# Patient Record
Sex: Female | Born: 1950 | Race: Asian | Hispanic: No | Marital: Married | Smoking: Never smoker
Health system: Southern US, Community
[De-identification: ages and names within clinical notes are randomized; demographics above are authoritative.]

## PROBLEM LIST (undated history)

## (undated) DIAGNOSIS — G4700 Insomnia, unspecified: Secondary | ICD-10-CM

## (undated) DIAGNOSIS — F419 Anxiety disorder, unspecified: Secondary | ICD-10-CM

## (undated) DIAGNOSIS — R748 Abnormal levels of other serum enzymes: Secondary | ICD-10-CM

## (undated) DIAGNOSIS — G43909 Migraine, unspecified, not intractable, without status migrainosus: Secondary | ICD-10-CM

## (undated) DIAGNOSIS — E785 Hyperlipidemia, unspecified: Secondary | ICD-10-CM

## (undated) DIAGNOSIS — R519 Headache, unspecified: Secondary | ICD-10-CM

## (undated) DIAGNOSIS — K219 Gastro-esophageal reflux disease without esophagitis: Secondary | ICD-10-CM

## (undated) DIAGNOSIS — N760 Acute vaginitis: Secondary | ICD-10-CM

## (undated) DIAGNOSIS — H9190 Unspecified hearing loss, unspecified ear: Secondary | ICD-10-CM

## (undated) DIAGNOSIS — I671 Cerebral aneurysm, nonruptured: Secondary | ICD-10-CM

## (undated) DIAGNOSIS — Z974 Presence of external hearing-aid: Secondary | ICD-10-CM

## (undated) DIAGNOSIS — K760 Fatty (change of) liver, not elsewhere classified: Secondary | ICD-10-CM

## (undated) DIAGNOSIS — R4584 Anhedonia: Secondary | ICD-10-CM

## (undated) DIAGNOSIS — Z973 Presence of spectacles and contact lenses: Secondary | ICD-10-CM

## (undated) DIAGNOSIS — M1990 Unspecified osteoarthritis, unspecified site: Secondary | ICD-10-CM

## (undated) DIAGNOSIS — I729 Aneurysm of unspecified site: Secondary | ICD-10-CM

## (undated) DIAGNOSIS — I2510 Atherosclerotic heart disease of native coronary artery without angina pectoris: Secondary | ICD-10-CM

## (undated) DIAGNOSIS — E039 Hypothyroidism, unspecified: Secondary | ICD-10-CM

## (undated) DIAGNOSIS — F32A Depression, unspecified: Secondary | ICD-10-CM

## (undated) DIAGNOSIS — I10 Essential (primary) hypertension: Secondary | ICD-10-CM

## (undated) HISTORY — DX: Gastro-esophageal reflux disease without esophagitis: K21.9

## (undated) HISTORY — DX: Migraine, unspecified, not intractable, without status migrainosus: G43.909

## (undated) HISTORY — DX: Fatty (change of) liver, not elsewhere classified: K76.0

## (undated) HISTORY — DX: Anhedonia: R45.84

## (undated) HISTORY — DX: Hypothyroidism, unspecified: E03.9

## (undated) HISTORY — DX: Insomnia, unspecified: G47.00

## (undated) HISTORY — DX: Unspecified osteoarthritis, unspecified site: M19.90

## (undated) HISTORY — DX: Cerebral aneurysm, nonruptured: I67.1

## (undated) HISTORY — DX: Hyperlipidemia, unspecified: E78.5

## (undated) HISTORY — DX: Acute vaginitis: N76.0

## (undated) HISTORY — DX: Unspecified hearing loss, unspecified ear: H91.90

## (undated) HISTORY — DX: Abnormal levels of other serum enzymes: R74.8

## (undated) HISTORY — DX: Essential (primary) hypertension: I10

## (undated) HISTORY — DX: Depression, unspecified: F32.A

## (undated) HISTORY — PX: BREAST BIOPSY: SHX20

---

## 2001-07-22 ENCOUNTER — Encounter: Payer: Self-pay | Admitting: Family Medicine

## 2001-07-22 ENCOUNTER — Ambulatory Visit (HOSPITAL_COMMUNITY): Admission: RE | Admit: 2001-07-22 | Discharge: 2001-07-22 | Payer: Self-pay | Admitting: Family Medicine

## 2001-08-15 ENCOUNTER — Other Ambulatory Visit: Admission: RE | Admit: 2001-08-15 | Discharge: 2001-08-15 | Payer: Self-pay | Admitting: Family Medicine

## 2001-08-18 ENCOUNTER — Encounter: Payer: Self-pay | Admitting: Family Medicine

## 2001-08-18 ENCOUNTER — Ambulatory Visit (HOSPITAL_COMMUNITY): Admission: RE | Admit: 2001-08-18 | Discharge: 2001-08-18 | Payer: Self-pay | Admitting: Family Medicine

## 2003-07-14 ENCOUNTER — Ambulatory Visit (HOSPITAL_COMMUNITY): Admission: RE | Admit: 2003-07-14 | Discharge: 2003-07-14 | Payer: Self-pay | Admitting: Family Medicine

## 2003-07-14 ENCOUNTER — Encounter: Payer: Self-pay | Admitting: Family Medicine

## 2003-07-21 ENCOUNTER — Encounter: Payer: Self-pay | Admitting: Family Medicine

## 2003-07-21 ENCOUNTER — Ambulatory Visit (HOSPITAL_COMMUNITY): Admission: RE | Admit: 2003-07-21 | Discharge: 2003-07-21 | Payer: Self-pay | Admitting: Family Medicine

## 2004-09-06 ENCOUNTER — Ambulatory Visit: Payer: Self-pay | Admitting: Family Medicine

## 2004-09-07 ENCOUNTER — Ambulatory Visit (HOSPITAL_COMMUNITY): Admission: RE | Admit: 2004-09-07 | Discharge: 2004-09-07 | Payer: Self-pay | Admitting: Family Medicine

## 2004-09-13 ENCOUNTER — Ambulatory Visit (HOSPITAL_COMMUNITY): Admission: RE | Admit: 2004-09-13 | Discharge: 2004-09-13 | Payer: Self-pay | Admitting: Family Medicine

## 2004-09-21 ENCOUNTER — Ambulatory Visit: Payer: Self-pay | Admitting: Family Medicine

## 2004-10-26 ENCOUNTER — Ambulatory Visit: Payer: Self-pay | Admitting: Family Medicine

## 2004-11-02 ENCOUNTER — Ambulatory Visit (HOSPITAL_COMMUNITY): Admission: RE | Admit: 2004-11-02 | Discharge: 2004-11-02 | Payer: Self-pay | Admitting: Family Medicine

## 2005-01-22 ENCOUNTER — Ambulatory Visit: Payer: Self-pay | Admitting: Family Medicine

## 2005-03-05 ENCOUNTER — Ambulatory Visit: Payer: Self-pay | Admitting: Family Medicine

## 2005-04-05 ENCOUNTER — Ambulatory Visit (HOSPITAL_COMMUNITY): Admission: RE | Admit: 2005-04-05 | Discharge: 2005-04-05 | Payer: Self-pay | Admitting: General Surgery

## 2005-04-25 ENCOUNTER — Ambulatory Visit (HOSPITAL_COMMUNITY): Admission: RE | Admit: 2005-04-25 | Discharge: 2005-04-25 | Payer: Self-pay | Admitting: Family Medicine

## 2005-04-26 ENCOUNTER — Encounter: Admission: RE | Admit: 2005-04-26 | Discharge: 2005-04-26 | Payer: Self-pay | Admitting: Family Medicine

## 2005-04-26 ENCOUNTER — Encounter (INDEPENDENT_AMBULATORY_CARE_PROVIDER_SITE_OTHER): Payer: Self-pay | Admitting: *Deleted

## 2005-09-27 ENCOUNTER — Ambulatory Visit: Payer: Self-pay | Admitting: Family Medicine

## 2005-10-02 ENCOUNTER — Ambulatory Visit: Payer: Self-pay | Admitting: Family Medicine

## 2005-11-27 ENCOUNTER — Ambulatory Visit: Payer: Self-pay | Admitting: Family Medicine

## 2005-12-10 ENCOUNTER — Ambulatory Visit: Payer: Self-pay | Admitting: Family Medicine

## 2005-12-11 ENCOUNTER — Ambulatory Visit: Payer: Self-pay | Admitting: Family Medicine

## 2005-12-26 ENCOUNTER — Ambulatory Visit: Payer: Self-pay | Admitting: Family Medicine

## 2006-01-09 ENCOUNTER — Ambulatory Visit (HOSPITAL_COMMUNITY): Admission: RE | Admit: 2006-01-09 | Discharge: 2006-01-09 | Payer: Self-pay

## 2006-01-28 ENCOUNTER — Ambulatory Visit: Payer: Self-pay | Admitting: Family Medicine

## 2006-08-14 ENCOUNTER — Ambulatory Visit: Payer: Self-pay | Admitting: Family Medicine

## 2006-09-05 ENCOUNTER — Other Ambulatory Visit: Admission: RE | Admit: 2006-09-05 | Discharge: 2006-09-05 | Payer: Self-pay | Admitting: Family Medicine

## 2006-09-05 ENCOUNTER — Ambulatory Visit: Payer: Self-pay | Admitting: Family Medicine

## 2006-12-19 ENCOUNTER — Ambulatory Visit: Payer: Self-pay | Admitting: Family Medicine

## 2006-12-20 ENCOUNTER — Encounter: Payer: Self-pay | Admitting: Family Medicine

## 2006-12-20 LAB — CONVERTED CEMR LAB: TSH: 1.563 microintl units/mL (ref 0.350–5.50)

## 2007-01-13 ENCOUNTER — Ambulatory Visit (HOSPITAL_COMMUNITY): Admission: RE | Admit: 2007-01-13 | Discharge: 2007-01-13 | Payer: Self-pay | Admitting: Family Medicine

## 2007-04-01 ENCOUNTER — Ambulatory Visit: Payer: Self-pay | Admitting: Family Medicine

## 2007-04-01 LAB — CONVERTED CEMR LAB: TSH: 0.923 microintl units/mL (ref 0.350–5.50)

## 2007-07-15 ENCOUNTER — Ambulatory Visit: Payer: Self-pay | Admitting: Family Medicine

## 2007-07-16 ENCOUNTER — Encounter: Payer: Self-pay | Admitting: Family Medicine

## 2007-09-11 ENCOUNTER — Other Ambulatory Visit: Admission: RE | Admit: 2007-09-11 | Discharge: 2007-09-11 | Payer: Self-pay | Admitting: Family Medicine

## 2007-09-11 ENCOUNTER — Encounter: Payer: Self-pay | Admitting: Family Medicine

## 2007-09-11 ENCOUNTER — Ambulatory Visit: Payer: Self-pay | Admitting: Family Medicine

## 2007-09-11 LAB — CONVERTED CEMR LAB
Basophils Absolute: 0.1 10*3/uL (ref 0.0–0.1)
Basophils Relative: 1 % (ref 0–1)
Chloride: 106 meq/L (ref 96–112)
Creatinine, Ser: 0.68 mg/dL (ref 0.40–1.20)
HDL: 60 mg/dL (ref >39)
LDL Cholesterol: 150 mg/dL — ABNORMAL HIGH (ref 0–99)
MCHC: 33.1 g/dL (ref 30.0–36.0)
Monocytes Absolute: 0.5 10*3/uL (ref 0.1–1.0)
Neutro Abs: 2.4 10*3/uL (ref 1.7–7.7)
Neutrophils Relative %: 52 % (ref 43–77)
Platelets: 340 10*3/uL (ref 150–400)
RDW: 14 % (ref 11.5–15.5)
Sodium: 144 meq/L (ref 135–145)
Triglycerides: 102 mg/dL (ref <150)

## 2007-10-02 ENCOUNTER — Encounter: Payer: Self-pay | Admitting: Family Medicine

## 2008-02-25 ENCOUNTER — Ambulatory Visit (HOSPITAL_COMMUNITY): Admission: RE | Admit: 2008-02-25 | Discharge: 2008-02-25 | Payer: Self-pay | Admitting: Family Medicine

## 2008-03-31 ENCOUNTER — Ambulatory Visit: Payer: Self-pay | Admitting: Family Medicine

## 2008-04-01 ENCOUNTER — Encounter: Payer: Self-pay | Admitting: Family Medicine

## 2008-04-01 LAB — CONVERTED CEMR LAB
TSH: 2.916 microintl units/mL (ref 0.350–4.50)
Total CHOL/HDL Ratio: 3.8
VLDL: 14 mg/dL (ref 0–40)

## 2008-04-02 ENCOUNTER — Encounter: Payer: Self-pay | Admitting: Family Medicine

## 2008-04-02 LAB — CONVERTED CEMR LAB
Candida species: NEGATIVE
Chlamydia, DNA Probe: NEGATIVE

## 2008-04-05 ENCOUNTER — Encounter: Payer: Self-pay | Admitting: Family Medicine

## 2008-04-05 LAB — CONVERTED CEMR LAB
ALT: 13 units/L (ref 0–35)
Alkaline Phosphatase: 60 units/L (ref 39–117)
Total Protein: 7.1 g/dL (ref 6.0–8.3)

## 2008-04-09 ENCOUNTER — Encounter: Payer: Self-pay | Admitting: Family Medicine

## 2008-04-09 DIAGNOSIS — E039 Hypothyroidism, unspecified: Secondary | ICD-10-CM | POA: Insufficient documentation

## 2008-04-09 DIAGNOSIS — F458 Other somatoform disorders: Secondary | ICD-10-CM | POA: Insufficient documentation

## 2008-04-09 DIAGNOSIS — N3000 Acute cystitis without hematuria: Secondary | ICD-10-CM

## 2008-04-09 DIAGNOSIS — I10 Essential (primary) hypertension: Secondary | ICD-10-CM

## 2008-04-09 DIAGNOSIS — G4700 Insomnia, unspecified: Secondary | ICD-10-CM | POA: Insufficient documentation

## 2008-04-09 DIAGNOSIS — N760 Acute vaginitis: Secondary | ICD-10-CM | POA: Insufficient documentation

## 2008-04-09 DIAGNOSIS — E785 Hyperlipidemia, unspecified: Secondary | ICD-10-CM

## 2008-05-05 ENCOUNTER — Encounter: Payer: Self-pay | Admitting: Family Medicine

## 2008-06-17 ENCOUNTER — Ambulatory Visit: Payer: Self-pay | Admitting: Family Medicine

## 2008-06-17 DIAGNOSIS — L259 Unspecified contact dermatitis, unspecified cause: Secondary | ICD-10-CM

## 2008-07-05 ENCOUNTER — Telehealth: Payer: Self-pay | Admitting: Family Medicine

## 2008-07-07 ENCOUNTER — Encounter: Payer: Self-pay | Admitting: Family Medicine

## 2008-07-09 ENCOUNTER — Telehealth: Payer: Self-pay | Admitting: Family Medicine

## 2008-07-13 ENCOUNTER — Telehealth: Payer: Self-pay | Admitting: Family Medicine

## 2008-07-14 ENCOUNTER — Encounter: Payer: Self-pay | Admitting: Family Medicine

## 2008-08-09 ENCOUNTER — Ambulatory Visit: Payer: Self-pay | Admitting: Family Medicine

## 2008-08-09 DIAGNOSIS — L049 Acute lymphadenitis, unspecified: Secondary | ICD-10-CM | POA: Insufficient documentation

## 2008-08-09 DIAGNOSIS — R519 Headache, unspecified: Secondary | ICD-10-CM | POA: Insufficient documentation

## 2008-08-09 DIAGNOSIS — R51 Headache: Secondary | ICD-10-CM

## 2008-08-15 DIAGNOSIS — E739 Lactose intolerance, unspecified: Secondary | ICD-10-CM

## 2008-08-16 ENCOUNTER — Encounter: Payer: Self-pay | Admitting: Family Medicine

## 2008-09-02 ENCOUNTER — Ambulatory Visit (HOSPITAL_COMMUNITY): Admission: RE | Admit: 2008-09-02 | Discharge: 2008-09-02 | Payer: Self-pay | Admitting: Family Medicine

## 2008-09-02 ENCOUNTER — Ambulatory Visit: Payer: Self-pay | Admitting: Family Medicine

## 2008-09-02 DIAGNOSIS — J42 Unspecified chronic bronchitis: Secondary | ICD-10-CM

## 2009-01-05 ENCOUNTER — Ambulatory Visit: Payer: Self-pay | Admitting: Family Medicine

## 2009-01-05 LAB — CONVERTED CEMR LAB
Bilirubin Urine: NEGATIVE
Ketones, urine, test strip: NEGATIVE
Protein, U semiquant: >=300
Urobilinogen, UA: 0.2

## 2009-01-06 ENCOUNTER — Encounter (INDEPENDENT_AMBULATORY_CARE_PROVIDER_SITE_OTHER): Payer: Self-pay | Admitting: Family Medicine

## 2009-01-25 ENCOUNTER — Encounter: Payer: Self-pay | Admitting: Family Medicine

## 2009-03-01 ENCOUNTER — Ambulatory Visit: Payer: Self-pay | Admitting: Family Medicine

## 2009-03-01 ENCOUNTER — Encounter: Payer: Self-pay | Admitting: Family Medicine

## 2009-03-01 ENCOUNTER — Other Ambulatory Visit: Admission: RE | Admit: 2009-03-01 | Discharge: 2009-03-01 | Payer: Self-pay | Admitting: Family Medicine

## 2009-03-01 DIAGNOSIS — R079 Chest pain, unspecified: Secondary | ICD-10-CM

## 2009-03-02 ENCOUNTER — Encounter: Payer: Self-pay | Admitting: Family Medicine

## 2009-03-02 LAB — CONVERTED CEMR LAB
Gardnerella vaginalis: NEGATIVE
Trichomonal Vaginitis: NEGATIVE

## 2009-03-04 LAB — CONVERTED CEMR LAB
ALT: 14 units/L (ref 0–35)
AST: 22 units/L (ref 0–37)
Alkaline Phosphatase: 53 units/L (ref 39–117)
BUN: 19 mg/dL (ref 6–23)
Basophils Absolute: 0 10*3/uL (ref 0.0–0.1)
Calcium: 9.4 mg/dL (ref 8.4–10.5)
Cholesterol: 185 mg/dL (ref 0–200)
Eosinophils Relative: 3 % (ref 0–5)
Indirect Bilirubin: 0.3 mg/dL (ref 0.0–0.9)
Lymphocytes Relative: 33 % (ref 12–46)
Neutro Abs: 2.6 10*3/uL (ref 1.7–7.7)
Platelets: 347 10*3/uL (ref 150–400)
Potassium: 3.3 meq/L — ABNORMAL LOW (ref 3.5–5.3)
RDW: 13.4 % (ref 11.5–15.5)
Total Protein: 7.2 g/dL (ref 6.0–8.3)
Triglycerides: 87 mg/dL (ref <150)
VLDL: 17 mg/dL (ref 0–40)

## 2009-08-19 ENCOUNTER — Ambulatory Visit: Payer: Self-pay | Admitting: Family Medicine

## 2009-08-19 LAB — CONVERTED CEMR LAB
Glucose, Urine, Semiquant: NEGATIVE
Nitrite: NEGATIVE
Protein, U semiquant: NEGATIVE
Urobilinogen, UA: 0.2

## 2009-08-20 ENCOUNTER — Encounter: Payer: Self-pay | Admitting: Family Medicine

## 2009-08-26 ENCOUNTER — Ambulatory Visit (HOSPITAL_COMMUNITY): Admission: RE | Admit: 2009-08-26 | Discharge: 2009-08-26 | Payer: Self-pay | Admitting: Family Medicine

## 2009-08-29 ENCOUNTER — Encounter: Payer: Self-pay | Admitting: Family Medicine

## 2009-09-09 ENCOUNTER — Ambulatory Visit: Payer: Self-pay | Admitting: Family Medicine

## 2009-09-09 DIAGNOSIS — H547 Unspecified visual loss: Secondary | ICD-10-CM

## 2009-09-09 LAB — CONVERTED CEMR LAB
Blood in Urine, dipstick: NEGATIVE
Ketones, urine, test strip: NEGATIVE
Nitrite: NEGATIVE
Protein, U semiquant: NEGATIVE
Urobilinogen, UA: 0.2
WBC Urine, dipstick: NEGATIVE

## 2009-09-20 ENCOUNTER — Encounter: Payer: Self-pay | Admitting: Family Medicine

## 2009-12-16 ENCOUNTER — Ambulatory Visit: Payer: Self-pay | Admitting: Family Medicine

## 2009-12-16 DIAGNOSIS — K219 Gastro-esophageal reflux disease without esophagitis: Secondary | ICD-10-CM

## 2009-12-19 ENCOUNTER — Encounter: Payer: Self-pay | Admitting: Family Medicine

## 2009-12-23 ENCOUNTER — Ambulatory Visit: Payer: Self-pay | Admitting: Family Medicine

## 2010-01-26 ENCOUNTER — Ambulatory Visit: Payer: Self-pay | Admitting: Family Medicine

## 2010-01-26 DIAGNOSIS — J309 Allergic rhinitis, unspecified: Secondary | ICD-10-CM | POA: Insufficient documentation

## 2010-01-31 ENCOUNTER — Telehealth: Payer: Self-pay | Admitting: Physician Assistant

## 2010-04-12 ENCOUNTER — Ambulatory Visit: Payer: Self-pay | Admitting: Gastroenterology

## 2010-04-12 DIAGNOSIS — K5900 Constipation, unspecified: Secondary | ICD-10-CM | POA: Insufficient documentation

## 2010-04-12 DIAGNOSIS — K3189 Other diseases of stomach and duodenum: Secondary | ICD-10-CM

## 2010-04-12 DIAGNOSIS — R1013 Epigastric pain: Secondary | ICD-10-CM

## 2010-04-14 ENCOUNTER — Encounter: Payer: Self-pay | Admitting: Gastroenterology

## 2010-04-17 ENCOUNTER — Ambulatory Visit (HOSPITAL_COMMUNITY): Admission: RE | Admit: 2010-04-17 | Discharge: 2010-04-17 | Payer: Self-pay | Admitting: Gastroenterology

## 2010-04-17 ENCOUNTER — Ambulatory Visit: Payer: Self-pay | Admitting: Gastroenterology

## 2010-04-20 ENCOUNTER — Encounter: Payer: Self-pay | Admitting: Gastroenterology

## 2010-06-06 ENCOUNTER — Telehealth: Payer: Self-pay | Admitting: Family Medicine

## 2010-07-13 ENCOUNTER — Encounter (INDEPENDENT_AMBULATORY_CARE_PROVIDER_SITE_OTHER): Payer: Self-pay | Admitting: *Deleted

## 2010-08-18 ENCOUNTER — Other Ambulatory Visit: Admission: RE | Admit: 2010-08-18 | Discharge: 2010-08-18 | Payer: Self-pay | Admitting: Family Medicine

## 2010-08-18 ENCOUNTER — Ambulatory Visit: Payer: Self-pay | Admitting: Family Medicine

## 2010-08-19 ENCOUNTER — Encounter: Payer: Self-pay | Admitting: Family Medicine

## 2010-08-20 DIAGNOSIS — L299 Pruritus, unspecified: Secondary | ICD-10-CM | POA: Insufficient documentation

## 2010-08-22 ENCOUNTER — Encounter: Payer: Self-pay | Admitting: Family Medicine

## 2010-08-22 LAB — CONVERTED CEMR LAB: Pap Smear: NEGATIVE

## 2010-08-28 ENCOUNTER — Telehealth: Payer: Self-pay | Admitting: Family Medicine

## 2010-08-29 ENCOUNTER — Telehealth: Payer: Self-pay | Admitting: Family Medicine

## 2010-08-29 ENCOUNTER — Ambulatory Visit (HOSPITAL_COMMUNITY): Admission: RE | Admit: 2010-08-29 | Discharge: 2010-08-29 | Payer: Self-pay | Admitting: Family Medicine

## 2010-10-22 ENCOUNTER — Encounter: Payer: Self-pay | Admitting: Family Medicine

## 2010-10-29 LAB — CONVERTED CEMR LAB
Chlamydia, DNA Probe: NEGATIVE
GC Probe Amp, Genital: NEGATIVE

## 2010-10-31 NOTE — Assessment & Plan Note (Signed)
Summary: blood pressure follow up- room 2   Vital Signs:  Patient profile:   60 year old female Menstrual status:  perimenopausal Height:      62.5 inches Weight:      142.50 pounds BMI:     25.74 O2 Sat:      97 % on Room air Pulse rate:   85 / minute Resp:     16 per minute BP sitting:   164 / 90  (left arm)  Vitals Entered By: Adella Hare LPN (December 23, 2009 8:47 AM) CC: blood pressure follow up Is Patient Diabetic? No Pain Assessment Patient in pain? no        Primary Provider:  Syliva Overman MD  CC:  blood pressure follow up.  History of Present Illness: Pt is here today for f/u of your blood pressure.   she brought her monitor with her today, & her BP on her monitor was close to the BP reading in the office. She also brought her daily BP reading from home x the last 1 week.  BP                     P 12/76               84 115/79             92 124/80             90 120/76             79 114/78             86 108/82             76   Pt denies chest pain since her prev visit. Has an appt with GI 4/1.   "I have heartburn every day."  But is better with meds than w/o.  Current Medications (verified): 1)  Nasonex 50 Mcg/act Susp (Mometasone Furoate) .... Two Puffs Per Nostril Qd 2)  Maxzide-25 37.5-25 Mg Tabs (Triamterene-Hctz) .... Take 1 Tablet By Mouth Once A Day 3)  Temazepam 15 Mg Caps (Temazepam) .... Take 1 Tab By Mouth At Bedtime 4)  Klor-Con M20 20 Meq Cr-Tabs (Potassium Chloride Crys Cr) .... One Tab By Mouth Qd 5)  Calcium-Magnesium-Zinc 333-133-8.3 Mg Tabs (Calcium-Magnesium-Zinc) .... Take 1 Tablet By Mouth Once A Day 6)  Vitamin C 1000 Mg Tabs (Ascorbic Acid) .... Take 1 Tablet By Mouth Once A Day 7)  Fish Oil 1000 Mg Caps (Omega-3 Fatty Acids) .... Take 1 Tablet By Mouth Once A Day 8)  Aspir-Low 81 Mg Tbec (Aspirin) .... Take 1 Tablet By Mouth Once A Day 9)  Vitamin E 400 Unit Caps (Vitamin E) .... Take 1 Tablet By Mouth Once A Day 10)   Imipramine Hcl 10 Mg Tabs (Imipramine Hcl) .... Take 1 Tab By Mouth At Bedtime 11)  Omeprazole 20 Mg Cpdr (Omeprazole) .... Take 1 Capsule By Mouth Once A Day 12)  Hydroxyzine Hcl 25 Mg Tabs (Hydroxyzine Hcl) .... Take One Every 8 Hrs As Needed For Itching  Allergies (verified): No Known Drug Allergies  Past History:  Past medical history reviewed for relevance to current acute and chronic problems.  Past Medical History: Reviewed history from 12/16/2009 and no changes required. 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 CV:  Denies chest pain or discomfort and palpitations. Resp:  Denies shortness of breath. GI:  Complains of indigestion; denies abdominal pain, nausea, and vomiting.  Physical Exam  General:  Well-developed,well-nourished,in no acute distress; alert,appropriate and cooperative throughout examination Head:  Normocephalic and atraumatic without obvious abnormalities. No apparent alopecia or balding. Ears:  External ear exam shows no significant lesions or deformities.  Otoscopic examination reveals clear canals, tympanic membranes are intact bilaterally without bulging, retraction, inflammation or discharge. Hearing is grossly normal bilaterally. Nose:  External nasal examination shows no deformity or inflammation. Nasal mucosa are pink and moist without lesions or exudates. Mouth:  Oral mucosa and oropharynx without lesions or exudates.  Teeth in good repair. Neck:  No deformities, masses, or tenderness noted. Lungs:  Normal respiratory effort, chest expands symmetrically. Lungs are clear to auscultation, no crackles or wheezes. Heart:  Normal rate and regular rhythm. S1 and S2 normal without gallop, murmur, click, rub or other extra sounds. Cervical Nodes:  No lymphadenopathy  noted Psych:  Cognition and judgment appear intact. Alert and cooperative with normal attention span and concentration. No apparent delusions, illusions, hallucinations   Impression & Recommendations:  Problem # 1:  HYPERTENSION (ICD-401.9) Assessment Improved Pts BP is elevated in the office but is controlled at home. No adjustments to meds at this time.  Her updated medication list for this problem includes:    Maxzide-25 37.5-25 Mg Tabs (Triamterene-hctz) .Marland Kitchen... Take 1 tablet by mouth once a day  Her updated medication list for this problem includes:    Maxzide-25 37.5-25 Mg Tabs (Triamterene-hctz) .Marland Kitchen... Take 1 tablet by mouth once a day  Problem # 2:  GERD (ICD-530.81) Assessment: Unchanged  Her updated medication list for this problem includes:    Omeprazole 20 Mg Cpdr (Omeprazole) .Marland Kitchen... Take 1 capsule by mouth once a day  Her updated medication list for this problem includes:    Omeprazole 20 Mg Cpdr (Omeprazole) .Marland Kitchen... Take 1 capsule by mouth once a day  Complete Medication List: 1)  Nasonex 50 Mcg/act Susp (Mometasone furoate) .... Two puffs per nostril qd 2)  Maxzide-25 37.5-25 Mg Tabs (Triamterene-hctz) .... Take 1 tablet by mouth once a day 3)  Temazepam 15 Mg Caps (Temazepam) .... Take 1 tab by mouth at bedtime 4)  Klor-con M20 20 Meq Cr-tabs (Potassium chloride crys cr) .... One tab by mouth qd 5)  Calcium-magnesium-zinc 333-133-8.3 Mg Tabs (Calcium-magnesium-zinc) .... Take 1 tablet by mouth once a day 6)  Vitamin C 1000 Mg Tabs (Ascorbic acid) .... Take 1 tablet by mouth once a day 7)  Fish Oil 1000 Mg Caps (Omega-3 fatty acids) .... Take 1 tablet by mouth once a day 8)  Aspir-low 81 Mg Tbec (Aspirin) .... Take 1 tablet by mouth once a day 9)  Vitamin E 400 Unit Caps (Vitamin e) .... Take 1 tablet by mouth once a day 10)  Imipramine Hcl 10 Mg Tabs (Imipramine hcl) .... Take 1 tab by mouth at bedtime 11)  Omeprazole 20 Mg Cpdr (Omeprazole) .... Take 1 capsule by mouth  once a day 12)  Hydroxyzine Hcl 25 Mg Tabs (Hydroxyzine hcl) .... Take one every 8 hrs as needed for itching  Patient Instructions: 1)  Please schedule a follow-up appointment in 3 months. 2)  Check your Blood Pressure regularly. If it is above 160/100 at home you should make an appointment. 3)  Continue your current medications. 4)  Keep your appt with the GI doctor.

## 2010-10-31 NOTE — Letter (Signed)
Summary: Pap Smear, Normal Letter, [facility]  [address]   [place], [place] [address]   Phone: [phone]  Fax: [phone]          August 22, 2010    Dear: Tanya [name]    I am pleased to notify you that your PAP smear was normal.  You will need your next PAP smear in:     ____ 3 Months    ____ 6 Months    ____ 12 Months    Please call the office at our office number above, to schedule your next appointment.    Sincerely,     [facility]

## 2010-10-31 NOTE — Progress Notes (Signed)
  Phone Note Call from Patient   Caller: Patient Summary of Call: patient states dr didnt prescribe anything for her fungal infection [facility]co Initial call taken by: Adella Hare LPN,  August 29, 2010 9:08 AM  Follow-up for Phone Call        pls explain thwe generalised itch is due to allergies and tablet prescribed for this, no sign of fungal skin infection on exam Follow-up by: Syliva Overman MD,  August 29, 2010 12:25 PM  Additional Follow-up for Phone Call Additional follow up Details #1::        called patient, left message Additional Follow-up by: Adella Hare LPN,  August 29, 2010 5:32 PM    Additional Follow-up for Phone Call Additional follow up Details #2::    called patient, left message Follow-up by: Adella Hare LPN,  August 30, 2010 3:59 PM   Appended Document:  patient aware

## 2010-10-31 NOTE — Assessment & Plan Note (Signed)
Summary: DYSPEPSIA, CONSTIPATION   Visit Type:  Follow-up Visit Primary Care Provider:  Lodema Hong, M.D.   Chief Complaint:  gerd and constipation.  History of Present Illness: Gas and heartburn x4-6 mos. BMs: can have none for 2-3 days, straining, and hard balls. Sometimes uses Vaseline in rectum. Heartburn: in epigastrium associated with severe nausea, no vomiting, pain: chest and epigastrium. Taking Prilosec 1-3 times a week,  and it helps most of the time: ~3 mos. uses OTC TUMS and it helps better. No EGD but had TCS 6 years ago. No problems swallowing, blood in stool, or black tarry stools. Drinks water during day. Eats fruits and vegetables.  Constipation a problem for months. 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 Onset is random. Worse in the Am when she eats breakfast. ASA every day:  > 1 years for stroke prevention. Uses Advil sometimes: 2x/month. Worried about gas and constipation. Rare regugritates acid in the middle of the night.  Current Medications (verified): 1)  Maxzide-25 37.5-25 Mg Tabs (Triamterene-Hctz) .... Take 1 Tablet By Mouth Once A Day 2)  Temazepam 15 Mg Caps (Temazepam) .... Take 1 Tab By Mouth At Bedtime 3)  Vitamin C 1000 Mg Tabs (Ascorbic Acid) .... Take 1 Tablet By Mouth Once A Day 4)  Fish Oil 1000 Mg Caps (Omega-3 Fatty Acids) .... Take 1 Tablet By Mouth Once A Day 5)  Aspir-Low 81 Mg Tbec (Aspirin) .... Take 1 Tablet By Mouth Once A Day 6)  Vitamin E 400 Unit Caps (Vitamin E) .... Take 1 Tablet By Mouth Once A Day 7)  Omeprazole 20 Mg Cpdr (Omeprazole) .... 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 No kids. Originally from [place]: in [place] for  ~ 20 years Never Smoked Alcohol use-no Drug use-no  Vital Signs:  Patient profile:   60 year old female Menstrual status:  perimenopausal Height:      62.5 inches Weight:      135 pounds BMI:     24.39 Temp:     98.9 degrees F oral Pulse rate:   80 / minute BP sitting:   118 / 88  (left arm) Cuff size:   regular  Vitals Entered By: Hendricks Limes LPN (April 12, 2010 3:37 PM)  Physical Exam  General:  Well developed, well nourished, no acute distress. Head:  Normocephalic and atraumatic. Eyes:  PERRLA, no icterus. Mouth:  No deformity or lesions. Neck:  Supple; no masses. Lungs:  Clear throughout to auscultation. Heart:  Regular rate and rhythm; no murmurs. Abdomen:  Soft, nontender and nondistended. No masses, or hernias noted. Normal bowel sounds. Extremities:  No edema or deformities noted. Neurologic:  Alert and  oriented x4;  grossly normal neurologically.  Impression & Recommendations:  Problem # 1:  DYSPEPSIA (ICD-536.8) Assessment New Differential diagnosis includes H. pylori or NSAID gastritis, PUD, doubt gastric CA. EGD MON 7/18. OPV in 3 mos.  Problem # 2:  CONSTIPATION (ICD-564.00) Assessment: Unchanged Pt already on a high fiber diet and drinks water. Add Amitiza 8 micrograms  two times a day. Samples given #12. OPV in 2 mos. Obtain TCS report from [facility]. Called in 90 day supply for Amitiza to [phone], rfx3.  CC: PCP Prescriptions: AMITIZA 8 MCG CAPS (LUBIPROSTONE) 1 by mouth at bedtime for 7 days then then by mouth two times a day. Take with food or juice  #60 x 0   Entered and Authorized by:   West Bali MD   Signed by:   West Bali MD on 04/12/2010   Method used:   Electronically to          [facility] (retail)       [address]       [place], [place]  [address]       Ph: [phone]       Fax: [phone]   RxID:   [id number]   Appended Document: Orders Update    Clinical Lists Changes  Orders: Added new Service order of Consultation Level III [phone]) - Signed

## 2010-10-31 NOTE — Letter (Signed)
Summary: OFFICE NOTES  OFFICE NOTES   Imported By: Lind Guest 03/27/2010 10:19:32  _____________________________________________________________________  External Attachment:    Type:   Image     Comment:   External Document

## 2010-10-31 NOTE — Letter (Signed)
Summary: DEMO  DEMO   Imported By: Lind Guest 03/27/2010 10:17:48  _____________________________________________________________________  External Attachment:    Type:   Image     Comment:   External Document

## 2010-10-31 NOTE — Letter (Signed)
Summary: Internal Other /EGD order  Internal Other /EGD order   Imported By: Cloria Spring LPN 95/28/4132 44:01:02  _____________________________________________________________________  External Attachment:    Type:   Image     Comment:   External Document

## 2010-10-31 NOTE — Letter (Signed)
Summary: Recall Office Visit  [facility]  [address]   [place], [place] [address]   Phone: [phone]  Fax: [phone]      July 13, 2010   OSCAR FORMAN [address] [place], [place]  [address] 11-28-1950   Dear Ms. Doebler,   According to our records, it is time for you to schedule a follow-up office visit with [place].   At your convenience, please call [phone] to schedule an office visit. If you have any questions, concerns, or feel that this letter is in error, we would appreciate your call.   Sincerely,    Diana Eves  [facility] Ph: [phone]   Fax: [phone]

## 2010-10-31 NOTE — Assessment & Plan Note (Signed)
Summary: congestion ROOM-1   Vital Signs:  Patient profile:   60 year old female Menstrual status:  perimenopausal Height:      62.5 inches Weight:      138.25 pounds BMI:     24.97 O2 Sat:      97 % Pulse rate:   85 / minute Resp:     16 per minute BP sitting:   130 / 82  (left arm) Cuff size:   regular  Vitals Entered By: Everitt Amber LPN (January 26, 2010 4:00 PM) CC: stuffy nose, dry cough, chest is sore. Feeling bad for over a week   Primary Provider:  Syliva Overman MD  CC:  stuffy nose, dry cough, and chest is sore. Feeling bad for over a week.  History of Present Illness: Pt is here today with c/o nasal congestion & nonprod cough x 1 week.  Her nasal mucus has been clear in color. Her cough is worse when she lies down. She has been using over the counter cold medications w/o much relief.  No fever or chills.  She has a hx of allergic rhinitis.  Has used Nasonex in the past & worked well for her.    Current Medications (verified): 1)  Nasonex 50 Mcg/act Susp (Mometasone Furoate) .... Two Puffs Per Nostril Qd 2)  Maxzide-25 37.5-25 Mg Tabs (Triamterene-Hctz) .... Take 1 Tablet By Mouth Once A Day 3)  Temazepam 15 Mg Caps (Temazepam) .... Take 1 Tab By Mouth At Bedtime 4)  Klor-Con M20 20 Meq Cr-Tabs (Potassium Chloride Crys Cr) .... One Tab By Mouth Qd 5)  Calcium-Magnesium-Zinc 333-133-8.3 Mg Tabs (Calcium-Magnesium-Zinc) .... Take 1 Tablet By Mouth Once A Day 6)  Vitamin C 1000 Mg Tabs (Ascorbic Acid) .... Take 1 Tablet By Mouth Once A Day 7)  Fish Oil 1000 Mg Caps (Omega-3 Fatty Acids) .... Take 1 Tablet By Mouth Once A Day 8)  Aspir-Low 81 Mg Tbec (Aspirin) .... Take 1 Tablet By Mouth Once A Day 9)  Vitamin E 400 Unit Caps (Vitamin E) .... Take 1 Tablet By Mouth Once A Day 10)  Imipramine Hcl 10 Mg Tabs (Imipramine Hcl) .... Take 1 Tab By Mouth At Bedtime 11)  Omeprazole 20 Mg Cpdr (Omeprazole) .... Take 1 Capsule By Mouth Once A Day 12)  Hydroxyzine Hcl 25 Mg Tabs  (Hydroxyzine Hcl) .... Take One Every 8 Hrs As Needed For Itching  Allergies (verified): No Known Drug Allergies  Past History:  Past medical history reviewed for relevance to current acute and chronic problems.  Past Medical History: Reviewed history from 12/16/2009 and no changes required. 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 ENT:  Complains of nasal congestion and postnasal drainage; denies earache, sinus pressure, and sore throat. Resp:  Complains of cough; denies shortness of breath and sputum productive. Allergy:  Complains of seasonal allergies and sneezing.  Physical Exam  General:  Well-developed,well-nourished,in no acute distress; alert,appropriate and cooperative throughout examination Head:  Normocephalic and atraumatic without obvious abnormalities. No apparent alopecia or balding. Ears:  External ear exam shows no significant lesions or deformities.  Otoscopic examination reveals clear canals, tympanic membranes are intact bilaterally without bulging, retraction, inflammation or discharge. Hearing is grossly normal bilaterally. Nose:  no external deformity.  Nasal tubs mod swollen & pale in color. Mouth:  Oral mucosa and oropharynx without lesions or exudates.  Teeth in good repair. Neck:  No deformities, masses, or tenderness noted. Lungs:  Normal respiratory effort, chest expands symmetrically. Lungs are clear to auscultation, no crackles or wheezes. Heart:  Normal rate and regular rhythm. S1 and S2 normal without gallop, murmur, click, rub or other extra sounds. Cervical Nodes:  No lymphadenopathy noted Psych:  Cognition and judgment appear intact. Alert and cooperative with normal attention span and concentration. No apparent delusions, illusions,  hallucinations   Impression & Recommendations:  Problem # 1:  ALLERGIC RHINITIS (ICD-477.9) Assessment Deteriorated  Her updated medication list for this problem includes:    Nasonex 50 Mcg/act Susp (Mometasone furoate) .Marland Kitchen..Marland Kitchen Two puffs per nostril qd    Fexofenadine Hcl 180 Mg Tabs (Fexofenadine hcl) .Marland Kitchen... Take 1 daily for allergies  Complete Medication List: 1)  Nasonex 50 Mcg/act Susp (Mometasone furoate) .... Two puffs per nostril qd 2)  Maxzide-25 37.5-25 Mg Tabs (Triamterene-hctz) .... Take 1 tablet by mouth once a day 3)  Temazepam 15 Mg Caps (Temazepam) .... Take 1 tab by mouth at bedtime 4)  Klor-con M20 20 Meq Cr-tabs (Potassium chloride crys cr) .... One tab by mouth qd 5)  Calcium-magnesium-zinc 333-133-8.3 Mg Tabs (Calcium-magnesium-zinc) .... Take 1 tablet by mouth once a day 6)  Vitamin C 1000 Mg Tabs (Ascorbic acid) .... Take 1 tablet by mouth once a day 7)  Fish Oil 1000 Mg Caps (Omega-3 fatty acids) .... Take 1 tablet by mouth once a day 8)  Aspir-low 81 Mg Tbec (Aspirin) .... Take 1 tablet by mouth once a day 9)  Vitamin E 400 Unit Caps (Vitamin e) .... Take 1 tablet by mouth once a day 10)  Imipramine Hcl 10 Mg Tabs (Imipramine hcl) .... Take 1 tab by mouth at bedtime 11)  Omeprazole 20 Mg Cpdr (Omeprazole) .... Take 1 capsule by mouth once a day 12)  Hydroxyzine Hcl 25 Mg Tabs (Hydroxyzine hcl) .... Take one every 8 hrs as needed for itching 13)  Fexofenadine Hcl 180 Mg Tabs (Fexofenadine hcl) .... Take 1 daily for allergies 14)  Benzonatate 100 Mg Caps (Benzonatate) .... Take 1-2 every 8 hrs as needed cough  Other Orders: Depo- Medrol 80mg  (J1040) Admin of Therapeutic Inj  intramuscular or subcutaneous (95638)  Patient Instructions: 1)  Get plenty of rest, drink lots of clear liquids, and use Tylenol or Ibuprofen for fever and comfort. Return in 7-10 days if you're not better:sooner if you're feeling worse. 2)  restart using Nasonex.  I have refilled this for  you. 3)  I have also prescribed something for cough and an allergy pill. 4)  You have received a shot of Depo Medrol to help with your congestion and allergies. Prescriptions: BENZONATATE 100 MG CAPS (BENZONATATE) take 1-2 every 8 hrs as needed cough  #30 x 0   Entered and Authorized by:   Esperanza Sheets PA   Signed by:   Esperanza Sheets PA on 01/26/2010   Method used:   Electronically to        [facility] (retail)       [address]       [place], [place]  [address]       Ph: [phone]       Fax: [phone]   RxID:   [id number] FEXOFENADINE HCL 180 MG TABS (FEXOFENADINE HCL) take 1 daily for allergies  #30 x 2   Entered and Authorized by:   Esperanza Sheets PA   Signed by:   Esperanza Sheets  PA on 01/26/2010   Method used:   Electronically to        [facility] (retail)       [address]       [place], [place]  [address]       Ph: [phone]       Fax: [phone]   RxID:   [id number] NASONEX 50 MCG/ACT SUSP (MOMETASONE FUROATE) TWO PUFFS PER NOSTRIL QD  #1 MONTH x 2   Entered and Authorized by:   Esperanza Sheets PA   Signed by:   Esperanza Sheets PA on 01/26/2010   Method used:   Electronically to        [facility] (retail)       [address]       [place], [place]  [address]       Ph: [phone]       Fax: [phone]   RxID:   [id number]    Medication Administration  Injection # 1:    Medication: Depo- Medrol 80mg     Diagnosis: OTHER CHRONIC BRONCHITIS (ICD-491.8)    Route: IM    Site: RUOQ gluteus    Exp Date: 08/2010    Lot #: [id number]    Mfr: [employer]    Comments: 80mg  given     Patient tolerated injection without complications    Given by: Everitt Amber LPN (January 26, 2010 5:12 PM)  Orders Added: 1)  Depo- Medrol 80mg  [J1040] 2)  Admin of Therapeutic Inj  intramuscular or subcutaneous [96[address]] 3)  Est. Patient Level IV [37106]

## 2010-10-31 NOTE — Letter (Signed)
Summary: HISTORY AND PHYSICAL  HISTORY AND PHYSICAL   Imported By: Lind Guest 03/27/2010 09:41:46  _____________________________________________________________________  External Attachment:    Type:   Image     Comment:   External Document

## 2010-10-31 NOTE — Op Note (Signed)
Summary: 2006 TCS  2006 TCS   Imported By: Minna Merritts 04/20/2010 16:57:49  _____________________________________________________________________  External Attachment:    Type:   Image     Comment:   External Document

## 2010-10-31 NOTE — Letter (Signed)
Summary: PHONE NOTES  PHONE NOTES   Imported By: Lind Guest 03/27/2010 10:20:03  _____________________________________________________________________  External Attachment:    Type:   Image     Comment:   External Document

## 2010-10-31 NOTE — Progress Notes (Signed)
  Phone Note Other Incoming   Caller: dr simpson Summary of Call: pls advise pt to follow a low fat diet , her cholesterol is slightly elevated, total is 219, should be less than 200, and bad is 130, should be less than 100, no meds at this time, all other labs are excellent Initial call taken by: Syliva Overman MD,  August 28, 2010 1:02 PM  Follow-up for Phone Call        called patient, left message Follow-up by: Adella Hare LPN,  August 28, 2010 4:37 PM  Additional Follow-up for Phone Call Additional follow up Details #1::        patient aware Additional Follow-up by: Adella Hare LPN,  August 29, 2010 9:06 AM

## 2010-10-31 NOTE — Progress Notes (Signed)
  Phone Note From Pharmacy   Caller: [facility] Summary of Call: refill request for hydrozyzine 25mg  one every 8 hours as needed for itching written in may, last dispensed in july should this be refilled? Initial call taken by: Adella Hare LPN,  June 06, 2010 4:06 PM    deny, her CPE is past due as isher labwork, needs these scheduled

## 2010-10-31 NOTE — Assessment & Plan Note (Signed)
Summary: physical   Vital Signs:  Patient profile:   60 year old female Menstrual status:  perimenopausal Height:      62.5 inches Weight:      142.25 pounds BMI:     25.70 O2 Sat:      98 % on Room air Pulse rate:   83 / minute Pulse rhythm:   regular Resp:     16 per minute BP sitting:   112 / 68  (left arm)  Vitals Entered By: Adella Hare LPN (August 18, 2010 8:53 AM)  Nutrition Counseling: Patient's BMI is greater than 25 and therefore counseled on weight management options.  O2 Flow:  Room air CC: physical Is Patient Diabetic? No Pain Assessment Patient in pain? no       Vision Screening:Left eye w/o correction: 20 / 30 Right Eye w/o correction: 20 / 50 Both eyes w/o correction:  20/ 25  Color vision testing: normal      Vision Entered By: Adella Hare LPN (August 18, 2010 11:27 AM)   Primary Care Provider:  Lodema Hong, M.D.   CC:  physical.  History of Present Illness: Reports  thatshe has been  doing well. Denies recent fever or chills. Denies sinus pressure, nasal congestion , ear pain or sore throat. Denies chest congestion, or cough productive of sputum. Denies chest pain, palpitations, PND, orthopnea or leg swelling. Denies abdominal pain, nausea, vomitting, diarrhea or constipation. Denies change in bowel movements or bloody stool. Denies dysuria , frequency, incontinence or hesitancy. Denies  joint pain, swelling, or reduced mobility. Denies headaches, vertigo, seizures. Denies depression, anxiety or insomnia. Denies  rash or , lesions,but is experiencing increased generalised  itch.     Current Medications (verified): 1)  Maxzide-25 37.5-25 Mg Tabs (Triamterene-Hctz) .... Take 1 Tablet By Mouth Once A Day 2)  Omeprazole 20 Mg Cpdr (Omeprazole) .... Take 1 Capsule By Mouth Once A Day  Allergies (verified): No Known Drug Allergies  Review of Systems      See HPI General:  Complains of sweats. Eyes:  Denies discharge, eye pain, and  red eye. Endo:  Denies cold intolerance, excessive hunger, excessive thirst, excessive urination, and heat intolerance. Heme:  Denies abnormal bruising and bleeding. Allergy:  Complains of seasonal allergies.  Physical Exam  General:  Well-developed,well-nourished,in no acute distress; alert,appropriate and cooperative throughout examination Head:  Normocephalic and atraumatic without obvious abnormalities. No apparent alopecia or balding. Eyes:  No corneal or conjunctival inflammation noted. EOMI. Perrla. Funduscopic exam benign, without hemorrhages, exudates or papilledema. Vision grossly normal. Ears:  External ear exam shows no significant lesions or deformities.  Otoscopic examination reveals clear canals, tympanic membranes are intact bilaterally without bulging, retraction, inflammation or discharge. Hearing is grossly normal bilaterally. Nose:  External nasal examination shows no deformity or inflammation. Nasal mucosa are pink and moist without lesions or exudates. Mouth:  Oral mucosa and oropharynx without lesions or exudates.  Teeth in good repair. Neck:  No deformities, masses, or tenderness noted. Chest Wall:  No deformities, masses, or tenderness noted. Breasts:  No mass, nodules, thickening, tenderness, bulging, retraction, inflamation, nipple discharge or skin changes noted.   Lungs:  Normal respiratory effort, chest expands symmetrically. Lungs are clear to auscultation, no crackles or wheezes. Heart:  Normal rate and regular rhythm. S1 and S2 normal without gallop, murmur, click, rub or other extra sounds. Abdomen:  Bowel sounds positive,abdomen soft and non-tender without masses, organomegaly or hernias noted. Rectal:  No external abnormalities noted. Normal sphincter tone.  No rectal masses or tenderness. Genitalia:  Normal introitus for age, no external lesions, no vaginal discharge, mucosa pink and moist, no vaginal or cervical lesions, no vaginal atrophy, no friaility or  hemorrhage, normal uterus size and position, no adnexal masses or tenderness Msk:  No deformity or scoliosis noted of thoracic or lumbar spine.   Pulses:  R and L carotid,radial,femoral,dorsalis pedis and posterior tibial pulses are full and equal bilaterally Extremities:  No clubbing, cyanosis, edema, or deformity noted with normal full range of motion of all joints.   Neurologic:  No cranial nerve deficits noted. Station and gait are normal. Plantar reflexes are down-going bilaterally. DTRs are symmetrical throughout. Sensory, motor and coordinative functions appear intact. Skin:  Intact without suspicious lesions or rashes Cervical Nodes:  No lymphadenopathy noted Axillary Nodes:  No palpable lymphadenopathy Inguinal Nodes:  No significant adenopathy Psych:  Cognition and judgment appear intact. Alert and cooperative with normal attention span and concentration. No apparent delusions, illusions, hallucinations   Impression & Recommendations:  Problem # 1:  PRURITUS (ICD-698.9) Assessment Deteriorated zyrtec prescribed  Problem # 2:  ALLERGIC RHINITIS (ICD-477.9) Assessment: Comment Only  Her updated medication list for this problem includes:    Zyrtec Allergy 10 Mg Caps (Cetirizine hcl) .Marland Kitchen... Take 1 capsule by mouth once a day  as needed for itching  Problem # 3:  HYPERLIPIDEMIA (ICD-272.4) Assessment: Comment Only  Orders: T-Lipid Profile ([id number]) T-Hepatic Function [phone])  Labs Reviewed: SGOT: 22 (03/02/2009)   SGPT: 14 (03/02/2009)   HDL:69 (03/02/2009), 59 (04/01/2008)  LDL:99 (03/02/2009), 154 (57/84/6962)  Chol:185 (03/02/2009), 227 (04/01/2008)  Trig:87 (03/02/2009), 69 (04/01/2008) therapeutic lifestyle change discussed and encouraged  Problem # 4:  GERD (ICD-530.81) Assessment: Improved  Her updated medication list for this problem includes:    Omeprazole 20 Mg Cpdr (Omeprazole) .Marland Kitchen... Take 1 capsule by mouth once a day  Problem # 5:  HYPERTENSION  (ICD-401.9) Assessment: Unchanged  Her updated medication list for this problem includes:    Maxzide-25 37.5-25 Mg Tabs (Triamterene-hctz) .Marland Kitchen... Take 1 tablet by mouth once a day  Orders: T-Basic Metabolic Panel [id number])  BP today: 112/68 Prior BP: 118/88 (04/12/2010)  Labs Reviewed: K+: 3.3 (03/02/2009) Creat: : 0.69 (03/02/2009)   Chol: 185 (03/02/2009)   HDL: 69 (03/02/2009)   LDL: 99 (03/02/2009)   TG: 87 (03/02/2009)  Complete Medication List: 1)  Maxzide-25 37.5-25 Mg Tabs (Triamterene-hctz) .... Take 1 tablet by mouth once a day 2)  Omeprazole 20 Mg Cpdr (Omeprazole) .... Take 1 capsule by mouth once a day 3)  Zyrtec Allergy 10 Mg Caps (Cetirizine hcl) .... Take 1 capsule by mouth once a day  as needed for itching  Other Orders: T-CBC w/Diff ([id number]) T-TSH [phone]) T-Vitamin D (25-Hydroxy) [phone]) Radiology Referral (Radiology) Hemoccult Guaiac-1 spec.(in office) (82270) Pap Smear (29518)  Patient Instructions: 1)  Please schedule a follow-up appointment in 4.5 to 5 months. 2)  BMP prior to visit, ICD-9: 3)  Hepatic Panel prior to visit, ICD-9: 4)  Lipid Panel prior to visit, ICD-9: 5)  TSH prior to visit, ICD-9: 6)  CBC w/ Diff prior to visit, ICD-9: 7)  vitamin d fasting in am 8)  new med for itching sent to your pharmacy 9)  you will get med for toenail fungal infection after I see your labs. 10)  Mamo will be scheduled eARLY mORNING Prescriptions: ZYRTEC ALLERGY 10 MG CAPS (CETIRIZINE HCL) Take 1 capsule by mouth once a day  as needed for itching  #90 x  1   Entered and Authorized by:   Syliva Overman MD   Signed by:   Syliva Overman MD on 08/18/2010   Method used:   Printed then faxed to ...       [facility] (mail-order)             , [place]         Ph: [phone]       Fax: [phone]   RxID:   [id number]    Orders Added: 1)  Est. Patient 40-64 years [99396] 2)  T-Basic Metabolic Panel [phone] 3)  T-Lipid Profile  [id number] 4)  T-Hepatic Function [80076-22960] 5)  T-CBC w/Diff [[id number]] 6)  T-TSH [59563-87564] 7)  T-Vitamin D (25-Hydroxy) [33295-18841] 8)  Radiology Referral [Radiology] 9)  Hemoccult Guaiac-1 spec.(in office) [82270] 10)  Pap Smear [88150]      Laboratory Results  Date/Time Received: August 18, 2010 11:27 AM  Date/Time Reported: August 18, 2010 11:27 AM   Stool - Occult Blood Hemmoccult #1: negative Date: 08/18/2010 Comments: 118 10/12 51301 13L 10/13 Adella Hare LPN  August 18, 2010 11:28 AM     Appended Document: physical pls note if pt has glasses or contacts, if she does not i need to know she will be referred for formal eye exam   Appended Document: physical does not have any corrective lenses wants eye referal in [place]  Appended Document: physical Prescriptions: ZYRTEC ALLERGY 10 MG CAPS (CETIRIZINE HCL) Take 1 capsule by mouth once a day  as needed for itching  #90 x 1   Entered by:   Adella Hare LPN   Authorized by:   Syliva Overman MD   Signed by:   Adella Hare LPN on 66/03/3015   Method used:   Electronically to        [facility]* (retail)             ,          Ph: [phone]       Fax: [phone]   RxID:   [id number] MAXZIDE-25 37.5-25 MG TABS (TRIAMTERENE-HCTZ) Take 1 tablet by mouth once a day  #90 x 1   Entered by:   Adella Hare LPN   Authorized by:   Syliva Overman MD   Signed by:   Adella Hare LPN on 73/71/0626   Method used:   Electronically to        [facility]* (retail)             ,          Ph: [phone]       Fax: [phone]   RxID:   [id number] OMEPRAZOLE 20 MG CPDR (OMEPRAZOLE) Take 1 capsule by mouth once a day  #90 x 1   Entered by:   Adella Hare LPN   Authorized by:   Syliva Overman MD   Signed by:   Adella Hare LPN on 51/11/5850   Method used:   Electronically to        [facility]* (retail)             ,          Ph: [phone]       Fax: [phone]   RxID:    [id number]

## 2010-10-31 NOTE — Letter (Signed)
Summary: LABS  LABS   Imported By: Lind Guest 03/27/2010 09:41:06  _____________________________________________________________________  External Attachment:    Type:   Image     Comment:   External Document

## 2010-10-31 NOTE — Letter (Signed)
Summary: X RAYS  X RAYS   Imported By: Lind Guest 03/27/2010 10:28:53  _____________________________________________________________________  External Attachment:    Type:   Image     Comment:   External Document

## 2010-10-31 NOTE — Progress Notes (Signed)
  Phone Note From Pharmacy   Caller: [facility]* Summary of Call: requesting refill on hydroxyzine Initial call taken by: Adella Hare LPN,  Jan 31, 1609 8:36 AM  Follow-up for Phone Call        Rx sent. Follow-up by: Esperanza Sheets PA,  Jan 31, 2010 8:41 AM    Prescriptions: HYDROXYZINE HCL 25 MG TABS (HYDROXYZINE HCL) take one every 8 hrs as needed for itching  #30 x 2   Entered and Authorized by:   Esperanza Sheets PA   Signed by:   Esperanza Sheets PA on 01/31/2010   Method used:   Electronically to        [facility]* (retail)       [address]       [place], [place]  [address]       Ph: [phone]       Fax: [phone]   RxID:   [id number]

## 2010-10-31 NOTE — Assessment & Plan Note (Signed)
Summary: chest pain - room 2   Vital Signs:  Patient profile:   60 year old female Menstrual status:  perimenopausal Height:      62.5 inches Weight:      144.25 pounds BMI:     26.06 O2 Sat:      96 % on Room air Pulse rate:   82 / minute Resp:     16 per minute BP sitting:   160 / 100  (left arm)  Vitals Entered By: Adella Hare LPN (December 16, 2009 10:10 AM)  Serial Vital Signs/Assessments:  Time      Position  BP       Pulse  Resp  Temp     By                     164/100                        Esperanza Sheets PA  CC: chest pain off and on Is Patient Diabetic? No Pain Assessment Patient in pain? no        Primary Provider:  Syliva Overman MD  CC:  chest pain off and on.  History of Present Illness: Pt is here today with c/o sternal chest pain today.   She has had this prev & no change.  (see prev visits re: this) No difficulty breathing, lightheadedness or diaphoresis.  No radiation.  Is not present currently.  Not assoc with exertion, meals, etc.  Pt states that she has daily heartburn syptoms despite being on omeprazole since Dec 2010.  She takes this regularly & has seeen no improv of her sxs.  Also has fluid brash occasionally.  BM's tend to be constipated.  No blood or melena.  Pt has hx of htn.  Does not check BP at home.  She is uncertain if her BP machine is accurate.  Her BP was well controlled at her prev visits.  Uncertain why elevated today. Pt states she took her medicine this am.  Current Medications (verified): 1)  Nasonex 50 Mcg/act Susp (Mometasone Furoate) .... Two Puffs Per Nostril Qd 2)  Maxzide-25 37.5-25 Mg Tabs (Triamterene-Hctz) .... Take 1 Tablet By Mouth Once A Day 3)  Temazepam 15 Mg Caps (Temazepam) .... Take 1 Tab By Mouth At Bedtime 4)  Klor-Con M20 20 Meq Cr-Tabs (Potassium Chloride Crys Cr) .... One Tab By Mouth Qd 5)  Calcium-Magnesium-Zinc 333-133-8.3 Mg Tabs (Calcium-Magnesium-Zinc) .... Take 1 Tablet By Mouth Once A Day 6)  Vitamin  C 1000 Mg Tabs (Ascorbic Acid) .... Take 1 Tablet By Mouth Once A Day 7)  Fish Oil 1000 Mg Caps (Omega-3 Fatty Acids) .... Take 1 Tablet By Mouth Once A Day 8)  Aspir-Low 81 Mg Tbec (Aspirin) .... Take 1 Tablet By Mouth Once A Day 9)  Vitamin E 400 Unit Caps (Vitamin E) .... Take 1 Tablet By Mouth Once A Day 10)  Imipramine Hcl 10 Mg Tabs (Imipramine Hcl) .... Take 1 Tab By Mouth At Bedtime 11)  Omeprazole 20 Mg Cpdr (Omeprazole) .... Take 1 Capsule By Mouth Once A Day  Allergies (verified): No Known Drug Allergies  Past History:  Past medical history reviewed for relevance to current acute and chronic problems.  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 CV:  Complains of chest pain or discomfort;  denies palpitations, shortness of breath with exertion, and swelling of feet. Resp:  Denies cough and shortness of breath. GI:  Complains of constipation and indigestion; denies bloody stools, dark tarry stools, nausea, and vomiting.  Physical Exam  General:  Well-developed,well-nourished,in no acute distress; alert,appropriate and cooperative throughout examination Head:  Normocephalic and atraumatic without obvious abnormalities. No apparent alopecia or balding. Ears:  External ear exam shows no significant lesions or deformities.  Otoscopic examination reveals clear canals, tympanic membranes are intact bilaterally without bulging, retraction, inflammation or discharge. Hearing is grossly normal bilaterally. Nose:  External nasal examination shows no deformity or inflammation. Nasal mucosa are pink and moist without lesions or exudates. Mouth:  Oral mucosa and oropharynx without lesions or exudates.  Teeth in good repair. Neck:  No deformities, masses, or tenderness noted. Chest  Wall:  no deformities, no tenderness, and no mass.   Lungs:  Normal respiratory effort, chest expands symmetrically. Lungs are clear to auscultation, no crackles or wheezes. Heart:  Normal rate and regular rhythm. S1 and S2 normal without gallop, murmur, click, rub or other extra sounds. Cervical Nodes:  No lymphadenopathy noted Psych:  Oriented X3, good eye contact, and slightly anxious.     Impression & Recommendations:  Problem # 1:  CHEST PAIN UNSPECIFIED (ICD-786.50) Assessment Unchanged Hx of atypical chest pain.  Question if due to uncontrolled GERD.  Orders: EKG w/ Interpretation (93000) - nl. see print out.  Problem # 2:  GERD (ICD-530.81) Assessment: Unchanged Discussed use of OTC meds in addition as needed.  Increase Omeprazole to 2 daily. Will refer to GI.    Her updated medication list for this problem includes:    Omeprazole 20 Mg Cpdr (Omeprazole) .Marland Kitchen... Take 1 capsule by mouth once a day  Orders: T-CBC No Diff ([id number]) T-Helicobacter AB - IgG ([id number]) Gastroenterology Referral (GI)  Problem # 3:  HYPERTENSION (ICD-401.9) Assessment: Deteriorated Will continue current medication since was recently well controlled. Will have pt monitor BP at home, & bring readings & machine to the office to verify accuracy. Discussed with pt if continues to be elevated at next appt will need add'l BP med for control.  Her updated medication list for this problem includes:    Maxzide-25 37.5-25 Mg Tabs (Triamterene-hctz) .Marland Kitchen... Take 1 tablet by mouth once a day  Orders: T-Comprehensive Metabolic Panel ([id number])  Complete Medication List: 1)  Nasonex 50 Mcg/act Susp (Mometasone furoate) .... Two puffs per nostril qd 2)  Maxzide-25 37.5-25 Mg Tabs (Triamterene-hctz) .... Take 1 tablet by mouth once a day 3)  Temazepam 15 Mg Caps (Temazepam) .... Take 1 tab by mouth at bedtime 4)  Klor-con M20 20 Meq Cr-tabs (Potassium chloride crys cr) .... One tab by mouth qd 5)   Calcium-magnesium-zinc 333-133-8.3 Mg Tabs (Calcium-magnesium-zinc) .... Take 1 tablet by mouth once a day 6)  Vitamin C 1000 Mg Tabs (Ascorbic acid) .... Take 1 tablet by mouth once a day 7)  Fish Oil 1000 Mg Caps (Omega-3 fatty acids) .... Take 1 tablet by mouth once a day 8)  Aspir-low 81 Mg Tbec (Aspirin) .... Take 1 tablet by mouth once a day 9)  Vitamin E 400 Unit Caps (Vitamin e) .... Take 1 tablet by mouth once a day 10)  Imipramine Hcl 10 Mg Tabs (Imipramine hcl) .... Take 1 tab by mouth at bedtime 11)  Omeprazole 20 Mg Cpdr (Omeprazole) .... Take 1 capsule by mouth once a day 12)  Hydroxyzine Hcl 25 Mg Tabs (Hydroxyzine hcl) .... Take  one every 8 hrs as needed for itching  Other Orders: T-Lipid Profile ([id number]) T-TSH ([id number])  Patient Instructions: 1)  Follow up appt in 1 week to recheck your blood pressure.  Bring your blood pressure machine with you. 2)  I have ordered lab work.  Have this drawn fasting. 3)  Continue your current medications. 4)  I am referring you to a GI dr for your heartburn. 5)  I have prescribed a medication that you can use as needed when you are itchy. Prescriptions: HYDROXYZINE HCL 25 MG TABS (HYDROXYZINE HCL) take one every 8 hrs as needed for itching  #30 x 0   Entered and Authorized by:   Esperanza Sheets PA   Signed by:   Esperanza Sheets PA on 12/16/2009   Method used:   Electronically to        [facility]* (retail)       [address]       [place], [place]  [address]       Ph: [phone]       Fax: [phone]   RxID:   [id number]

## 2010-10-31 NOTE — Letter (Signed)
Summary: MISC  MISC   Imported By: Lind Guest 03/27/2010 09:47:16  _____________________________________________________________________  External Attachment:    Type:   Image     Comment:   External Document

## 2011-01-09 ENCOUNTER — Encounter: Payer: Self-pay | Admitting: Family Medicine

## 2011-01-11 ENCOUNTER — Encounter: Payer: Self-pay | Admitting: Family Medicine

## 2011-01-12 ENCOUNTER — Ambulatory Visit (INDEPENDENT_AMBULATORY_CARE_PROVIDER_SITE_OTHER): Payer: Managed Care, Other (non HMO) | Admitting: Family Medicine

## 2011-01-12 ENCOUNTER — Encounter: Payer: Self-pay | Admitting: Family Medicine

## 2011-01-12 VITALS — BP 110/70 | HR 73 | Resp 16 | Ht 62.25 in | Wt 145.8 lb

## 2011-01-12 DIAGNOSIS — B354 Tinea corporis: Secondary | ICD-10-CM

## 2011-01-12 DIAGNOSIS — E785 Hyperlipidemia, unspecified: Secondary | ICD-10-CM

## 2011-01-12 DIAGNOSIS — Z23 Encounter for immunization: Secondary | ICD-10-CM

## 2011-01-12 DIAGNOSIS — M129 Arthropathy, unspecified: Secondary | ICD-10-CM

## 2011-01-12 DIAGNOSIS — J309 Allergic rhinitis, unspecified: Secondary | ICD-10-CM

## 2011-01-12 DIAGNOSIS — M1990 Unspecified osteoarthritis, unspecified site: Secondary | ICD-10-CM

## 2011-01-12 DIAGNOSIS — N39498 Other specified urinary incontinence: Secondary | ICD-10-CM

## 2011-01-12 DIAGNOSIS — Z2911 Encounter for prophylactic immunotherapy for respiratory syncytial virus (RSV): Secondary | ICD-10-CM

## 2011-01-12 DIAGNOSIS — I10 Essential (primary) hypertension: Secondary | ICD-10-CM

## 2011-01-12 DIAGNOSIS — B351 Tinea unguium: Secondary | ICD-10-CM

## 2011-01-12 MED ORDER — CETIRIZINE HCL 10 MG PO CHEW: 10.0000 mg | CHEWABLE_TABLET | Freq: Every day | ORAL | Status: DC

## 2011-01-12 MED ORDER — TRIAMTERENE-HCTZ 37.5-25 MG PO TABS: 1.0000 | ORAL_TABLET | Freq: Every day | ORAL | Status: DC

## 2011-01-12 MED ORDER — SOLIFENACIN SUCCINATE 10 MG PO TABS: 10.0000 mg | ORAL_TABLET | Freq: Every day | ORAL | Status: DC

## 2011-01-12 MED ORDER — PREDNISONE (PAK) 5 MG PO TABS: 5.0000 mg | ORAL_TABLET | ORAL | Status: AC

## 2011-01-12 MED ORDER — OMEPRAZOLE 20 MG PO TBEC: 20.0000 mg | DELAYED_RELEASE_TABLET | Freq: Every day | ORAL | Status: DC

## 2011-01-12 MED ORDER — CLOTRIMAZOLE-BETAMETHASONE 1-0.05 % EX CREA: TOPICAL_CREAM | CUTANEOUS | Status: AC

## 2011-01-12 MED ORDER — TERBINAFINE HCL 250 MG PO TABS: 250.0000 mg | ORAL_TABLET | Freq: Every day | ORAL | Status: AC

## 2011-01-12 NOTE — Progress Notes (Signed)
  Subjective:    Patient ID: Tanya [name], female    DOB: September 08, 1951, 61 y.o.   MRN: [id number]  HPI Pt c/o pain, swelling and deformity of right mid finger x 2 months, also the right thumb is deformed , wants to see rheumatology.  C/o incontinence with laughing and sneezing worsening over the past several months.  C/o fungal toenail needs medication and also  puritic rash on left neck x 4 months.  C/o substernal burning pain when she does not take her reflux medication, I advised she needs to take this regularly.     Review of Systems Denies recent fever or chills. Denies sinus pressure, nasal congestion, ear pain or sore throat. Denies chest congestion, productive cough or wheezing. Denies chest pains, palpitations, paroxysmal nocturnal dyspnea, orthopnea and leg swelling Denies  nausea, vomiting,diarrhea or constipation.  Denies rectal bleeding or change in bowel movement. Denies dysuria, frequency, hesitancy or incontinence. Denies headaches, seizure, numbness, or tingling. Denies depression, anxiety or insomnia. .        Objective:   Physical Exam    Patient alert and oriented and in no Cardiopulmonary distress.  HEENT: No facial asymmetry, EOMI, no sinus tenderness, TM's clear, Oropharynx pink and moist.  Neck supple no adenopathy. Chest: Clear to auscultation bilaterally. CVS: S1, S2 no murmurs, no S3. ABD: Soft non tender. Bowel sounds normal. Ext: No edema  MS: Adequate ROM spine, shoulders, hips and knees.Pt does have marked deformity and swelling of 3rd  Right finger also of the thumb  Skin: Intact,fungal infection on the left neck, also bilateral onychomycosis Psych: Good eye contact, normal affect. Memory intact not anxious or depressed appearing.  CNS: CN 2-12 intact, power, tone and sensation normal throughout.    Assessment & Plan:  1. Hypertension , controlled, no medication change. 2. Fungal infection of skin and toenails, pt to use  clotrimazole/betameth cream to the neck and terbinafine prescribed for the toenails. 3. Rheumatoid arthritis is my impression, prednisone dose pack prescribed, and pt referred to rheumatologist 4. Dyslipidemia: low fat diet discussed and encourage , rept labs tomorrow. 5. Giggle incontinence: med prescribed

## 2011-01-12 NOTE — Patient Instructions (Addendum)
F/U in 4 months.   Fasting lipid and chem 7 in am   You are being referred to a rheumatologist.  Medications, 5 new ones are sent to your 2 pharmacies as we have  Discussed It is important that you exercise regularly at least 30 minutes 5 times a week. If you develop chest pain, have severe difficulty breathing, or feel very tired, stop exercising immediately and seek medical attention  A healthy diet is rich in fruit, vegetables and whole grains. Poultry fish, nuts and beans are a healthy choice for protein rather then red meat. A low sodium diet and drinking 64 ounces of water daily is generally recommended. Oils and sweet should be limited. Carbohydrates especially for those who are diabetic or overweight, should be limited to 34-45 gram per meal. It is important to eat on a regular schedule, at least 3 times daily. Snacks should be primarily fruits, vegetables or nuts.

## 2011-01-13 LAB — LIPID PANEL
Cholesterol: 216 mg/dL — ABNORMAL HIGH (ref 0–200)
LDL Cholesterol: 125 mg/dL — ABNORMAL HIGH (ref 0–99)
Total CHOL/HDL Ratio: 3.9 Ratio
VLDL: 36 mg/dL (ref 0–40)

## 2011-01-13 LAB — BASIC METABOLIC PANEL
BUN: 19 mg/dL (ref 6–23)
CO2: 26 mEq/L (ref 19–32)
Chloride: 104 mEq/L (ref 96–112)
Glucose, Bld: 84 mg/dL (ref 70–99)
Potassium: 3.9 mEq/L (ref 3.5–5.3)

## 2011-02-16 NOTE — H&P (Signed)
Tanya [name], [place]              ACCOUNT NO.:  [id number]   MEDICAL RECORD NO.:  [id number]          PATIENT TYPE:  AMB   LOCATION:  DAY                           FACILITY:  [facility]   PHYSICIAN:  Jerolyn Shin C. Katrinka Blazing, M.D.   DATE OF BIRTH:  01-20-1951   DATE OF ADMISSION:  DATE OF DISCHARGE:  LH                                HISTORY & PHYSICAL   A 60 year old female scheduled for screening colonoscopy.  The patient has  no problems with her GI tract.  She has had regular bowel movements and  there is no family history of colon cancer.   PAST HISTORY:  1.  Hypertension.  2.  Hyperlipidemia.  3.  Depression.   SURGERY:  None.   SOCIAL HISTORY:  She is a widow.  She does not drink, smoke, or use drugs.   PHYSICAL EXAMINATION:  VITAL SIGNS:  Blood pressure 156/94, pulse 60,  respirations 18, weight 148 pounds.  HEENT:  Unremarkable.  NECK:  Supple.  No JVD, bruit, adenopathy, or thyromegaly.  CHEST:  Clear to auscultation.  HEART:  Regular rate and rhythm without murmur, gallop, or rub.  ABDOMEN:  Soft, nontender.  No masses.  EXTREMITIES:  No cyanosis, clubbing, or edema.  NEUROLOGIC:  No focal motor, sensory, or cerebellar deficit.   IMPRESSION:  1.  Need for screening colonoscopy.  2.  Hypertension.  3.  Depression.   PLAN:  Screening colonoscopy.      LCS/MEDQ  D:  04/04/2005  T:  04/04/2005  Job:  [id number]

## 2011-05-17 ENCOUNTER — Encounter: Payer: Self-pay | Admitting: Family Medicine

## 2011-05-18 ENCOUNTER — Ambulatory Visit (INDEPENDENT_AMBULATORY_CARE_PROVIDER_SITE_OTHER): Payer: Managed Care, Other (non HMO) | Admitting: Family Medicine

## 2011-05-18 ENCOUNTER — Encounter: Payer: Self-pay | Admitting: Family Medicine

## 2011-05-18 ENCOUNTER — Other Ambulatory Visit: Payer: Self-pay | Admitting: Family Medicine

## 2011-05-18 VITALS — BP 128/72 | HR 67 | Ht 62.5 in | Wt 143.1 lb

## 2011-05-18 DIAGNOSIS — M25529 Pain in unspecified elbow: Secondary | ICD-10-CM

## 2011-05-18 DIAGNOSIS — M25521 Pain in right elbow: Secondary | ICD-10-CM | POA: Insufficient documentation

## 2011-05-18 DIAGNOSIS — K219 Gastro-esophageal reflux disease without esophagitis: Secondary | ICD-10-CM

## 2011-05-18 DIAGNOSIS — E785 Hyperlipidemia, unspecified: Secondary | ICD-10-CM

## 2011-05-18 DIAGNOSIS — R079 Chest pain, unspecified: Secondary | ICD-10-CM

## 2011-05-18 DIAGNOSIS — I10 Essential (primary) hypertension: Secondary | ICD-10-CM

## 2011-05-18 DIAGNOSIS — N3090 Cystitis, unspecified without hematuria: Secondary | ICD-10-CM

## 2011-05-18 DIAGNOSIS — Z139 Encounter for screening, unspecified: Secondary | ICD-10-CM

## 2011-05-18 LAB — POCT URINALYSIS DIPSTICK
Glucose, UA: NEGATIVE
Leukocytes, UA: NEGATIVE
Nitrite, UA: NEGATIVE
Protein, UA: NEGATIVE
Urobilinogen, UA: 0.2

## 2011-05-18 MED ORDER — PRAVASTATIN SODIUM 40 MG PO TABS: 40.0000 mg | ORAL_TABLET | Freq: Every evening | ORAL | Status: DC

## 2011-05-18 MED ORDER — TRAMADOL HCL 50 MG PO TABS: 50.0000 mg | ORAL_TABLET | Freq: Every evening | ORAL | Status: AC | PRN

## 2011-05-18 NOTE — Assessment & Plan Note (Addendum)
Hurt right elbow on the job, 1 month ago, c/o pain, xray of elbow, and tramadol for as needed use

## 2011-05-18 NOTE — Assessment & Plan Note (Signed)
C/o urinary frequency espescialy in the mornings , however drinks a lot of water, cCUA checked and is negative

## 2011-05-18 NOTE — Patient Instructions (Signed)
CPE early December.  Mammogram due after 11/29 , we will schedule.  New medication sent for elbow pain, also an xray of the elbow is ordered.  New medication is sent for your cholesterol  No evidence of urinary infection and the eKG is entirely normal.  Pls follow low fat diet, reduce red meat and fried and fatty  Foods, also reduce caffeine intake  It is important that you exercise regularly at least 30 minutes 5 times a week. If you develop chest pain, have severe difficulty breathing, or feel very tired, stop exercising immediately and seek medical attention

## 2011-05-18 NOTE — Assessment & Plan Note (Signed)
Negative eKG totally normal , pt reassured , states she has heartburn often

## 2011-05-19 NOTE — Progress Notes (Signed)
  Subjective:    Patient ID: Tanya [name], female    DOB: 05-02-1951, 60 y.o.   MRN: [id number]  HPI The PT is here for follow up and re-evaluation of chronic medical conditions, medication management and review of any available recent lab and radiology data. Her recent lipid panel is elevated, and she is anxious to resume medication, lab was drawn at work and she discussed with the nurse at work  who recommended this Preventive health is updated, specifically  Cancer screening and Immunization.   Questions or concerns regarding consultations or procedures which the PT has had in the interim are  addressed. The PT denies any adverse reactions to current medications since the last visit.  C/o right  elbow pain following recent trauma on the job, no significant limitation in movement, however requests medication for pain for use as needed    Review of Systems See HPI Denies recent fever or chills. Denies sinus pressure, nasal congestion, ear pain or sore throat. Denies chest congestion, productive cough or wheezing. C/o intermittent chest pain,denies  palpitations and leg swelling Denies abdominal pain, nausea, vomiting,diarrhea or constipation.   Denies dysuria,c/o early morning  Frequency,denies  hesitancy or incontinence.states she drinks a lot of water in the mornings  Denies headaches, seizures, numbness, or tingling. Denies depression, anxiety or insomnia. Denies skin break down or rash.        Objective:   Physical Exam Patient alert and oriented and in no cardiopulmonary distress.  HEENT: No facial asymmetry, EOMI, no sinus tenderness,  oropharynx pink and moist.  Neck supple no adenopathy.  Chest: Clear to auscultation bilaterally.non tender  CVS: S1, S2 no murmurs, no S3.  ABD: Soft non tender. Bowel sounds normal.  Ext: No edema  MS: Adequate ROM spine, shoulders, hips and knees. Right  elbow tender over medial epicondyle, adequate ROM  Skin: Intact, no  ulcerations or rash noted.  Psych: Good eye contact, normal affect. Memory intact not anxious or depressed appearing.  CNS: CN 2-12 intact, power, tone and sensation normal throughout.        Assessment & Plan:

## 2011-05-19 NOTE — Assessment & Plan Note (Signed)
Controlled, no change in medication  

## 2011-05-19 NOTE — Assessment & Plan Note (Signed)
Low fat diet discussed, pt to start medication

## 2011-05-19 NOTE — Assessment & Plan Note (Signed)
At times uncontrolled , caffeine intake to be reduced

## 2011-05-24 ENCOUNTER — Encounter: Payer: Self-pay | Admitting: Family Medicine

## 2011-06-11 ENCOUNTER — Telehealth: Payer: Self-pay | Admitting: Family Medicine

## 2011-06-11 MED ORDER — TRIAMTERENE-HCTZ 37.5-25 MG PO TABS: 1.0000 | ORAL_TABLET | Freq: Every day | ORAL | Status: DC

## 2011-06-11 NOTE — Telephone Encounter (Signed)
Sent to her mail order and a 30 day supply to [facility]

## 2011-09-03 ENCOUNTER — Ambulatory Visit (HOSPITAL_COMMUNITY): Payer: Managed Care, Other (non HMO)

## 2011-09-05 ENCOUNTER — Other Ambulatory Visit: Payer: Self-pay | Admitting: Family Medicine

## 2011-09-12 ENCOUNTER — Encounter: Payer: Self-pay | Admitting: Family Medicine

## 2011-09-18 ENCOUNTER — Encounter: Payer: Self-pay | Admitting: Family Medicine

## 2011-09-18 ENCOUNTER — Ambulatory Visit (INDEPENDENT_AMBULATORY_CARE_PROVIDER_SITE_OTHER): Payer: Managed Care, Other (non HMO) | Admitting: Family Medicine

## 2011-09-18 ENCOUNTER — Other Ambulatory Visit (HOSPITAL_COMMUNITY)
Admission: RE | Admit: 2011-09-18 | Discharge: 2011-09-18 | Disposition: A | Discharge status: Home / Self Care | Payer: Managed Care, Other (non HMO) | Source: Ambulatory Visit | Attending: Family Medicine | Admitting: Family Medicine

## 2011-09-18 VITALS — BP 130/82 | HR 74 | Resp 16 | Ht 62.5 in | Wt 139.0 lb

## 2011-09-18 DIAGNOSIS — Z01419 Encounter for gynecological examination (general) (routine) without abnormal findings: Secondary | ICD-10-CM | POA: Insufficient documentation

## 2011-09-18 DIAGNOSIS — I10 Essential (primary) hypertension: Secondary | ICD-10-CM

## 2011-09-18 DIAGNOSIS — E785 Hyperlipidemia, unspecified: Secondary | ICD-10-CM

## 2011-09-18 DIAGNOSIS — R51 Headache: Secondary | ICD-10-CM

## 2011-09-18 DIAGNOSIS — Z0000 Encounter for general adult medical examination without abnormal findings: Secondary | ICD-10-CM

## 2011-09-18 LAB — POC HEMOCCULT BLD/STL (OFFICE/1-CARD/DIAGNOSTIC): Fecal Occult Blood, POC: NEGATIVE

## 2011-09-18 MED ORDER — TRIAMTERENE-HCTZ 37.5-25 MG PO TABS: 1.0000 | ORAL_TABLET | Freq: Every day | ORAL | Status: DC

## 2011-09-18 NOTE — Progress Notes (Signed)
Stool card [id number]   exp4/14

## 2011-09-18 NOTE — Patient Instructions (Addendum)
F/u in 6 months.  Labs are past due please do Dec 28 or after, cbc, tsh, fasting lipid, hepatic and chem 7, pt/ptt for bruising  TdAP today.  Use tylenol or advil one daily for headache if needed  It is important that you exercise regularly at least 30 minutes 5 times a week. If you develop chest pain, have severe difficulty breathing, or feel very tired, stop exercising immediately and seek medical attention  A healthy diet is rich in fruit, vegetables and whole grains. Poultry fish, nuts and beans are a healthy choice for protein rather then red meat. A low sodium diet and drinking 64 ounces of water daily is generally recommended. Oils and sweet should be limited. Carbohydrates especially for those who are diabetic or overweight, should be limited to 30-45 gram per meal. It is important to eat on a regular schedule, at least 3 times daily. Snacks should be primarily fruits, vegetables or nuts.  Your mammogram will be scheduled on your way out

## 2011-09-19 NOTE — Progress Notes (Signed)
Subjective:     Patient ID: Tanya [name], female   DOB: 1950-12-16, 60 y.o.   MRN: [id number]  HPI The PT is here for follow annual exam and re-evaluation of chronic medical conditions, medication management and review of any available recent lab and radiology data.  Preventive health is updated, specifically  Cancer screening and Immunization.   Questions or concerns regarding consultations or procedures which the PT has had in the interim are  addressed. The PT denies any adverse reactions to current medications since the last visit.  C/o easy bruising, also of occasional headaches, with no associated weakness, numbness or blurred vision    Review of Systems See HPI Denies recent fever or chills. Denies sinus pressure, nasal congestion, ear pain or sore throat. Denies chest congestion, productive cough or wheezing. Denies chest pains, palpitations and leg swelling Denies abdominal pain, nausea, vomiting,diarrhea or constipation.   Denies dysuria, frequency, hesitancy or incontinence. Denies joint pain, swelling and limitation in mobility. Denies  seizures, numbness, or tingling. Denies depression, anxiety or insomnia. Denies skin break down or rash.        Objective:   Physical Exam Pleasant well nourished female, alert and oriented x 3, in no cardio-pulmonary distress. Afebrile. HEENT No facial trauma or asymetry. Sinuses non tender.  EOMI, PERTL, fundoscopic exam is normal, no hemorhage or exudate.  External ears normal, tympanic membranes clear. Oropharynx moist, no exudate, good dentition. Neck: supple, no adenopathy,JVD or thyromegaly.No bruits.  Chest: Clear to ascultation bilaterally.No crackles or wheezes. Non tender to palpation  Breast: No asymetry,no masses. No nipple discharge or inversion. No axillary or supraclavicular adenopathy  Cardiovascular system; Heart sounds normal,  S1 and  S2 ,no S3.  No murmur, or thrill. Apical beat not  displaced Peripheral pulses normal.  Abdomen: Soft, non tender, no organomegaly or masses. No bruits. Bowel sounds normal. No guarding, tenderness or rebound.  Rectal:  No mass. Guaiac negative stool.  GU: Uterus and adnexa nrmal. No vaginal discharge, no cervical motion or adnexal tenderness  Musculoskeletal exam: Full ROM of spine, hips , shoulders and knees. No deformity ,swelling or crepitus noted. No muscle wasting or atrophy.   Neurologic: Cranial nerves 2 to 12 intact. Power, tone ,sensation and reflexes normal throughout. No disturbance in gait. No tremor.  Skin: Intact, no ulceration, erythema , scaling or rash noted. Pigmentation normal throughout  Psych; Normal mood and affect. Judgement and concentration normal     Assessment:        Plan:

## 2011-09-19 NOTE — Assessment & Plan Note (Signed)
Controlled, no change in medication  

## 2011-09-19 NOTE — Assessment & Plan Note (Signed)
Hyperlipidemia:Low fat diet discussed and encouraged.  Labs to be done

## 2011-09-19 NOTE — Assessment & Plan Note (Signed)
Chronic occasional headache, no alert symptoms and neurologic exam normal Tylenol as needed

## 2011-09-21 ENCOUNTER — Ambulatory Visit (HOSPITAL_COMMUNITY): Payer: Managed Care, Other (non HMO)

## 2011-09-22 LAB — COMPREHENSIVE METABOLIC PANEL
ALT: 21 U/L (ref 0–35)
AST: 25 U/L (ref 0–37)
BUN: 16 mg/dL (ref 6–23)
CO2: 29 mEq/L (ref 19–32)
Calcium: 9.2 mg/dL (ref 8.4–10.5)
Chloride: 107 mEq/L (ref 96–112)
Creat: 0.63 mg/dL (ref 0.50–1.10)
Total Bilirubin: 0.5 mg/dL (ref 0.3–1.2)

## 2011-09-22 LAB — CBC
Platelets: 404 10*3/uL — ABNORMAL HIGH (ref 150–400)
RBC: 4.19 MIL/uL (ref 3.87–5.11)
RDW: 14.2 % (ref 11.5–15.5)
WBC: 5.1 10*3/uL (ref 4.0–10.5)

## 2011-09-22 LAB — HEPATIC FUNCTION PANEL
AST: 25 U/L (ref 0–37)
Albumin: 4.1 g/dL (ref 3.5–5.2)
Alkaline Phosphatase: 55 U/L (ref 39–117)
Indirect Bilirubin: 0.4 mg/dL (ref 0.0–0.9)
Total Protein: 7 g/dL (ref 6.0–8.3)

## 2011-09-22 LAB — PROTIME-INR: INR: 0.89 (ref <1.50)

## 2011-09-22 LAB — LIPID PANEL
HDL: 71 mg/dL (ref >39)
LDL Cholesterol: 108 mg/dL — ABNORMAL HIGH (ref 0–99)
Triglycerides: 88 mg/dL (ref <150)

## 2011-09-22 LAB — APTT: aPTT: 29 seconds (ref 24–37)

## 2011-09-22 LAB — TSH: TSH: 0.897 u[IU]/mL (ref 0.350–4.500)

## 2011-09-27 ENCOUNTER — Ambulatory Visit (HOSPITAL_COMMUNITY)
Admission: RE | Admit: 2011-09-27 | Discharge: 2011-09-27 | Disposition: A | Discharge status: Home / Self Care | Payer: Managed Care, Other (non HMO) | Source: Ambulatory Visit | Attending: Family Medicine | Admitting: Family Medicine

## 2011-09-27 DIAGNOSIS — Z139 Encounter for screening, unspecified: Secondary | ICD-10-CM

## 2011-09-27 DIAGNOSIS — Z1231 Encounter for screening mammogram for malignant neoplasm of breast: Secondary | ICD-10-CM | POA: Insufficient documentation

## 2011-11-26 ENCOUNTER — Ambulatory Visit (INDEPENDENT_AMBULATORY_CARE_PROVIDER_SITE_OTHER): Payer: Managed Care, Other (non HMO) | Admitting: Family Medicine

## 2011-11-26 ENCOUNTER — Encounter: Payer: Self-pay | Admitting: Family Medicine

## 2011-11-26 ENCOUNTER — Telehealth: Payer: Self-pay

## 2011-11-26 VITALS — BP 130/80 | HR 88 | Resp 16 | Ht 62.5 in | Wt 145.0 lb

## 2011-11-26 DIAGNOSIS — I10 Essential (primary) hypertension: Secondary | ICD-10-CM

## 2011-11-26 DIAGNOSIS — N3000 Acute cystitis without hematuria: Secondary | ICD-10-CM

## 2011-11-26 DIAGNOSIS — N3090 Cystitis, unspecified without hematuria: Secondary | ICD-10-CM

## 2011-11-26 LAB — POCT URINALYSIS DIPSTICK
Nitrite, UA: NEGATIVE
Urobilinogen, UA: 0.2
pH, UA: 7.5

## 2011-11-26 MED ORDER — CIPROFLOXACIN HCL 500 MG PO TABS: 500.0000 mg | ORAL_TABLET | Freq: Two times a day (BID) | ORAL | Status: AC

## 2011-11-26 NOTE — Progress Notes (Signed)
  Subjective:    Patient ID: [name], female    DOB: 03/07/1951, 61 y.o.   MRN: [id number]  HPI 2 day h/o dysuria, frequency, hematuria, no tsexually active. No flank pain, fever or chills. C/o suprapubic discomfort. Denies vaginal d/c   Review of Systems See HPI Denies recent fever or chills. Denies sinus pressure, nasal congestion, ear pain or sore throat. Denies chest congestion, productive cough or wheezing. Denies chest pains, palpitations and leg swelling  Denies skin break down or rash.        Objective:   Physical Exam Patient alert and oriented and in no cardiopulmonary distress.  HEENT: No facial asymmetry, EOMI, no sinus tenderness,  oropharynx pink and moist.  Neck supple no adenopathy.  Chest: Clear to auscultation bilaterally.  CVS: S1, S2 no murmurs, no S3.  ABD: Soft , mild suprapubic tenderness. No renal angle tenderness. Bowel sounds normal.  Ext: No edema  CNS: CN 2-12 intact, power, normal throughout.        Assessment & Plan:

## 2011-11-26 NOTE — Assessment & Plan Note (Signed)
Acute infection, antibiotics prescribed and pt education done at visit verbally, and info provided

## 2011-11-26 NOTE — Telephone Encounter (Signed)
Pt walked in. Complaining of flank pain and UTI symptoms. Ok to put in as nurse visit?

## 2011-11-26 NOTE — Patient Instructions (Addendum)
F/u as before.  You are being treated for a urinary tract infection, and medication has been sent to [facility].  You will be provided with a handout on urinary infections also   Urinary Tract Infection Infections of the urinary tract can start in several places. A bladder infection (cystitis), a kidney infection (pyelonephritis), and a prostate infection (prostatitis) are different types of urinary tract infections (UTIs). They usually get better if treated with medicines (antibiotics) that kill germs. Take all the medicine until it is gone. You or your child may feel better in a few days, but TAKE ALL MEDICINE or the infection may not respond and may become more difficult to treat. HOME CARE INSTRUCTIONS   Drink enough water and fluids to keep the urine clear or pale yellow. Cranberry juice is especially recommended, in addition to large amounts of water.   Avoid caffeine, tea, and carbonated beverages. They tend to irritate the bladder.   Alcohol may irritate the prostate.   Only take over-the-counter or prescription medicines for pain, discomfort, or fever as directed by your caregiver.  To prevent further infections:  Empty the bladder often. Avoid holding urine for long periods of time.   After a bowel movement, women should cleanse from front to back. Use each tissue only once.   Empty the bladder before and after sexual intercourse.  FINDING OUT THE RESULTS OF YOUR TEST Not all test results are available during your visit. If your or your child's test results are not back during the visit, make an appointment with your caregiver to find out the results. Do not assume everything is normal if you have not heard from your caregiver or the medical facility. It is important for you to follow up on all test results. SEEK MEDICAL CARE IF:   There is back pain.   Your baby is older than 3 months with a rectal temperature of 100.5 F (38.1 C) or higher for more than 1 day.    Your or your child's problems (symptoms) are no better in 3 days. Return sooner if you or your child is getting worse.  SEEK IMMEDIATE MEDICAL CARE IF:   There is severe back pain or lower abdominal pain.   You or your child develops chills.   You have a fever.   Your baby is older than 3 months with a rectal temperature of 102 F (38.9 C) or higher.   Your baby is 77 months old or younger with a rectal temperature of 100.4 F (38 C) or higher.   There is nausea or vomiting.   There is continued burning or discomfort with urination.  MAKE SURE YOU:   Understand these instructions.   Will watch your condition.   Will get help right away if you are not doing well or get worse.  Document Released: 06/27/2005 Document Revised: 05/30/2011 Document Reviewed: 01/30/2007 [facility] Patient Information 2012 [place], [place].

## 2011-11-26 NOTE — Assessment & Plan Note (Signed)
Controlled, no change in medication  

## 2011-11-26 NOTE — Telephone Encounter (Signed)
Pt seen as ov

## 2011-12-04 ENCOUNTER — Other Ambulatory Visit: Payer: Self-pay | Admitting: Family Medicine

## 2012-01-31 ENCOUNTER — Encounter: Payer: Self-pay | Admitting: Family Medicine

## 2012-01-31 ENCOUNTER — Ambulatory Visit (INDEPENDENT_AMBULATORY_CARE_PROVIDER_SITE_OTHER): Payer: Managed Care, Other (non HMO) | Admitting: Family Medicine

## 2012-01-31 VITALS — BP 140/82 | HR 77 | Resp 16 | Ht 62.5 in | Wt 144.0 lb

## 2012-01-31 DIAGNOSIS — R319 Hematuria, unspecified: Secondary | ICD-10-CM

## 2012-01-31 DIAGNOSIS — R310 Gross hematuria: Secondary | ICD-10-CM

## 2012-01-31 DIAGNOSIS — N3000 Acute cystitis without hematuria: Secondary | ICD-10-CM

## 2012-01-31 DIAGNOSIS — N3090 Cystitis, unspecified without hematuria: Secondary | ICD-10-CM

## 2012-01-31 LAB — CBC
HCT: 40.4 % (ref 36.0–46.0)
Hemoglobin: 13.5 g/dL (ref 12.0–15.0)
MCV: 93.7 fL (ref 78.0–100.0)
RBC: 4.31 MIL/uL (ref 3.87–5.11)
RDW: 13.3 % (ref 11.5–15.5)
WBC: 13.6 10*3/uL — ABNORMAL HIGH (ref 4.0–10.5)

## 2012-01-31 LAB — BASIC METABOLIC PANEL
Glucose, Bld: 83 mg/dL (ref 70–99)
Potassium: 3.3 mEq/L — ABNORMAL LOW (ref 3.5–5.3)
Sodium: 138 mEq/L (ref 135–145)

## 2012-01-31 LAB — POCT URINALYSIS DIPSTICK
Ketones, UA: NEGATIVE
Protein, UA: 100
Spec Grav, UA: 1.015

## 2012-01-31 MED ORDER — CEPHALEXIN 500 MG PO CAPS: 500.0000 mg | ORAL_CAPSULE | Freq: Three times a day (TID) | ORAL | Status: AC

## 2012-01-31 NOTE — Patient Instructions (Signed)
Get your blood drawn today Take then antibiotics for the urine infection You will be set up for a CT scan Do not take  Osteo Biflex for now Do not take the aspirin for now I will also set you up to see a urologist  Keep previous f/u appt

## 2012-02-01 DIAGNOSIS — R310 Gross hematuria: Secondary | ICD-10-CM | POA: Insufficient documentation

## 2012-02-01 NOTE — Assessment & Plan Note (Addendum)
Obtain CT abd/pelvis, refer to urology, her last two Urinalysis have showed moderate amounts of blood.  Obtain labs, CBC , BMET

## 2012-02-01 NOTE — Progress Notes (Signed)
  Subjective:    Patient ID: Tanya [name], female    DOB: [birth year], 61 y.o.   MRN: [id number]  HPI  Patient presents with dysuria and hematuria. He will this morning and with urination she noted the toilet was full of blood. She's had history of cystitis in the past with some mild blood as well. She is a nonsmoker. [place] She does admit to some pelvic pressure and pain for the past 24 hours. Denies nausea, vomiting, fever. Denies blood in stool  Review of Systems - per above    GEN- denies fatigue, fever, weight loss,weakness, recent illness HEENT- denies eye drainage, change in vision, nasal discharge, CVS- denies chest pain, palpitations RESP- denies SOB, cough, wheeze ABD- denies N/V, change in stools,+ abd pain GU- + dysuria, +hematuria, dribbling, incontinence MSK- denies joint pain, muscle aches, injury Neuro- denies headache, dizziness, syncope, seizure activity      Objective:   Physical Exam GEN- NAD, alert and oriented x3 HEENT- PERRL, EOMI, non injected sclera, pink conjunctiva, MMM, oropharynx clear CVS- RRR, no murmur RESP-CTAB ABD-NABS,soft, TTP suprapubic region, no rebound,no masses felt, ND EXT- No edema Pulses- Radial, DP- 2+        Assessment & Plan:

## 2012-02-01 NOTE — Assessment & Plan Note (Addendum)
Will treat UTI with Keflex, send for culture, she has had recurrent UTI, i will send to urology secondary to recurrence and hematuria

## 2012-02-03 LAB — URINE CULTURE

## 2012-02-06 ENCOUNTER — Ambulatory Visit (HOSPITAL_COMMUNITY): Payer: Managed Care, Other (non HMO)

## 2012-02-14 ENCOUNTER — Telehealth: Payer: Self-pay | Admitting: Family Medicine

## 2012-02-15 ENCOUNTER — Telehealth: Payer: Self-pay | Admitting: Family Medicine

## 2012-02-15 DIAGNOSIS — E785 Hyperlipidemia, unspecified: Secondary | ICD-10-CM

## 2012-02-15 MED ORDER — PRAVASTATIN SODIUM 40 MG PO TABS: 40.0000 mg | ORAL_TABLET | Freq: Every evening | ORAL | Status: DC

## 2012-02-15 MED ORDER — TRIAMTERENE-HCTZ 37.5-25 MG PO TABS: 1.0000 | ORAL_TABLET | Freq: Every day | ORAL | Status: DC

## 2012-02-15 NOTE — Telephone Encounter (Signed)
Noted  

## 2012-02-15 NOTE — Telephone Encounter (Signed)
Spoke with pt she missed urology appt and did not hear about CT This will be rescheduled BP meds refilled, She completed antibiotics and no further blood in urine

## 2012-02-15 NOTE — Telephone Encounter (Signed)
I called and left voice message, I am trying to see if pt had CT scan or has seen urology

## 2012-02-15 NOTE — Telephone Encounter (Signed)
Pt aware.

## 2012-02-15 NOTE — Telephone Encounter (Signed)
Sent in

## 2012-02-15 NOTE — Telephone Encounter (Signed)
Addended by: Milinda Antis F on: 02/15/2012 08:38 AM   Modules accepted: Orders

## 2012-02-19 ENCOUNTER — Ambulatory Visit (HOSPITAL_COMMUNITY)
Admission: RE | Admit: 2012-02-19 | Discharge: 2012-02-19 | Disposition: A | Discharge status: Home / Self Care | Payer: Managed Care, Other (non HMO) | Source: Ambulatory Visit | Attending: Family Medicine | Admitting: Family Medicine

## 2012-02-19 DIAGNOSIS — Z87440 Personal history of urinary (tract) infections: Secondary | ICD-10-CM | POA: Insufficient documentation

## 2012-02-19 DIAGNOSIS — R319 Hematuria, unspecified: Secondary | ICD-10-CM | POA: Insufficient documentation

## 2012-02-19 DIAGNOSIS — R109 Unspecified abdominal pain: Secondary | ICD-10-CM | POA: Insufficient documentation

## 2012-02-19 DIAGNOSIS — N3090 Cystitis, unspecified without hematuria: Secondary | ICD-10-CM | POA: Insufficient documentation

## 2012-02-19 MED ORDER — IOHEXOL 300 MG/ML  SOLN
100.0000 mL | Freq: Once | INTRAMUSCULAR | Status: AC | PRN
  Administered 2012-02-19: 100 mL via INTRAVENOUS

## 2012-03-18 ENCOUNTER — Ambulatory Visit: Payer: Managed Care, Other (non HMO) | Admitting: Family Medicine

## 2012-08-22 ENCOUNTER — Other Ambulatory Visit: Payer: Self-pay | Admitting: Family Medicine

## 2012-08-22 DIAGNOSIS — Z139 Encounter for screening, unspecified: Secondary | ICD-10-CM

## 2012-09-09 ENCOUNTER — Ambulatory Visit (INDEPENDENT_AMBULATORY_CARE_PROVIDER_SITE_OTHER): Payer: Managed Care, Other (non HMO) | Admitting: Family Medicine

## 2012-09-09 ENCOUNTER — Encounter: Payer: Self-pay | Admitting: Family Medicine

## 2012-09-09 VITALS — BP 120/80 | HR 91 | Resp 18 | Ht 62.5 in | Wt 143.1 lb

## 2012-09-09 DIAGNOSIS — E785 Hyperlipidemia, unspecified: Secondary | ICD-10-CM

## 2012-09-09 DIAGNOSIS — Z8669 Personal history of other diseases of the nervous system and sense organs: Secondary | ICD-10-CM

## 2012-09-09 DIAGNOSIS — R0989 Other specified symptoms and signs involving the circulatory and respiratory systems: Secondary | ICD-10-CM | POA: Insufficient documentation

## 2012-09-09 DIAGNOSIS — B369 Superficial mycosis, unspecified: Secondary | ICD-10-CM | POA: Insufficient documentation

## 2012-09-09 DIAGNOSIS — I10 Essential (primary) hypertension: Secondary | ICD-10-CM

## 2012-09-09 MED ORDER — CLOTRIMAZOLE-BETAMETHASONE 1-0.05 % EX CREA: TOPICAL_CREAM | Freq: Two times a day (BID) | CUTANEOUS | Status: DC

## 2012-09-09 NOTE — Patient Instructions (Addendum)
CPE as before.  Keep mammogram apppt   Fasting lipid, cmp, TSH and CBc 12/26 or after  New medication prescribed for rash   You are referred for ultrasound to evaluate circulation to brain  You are referred for eye exam   We will do vision and memory screen in more detail

## 2012-09-09 NOTE — Progress Notes (Signed)
  Subjective:    Patient ID: Tanya [name], female    DOB: [birth year], 61 y.o.   MRN: [id number]  HPI The PT is here for follow up and re-evaluation of chronic medical conditions, medication management and review of any available recent lab and radiology data.  Preventive health is updated, specifically  Cancer screening and Immunization.   Questions or concerns regarding consultations or procedures which the PT has had in the interim are  addressed. The PT denies any adverse reactions to current medications since the last visit.  C/o blurry vision in the past several weeks worse, eye exam needs to be updated, denies localized weakness, numbness or any other neurologic symptoms, no associated headache    Review of Systems See HPI Denies recent fever or chills. Denies sinus pressure, nasal congestion, ear pain or sore throat. Denies chest congestion, productive cough or wheezing. Denies chest pains, palpitations and leg swelling Denies abdominal pain, nausea, vomiting,diarrhea or constipation.   Denies dysuria, frequency, hesitancy or incontinence. Denies joint pain, swelling and limitation in mobility. Denies headaches, seizures, numbness, or tingling. Denies depression, anxiety or insomnia. C/o pruritic rash and dry skin espescialy on back of neck        Objective:   Physical Exam Patient alert and oriented and in no cardiopulmonary distress.  HEENT: No facial asymmetry, EOMI, no sinus tenderness,  oropharynx pink and moist.  Neck supple no adenopathy.Carotid bruit  Chest: Clear to auscultation bilaterally.  CVS: S1, S2 no murmurs, no S3.  ABD: Soft non tender. Bowel sounds normal.  Ext: No edema  MS: Adequate ROM spine, shoulders, hips and knees.  Skin: Intact, fungal  rash noted.  Psych: Good eye contact, normal affect. Memory intact not anxious or depressed appearing.  CNS: CN 2-12 intact, power, tone and sensation normal throughout.        Assessment &  Plan:

## 2012-09-11 ENCOUNTER — Ambulatory Visit (HOSPITAL_COMMUNITY)
Admission: RE | Admit: 2012-09-11 | Discharge: 2012-09-11 | Disposition: A | Discharge status: Home / Self Care | Payer: Managed Care, Other (non HMO) | Source: Ambulatory Visit | Attending: Family Medicine | Admitting: Family Medicine

## 2012-09-11 ENCOUNTER — Other Ambulatory Visit: Payer: Self-pay

## 2012-09-11 DIAGNOSIS — Z8669 Personal history of other diseases of the nervous system and sense organs: Secondary | ICD-10-CM

## 2012-09-11 DIAGNOSIS — H538 Other visual disturbances: Secondary | ICD-10-CM | POA: Insufficient documentation

## 2012-09-11 DIAGNOSIS — B369 Superficial mycosis, unspecified: Secondary | ICD-10-CM

## 2012-09-11 DIAGNOSIS — I6529 Occlusion and stenosis of unspecified carotid artery: Secondary | ICD-10-CM | POA: Insufficient documentation

## 2012-09-11 MED ORDER — CLOTRIMAZOLE-BETAMETHASONE 1-0.05 % EX CREA: TOPICAL_CREAM | Freq: Two times a day (BID) | CUTANEOUS | Status: DC

## 2012-09-21 NOTE — Assessment & Plan Note (Signed)
Fungal rash on neck med prescribed

## 2012-09-21 NOTE — Assessment & Plan Note (Signed)
Hyperlipidemia:Low fat diet discussed and encouraged.  Updated lab needed 

## 2012-09-21 NOTE — Assessment & Plan Note (Signed)
Refer for carotid doppler  Based on symptoms repported

## 2012-09-21 NOTE — Assessment & Plan Note (Signed)
Controlled, no change in medication DASH diet and commitment to daily physical activity for a minimum of 30 minutes discussed and encouraged, as a part of hypertension management. The importance of attaining a healthy weight is also discussed.  

## 2012-09-21 NOTE — Assessment & Plan Note (Signed)
Refer for eye exam

## 2012-09-25 ENCOUNTER — Other Ambulatory Visit: Payer: Self-pay | Admitting: Family Medicine

## 2012-09-25 LAB — CBC
HCT: 38.6 % (ref 36.0–46.0)
Hemoglobin: 13.4 g/dL (ref 12.0–15.0)
MCHC: 34.7 g/dL (ref 30.0–36.0)
MCV: 90.2 fL (ref 78.0–100.0)
RDW: 14.1 % (ref 11.5–15.5)
WBC: 6 10*3/uL (ref 4.0–10.5)

## 2012-09-25 LAB — COMPREHENSIVE METABOLIC PANEL
AST: 34 U/L (ref 0–37)
Albumin: 4.4 g/dL (ref 3.5–5.2)
BUN: 19 mg/dL (ref 6–23)
CO2: 28 mEq/L (ref 19–32)
Calcium: 9.5 mg/dL (ref 8.4–10.5)
Chloride: 104 mEq/L (ref 96–112)
Creat: 0.73 mg/dL (ref 0.50–1.10)
Glucose, Bld: 94 mg/dL (ref 70–99)
Potassium: 3.6 mEq/L (ref 3.5–5.3)

## 2012-09-25 LAB — LIPID PANEL
HDL: 69 mg/dL (ref >39)
Triglycerides: 95 mg/dL (ref <150)

## 2012-09-29 ENCOUNTER — Ambulatory Visit (HOSPITAL_COMMUNITY)
Admission: RE | Admit: 2012-09-29 | Discharge: 2012-09-29 | Disposition: A | Discharge status: Home / Self Care | Payer: Managed Care, Other (non HMO) | Source: Ambulatory Visit | Attending: Family Medicine | Admitting: Family Medicine

## 2012-09-29 DIAGNOSIS — Z139 Encounter for screening, unspecified: Secondary | ICD-10-CM

## 2012-09-29 DIAGNOSIS — Z1231 Encounter for screening mammogram for malignant neoplasm of breast: Secondary | ICD-10-CM | POA: Insufficient documentation

## 2012-09-30 LAB — HEPATITIS PANEL, ACUTE
HCV Ab: NEGATIVE
Hep A IgM: NEGATIVE
Hepatitis B Surface Ag: NEGATIVE

## 2012-10-13 ENCOUNTER — Other Ambulatory Visit: Payer: Self-pay | Admitting: Family Medicine

## 2012-10-17 ENCOUNTER — Other Ambulatory Visit: Payer: Self-pay

## 2012-10-17 ENCOUNTER — Telehealth: Payer: Self-pay | Admitting: Family Medicine

## 2012-10-17 MED ORDER — TRIAMTERENE-HCTZ 37.5-25 MG PO TABS: 1.0000 | ORAL_TABLET | Freq: Every day | ORAL | Status: DC

## 2012-10-17 NOTE — Telephone Encounter (Signed)
Med sent.

## 2012-10-20 ENCOUNTER — Other Ambulatory Visit: Payer: Self-pay | Admitting: Family Medicine

## 2012-10-21 ENCOUNTER — Other Ambulatory Visit: Payer: Self-pay

## 2012-10-21 MED ORDER — TRIAMTERENE-HCTZ 37.5-25 MG PO TABS: 1.0000 | ORAL_TABLET | Freq: Every day | ORAL | Status: DC

## 2012-10-29 ENCOUNTER — Encounter: Payer: Managed Care, Other (non HMO) | Admitting: Family Medicine

## 2013-01-05 ENCOUNTER — Encounter: Payer: Self-pay | Admitting: Family Medicine

## 2013-01-05 ENCOUNTER — Ambulatory Visit (INDEPENDENT_AMBULATORY_CARE_PROVIDER_SITE_OTHER): Payer: Managed Care, Other (non HMO) | Admitting: Family Medicine

## 2013-01-05 VITALS — BP 122/64 | HR 97 | Resp 18 | Ht 62.5 in | Wt 146.0 lb

## 2013-01-05 DIAGNOSIS — N3946 Mixed incontinence: Secondary | ICD-10-CM | POA: Insufficient documentation

## 2013-01-05 DIAGNOSIS — R7989 Other specified abnormal findings of blood chemistry: Secondary | ICD-10-CM

## 2013-01-05 DIAGNOSIS — R3129 Other microscopic hematuria: Secondary | ICD-10-CM

## 2013-01-05 LAB — POCT URINALYSIS DIPSTICK
Leukocytes, UA: NEGATIVE
Nitrite, UA: NEGATIVE
Protein, UA: NEGATIVE
pH, UA: 6.5

## 2013-01-05 NOTE — Assessment & Plan Note (Signed)
Mildly elevated ALT, acute hepatitis panel was negative this was discussed with patient. Will have her repeat LFTs today she is on a statin drug but I do not see a reason her come off of this unless her LFTs are trending upward

## 2013-01-05 NOTE — Addendum Note (Signed)
Addended by: Kandis Fantasia B on: 01/05/2013 04:53 PM   Modules accepted: Orders

## 2013-01-05 NOTE — Assessment & Plan Note (Signed)
Persistent hematuria either microscopic or gross when she has urinary tract infections. Will send her to urology for evaluation along with her incontinence

## 2013-01-05 NOTE — Patient Instructions (Signed)
Appointment with urology Get the labs done today Keep previous appt with Dr. Lavena Stanford can try Aleve for your arthritis as well

## 2013-01-05 NOTE — Assessment & Plan Note (Signed)
Next urinary incontinence she has both stress incontinence as well as some urge incontinence. She is weary of medications I will have her fully evaluated by urology and let them discuss with her, her options

## 2013-01-05 NOTE — Progress Notes (Signed)
  Subjective:    Patient ID: Tanya [name], female    DOB: 1951/08/09, 62 y.o.   MRN: [id number]  HPI  Patient presents with urinary frequency over the past week. She's actually got with frequency for many months. She's also had recurrent UTIs with hematuria. CT scan of abdomen and pelvis was unremarkable in may of 2013. She did not followup with urology at that time. She tells me that her urine leaks she fell she does not completely empty her bladder she often has to go back to back. She also has accidents when she coughs or sneezes  She will like to review her mammogram as well as her labs  Review of Systems  GEN- denies fatigue, fever, weight loss,weakness, recent illness HEENT- denies eye drainage, change in vision, nasal discharge, CVS- denies chest pain, palpitations RESP- denies SOB, cough, wheeze ABD- denies N/V, change in stools, abd pain GU- denies dysuria, hematuria, dribbling, incontinence MSK- denies joint pain, muscle aches, injury Neuro- denies headache, dizziness, syncope, seizure activity      Objective:   Physical Exam  GEN- NAD, alert and oriented x3 HEENT- PERRL, EOMI, non injected sclera, pink conjunctiva, MMM, oropharynx clear CVS- RRR, no murmur RESP-CTAB ABD-NABS,soft,NT,ND, no HSM, no CVA tenderness EXT- No edema Pulses- Radial 2+, DP 2+       Assessment & Plan:   Mammogram reviewed, normal mammogram with dense breast tissue, no family history of Breast cancer or personal history, at this time MRI of breast not needed. Pt also brought in a PAD questionnaire, but did not meet requirements or true symptoms for PAD

## 2013-01-06 LAB — HEPATIC FUNCTION PANEL
ALT: 44 U/L — ABNORMAL HIGH (ref 0–35)
AST: 35 U/L (ref 0–37)
Albumin: 4.7 g/dL (ref 3.5–5.2)
Alkaline Phosphatase: 61 U/L (ref 39–117)

## 2013-02-06 ENCOUNTER — Ambulatory Visit (INDEPENDENT_AMBULATORY_CARE_PROVIDER_SITE_OTHER): Payer: Managed Care, Other (non HMO) | Admitting: Urology

## 2013-02-06 DIAGNOSIS — R351 Nocturia: Secondary | ICD-10-CM

## 2013-02-06 DIAGNOSIS — R350 Frequency of micturition: Secondary | ICD-10-CM

## 2013-02-06 DIAGNOSIS — N393 Stress incontinence (female) (male): Secondary | ICD-10-CM

## 2013-02-06 DIAGNOSIS — N952 Postmenopausal atrophic vaginitis: Secondary | ICD-10-CM

## 2013-02-06 DIAGNOSIS — Z87440 Personal history of urinary (tract) infections: Secondary | ICD-10-CM

## 2013-02-18 ENCOUNTER — Other Ambulatory Visit: Payer: Self-pay | Admitting: Family Medicine

## 2013-06-19 ENCOUNTER — Telehealth: Payer: Self-pay | Admitting: Family Medicine

## 2013-06-19 ENCOUNTER — Other Ambulatory Visit: Payer: Self-pay | Admitting: Family Medicine

## 2013-06-19 MED ORDER — TRIAMTERENE-HCTZ 37.5-25 MG PO TABS: 1.0000 | ORAL_TABLET | Freq: Every day | ORAL | Status: DC

## 2013-06-19 NOTE — Telephone Encounter (Signed)
Med sent in for 30 days only. Pharmacy aware that pt needs appt before any more refills will be given

## 2013-07-17 ENCOUNTER — Encounter (INDEPENDENT_AMBULATORY_CARE_PROVIDER_SITE_OTHER): Payer: Self-pay

## 2013-07-17 ENCOUNTER — Other Ambulatory Visit (HOSPITAL_COMMUNITY)
Admission: RE | Admit: 2013-07-17 | Discharge: 2013-07-17 | Disposition: A | Discharge status: Home / Self Care | Payer: Managed Care, Other (non HMO) | Source: Ambulatory Visit | Attending: Family Medicine | Admitting: Family Medicine

## 2013-07-17 ENCOUNTER — Encounter: Payer: Self-pay | Admitting: Family Medicine

## 2013-07-17 ENCOUNTER — Ambulatory Visit (INDEPENDENT_AMBULATORY_CARE_PROVIDER_SITE_OTHER): Payer: Managed Care, Other (non HMO) | Admitting: Family Medicine

## 2013-07-17 VITALS — BP 122/70 | HR 77 | Resp 16 | Ht 62.5 in | Wt 137.4 lb

## 2013-07-17 DIAGNOSIS — K219 Gastro-esophageal reflux disease without esophagitis: Secondary | ICD-10-CM

## 2013-07-17 DIAGNOSIS — Z1151 Encounter for screening for human papillomavirus (HPV): Secondary | ICD-10-CM | POA: Insufficient documentation

## 2013-07-17 DIAGNOSIS — Z130 Encounter for screening for diseases of the blood and blood-forming organs and certain disorders involving the immune mechanism: Secondary | ICD-10-CM

## 2013-07-17 DIAGNOSIS — Z01419 Encounter for gynecological examination (general) (routine) without abnormal findings: Secondary | ICD-10-CM | POA: Insufficient documentation

## 2013-07-17 DIAGNOSIS — R079 Chest pain, unspecified: Secondary | ICD-10-CM

## 2013-07-17 DIAGNOSIS — Z124 Encounter for screening for malignant neoplasm of cervix: Secondary | ICD-10-CM

## 2013-07-17 DIAGNOSIS — R7302 Impaired glucose tolerance (oral): Secondary | ICD-10-CM

## 2013-07-17 DIAGNOSIS — E785 Hyperlipidemia, unspecified: Secondary | ICD-10-CM

## 2013-07-17 DIAGNOSIS — Z0000 Encounter for general adult medical examination without abnormal findings: Secondary | ICD-10-CM

## 2013-07-17 DIAGNOSIS — R0789 Other chest pain: Secondary | ICD-10-CM | POA: Insufficient documentation

## 2013-07-17 DIAGNOSIS — Z1321 Encounter for screening for nutritional disorder: Secondary | ICD-10-CM

## 2013-07-17 DIAGNOSIS — Z1211 Encounter for screening for malignant neoplasm of colon: Secondary | ICD-10-CM

## 2013-07-17 DIAGNOSIS — R0989 Other specified symptoms and signs involving the circulatory and respiratory systems: Secondary | ICD-10-CM

## 2013-07-17 DIAGNOSIS — Z23 Encounter for immunization: Secondary | ICD-10-CM

## 2013-07-17 DIAGNOSIS — R7309 Other abnormal glucose: Secondary | ICD-10-CM

## 2013-07-17 DIAGNOSIS — I10 Essential (primary) hypertension: Secondary | ICD-10-CM

## 2013-07-17 MED ORDER — OMEPRAZOLE 20 MG PO CPDR: 20.0000 mg | DELAYED_RELEASE_CAPSULE | Freq: Every day | ORAL | Status: DC

## 2013-07-17 MED ORDER — TRIAMTERENE-HCTZ 37.5-25 MG PO TABS: 1.0000 | ORAL_TABLET | Freq: Every day | ORAL | Status: DC

## 2013-07-17 MED ORDER — PRAVASTATIN SODIUM 40 MG PO TABS: ORAL_TABLET | ORAL | Status: DC

## 2013-07-17 NOTE — Patient Instructions (Addendum)
F/u in 6 month, call if you need me before  TdAP and flu vaccine today  HBA1C , lipid, cbc, tsh , cmp and vit D tomorrow , or as soon as possible Pap sent Resume medication for reflux every day, I be;ieve this will relieve your chest pain.  Your EKG is normal today , no need to see heart specialist unless you continue to have chest pain despite reflux medicine

## 2013-07-17 NOTE — Assessment & Plan Note (Signed)
office EKG, NSR, no ischemia. Pt has been off her PPI, her chest pain has no relation to physical activity, does not radiate and has no associated nausea, light headed ness or diaphoresis. She is to resume her PPi and call if symptoms continue. She has  a very active lifestyle and denies pain with activity

## 2013-07-17 NOTE — Assessment & Plan Note (Signed)
Uncontrolled with c/o daily chest pain for past 3 month, pt to resume med. Will check H pylori status also

## 2013-07-17 NOTE — Progress Notes (Signed)
  Subjective:    Patient ID: Tanya [name], female    DOB: 1951/08/01, 62 y.o.   MRN: [id number]  HPI Pt in for annual exam She c/o left chest pain on a daily basis for the last 2 to 3 month, not related to activity specifically and no other accompanying symptoms or radiation. Duration is approximately 5 minutes, she is concerned that her heart is not the problem Has stopped taking her reflux medicine over this time also Requests TdAP and flu vaccine, both are due. Continues to remain active, and maintain a healthy active   Review of Systems See HPI Denies recent fever or chills. Denies sinus pressure, nasal congestion, ear pain or sore throat. Denies chest congestion, productive cough or wheezing. Denies PND, orthopnea, palpitations and leg swelling Denies abdominal pain, nausea, vomiting,diarrhea or constipation.   Denies dysuria, frequency, hesitancy or incontinence. Denies joint pain, swelling and limitation in mobility. Denies headaches, seizures, numbness, or tingling. Denies depression, anxiety or insomnia. Denies skin break down or rash.Shows small skin tag on left neck reassured benign        Objective:   Physical Exam  Pleasant well nourished female, alert and oriented x 3, in no cardio-pulmonary distress. Afebrile. HEENT No facial trauma or asymetry. Sinuses non tender.  EOMI, PERTL, fundoscopic exam  no hemorhage or exudate.  External ears normal, tympanic membranes clear. Oropharynx moist, no exudate, fair  dentition. Neck: supple, no adenopathy,JVD or thyromegaly. bruits.  Chest: Clear to ascultation bilaterally.No crackles or wheezes. Non tender to palpation  Breast: No asymetry,no masses. No nipple discharge or inversion. No axillary or supraclavicular adenopathy  Cardiovascular system; Heart sounds normal,  S1 and  S2 ,no S3.  No murmur, or thrill. Apical beat not displaced Peripheral pulses normal.  Abdomen: Soft, non tender, no organomegaly or  masses. No bruits. Bowel sounds normal. No guarding, tenderness or rebound.  Rectal:  No mass. Guaiac negative stool.  GU: External genitalia normal. No lesions. Vaginal canal normal.No discharge. Uterus normal size, no adnexal masses, no cervical motion or adnexal tenderness.  Musculoskeletal exam: Full ROM of spine, hips , shoulders and knees. No deformity ,swelling or crepitus noted. No muscle wasting or atrophy.   Neurologic: Cranial nerves 2 to 12 intact. Power, tone ,sensation and reflexes normal throughout. No disturbance in gait. No tremor.  Skin: Intact, no ulceration, erythema , scaling or rash noted. Pigmentation normal throughout  Psych; Normal mood and affect. Judgement and concentration normal       Assessment & Plan:

## 2013-07-17 NOTE — Assessment & Plan Note (Signed)
Controlled, no change in medication DASH diet and commitment to daily physical activity for a minimum of 30 minutes discussed and encouraged, as a part of hypertension management. The importance of attaining a healthy weight is also discussed.  

## 2013-07-17 NOTE — Assessment & Plan Note (Signed)
General medical exam as documented. Pt encouraged to continue healthy diet and regular physical activity. Immunization needs addressed today Updated labs needed

## 2013-07-18 LAB — COMPREHENSIVE METABOLIC PANEL
ALT: 15 U/L (ref 0–35)
Albumin: 4.3 g/dL (ref 3.5–5.2)
Alkaline Phosphatase: 48 U/L (ref 39–117)
CO2: 29 mEq/L (ref 19–32)
Potassium: 3.8 mEq/L (ref 3.5–5.3)
Sodium: 142 mEq/L (ref 135–145)
Total Bilirubin: 0.6 mg/dL (ref 0.3–1.2)
Total Protein: 7.2 g/dL (ref 6.0–8.3)

## 2013-07-18 LAB — CBC
Hemoglobin: 13.3 g/dL (ref 12.0–15.0)
MCH: 31.5 pg (ref 26.0–34.0)
MCHC: 34.8 g/dL (ref 30.0–36.0)
Platelets: 356 10*3/uL (ref 150–400)
RBC: 4.22 MIL/uL (ref 3.87–5.11)

## 2013-07-18 LAB — LIPID PANEL
Cholesterol: 158 mg/dL (ref 0–200)
LDL Cholesterol: 79 mg/dL (ref 0–99)
Total CHOL/HDL Ratio: 2.6 Ratio
Triglycerides: 91 mg/dL (ref <150)
VLDL: 18 mg/dL (ref 0–40)

## 2013-07-20 LAB — VITAMIN D 25 HYDROXY (VIT D DEFICIENCY, FRACTURES): Vit D, 25-Hydroxy: 48 ng/mL (ref 30–89)

## 2013-08-01 ENCOUNTER — Encounter: Payer: Self-pay | Admitting: Family Medicine

## 2013-08-31 ENCOUNTER — Other Ambulatory Visit: Payer: Self-pay | Admitting: Family Medicine

## 2013-08-31 DIAGNOSIS — Z139 Encounter for screening, unspecified: Secondary | ICD-10-CM

## 2013-10-02 ENCOUNTER — Ambulatory Visit (HOSPITAL_COMMUNITY)
Admission: RE | Admit: 2013-10-02 | Discharge: 2013-10-02 | Disposition: A | Discharge status: Home / Self Care | Payer: Managed Care, Other (non HMO) | Source: Ambulatory Visit | Attending: Family Medicine | Admitting: Family Medicine

## 2013-10-02 DIAGNOSIS — Z1231 Encounter for screening mammogram for malignant neoplasm of breast: Secondary | ICD-10-CM | POA: Insufficient documentation

## 2013-10-02 DIAGNOSIS — Z139 Encounter for screening, unspecified: Secondary | ICD-10-CM

## 2013-10-05 ENCOUNTER — Ambulatory Visit (HOSPITAL_COMMUNITY): Payer: Managed Care, Other (non HMO)

## 2013-11-02 ENCOUNTER — Telehealth: Payer: Self-pay

## 2013-11-02 MED ORDER — OSELTAMIVIR PHOSPHATE 75 MG PO CAPS: 75.0000 mg | ORAL_CAPSULE | Freq: Two times a day (BID) | ORAL | Status: DC

## 2013-11-02 NOTE — Telephone Encounter (Signed)
Acute onset of fever yes Headache yes Body ache yes Fatigue yes Non productive cough yes Sore throat yes Nasal discharge no  Onset between 1-4 days of possible exposure yes  Recommendation:  Fluid, rest, Tylenol for control of fever  Expect 5 days before feeling better and not so contagious and generally better in 7-10 days.  Standard treatment Tama flu 75 mg 1 tablet twice daily times 5 days  Please call office if symptoms do not improve or worsen in 5 days after beginning treatment.

## 2014-01-15 ENCOUNTER — Ambulatory Visit: Payer: Managed Care, Other (non HMO) | Admitting: Family Medicine

## 2014-01-16 ENCOUNTER — Other Ambulatory Visit: Payer: Self-pay | Admitting: Family Medicine

## 2014-01-17 ENCOUNTER — Encounter (HOSPITAL_COMMUNITY): Payer: Self-pay

## 2014-01-18 ENCOUNTER — Other Ambulatory Visit: Payer: Self-pay

## 2014-01-18 MED ORDER — TRIAMTERENE-HCTZ 37.5-25 MG PO TABS: 1.0000 | ORAL_TABLET | Freq: Every day | ORAL | Status: DC

## 2014-01-19 ENCOUNTER — Encounter: Payer: Self-pay | Admitting: Family Medicine

## 2014-01-19 ENCOUNTER — Ambulatory Visit (INDEPENDENT_AMBULATORY_CARE_PROVIDER_SITE_OTHER): Payer: Managed Care, Other (non HMO) | Admitting: Family Medicine

## 2014-01-19 ENCOUNTER — Encounter (INDEPENDENT_AMBULATORY_CARE_PROVIDER_SITE_OTHER): Payer: Self-pay

## 2014-01-19 ENCOUNTER — Ambulatory Visit (HOSPITAL_COMMUNITY)
Admission: RE | Admit: 2014-01-19 | Discharge: 2014-01-19 | Disposition: A | Discharge status: Home / Self Care | Payer: Managed Care, Other (non HMO) | Source: Ambulatory Visit | Attending: Family Medicine | Admitting: Family Medicine

## 2014-01-19 VITALS — BP 140/82 | HR 71 | Resp 16 | Ht 62.5 in | Wt 142.0 lb

## 2014-01-19 DIAGNOSIS — R0609 Other forms of dyspnea: Secondary | ICD-10-CM

## 2014-01-19 DIAGNOSIS — R0600 Dyspnea, unspecified: Secondary | ICD-10-CM

## 2014-01-19 DIAGNOSIS — H6090 Unspecified otitis externa, unspecified ear: Secondary | ICD-10-CM | POA: Insufficient documentation

## 2014-01-19 DIAGNOSIS — K219 Gastro-esophageal reflux disease without esophagitis: Secondary | ICD-10-CM

## 2014-01-19 DIAGNOSIS — Z13820 Encounter for screening for osteoporosis: Secondary | ICD-10-CM

## 2014-01-19 DIAGNOSIS — R0989 Other specified symptoms and signs involving the circulatory and respiratory systems: Secondary | ICD-10-CM

## 2014-01-19 DIAGNOSIS — R0789 Other chest pain: Secondary | ICD-10-CM

## 2014-01-19 DIAGNOSIS — H6093 Unspecified otitis externa, bilateral: Secondary | ICD-10-CM

## 2014-01-19 DIAGNOSIS — I10 Essential (primary) hypertension: Secondary | ICD-10-CM

## 2014-01-19 DIAGNOSIS — R195 Other fecal abnormalities: Secondary | ICD-10-CM

## 2014-01-19 DIAGNOSIS — E785 Hyperlipidemia, unspecified: Secondary | ICD-10-CM

## 2014-01-19 DIAGNOSIS — H60399 Other infective otitis externa, unspecified ear: Secondary | ICD-10-CM

## 2014-01-19 MED ORDER — TRIAMCINOLONE ACETONIDE 0.025 % EX LOTN: TOPICAL_LOTION | CUTANEOUS | Status: DC

## 2014-01-19 MED ORDER — TRIAMTERENE-HCTZ 37.5-25 MG PO TABS: 1.0000 | ORAL_TABLET | Freq: Every day | ORAL | Status: DC

## 2014-01-19 NOTE — Progress Notes (Signed)
   Subjective:    Patient ID: Tanya [name], female    DOB: 1951-07-05, 63 y.o.   MRN: [id number]  HPI  The PT is here for follow up and re-evaluation of chronic medical conditions, medication management and review of any available recent lab and radiology data.  Preventive health is updated, specifically  Cancer screening and Immunization.   . The PT denies any adverse reactions to current medications since the last visit.  Anal itching over 6 month, concerned about possible pin worm infection  Increased SOB with activity going upstairs x 4 month, also c/o inncreased chest pain episodes in 4 months, sometimes light headed with episodes, mother died in her 88's and her Dad same age of MI  C/o excessive itching in both ears, no drainage, pain or hearing loss, wose in past 1 month      Review of Systems See HPI Denies recent fever or chills. Denies sinus pressure, nasal congestion, ear pain or sore throat. Denies chest congestion, productive cough or wheezing. Denies PND, orthopnea, palpitations and leg swelling Denies abdominal pain, nausea, vomiting   Denies dysuria, frequency, hesitancy or incontinence. Denies joint pain, swelling and limitation in mobility. Denies headaches, seizures, numbness, or tingling. Denies depression, anxiety or insomnia. Denies skin break down or rash.        Objective:   Physical Exam  BP 140/82  Pulse 71  Resp 16  Ht 5' 2.5" (1.588 m)  Wt 142 lb (64.411 kg)  BMI 25.54 kg/m2  SpO2 100% Patient alert and oriented and in no cardiopulmonary distress.  HEENT: No facial asymmetry, EOMI,   oropharynx pink and moist.  Neck supple no JVD, no mass. TM clear, external canal has flaking and mild erythema of the skin Chest: Clear to auscultation bilaterally.no reproducible chest wall tenderness  CVS: S1, S2 no murmurs, no S3.  ABD: Soft non tender.   Ext: No edema  MS: Adequate ROM spine, shoulders, hips and knees.  Skin: Intact, no  ulcerations or rash noted.  Psych: Good eye contact, normal affect. Memory intact not anxious or depressed appearing.  CNS: CN 2-12 intact, power,  normal throughout.no focal deficits noted.       Assessment & Plan:  HYPERTENSION Controlled, no change in medication DASH diet and commitment to daily physical activity for a minimum of 30 minutes discussed and encouraged, as a part of hypertension management. The importance of attaining a healthy weight is also discussed.   HYPERLIPIDEMIA Controlled when last checked Updated lab needed at/ before next visit. Hyperlipidemia:Low fat diet discussed and encouraged.     Atypical chest pain Recurrent c/o chest pain , now with dyspnea, will obtain cardiology consult, EKG has been norma in recent past, not experiencing pain at time of visit  Dyspnea C/o increased dyspnea with activity and reduced exercise tolerance , will obtain cXR to further eval and refer to cardiology also  Otitis externa Topical anti inflammatory twice daily for  5 days then as needed, no evidence of bacterial infection  GERD (gastroesophageal reflux disease) Controlled, no change in medication   Abnormal stools C/o excess perianal itch, concerned about possible worm infestation, denies mucus , blood, generalized malaise Pt to submit  stool for testing of O/P Ok for her to empirically to use OTC medication for one time treatment once specimen submitted, she has a lot of animals at [employer]

## 2014-01-19 NOTE — Patient Instructions (Addendum)
F/u in 4.5 month, call if you need me ebfore  CXR today for shortness of breath and you are also referred to cardiology for further evaluation of this as well as your chest pain  You need to submit stool for testing for worms, we will give you the container and order, take to lab, once submitted I will send medication to treat presumed infection   Fasting lipid and cmp as soon as possible , this is due  Medication prescribed for itchy ears, use sparingly, only as needed, maximum 3 to 5 days per month

## 2014-01-20 LAB — OVA AND PARASITE EXAMINATION: OP: NONE SEEN

## 2014-01-25 ENCOUNTER — Telehealth: Payer: Self-pay

## 2014-01-25 NOTE — Telephone Encounter (Signed)
Patient aware of results.

## 2014-01-26 ENCOUNTER — Ambulatory Visit (HOSPITAL_COMMUNITY)
Admission: RE | Admit: 2014-01-26 | Discharge: 2014-01-26 | Disposition: A | Discharge status: Home / Self Care | Payer: Managed Care, Other (non HMO) | Source: Ambulatory Visit | Attending: Family Medicine | Admitting: Family Medicine

## 2014-01-26 DIAGNOSIS — M949 Disorder of cartilage, unspecified: Principal | ICD-10-CM

## 2014-01-26 DIAGNOSIS — Z13820 Encounter for screening for osteoporosis: Secondary | ICD-10-CM

## 2014-01-26 DIAGNOSIS — M899 Disorder of bone, unspecified: Secondary | ICD-10-CM | POA: Insufficient documentation

## 2014-01-26 DIAGNOSIS — Z780 Asymptomatic menopausal state: Secondary | ICD-10-CM | POA: Insufficient documentation

## 2014-01-27 ENCOUNTER — Encounter: Payer: Self-pay | Admitting: Family Medicine

## 2014-01-27 DIAGNOSIS — M8580 Other specified disorders of bone density and structure, unspecified site: Secondary | ICD-10-CM | POA: Insufficient documentation

## 2014-01-30 LAB — COMPREHENSIVE METABOLIC PANEL
ALBUMIN: 4.7 g/dL (ref 3.5–5.2)
ALT: 32 U/L (ref 0–35)
AST: 30 U/L (ref 0–37)
Alkaline Phosphatase: 58 U/L (ref 39–117)
BILIRUBIN TOTAL: 0.4 mg/dL (ref 0.2–1.2)
BUN: 19 mg/dL (ref 6–23)
CO2: 30 mEq/L (ref 19–32)
Calcium: 10.1 mg/dL (ref 8.4–10.5)
Chloride: 102 mEq/L (ref 96–112)
Creat: 0.72 mg/dL (ref 0.50–1.10)
Glucose, Bld: 98 mg/dL (ref 70–99)
Potassium: 3.7 mEq/L (ref 3.5–5.3)
SODIUM: 141 meq/L (ref 135–145)
TOTAL PROTEIN: 7.9 g/dL (ref 6.0–8.3)

## 2014-01-30 LAB — LIPID PANEL
Cholesterol: 215 mg/dL — ABNORMAL HIGH (ref 0–200)
HDL: 68 mg/dL (ref >39)
LDL CALC: 118 mg/dL — AB (ref 0–99)
Total CHOL/HDL Ratio: 3.2 Ratio
Triglycerides: 145 mg/dL (ref <150)
VLDL: 29 mg/dL (ref 0–40)

## 2014-01-31 ENCOUNTER — Other Ambulatory Visit: Payer: Self-pay | Admitting: Family Medicine

## 2014-02-09 ENCOUNTER — Telehealth: Payer: Self-pay | Admitting: Family Medicine

## 2014-02-10 ENCOUNTER — Encounter: Payer: Self-pay | Admitting: Cardiovascular Disease

## 2014-02-10 ENCOUNTER — Encounter: Payer: Self-pay | Admitting: *Deleted

## 2014-02-10 ENCOUNTER — Ambulatory Visit (INDEPENDENT_AMBULATORY_CARE_PROVIDER_SITE_OTHER): Payer: Managed Care, Other (non HMO) | Admitting: Cardiovascular Disease

## 2014-02-10 VITALS — BP 120/58 | HR 72 | Ht 63.0 in | Wt 143.0 lb

## 2014-02-10 DIAGNOSIS — R079 Chest pain, unspecified: Secondary | ICD-10-CM

## 2014-02-10 DIAGNOSIS — R0609 Other forms of dyspnea: Secondary | ICD-10-CM

## 2014-02-10 DIAGNOSIS — Z8679 Personal history of other diseases of the circulatory system: Secondary | ICD-10-CM

## 2014-02-10 DIAGNOSIS — E785 Hyperlipidemia, unspecified: Secondary | ICD-10-CM

## 2014-02-10 DIAGNOSIS — I10 Essential (primary) hypertension: Secondary | ICD-10-CM

## 2014-02-10 DIAGNOSIS — R0989 Other specified symptoms and signs involving the circulatory and respiratory systems: Secondary | ICD-10-CM

## 2014-02-10 MED ORDER — PRAVASTATIN SODIUM 80 MG PO TABS: 80.0000 mg | ORAL_TABLET | Freq: Every day | ORAL | Status: DC

## 2014-02-10 NOTE — Telephone Encounter (Signed)
Patient aware of labs and med change sent to pharmacy

## 2014-02-10 NOTE — Progress Notes (Signed)
Patient ID: Tanya [name], female   DOB: 07-05-1951, 63 y.o.   MRN: [id number]       CARDIOLOGY CONSULT NOTE  Patient ID: Tanya [name] MRN: [id number] DOB/AGE: [birth year] 63 y.o.  Admit date: (Not on file) Primary Physician Tula Nakayama, MD  Reason for Consultation: chest pain, DOE  HPI: The patient is a 63 year old woman with a past medical history significant for hypertension, hyperlipidemia, and gastroesophageal reflux disease. She has been complaining of left-sided chest pain for the past 3-4 but there has been no specific relation to activity. For a time, she had stopped taking her GERD medications. After these were restarted, she continued to have chest pain. She has also been complaining of shortness of breath with exertion and climbing stairs for the past 4 months. During my examination, she describes shortness of breath. Chest x-ray on 4/21 showed no acute abnormalities and minimal cardiomegaly. Other day she was lifting and cleaning her dog house and she began to experience left shoulder pain. This has subsided to a great degree but she still has some residual pain. She denies orthopnea, leg swelling, palpitations, and paroxysmal nocturnal dyspnea. She seldom experiences lightheadedness and dizziness, but denies syncope.  ECG in the office today shows normal sinus rhythm with no diagnostic ST segment or T wave abnormalities.  Carotid Dopplers in December 2013 revealed mild bilateral plaque disease.  Fam: Mother and father died between the ages of 35-60 of MI.  No Known Allergies  Current Outpatient Prescriptions  Medication Sig Dispense Refill  . aspirin 81 MG tablet Take 81 mg by mouth daily.      . fish oil-omega-3 fatty acids 1000 MG capsule Take 2 g by mouth daily.      Marland Kitchen omeprazole (PRILOSEC) 20 MG capsule Take 1 capsule (20 mg total) by mouth daily.  90 capsule  1  . pravastatin (PRAVACHOL) 80 MG tablet Take 1 tablet (80 mg total) by mouth daily.  30 tablet  5   . Triamcinolone Acetonide 0.025 % LOTN Apply opne drop to affected  ear twice daily for 5  Days, then as needed. Maximum use is twice per month  1 Bottle  0  . triamterene-hydrochlorothiazide (MAXZIDE-25) 37.5-25 MG per tablet Take 1 tablet by mouth daily.  30 tablet  3  . vitamin C (ASCORBIC ACID) 500 MG tablet Take 500 mg by mouth daily.       No current facility-administered medications for this visit.    Past Medical History  Diagnosis Date  . Cystitis   . Anhedonia   . Unspecified hypothyroidism   . Hyperlipidemia   . Insomnia   . Hypertension   . Vaginitis   . GERD (gastroesophageal reflux disease)     No past surgical history on file.  History   Social History  . Marital Status: Married    Spouse Name: N/A    Number of Children: N/A  . Years of Education: N/A   Occupational History  . Not on file.   Social History Main Topics  . Smoking status: Never Smoker   . Smokeless tobacco: Not on file  . Alcohol Use: No  . Drug Use: No  . Sexual Activity: Not on file   Other Topics Concern  . Not on file   Social History Narrative   Pt originally from the [place] been in [place] for 20 years        Prior to Admission medications   Medication Sig Start Date End Date Taking? Authorizing  Provider  aspirin 81 MG tablet Take 81 mg by mouth daily.    Historical Provider, MD  fish oil-omega-3 fatty acids 1000 MG capsule Take 2 g by mouth daily.    Historical Provider, MD  omeprazole (PRILOSEC) 20 MG capsule Take 1 capsule (20 mg total) by mouth daily. 07/17/13 07/17/14  [name], MD  pravastatin (PRAVACHOL) 80 MG tablet Take 1 tablet (80 mg total) by mouth daily. 02/10/14   [name], MD  Triamcinolone Acetonide 0.025 % LOTN Apply opne drop to affected  ear twice daily for 5  Days, then as needed. Maximum use is twice per month 01/19/14   [name], MD  triamterene-hydrochlorothiazide (MAXZIDE-25) 37.5-25 MG per tablet Take 1 tablet by mouth  daily. 01/19/14   [name], MD  vitamin C (ASCORBIC ACID) 500 MG tablet Take 500 mg by mouth daily.    Historical Provider, MD     Review of systems complete and found to be negative unless listed above in HPI     Physical exam BP 120/58 Pulse 72   General: NAD Neck: No JVD, no thyromegaly or thyroid nodule.  Lungs: Clear to auscultation bilaterally with normal respiratory effort. CV: Nondisplaced PMI.  Heart regular S1/S2, no S3/S4, no murmur.  No peripheral edema.  No carotid bruit.  Normal pedal pulses.  Abdomen: Soft, nontender, no hepatosplenomegaly, no distention.  Skin: Intact without lesions or rashes.  Neurologic: Alert and oriented x 3.  Psych: Normal affect. Extremities: No clubbing or cyanosis.  HEENT: Normal.   Labs:   Lab Results  Component Value Date   WBC 5.5 07/17/2013   HGB 13.3 07/17/2013   HCT 38.2 07/17/2013   MCV 90.5 07/17/2013   PLT 356 07/17/2013   No results found for this basename: NA, K, CL, CO2, BUN, CREATININE, CALCIUM, LABALBU, PROT, BILITOT, ALKPHOS, ALT, AST, GLUCOSE,  in the last 168 hours Lab Results  Component Value Date   CKTOTAL 166 07/14/2008    Lab Results  Component Value Date   CHOL 215* 01/30/2014   CHOL 158 07/17/2013   CHOL 173 09/25/2012   Lab Results  Component Value Date   HDL 68 01/30/2014   HDL 61 07/17/2013   HDL 69 09/25/2012   Lab Results  Component Value Date   LDLCALC 118* 01/30/2014   LDLCALC 79 07/17/2013   LDLCALC 85 09/25/2012   Lab Results  Component Value Date   TRIG 145 01/30/2014   TRIG 91 07/17/2013   TRIG 95 09/25/2012   Lab Results  Component Value Date   CHOLHDL 3.2 01/30/2014   CHOLHDL 2.6 07/17/2013   CHOLHDL 2.5 09/25/2012   No results found for this basename: LDLDIRECT         Studies: No results found.  ASSESSMENT AND PLAN:  1. Chest pain and dyspnea with exertion: Given her symptoms and family history of premature coronary artery disease, I will proceed with an  echocardiogram to evaluate left ventricular systolic function and regional wall motion. I will obtain an exercise Cardiolite to evaluate for myocardial ischemia. 2. Hypertension: Well controlled on Maxzide 37.5/25 mg daily. 3. Hyperlipidemia: Currently on pravastatin 80 mg daily. Lipids on 07/17/2013 showed triglycerides 91, HDL 61, LDL 79.  Dispo: f/u 1 month.  Signed: [name], M.D., F.A.C.C.  02/10/2014, 2:24 PM

## 2014-02-10 NOTE — Patient Instructions (Signed)
Your physician recommends that you schedule a follow-up appointment in: 1 month with Dr Bronson Ing   Your physician has requested that you have an echocardiogram. Echocardiography is a painless test that uses sound waves to create images of your heart. It provides your doctor with information about the size and shape of your heart and how well your heart's chambers and valves are working. This procedure takes approximately one hour. There are no restrictions for this procedure.   Your physician has requested that you have en exercise stress myoview. For further information please visit [email or web address]. Please follow instruction sheet, as given.

## 2014-02-18 ENCOUNTER — Encounter (HOSPITAL_COMMUNITY)
Admission: RE | Admit: 2014-02-18 | Discharge: 2014-02-18 | Disposition: A | Discharge status: Home / Self Care | Payer: Managed Care, Other (non HMO) | Source: Ambulatory Visit | Attending: Cardiovascular Disease | Admitting: Cardiovascular Disease

## 2014-02-18 ENCOUNTER — Encounter (HOSPITAL_COMMUNITY): Payer: Self-pay

## 2014-02-18 ENCOUNTER — Ambulatory Visit (HOSPITAL_COMMUNITY)
Admission: RE | Admit: 2014-02-18 | Discharge: 2014-02-18 | Disposition: A | Discharge status: Home / Self Care | Payer: Managed Care, Other (non HMO) | Source: Ambulatory Visit | Attending: Cardiovascular Disease | Admitting: Cardiovascular Disease

## 2014-02-18 DIAGNOSIS — I2510 Atherosclerotic heart disease of native coronary artery without angina pectoris: Secondary | ICD-10-CM | POA: Insufficient documentation

## 2014-02-18 DIAGNOSIS — R0989 Other specified symptoms and signs involving the circulatory and respiratory systems: Secondary | ICD-10-CM | POA: Insufficient documentation

## 2014-02-18 DIAGNOSIS — E785 Hyperlipidemia, unspecified: Secondary | ICD-10-CM | POA: Insufficient documentation

## 2014-02-18 DIAGNOSIS — R0789 Other chest pain: Secondary | ICD-10-CM

## 2014-02-18 DIAGNOSIS — I10 Essential (primary) hypertension: Secondary | ICD-10-CM | POA: Insufficient documentation

## 2014-02-18 DIAGNOSIS — R0609 Other forms of dyspnea: Secondary | ICD-10-CM | POA: Insufficient documentation

## 2014-02-18 DIAGNOSIS — I517 Cardiomegaly: Secondary | ICD-10-CM

## 2014-02-18 DIAGNOSIS — R079 Chest pain, unspecified: Secondary | ICD-10-CM

## 2014-02-18 DIAGNOSIS — R0600 Dyspnea, unspecified: Secondary | ICD-10-CM

## 2014-02-18 DIAGNOSIS — Z8679 Personal history of other diseases of the circulatory system: Secondary | ICD-10-CM

## 2014-02-18 DIAGNOSIS — Z8249 Family history of ischemic heart disease and other diseases of the circulatory system: Secondary | ICD-10-CM | POA: Insufficient documentation

## 2014-02-18 MED ORDER — TECHNETIUM TC 99M SESTAMIBI - CARDIOLITE
30.0000 | Freq: Once | INTRAVENOUS | Status: AC | PRN
  Administered 2014-02-18: 11:00:00 30 via INTRAVENOUS

## 2014-02-18 MED ORDER — TECHNETIUM TC 99M SESTAMIBI GENERIC - CARDIOLITE
10.0000 | Freq: Once | INTRAVENOUS | Status: AC | PRN
  Administered 2014-02-18: 10 via INTRAVENOUS

## 2014-02-18 MED ORDER — SODIUM CHLORIDE 0.9 % IJ SOLN
INTRAMUSCULAR | Status: AC
Start: 2014-02-18 — End: 2014-02-18
  Administered 2014-02-18: 10 mL via INTRAVENOUS
  Filled 2014-02-18: qty 10

## 2014-02-18 NOTE — Progress Notes (Signed)
  Echocardiogram 2D Echocardiogram has been performed.  Tanya [name] 02/18/2014, 9:20 AM

## 2014-02-18 NOTE — Progress Notes (Signed)
Stress Lab Nurses Notes - [place] A [facility] 02/18/2014 Reason for doing test: Chest Pain and Dyspnea Type of test: Stress Myoview Nurse performing test: Carvel Getting, RN Nuclear Medicine Tech: Melburn Hake Echo Tech: Not Applicable MD performing test: Koneswaran/M. Bonnell Public PA Family MD: Tula Nakayama Test explained and consent signed: yes IV started: 22g jelco, Saline lock flushed, No redness or edema and Saline lock started in radiology Symptoms: fatigue and sob Treatment/Intervention: None Reason test stopped: reached target HR After recovery IV was: Discontinued via X-ray tech, No redness or edema and Saline Lock flushed Patient to return to [facility]. Med at : Patient discharged: Home Patient's Condition upon discharge was: stable Comments: Patient walked 7 minutes and 58 seconds. Exercise HR was 64 and BP was 128/72 and peak HR was 139 and peak BP was 212/69. Symptoms resolved in recovery. Norlene Duel

## 2014-03-23 ENCOUNTER — Ambulatory Visit: Payer: Managed Care, Other (non HMO) | Admitting: Cardiovascular Disease

## 2014-03-25 DIAGNOSIS — R195 Other fecal abnormalities: Secondary | ICD-10-CM | POA: Insufficient documentation

## 2014-03-25 NOTE — Assessment & Plan Note (Signed)
C/o increased dyspnea with activity and reduced exercise tolerance , will obtain cXR to further eval and refer to cardiology also

## 2014-03-25 NOTE — Assessment & Plan Note (Signed)
Controlled, no change in medication  

## 2014-03-25 NOTE — Assessment & Plan Note (Signed)
Topical anti inflammatory twice daily for  5 days then as needed, no evidence of bacterial infection

## 2014-03-25 NOTE — Assessment & Plan Note (Signed)
Recurrent c/o chest pain , now with dyspnea, will obtain cardiology consult, EKG has been norma in recent past, not experiencing pain at time of visit

## 2014-03-25 NOTE — Assessment & Plan Note (Signed)
Controlled when last checked Updated lab needed at/ before next visit. Hyperlipidemia:Low fat diet discussed and encouraged.

## 2014-03-25 NOTE — Assessment & Plan Note (Signed)
C/o excess perianal itch, concerned about possible worm infestation, denies mucus , blood, generalized malaise Pt to submit  stool for testing of O/P Ok for her to empirically to use OTC medication for one time treatment once specimen submitted, she has a lot of animals at [employer]

## 2014-03-25 NOTE — Assessment & Plan Note (Signed)
Controlled, no change in medication DASH diet and commitment to daily physical activity for a minimum of 30 minutes discussed and encouraged, as a part of hypertension management. The importance of attaining a healthy weight is also discussed.  

## 2014-03-26 ENCOUNTER — Encounter: Payer: Self-pay | Admitting: *Deleted

## 2014-04-20 ENCOUNTER — Encounter: Payer: Self-pay | Admitting: *Deleted

## 2014-04-20 ENCOUNTER — Other Ambulatory Visit: Payer: Self-pay | Admitting: Family Medicine

## 2014-05-18 ENCOUNTER — Telehealth: Payer: Self-pay | Admitting: Family Medicine

## 2014-05-18 MED ORDER — OMEPRAZOLE 20 MG PO CPDR: DELAYED_RELEASE_CAPSULE | ORAL | Status: DC

## 2014-05-18 NOTE — Telephone Encounter (Signed)
Med sent.

## 2014-06-11 ENCOUNTER — Ambulatory Visit (INDEPENDENT_AMBULATORY_CARE_PROVIDER_SITE_OTHER): Payer: Managed Care, Other (non HMO) | Admitting: Family Medicine

## 2014-06-11 ENCOUNTER — Encounter (INDEPENDENT_AMBULATORY_CARE_PROVIDER_SITE_OTHER): Payer: Self-pay

## 2014-06-11 ENCOUNTER — Encounter: Payer: Self-pay | Admitting: Family Medicine

## 2014-06-11 VITALS — BP 110/72 | HR 62 | Resp 16 | Ht 62.5 in | Wt 140.0 lb

## 2014-06-11 DIAGNOSIS — R51 Headache: Secondary | ICD-10-CM

## 2014-06-11 DIAGNOSIS — E785 Hyperlipidemia, unspecified: Secondary | ICD-10-CM

## 2014-06-11 DIAGNOSIS — I10 Essential (primary) hypertension: Secondary | ICD-10-CM

## 2014-06-11 DIAGNOSIS — K219 Gastro-esophageal reflux disease without esophagitis: Secondary | ICD-10-CM

## 2014-06-11 DIAGNOSIS — L259 Unspecified contact dermatitis, unspecified cause: Secondary | ICD-10-CM

## 2014-06-11 DIAGNOSIS — R7302 Impaired glucose tolerance (oral): Secondary | ICD-10-CM | POA: Insufficient documentation

## 2014-06-11 DIAGNOSIS — Z8669 Personal history of other diseases of the nervous system and sense organs: Secondary | ICD-10-CM

## 2014-06-11 DIAGNOSIS — L309 Dermatitis, unspecified: Secondary | ICD-10-CM

## 2014-06-11 DIAGNOSIS — R7309 Other abnormal glucose: Secondary | ICD-10-CM

## 2014-06-11 MED ORDER — CETIRIZINE HCL 10 MG PO TABS: 10.0000 mg | ORAL_TABLET | Freq: Every day | ORAL | Status: DC

## 2014-06-11 MED ORDER — CLOBETASOL PROPIONATE 0.05 % EX CREA: 1.0000 "application " | TOPICAL_CREAM | Freq: Two times a day (BID) | CUTANEOUS | Status: DC

## 2014-06-11 NOTE — Patient Instructions (Signed)
CPE and pap in 4 month,call if you need me before  Fasting lipid, cmp, hBA1C and TSH Sept 26 or Oct 3  New medications for itch are zyrtec one daily and steroid cream prescribed to be used sparingly when needed  You are  Referred for eye exam

## 2014-06-11 NOTE — Assessment & Plan Note (Signed)
Patient educated about the importance of limiting  Carbohydrate intake , the need to commit to daily physical activity for a minimum of 30 minutes , and to commit weight loss. The fact that changes in all these areas will reduce or eliminate all together the development of diabetes is stressed.   Updated lab needed at/ before next visit.  

## 2014-06-11 NOTE — Assessment & Plan Note (Signed)
Hyperlipidemia:Low fat diet discussed and encouraged.  Updated lab needed at/ before next visit.  

## 2014-06-11 NOTE — Assessment & Plan Note (Signed)
No eye exam  Ion years will refer, also c/o intermittent headaches

## 2014-06-11 NOTE — Assessment & Plan Note (Signed)
Controlled, no change in medication DASH diet and commitment to daily physical activity for a minimum of 30 minutes discussed and encouraged, as a part of hypertension management. The importance of attaining a healthy weight is also discussed.  

## 2014-06-12 NOTE — Assessment & Plan Note (Signed)
Controlled, no change in medication  

## 2014-06-12 NOTE — Progress Notes (Signed)
   Subjective:    Patient ID: Tanya [name], female    DOB: 1950/11/27, 63 y.o.   MRN: [id number]  HPI The PT is here for follow up and re-evaluation of chronic medical conditions, medication management and review of any available recent lab and radiology data.  Preventive health is updated, specifically  Cancer screening and Immunization.   Questions or concerns regarding consultations or procedures which the PT has had in the interim are  Addressed.She has cardiology eval for chest pain chronic, no pathology found The PT denies any adverse reactions to current medications since the last visit.  C/o generalized itching followed by a rash in area she has scratched. No new personal products or detergents, has had good success in the past with topical steroid. For past 3 weeks C/o intermittent sharp pains in her head when she turns her head a certain way for past 2 weks    Review of Systems .See HPI Denies recent fever or chills. Denies sinus pressure, nasal congestion, ear pain or sore throat. Denies chest congestion, productive cough or wheezing.Still c/o shortness of breath and states she will be tested for asthma Denies chest pains, palpitations and leg swelling Denies abdominal pain, nausea, vomiting,diarrhea or constipation.   Denies dysuria, frequency, hesitancy or incontinence. Denies joint pain, swelling and limitation in mobility. Denies  seizures, numbness, or tingling. Denies depression, anxiety or insomnia.        Objective:   Physical Exam BP 110/72  Pulse 62  Resp 16  Ht 5' 2.5" (1.588 m)  Wt 140 lb (63.504 kg)  BMI 25.18 kg/m2  SpO2 98% Patient alert and oriented and in no cardiopulmonary distress.  HEENT: No facial asymmetry, EOMI,   oropharynx pink and moist.  Neck supple no JVD, no mass.  Chest: Clear to auscultation bilaterally.  CVS: S1, S2 no murmurs, no S3.Regular rate.  ABD: Soft non tender.   Ext: No edema  MS: Adequate ROM spine, shoulders,  hips and knees.  Skin: Intact, erythematous macular rash on left anterior thigh, no warmth or drainage from area  Psych: Good eye contact, normal affect. Memory intact not anxious or depressed appearing.  CNS: CN 2-12 intact, power,  normal throughout.no focal deficits noted.        Assessment & Plan:  HYPERTENSION Controlled, no change in medication DASH diet and commitment to daily physical activity for a minimum of 30 minutes discussed and encouraged, as a part of hypertension management. The importance of attaining a healthy weight is also discussed.   HYPERLIPIDEMIA Hyperlipidemia:Low fat diet discussed and encouraged.  Updated lab needed at/ before next visit.   Hx of blurred vision No eye exam  Ion years will refer, also c/o intermittent headaches  IGT (impaired glucose tolerance) Patient educated about the importance of limiting  Carbohydrate intake , the need to commit to daily physical activity for a minimum of 30 minutes , and to commit weight loss. The fact that changes in all these areas will reduce or eliminate all together the development of diabetes is stressed.   Updated lab needed at/ before next visit.   GERD (gastroesophageal reflux disease) Controlled, no change in medication   Dermatitis No infectious cause noted, zyrtec and topical steroid sparingly

## 2014-06-12 NOTE — Assessment & Plan Note (Signed)
No infectious cause noted, zyrtec and topical steroid sparingly

## 2014-07-02 ENCOUNTER — Ambulatory Visit (INDEPENDENT_AMBULATORY_CARE_PROVIDER_SITE_OTHER): Payer: Managed Care, Other (non HMO) | Admitting: Cardiovascular Disease

## 2014-07-02 ENCOUNTER — Encounter: Payer: Self-pay | Admitting: Cardiovascular Disease

## 2014-07-02 VITALS — BP 123/76 | HR 67 | Ht 64.0 in | Wt 140.5 lb

## 2014-07-02 DIAGNOSIS — R079 Chest pain, unspecified: Secondary | ICD-10-CM

## 2014-07-02 DIAGNOSIS — E785 Hyperlipidemia, unspecified: Secondary | ICD-10-CM

## 2014-07-02 DIAGNOSIS — R0609 Other forms of dyspnea: Secondary | ICD-10-CM

## 2014-07-02 DIAGNOSIS — I10 Essential (primary) hypertension: Secondary | ICD-10-CM

## 2014-07-02 MED ORDER — ALBUTEROL SULFATE HFA 108 (90 BASE) MCG/ACT IN AERS: 1.0000 | INHALATION_SPRAY | Freq: Four times a day (QID) | RESPIRATORY_TRACT | Status: DC | PRN

## 2014-07-02 NOTE — Progress Notes (Signed)
Patient ID: Tanya [name], female   DOB: 1951/07/09, 63 y.o.   MRN: [id number]      SUBJECTIVE: The patient presents for followup of chest pain and dyspnea with exertion for which she underwent cardiovascular testing. Echocardiogram demonstrated normal left ventricular systolic function and regional wall motion, EF 60-65%, mild LVH and grade 1 diastolic dysfunction with trivial valvular regurgitation. She also underwent an exercise Cardiolite stress test which was negative for ischemia or scar with excellent exercise tolerance, and was deemed a low risk study. She currently continues to complain of dyspnea after climbing 20 stairs at work. 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 She has a history of allergic rhinitis diagnosed in the [place]. She has occasional right-sided chest pain. She denies dysphagia. She points to her xiphoid process and feels like her shortness of breath originates from there. She denies orthopnea, PND and leg swelling.   Review of Systems: As per "subjective", otherwise negative.  No Known Allergies  Current Outpatient Prescriptions  Medication Sig Dispense Refill  . aspirin 81 MG tablet Take 81 mg by mouth daily.      . cetirizine (ZYRTEC) 10 MG tablet Take 1 tablet (10 mg total) by mouth daily.  30 tablet  4  . clobetasol cream (TEMOVATE) 2.35 % Apply 1 application topically 2 (two) times daily. Apply sparingly twice daily to rash for 2 days , then as needed  45 g  0  . fish oil-omega-3 fatty acids 1000 MG capsule Take 2 g by mouth daily.      Marland Kitchen omeprazole (PRILOSEC) 20 MG capsule TAKE ONE CAPSULE BY MOUTH ONCE DAILY  90 capsule  1  . pravastatin (PRAVACHOL) 80 MG tablet Take 1 tablet (80 mg total) by mouth daily.  30 tablet  5  . Triamcinolone Acetonide 0.025 % LOTN Apply opne drop to affected  ear twice daily for 5  Days, then as needed. Maximum use is twice per month  1 Bottle  0  . triamterene-hydrochlorothiazide (MAXZIDE-25) 37.5-25 MG per tablet Take 1 tablet  by mouth daily.  30 tablet  3  . vitamin C (ASCORBIC ACID) 500 MG tablet Take 500 mg by mouth daily.       No current facility-administered medications for this visit.    Past Medical History  Diagnosis Date  . Cystitis   . Anhedonia   . Unspecified hypothyroidism   . Hyperlipidemia   . Insomnia   . Hypertension   . Vaginitis   . GERD (gastroesophageal reflux disease)     No past surgical history on file.  History   Social History  . Marital Status: Married    Spouse Name: N/A    Number of Children: N/A  . Years of Education: N/A   Occupational History  . Not on file.   Social History Main Topics  . Smoking status: Never Smoker   . Smokeless tobacco: Never Used  . Alcohol Use: No  . Drug Use: No  . Sexual Activity: Not on file   Other Topics Concern  . Not on file   Social History Narrative   Pt originally from the [place] been in [place] for 20 years       Filed Vitals:   07/02/14 1600  BP: 123/76  Pulse: 67  Height: 5\' 4"  (1.626 m)  Weight: 140 lb 8 oz (63.73 kg)  SpO2: 97%    PHYSICAL EXAM General: NAD HEENT: Normal. Neck: No JVD, no thyromegaly. Lungs: Clear to auscultation bilaterally with normal respiratory  effort. CV: Nondisplaced PMI.  Regular rate and rhythm, normal S1/S2, no S3/S4, no murmur. No pretibial or periankle edema.  No carotid bruit.  Normal pedal pulses.  Abdomen: Soft, nontender, no hepatosplenomegaly, no distention.  Neurologic: Alert and oriented x 3.  Psych: Normal affect. Skin: Normal. Musculoskeletal: Normal range of motion, no gross deformities. Extremities: No clubbing or cyanosis.   ECG: Most recent ECG reviewed.      ASSESSMENT AND PLAN: 1. Chest pain and dyspnea with exertion: Cardiac testing was normal as noted above. 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 I will obtain PFT's and prescribe an albuterol inhaler to be used as needed. 2. Essential hypertension:  Well controlled on Maxzide 37.5/25 mg daily.  3. Hyperlipidemia: Currently on pravastatin 80 mg daily. Lipids on 07/17/2013 showed triglycerides 91, HDL 61, LDL 79.   Dispo: f/u prn.  [name], M.D., F.A.C.C.

## 2014-07-02 NOTE — Patient Instructions (Signed)
Your physician has recommended that you have a pulmonary function test. Pulmonary Function Tests are a group of tests that measure how well air moves in and out of your lungs. Office will contact with results via phone or letter.   Albuterol Inhaler - may use 1-2 puffs every 4-6 hours as needed for shortness of breath Continue all other medications.   Follow up as needed

## 2014-07-06 ENCOUNTER — Other Ambulatory Visit (HOSPITAL_COMMUNITY): Payer: Self-pay | Admitting: Family Medicine

## 2014-07-06 DIAGNOSIS — Z1231 Encounter for screening mammogram for malignant neoplasm of breast: Secondary | ICD-10-CM

## 2014-07-12 ENCOUNTER — Ambulatory Visit (HOSPITAL_COMMUNITY): Payer: Managed Care, Other (non HMO)

## 2014-07-22 ENCOUNTER — Telehealth: Payer: Self-pay | Admitting: Family Medicine

## 2014-07-22 ENCOUNTER — Other Ambulatory Visit: Payer: Self-pay | Admitting: Family Medicine

## 2014-07-22 NOTE — Telephone Encounter (Signed)
Already been sent

## 2014-07-23 ENCOUNTER — Ambulatory Visit (HOSPITAL_COMMUNITY)
Admission: RE | Admit: 2014-07-23 | Discharge: 2014-07-23 | Disposition: A | Discharge status: Home / Self Care | Payer: Managed Care, Other (non HMO) | Source: Ambulatory Visit | Attending: Cardiovascular Disease | Admitting: Cardiovascular Disease

## 2014-07-23 DIAGNOSIS — R0609 Other forms of dyspnea: Secondary | ICD-10-CM

## 2014-07-23 DIAGNOSIS — R0600 Dyspnea, unspecified: Secondary | ICD-10-CM | POA: Diagnosis not present

## 2014-07-23 LAB — PULMONARY FUNCTION TEST
DL/VA % pred: 84 %
DL/VA: 3.95 ml/min/mmHg/L
DLCO UNC % PRED: 54 %
DLCO cor % pred: 54 %
DLCO cor: 12.4 ml/min/mmHg
DLCO unc: 12.4 ml/min/mmHg
FEF 25-75 POST: 2.44 L/s
FEF 25-75 Pre: 1.68 L/sec
FEF2575-%CHANGE-POST: 44 %
FEF2575-%Pred-Post: 116 %
FEF2575-%Pred-Pre: 80 %
FEV1-%Change-Post: 9 %
FEV1-%PRED-POST: 92 %
FEV1-%Pred-Pre: 84 %
FEV1-POST: 2.17 L
FEV1-Pre: 1.97 L
FEV1FVC-%CHANGE-POST: 6 %
FEV1FVC-%Pred-Pre: 101 %
FEV6-%Change-Post: 3 %
FEV6-%Pred-Post: 89 %
FEV6-%Pred-Pre: 86 %
FEV6-Post: 2.62 L
FEV6-Pre: 2.53 L
FEV6FVC-%Pred-Post: 104 %
FEV6FVC-%Pred-Pre: 104 %
FVC-%Change-Post: 3 %
FVC-%Pred-Post: 85 %
FVC-%Pred-Pre: 82 %
FVC-Post: 2.62 L
FVC-Pre: 2.53 L
POST FEV1/FVC RATIO: 83 %
PRE FEV1/FVC RATIO: 78 %
Post FEV6/FVC ratio: 100 %
Pre FEV6/FVC Ratio: 100 %
RV % pred: 156 %
RV: 3.18 L
TLC % pred: 106 %
TLC: 5.21 L

## 2014-07-23 MED ORDER — ALBUTEROL SULFATE (2.5 MG/3ML) 0.083% IN NEBU
2.5000 mg | INHALATION_SOLUTION | Freq: Once | RESPIRATORY_TRACT | Status: AC
  Administered 2014-07-23: 2.5 mg via RESPIRATORY_TRACT

## 2014-08-01 LAB — HEMOGLOBIN A1C
HEMOGLOBIN A1C: 6.1 % — AB (ref <5.7)
Mean Plasma Glucose: 128 mg/dL — ABNORMAL HIGH (ref <117)

## 2014-08-01 LAB — COMPREHENSIVE METABOLIC PANEL
ALBUMIN: 4.2 g/dL (ref 3.5–5.2)
ALT: 24 U/L (ref 0–35)
AST: 28 U/L (ref 0–37)
Alkaline Phosphatase: 53 U/L (ref 39–117)
BUN: 16 mg/dL (ref 6–23)
CALCIUM: 9.4 mg/dL (ref 8.4–10.5)
CHLORIDE: 104 meq/L (ref 96–112)
CO2: 31 meq/L (ref 19–32)
CREATININE: 0.63 mg/dL (ref 0.50–1.10)
Glucose, Bld: 88 mg/dL (ref 70–99)
POTASSIUM: 3.4 meq/L — AB (ref 3.5–5.3)
Sodium: 140 mEq/L (ref 135–145)
Total Bilirubin: 0.4 mg/dL (ref 0.2–1.2)
Total Protein: 7.2 g/dL (ref 6.0–8.3)

## 2014-08-01 LAB — LIPID PANEL
CHOLESTEROL: 162 mg/dL (ref 0–200)
HDL: 55 mg/dL (ref >39)
LDL CALC: 83 mg/dL (ref 0–99)
Total CHOL/HDL Ratio: 2.9 Ratio
Triglycerides: 119 mg/dL (ref <150)
VLDL: 24 mg/dL (ref 0–40)

## 2014-08-01 LAB — TSH: TSH: 1.033 u[IU]/mL (ref 0.350–4.500)

## 2014-08-05 ENCOUNTER — Telehealth: Payer: Self-pay | Admitting: *Deleted

## 2014-08-05 DIAGNOSIS — J449 Chronic obstructive pulmonary disease, unspecified: Secondary | ICD-10-CM

## 2014-08-05 NOTE — Telephone Encounter (Signed)
-----   Message from Herminio Commons, MD sent at 07/23/2014  5:51 PM EDT ----- Appears related to a pulmonary vascular process. Would make a referral to pulmonary.

## 2014-08-05 NOTE — Telephone Encounter (Signed)
Notes Recorded by Laurine Blazer, LPN on 54/0/0867 at 61:95 AM Left message to return call.

## 2014-08-12 NOTE — Telephone Encounter (Signed)
Notes Recorded by Laurine Blazer, LPN on 57/89/7847 at 9:51 AM Left message to return call.

## 2014-08-16 NOTE — Telephone Encounter (Signed)
Notes Recorded by Laurine Blazer, LPN on 86/76/7209 at 3:16 PM Patient notified. Patient agrees to see Pulmonologist. Prefers to see MD close to home.

## 2014-08-22 ENCOUNTER — Other Ambulatory Visit: Payer: Self-pay | Admitting: Family Medicine

## 2014-08-24 ENCOUNTER — Other Ambulatory Visit: Payer: Self-pay

## 2014-08-24 MED ORDER — PRAVASTATIN SODIUM 80 MG PO TABS: ORAL_TABLET | ORAL | Status: DC

## 2014-08-30 ENCOUNTER — Other Ambulatory Visit: Payer: Self-pay

## 2014-08-30 MED ORDER — PRAVASTATIN SODIUM 80 MG PO TABS: ORAL_TABLET | ORAL | Status: DC

## 2014-09-02 ENCOUNTER — Encounter: Payer: Self-pay | Admitting: Family Medicine

## 2014-10-04 ENCOUNTER — Ambulatory Visit (HOSPITAL_COMMUNITY): Payer: Managed Care, Other (non HMO)

## 2014-10-04 ENCOUNTER — Ambulatory Visit (HOSPITAL_COMMUNITY)
Admission: RE | Admit: 2014-10-04 | Discharge: 2014-10-04 | Disposition: A | Discharge status: Home / Self Care | Payer: Managed Care, Other (non HMO) | Source: Ambulatory Visit | Attending: Family Medicine | Admitting: Family Medicine

## 2014-10-04 DIAGNOSIS — Z1231 Encounter for screening mammogram for malignant neoplasm of breast: Secondary | ICD-10-CM | POA: Insufficient documentation

## 2014-10-15 ENCOUNTER — Encounter: Payer: Managed Care, Other (non HMO) | Admitting: Family Medicine

## 2014-11-03 ENCOUNTER — Other Ambulatory Visit (HOSPITAL_COMMUNITY): Payer: Self-pay | Admitting: Pulmonary Disease

## 2014-11-03 DIAGNOSIS — R0602 Shortness of breath: Secondary | ICD-10-CM

## 2014-11-05 ENCOUNTER — Ambulatory Visit (HOSPITAL_COMMUNITY)
Admission: RE | Admit: 2014-11-05 | Discharge: 2014-11-05 | Disposition: A | Discharge status: Home / Self Care | Payer: Managed Care, Other (non HMO) | Source: Ambulatory Visit | Attending: Pulmonary Disease | Admitting: Pulmonary Disease

## 2014-11-05 DIAGNOSIS — R0602 Shortness of breath: Secondary | ICD-10-CM | POA: Diagnosis not present

## 2014-11-05 DIAGNOSIS — R079 Chest pain, unspecified: Secondary | ICD-10-CM | POA: Diagnosis not present

## 2014-11-05 LAB — POCT I-STAT CREATININE: Creatinine, Ser: 1 mg/dL (ref 0.50–1.10)

## 2014-11-05 MED ORDER — IOHEXOL 300 MG/ML  SOLN
80.0000 mL | Freq: Once | INTRAMUSCULAR | Status: AC | PRN
  Administered 2014-11-05: 80 mL via INTRAVENOUS

## 2014-11-08 ENCOUNTER — Ambulatory Visit (INDEPENDENT_AMBULATORY_CARE_PROVIDER_SITE_OTHER): Payer: Managed Care, Other (non HMO) | Admitting: Family Medicine

## 2014-11-08 ENCOUNTER — Encounter: Payer: Self-pay | Admitting: Family Medicine

## 2014-11-08 VITALS — BP 122/76 | HR 84 | Resp 18 | Ht 64.0 in | Wt 148.0 lb

## 2014-11-08 DIAGNOSIS — Z23 Encounter for immunization: Secondary | ICD-10-CM | POA: Insufficient documentation

## 2014-11-08 DIAGNOSIS — I10 Essential (primary) hypertension: Secondary | ICD-10-CM

## 2014-11-08 DIAGNOSIS — R0609 Other forms of dyspnea: Secondary | ICD-10-CM

## 2014-11-08 DIAGNOSIS — R938 Abnormal findings on diagnostic imaging of other specified body structures: Secondary | ICD-10-CM

## 2014-11-08 DIAGNOSIS — Z0000 Encounter for general adult medical examination without abnormal findings: Secondary | ICD-10-CM

## 2014-11-08 DIAGNOSIS — R9389 Abnormal findings on diagnostic imaging of other specified body structures: Secondary | ICD-10-CM

## 2014-11-08 DIAGNOSIS — E785 Hyperlipidemia, unspecified: Secondary | ICD-10-CM

## 2014-11-08 DIAGNOSIS — R7302 Impaired glucose tolerance (oral): Secondary | ICD-10-CM

## 2014-11-08 DIAGNOSIS — N3946 Mixed incontinence: Secondary | ICD-10-CM

## 2014-11-08 MED ORDER — SOLIFENACIN SUCCINATE 5 MG PO TABS: 5.0000 mg | ORAL_TABLET | Freq: Every day | ORAL | Status: DC

## 2014-11-08 NOTE — Patient Instructions (Addendum)
F/U IN 4 MONTH, CALL IF YOU NEED ME BEFORE  pREVANR TODAY  nEW MEDCIATION FOR URINARY SYMPTOMS IS [facility] ONE DAILY,, CALLIF YOU HAVE PROBLEMS  YOU ARE REFERRED TO CARDIOLOGY FOR RE EVALAUTION  KEEP FOLLOW UP WITH DR HAWKINS  FASTING CBC, LIPID, CMP, AND HBA1C THIS WEEK PLEASE  CUT BACK ON FOODS THAT MAKE YOU GASSY AND USE OTC 'BEANO' FOR SYMPTOM RELIEF

## 2014-11-08 NOTE — Assessment & Plan Note (Signed)

## 2014-11-08 NOTE — Progress Notes (Signed)
Subjective:    Patient ID: Tanya [name], female    DOB: 12/29/1950, 64 y.o.   MRN: [id number]  HPI Patient is in for annual physical exam. C/o urinary incontinence, wetting on herself when she coughs or laughs, denies burning or frequency, has had this problem for some time but is on no regular medication, states she wants help for this. C/o deformity of her fingers, wants to know if this is arthritis and if there is medication, I advised tylenol Wants to review recent chest scan result by pulmonary , I advised npo major lung abnormality seen, but since plaque noted in LAD I recommend she see cardiology, and will refer about this Prevnar administered as due     Review of Systems See HPI     Objective:   Physical Exam  BP 122/76 mmHg  Pulse 84  Resp 18  Ht 5\' 4"  (1.626 m)  Wt 148 lb (67.132 kg)  BMI 25.39 kg/m2  SpO2 97% Pleasant well nourished female, alert and oriented x 3, in no cardio-pulmonary distress. Afebrile. HEENT No facial trauma or asymetry. Sinuses non tender.  Extra occullar muscles intact, pupils equally reactive to light. External ears normal, tympanic membranes clear. Oropharynx moist, no exudate,fairly  good dentition. Neck: supple, no adenopathy,JVD or thyromegaly.No bruits.  Chest: Clear to ascultation bilaterally.No crackles or wheezes. Non tender to palpation  Breast: No asymetry,no masses or lumps. No tenderness. No nipple discharge or inversion. No axillary or supraclavicular adenopathy  Cardiovascular system; Heart sounds normal,  S1 and  S2 ,no S3.  No murmur, or thrill. Apical beat not displaced Peripheral pulses normal.  Abdomen: Soft, non tender, no organomegaly or masses. No bruits. Bowel sounds normal. No guarding, tenderness or rebound.  Rectal:  Normal sphincter tone. No mass.No rectal masses.  Guaiac negative stool.  GU: External genitalia normal female genitalia , female distribution of hair. No lesions. Urethral  meatus normal in size, no  Prolapse, no lesions visibly  Present. Bladder non tender. Vagina pink and moist , with no visible lesions , discharge present . Adequate pelvic support no  cystocele or rectocele noted Cervix pink and appears healthy, no lesions or ulcerations noted, no discharge noted from os Uterus normal size, no adnexal masses, no cervical motion or adnexal tenderness.   Musculoskeletal exam: Full ROM of spine, hips , shoulders and knees.  deformity ,of digits noted No muscle wasting or atrophy.   Neurologic: Cranial nerves 2 to 12 intact. Power, tone ,sensation and reflexes normal throughout. No disturbance in gait. No tremor.  Skin: Intact, no ulceration, erythema , scaling or rash noted. Pigmentation normal throughout  Psych; Normal mood and affect. Judgement and concentration normal       Assessment & Plan:  Annual physical exam Annual exam as documented. Counseling done  re healthy lifestyle involving commitment to 150 minutes exercise per week, heart healthy diet, and attaining healthy weight.The importance of adequate sleep also discussed. Regular seat belt use and home safety, is also discussed. Changes in health habits are decided on by the patient with goals and time frames  set for achieving them. Immunization and cancer screening needs are specifically addressed at this visit.    Need for vaccination with 13-polyvalent pneumococcal conjugate vaccine Vaccine administered at visit.    Urinary incontinence, mixed Symptomatic and worsening, trial of vesicare   Abnormal CT scan, chest Chest scan in 11/2014 shows moderate plaque in the LAD. Pt has IGT, hyperlipidemia and hypertension. Has c/o exertional fatigue, re  eval by cardiology indicated for management as deemed necessary

## 2014-11-08 NOTE — Assessment & Plan Note (Addendum)
Chest scan in 11/2014 shows moderate plaque in the LAD. Pt has IGT, hyperlipidemia and hypertension. Has c/o exertional fatigue, re eval by cardiology indicated for management as deemed necessary

## 2014-11-08 NOTE — Assessment & Plan Note (Signed)
Symptomatic and worsening, trial of vesicare

## 2014-11-08 NOTE — Assessment & Plan Note (Signed)
Vaccine administered at visit.  

## 2014-11-13 ENCOUNTER — Other Ambulatory Visit: Payer: Self-pay | Admitting: Family Medicine

## 2014-11-13 DIAGNOSIS — R945 Abnormal results of liver function studies: Principal | ICD-10-CM

## 2014-11-13 DIAGNOSIS — R7989 Other specified abnormal findings of blood chemistry: Secondary | ICD-10-CM

## 2014-11-14 LAB — LIPID PANEL
CHOL/HDL RATIO: 2.8 ratio
CHOLESTEROL: 185 mg/dL (ref 0–200)
HDL: 66 mg/dL (ref >39)
LDL Cholesterol: 95 mg/dL (ref 0–99)
TRIGLYCERIDES: 118 mg/dL (ref <150)
VLDL: 24 mg/dL (ref 0–40)

## 2014-11-14 LAB — CBC
HCT: 40.4 % (ref 36.0–46.0)
Hemoglobin: 13.8 g/dL (ref 12.0–15.0)
MCH: 31.7 pg (ref 26.0–34.0)
MCHC: 34.2 g/dL (ref 30.0–36.0)
MCV: 92.9 fL (ref 78.0–100.0)
MPV: 8.3 fL — AB (ref 8.6–12.4)
PLATELETS: 401 10*3/uL — AB (ref 150–400)
RBC: 4.35 MIL/uL (ref 3.87–5.11)
RDW: 13.7 % (ref 11.5–15.5)
WBC: 5.2 10*3/uL (ref 4.0–10.5)

## 2014-11-14 LAB — COMPREHENSIVE METABOLIC PANEL
ALT: 65 U/L — ABNORMAL HIGH (ref 0–35)
AST: 42 U/L — AB (ref 0–37)
Albumin: 4.3 g/dL (ref 3.5–5.2)
Alkaline Phosphatase: 63 U/L (ref 39–117)
BILIRUBIN TOTAL: 0.5 mg/dL (ref 0.2–1.2)
BUN: 14 mg/dL (ref 6–23)
CALCIUM: 9.9 mg/dL (ref 8.4–10.5)
CO2: 31 meq/L (ref 19–32)
Chloride: 100 mEq/L (ref 96–112)
Creat: 0.69 mg/dL (ref 0.50–1.10)
Glucose, Bld: 96 mg/dL (ref 70–99)
Potassium: 3.7 mEq/L (ref 3.5–5.3)
Sodium: 139 mEq/L (ref 135–145)
Total Protein: 7.7 g/dL (ref 6.0–8.3)

## 2014-11-14 LAB — HEMOGLOBIN A1C
HEMOGLOBIN A1C: 6 % — AB (ref <5.7)
MEAN PLASMA GLUCOSE: 126 mg/dL — AB (ref <117)

## 2014-11-15 ENCOUNTER — Encounter: Payer: Self-pay | Admitting: Cardiovascular Disease

## 2014-11-15 ENCOUNTER — Other Ambulatory Visit: Payer: Self-pay | Admitting: Cardiovascular Disease

## 2014-11-15 ENCOUNTER — Ambulatory Visit: Payer: Managed Care, Other (non HMO) | Admitting: Cardiovascular Disease

## 2014-11-15 ENCOUNTER — Ambulatory Visit (INDEPENDENT_AMBULATORY_CARE_PROVIDER_SITE_OTHER): Payer: Managed Care, Other (non HMO) | Admitting: Cardiovascular Disease

## 2014-11-15 VITALS — BP 146/78 | HR 71 | Ht 63.0 in | Wt 149.4 lb

## 2014-11-15 DIAGNOSIS — I10 Essential (primary) hypertension: Secondary | ICD-10-CM

## 2014-11-15 DIAGNOSIS — R079 Chest pain, unspecified: Secondary | ICD-10-CM

## 2014-11-15 DIAGNOSIS — I2510 Atherosclerotic heart disease of native coronary artery without angina pectoris: Secondary | ICD-10-CM

## 2014-11-15 DIAGNOSIS — R0609 Other forms of dyspnea: Secondary | ICD-10-CM

## 2014-11-15 DIAGNOSIS — E785 Hyperlipidemia, unspecified: Secondary | ICD-10-CM

## 2014-11-15 LAB — HEPATITIS PANEL, ACUTE
HCV Ab: NEGATIVE
HEP B S AG: NEGATIVE
Hep A IgM: NONREACTIVE
Hep B C IgM: NONREACTIVE

## 2014-11-15 NOTE — Progress Notes (Signed)
Patient ID: Tanya [name], female   DOB: 08/24/1951, 64 y.o.   MRN: [id number]      SUBJECTIVE: The patient presents for evaluation of abnormal chest CT findings. Chest CT on 11/05/14 showed moderate calcified plaque in the region of the LAD. I previously evaluated her for chest pain and dyspnea with exertion for which she underwent cardiovascular testing. Echocardiogram in 5/15 demonstrated normal left ventricular systolic function and regional wall motion, EF 60-65%, mild LVH and grade 1 diastolic dysfunction with trivial valvular regurgitation. She also underwent an exercise Cardiolite stress test in 5/15 which was negative for ischemia or scar with excellent exercise tolerance, and was deemed a low risk study. However, her exertional dyspnea has gotten worse and she is having more frequent left precordial chest pains and right sided chest pains occurring both with and without exertion. She reportedly saw a pulmonologist who did not have any further recommendations. I previously prescribed albuterol which she did not take as the pharmacist told her she may have "shakes" with it.     Review of Systems: As per "subjective", otherwise negative.  No Known Allergies  Current Outpatient Prescriptions  Medication Sig Dispense Refill  . aspirin 81 MG tablet Take 81 mg by mouth daily.    . cetirizine (ZYRTEC) 10 MG tablet Take 1 tablet (10 mg total) by mouth daily. 30 tablet 4  . clobetasol cream (TEMOVATE) 2.99 % Apply 1 application topically 2 (two) times daily. Apply sparingly twice daily to rash for 2 days , then as needed 45 g 0  . fish oil-omega-3 fatty acids 1000 MG capsule Take 2 g by mouth daily.    Marland Kitchen omeprazole (PRILOSEC) 20 MG capsule TAKE ONE CAPSULE BY MOUTH ONCE DAILY 90 capsule 1  . pravastatin (PRAVACHOL) 80 MG tablet TAKE ONE TABLET BY MOUTH ONCE DAILY 30 tablet 4  . solifenacin (VESICARE) 5 MG tablet Take 1 tablet (5 mg total) by mouth daily. 30 tablet 4  .  triamterene-hydrochlorothiazide (MAXZIDE-25) 37.5-25 MG per tablet Take 1 tablet by mouth daily. 30 tablet 3  . vitamin C (ASCORBIC ACID) 500 MG tablet Take 500 mg by mouth daily.    Marland Kitchen albuterol (PROVENTIL HFA;VENTOLIN HFA) 108 (90 BASE) MCG/ACT inhaler Inhale 1-2 puffs into the lungs every 6 (six) hours as needed for wheezing or shortness of breath. (Patient not taking: Reported on 11/15/2014) 1 Inhaler 2   No current facility-administered medications for this visit.    Past Medical History  Diagnosis Date  . Cystitis   . Anhedonia   . Unspecified hypothyroidism   . Hyperlipidemia   . Insomnia   . Hypertension   . Vaginitis   . GERD (gastroesophageal reflux disease)     No past surgical history on file.  History   Social History  . Marital Status: Married    Spouse Name: N/A  . Number of Children: N/A  . Years of Education: N/A   Occupational History  . Not on file.   Social History Main Topics  . Smoking status: Never Smoker   . Smokeless tobacco: Never Used  . Alcohol Use: No  . Drug Use: No  . Sexual Activity: Not on file   Other Topics Concern  . Not on file   Social History Narrative   Pt originally from the [place] been in [place] for 20 years       Filed Vitals:   11/15/14 1429  BP: 146/78  Pulse: 71  Height: 5\' 3"  (1.6 m)  Weight:  149 lb 6.4 oz (67.767 kg)  SpO2: 95%    PHYSICAL EXAM General: NAD HEENT: Normal. Neck: No JVD, no thyromegaly. Lungs: Clear to auscultation bilaterally with normal respiratory effort. CV: Nondisplaced PMI.  Regular rate and rhythm, normal S1/S2, no S3/S4, no murmur. No pretibial or periankle edema.  No carotid bruit.  Normal pedal pulses.  Abdomen: Soft, nontender, no hepatosplenomegaly, no distention.  Neurologic: Alert and oriented x 3.  Psych: Normal affect. Skin: Normal. Musculoskeletal: Normal range of motion, no gross deformities. Extremities: No clubbing or cyanosis.   ECG: Most recent ECG  reviewed.      ASSESSMENT AND PLAN: 1. Abnormal chest CT with "moderate LAD calcification", chest pain, and dyspnea with exertion: Cardiac testing in 2015 was normal as noted above. However, given the progression of her symptoms, I will arrange for coronary angiography to definitively evaluate for ischemic heart disease as the underlying etiology. She is in agreement with this plan. Continue ASA and statin. 2. Essential hypertension: Mildly elevated on Maxzide 37.5/25 mg daily. Will monitor for now. 3. Hyperlipidemia: Currently on pravastatin 80 mg daily. Lipids on 07/15/2014 were normal.   Dispo: f/u after cath.   [name], M.D., F.A.C.C.

## 2014-11-16 ENCOUNTER — Telehealth: Payer: Self-pay | Admitting: Cardiovascular Disease

## 2014-11-16 ENCOUNTER — Encounter: Payer: Self-pay | Admitting: *Deleted

## 2014-11-16 DIAGNOSIS — I2510 Atherosclerotic heart disease of native coronary artery without angina pectoris: Secondary | ICD-10-CM | POA: Diagnosis present

## 2014-11-16 NOTE — Patient Instructions (Signed)
Your physician has requested that you have a cardiac catheterization. Cardiac catheterization is used to diagnose and/or treat various heart conditions. Doctors may recommend this procedure for a number of different reasons. The most common reason is to evaluate chest pain. Chest pain can be a symptom of coronary artery disease (CAD), and cardiac catheterization can show whether plaque is narrowing or blocking your heart's arteries. This procedure is also used to evaluate the valves, as well as measure the blood flow and oxygen levels in different parts of your heart. For further information please visit www.cardiosmart.org. Please follow instruction sheet, as given. Continue all current medications. Follow up will be given at time of discharge from procedure above.   

## 2014-11-16 NOTE — Telephone Encounter (Signed)
Checking percert for Cath Scheduled for this Friday at 9:00 with Dr. Ellyn Hack.

## 2014-11-17 NOTE — Telephone Encounter (Signed)
EVICORE YELY#[id number] Expires 01-15-15

## 2014-11-19 ENCOUNTER — Ambulatory Visit (HOSPITAL_COMMUNITY)
Admission: RE | Admit: 2014-11-19 | Discharge: 2014-11-19 | Disposition: A | Discharge status: Home / Self Care | Payer: Managed Care, Other (non HMO) | Source: Ambulatory Visit | Attending: Cardiology | Admitting: Cardiology

## 2014-11-19 ENCOUNTER — Encounter (HOSPITAL_COMMUNITY): Payer: Self-pay | Admitting: Cardiology

## 2014-11-19 ENCOUNTER — Encounter (HOSPITAL_COMMUNITY)
Admission: RE | Disposition: A | Discharge status: Home / Self Care | Payer: Self-pay | Source: Ambulatory Visit | Attending: Cardiology

## 2014-11-19 DIAGNOSIS — Z7982 Long term (current) use of aspirin: Secondary | ICD-10-CM | POA: Insufficient documentation

## 2014-11-19 DIAGNOSIS — I200 Unstable angina: Secondary | ICD-10-CM | POA: Diagnosis present

## 2014-11-19 DIAGNOSIS — Z79899 Other long term (current) drug therapy: Secondary | ICD-10-CM | POA: Diagnosis not present

## 2014-11-19 DIAGNOSIS — R0609 Other forms of dyspnea: Secondary | ICD-10-CM | POA: Diagnosis present

## 2014-11-19 DIAGNOSIS — R079 Chest pain, unspecified: Secondary | ICD-10-CM | POA: Diagnosis present

## 2014-11-19 DIAGNOSIS — E039 Hypothyroidism, unspecified: Secondary | ICD-10-CM | POA: Diagnosis not present

## 2014-11-19 DIAGNOSIS — G4700 Insomnia, unspecified: Secondary | ICD-10-CM | POA: Diagnosis not present

## 2014-11-19 DIAGNOSIS — I2510 Atherosclerotic heart disease of native coronary artery without angina pectoris: Secondary | ICD-10-CM | POA: Diagnosis present

## 2014-11-19 DIAGNOSIS — I25119 Atherosclerotic heart disease of native coronary artery with unspecified angina pectoris: Secondary | ICD-10-CM

## 2014-11-19 DIAGNOSIS — E785 Hyperlipidemia, unspecified: Secondary | ICD-10-CM | POA: Diagnosis present

## 2014-11-19 DIAGNOSIS — I351 Nonrheumatic aortic (valve) insufficiency: Secondary | ICD-10-CM | POA: Insufficient documentation

## 2014-11-19 DIAGNOSIS — I10 Essential (primary) hypertension: Secondary | ICD-10-CM | POA: Insufficient documentation

## 2014-11-19 DIAGNOSIS — K219 Gastro-esophageal reflux disease without esophagitis: Secondary | ICD-10-CM | POA: Diagnosis not present

## 2014-11-19 HISTORY — PX: LEFT HEART CATHETERIZATION WITH CORONARY ANGIOGRAM: SHX5451

## 2014-11-19 LAB — PROTIME-INR
INR: 1.02 (ref 0.00–1.49)
Prothrombin Time: 13.5 seconds (ref 11.6–15.2)

## 2014-11-19 SURGERY — LEFT HEART CATHETERIZATION WITH CORONARY ANGIOGRAM

## 2014-11-19 MED ORDER — ASPIRIN 81 MG PO CHEW
81.0000 mg | CHEWABLE_TABLET | ORAL | Status: AC
  Administered 2014-11-19: 81 mg via ORAL

## 2014-11-19 MED ORDER — LIDOCAINE HCL (PF) 1 % IJ SOLN
INTRAMUSCULAR | Status: AC
  Filled 2014-11-19: qty 30

## 2014-11-19 MED ORDER — VERAPAMIL HCL 2.5 MG/ML IV SOLN
INTRAVENOUS | Status: AC
  Filled 2014-11-19: qty 2

## 2014-11-19 MED ORDER — SODIUM CHLORIDE 0.9 % IV SOLN: 250.0000 mL | INTRAVENOUS | Status: DC | PRN

## 2014-11-19 MED ORDER — ASPIRIN 81 MG PO CHEW
CHEWABLE_TABLET | ORAL | Status: AC
  Filled 2014-11-19: qty 1

## 2014-11-19 MED ORDER — HEPARIN SODIUM (PORCINE) 1000 UNIT/ML IJ SOLN
INTRAMUSCULAR | Status: AC
  Filled 2014-11-19: qty 1

## 2014-11-19 MED ORDER — NITROGLYCERIN 1 MG/10 ML FOR IR/CATH LAB
INTRA_ARTERIAL | Status: AC
  Filled 2014-11-19: qty 10

## 2014-11-19 MED ORDER — HEPARIN (PORCINE) IN NACL 2-0.9 UNIT/ML-% IJ SOLN
INTRAMUSCULAR | Status: AC
Start: 2014-11-19 — End: 2014-11-19
  Filled 2014-11-19: qty 1500

## 2014-11-19 MED ORDER — SODIUM CHLORIDE 0.9 % IV SOLN
INTRAVENOUS | Status: DC
Start: 2014-11-19 — End: 2014-11-19

## 2014-11-19 MED ORDER — SODIUM CHLORIDE 0.9 % IJ SOLN: 3.0000 mL | Freq: Two times a day (BID) | INTRAMUSCULAR | Status: DC

## 2014-11-19 MED ORDER — FENTANYL CITRATE 0.05 MG/ML IJ SOLN
INTRAMUSCULAR | Status: AC
  Filled 2014-11-19: qty 2

## 2014-11-19 MED ORDER — MIDAZOLAM HCL 2 MG/2ML IJ SOLN
INTRAMUSCULAR | Status: AC
  Filled 2014-11-19: qty 2

## 2014-11-19 MED ORDER — SODIUM CHLORIDE 0.9 % IJ SOLN: 3.0000 mL | INTRAMUSCULAR | Status: DC | PRN

## 2014-11-19 NOTE — Interval H&P Note (Signed)
History and Physical Interval Note:  11/19/2014 7:50 AM  Tanya [name]  has presented today for surgery, with the diagnosis of cp (concerning for accelerating angina /shortness of breath. Non-invasive testing was Negative, but symptoms continue as incongruent with findings.  Therefore, she is referred for definitive invasive evaluation.    The various methods of treatment have been discussed with the patient and family. After consideration of risks, benefits and other options for treatment, the patient has consented to  Procedure(s): LEFT HEART CATHETERIZATION WITH CORONARY ANGIOGRAM (N/A) as a surgical intervention .  The patient's history has been reviewed, patient examined, no change in status, stable for surgery.  I have reviewed the patient's chart and labs.  Questions were answered to the patient's satisfaction.     HARDING, DAVID W

## 2014-11-19 NOTE — CV Procedure (Signed)
CARDIAC CATHETERIZATION REPORT  NAME:  [name]   MRN: [id number] DOB:  Jul 29, 1951   ADMIT DATE: 11/19/2014 Procedure Date: 11/19/2014  INTERVENTIONAL CARDIOLOGIST: [name], M.D., MS PRIMARY CARE PROVIDER: Tula Nakayama, MD PRIMARY CARDIOLOGIST: Dr. Bronson Ing  PATIENT:  Tanya [name] is a 64 y.o. female seen by Dr. Bronson Ing for exertional dyspnea and chest pain. She does have CT documentation of moderate calcified plaque in the LAD. She has had a negative cardiac stress test for ischemia along with a relatively normal echocardiogram. Based on persistence and progressively worsening symptoms, she is referred for invasive cardiac catheterization for clarification.  PRE-OPERATIVE DIAGNOSIS:    Progressive Exertional Dyspnea & Chest Pain concerning for Exertional Angina  PROCEDURES PERFORMED:    Left Heart Catheterization with Native Coronary Angiography  via Right Artery   Left Ventriculography  PROCEDURE: The patient was brought to the [facility]nd [facility] in the fasting state and prepped and draped in the usual sterile fashion for Right Radial artery access. A modified Allen's test was performed on the Right wrist demonstrating excellent collateral flow for radial access.   Sterile technique was used including antiseptics, cap, gloves, gown, hand hygiene, mask and sheet. Skin prep: Chlorhexidine.   Consent: Risks of procedure as well as the alternatives and risks of each were explained to the (patient/caregiver). Consent for procedure obtained.   Time Out: Verified patient identification, verified procedure, site/side was marked, verified correct patient position, special equipment/implants available, medications/allergies/relevent history reviewed, required imaging and test results available. Performed.  Access:   Right radial  Artery: 5 Fr Sheath -  Seldinger Technique (Angiocath Micropuncture Kit)  Radial Cocktail - 10 mL; IV  Heparin 4000 Units   Left Heart Catheterization: 5Fr Catheters advanced over a Versicore wire and exchanged over a long exchange safety J-wire - under fluoroscopic guidance; TIG 4.0 catheter advanced first.  Left & Right Coronary Artery Cineangiography: TIG 4.0 Catheter   LV Hemodynamics (LV Gram): Angled pigtail Catheter   Sheath removed in the cardiac apposition lab with TR band and manual pressure for hemostasis.  TR Band: 10:15  Hours; 12 mL air  FINDINGS:  Hemodynamics:   Central Aortic Pressure / Mean: 118/60/85 mmHg  Left Ventricular Pressure / LVEDP: 139/6/19 mmHg  Aortic valve gradient: Mean 19.6 mmHg, peak-peak 21 mmHg.  Left Ventriculography:  EF: 65 %  Wall Motion: Hyperdynamic; trivial-mild AI  Coronary Anatomy:  Dominance: Right  Left Main: Normal/large caliber vessel that bifurcates into the LAD and Circumflex. Angiographically normal. LAD: Large-caliber vessel that reached down around the apex perfusing the distal inferoapex. There is a smooth focal 50-60% stenosis in the mid vessel just prior to the takeoff of a large major diagonal branch. Otherwise no significant disease.  D1: Moderate large-caliber - major diagonal branch coming from the mid LAD. Angiographically normal.  Left Circumflex: Large-caliber, nondominant vessel with 2 OM branches and a posterolateral branch. Mild luminal irregularities noted.  OM1: Moderate-large caliber branch with minimal luminal irregularities.  OM 2: Moderate caliber vessel that is 1 branch of the bifurcation between the AV groove circumflex and this branch. Angiographically normal.  The AV groove circumflex continues distally to terminate as a moderate caliber posterolateral branch.    RCA: Large-caliber, dominant vessel with a major bifurcating RV marginal branch in the mid vessel. The vessel bifurcates distally into the Posterior AV Groove Branch (RPAV) and the Right Posterior Descending Artery (RPDA). Minimal luminal  irregularities   RPDA: Moderate caliber vessel  reaching two thirds the way to the apex. Angiographically normal.  RPL Sysytem:The RPAV Moderate caliber vessel that terminates as a major posterolateral branch with several small branches distally. Minimal luminal irregularities.  MEDICATIONS:   Anesthesia:  Local Lidocaine 2 ml  Sedation: 2 mgVersed,75mg IV fentanyl ;   Omnipaque Contrast:  60 ml  Anticoagulation:  IV Heparin 4000 Units  Radial Cocktail: 5 mg Verapamil, 400 mcg NTG, 2 ml 2% Lidocaine in 10 ml NS  PATIENT DISPOSITION:    The patient was transferred to the PACU holding area in a hemodynamicaly stable, chest pain free condition.  The patient tolerated the procedure well, and there were no complications.  EBL:   < 10 ml  The patient was stable before, during, and after the procedure.  POST-OPERATIVE DIAGNOSIS:    Angiographically only moderate LAD disease with a smooth lesion. Unlikely to be flow-limiting and the culprit for her symptoms. Would have expected this to show up on a stress test.  the angiographic finding confirms presence of moderate disease in the LAD that does not appear to be flow-limiting.   Pressure tracings would suggest possible mild aortic stenosis not seen on echocardiogram. There is also trivial-mild aortic insufficiency  Unexpectedly elevated LVEDP of 19 mmHg suggesting diastolic dysfunction  PLAN OF CARE:  Standard post radial cath care.  Discharged this afternoon   ROV with Dr. KBronson Ing--> risk factor modification and medical management for possible microvascular ischemia and diastolic dysfunction  Would consider rechecking echocardiogram to assess aortic valve based on pressure tracings.   HLeonie Man M.D., M.S. Interventional Cardiologist   Pager # [phone]

## 2014-11-19 NOTE — Discharge Instructions (Signed)
Radial Site Care °Refer to this sheet in the next few weeks. These instructions provide you with information on caring for yourself after your procedure. Your caregiver may also give you more specific instructions. Your treatment has been planned according to current medical practices, but problems sometimes occur. Call your caregiver if you have any problems or questions after your procedure. °HOME CARE INSTRUCTIONS °· You may shower the day after the procedure. Remove the bandage (dressing) and gently wash the site with plain soap and water. Gently pat the site dry. °· Do not apply powder or lotion to the site. °· Do not submerge the affected site in water for 3 to 5 days. °· Inspect the site at least twice daily. °· Do not flex or bend the affected arm for 24 hours. °· No lifting over 5 pounds (2.3 kg) for 5 days after your procedure. °· Do not drive home if you are discharged the same day of the procedure. Have someone else drive you. °· You may drive 24 hours after the procedure unless otherwise instructed by your caregiver. °· Do not operate machinery or power tools for 24 hours. °· A responsible adult should be with you for the first 24 hours after you arrive home. °What to expect: °· Any bruising will usually fade within 1 to 2 weeks. °· Blood that collects in the tissue (hematoma) may be painful to the touch. It should usually decrease in size and tenderness within 1 to 2 weeks. °SEEK IMMEDIATE MEDICAL CARE IF: °· You have unusual pain at the radial site. °· You have redness, warmth, swelling, or pain at the radial site. °· You have drainage (other than a small amount of blood on the dressing). °· You have chills. °· You have a fever or persistent symptoms for more than 72 hours. °· You have a fever and your symptoms suddenly get worse. °· Your arm becomes pale, cool, tingly, or numb. °· You have heavy bleeding from the site. Hold pressure on the site. °Document Released: 10/20/2010 Document Revised:  12/10/2011 Document Reviewed: 10/20/2010 °ExitCare® Patient Information ©2015 [employer]. This information is not intended to replace advice given to you by your health care provider. Make sure you discuss any questions you have with your health care provider. ° °

## 2014-11-19 NOTE — H&P (View-Only) (Signed)
Patient ID: Tanya [name], female   DOB: [birth year], 64 y.o.   MRN: [id number]      SUBJECTIVE: The patient presents for evaluation of abnormal chest CT findings. Chest CT on 11/05/14 showed moderate calcified plaque in the region of the LAD. I previously evaluated her for chest pain and dyspnea with exertion for which she underwent cardiovascular testing. Echocardiogram in 5/15 demonstrated normal left ventricular systolic function and regional wall motion, EF 60-65%, mild LVH and grade 1 diastolic dysfunction with trivial valvular regurgitation. She also underwent an exercise Cardiolite stress test in 5/15 which was negative for ischemia or scar with excellent exercise tolerance, and was deemed a low risk study. However, her exertional dyspnea has gotten worse and she is having more frequent left precordial chest pains and right sided chest pains occurring both with and without exertion. She reportedly saw a pulmonologist who did not have any further recommendations. I previously prescribed albuterol which she did not take as the pharmacist told her she may have "shakes" with it.     Review of Systems: As per "subjective", otherwise negative.  No Known Allergies  Current Outpatient Prescriptions  Medication Sig Dispense Refill  . aspirin 81 MG tablet Take 81 mg by mouth daily.    . cetirizine (ZYRTEC) 10 MG tablet Take 1 tablet (10 mg total) by mouth daily. 30 tablet 4  . clobetasol cream (TEMOVATE) 3.81 % Apply 1 application topically 2 (two) times daily. Apply sparingly twice daily to rash for 2 days , then as needed 45 g 0  . fish oil-omega-3 fatty acids 1000 MG capsule Take 2 g by mouth daily.    Marland Kitchen omeprazole (PRILOSEC) 20 MG capsule TAKE ONE CAPSULE BY MOUTH ONCE DAILY 90 capsule 1  . pravastatin (PRAVACHOL) 80 MG tablet TAKE ONE TABLET BY MOUTH ONCE DAILY 30 tablet 4  . solifenacin (VESICARE) 5 MG tablet Take 1 tablet (5 mg total) by mouth daily. 30 tablet 4  .  triamterene-hydrochlorothiazide (MAXZIDE-25) 37.5-25 MG per tablet Take 1 tablet by mouth daily. 30 tablet 3  . vitamin C (ASCORBIC ACID) 500 MG tablet Take 500 mg by mouth daily.    Marland Kitchen albuterol (PROVENTIL HFA;VENTOLIN HFA) 108 (90 BASE) MCG/ACT inhaler Inhale 1-2 puffs into the lungs every 6 (six) hours as needed for wheezing or shortness of breath. (Patient not taking: Reported on 11/15/2014) 1 Inhaler 2   No current facility-administered medications for this visit.    Past Medical History  Diagnosis Date  . Cystitis   . Anhedonia   . Unspecified hypothyroidism   . Hyperlipidemia   . Insomnia   . Hypertension   . Vaginitis   . GERD (gastroesophageal reflux disease)     No past surgical history on file.  History   Social History  . Marital Status: Married    Spouse Name: N/A  . Number of Children: N/A  . Years of Education: N/A   Occupational History  . Not on file.   Social History Main Topics  . Smoking status: Never Smoker   . Smokeless tobacco: Never Used  . Alcohol Use: No  . Drug Use: No  . Sexual Activity: Not on file   Other Topics Concern  . Not on file   Social History Narrative   Pt originally from the [place] been in [place] for 20 years       Filed Vitals:   11/15/14 1429  BP: 146/78  Pulse: 71  Height: 5\' 3"  (1.6 m)  Weight:  149 lb 6.4 oz (67.767 kg)  SpO2: 95%    PHYSICAL EXAM General: NAD HEENT: Normal. Neck: No JVD, no thyromegaly. Lungs: Clear to auscultation bilaterally with normal respiratory effort. CV: Nondisplaced PMI.  Regular rate and rhythm, normal S1/S2, no S3/S4, no murmur. No pretibial or periankle edema.  No carotid bruit.  Normal pedal pulses.  Abdomen: Soft, nontender, no hepatosplenomegaly, no distention.  Neurologic: Alert and oriented x 3.  Psych: Normal affect. Skin: Normal. Musculoskeletal: Normal range of motion, no gross deformities. Extremities: No clubbing or cyanosis.   ECG: Most recent ECG  reviewed.      ASSESSMENT AND PLAN: 1. Abnormal chest CT with "moderate LAD calcification", chest pain, and dyspnea with exertion: Cardiac testing in 2015 was normal as noted above. However, given the progression of her symptoms, I will arrange for coronary angiography to definitively evaluate for ischemic heart disease as the underlying etiology. She is in agreement with this plan. Continue ASA and statin. 2. Essential hypertension: Mildly elevated on Maxzide 37.5/25 mg daily. Will monitor for now. 3. Hyperlipidemia: Currently on pravastatin 80 mg daily. Lipids on 07/15/2014 were normal.   Dispo: f/u after cath.   [name], M.D., F.A.C.C.

## 2014-11-22 ENCOUNTER — Ambulatory Visit (INDEPENDENT_AMBULATORY_CARE_PROVIDER_SITE_OTHER): Payer: Managed Care, Other (non HMO) | Admitting: Cardiovascular Disease

## 2014-11-22 ENCOUNTER — Encounter: Payer: Self-pay | Admitting: Cardiovascular Disease

## 2014-11-22 ENCOUNTER — Other Ambulatory Visit: Payer: Self-pay | Admitting: Family Medicine

## 2014-11-22 VITALS — BP 142/86 | HR 73 | Ht 63.0 in | Wt 146.0 lb

## 2014-11-22 DIAGNOSIS — I25118 Atherosclerotic heart disease of native coronary artery with other forms of angina pectoris: Secondary | ICD-10-CM

## 2014-11-22 DIAGNOSIS — I359 Nonrheumatic aortic valve disorder, unspecified: Secondary | ICD-10-CM

## 2014-11-22 DIAGNOSIS — I10 Essential (primary) hypertension: Secondary | ICD-10-CM

## 2014-11-22 DIAGNOSIS — Z136 Encounter for screening for cardiovascular disorders: Secondary | ICD-10-CM

## 2014-11-22 DIAGNOSIS — E785 Hyperlipidemia, unspecified: Secondary | ICD-10-CM

## 2014-11-22 MED ORDER — ISOSORBIDE MONONITRATE ER 30 MG PO TB24: 30.0000 mg | ORAL_TABLET | Freq: Every day | ORAL | Status: DC

## 2014-11-22 NOTE — Progress Notes (Signed)
Patient ID: Tanya [name], female   DOB: [birth year], 64 y.o.   MRN: [id number]      SUBJECTIVE: The patient returns for follow-up after undergoing coronary angiography. This demonstrated a mid LAD 50-60% stenosis which was not flow limiting. The remaining epicardial coronary vessels were either angiographically normal or had mild luminal irregularities. LVEDP was elevated at 19 mmHg. There was some suggestion of mild aortic stenosis by pressure tracings, not discerned by echocardiography in May 2015. ECG performed on 11/19/14 demonstrated normal sinus rhythm with no ischemic ST segment or T-wave abnormalities and no arrhythmias. She occasionally has palpitations. She has several questions about her procedure.  Review of Systems: As per "subjective", otherwise negative.  No Known Allergies  Current Outpatient Prescriptions  Medication Sig Dispense Refill  . albuterol (PROVENTIL HFA;VENTOLIN HFA) 108 (90 BASE) MCG/ACT inhaler Inhale 1-2 puffs into the lungs every 6 (six) hours as needed for wheezing or shortness of breath. 1 Inhaler 2  . aspirin 81 MG tablet Take 81 mg by mouth daily.    . cetirizine (ZYRTEC) 10 MG tablet Take 1 tablet (10 mg total) by mouth daily. 30 tablet 4  . clobetasol cream (TEMOVATE) 1.22 % Apply 1 application topically 2 (two) times daily. Apply sparingly twice daily to rash for 2 days , then as needed 45 g 0  . fish oil-omega-3 fatty acids 1000 MG capsule Take 2 g by mouth daily.    Marland Kitchen omeprazole (PRILOSEC) 20 MG capsule TAKE ONE CAPSULE BY MOUTH ONCE DAILY 90 capsule 1  . pravastatin (PRAVACHOL) 80 MG tablet TAKE ONE TABLET BY MOUTH ONCE DAILY 30 tablet 4  . solifenacin (VESICARE) 5 MG tablet Take 1 tablet (5 mg total) by mouth daily. 30 tablet 4  . triamterene-hydrochlorothiazide (MAXZIDE-25) 37.5-25 MG per tablet TAKE ONE TABLET BY MOUTH ONCE DAILY 30 tablet 4  . vitamin C (ASCORBIC ACID) 500 MG tablet Take 500 mg by mouth daily.     No current  facility-administered medications for this visit.    Past Medical History  Diagnosis Date  . Cystitis   . Anhedonia   . Unspecified hypothyroidism   . Hyperlipidemia   . Insomnia   . Hypertension   . Vaginitis   . GERD (gastroesophageal reflux disease)     Past Surgical History  Procedure Laterality Date  . Left heart catheterization with coronary angiogram N/A 11/19/2014    Procedure: LEFT HEART CATHETERIZATION WITH CORONARY ANGIOGRAM;  Surgeon: [name], MD;  Location: [facility] CATH LAB;  Service: Cardiovascular;  Laterality: N/A;    History   Social History  . Marital Status: Married    Spouse Name: N/A  . Number of Children: N/A  . Years of Education: N/A   Occupational History  . Not on file.   Social History Main Topics  . Smoking status: Never Smoker   . Smokeless tobacco: Never Used  . Alcohol Use: No  . Drug Use: No  . Sexual Activity: Not on file   Other Topics Concern  . Not on file   Social History Narrative   Pt originally from the [place] been in [place] for 20 years       Filed Vitals:   11/22/14 1522  BP: 142/86  Pulse: 73  Height: 5\' 3"  (1.6 m)  Weight: 146 lb (66.225 kg)  SpO2: 95%    PHYSICAL EXAM General: NAD HEENT: Normal. Neck: No JVD, no thyromegaly. Lungs: Clear to auscultation bilaterally with normal respiratory effort. CV: Nondisplaced PMI.  Regular rate and rhythm, normal S1/S2, no S3/S4, very soft systolic murmur over RUSB. No pretibial or periankle edema.  No carotid bruit.  Normal pedal pulses.  Abdomen: Soft, nontender, no hepatosplenomegaly, no distention.  Neurologic: Alert and oriented x 3.  Psych: Normal affect. Skin: Normal. Musculoskeletal: Normal range of motion, no gross deformities. Extremities: No clubbing or cyanosis.   ECG: Most recent ECG reviewed.      ASSESSMENT AND PLAN: 1. CAD: Moderate mid LAD disease with 50-60% smooth stenosis, non flow-limiting. Continue ASA and statin. Possibly has  microvascular angina. Will start Imdur 30 mg daily. I informed her about potential headaches which should hopefully resolve after several days of therapy. 2. Essential hypertension: Mildly elevated on Maxzide 37.5/25 mg daily. LVEDP was elevated at 19 mmHg. Had mild LVH and grade I diastolic dysfunction in 7/16. Will repeat echocardiogram to assess for progression. I will add long-acting nitrates for the treatment of possible microvascular angina, which may also reduce BP to some extent. 3. Hyperlipidemia: Currently on pravastatin 80 mg daily. Lipids on 07/15/2014 were normal.  4. Aortic valve disease: Some suggestion of aortic stenosis on pressure tracings. Will repeat echocardiogram.  Dispo: f/u 6 weeks.   [name], M.D., F.A.C.C.

## 2014-11-22 NOTE — Patient Instructions (Addendum)
Your physician recommends that you schedule a follow-up appointment in: 2-3 months with Dr. Shawna Orleans  Your physician has requested that you have an echocardiogram. Echocardiography is a painless test that uses sound waves to create images of your heart. It provides your doctor with information about the size and shape of your heart and how well your heart's chambers and valves are working. This procedure takes approximately one hour. There are no restrictions for this procedure.  START: Imdur 30 mg Daily  Thank you for choosing [facility]!

## 2014-11-23 ENCOUNTER — Other Ambulatory Visit: Payer: Self-pay

## 2014-11-23 MED ORDER — PRAVASTATIN SODIUM 40 MG PO TABS: ORAL_TABLET | ORAL | Status: DC

## 2014-11-23 NOTE — Progress Notes (Signed)
Patient aware and [place] ordered.  She will await appointment.

## 2014-11-23 NOTE — Addendum Note (Signed)
Addended by: Denman George B on: 11/23/2014 02:13 PM   Modules accepted: Orders

## 2014-11-23 NOTE — Addendum Note (Signed)
Addended by: Denman George B on: 11/23/2014 01:54 PM   Modules accepted: Orders

## 2014-11-26 ENCOUNTER — Other Ambulatory Visit: Payer: Self-pay | Admitting: Family Medicine

## 2014-11-26 ENCOUNTER — Ambulatory Visit (HOSPITAL_COMMUNITY): Admission: RE | Admit: 2014-11-26 | Payer: Managed Care, Other (non HMO) | Source: Ambulatory Visit

## 2014-11-26 ENCOUNTER — Ambulatory Visit (HOSPITAL_COMMUNITY)
Admission: RE | Admit: 2014-11-26 | Discharge: 2014-11-26 | Disposition: A | Discharge status: Home / Self Care | Payer: Managed Care, Other (non HMO) | Source: Ambulatory Visit | Attending: Family Medicine | Admitting: Family Medicine

## 2014-11-26 DIAGNOSIS — K760 Fatty (change of) liver, not elsewhere classified: Secondary | ICD-10-CM

## 2014-11-26 DIAGNOSIS — R945 Abnormal results of liver function studies: Secondary | ICD-10-CM

## 2014-11-26 DIAGNOSIS — R7401 Elevation of levels of liver transaminase levels: Secondary | ICD-10-CM

## 2014-11-26 DIAGNOSIS — Z1211 Encounter for screening for malignant neoplasm of colon: Secondary | ICD-10-CM

## 2014-11-26 DIAGNOSIS — R7989 Other specified abnormal findings of blood chemistry: Secondary | ICD-10-CM | POA: Insufficient documentation

## 2014-11-26 DIAGNOSIS — R740 Nonspecific elevation of levels of transaminase and lactic acid dehydrogenase [LDH]: Principal | ICD-10-CM

## 2014-11-29 ENCOUNTER — Ambulatory Visit: Payer: Managed Care, Other (non HMO) | Admitting: Cardiology

## 2014-12-16 ENCOUNTER — Encounter (INDEPENDENT_AMBULATORY_CARE_PROVIDER_SITE_OTHER): Payer: Self-pay | Admitting: *Deleted

## 2014-12-27 ENCOUNTER — Encounter: Payer: Self-pay | Admitting: Family Medicine

## 2014-12-27 ENCOUNTER — Ambulatory Visit (INDEPENDENT_AMBULATORY_CARE_PROVIDER_SITE_OTHER): Payer: Managed Care, Other (non HMO) | Admitting: Family Medicine

## 2014-12-27 VITALS — BP 120/70 | HR 78 | Resp 18 | Ht 64.0 in | Wt 145.1 lb

## 2014-12-27 DIAGNOSIS — N644 Mastodynia: Secondary | ICD-10-CM

## 2014-12-27 DIAGNOSIS — I10 Essential (primary) hypertension: Secondary | ICD-10-CM | POA: Diagnosis not present

## 2014-12-27 DIAGNOSIS — E785 Hyperlipidemia, unspecified: Secondary | ICD-10-CM | POA: Diagnosis not present

## 2014-12-27 NOTE — Patient Instructions (Addendum)
F/u in June as before  Breast exam is normal as is your recent mammogram  Blood pressure has improved  Fasting labs for June visit  It is important that you exercise regularly at least 30 minutes 5 times a week. If you develop chest pain, have severe difficulty breathing, or feel very tired, stop exercising immediately and seek medical attention   Follow  a low fat diet , rich in vegetable and fruit

## 2015-01-05 ENCOUNTER — Ambulatory Visit (INDEPENDENT_AMBULATORY_CARE_PROVIDER_SITE_OTHER): Payer: Managed Care, Other (non HMO) | Admitting: Internal Medicine

## 2015-01-09 DIAGNOSIS — N644 Mastodynia: Secondary | ICD-10-CM | POA: Insufficient documentation

## 2015-01-09 NOTE — Assessment & Plan Note (Signed)
Controlled, no change in medication DASH diet and commitment to daily physical activity for a minimum of 30 minutes discussed and encouraged, as a part of hypertension management. The importance of attaining a healthy weight is also discussed.  BP/Weight 12/27/2014 11/22/2014 11/19/2014 11/15/2014 11/08/2014 07/02/2014 11/08/1386  Systolic BP 719 597 471 855 015 868 257  Diastolic BP 70 86 61 78 76 76 72  Wt. (Lbs) 145.12 146 145 149.4 148 140.5 140  BMI 24.9 25.87 25.69 26.47 25.39 24.1 25.18

## 2015-01-09 NOTE — Assessment & Plan Note (Signed)
Hyperlipidemia:Low fat diet discussed and encouraged.   Lipid Panel  Lab Results  Component Value Date   CHOL 185 11/13/2014   HDL 66 11/13/2014   LDLCALC 95 11/13/2014   TRIG 118 11/13/2014   CHOLHDL 2.8 11/13/2014      Updated lab needed at/ before next visit.

## 2015-01-09 NOTE — Progress Notes (Signed)
   Subjective:    Patient ID: Tanya [name], female    DOB: 10-26-1950, 64 y.o.   MRN: [id number]  HPI Pt in with concern of tingling discomforyt in the left breast whch she has bbeen experiencing in the past week. No associated light headedness, nausea, diaphoresis, not aggravated by movement. Recently had a normal mammogram, and is being evaluated by cardiology which I believe is causing some anxiety, and she feels the need to come in to discuss this.Has c/o of exertional fatigue, cardiology evaL recommended by pulmonary  Review of Systems See HPI Denies recent fever or chills. Denies sinus pressure, nasal congestion, ear pain or sore throat. Denies chest congestion, productive cough or wheezing. Denies chest pains, palpitations and leg swelling Denies abdominal pain, nausea, vomiting,diarrhea or constipation.   Denies dysuria, frequency, hesitancy or incontinence. Denies joint pain, swelling and limitation in mobility. Denies headaches, seizures, numbness, or tingling. Denies depression, anxiety or insomnia. Denies skin break down or rash.        Objective:   Physical Exam BP 120/70 mmHg  Pulse 78  Resp 18  Ht 5\' 4"  (1.626 m)  Wt 145 lb 1.9 oz (65.826 kg)  BMI 24.90 kg/m2  SpO2 97% Patient alert and oriented and in no cardiopulmonary distress.  HEENT: No facial asymmetry, EOMI,   oropharynx pink and moist.  Neck supple no JVD, no mass.  Chest: Clear to auscultation bilaterally. Breast: no asymetry, no mass, no nipple inversion or nipple d/c , no axillary or supraclavicular adenopathy CVS: S1, S2 no murmurs, no S3.Regular rate.  ABD: Soft non tender.   Ext: No edema  MS: Adequate ROM spine, shoulders, hips and knees.  Skin: Intact, no ulcerations or rash noted.  Psych: Good eye contact, normal affect. Memory intact mildly  anxious not  depressed appearing.  CNS: CN 2-12 intact, power,  normal throughout.no focal deficits noted.        Assessment & Plan:    Mastalgia in female Normal breast exam and mammogram normal less than 1 month ago. Pt reassured     Essential hypertension Controlled, no change in medication DASH diet and commitment to daily physical activity for a minimum of 30 minutes discussed and encouraged, as a part of hypertension management. The importance of attaining a healthy weight is also discussed.  BP/Weight 12/27/2014 11/22/2014 11/19/2014 11/15/2014 11/08/2014 07/02/2014 7/67/2094  Systolic BP 709 628 366 294 765 465 035  Diastolic BP 70 86 61 78 76 76 72  Wt. (Lbs) 145.12 146 145 149.4 148 140.5 140  BMI 24.9 25.87 25.69 26.47 25.39 24.1 25.18         Hyperlipemia Hyperlipidemia:Low fat diet discussed and encouraged.   Lipid Panel  Lab Results  Component Value Date   CHOL 185 11/13/2014   HDL 66 11/13/2014   LDLCALC 95 11/13/2014   TRIG 118 11/13/2014   CHOLHDL 2.8 11/13/2014      Updated lab needed at/ before next visit.

## 2015-01-09 NOTE — Assessment & Plan Note (Signed)
Normal breast exam and mammogram normal less than 1 month ago. Pt reassured

## 2015-01-20 ENCOUNTER — Other Ambulatory Visit: Payer: Self-pay | Admitting: Family Medicine

## 2015-01-25 ENCOUNTER — Ambulatory Visit (INDEPENDENT_AMBULATORY_CARE_PROVIDER_SITE_OTHER): Payer: Managed Care, Other (non HMO) | Admitting: Internal Medicine

## 2015-02-04 ENCOUNTER — Encounter: Payer: Self-pay | Admitting: Cardiovascular Disease

## 2015-02-04 ENCOUNTER — Ambulatory Visit (INDEPENDENT_AMBULATORY_CARE_PROVIDER_SITE_OTHER): Payer: Managed Care, Other (non HMO) | Admitting: Cardiovascular Disease

## 2015-02-04 VITALS — BP 118/78 | HR 87 | Ht 63.0 in | Wt 143.0 lb

## 2015-02-04 DIAGNOSIS — I25118 Atherosclerotic heart disease of native coronary artery with other forms of angina pectoris: Secondary | ICD-10-CM | POA: Diagnosis not present

## 2015-02-04 DIAGNOSIS — E785 Hyperlipidemia, unspecified: Secondary | ICD-10-CM

## 2015-02-04 DIAGNOSIS — J449 Chronic obstructive pulmonary disease, unspecified: Secondary | ICD-10-CM

## 2015-02-04 DIAGNOSIS — R0609 Other forms of dyspnea: Secondary | ICD-10-CM

## 2015-02-04 DIAGNOSIS — I350 Nonrheumatic aortic (valve) stenosis: Secondary | ICD-10-CM

## 2015-02-04 DIAGNOSIS — I10 Essential (primary) hypertension: Secondary | ICD-10-CM

## 2015-02-04 MED ORDER — ALBUTEROL SULFATE HFA 108 (90 BASE) MCG/ACT IN AERS: 1.0000 | INHALATION_SPRAY | Freq: Four times a day (QID) | RESPIRATORY_TRACT | Status: DC | PRN

## 2015-02-04 NOTE — Progress Notes (Signed)
Patient ID: Kristine Garbe, female   DOB: 05-15-1951, 64 y.o.   MRN: [id number]      SUBJECTIVE: The patient presents for follow-up of nonobstructive coronary artery disease. She cannot remember if she had the echocardiogram performed, but I am unable to locate it in electronic medical record. She continues to experience exertional dyspnea. She does not feel the addition of isosorbide mononitrate helped. I previously prescribed albuterol but she did not get it filled and she was afraid that it would cause her to shake. She previously saw a pulmonologist who reportedly did not have any additional recommendations after undergoing PFTs.  Prior coronary angiogram demonstrated a mid LAD 50-60% stenosis which was not flow limiting. The remaining epicardial coronary vessels were either angiographically normal or had mild luminal irregularities. LVEDP was elevated at 19 mmHg. There was some suggestion of mild aortic stenosis by pressure tracings, not discerned by echocardiography in May 2015.   Review of Systems: As per "subjective", otherwise negative.  No Known Allergies  Current Outpatient Prescriptions  Medication Sig Dispense Refill  . aspirin 81 MG tablet Take 81 mg by mouth daily.    . cetirizine (ZYRTEC) 10 MG tablet Take 1 tablet (10 mg total) by mouth daily. 30 tablet 4  . clobetasol cream (TEMOVATE) 9.02 % Apply 1 application topically 2 (two) times daily. Apply sparingly twice daily to rash for 2 days , then as needed 45 g 0  . COLLAGEN PO Take 1 capsule by mouth daily.    . fish oil-omega-3 fatty acids 1000 MG capsule Take 2 g by mouth daily.    . isosorbide mononitrate (IMDUR) 30 MG 24 hr tablet Take 1 tablet (30 mg total) by mouth daily. 90 tablet 3  . Misc Natural Products (OSTEO BI-FLEX ADV TRIPLE ST PO) Take 1 tablet by mouth daily.    . Multiple Vitamins-Minerals (CENTRUM) tablet Take 1 tablet by mouth daily.    Marland Kitchen omeprazole (PRILOSEC) 20 MG capsule TAKE ONE CAPSULE BY MOUTH ONCE  DAILY 90 capsule 3  . Potassium Gluconate 595 MG CAPS Take 595 capsules by mouth daily.    . pravastatin (PRAVACHOL) 40 MG tablet TAKE ONE TABLET BY MOUTH ONCE DAILY 30 tablet 2  . solifenacin (VESICARE) 5 MG tablet Take 1 tablet (5 mg total) by mouth daily. 30 tablet 4  . triamterene-hydrochlorothiazide (MAXZIDE-25) 37.5-25 MG per tablet TAKE ONE TABLET BY MOUTH ONCE DAILY 30 tablet 4  . vitamin C (ASCORBIC ACID) 500 MG tablet Take 500 mg by mouth daily.     No current facility-administered medications for this visit.    Past Medical History  Diagnosis Date  . Cystitis   . Anhedonia   . Unspecified hypothyroidism   . Hyperlipidemia   . Insomnia   . Hypertension   . Vaginitis   . GERD (gastroesophageal reflux disease)     Past Surgical History  Procedure Laterality Date  . Left heart catheterization with coronary angiogram N/A 11/19/2014    Procedure: LEFT HEART CATHETERIZATION WITH CORONARY ANGIOGRAM;  Surgeon: [name], MD;  Location: [facility] CATH LAB;  Service: Cardiovascular;  Laterality: N/A;    History   Social History  . Marital Status: Married    Spouse Name: N/A  . Number of Children: N/A  . Years of Education: N/A   Occupational History  . Not on file.   Social History Main Topics  . Smoking status: Never Smoker   . Smokeless tobacco: Never Used  . Alcohol Use: No  .  Drug Use: No  . Sexual Activity: Not on file   Other Topics Concern  . Not on file   Social History Narrative   Pt originally from the [place] been in [place] for 20 years       Filed Vitals:   02/04/15 1617  BP: 118/78  Pulse: 87  Height: 5\' 3"  (1.6 m)  Weight: 143 lb (64.864 kg)  SpO2: 94%    PHYSICAL EXAM General: NAD HEENT: Normal. Neck: No JVD, no thyromegaly. Lungs: Clear to auscultation bilaterally with normal respiratory effort. CV: Nondisplaced PMI. Regular rate and rhythm, normal S1/S2, no S3/S4, very soft systolic murmur over RUSB. No pretibial or periankle edema.  No carotid bruit. Normal pedal pulses.  Abdomen: Soft, nontender, no hepatosplenomegaly, no distention.  Neurologic: Alert and oriented x 3.  Psych: Normal affect. Skin: Normal. Musculoskeletal: Normal range of motion, no gross deformities. Extremities: No clubbing or cyanosis.    ECG: Most recent ECG reviewed.      ASSESSMENT AND PLAN: 1. CAD: Moderate mid LAD disease with 50-60% smooth stenosis, non flow-limiting. Continue ASA and statin. Possibly has microvascular angina. Added Imdur 30 mg daily at last visit but of uncertain benefit. Will d/c.  2. Essential hypertension: Normal today on Maxzide 37.5/25 mg daily. LVEDP was elevated at 19 mmHg. Had mild LVH and grade I diastolic dysfunction in 1/17. Will contact echo dept on Monday to see if echocardiogram to assess for progression of aortic stenosis was performed.  3. Hyperlipidemia: Currently on pravastatin 80 mg daily. Lipids on 07/15/2014 were normal.   4. Aortic valve disease: Some suggestion of aortic stenosis on pressure tracings. Will contact echo dept on Monday to see if echocardiogram to assess for progression of aortic stenosis was performed.  5. SOB: Will again prescribe albuterol prn. Will check to see if echo performed.  Dispo: f/u 6 months.   [name], M.D., F.A.C.C.

## 2015-02-04 NOTE — Patient Instructions (Signed)
Your physician recommends that you schedule a follow-up appointment in: 6 months with Jory Sims, NP  Your physician has recommended you make the following change in your medication:   Stop Imdur  Start Albuterol   Thank you for choosing [facility]!

## 2015-02-07 ENCOUNTER — Telehealth: Payer: Self-pay | Admitting: Cardiovascular Disease

## 2015-02-07 NOTE — Telephone Encounter (Signed)
Albuterol is not covered under insurance.  Needs something else prescribed  Would like to know Isosorb Mono ER or Imdur  has been discontinued.    Will be available all day on cell phone rest of the afternoon. Tomorrow around 9am

## 2015-02-08 NOTE — Telephone Encounter (Signed)
Left message to call.

## 2015-02-08 NOTE — Telephone Encounter (Signed)
Forwarding to [place] as patient was last seen by Dr. Bronson Ing 02/04/2015.

## 2015-02-08 NOTE — Telephone Encounter (Signed)
Patient came by office. She is available now by cell phone  In the mornings she is only available around 9 a.m.  She works and this is her break time

## 2015-02-09 NOTE — Telephone Encounter (Signed)
Spoke with patient. Explained that Imdur has been D/C'd.

## 2015-02-10 ENCOUNTER — Other Ambulatory Visit: Payer: Self-pay

## 2015-02-10 MED ORDER — ALBUTEROL SULFATE HFA 108 (90 BASE) MCG/ACT IN AERS: 2.0000 | INHALATION_SPRAY | Freq: Four times a day (QID) | RESPIRATORY_TRACT | Status: DC | PRN

## 2015-03-16 ENCOUNTER — Ambulatory Visit: Payer: Managed Care, Other (non HMO) | Admitting: Family Medicine

## 2015-04-21 ENCOUNTER — Other Ambulatory Visit: Payer: Self-pay | Admitting: Family Medicine

## 2015-05-06 ENCOUNTER — Encounter (INDEPENDENT_AMBULATORY_CARE_PROVIDER_SITE_OTHER): Payer: Self-pay | Admitting: *Deleted

## 2015-05-06 ENCOUNTER — Encounter (INDEPENDENT_AMBULATORY_CARE_PROVIDER_SITE_OTHER): Payer: Self-pay | Admitting: Internal Medicine

## 2015-05-06 ENCOUNTER — Ambulatory Visit (INDEPENDENT_AMBULATORY_CARE_PROVIDER_SITE_OTHER): Payer: Managed Care, Other (non HMO) | Admitting: Internal Medicine

## 2015-05-06 VITALS — BP 122/60 | HR 72 | Temp 98.0°F | Ht 62.0 in | Wt 142.1 lb

## 2015-05-06 DIAGNOSIS — K760 Fatty (change of) liver, not elsewhere classified: Secondary | ICD-10-CM | POA: Diagnosis not present

## 2015-05-06 DIAGNOSIS — R748 Abnormal levels of other serum enzymes: Secondary | ICD-10-CM | POA: Diagnosis not present

## 2015-05-06 LAB — IRON AND TIBC
%SAT: 31 % (ref 20–55)
IRON: 104 ug/dL (ref 42–145)
TIBC: 334 ug/dL (ref 250–470)
UIBC: 230 ug/dL (ref 125–400)

## 2015-05-06 LAB — HEPATIC FUNCTION PANEL
ALBUMIN: 4.4 g/dL (ref 3.6–5.1)
ALT: 26 U/L (ref 6–29)
AST: 30 U/L (ref 10–35)
Alkaline Phosphatase: 50 U/L (ref 33–130)
BILIRUBIN DIRECT: 0.1 mg/dL (ref <=0.2)
BILIRUBIN INDIRECT: 0.3 mg/dL (ref 0.2–1.2)
BILIRUBIN TOTAL: 0.4 mg/dL (ref 0.2–1.2)
TOTAL PROTEIN: 7.6 g/dL (ref 6.1–8.1)

## 2015-05-06 LAB — FERRITIN: FERRITIN: 120 ng/mL (ref 10–291)

## 2015-05-06 LAB — HEPATITIS PANEL, ACUTE
HCV Ab: NEGATIVE
HEP A IGM: NONREACTIVE
HEP B C IGM: NONREACTIVE
HEP B S AG: NEGATIVE

## 2015-05-06 NOTE — Patient Instructions (Signed)
OV in 3 months. 

## 2015-05-06 NOTE — Progress Notes (Signed)
Subjective:    Patient ID: Tanya [name], female    DOB: [birth year], 64 y.o.   MRN: [id number]  HPI Referred to our office by Dr. Moshe Cipro for elevated liver enzymes.  Appears liver enzymes have been elevated slightly since 2013.  She also tells me she has acid reflux. She says she has acid reflux every day. If she misses a dose of Omeprazole she will have acid reflux.  She takes Omeprazole before she eats breakfast. She says she has tenderness in her epigastric region for about a year. No hx of IV drug use or etoh abuse. No tattoos.  Appetite is good. No weight loss. She has some epigastric tenderness. BMs are normal. She usually has a BM every day. No melena or BRRB. She denies every having jaundice. She exercises every day x 30 minutes 09/25/2012 Hep B surface antigen negative, Hep B IgM negative. Hep A IgM negative, HCV antibody negative. Patient is on Pravastatin for cholesterol.  Hepatic Function Panel     Component Value Date/Time   PROT 7.7 11/13/2014 0801   ALBUMIN 4.3 11/13/2014 0801   AST 42* 11/13/2014 0801   ALT 65* 11/13/2014 0801   ALKPHOS 63 11/13/2014 0801   BILITOT 0.5 11/13/2014 0801   BILIDIR 0.1 01/05/2013 1604   IBILI 0.2 01/05/2013 1604    CMP Latest Ref Rng 11/13/2014 11/05/2014 07/31/2014  Glucose 70 - 99 mg/dL 96 - 88  BUN 6 - 23 mg/dL 14 - 16  Creatinine 0.50 - 1.10 mg/dL 0.69 1.00 0.63  Sodium 135 - 145 mEq/L 139 - 140  Potassium 3.5 - 5.3 mEq/L 3.7 - 3.4(L)  Chloride 96 - 112 mEq/L 100 - 104  CO2 19 - 32 mEq/L 31 - 31  Calcium 8.4 - 10.5 mg/dL 9.9 - 9.4  Total Protein 6.0 - 8.3 g/dL 7.7 - 7.2  Total Bilirubin 0.2 - 1.2 mg/dL 0.5 - 0.4  Alkaline Phos 39 - 117 U/L 63 - 53  AST 0 - 37 U/L 42(H) - 28  ALT 0 - 35 U/L 65(H) - 24   Lipid Panel     Component Value Date/Time   CHOL 185 11/13/2014 0801   TRIG 118 11/13/2014 0801   HDL 66 11/13/2014 0801   CHOLHDL 2.8 11/13/2014 0801   VLDL 24 11/13/2014 0801   LDLCALC 95 11/13/2014 0801        11/26/2014 [place] RUQ: elevated liver enzymes No evidence of gallstones or biliary dilatation.  Hepatic steatosis/diffuse hepatocellular disease. Consider hepatic elastography ultrasound for further evaluation to assess for risk of hepatic fibrosis/cirrhosis.    Review of Systems     Past Medical History  Diagnosis Date  . Cystitis   . Anhedonia   . Unspecified hypothyroidism   . Hyperlipidemia   . Insomnia   . Hypertension   . Vaginitis   . GERD (gastroesophageal reflux disease)   . Elevated liver enzymes   . Fatty liver     Past Surgical History  Procedure Laterality Date  . Left heart catheterization with coronary angiogram N/A 11/19/2014    Procedure: LEFT HEART CATHETERIZATION WITH CORONARY ANGIOGRAM;  Surgeon: [name], MD;  Location: [facility] CATH LAB;  Service: Cardiovascular;  Laterality: N/A;    No Known Allergies  Current Outpatient Prescriptions on File Prior to Visit  Medication Sig Dispense Refill  . aspirin 81 MG tablet Take 81 mg by mouth daily.    . cetirizine (ZYRTEC) 10 MG tablet Take 1 tablet (10 mg total) by  mouth daily. 30 tablet 4  . COLLAGEN PO Take 1 capsule by mouth daily.    . fish oil-omega-3 fatty acids 1000 MG capsule Take 2 g by mouth daily.    . Misc Natural Products (OSTEO BI-FLEX ADV TRIPLE ST PO) Take 1 tablet by mouth daily.    . Multiple Vitamins-Minerals (CENTRUM) tablet Take 1 tablet by mouth daily.    Marland Kitchen omeprazole (PRILOSEC) 20 MG capsule TAKE ONE CAPSULE BY MOUTH ONCE DAILY 90 capsule 3  . Potassium Gluconate 595 MG CAPS Take 595 capsules by mouth daily.    . pravastatin (PRAVACHOL) 40 MG tablet TAKE ONE TABLET BY MOUTH ONCE DAILY 30 tablet 3  . triamterene-hydrochlorothiazide (MAXZIDE-25) 37.5-25 MG per tablet TAKE ONE TABLET BY MOUTH ONCE DAILY 30 tablet 3  . vitamin C (ASCORBIC ACID) 500 MG tablet Take 500 mg by mouth daily.     No current facility-administered medications on file prior to visit.     Objective:    Physical Exam Blood pressure 122/60, pulse 72, temperature 98 F (36.7 C), height 5\' 2"  (1.575 m), weight 142 lb 1.6 oz (64.456 kg).  Alert and oriented. Skin warm and dry. Oral mucosa is moist.   . Sclera anicteric, conjunctivae is pink. Thyroid not enlarged. No cervical lymphadenopathy. Lungs clear. Heart regular rate and rhythm.  Abdomen is soft. Bowel sounds are positive. No hepatomegaly. No abdominal masses felt. No tenderness.  No edema to lower extremities.        Assessment & Plan:  GERD: not controlled at this time with Omeprazole. Am going to switch her to Protonix BID and see how she does. Elevated liver enzymes, fatty liver: Acute hepatitis panel, alpha 1 , SMA,, ANA, sedrate, PT/INR, ferritin, iron, [place] elastrography.

## 2015-05-07 LAB — PROTIME-INR
INR: 0.95 (ref <1.50)
PROTHROMBIN TIME: 12.7 s (ref 11.6–15.2)

## 2015-05-07 LAB — SEDIMENTATION RATE: SED RATE: 9 mm/h (ref 0–30)

## 2015-05-09 LAB — ANA: ANA: NEGATIVE

## 2015-05-10 LAB — ANTI-SMOOTH MUSCLE ANTIBODY, IGG: Smooth Muscle Ab: 9 U (ref <20)

## 2015-05-13 ENCOUNTER — Ambulatory Visit (HOSPITAL_COMMUNITY)
Admission: RE | Admit: 2015-05-13 | Discharge: 2015-05-13 | Disposition: A | Discharge status: Home / Self Care | Payer: Managed Care, Other (non HMO) | Source: Ambulatory Visit | Attending: Internal Medicine | Admitting: Internal Medicine

## 2015-05-13 ENCOUNTER — Other Ambulatory Visit: Payer: Self-pay | Admitting: Family Medicine

## 2015-05-13 ENCOUNTER — Ambulatory Visit (HOSPITAL_COMMUNITY): Payer: Managed Care, Other (non HMO)

## 2015-05-13 DIAGNOSIS — K760 Fatty (change of) liver, not elsewhere classified: Secondary | ICD-10-CM | POA: Diagnosis not present

## 2015-05-13 DIAGNOSIS — R748 Abnormal levels of other serum enzymes: Secondary | ICD-10-CM

## 2015-05-13 DIAGNOSIS — Z1231 Encounter for screening mammogram for malignant neoplasm of breast: Secondary | ICD-10-CM

## 2015-06-30 ENCOUNTER — Other Ambulatory Visit: Payer: Self-pay

## 2015-06-30 MED ORDER — PRAVASTATIN SODIUM 40 MG PO TABS: 40.0000 mg | ORAL_TABLET | Freq: Every day | ORAL | Status: DC

## 2015-08-04 ENCOUNTER — Encounter: Payer: Self-pay | Admitting: Family Medicine

## 2015-08-04 ENCOUNTER — Ambulatory Visit (INDEPENDENT_AMBULATORY_CARE_PROVIDER_SITE_OTHER): Payer: Managed Care, Other (non HMO) | Admitting: Family Medicine

## 2015-08-04 VITALS — BP 120/70 | HR 73 | Resp 16 | Ht 62.0 in | Wt 143.0 lb

## 2015-08-04 DIAGNOSIS — R7302 Impaired glucose tolerance (oral): Secondary | ICD-10-CM

## 2015-08-04 DIAGNOSIS — N644 Mastodynia: Secondary | ICD-10-CM

## 2015-08-04 DIAGNOSIS — Z1211 Encounter for screening for malignant neoplasm of colon: Secondary | ICD-10-CM

## 2015-08-04 DIAGNOSIS — I10 Essential (primary) hypertension: Secondary | ICD-10-CM | POA: Diagnosis not present

## 2015-08-04 DIAGNOSIS — N6320 Unspecified lump in the left breast, unspecified quadrant: Secondary | ICD-10-CM | POA: Insufficient documentation

## 2015-08-04 DIAGNOSIS — N63 Unspecified lump in breast: Secondary | ICD-10-CM | POA: Diagnosis not present

## 2015-08-04 DIAGNOSIS — E785 Hyperlipidemia, unspecified: Secondary | ICD-10-CM | POA: Diagnosis not present

## 2015-08-04 DIAGNOSIS — R3129 Other microscopic hematuria: Secondary | ICD-10-CM

## 2015-08-04 DIAGNOSIS — Z1159 Encounter for screening for other viral diseases: Secondary | ICD-10-CM

## 2015-08-04 NOTE — Patient Instructions (Signed)
F/u in 4 month, call if you need me sooner  You  are referred for urgent imaging of left breast, also for colonoscopy, Dr Olevia Perches office will mail you a letter to call them  Fasting labs this week please  Thanks for choosing [facility], we consider it a privelige to serve you.

## 2015-08-04 NOTE — Assessment & Plan Note (Signed)
1 wek h/o left breast pain and mass at 3 o clock

## 2015-08-04 NOTE — Progress Notes (Signed)
Subjective:    Patient ID: Tanya [name], female    DOB: [birth year], 64 y.o.   MRN: [id number]  HPI Pt in with concern of left breast pain , no h/o trauma , no nipple d/c .,imaging is up to date She is tearful and crying states she hopes "nothing is wrong" hopes to travel o the [place] next year for the first time in 20 years Still needs colonoscopy   Review of Systems See HPI. Denies recent fever or chills. Denies sinus pressure, nasal congestion, ear pain or sore throat. Denies chest congestion, productive cough or wheezing. Denies chest pains, palpitations and leg swelling Denies abdominal pain, nausea, vomiting,diarrhea or constipation.   Denies dysuria, frequency, hesitancy or incontinence. Denies joint pain, swelling and limitation in mobility. Denies headaches, seizures, numbness, or tingling.  Denies skin break down or rash.         Objective:   Physical Exam BP 120/70 mmHg  Pulse 73  Resp 16  Ht 5\' 2"  (1.575 m)  Wt 143 lb (64.864 kg)  BMI 26.15 kg/m2  SpO2 99% Patient alert and oriented and in no cardiopulmonary distress.  HEENT: No facial asymmetry, EOMI,   oropharynx pink and moist.  Neck supple no JVD, no mass.  Chest: Clear to auscultation bilaterally. Breast: 3 cm mass at 3 o clock on left breast, no nipple d/c or inversion of either breast, no axillary or supraclavicular adenopathy CVS: S1, S2 no murmurs, no S3.Regular rate.  ABD: Soft non tender.   Ext: No edema  MS: Adequate ROM spine, shoulders, hips and knees.  Skin: Intact, no ulcerations or rash noted.  Psych: Good eye contact, normal affect. Memory intact, tearful,   anxious not  depressed appearing.  CNS: CN 2-12 intact, power,  normal throughout.no focal deficits noted.        Assessment & Plan:  Left breast mass 1 wek h/o left breast pain and mass at 3 o clock  Essential hypertension Controlled, no change in medication DASH diet and commitment to daily physical  activity for a minimum of 30 minutes discussed and encouraged, as a part of hypertension management. The importance of attaining a healthy weight is also discussed.  BP/Weight 08/04/2015 05/06/2015 02/04/2015 12/27/2014 11/22/2014 11/19/2014 7/84/6962  Systolic BP 952 841 324 401 027 253 664  Diastolic BP 70 60 78 70 86 61 78  Wt. (Lbs) 143 142.1 143 145.12 146 145 149.4  BMI 26.15 25.98 25.34 24.9 25.87 25.69 26.47        IGT (impaired glucose tolerance) Patient educated about the importance of limiting  Carbohydrate intake , the need to commit to daily physical activity for a minimum of 30 minutes , and to commit weight loss. The fact that changes in all these areas will reduce or eliminate all together the development of diabetes is stressed.  Deteriorated, will need rept test in 4.5 month, re educated after the visit when result availabale  Diabetic Labs Latest Ref Rng 08/04/2015 11/13/2014 11/05/2014 07/31/2014 01/30/2014  HbA1c <5.7 % 6.4(H) 6.0(H) - 6.1(H) -  Chol 125 - 200 mg/dL 173 185 - 162 215(H)  HDL >=46 mg/dL 61 66 - 55 68  Calc LDL <130 mg/dL 89 95 - 83 118(H)  Triglycerides <150 mg/dL 116 118 - 119 145  Creatinine 0.50 - 0.99 mg/dL 0.72 0.69 1.00 0.63 0.72   BP/Weight 08/04/2015 05/06/2015 02/04/2015 12/27/2014 11/22/2014 11/19/2014 01/01/4741  Systolic BP 595 638 756 433 295 188 416  Diastolic BP 70 60 78  70 86 61 78  Wt. (Lbs) 143 142.1 143 145.12 146 145 149.4  BMI 26.15 25.98 25.34 24.9 25.87 25.69 26.47   No flowsheet data found.     Hyperlipemia Controlled, no change in medication Hyperlipidemia:Low fat diet discussed and encouraged.   Lipid Panel  Lab Results  Component Value Date   CHOL 173 08/04/2015   HDL 61 08/04/2015   LDLCALC 89 08/04/2015   TRIG 116 08/04/2015   CHOLHDL 2.8 08/04/2015         Microscopic hematuria Repeat uA needed, will request same

## 2015-08-06 LAB — COMPREHENSIVE METABOLIC PANEL
ALBUMIN: 4.2 g/dL (ref 3.6–5.1)
ALT: 27 U/L (ref 6–29)
AST: 26 U/L (ref 10–35)
Alkaline Phosphatase: 64 U/L (ref 33–130)
BUN: 15 mg/dL (ref 7–25)
CHLORIDE: 101 mmol/L (ref 98–110)
CO2: 31 mmol/L (ref 20–31)
CREATININE: 0.72 mg/dL (ref 0.50–0.99)
Calcium: 9.7 mg/dL (ref 8.6–10.4)
GLUCOSE: 98 mg/dL (ref 65–99)
Potassium: 3.9 mmol/L (ref 3.5–5.3)
SODIUM: 139 mmol/L (ref 135–146)
Total Bilirubin: 0.4 mg/dL (ref 0.2–1.2)
Total Protein: 7.2 g/dL (ref 6.1–8.1)

## 2015-08-06 LAB — LIPID PANEL
CHOL/HDL RATIO: 2.8 ratio (ref <=5.0)
Cholesterol: 173 mg/dL (ref 125–200)
HDL: 61 mg/dL (ref >=46)
LDL CALC: 89 mg/dL (ref <130)
Triglycerides: 116 mg/dL (ref <150)
VLDL: 23 mg/dL (ref <30)

## 2015-08-06 LAB — HIV ANTIBODY (ROUTINE TESTING W REFLEX): HIV 1&2 Ab, 4th Generation: NONREACTIVE

## 2015-08-06 LAB — TSH: TSH: 1.272 u[IU]/mL (ref 0.350–4.500)

## 2015-08-07 LAB — HEMOGLOBIN A1C
Hgb A1c MFr Bld: 6.4 % — ABNORMAL HIGH (ref <5.7)
MEAN PLASMA GLUCOSE: 137 mg/dL — AB (ref <117)

## 2015-08-09 ENCOUNTER — Encounter (INDEPENDENT_AMBULATORY_CARE_PROVIDER_SITE_OTHER): Payer: Self-pay | Admitting: *Deleted

## 2015-08-09 ENCOUNTER — Other Ambulatory Visit (HOSPITAL_COMMUNITY): Payer: Managed Care, Other (non HMO)

## 2015-08-14 ENCOUNTER — Telehealth: Payer: Self-pay | Admitting: Family Medicine

## 2015-08-14 NOTE — Assessment & Plan Note (Signed)
Patient educated about the importance of limiting  Carbohydrate intake , the need to commit to daily physical activity for a minimum of 30 minutes , and to commit weight loss. The fact that changes in all these areas will reduce or eliminate all together the development of diabetes is stressed.  Deteriorated, will need rept test in 4.5 month, re educated after the visit when result availabale  Diabetic Labs Latest Ref Rng 08/04/2015 11/13/2014 11/05/2014 07/31/2014 01/30/2014  HbA1c <5.7 % 6.4(H) 6.0(H) - 6.1(H) -  Chol 125 - 200 mg/dL 173 185 - 162 215(H)  HDL >=46 mg/dL 61 66 - 55 68  Calc LDL <130 mg/dL 89 95 - 83 118(H)  Triglycerides <150 mg/dL 116 118 - 119 145  Creatinine 0.50 - 0.99 mg/dL 0.72 0.69 1.00 0.63 0.72   BP/Weight 08/04/2015 05/06/2015 02/04/2015 12/27/2014 11/22/2014 11/19/2014 0000000  Systolic BP 123456 [id number] 123456 123456 [id number] [id number]  Diastolic BP 70 60 78 70 86 61 78  Wt. (Lbs) 143 142.1 143 145.12 146 145 149.4  BMI 26.15 25.98 25.34 24.9 25.87 25.69 26.47   No flowsheet data found.

## 2015-08-14 NOTE — Telephone Encounter (Signed)
Pls contact pt, she has h/o microscopic hematuria, no check since 2014, still a trace, urine to be done at lab pls /? pls ask

## 2015-08-14 NOTE — Assessment & Plan Note (Signed)
Controlled, no change in medication DASH diet and commitment to daily physical activity for a minimum of 30 minutes discussed and encouraged, as a part of hypertension management. The importance of attaining a healthy weight is also discussed.  BP/Weight 08/04/2015 05/06/2015 02/04/2015 12/27/2014 11/22/2014 11/19/2014 0000000  Systolic BP 123456 123XX123 123456 123456 [id number] 123456  Diastolic BP 70 60 78 70 86 61 78  Wt. (Lbs) 143 142.1 143 145.12 146 145 149.4  BMI 26.15 25.98 25.34 24.9 25.87 25.69 26.47

## 2015-08-14 NOTE — Assessment & Plan Note (Signed)
Repeat uA needed, will request same

## 2015-08-14 NOTE — Assessment & Plan Note (Signed)
Controlled, no change in medication Hyperlipidemia:Low fat diet discussed and encouraged.   Lipid Panel  Lab Results  Component Value Date   CHOL 173 08/04/2015   HDL 61 08/04/2015   LDLCALC 89 08/04/2015   TRIG 116 08/04/2015   CHOLHDL 2.8 08/04/2015

## 2015-08-15 NOTE — Telephone Encounter (Signed)
Called patient and left message for them to return call at the office   

## 2015-08-15 NOTE — Telephone Encounter (Signed)
Will come tomorrow and leave a urine for urinalysis only and info will be given on prediabetes

## 2015-08-16 ENCOUNTER — Ambulatory Visit (HOSPITAL_COMMUNITY)
Admission: RE | Admit: 2015-08-16 | Discharge: 2015-08-16 | Disposition: A | Discharge status: Home / Self Care | Payer: Managed Care, Other (non HMO) | Source: Ambulatory Visit | Attending: Family Medicine | Admitting: Family Medicine

## 2015-08-16 ENCOUNTER — Other Ambulatory Visit: Payer: Self-pay | Admitting: Family Medicine

## 2015-08-16 ENCOUNTER — Encounter (HOSPITAL_COMMUNITY): Payer: Managed Care, Other (non HMO)

## 2015-08-16 ENCOUNTER — Other Ambulatory Visit: Payer: Self-pay

## 2015-08-16 DIAGNOSIS — N644 Mastodynia: Secondary | ICD-10-CM

## 2015-08-16 DIAGNOSIS — N6320 Unspecified lump in the left breast, unspecified quadrant: Secondary | ICD-10-CM

## 2015-08-16 DIAGNOSIS — N63 Unspecified lump in breast: Secondary | ICD-10-CM | POA: Insufficient documentation

## 2015-08-16 DIAGNOSIS — Z1211 Encounter for screening for malignant neoplasm of colon: Secondary | ICD-10-CM

## 2015-08-16 NOTE — Addendum Note (Signed)
Addended by: Eual Fines on: 08/16/2015 09:52 AM   Modules accepted: Orders

## 2015-08-17 ENCOUNTER — Other Ambulatory Visit: Payer: Self-pay

## 2015-08-17 DIAGNOSIS — Z1231 Encounter for screening mammogram for malignant neoplasm of breast: Secondary | ICD-10-CM

## 2015-08-17 DIAGNOSIS — R319 Hematuria, unspecified: Secondary | ICD-10-CM

## 2015-08-18 LAB — URINALYSIS
BILIRUBIN URINE: NEGATIVE
Glucose, UA: NEGATIVE
HGB URINE DIPSTICK: NEGATIVE
KETONES UR: NEGATIVE
Leukocytes, UA: NEGATIVE
Nitrite: NEGATIVE
PROTEIN: NEGATIVE
Specific Gravity, Urine: 1.009 (ref 1.001–1.035)
pH: 7.5 (ref 5.0–8.0)

## 2015-08-26 ENCOUNTER — Other Ambulatory Visit: Payer: Self-pay | Admitting: Family Medicine

## 2015-10-10 ENCOUNTER — Telehealth: Payer: Self-pay

## 2015-10-10 DIAGNOSIS — Z1231 Encounter for screening mammogram for malignant neoplasm of breast: Secondary | ICD-10-CM

## 2015-10-10 NOTE — Telephone Encounter (Signed)
Orders placed with preference of Fridays

## 2015-10-21 ENCOUNTER — Ambulatory Visit (HOSPITAL_COMMUNITY)
Admission: RE | Admit: 2015-10-21 | Discharge: 2015-10-21 | Disposition: A | Discharge status: Home / Self Care | Payer: Managed Care, Other (non HMO) | Source: Ambulatory Visit | Attending: Family Medicine | Admitting: Family Medicine

## 2015-10-21 DIAGNOSIS — Z1231 Encounter for screening mammogram for malignant neoplasm of breast: Secondary | ICD-10-CM | POA: Insufficient documentation

## 2015-11-29 ENCOUNTER — Ambulatory Visit (INDEPENDENT_AMBULATORY_CARE_PROVIDER_SITE_OTHER): Payer: Managed Care, Other (non HMO) | Admitting: Internal Medicine

## 2015-11-30 ENCOUNTER — Encounter (INDEPENDENT_AMBULATORY_CARE_PROVIDER_SITE_OTHER): Payer: Self-pay | Admitting: Internal Medicine

## 2015-12-03 ENCOUNTER — Other Ambulatory Visit: Payer: Self-pay | Admitting: Family Medicine

## 2015-12-05 ENCOUNTER — Ambulatory Visit: Payer: Managed Care, Other (non HMO) | Admitting: Family Medicine

## 2015-12-09 ENCOUNTER — Other Ambulatory Visit: Payer: Self-pay | Admitting: Family Medicine

## 2015-12-12 ENCOUNTER — Other Ambulatory Visit: Payer: Self-pay

## 2015-12-12 MED ORDER — PRAVASTATIN SODIUM 40 MG PO TABS: 40.0000 mg | ORAL_TABLET | Freq: Every day | ORAL | Status: DC

## 2015-12-23 ENCOUNTER — Other Ambulatory Visit (INDEPENDENT_AMBULATORY_CARE_PROVIDER_SITE_OTHER): Payer: Self-pay | Admitting: Internal Medicine

## 2015-12-23 ENCOUNTER — Other Ambulatory Visit (INDEPENDENT_AMBULATORY_CARE_PROVIDER_SITE_OTHER): Payer: Self-pay | Admitting: *Deleted

## 2015-12-23 ENCOUNTER — Ambulatory Visit (INDEPENDENT_AMBULATORY_CARE_PROVIDER_SITE_OTHER): Payer: Managed Care, Other (non HMO) | Admitting: Internal Medicine

## 2015-12-23 ENCOUNTER — Encounter (INDEPENDENT_AMBULATORY_CARE_PROVIDER_SITE_OTHER): Payer: Self-pay | Admitting: Internal Medicine

## 2015-12-23 ENCOUNTER — Encounter (INDEPENDENT_AMBULATORY_CARE_PROVIDER_SITE_OTHER): Payer: Self-pay | Admitting: *Deleted

## 2015-12-23 VITALS — BP 142/72 | HR 64 | Temp 98.1°F | Ht 64.0 in | Wt 137.6 lb

## 2015-12-23 DIAGNOSIS — Z1211 Encounter for screening for malignant neoplasm of colon: Secondary | ICD-10-CM | POA: Diagnosis not present

## 2015-12-23 DIAGNOSIS — R748 Abnormal levels of other serum enzymes: Secondary | ICD-10-CM | POA: Diagnosis not present

## 2015-12-23 MED ORDER — PEG 3350-KCL-NA BICARB-NACL 420 G PO SOLR: 4000.0000 mL | Freq: Once | ORAL | Status: DC

## 2015-12-23 NOTE — Patient Instructions (Signed)
The risks and benefits such as perforation, bleeding, and infection were reviewed with the patient and is agreeable. 

## 2015-12-23 NOTE — Telephone Encounter (Signed)
Patient needs trilyte 

## 2015-12-23 NOTE — Progress Notes (Signed)
Subjective:    Patient ID: Tanya [name], female    DOB: [birth year], 65 y.o.   MRN: [id number]  HPI Here today for f/u. She was last seen in August of this year for elevated liver enzymes. No hx of IV drug use or etoh abuse. No tatoos.  Transaminases in August and November were normal. She underwent an Elastrography which revealed F0-F1. She has no stigmata of liver disease.  Patient is o Pravastatin for cholesterol.  No prior hx of jaundice.  05/16/2015 Acute Hepatitis Panel negative. 09/25/2012 Acute Hepatitis panel negative. ANA, SMA negative.   She tells me she is good. Her appetite is good. No weight loss.  She usually has a BM daily, but sometimes she is constipated. No melena or BRRB.  Her last colonoscopy was over 10 yrs ago by Dr. Irving Shows and was normal.   11/26/2014 [place] RUQ: elevated liver enzymes:  IMPRESSION: No evidence of gallstones or biliary dilatation.  Hepatic steatosis/diffuse hepatocellular disease. Consider hepatic elastography ultrasound for further evaluation to assess for risk of hepatic fibrosis/cirrhosis.    05/02/2015 [place] elastrography: F-F1.  CMP Latest Ref Rng 08/04/2015 05/06/2015 11/13/2014  Glucose 65 - 99 mg/dL 98 - 96  BUN 7 - 25 mg/dL 15 - 14  Creatinine 0.50 - 0.99 mg/dL 0.72 - 0.69  Sodium 135 - 146 mmol/L 139 - 139  Potassium 3.5 - 5.3 mmol/L 3.9 - 3.7  Chloride 98 - 110 mmol/L 101 - 100  CO2 20 - 31 mmol/L 31 - 31  Calcium 8.6 - 10.4 mg/dL 9.7 - 9.9  Total Protein 6.1 - 8.1 g/dL 7.2 7.6 7.7  Total Bilirubin 0.2 - 1.2 mg/dL 0.4 0.4 0.5  Alkaline Phos 33 - 130 U/L 64 50 63  AST 10 - 35 U/L 26 30 42(H)  ALT 6 - 29 U/L 27 26 65(H)       Review of Systems Past Medical History  Diagnosis Date  . Cystitis   . Anhedonia   . Unspecified hypothyroidism   . Hyperlipidemia   . Insomnia   . Hypertension   . Vaginitis   . GERD (gastroesophageal reflux disease)   . Elevated liver enzymes   . Fatty liver     Past Surgical History    Procedure Laterality Date  . Left heart catheterization with coronary angiogram N/A 11/19/2014    Procedure: LEFT HEART CATHETERIZATION WITH CORONARY ANGIOGRAM;  Surgeon: [name], MD;  Location: [facility] CATH [facility];  Service: Cardiovascular;  Laterality: N/A;    No Known Allergies  Current Outpatient Prescriptions on File Prior to Visit  Medication Sig Dispense Refill  . aspirin 81 MG tablet Take 81 mg by mouth daily.    . cetirizine (ZYRTEC) 10 MG tablet Take 1 tablet (10 mg total) by mouth daily. 30 tablet 4  . COLLAGEN PO Take 1 capsule by mouth daily.    . fish oil-omega-3 fatty acids 1000 MG capsule Take 2 g by mouth daily.    . Misc Natural Products (OSTEO BI-FLEX ADV TRIPLE ST PO) Take 1 tablet by mouth daily.    . Multiple Vitamins-Minerals (CENTRUM) tablet Take 1 tablet by mouth daily.    Marland Kitchen omeprazole (PRILOSEC) 20 MG capsule TAKE ONE CAPSULE BY MOUTH ONCE DAILY 90 capsule 3  . Potassium Gluconate 595 MG CAPS Take 595 capsules by mouth daily.    . pravastatin (PRAVACHOL) 40 MG tablet Take 1 tablet (40 mg total) by mouth daily. 90 tablet 0  . triamterene-hydrochlorothiazide (MAXZIDE-25)  37.5-25 MG tablet TAKE ONE TABLET BY MOUTH ONCE DAILY 30 tablet 3  . vitamin C (ASCORBIC ACID) 500 MG tablet Take 500 mg by mouth daily.     No current facility-administered medications on file prior to visit.        Objective:   Physical Exam Blood pressure 142/72, pulse 64, temperature 98.1 F (36.7 C), height 5\' 4"  (1.626 m), weight 137 lb 9.6 oz (62.415 kg).  Alert and oriented. Skin warm and dry. Oral mucosa is moist.   . Sclera anicteric, conjunctivae is pink. Thyroid not enlarged. No cervical lymphadenopathy. Lungs clear. Heart regular rate and rhythm.  Abdomen is soft. Bowel sounds are positive. No hepatomegaly. No abdominal masses felt. No tenderness.  No edema to lower extremities.         Assessment & Plan:  Elevated liver enzymes: Numbers are normal. [place] Elast F0-F1.Will repeat  liver enzymes. Screening colonoscopy: The risks and benefits such as perforation, bleeding, and infection were reviewed with the patient and is agreeable. Last colonoscopy greater than 10 yrs ago and was normal.

## 2015-12-24 LAB — HEPATIC FUNCTION PANEL
ALBUMIN: 4.4 g/dL (ref 3.6–5.1)
ALK PHOS: 47 U/L (ref 33–130)
ALT: 20 U/L (ref 6–29)
AST: 25 U/L (ref 10–35)
BILIRUBIN INDIRECT: 0.3 mg/dL (ref 0.2–1.2)
BILIRUBIN TOTAL: 0.4 mg/dL (ref 0.2–1.2)
Bilirubin, Direct: 0.1 mg/dL (ref <=0.2)
TOTAL PROTEIN: 7.4 g/dL (ref 6.1–8.1)

## 2016-01-24 ENCOUNTER — Telehealth: Payer: Self-pay

## 2016-01-24 NOTE — Telephone Encounter (Signed)
Advised patient to seek care at urgent care due to possible fever and need for evaluation

## 2016-02-08 ENCOUNTER — Ambulatory Visit (HOSPITAL_COMMUNITY)
Admission: RE | Admit: 2016-02-08 | Discharge: 2016-02-08 | Disposition: A | Discharge status: Home / Self Care | Payer: Managed Care, Other (non HMO) | Source: Ambulatory Visit | Attending: Internal Medicine | Admitting: Internal Medicine

## 2016-02-08 ENCOUNTER — Encounter (HOSPITAL_COMMUNITY): Payer: Self-pay | Admitting: *Deleted

## 2016-02-08 ENCOUNTER — Encounter (HOSPITAL_COMMUNITY)
Admission: RE | Disposition: A | Discharge status: Home / Self Care | Payer: Self-pay | Source: Ambulatory Visit | Attending: Internal Medicine

## 2016-02-08 DIAGNOSIS — E785 Hyperlipidemia, unspecified: Secondary | ICD-10-CM | POA: Diagnosis not present

## 2016-02-08 DIAGNOSIS — Z1211 Encounter for screening for malignant neoplasm of colon: Secondary | ICD-10-CM | POA: Diagnosis not present

## 2016-02-08 DIAGNOSIS — K644 Residual hemorrhoidal skin tags: Secondary | ICD-10-CM | POA: Insufficient documentation

## 2016-02-08 DIAGNOSIS — D120 Benign neoplasm of cecum: Secondary | ICD-10-CM | POA: Diagnosis not present

## 2016-02-08 DIAGNOSIS — Z7982 Long term (current) use of aspirin: Secondary | ICD-10-CM | POA: Diagnosis not present

## 2016-02-08 DIAGNOSIS — Z79899 Other long term (current) drug therapy: Secondary | ICD-10-CM | POA: Insufficient documentation

## 2016-02-08 DIAGNOSIS — E039 Hypothyroidism, unspecified: Secondary | ICD-10-CM | POA: Diagnosis not present

## 2016-02-08 DIAGNOSIS — I10 Essential (primary) hypertension: Secondary | ICD-10-CM | POA: Insufficient documentation

## 2016-02-08 DIAGNOSIS — K219 Gastro-esophageal reflux disease without esophagitis: Secondary | ICD-10-CM | POA: Insufficient documentation

## 2016-02-08 HISTORY — PX: COLONOSCOPY: SHX5424

## 2016-02-08 SURGERY — COLONOSCOPY
Anesthesia: Moderate Sedation

## 2016-02-08 MED ORDER — SODIUM CHLORIDE 0.9 % IV SOLN
INTRAVENOUS | Status: DC
  Administered 2016-02-08: 1000 mL via INTRAVENOUS

## 2016-02-08 MED ORDER — MIDAZOLAM HCL 5 MG/5ML IJ SOLN
INTRAMUSCULAR | Status: DC | PRN
  Administered 2016-02-08 (×3): 2 mg via INTRAVENOUS

## 2016-02-08 MED ORDER — MEPERIDINE HCL 50 MG/ML IJ SOLN
INTRAMUSCULAR | Status: AC
  Filled 2016-02-08: qty 1

## 2016-02-08 MED ORDER — STERILE WATER FOR IRRIGATION IR SOLN
Status: DC | PRN
  Administered 2016-02-08: 12:00:00

## 2016-02-08 MED ORDER — MEPERIDINE HCL 50 MG/ML IJ SOLN
INTRAMUSCULAR | Status: DC | PRN
  Administered 2016-02-08 (×2): 25 mg

## 2016-02-08 MED ORDER — LIDOCAINE HCL 2 % EX GEL
CUTANEOUS | Status: DC
Start: 2016-02-08 — End: 2016-02-08
  Filled 2016-02-08: qty 30

## 2016-02-08 MED ORDER — MIDAZOLAM HCL 5 MG/5ML IJ SOLN
INTRAMUSCULAR | Status: AC
  Filled 2016-02-08: qty 10

## 2016-02-08 NOTE — Discharge Instructions (Signed)
Resume aspirin on 5 /11//2017. Resume other medications and diet as before. No driving for 24 hours. Physician will call with biopsy results.  Colonoscopy, Care After These instructions give you information on caring for yourself after your procedure. Your doctor may also give you more specific instructions. Call your doctor if you have any problems or questions after your procedure. HOME CARE  Do not drive for 24 hours.  Do not sign important papers or use machinery for 24 hours.  You may shower.  You may go back to your usual activities, but go slower for the first 24 hours.  Take rest breaks often during the first 24 hours.  Walk around or use warm packs on your belly (abdomen) if you have belly cramping or gas.  Drink enough fluids to keep your pee (urine) clear or pale yellow.  Resume your normal diet. Avoid heavy or fried foods.  Avoid drinking alcohol for 24 hours or as told by your doctor.  Only take medicines as told by your doctor. If a tissue sample (biopsy) was taken during the procedure:   Do not take aspirin or blood thinners for 7 days, or as told by your doctor.  Do not drink alcohol for 7 days, or as told by your doctor.  Eat soft foods for the first 24 hours. GET HELP IF: You still have a small amount of blood in your poop (stool) 2-3 days after the procedure. GET HELP RIGHT AWAY IF:  You have more than a small amount of blood in your poop.  You see clumps of tissue (blood clots) in your poop.  Your belly is puffy (swollen).  You feel sick to your stomach (nauseous) or throw up (vomit).  You have a fever.  You have belly pain that gets worse and medicine does not help. MAKE SURE YOU:  Understand these instructions.  Will watch your condition.  Will get help right away if you are not doing well or get worse.   This information is not intended to replace advice given to you by your health care provider. Make sure you discuss any questions you  have with your health care provider.   Document Released: 10/20/2010 Document Revised: 09/22/2013 Document Reviewed: 05/25/2013 Elsevier Interactive Patient Education 2016 [employer].   Hemorrhoids Hemorrhoids are swollen veins around the rectum or anus. There are two types of hemorrhoids:   Internal hemorrhoids. These occur in the veins just inside the rectum. They may poke through to the outside and become irritated and painful.  External hemorrhoids. These occur in the veins outside the anus and can be felt as a painful swelling or hard lump near the anus. CAUSES  Pregnancy.   Obesity.   Constipation or diarrhea.   Straining to have a bowel movement.   Sitting for long periods on the toilet.  Heavy lifting or other activity that caused you to strain.  Anal intercourse. SYMPTOMS   Pain.   Anal itching or irritation.   Rectal bleeding.   Fecal leakage.   Anal swelling.   One or more lumps around the anus.  DIAGNOSIS  Your caregiver may be able to diagnose hemorrhoids by visual examination. Other examinations or tests that may be performed include:   Examination of the rectal area with a gloved hand (digital rectal exam).   Examination of anal canal using a small tube (scope).   A blood test if you have lost a significant amount of blood.  A test to look inside the colon (  sigmoidoscopy or colonoscopy). TREATMENT Most hemorrhoids can be treated at home. However, if symptoms do not seem to be getting better or if you have a lot of rectal bleeding, your caregiver may perform a procedure to help make the hemorrhoids get smaller or remove them completely. Possible treatments include:   Placing a rubber band at the base of the hemorrhoid to cut off the circulation (rubber band ligation).   Injecting a chemical to shrink the hemorrhoid (sclerotherapy).   Using a tool to burn the hemorrhoid (infrared light therapy).   Surgically removing the  hemorrhoid (hemorrhoidectomy).   Stapling the hemorrhoid to block blood flow to the tissue (hemorrhoid stapling).  HOME CARE INSTRUCTIONS   Eat foods with fiber, such as whole grains, beans, nuts, fruits, and vegetables. Ask your doctor about taking products with added fiber in them (fibersupplements).  Increase fluid intake. Drink enough water and fluids to keep your urine clear or pale yellow.   Exercise regularly.   Go to the bathroom when you have the urge to have a bowel movement. Do not wait.   Avoid straining to have bowel movements.   Keep the anal area dry and clean. Use wet toilet paper or moist towelettes after a bowel movement.   Medicated creams and suppositories may be used or applied as directed.   Only take over-the-counter or prescription medicines as directed by your caregiver.   Take warm sitz baths for 15-20 minutes, 3-4 times a day to ease pain and discomfort.   Place ice packs on the hemorrhoids if they are tender and swollen. Using ice packs between sitz baths may be helpful.   Put ice in a plastic bag.   Place a towel between your skin and the bag.   Leave the ice on for 15-20 minutes, 3-4 times a day.   Do not use a donut-shaped pillow or sit on the toilet for long periods. This increases blood pooling and pain.  SEEK MEDICAL CARE IF:  You have increasing pain and swelling that is not controlled by treatment or medicine.  You have uncontrolled bleeding.  You have difficulty or you are unable to have a bowel movement.  You have pain or inflammation outside the area of the hemorrhoids. MAKE SURE YOU:  Understand these instructions.  Will watch your condition.  Will get help right away if you are not doing well or get worse.   This information is not intended to replace advice given to you by your health care provider. Make sure you discuss any questions you have with your health care provider.   Document Released: 09/14/2000  Document Revised: 09/03/2012 Document Reviewed: 07/22/2012 Elsevier Interactive Patient Education 2016 Elsevier Inc.   Colon Polyps Polyps are lumps of extra tissue growing inside the body. Polyps can grow in the large intestine (colon). Most colon polyps are noncancerous (benign). However, some colon polyps can become cancerous over time. Polyps that are larger than a pea may be harmful. To be safe, caregivers remove and test all polyps. CAUSES  Polyps form when mutations in the genes cause your cells to grow and divide even though no more tissue is needed. RISK FACTORS There are a number of risk factors that can increase your chances of getting colon polyps. They include:  Being older than 50 years.  Family history of colon polyps or colon cancer.  Long-term colon diseases, such as colitis or Crohn disease.  Being overweight.  Smoking.  Being inactive.  Drinking too much alcohol. SYMPTOMS  Most small polyps do not cause symptoms. If symptoms are present, they may include:  Blood in the stool. The stool may look dark red or black.  Constipation or diarrhea that lasts longer than 1 week. DIAGNOSIS People often do not know they have polyps until their caregiver finds them during a regular checkup. Your caregiver can use 4 tests to check for polyps:  Digital rectal exam. The caregiver wears gloves and feels inside the rectum. This test would find polyps only in the rectum.  Barium enema. The caregiver puts a liquid called barium into your rectum before taking X-rays of your colon. Barium makes your colon look white. Polyps are dark, so they are easy to see in the X-ray pictures.  Sigmoidoscopy. A thin, flexible tube (sigmoidoscope) is placed into your rectum. The sigmoidoscope has a light and tiny camera in it. The caregiver uses the sigmoidoscope to look at the last third of your colon.  Colonoscopy. This test is like sigmoidoscopy, but the caregiver looks at the entire colon.  This is the most common method for finding and removing polyps. TREATMENT  Any polyps will be removed during a sigmoidoscopy or colonoscopy. The polyps are then tested for cancer. PREVENTION  To help lower your risk of getting more colon polyps:  Eat plenty of fruits and vegetables. Avoid eating fatty foods.  Do not smoke.  Avoid drinking alcohol.  Exercise every day.  Lose weight if recommended by your caregiver.  Eat plenty of calcium and folate. Foods that are rich in calcium include milk, cheese, and broccoli. Foods that are rich in folate include chickpeas, kidney beans, and spinach. HOME CARE INSTRUCTIONS Keep all follow-up appointments as directed by your caregiver. You may need periodic exams to check for polyps. SEEK MEDICAL CARE IF: You notice bleeding during a bowel movement.   This information is not intended to replace advice given to you by your health care provider. Make sure you discuss any questions you have with your health care provider.   Document Released: 06/13/2004 Document Revised: 10/08/2014 Document Reviewed: 11/27/2011 Elsevier Interactive Patient Education [employer].

## 2016-02-08 NOTE — H&P (Signed)
Tanya [name] is an 65 y.o. female.   Chief Complaint: Patient is here for colonoscopy. HPI: Patient is 65 year old female who is here for screening colonoscopy. She denies abdominal pain rectal bleeding or recent change in her bowel habits. She has occasional constipation when she does not eat vegetables. Last colonoscopy was 10 years ago. Family history is negative for CRC.  Past Medical History  Diagnosis Date  . Cystitis   . Anhedonia   . Unspecified hypothyroidism   . Hyperlipidemia   . Insomnia   . Hypertension   . Vaginitis   . GERD (gastroesophageal reflux disease)   . Elevated liver enzymes   . Fatty liver     Past Surgical History  Procedure Laterality Date  . Left heart catheterization with coronary angiogram N/A 11/19/2014    Procedure: LEFT HEART CATHETERIZATION WITH CORONARY ANGIOGRAM;  Surgeon: [name], MD;  Location: [facility] CATH [facility];  Service: Cardiovascular;  Laterality: N/A;    Family History  Problem Relation Age of Onset  . Dementia Mother   . Heart disease Mother     heart attack   . Hypertension Mother   . Heart attack Father   . Heart disease Father     heart attack   . Hypertension Father    Social History:  reports that she has never smoked. She has never used smokeless tobacco. She reports that she does not drink alcohol or use illicit drugs.  Allergies: No Known Allergies  Medications Prior to Admission  Medication Sig Dispense Refill  . aspirin 81 MG tablet Take 81 mg by mouth daily.    Marland Kitchen azithromycin (ZITHROMAX) 250 MG tablet Take 250 mg by mouth daily.    . cetirizine (ZYRTEC) 10 MG tablet Take 1 tablet (10 mg total) by mouth daily. 30 tablet 4  . fish oil-omega-3 fatty acids 1000 MG capsule Take 1 g by mouth daily.     . Garlic 123XX123 MG CAPS Take 1 capsule by mouth daily.    . Multiple Vitamins-Minerals (CENTRUM) tablet Take 1 tablet by mouth daily.    Marland Kitchen omeprazole (PRILOSEC) 20 MG capsule TAKE ONE CAPSULE BY MOUTH ONCE DAILY 90  capsule 3  . polyethylene glycol-electrolytes (NULYTELY/GOLYTELY) 420 g solution Take 4,000 mLs by mouth once. 4000 mL 0  . Potassium Gluconate 595 MG CAPS Take 595 capsules by mouth daily.    . pravastatin (PRAVACHOL) 40 MG tablet Take 1 tablet (40 mg total) by mouth daily. 90 tablet 0  . triamterene-hydrochlorothiazide (MAXZIDE-25) 37.5-25 MG tablet TAKE ONE TABLET BY MOUTH ONCE DAILY 30 tablet 3  . vitamin C (ASCORBIC ACID) 500 MG tablet Take 500 mg by mouth daily.    Marland Kitchen VITAMIN E PO Take 1 tablet by mouth daily.    . Misc Natural Products (OSTEO BI-FLEX ADV TRIPLE ST PO) Take 1 tablet by mouth daily.    . predniSONE (DELTASONE) 5 MG tablet Take 5 mg by mouth 2 (two) times daily with a meal.      No results found for this or any previous visit (from the past 48 hour(s)). No results found.  ROS  Blood pressure 134/73, pulse 62, temperature 98.1 F (36.7 C), temperature source Oral, resp. rate 12, height 5\' 4"  (1.626 m), weight 135 lb (61.236 kg), SpO2 96 %. Physical Exam  Constitutional: She appears well-developed and well-nourished.  HENT:  Mouth/Throat: Oropharynx is clear and moist.  Eyes: Conjunctivae are normal. No scleral icterus.  Neck: No thyromegaly present.  Cardiovascular: Normal  rate, regular rhythm and normal heart sounds.   No murmur heard. Respiratory: Effort normal and breath sounds normal.  GI: Soft. She exhibits no distension and no mass. There is no tenderness.  Musculoskeletal: She exhibits no edema.  Lymphadenopathy:    She has no cervical adenopathy.  Neurological: She is alert.  Skin: Skin is warm and dry.     Assessment/Plan Average risk screening colonoscopy.  [name], MD 02/08/2016, 11:32 AM

## 2016-02-08 NOTE — Op Note (Signed)
[facility] Patient Name: Tanya [name] Procedure Date: 02/08/2016 11:25 AM MRN: [id number] Date of Birth: [birth year] Attending MD: Hildred Laser , MD CSN: [id number] Age: 65 Admit Type: Outpatient Procedure:                Colonoscopy Indications:              Screening for colorectal malignant neoplasm Providers:                Hildred Laser, MD, Janeece Riggers, RN, Isabella Stalling,                            Technician Referring MD:             Norwood Levo. Moshe Cipro, MD Medicines:                Meperidine 50 mg IV, Midazolam 6 mg IV Complications:            No immediate complications. Estimated Blood Loss:     Estimated blood loss was minimal. Estimated blood                            loss was minimal. Procedure:                Pre-Anesthesia Assessment:                           - Prior to the procedure, a History and Physical                            was performed, and patient medications and                            allergies were reviewed. The patient's tolerance of                            previous anesthesia was also reviewed. The risks                            and benefits of the procedure and the sedation                            options and risks were discussed with the patient.                            All questions were answered, and informed consent                            was obtained. Prior Anticoagulants: The patient                            last took aspirin 2 days prior to the procedure.                            ASA Grade Assessment: II - A patient with mild  systemic disease. After reviewing the risks and                            benefits, the patient was deemed in satisfactory                            condition to undergo the procedure.                           After obtaining informed consent, the colonoscope                            was passed under direct vision. Throughout the   procedure, the patient's blood pressure, pulse, and                            oxygen saturations were monitored continuously. The                            [id number]) scope was introduced through                            the anus and advanced to the the cecum, identified                            by appendiceal orifice and ileocecal valve. The                            colonoscopy was performed without difficulty. The                            patient tolerated the procedure well. The quality                            of the bowel preparation was adequate. The                            ileocecal valve, appendiceal orifice, and rectum                            were photographed. The ileocecal valve, the                            appendiceal orifice and the rectum were                            photographed. Scope In: 11:44:13 AM Scope Out: 12:07:55 PM Scope Withdrawal Time: 0 hours 11 minutes 40 seconds  Total Procedure Duration: 0 hours 23 minutes 42 seconds  Findings:      A 4 mm polyp was found in the cecum. The polyp was sessile. The polyp       was removed with a cold snare. Resection and retrieval were complete.      External hemorrhoids were found during retroflexion. The hemorrhoids       were small. Impression:               -  One 4 mm polyp in the cecum, removed with a cold                            snare. Resected and retrieved.                           - External hemorrhoids. Moderate Sedation:      Moderate (conscious) sedation was administered by the endoscopy nurse       and supervised by the endoscopist. The following parameters were       monitored: oxygen saturation, heart rate, blood pressure, CO2       capnography and response to care. Total physician intraservice time was       30 minutes. Recommendation:           - Patient has a contact number available for                            emergencies. The signs and symptoms of potential                             delayed complications were discussed with the                            patient. Return to normal activities tomorrow.                            Written discharge instructions were provided to the                            patient.                           - Resume previous diet today.                           - Continue present medications.                           - Resume aspirin at prior dose tomorrow.                           - Repeat colonoscopy for surveillance based on                            pathology results. Procedure Code(s):        --- Professional ---                           [id number], Colonoscopy, flexible; with removal of                            tumor(s), polyp(s), or other lesion(s) by snare                            technique  [id number], Moderate sedation services provided by the                            same physician or other qualified health care                            professional performing the diagnostic or                            therapeutic service that the sedation supports,                            requiring the presence of an independent trained                            observer to assist in the monitoring of the                            patient's level of consciousness and physiological                            status; initial 15 minutes of intraservice time,                            patient age 73 years or older                           [phone], Moderate sedation services; each additional                            15 minutes intraservice time Diagnosis Code(s):        --- Professional ---                           Z12.11, Encounter for screening for malignant                            neoplasm of colon                           D12.0, Benign neoplasm of cecum                           K64.4, Residual hemorrhoidal skin tags CPT copyright 2016 [employer]. All rights  reserved. The codes documented in this report are preliminary and upon coder review may  be revised to meet current compliance requirements. Hildred Laser, MD Hildred Laser, MD 02/08/2016 12:16:14 PM This report has been signed electronically. Number of Addenda: 0

## 2016-02-09 ENCOUNTER — Encounter (HOSPITAL_COMMUNITY): Payer: Self-pay | Admitting: Internal Medicine

## 2016-02-09 ENCOUNTER — Other Ambulatory Visit: Payer: Self-pay | Admitting: Family Medicine

## 2016-02-11 ENCOUNTER — Encounter: Payer: Self-pay | Admitting: Family Medicine

## 2016-02-11 DIAGNOSIS — D126 Benign neoplasm of colon, unspecified: Secondary | ICD-10-CM | POA: Insufficient documentation

## 2016-03-30 ENCOUNTER — Encounter: Payer: Self-pay | Admitting: Family Medicine

## 2016-03-30 ENCOUNTER — Ambulatory Visit (INDEPENDENT_AMBULATORY_CARE_PROVIDER_SITE_OTHER): Payer: Managed Care, Other (non HMO) | Admitting: Family Medicine

## 2016-03-30 VITALS — BP 132/78 | HR 66 | Resp 16 | Ht 64.0 in | Wt 134.0 lb

## 2016-03-30 DIAGNOSIS — Z0000 Encounter for general adult medical examination without abnormal findings: Secondary | ICD-10-CM | POA: Insufficient documentation

## 2016-03-30 DIAGNOSIS — M5431 Sciatica, right side: Secondary | ICD-10-CM | POA: Diagnosis not present

## 2016-03-30 DIAGNOSIS — F329 Major depressive disorder, single episode, unspecified: Secondary | ICD-10-CM

## 2016-03-30 DIAGNOSIS — E785 Hyperlipidemia, unspecified: Secondary | ICD-10-CM

## 2016-03-30 DIAGNOSIS — Z1211 Encounter for screening for malignant neoplasm of colon: Secondary | ICD-10-CM | POA: Diagnosis not present

## 2016-03-30 DIAGNOSIS — Z23 Encounter for immunization: Secondary | ICD-10-CM

## 2016-03-30 DIAGNOSIS — E559 Vitamin D deficiency, unspecified: Secondary | ICD-10-CM

## 2016-03-30 DIAGNOSIS — F32A Depression, unspecified: Secondary | ICD-10-CM | POA: Insufficient documentation

## 2016-03-30 DIAGNOSIS — R7301 Impaired fasting glucose: Secondary | ICD-10-CM

## 2016-03-30 DIAGNOSIS — I10 Essential (primary) hypertension: Secondary | ICD-10-CM

## 2016-03-30 LAB — CBC
HCT: 38.9 % (ref 35.0–45.0)
HEMOGLOBIN: 13.2 g/dL (ref 11.7–15.5)
MCH: 31.6 pg (ref 27.0–33.0)
MCHC: 33.9 g/dL (ref 32.0–36.0)
MCV: 93.1 fL (ref 80.0–100.0)
MPV: 8.4 fL (ref 7.5–12.5)
Platelets: 384 10*3/uL (ref 140–400)
RBC: 4.18 MIL/uL (ref 3.80–5.10)
RDW: 14 % (ref 11.0–15.0)
WBC: 5.3 10*3/uL (ref 3.8–10.8)

## 2016-03-30 LAB — COMPLETE METABOLIC PANEL WITH GFR
ALT: 22 U/L (ref 6–29)
AST: 29 U/L (ref 10–35)
Albumin: 4.6 g/dL (ref 3.6–5.1)
Alkaline Phosphatase: 65 U/L (ref 33–130)
BUN: 12 mg/dL (ref 7–25)
CO2: 31 mmol/L (ref 20–31)
Calcium: 10.2 mg/dL (ref 8.6–10.4)
Chloride: 101 mmol/L (ref 98–110)
Creat: 0.69 mg/dL (ref 0.50–0.99)
GFR, Est African American: >89 mL/min (ref >=60)
GLUCOSE: 94 mg/dL (ref 65–99)
Potassium: 3.9 mmol/L (ref 3.5–5.3)
SODIUM: 141 mmol/L (ref 135–146)
Total Bilirubin: 0.3 mg/dL (ref 0.2–1.2)
Total Protein: 7.8 g/dL (ref 6.1–8.1)

## 2016-03-30 LAB — LIPID PANEL
CHOL/HDL RATIO: 2.8 ratio (ref <=5.0)
Cholesterol: 190 mg/dL (ref 125–200)
HDL: 67 mg/dL (ref >=46)
LDL CALC: 97 mg/dL (ref <130)
Triglycerides: 128 mg/dL (ref <150)
VLDL: 26 mg/dL (ref <30)

## 2016-03-30 LAB — POC HEMOCCULT BLD/STL (OFFICE/1-CARD/DIAGNOSTIC): FECAL OCCULT BLD: NEGATIVE

## 2016-03-30 MED ORDER — MIRTAZAPINE 7.5 MG PO TABS: 7.5000 mg | ORAL_TABLET | Freq: Every day | ORAL | Status: DC

## 2016-03-30 MED ORDER — TRIAMTERENE-HCTZ 37.5-25 MG PO TABS: 1.0000 | ORAL_TABLET | Freq: Every day | ORAL | Status: DC

## 2016-03-30 MED ORDER — PREDNISONE 5 MG (21) PO TBPK: 5.0000 mg | ORAL_TABLET | ORAL | Status: DC

## 2016-03-30 MED ORDER — NAPROXEN 375 MG PO TBEC: 1.0000 | DELAYED_RELEASE_TABLET | Freq: Two times a day (BID) | ORAL | Status: AC

## 2016-03-30 NOTE — Assessment & Plan Note (Signed)

## 2016-03-30 NOTE — Patient Instructions (Addendum)
F/u in 8 weeks , call if you need me sooner  Pneumonia vaccine today  You are referred to therapist for counselling to help you to deal with your current situation , also you are started onn medication, remeron one at nigth to help with depression and sleep  Medication is sent for back pain radiating to right buttock , this is sciatica  Labs needed fasting please  Thank you  for choosing [facility]. We consider it a privelige to serve you.  Delivering excellent health care in a caring and  compassionate way is our goal.  Partnering with you,  so that together we can achieve this goal is our strategy.

## 2016-03-30 NOTE — Assessment & Plan Note (Addendum)
3 month history, pain with bending, raduiates to right buttock from low back, short course of anti inflammatory

## 2016-03-30 NOTE — Assessment & Plan Note (Signed)
Start remeron and referred to therapist. Situational due to marital infidelity

## 2016-03-30 NOTE — Assessment & Plan Note (Signed)
After obtaining informed consent, the vaccine is  administered by LPN.  

## 2016-03-30 NOTE — Progress Notes (Signed)
Subjective:    Patient ID: Tanya [name], female    DOB: 1951/03/15, 65 y.o.   MRN: [id number]  HPI Patient is in for annual physical exam. C/o low back pain radiating down right buttock x 3 months.no lower extremity weakness or numbness. No incontinence of stool or urine. C/o depression due to her husband's infidelity for months, poor self esteem , poor concentration , poor appetite, poor sleep, not suicidal or homicidal Immunization is updated C/o vaginal discharge wit odor , wants this checked  Review of Systems See HPI Denies recent fever or chills. Denies sinus pressure, nasal congestion, ear pain or sore throat. Denies chest congestion, productive cough or wheezing. Denies chest pains, palpitations and leg swelling Denies abdominal pain, nausea, vomiting,diarrhea or constipation.   Denies dysuria, frequency, hesitancy or incontinence. Denies skin break down or rash.         Objective:   Physical Exam  BP 132/78 mmHg  Pulse 66  Resp 16  Ht 5\' 4"  (1.626 m)  Wt 134 lb (60.782 kg)  BMI 22.99 kg/m2  SpO2 98% Pleasant well nourished female, alert and oriented x 3, in no cardio-pulmonary distress. Afebrile. HEENT No facial trauma or asymetry. Sinuses non tender.  Extra occullar muscles intact,  External ears normal, tympanic membranes clear. Oropharynx moist, no exudate, good dentition. Neck: supple, no adenopathy,JVD or thyromegaly.No bruits.  Chest: Clear to ascultation bilaterally.No crackles or wheezes. Non tender to palpation  Breast: No asymetry,no masses or lumps. No tenderness. No nipple discharge or inversion. No axillary or supraclavicular adenopathy  Cardiovascular system; Heart sounds normal,  S1 and  S2 ,no S3.  No murmur, or thrill. Apical beat not displaced Peripheral pulses normal.  Abdomen: Soft, non tender, no organomegaly or masses. No bruits. Bowel sounds normal. No guarding, tenderness or rebound.  Rectal:  Normal sphincter  tone. No mass.No rectal masses.  Guaiac negative stool.  GU: External genitalia normal female genitalia , female distribution of hair. No lesions. Urethral meatus normal in size, no  Prolapse, no lesions visibly  Present. Bladder non tender. Vagina pink and moist , with no visible lesions ,white  discharge present . Adequate pelvic support no  cystocele or rectocele noted Cervix pink and appears healthy, no lesions or ulcerations noted, no discharge noted from os Uterus normal size, no adnexal masses, no cervical motion or adnexal tenderness.   Musculoskeletal exam: Decreased  ROM of lumbar  Spine, normal in  hips , shoulders and knees. No deformity ,swelling or crepitus noted. No muscle wasting or atrophy.   Neurologic: Cranial nerves 2 to 12 intact. Power, tone ,sensation and reflexes normal throughout. No disturbance in gait. No tremor.  Skin: Intact, no ulceration, erythema , scaling or rash noted. Pigmentation normal throughout  Psych; Depressed mood and tearful at times      Assessment & Plan:  Annual physical exam Annual exam as documented. Counseling done  re healthy lifestyle involving commitment to 150 minutes exercise per week, heart healthy diet, and attaining healthy weight.The importance of adequate sleep also discussed. Regular seat belt use and home safety, is also discussed. Changes in health habits are decided on by the patient with goals and time frames  set for achieving them. Immunization and cancer screening needs are specifically addressed at this visit.   Need for 23-polyvalent pneumococcal polysaccharide vaccine After obtaining informed consent, the vaccine is  administered by LPN.   Depression Start remeron and referred to therapist. Situational due to marital infidelity  Sciatica of  right side 3 month history, pain with bending, raduiates to right buttock from low back, short course of anti inflammatory

## 2016-03-31 LAB — VITAMIN D 25 HYDROXY (VIT D DEFICIENCY, FRACTURES): VIT D 25 HYDROXY: 40 ng/mL (ref 30–100)

## 2016-04-02 NOTE — Addendum Note (Signed)
Addended by: Tula Nakayama E on: 04/02/2016 01:10 PM   Modules accepted: Miquel Dunn

## 2016-04-09 ENCOUNTER — Encounter (HOSPITAL_COMMUNITY): Payer: Self-pay | Admitting: Emergency Medicine

## 2016-04-09 ENCOUNTER — Emergency Department (HOSPITAL_COMMUNITY)
Admission: EM | Admit: 2016-04-09 | Discharge: 2016-04-09 | Disposition: A | Discharge status: Home / Self Care | Payer: Managed Care, Other (non HMO) | Attending: Emergency Medicine | Admitting: Emergency Medicine

## 2016-04-09 ENCOUNTER — Emergency Department (HOSPITAL_COMMUNITY): Payer: Managed Care, Other (non HMO)

## 2016-04-09 DIAGNOSIS — R079 Chest pain, unspecified: Secondary | ICD-10-CM

## 2016-04-09 DIAGNOSIS — E039 Hypothyroidism, unspecified: Secondary | ICD-10-CM | POA: Diagnosis not present

## 2016-04-09 DIAGNOSIS — I10 Essential (primary) hypertension: Secondary | ICD-10-CM | POA: Diagnosis not present

## 2016-04-09 DIAGNOSIS — Z7982 Long term (current) use of aspirin: Secondary | ICD-10-CM | POA: Insufficient documentation

## 2016-04-09 DIAGNOSIS — Z79899 Other long term (current) drug therapy: Secondary | ICD-10-CM | POA: Diagnosis not present

## 2016-04-09 DIAGNOSIS — E785 Hyperlipidemia, unspecified: Secondary | ICD-10-CM | POA: Diagnosis not present

## 2016-04-09 LAB — CBC WITH DIFFERENTIAL/PLATELET
BASOS ABS: 0.1 10*3/uL (ref 0.0–0.1)
BASOS PCT: 1 %
EOS PCT: 3 %
Eosinophils Absolute: 0.2 10*3/uL (ref 0.0–0.7)
HCT: 39.8 % (ref 36.0–46.0)
Hemoglobin: 13.6 g/dL (ref 12.0–15.0)
Lymphocytes Relative: 38 %
Lymphs Abs: 2.3 10*3/uL (ref 0.7–4.0)
MCH: 31.7 pg (ref 26.0–34.0)
MCHC: 34.2 g/dL (ref 30.0–36.0)
MCV: 92.8 fL (ref 78.0–100.0)
MONO ABS: 0.5 10*3/uL (ref 0.1–1.0)
Monocytes Relative: 8 %
NEUTROS ABS: 3 10*3/uL (ref 1.7–7.7)
Neutrophils Relative %: 50 %
PLATELETS: 346 10*3/uL (ref 150–400)
RBC: 4.29 MIL/uL (ref 3.87–5.11)
RDW: 13.5 % (ref 11.5–15.5)
WBC: 6 10*3/uL (ref 4.0–10.5)

## 2016-04-09 LAB — COMPREHENSIVE METABOLIC PANEL
ALBUMIN: 4.5 g/dL (ref 3.5–5.0)
ALT: 25 U/L (ref 14–54)
AST: 32 U/L (ref 15–41)
Alkaline Phosphatase: 67 U/L (ref 38–126)
Anion gap: 10 (ref 5–15)
BUN: 17 mg/dL (ref 6–20)
CHLORIDE: 100 mmol/L — AB (ref 101–111)
CO2: 29 mmol/L (ref 22–32)
Calcium: 10.5 mg/dL — ABNORMAL HIGH (ref 8.9–10.3)
Creatinine, Ser: 0.59 mg/dL (ref 0.44–1.00)
GFR calc Af Amer: >60 mL/min (ref >60)
GFR calc non Af Amer: >60 mL/min (ref >60)
GLUCOSE: 88 mg/dL (ref 65–99)
POTASSIUM: 3.1 mmol/L — AB (ref 3.5–5.1)
SODIUM: 139 mmol/L (ref 135–145)
Total Bilirubin: 0.8 mg/dL (ref 0.3–1.2)
Total Protein: 8.6 g/dL — ABNORMAL HIGH (ref 6.5–8.1)

## 2016-04-09 LAB — TROPONIN I: Troponin I: <0.03 ng/mL (ref <0.03)

## 2016-04-09 LAB — D-DIMER, QUANTITATIVE (NOT AT ARMC): D DIMER QUANT: 0.45 ug{FEU}/mL (ref 0.00–0.50)

## 2016-04-09 LAB — LIPASE, BLOOD: Lipase: 41 U/L (ref 11–51)

## 2016-04-09 NOTE — ED Notes (Signed)
Pt reports intermittent right side chest pain with n/sob/dizziness x 1 week. Pt reports she did a different job at work x 1 week ago and thinks it is possible her pain is muscular but she is unsure.

## 2016-04-09 NOTE — ED Notes (Signed)
Pt states she went to the beach a week ago and began having chest pain while she was gone.  States intermittent shortness of breath as well.

## 2016-04-09 NOTE — ED Provider Notes (Signed)
CSN: [id number]     Arrival date & time 04/09/16  [id number] History   First MD Initiated Contact with Patient 04/09/16 [phone]     Chief Complaint  Patient presents with  . Chest Pain     (Consider location/radiation/quality/duration/timing/severity/associated sxs/prior Treatment) Patient is a 65 y.o. female presenting with chest pain. The history is provided by the patient. No language interpreter was used.  Chest Pain Pain location:  R chest Pain quality: aching   Pain radiates to:  Does not radiate Pain radiates to the back: no   Pain severity:  Moderate Onset quality:  Gradual Timing:  Constant Progression:  Worsening Chronicity:  New Context: movement   Relieved by:  Nothing Worsened by:  Nothing tried Ineffective treatments:  None tried Associated symptoms: no numbness   Risk factors: high cholesterol and hypertension   Pt complains of pain in the right side of her chest.  Pt traveled to the beach last week.  Pt reports she has been working this week and lifting heavier material.   Pt reports pain feels like she has strained muscles lifting.  Past Medical History  Diagnosis Date  . Cystitis   . Anhedonia   . Unspecified hypothyroidism   . Hyperlipidemia   . Insomnia   . Hypertension   . Vaginitis   . GERD (gastroesophageal reflux disease)   . Elevated liver enzymes   . Fatty liver    Past Surgical History  Procedure Laterality Date  . Left heart catheterization with coronary angiogram N/A 11/19/2014    Procedure: LEFT HEART CATHETERIZATION WITH CORONARY ANGIOGRAM;  Surgeon: [name], MD;  Location: [facility] CATH LAB;  Service: Cardiovascular;  Laterality: N/A;  . Colonoscopy N/A 02/08/2016    Procedure: COLONOSCOPY;  Surgeon: [name], MD;  Location: [facility] ENDO SUITE;  Service: Endoscopy;  Laterality: N/A;  1200 - moved to 11:45 - Ann to notify   Family History  Problem Relation Age of Onset  . Dementia Mother   . Heart disease Mother     heart attack   .  Hypertension Mother   . Heart attack Father   . Heart disease Father     heart attack   . Hypertension Father    Social History  Substance Use Topics  . Smoking status: Never Smoker   . Smokeless tobacco: Never Used  . Alcohol Use: No   OB History    No data available     Review of Systems  Cardiovascular: Positive for chest pain.  Neurological: Negative for numbness.  All other systems reviewed and are negative.     Allergies  Review of patient's allergies indicates no known allergies.  Home Medications   Prior to Admission medications   Medication Sig Start Date End Date Taking? Authorizing Provider  aspirin 81 MG tablet Take 81 mg by mouth daily.   Yes Historical Provider, MD  fish oil-omega-3 fatty acids 1000 MG capsule Take 1 g by mouth daily.    Yes Historical Provider, MD  Garlic 123XX123 MG CAPS Take 1 capsule by mouth daily.   Yes Historical Provider, MD  Multiple Vitamins-Minerals (CENTRUM) tablet Take 1 tablet by mouth daily.   Yes Historical Provider, MD  omeprazole (PRILOSEC) 20 MG capsule TAKE ONE CAPSULE BY MOUTH ONCE DAILY 02/10/16  Yes [name], MD  Potassium Gluconate 595 MG CAPS Take 595 capsules by mouth daily.   Yes Historical Provider, MD  pravastatin (PRAVACHOL) 40 MG tablet Take 1 tablet (40 mg total)  by mouth daily. 12/12/15  Yes [name], MD  triamterene-hydrochlorothiazide (MAXZIDE-25) 37.5-25 MG tablet Take 1 tablet by mouth daily. 03/30/16  Yes [name], MD  vitamin C (ASCORBIC ACID) 500 MG tablet Take 500 mg by mouth daily.   Yes Historical Provider, MD  VITAMIN E PO Take 1 tablet by mouth daily.   Yes Historical Provider, MD  mirtazapine (REMERON) 7.5 MG tablet Take 1 tablet (7.5 mg total) by mouth at bedtime. Patient not taking: Reported on 04/09/2016 03/30/16 07/30/16  [name], MD  Naproxen 375 MG TBEC Take 1 tablet (375 mg total) by mouth 2 (two) times daily. Patient not taking: Reported on 04/09/2016 03/30/16  05/02/16  [name], MD  predniSONE (STERAPRED UNI-PAK 21 TAB) 5 MG (21) TBPK tablet Take 1 tablet (5 mg total) by mouth as directed. Use as directed Patient not taking: Reported on 04/09/2016 03/30/16   [name], MD   BP 147/78 mmHg  Pulse 58  Temp(Src) 98.3 F (36.8 C) (Oral)  Resp 15  Ht 5\' 4"  (1.626 m)  Wt 58.968 kg  BMI 22.30 kg/m2  SpO2 98% Physical Exam  Constitutional: She is oriented to person, place, and time. She appears well-developed and well-nourished.  HENT:  Head: Normocephalic and atraumatic.  Right Ear: External ear normal.  Left Ear: External ear normal.  Nose: Nose normal.  Mouth/Throat: Oropharynx is clear and moist.  Eyes: Conjunctivae are normal. Pupils are equal, round, and reactive to light.  Neck: Normal range of motion.  Cardiovascular: Normal rate.   Pulmonary/Chest: Effort normal.  Abdominal: Soft.  Musculoskeletal: Normal range of motion.  Neurological: She is alert and oriented to person, place, and time. She has normal reflexes.  Skin: Skin is warm.  Nursing note and vitals reviewed.   ED Course  Procedures (including critical care time) Labs Review Labs Reviewed  COMPREHENSIVE METABOLIC PANEL - Abnormal; Notable for the following:    Potassium 3.1 (*)    Chloride 100 (*)    Calcium 10.5 (*)    Total Protein 8.6 (*)    All other components within normal limits  TROPONIN I  D-DIMER, QUANTITATIVE (NOT AT [facility])  CBC WITH DIFFERENTIAL/PLATELET  LIPASE, BLOOD  TROPONIN I    Imaging Review Dg Chest 2 View  04/09/2016  CLINICAL DATA:  Shortness of breath, chronic.  Chest pain. EXAM: CHEST  2 VIEW COMPARISON:  January 19, 2014 chest radiograph and chest CT November 05, 2014 FINDINGS: There is no edema or consolidation. Heart size and pulmonary vascularity are normal. No adenopathy. There is atherosclerotic calcification in the aortic arch. No bone lesions are evident. IMPRESSION: Aortic atherosclerosis.  No edema or consolidation.  Electronically Signed   By: Lowella Grip III M.D.   On: 04/09/2016 10:34   I have personally reviewed and evaluated these images and lab results as part of my medical decision-making.   EKG Interpretation   Date/Time:  Monday April 09 2016 09:31:06 EDT Ventricular Rate:  67 PR Interval:    QRS Duration: 109 QT Interval:  413 QTC Calculation: 436 R Axis:   87 Text Interpretation:  Sinus rhythm Borderline right axis deviation  Abnormal R-wave progression, early transition Nonspecific ST and T wave  abnormality No acute changes Confirmed by Kathrynn Humble, MD, ANKIT ([id number]) on  04/09/2016 11:48:05 AM      MDM Pt seen by Dr. Lenoria Chime.  Pt's troponin is negative x 2.  ddimer is normal.  I suspect pain is muscular  Final diagnoses:  Right-sided chest pain    No orders of the defined types were placed in this encounter.   An After Visit Summary was printed and given to the patient.   [place], PA-C 04/09/16 Anon Raices, MD 04/11/16 [phone]

## 2016-04-09 NOTE — Discharge Instructions (Signed)
Nonspecific Chest Pain  °Chest pain can be caused by many different conditions. There is always a chance that your pain could be related to something serious, such as a heart attack or a blood clot in your lungs. Chest pain can also be caused by conditions that are not life-threatening. If you have chest pain, it is very important to follow up with your health care provider. °CAUSES  °Chest pain can be caused by: °· Heartburn. °· Pneumonia or bronchitis. °· Anxiety or stress. °· Inflammation around your heart (pericarditis) or lung (pleuritis or pleurisy). °· A blood clot in your lung. °· A collapsed lung (pneumothorax). It can develop suddenly on its own (spontaneous pneumothorax) or from trauma to the chest. °· Shingles infection (varicella-zoster virus). °· Heart attack. °· Damage to the bones, muscles, and cartilage that make up your chest wall. This can include: °¨ Bruised bones due to injury. °¨ Strained muscles or cartilage due to frequent or repeated coughing or overwork. °¨ Fracture to one or more ribs. °¨ Sore cartilage due to inflammation (costochondritis). °RISK FACTORS  °Risk factors for chest pain may include: °· Activities that increase your risk for trauma or injury to your chest. °· Respiratory infections or conditions that cause frequent coughing. °· Medical conditions or overeating that can cause heartburn. °· Heart disease or family history of heart disease. °· Conditions or health behaviors that increase your risk of developing a blood clot. °· Having had chicken pox (varicella zoster). °SIGNS AND SYMPTOMS °Chest pain can feel like: °· Burning or tingling on the surface of your chest or deep in your chest. °· Crushing, pressure, aching, or squeezing pain. °· Dull or sharp pain that is worse when you move, cough, or take a deep breath. °· Pain that is also felt in your back, neck, shoulder, or arm, or pain that spreads to any of these areas. °Your chest pain may come and go, or it may stay  constant. °DIAGNOSIS °Lab tests or other studies may be needed to find the cause of your pain. Your health care provider may have you take a test called an ambulatory ECG (electrocardiogram). An ECG records your heartbeat patterns at the time the test is performed. You may also have other tests, such as: °· Transthoracic echocardiogram (TTE). During echocardiography, sound waves are used to create a picture of all of the heart structures and to look at how blood flows through your heart. °· Transesophageal echocardiogram (TEE). This is a more advanced imaging test that obtains images from inside your body. It allows your health care provider to see your heart in finer detail. °· Cardiac monitoring. This allows your health care provider to monitor your heart rate and rhythm in real time. °· Holter monitor. This is a portable device that records your heartbeat and can help to diagnose abnormal heartbeats. It allows your health care provider to track your heart activity for several days, if needed. °· Stress tests. These can be done through exercise or by taking medicine that makes your heart beat more quickly. °· Blood tests. °· Imaging tests. °TREATMENT  °Your treatment depends on what is causing your chest pain. Treatment may include: °· Medicines. These may include: °¨ Acid blockers for heartburn. °¨ Anti-inflammatory medicine. °¨ Pain medicine for inflammatory conditions. °¨ Antibiotic medicine, if an infection is present. °¨ Medicines to dissolve blood clots. °¨ Medicines to treat coronary artery disease. °· Supportive care for conditions that do not require medicines. This may include: °¨ Resting. °¨ Applying heat   or cold packs to injured areas. °¨ Limiting activities until pain decreases. °HOME CARE INSTRUCTIONS °· If you were prescribed an antibiotic medicine, finish it all even if you start to feel better. °· Avoid any activities that bring on chest pain. °· Do not use any tobacco products, including  cigarettes, chewing tobacco, or electronic cigarettes. If you need help quitting, ask your health care provider. °· Do not drink alcohol. °· Take medicines only as directed by your health care provider. °· Keep all follow-up visits as directed by your health care provider. This is important. This includes any further testing if your chest pain does not go away. °· If heartburn is the cause for your chest pain, you may be told to keep your head raised (elevated) while sleeping. This reduces the chance that acid will go from your stomach into your esophagus. °· Make lifestyle changes as directed by your health care provider. These may include: °¨ Getting regular exercise. Ask your health care provider to suggest some activities that are safe for you. °¨ Eating a heart-healthy diet. A registered dietitian can help you to learn healthy eating options. °¨ Maintaining a healthy weight. °¨ Managing diabetes, if necessary. °¨ Reducing stress. °SEEK MEDICAL CARE IF: °· Your chest pain does not go away after treatment. °· You have a rash with blisters on your chest. °· You have a fever. °SEEK IMMEDIATE MEDICAL CARE IF:  °· Your chest pain is worse. °· You have an increasing cough, or you cough up blood. °· You have severe abdominal pain. °· You have severe weakness. °· You faint. °· You have chills. °· You have sudden, unexplained chest discomfort. °· You have sudden, unexplained discomfort in your arms, back, neck, or jaw. °· You have shortness of breath at any time. °· You suddenly start to sweat, or your skin gets clammy. °· You feel nauseous or you vomit. °· You suddenly feel light-headed or dizzy. °· Your heart begins to beat quickly, or it feels like it is skipping beats. °These symptoms may represent a serious problem that is an emergency. Do not wait to see if the symptoms will go away. Get medical help right away. Call your local emergency services (911 in the [place] Do not drive yourself to the hospital. °  °This  information is not intended to replace advice given to you by your health care provider. Make sure you discuss any questions you have with your health care provider. °  °Document Released: 06/27/2005 Document Revised: 10/08/2014 Document Reviewed: 04/23/2014 °Elsevier Interactive Patient Education ©2016 [employer] ° °

## 2016-04-28 ENCOUNTER — Other Ambulatory Visit: Payer: Self-pay

## 2016-04-28 MED ORDER — OMEPRAZOLE 20 MG PO CPDR: 20.0000 mg | DELAYED_RELEASE_CAPSULE | Freq: Every day | ORAL | 0 refills | Status: DC

## 2016-04-28 MED ORDER — PRAVASTATIN SODIUM 40 MG PO TABS: 40.0000 mg | ORAL_TABLET | Freq: Every day | ORAL | 0 refills | Status: DC

## 2016-04-28 MED ORDER — TRIAMTERENE-HCTZ 37.5-25 MG PO TABS: 1.0000 | ORAL_TABLET | Freq: Every day | ORAL | 0 refills | Status: DC

## 2016-05-09 ENCOUNTER — Telehealth (HOSPITAL_COMMUNITY): Payer: Self-pay | Admitting: *Deleted

## 2016-05-09 NOTE — Telephone Encounter (Signed)
Phone call, no answer, left voice message regarding an appointment.

## 2016-05-18 ENCOUNTER — Ambulatory Visit: Payer: Managed Care, Other (non HMO) | Admitting: Family Medicine

## 2016-05-24 ENCOUNTER — Ambulatory Visit (INDEPENDENT_AMBULATORY_CARE_PROVIDER_SITE_OTHER): Payer: Managed Care, Other (non HMO) | Admitting: Family Medicine

## 2016-05-24 ENCOUNTER — Encounter: Payer: Self-pay | Admitting: Family Medicine

## 2016-05-24 VITALS — BP 132/80 | HR 83 | Resp 16 | Ht 64.0 in | Wt 135.0 lb

## 2016-05-24 DIAGNOSIS — R110 Nausea: Secondary | ICD-10-CM | POA: Insufficient documentation

## 2016-05-24 DIAGNOSIS — Z23 Encounter for immunization: Secondary | ICD-10-CM

## 2016-05-24 DIAGNOSIS — I10 Essential (primary) hypertension: Secondary | ICD-10-CM

## 2016-05-24 DIAGNOSIS — F329 Major depressive disorder, single episode, unspecified: Secondary | ICD-10-CM

## 2016-05-24 DIAGNOSIS — G44229 Chronic tension-type headache, not intractable: Secondary | ICD-10-CM

## 2016-05-24 DIAGNOSIS — R7302 Impaired glucose tolerance (oral): Secondary | ICD-10-CM

## 2016-05-24 DIAGNOSIS — G44209 Tension-type headache, unspecified, not intractable: Secondary | ICD-10-CM | POA: Insufficient documentation

## 2016-05-24 DIAGNOSIS — F32A Depression, unspecified: Secondary | ICD-10-CM

## 2016-05-24 MED ORDER — IBUPROFEN 600 MG PO TABS: ORAL_TABLET | ORAL | 3 refills | Status: DC

## 2016-05-24 MED ORDER — KETOROLAC TROMETHAMINE 60 MG/2ML IM SOLN
60.0000 mg | Freq: Once | INTRAMUSCULAR | Status: AC
Start: 2016-05-24 — End: 2016-05-24
  Administered 2016-05-24: 60 mg via INTRAMUSCULAR

## 2016-05-24 NOTE — Patient Instructions (Addendum)
F/u in 6 weeks, call if you need me sooner  Flu vaccine today  Toradol injection in office for headache  Take Medicine for sleep and depression at 9: 30 at night, [place] if unable to take   You are being referred for gall bladder study  Work on changing how you cope with challenges in your life as you are doing  Ibuprofen sent for use no more than twice per week for headache  Thank you  for choosing [facility]. We consider it a privelige to serve you.  Delivering excellent health care in a caring and  compassionate way is our goal.  Partnering with you,  so that together we can achieve this goal is our strategy.

## 2016-05-24 NOTE — Assessment & Plan Note (Signed)
Patient educated about the importance of limiting  Carbohydrate intake , the need to commit to daily physical activity for a minimum of 30 minutes , and to commit weight loss. The fact that changes in all these areas will reduce or eliminate all together the development of diabetes is stressed.   Diabetic Labs Latest Ref Rng & Units 04/09/2016 03/30/2016 08/04/2015 11/13/2014 11/05/2014  HbA1c <5.7 % - - 6.4(H) 6.0(H) -  Chol 125 - 200 mg/dL - 190 173 185 -  HDL >=46 mg/dL - 67 61 66 -  Calc LDL <130 mg/dL - 97 89 95 -  Triglycerides <150 mg/dL - 128 116 118 -  Creatinine 0.44 - 1.00 mg/dL 0.59 0.69 0.72 0.69 1.00   BP/Weight 05/24/2016 04/09/2016 03/30/2016 02/08/2016 12/23/2015 0000000 [id number]  Systolic BP Q000111Q Q000111Q Q000111Q 123456 [id number] 123456 [id number]  Diastolic BP 80 89 78 62 72 70 60  Wt. (Lbs) 135 130 134 135 137.6 143 142.1  BMI 23.17 22.3 22.99 23.16 23.61 26.15 25.98   No flowsheet data found.  Updated lab needed at/ before next visit.

## 2016-05-24 NOTE — Assessment & Plan Note (Signed)
3 week history associated with stress and worry, no focal deficitis pon exam Toradol in office and ibuprofen 600 mg twice weekly if needed Improved slepp and behavioral management of worry will reduce headache frequency and severity

## 2016-05-24 NOTE — Assessment & Plan Note (Signed)
Controlled, no change in medication DASH diet and commitment to daily physical activity for a minimum of 30 minutes discussed and encouraged, as a part of hypertension management. The importance of attaining a healthy weight is also discussed.  BP/Weight 05/24/2016 04/09/2016 03/30/2016 02/08/2016 12/23/2015 0000000 [id number]  Systolic BP Q000111Q Q000111Q Q000111Q 123456 [id number] 123456 [id number]  Diastolic BP 80 89 78 62 72 70 60  Wt. (Lbs) 135 130 134 135 137.6 143 142.1  BMI 23.17 22.3 22.99 23.16 23.61 26.15 25.98

## 2016-05-24 NOTE — Assessment & Plan Note (Signed)
3 week h/o nausea, preventing patient from eating, imaging in 2016 shows no gallstones, will eval with HIDA for function, I believe this is primarily anxiety and stress related

## 2016-05-24 NOTE — Progress Notes (Signed)
Tanya [name]     MRN: [id number]      DOB: 1950/11/08   HPI Ms. Donaghey is here for follow up and re-evaluation of chronic medical conditions, medication management and review of any available recent lab and radiology data.  Preventive health is updated, specifically  Cancer screening and Immunization.   Questions or concerns regarding consultations or procedures which the PT has had in the interim are  addressed. The PT did not take antidepressant prescribed , states that it made her too sleepy, she is  Still very tearful, overwhelmed , anxious with poor sleep and daily headaches, poor appetite and c/o mausea Confused a s to what do with her living circumstance, states spouse no longer loves her and she will not leave her possessions ROS Denies recent fever or chills. Denies sinus pressure, nasal congestion, ear pain or sore throat. Denies chest congestion, productive cough or wheezing. Denies chest pains, palpitations and leg swelling Denies , vomiting,diarrhea or constipation.   Denies dysuria, frequency, hesitancy or incontinence. Denies joint pain, swelling and limitation in mobility. Denies  seizures, numbness, or tingling. Denies suicidal or homicidal ideation. Denies skin break down or rash.   PE  BP 132/80   Pulse 83   Resp 16   Ht 5\' 4"  (1.626 m)   Wt 135 lb (61.2 kg)   SpO2 99%   BMI 23.17 kg/m   Patient alert and oriented and in no cardiopulmonary distress.  HEENT: No facial asymmetry, EOMI,   oropharynx pink and moist.  Neck supple no JVD, no mass.  Chest: Clear to auscultation bilaterally.  CVS: S1, S2 no murmurs, no S3.Regular rate.  ABD: Soft non tender.   Ext: No edema  MS: Adequate ROM spine, shoulders, hips and knees.  Skin: Intact, no ulcerations or rash noted.  Psych: Good eye contact, tearful affect. Memory intact  anxious and depressed appearing.  CNS: CN 2-12 intact, power,  normal throughout.no focal deficits noted.   Assessment &  Plan  Essential hypertension Controlled, no change in medication DASH diet and commitment to daily physical activity for a minimum of 30 minutes discussed and encouraged, as a part of hypertension management. The importance of attaining a healthy weight is also discussed.  BP/Weight 05/24/2016 04/09/2016 03/30/2016 02/08/2016 12/23/2015 0000000 [id number]  Systolic BP Q000111Q Q000111Q Q000111Q 123456 [id number] 123456 [id number]  Diastolic BP 80 89 78 62 72 70 60  Wt. (Lbs) 135 130 134 135 137.6 143 142.1  BMI 23.17 22.3 22.99 23.16 23.61 26.15 25.98       IGT (impaired glucose tolerance) Patient educated about the importance of limiting  Carbohydrate intake , the need to commit to daily physical activity for a minimum of 30 minutes , and to commit weight loss. The fact that changes in all these areas will reduce or eliminate all together the development of diabetes is stressed.   Diabetic Labs Latest Ref Rng & Units 04/09/2016 03/30/2016 08/04/2015 11/13/2014 11/05/2014  HbA1c <5.7 % - - 6.4(H) 6.0(H) -  Chol 125 - 200 mg/dL - 190 173 185 -  HDL >=46 mg/dL - 67 61 66 -  Calc LDL <130 mg/dL - 97 89 95 -  Triglycerides <150 mg/dL - 128 116 118 -  Creatinine 0.44 - 1.00 mg/dL 0.59 0.69 0.72 0.69 1.00   BP/Weight 05/24/2016 04/09/2016 03/30/2016 02/08/2016 12/23/2015 0000000 [id number]  Systolic BP Q000111Q Q000111Q Q000111Q 123456 [id number] 123456 [id number]  Diastolic BP 80 89 78 62 72 70 60  Wt. (Lbs) 135  130 134 135 137.6 143 142.1  BMI 23.17 22.3 22.99 23.16 23.61 26.15 25.98   No flowsheet data found.  Updated lab needed at/ before next visit.   Depression Though initial score was a 5, unchanged as she has been on no medication Not suicidal or homicidal. She is to start medication and call back if unable to take it Therapy would be beneficial, still not interested  Chronic tension-type headache, not intractable 3 week history associated with stress and worry, no focal deficitis pon exam Toradol in office and ibuprofen 600 mg twice weekly if  needed Improved slepp and behavioral management of worry will reduce headache frequency and severity  Nausea without vomiting 3 week h/o nausea, preventing patient from eating, imaging in 2016 shows no gallstones, will eval with HIDA for function, I believe this is primarily anxiety and stress related  Need for prophylactic vaccination and inoculation against influenza After obtaining informed consent, the vaccine is  administered by LPN.

## 2016-05-24 NOTE — Assessment & Plan Note (Signed)
Though initial score was a 5, unchanged as she has been on no medication Not suicidal or homicidal. She is to start medication and call back if unable to take it Therapy would be beneficial, still not interested

## 2016-05-24 NOTE — Assessment & Plan Note (Signed)
After obtaining informed consent, the vaccine is  administered by LPN.  

## 2016-05-28 ENCOUNTER — Encounter (HOSPITAL_COMMUNITY): Payer: Self-pay

## 2016-05-28 ENCOUNTER — Encounter (HOSPITAL_COMMUNITY)
Admission: RE | Admit: 2016-05-28 | Discharge: 2016-05-28 | Disposition: A | Discharge status: Home / Self Care | Payer: Managed Care, Other (non HMO) | Source: Ambulatory Visit | Attending: Family Medicine | Admitting: Family Medicine

## 2016-05-28 DIAGNOSIS — R110 Nausea: Secondary | ICD-10-CM | POA: Insufficient documentation

## 2016-05-28 MED ORDER — TECHNETIUM TC 99M MEBROFENIN IV KIT
5.0000 | PACK | Freq: Once | INTRAVENOUS | Status: AC | PRN
  Administered 2016-05-28: 5 via INTRAVENOUS

## 2016-05-29 ENCOUNTER — Telehealth (HOSPITAL_COMMUNITY): Payer: Self-pay | Admitting: *Deleted

## 2016-05-29 NOTE — Telephone Encounter (Signed)
left voice message regarding an appointment. 

## 2016-06-12 ENCOUNTER — Telehealth (HOSPITAL_COMMUNITY): Payer: Self-pay | Admitting: *Deleted

## 2016-06-12 NOTE — Telephone Encounter (Signed)
left 3rd voice message regarding an appointment.

## 2016-06-19 ENCOUNTER — Telehealth (HOSPITAL_COMMUNITY): Payer: Self-pay | Admitting: *Deleted

## 2016-06-19 NOTE — Telephone Encounter (Signed)
4TH phone call in attempt to reach patient.  She said she don't need appointment, she is seeing someone on her job.

## 2016-07-06 ENCOUNTER — Ambulatory Visit: Payer: Managed Care, Other (non HMO) | Admitting: Family Medicine

## 2016-07-12 ENCOUNTER — Other Ambulatory Visit: Payer: Self-pay | Admitting: Family Medicine

## 2016-07-19 ENCOUNTER — Encounter: Payer: Self-pay | Admitting: Family Medicine

## 2016-07-19 ENCOUNTER — Ambulatory Visit (INDEPENDENT_AMBULATORY_CARE_PROVIDER_SITE_OTHER): Payer: Managed Care, Other (non HMO) | Admitting: Family Medicine

## 2016-07-19 VITALS — BP 122/80 | HR 72 | Ht 64.0 in | Wt 140.0 lb

## 2016-07-19 DIAGNOSIS — E785 Hyperlipidemia, unspecified: Secondary | ICD-10-CM

## 2016-07-19 DIAGNOSIS — G44219 Episodic tension-type headache, not intractable: Secondary | ICD-10-CM

## 2016-07-19 DIAGNOSIS — E559 Vitamin D deficiency, unspecified: Secondary | ICD-10-CM

## 2016-07-19 DIAGNOSIS — R7302 Impaired glucose tolerance (oral): Secondary | ICD-10-CM

## 2016-07-19 DIAGNOSIS — M1990 Unspecified osteoarthritis, unspecified site: Secondary | ICD-10-CM

## 2016-07-19 DIAGNOSIS — I10 Essential (primary) hypertension: Secondary | ICD-10-CM | POA: Diagnosis not present

## 2016-07-19 DIAGNOSIS — R7301 Impaired fasting glucose: Secondary | ICD-10-CM

## 2016-07-19 DIAGNOSIS — L299 Pruritus, unspecified: Secondary | ICD-10-CM

## 2016-07-19 MED ORDER — NAPROXEN 375 MG PO TBEC: DELAYED_RELEASE_TABLET | ORAL | 1 refills | Status: DC

## 2016-07-19 MED ORDER — HYDROXYZINE HCL 25 MG PO TABS: ORAL_TABLET | ORAL | 2 refills | Status: DC

## 2016-07-19 NOTE — Patient Instructions (Addendum)
F/u mid February, call if you need me before  New medication for use AS NEEDED for arthritis pains in fingers.Naproxen One tablet , no more than 3 times weekly hBA1C in September is 6.2, watch sugar and sweets  New medication for use, AS NEEDED, for itch at night, hydroxyzine, one tablet  HbA1C, fasting lipid, cmp and eGFR, TSH and vit D one week before next visit  Please work on good  health habits so that your health will improve. 1. Commitment to daily physical activity for 30 to 60  minutes, if you are able to do this.  2. Commitment to wise food choices. Aim for half of your  food intake to be vegetable and fruit, one quarter starchy foods, and one quarter protein. Try to eat on a regular schedule  3 meals per day, snacking between meals should be limited to vegetables or fruits or small portions of nuts. 64 ounces of water per day is generally recommended, unless you have specific health conditions, like heart failure or kidney failure where you will need to limit fluid intake.  3. Commitment to sufficient and a  good quality of physical and mental rest daily, generally between 6 to 8 hours per day.  WITH PERSISTANCE AND PERSEVERANCE, THE IMPOSSIBLE , BECOMES THE NORM! Thank you  for choosing  Primary Care. We consider it a privelige to serve you.  Delivering excellent health care in a caring and  compassionate way is our goal.  Partnering with you,  so that together we can achieve this goal is our strategy.    Happy to meet Alder! Happy thanksgiving and Christmas, all the best for 2018 Thankful you are better!

## 2016-07-20 DIAGNOSIS — L299 Pruritus, unspecified: Secondary | ICD-10-CM | POA: Insufficient documentation

## 2016-07-20 DIAGNOSIS — M1990 Unspecified osteoarthritis, unspecified site: Secondary | ICD-10-CM | POA: Insufficient documentation

## 2016-07-20 NOTE — Assessment & Plan Note (Signed)
Controlled, no change in medication DASH diet and commitment to daily physical activity for a minimum of 30 minutes discussed and encouraged, as a part of hypertension management. The importance of attaining a healthy weight is also discussed.  BP/Weight 07/19/2016 05/24/2016 04/09/2016 03/30/2016 02/08/2016 12/23/2015 0000000  Systolic BP 123XX123 Q000111Q Q000111Q Q000111Q 123456 [id number] 123456  Diastolic BP 80 80 89 78 62 72 70  Wt. (Lbs) 140 135 130 134 135 137.6 143  BMI 24.03 23.17 22.3 22.99 23.16 23.61 26.15

## 2016-07-20 NOTE — Assessment & Plan Note (Signed)
primrily nocturnal, no new products, no rash, hydroxyzine at bedtime as needed

## 2016-07-20 NOTE — Assessment & Plan Note (Signed)
Patient educated about the importance of limiting  Carbohydrate intake , the need to commit to daily physical activity for a minimum of 30 minutes , and to commit weight loss. The fact that changes in all these areas will reduce or eliminate all together the development of diabetes is stressed.   Diabetic Labs Latest Ref Rng & Units 04/09/2016 03/30/2016 08/04/2015 11/13/2014 11/05/2014  HbA1c <5.7 % - - 6.4(H) 6.0(H) -  Chol 125 - 200 mg/dL - 190 173 185 -  HDL >=46 mg/dL - 67 61 66 -  Calc LDL <130 mg/dL - 97 89 95 -  Triglycerides <150 mg/dL - 128 116 118 -  Creatinine 0.44 - 1.00 mg/dL 0.59 0.69 0.72 0.69 1.00   BP/Weight 07/19/2016 05/24/2016 04/09/2016 03/30/2016 02/08/2016 12/23/2015 0000000  Systolic BP 123XX123 Q000111Q Q000111Q Q000111Q 123456 [id number] 123456  Diastolic BP 80 80 89 78 62 72 70  Wt. (Lbs) 140 135 130 134 135 137.6 143  BMI 24.03 23.17 22.3 22.99 23.16 23.61 26.15   No flowsheet data found.

## 2016-07-20 NOTE — Assessment & Plan Note (Signed)
Marked improvement with improved anxiety and depression, which are essentially resolved currently

## 2016-07-20 NOTE — Assessment & Plan Note (Signed)
Swelling and deformity of digits, no warmth or erythema, mild limitation in mobility, naproxen as needed , sparingly

## 2016-07-20 NOTE — Progress Notes (Signed)
Tanya [name]     MRN: [id number]      DOB: 17-Jul-1951   HPI Tanya [name] is here for follow up and re-evaluation of chronic medical conditions, medication management and review of any available recent lab and radiology data.  Preventive health is updated, specifically  Cancer screening and Immunization.    The PT stopped Remeron, states it made her "feel funny" but reports marked symptom improvement and resolution of her depression, did not see therapist , but reports great benefit from recent visit C/o pain and swelling of finger joints needs medication for as needed use C/o intermittent itching worse at night, no rash ROS Denies recent fever or chills. Denies sinus pressure, nasal congestion, ear pain or sore throat. Denies chest congestion, productive cough or wheezing. Denies chest pains, palpitations and leg swelling Denies abdominal pain, nausea, vomiting,diarrhea or constipation.   Denies dysuria, frequency, hesitancy or incontinence.  Denies headaches, seizures, numbness, or tingling. Denies depression, anxiety at times has  insomnia. Denies skin break down or rash.   PE  BP 122/80   Pulse 72   Ht 5\' 4"  (1.626 m)   Wt 140 lb (63.5 kg)   SpO2 100%   BMI 24.03 kg/m   Patient alert and oriented and in no cardiopulmonary distress.  HEENT: No facial asymmetry, EOMI,   oropharynx pink and moist.  Neck supple no JVD, no mass.  Chest: Clear to auscultation bilaterally.  CVS: S1, S2 no murmurs, no S3.Regular rate.  ABD: Soft non tender.   Ext: No edema  MS: Adequate ROM spine, shoulders, hips and knees. Deformity of IP joints of digits of both hands, no warmth or tendernss Skin: Intact, no ulcerations or rash noted.  Psych: Good eye contact, normal affect. Memory intact not anxious or depressed appearing.  CNS: CN 2-12 intact, power,  normal throughout.no focal deficits noted.   Assessment & Plan  Essential hypertension Controlled, no change in  medication DASH diet and commitment to daily physical activity for a minimum of 30 minutes discussed and encouraged, as a part of hypertension management. The importance of attaining a healthy weight is also discussed.  BP/Weight 07/19/2016 05/24/2016 04/09/2016 03/30/2016 02/08/2016 12/23/2015 0000000  Systolic BP 123XX123 Q000111Q Q000111Q Q000111Q 123456 [id number] 123456  Diastolic BP 80 80 89 78 62 72 70  Wt. (Lbs) 140 135 130 134 135 137.6 143  BMI 24.03 23.17 22.3 22.99 23.16 23.61 26.15       IGT (impaired glucose tolerance) Patient educated about the importance of limiting  Carbohydrate intake , the need to commit to daily physical activity for a minimum of 30 minutes , and to commit weight loss. The fact that changes in all these areas will reduce or eliminate all together the development of diabetes is stressed.   Diabetic Labs Latest Ref Rng & Units 04/09/2016 03/30/2016 08/04/2015 11/13/2014 11/05/2014  HbA1c <5.7 % - - 6.4(H) 6.0(H) -  Chol 125 - 200 mg/dL - 190 173 185 -  HDL >=46 mg/dL - 67 61 66 -  Calc LDL <130 mg/dL - 97 89 95 -  Triglycerides <150 mg/dL - 128 116 118 -  Creatinine 0.44 - 1.00 mg/dL 0.59 0.69 0.72 0.69 1.00   BP/Weight 07/19/2016 05/24/2016 04/09/2016 03/30/2016 02/08/2016 12/23/2015 0000000  Systolic BP 123XX123 Q000111Q Q000111Q Q000111Q 123456 [id number] 123456  Diastolic BP 80 80 89 78 62 72 70  Wt. (Lbs) 140 135 130 134 135 137.6 143  BMI 24.03 23.17 22.3 22.99 23.16 23.61 26.15  No flowsheet data found.    Headache, tension-type Marked improvement with improved anxiety and depression, which are essentially resolved currently  Pruritus primrily nocturnal, no new products, no rash, hydroxyzine at bedtime as needed  Arthritis Swelling and deformity of digits, no warmth or erythema, mild limitation in mobility, naproxen as needed , sparingly

## 2016-09-23 ENCOUNTER — Other Ambulatory Visit: Payer: Self-pay | Admitting: Family Medicine

## 2016-11-13 ENCOUNTER — Ambulatory Visit: Payer: Managed Care, Other (non HMO) | Admitting: Family Medicine

## 2016-11-26 ENCOUNTER — Encounter (HOSPITAL_COMMUNITY): Payer: Self-pay

## 2016-11-26 ENCOUNTER — Ambulatory Visit (HOSPITAL_COMMUNITY)
Admission: RE | Admit: 2016-11-26 | Discharge: 2016-11-26 | Disposition: A | Discharge status: Home / Self Care | Payer: Managed Care, Other (non HMO) | Source: Ambulatory Visit | Attending: Family Medicine | Admitting: Family Medicine

## 2016-11-26 DIAGNOSIS — Z1231 Encounter for screening mammogram for malignant neoplasm of breast: Secondary | ICD-10-CM

## 2017-01-03 ENCOUNTER — Other Ambulatory Visit: Payer: Self-pay | Admitting: Family Medicine

## 2017-02-06 ENCOUNTER — Ambulatory Visit (INDEPENDENT_AMBULATORY_CARE_PROVIDER_SITE_OTHER): Payer: Managed Care, Other (non HMO) | Admitting: Family Medicine

## 2017-02-06 ENCOUNTER — Encounter: Payer: Self-pay | Admitting: Family Medicine

## 2017-02-06 VITALS — BP 140/80 | HR 80 | Resp 16 | Ht 64.0 in | Wt 142.0 lb

## 2017-02-06 DIAGNOSIS — E7849 Other hyperlipidemia: Secondary | ICD-10-CM

## 2017-02-06 DIAGNOSIS — E784 Other hyperlipidemia: Secondary | ICD-10-CM | POA: Diagnosis not present

## 2017-02-06 DIAGNOSIS — I10 Essential (primary) hypertension: Secondary | ICD-10-CM | POA: Diagnosis not present

## 2017-02-06 DIAGNOSIS — M1990 Unspecified osteoarthritis, unspecified site: Secondary | ICD-10-CM

## 2017-02-06 DIAGNOSIS — F419 Anxiety disorder, unspecified: Secondary | ICD-10-CM | POA: Insufficient documentation

## 2017-02-06 DIAGNOSIS — R7302 Impaired glucose tolerance (oral): Secondary | ICD-10-CM

## 2017-02-06 DIAGNOSIS — L299 Pruritus, unspecified: Secondary | ICD-10-CM

## 2017-02-06 LAB — CBC
HCT: 40.4 % (ref 35.0–45.0)
Hemoglobin: 14 g/dL (ref 11.7–15.5)
MCH: 31.7 pg (ref 27.0–33.0)
MCHC: 34.7 g/dL (ref 32.0–36.0)
MCV: 91.6 fL (ref 80.0–100.0)
MPV: 8.4 fL (ref 7.5–12.5)
PLATELETS: 381 10*3/uL (ref 140–400)
RBC: 4.41 MIL/uL (ref 3.80–5.10)
RDW: 13.4 % (ref 11.0–15.0)
WBC: 5 10*3/uL (ref 3.8–10.8)

## 2017-02-06 LAB — COMPREHENSIVE METABOLIC PANEL
ALT: 20 U/L (ref 6–29)
AST: 27 U/L (ref 10–35)
Albumin: 4.5 g/dL (ref 3.6–5.1)
Alkaline Phosphatase: 59 U/L (ref 33–130)
BUN: 14 mg/dL (ref 7–25)
CHLORIDE: 102 mmol/L (ref 98–110)
CO2: 26 mmol/L (ref 20–31)
CREATININE: 0.73 mg/dL (ref 0.50–0.99)
Calcium: 9.9 mg/dL (ref 8.6–10.4)
GLUCOSE: 84 mg/dL (ref 65–99)
Potassium: 3.6 mmol/L (ref 3.5–5.3)
SODIUM: 140 mmol/L (ref 135–146)
Total Bilirubin: 0.5 mg/dL (ref 0.2–1.2)
Total Protein: 7.7 g/dL (ref 6.1–8.1)

## 2017-02-06 LAB — TSH: TSH: 1.04 mIU/L

## 2017-02-06 MED ORDER — HYDROXYZINE HCL 50 MG PO TABS: ORAL_TABLET | ORAL | 3 refills | Status: DC

## 2017-02-06 MED ORDER — ALPRAZOLAM 0.25 MG PO TABS: ORAL_TABLET | ORAL | 0 refills | Status: DC

## 2017-02-06 NOTE — Assessment & Plan Note (Signed)
Resolved from lab of 11/2016. Pt apllauded on this

## 2017-02-06 NOTE — Patient Instructions (Signed)
Physical exam in October, call if you need me sooner  Safe and happy trip!  CBC, cmp and TSH today  Medication for anxierty , is sent in , use only as directed , if needed.  Increase in dose of medication for itching  For arthritis , use tylenol 325 mg tablets up to 3 daily  Thanks for choosing [facility], we consider it a privelige to serve you.

## 2017-02-06 NOTE — Assessment & Plan Note (Signed)
Controlled, no change in medication No med change Low fat diet discussed and encouraged. Updated lab to be scanned in from job done 11/2016

## 2017-02-06 NOTE — Assessment & Plan Note (Signed)
Swelling and deformity of fingers, tylenol 325 mg twice daily as needed recommended

## 2017-02-06 NOTE — Assessment & Plan Note (Signed)
Increased and uncontrolled, anxiety related, increase hydroxyzine dose to 50 mg one at bedtime

## 2017-02-06 NOTE — Progress Notes (Signed)
   Tanya [name]     MRN: [id number]      DOB: 09/10/1951   HPI Tanya [name] is here for follow up and re-evaluation of chronic medical conditions, medication management and review of any available recent lab and radiology data. Labs were done on her job in march and these are reviewd, blood sugar, cholesterol are goodPreventive health is updated, specifically  Cancer screening and Immunization.   Pt is travelling to her [place] the [place] in am and is very nervous ,has 25 hours of travel, requests anxiety medication c/o arthritic swelling of fingers, wants to know what to take c/o itching , disturbs sleep at night , no rash, medication not as effective as originally ROS Denies recent fever or chills. Denies sinus pressure, nasal congestion, ear pain or sore throat. Denies chest congestion, productive cough or wheezing. Denies chest pains, palpitations and leg swelling Denies abdominal pain, nausea, vomiting,diarrhea or constipation.   Denies dysuria, frequency, hesitancy or incontinence.  Denies headaches, seizures, numbness, or tingling. Denies skin break down or rash.   PE  BP 140/80   Pulse 80   Resp 16   Ht 5\' 4"  (1.626 m)   Wt 142 lb (64.4 kg)   SpO2 97%   BMI 24.37 kg/m   Patient alert and oriented and in no cardiopulmonary distress.  HEENT: No facial asymmetry, EOMI,   oropharynx pink and moist.  Neck supple no JVD, no mass.  Chest: Clear to auscultation bilaterally.  CVS: S1, S2 no murmurs, no S3.Regular rate.  ABD: Soft non tender.   Ext: No edema  MS: Adequate ROM spine, shoulders, hips and knees.deformity of fingers, especially mid fingers  Skin: Intact, no ulcerations or rash noted.  Psych: Good eye contact, normal affect. Memory intact  anxious not  depressed appearing.  CNS: CN 2-12 intact, power,  normal throughout.no focal deficits noted.   Assessment & Plan  Hyperlipemia Controlled, no change in medication No med change Low fat diet  discussed and encouraged. Updated lab to be scanned in from job done 11/2016  Essential hypertension Slightly elevated SBP, pt very anxious. No med change DASH diet and commitment to daily physical activity for a minimum of 30 minutes discussed and encouraged, as a part of hypertension management. The importance of attaining a healthy weight is also discussed.  BP/Weight 02/06/2017 07/19/2016 05/24/2016 04/09/2016 03/30/2016 02/08/2016 03/26/9484  Systolic BP 462 703 500 938 182 993 716  Diastolic BP 80 80 80 89 78 62 72  Wt. (Lbs) 142 140 135 130 134 135 137.6  BMI 24.37 24.03 23.17 22.3 22.99 23.16 23.61       IGT (impaired glucose tolerance) Resolved from lab of 11/2016. Pt apllauded on this  Arthritis Swelling and deformity of fingers, tylenol 325 mg twice daily as needed recommended  Pruritus Increased and uncontrolled, anxiety related, increase hydroxyzine dose to 50 mg one at bedtime  Anxiety Increased anxiety as pt prepares for 25 hour flight, six xanax tablets prescribed, low dose 0.25 mg tablets for use, three on each of the trip

## 2017-02-06 NOTE — Assessment & Plan Note (Signed)
Increased anxiety as pt prepares for 25 hour flight, six xanax tablets prescribed, low dose 0.25 mg tablets for use, three on each of the trip

## 2017-02-06 NOTE — Assessment & Plan Note (Signed)
Slightly elevated SBP, pt very anxious. No med change DASH diet and commitment to daily physical activity for a minimum of 30 minutes discussed and encouraged, as a part of hypertension management. The importance of attaining a healthy weight is also discussed.  BP/Weight 02/06/2017 07/19/2016 05/24/2016 04/09/2016 03/30/2016 02/08/2016 7/97/2820  Systolic BP 601 561 537 943 276 147 092  Diastolic BP 80 80 80 89 78 62 72  Wt. (Lbs) 142 140 135 130 134 135 137.6  BMI 24.37 24.03 23.17 22.3 22.99 23.16 23.61

## 2017-03-31 ENCOUNTER — Other Ambulatory Visit: Payer: Self-pay | Admitting: Family Medicine

## 2017-04-01 NOTE — Telephone Encounter (Signed)
Seen [phone] 

## 2017-07-17 ENCOUNTER — Encounter: Payer: Self-pay | Admitting: Family Medicine

## 2017-07-17 ENCOUNTER — Ambulatory Visit (INDEPENDENT_AMBULATORY_CARE_PROVIDER_SITE_OTHER): Payer: Managed Care, Other (non HMO) | Admitting: Family Medicine

## 2017-07-17 VITALS — BP 122/70 | HR 67 | Temp 98.0°F | Resp 16 | Ht 64.0 in | Wt 138.8 lb

## 2017-07-17 DIAGNOSIS — Z0000 Encounter for general adult medical examination without abnormal findings: Secondary | ICD-10-CM

## 2017-07-17 DIAGNOSIS — E7849 Other hyperlipidemia: Secondary | ICD-10-CM | POA: Diagnosis not present

## 2017-07-17 DIAGNOSIS — F419 Anxiety disorder, unspecified: Secondary | ICD-10-CM

## 2017-07-17 DIAGNOSIS — M1990 Unspecified osteoarthritis, unspecified site: Secondary | ICD-10-CM

## 2017-07-17 DIAGNOSIS — R7302 Impaired glucose tolerance (oral): Secondary | ICD-10-CM

## 2017-07-17 DIAGNOSIS — Z1211 Encounter for screening for malignant neoplasm of colon: Secondary | ICD-10-CM | POA: Diagnosis not present

## 2017-07-17 DIAGNOSIS — F5101 Primary insomnia: Secondary | ICD-10-CM

## 2017-07-17 DIAGNOSIS — I10 Essential (primary) hypertension: Secondary | ICD-10-CM | POA: Diagnosis not present

## 2017-07-17 DIAGNOSIS — L299 Pruritus, unspecified: Secondary | ICD-10-CM

## 2017-07-17 DIAGNOSIS — M19042 Primary osteoarthritis, left hand: Secondary | ICD-10-CM

## 2017-07-17 DIAGNOSIS — M19041 Primary osteoarthritis, right hand: Secondary | ICD-10-CM

## 2017-07-17 LAB — HEMOCCULT GUIAC POC 1CARD (OFFICE): Fecal Occult Blood, POC: NEGATIVE

## 2017-07-17 MED ORDER — HYDROXYZINE PAMOATE 100 MG PO CAPS: ORAL_CAPSULE | ORAL | 4 refills | Status: DC

## 2017-07-17 NOTE — Patient Instructions (Addendum)
F/u in 5 months, call if you need me sooner  Fasting lipid, cmp and HBA1c this week   You will be referred to Rheumatologist regarding swelling and deformity of the joints in your hands for further evaluation and management  Please schedule your mammogram which is due Februaury 27 or after  Thank you  for choosing [facility]. We consider it a privelige to serve you.  Delivering excellent health care in a caring and  compassionate way is our goal.  Partnering with you,  so that together we can achieve this goal is our strategy.

## 2017-07-21 DIAGNOSIS — M19041 Primary osteoarthritis, right hand: Secondary | ICD-10-CM | POA: Insufficient documentation

## 2017-07-21 DIAGNOSIS — M19042 Primary osteoarthritis, left hand: Secondary | ICD-10-CM

## 2017-07-21 NOTE — Assessment & Plan Note (Signed)
Need to increase dose of hydroxyzine Sleep hygiene reviewed and written information offered also. Prescription sent for increased dose of  medication needed.

## 2017-07-21 NOTE — Assessment & Plan Note (Signed)
Symptomatic only at night , interfering with sleep. Increase hydroxyzine dose

## 2017-07-21 NOTE — Progress Notes (Signed)
    Tanya [name]     MRN: [id number]      DOB: 06/01/1951  HPI: Patient is in for annual physical exam. C/o excess itching at night and inability to sleep as a result, hydroxyzine 50 mg is of little to no benefit.Dose will be increased. C/o excess swelling and deformity of joints in hands with stiffness and limited function, needs rheumatology evaluation. Immunization is reviewed , will get vaccine on her job.   PE:  BP 122/70 (BP Location: Left Arm, Patient Position: Sitting, Cuff Size: Normal)   Pulse 67   Temp 98 F (36.7 C) (Other (Comment))   Resp 16   Ht 5\' 4"  (1.626 m)   Wt 138 lb 12 oz (62.9 kg)   SpO2 98%   BMI 23.82 kg/m   Pleasant  female, alert and oriented x 3, in no cardio-pulmonary distress. Afebrile. HEENT No facial trauma or asymetry. Sinuses non tender.  Extra occullar muscles intact, pupils equally reactive to light. External ears normal, tympanic membranes clear. Oropharynx moist, no exudate. Neck: supple, no adenopathy,JVD or thyromegaly.No bruits.  Chest: Clear to ascultation bilaterally.No crackles or wheezes. Non tender to palpation  Breast: No asymetry,no masses or lumps. No tenderness. No nipple discharge or inversion. No axillary or supraclavicular adenopathy  Cardiovascular system; Heart sounds normal,  S1 and  S2 ,no S3.  No murmur, or thrill. Apical beat not displaced Peripheral pulses normal.  Abdomen: Soft, non tender, no organomegaly or masses. No bruits. Bowel sounds normal. No guarding, tenderness or rebound.  Rectal:  Normal sphincter tone. No rectal mass. Guaiac negative stool.  GU: Not examined. Asymptomatic and not indicated   Musculoskeletal exam: Full ROM of spine, hips , shoulders and knees. No deformity ,swelling or crepitus noted. No muscle wasting or atrophy.  Deformity with limited mobility and reduced strength in grip affecting digits of both hands  Neurologic: Cranial nerves 2 to 12 intact. Power,  tone ,sensation and reflexes normal throughout. No disturbance in gait. No tremor.  Skin: Intact, no ulceration, erythema , scaling or rash noted. Pigmentation normal throughout  Psych; Normal mood and affect. Judgement and concentration normal   Assessment & Plan:   Annual physical exam Annual exam as documented.  Immunization and cancer screening needs are specifically addressed at this visit.   Anxiety Need to increase dose of hydroxyzine Sleep hygiene reviewed and written information offered also. Prescription sent for increased dose of  medication needed.   Arthritis Bilateral swelling and deformity of joints of fingers of both hands with increased pain and reduced function,  Refer to rheumatology for definitive diagnosis and management. High suspiscion for rheumatoid arthritis  Insomnia disorder Uncontrolled Sleep hygiene reviewed and written information offered also. Prescription sent for  medication needed.   Pruritus Symptomatic only at night , interfering with sleep. Increase hydroxyzine dose

## 2017-07-21 NOTE — Assessment & Plan Note (Signed)
Bilateral swelling and deformity of joints of fingers of both hands with increased pain and reduced function,  Refer to rheumatology for definitive diagnosis and management. High suspiscion for rheumatoid arthritis

## 2017-07-21 NOTE — Assessment & Plan Note (Signed)
Annual exam as documented. . Immunization and cancer screening needs are specifically addressed at this visit.  

## 2017-07-21 NOTE — Assessment & Plan Note (Signed)
Uncontrolled Sleep hygiene reviewed and written information offered also. Prescription sent for  medication needed.  

## 2017-07-27 LAB — COMPLETE METABOLIC PANEL WITH GFR
AG Ratio: 1.4 (calc) (ref 1.0–2.5)
ALBUMIN MSPROF: 4.4 g/dL (ref 3.6–5.1)
ALT: 19 U/L (ref 6–29)
AST: 26 U/L (ref 10–35)
Alkaline phosphatase (APISO): 60 U/L (ref 33–130)
BUN: 16 mg/dL (ref 7–25)
CALCIUM: 9.4 mg/dL (ref 8.6–10.4)
CO2: 32 mmol/L (ref 20–32)
CREATININE: 0.71 mg/dL (ref 0.50–0.99)
Chloride: 102 mmol/L (ref 98–110)
GFR, EST NON AFRICAN AMERICAN: 88 mL/min/{1.73_m2} (ref >=60)
GFR, Est African American: 102 mL/min/{1.73_m2} (ref >=60)
GLOBULIN: 3.1 g/dL (ref 1.9–3.7)
Glucose, Bld: 94 mg/dL (ref 65–99)
Potassium: 3.9 mmol/L (ref 3.5–5.3)
SODIUM: 140 mmol/L (ref 135–146)
Total Bilirubin: 0.6 mg/dL (ref 0.2–1.2)
Total Protein: 7.5 g/dL (ref 6.1–8.1)

## 2017-07-27 LAB — HEMOGLOBIN A1C
HEMOGLOBIN A1C: 5.8 %{Hb} — AB (ref <5.7)
MEAN PLASMA GLUCOSE: 120 (calc)
eAG (mmol/L): 6.6 (calc)

## 2017-07-27 LAB — LIPID PANEL
CHOL/HDL RATIO: 2.7 (calc) (ref <5.0)
CHOLESTEROL: 180 mg/dL (ref <200)
HDL: 66 mg/dL (ref >50)
LDL Cholesterol (Calc): 95 mg/dL (calc)
Non-HDL Cholesterol (Calc): 114 mg/dL (calc) (ref <130)
Triglycerides: 94 mg/dL (ref <150)

## 2017-07-30 ENCOUNTER — Encounter: Payer: Self-pay | Admitting: Family Medicine

## 2017-09-28 ENCOUNTER — Other Ambulatory Visit: Payer: Self-pay | Admitting: Family Medicine

## 2017-10-25 ENCOUNTER — Other Ambulatory Visit: Payer: Self-pay | Admitting: Family Medicine

## 2017-10-25 DIAGNOSIS — Z1231 Encounter for screening mammogram for malignant neoplasm of breast: Secondary | ICD-10-CM

## 2017-11-27 ENCOUNTER — Encounter (HOSPITAL_COMMUNITY): Payer: Self-pay

## 2017-11-27 ENCOUNTER — Ambulatory Visit (HOSPITAL_COMMUNITY)
Admission: RE | Admit: 2017-11-27 | Discharge: 2017-11-27 | Disposition: A | Discharge status: Home / Self Care | Payer: Managed Care, Other (non HMO) | Source: Ambulatory Visit | Attending: Family Medicine | Admitting: Family Medicine

## 2017-11-27 DIAGNOSIS — Z1231 Encounter for screening mammogram for malignant neoplasm of breast: Secondary | ICD-10-CM | POA: Diagnosis present

## 2017-12-16 ENCOUNTER — Ambulatory Visit: Payer: Managed Care, Other (non HMO) | Admitting: Family Medicine

## 2017-12-16 ENCOUNTER — Encounter: Payer: Self-pay | Admitting: Family Medicine

## 2017-12-16 ENCOUNTER — Ambulatory Visit (INDEPENDENT_AMBULATORY_CARE_PROVIDER_SITE_OTHER): Payer: Managed Care, Other (non HMO) | Admitting: Family Medicine

## 2017-12-16 VITALS — BP 130/70 | HR 77 | Resp 16 | Ht 64.0 in | Wt 144.0 lb

## 2017-12-16 DIAGNOSIS — M19041 Primary osteoarthritis, right hand: Secondary | ICD-10-CM

## 2017-12-16 DIAGNOSIS — R21 Rash and other nonspecific skin eruption: Secondary | ICD-10-CM | POA: Diagnosis not present

## 2017-12-16 DIAGNOSIS — M19042 Primary osteoarthritis, left hand: Secondary | ICD-10-CM | POA: Diagnosis not present

## 2017-12-16 DIAGNOSIS — I10 Essential (primary) hypertension: Secondary | ICD-10-CM

## 2017-12-16 DIAGNOSIS — L03031 Cellulitis of right toe: Secondary | ICD-10-CM | POA: Diagnosis not present

## 2017-12-16 DIAGNOSIS — L299 Pruritus, unspecified: Secondary | ICD-10-CM

## 2017-12-16 MED ORDER — PREDNISONE 10 MG PO TABS: ORAL_TABLET | ORAL | 0 refills | Status: DC

## 2017-12-16 MED ORDER — DOXYCYCLINE HYCLATE 100 MG PO TABS: 100.0000 mg | ORAL_TABLET | Freq: Two times a day (BID) | ORAL | 0 refills | Status: DC

## 2017-12-16 MED ORDER — CETIRIZINE HCL 10 MG PO TABS: 10.0000 mg | ORAL_TABLET | Freq: Every day | ORAL | 3 refills | Status: DC

## 2017-12-16 MED ORDER — HYDROXYZINE HCL 25 MG PO TABS: ORAL_TABLET | ORAL | 3 refills | Status: DC

## 2017-12-16 MED ORDER — METHYLPREDNISOLONE ACETATE 80 MG/ML IJ SUSP
40.0000 mg | Freq: Once | INTRAMUSCULAR | Status: AC
  Administered 2017-12-16: 40 mg via INTRAMUSCULAR

## 2017-12-16 NOTE — Patient Instructions (Addendum)
F/u in end August, call if you need me sooner  For infected 2nd toe, 1 week of antibiotic prescribed, start today Please stop cutting toenails so close! Work excuse from today, return 12/19/2017  For rash which may due to alleve , use tylenol instead for pain or fever Depo medrol 40 mg iM in office today for rash and 5 days of prednisone and medication at bedtime for itching is prescribed also daily certrizine  You are also referred to dermatologist Dr Hall/ [facility] because of you chronic itching which is disruptive  We will get back to you re the rheumatology appointment which was requested last Fall, call to referral staff by end of this week for f/u on this please

## 2017-12-16 NOTE — Assessment & Plan Note (Signed)
2 day history affecting neck refer to derm, anxiour to be seen. Depo medrol, prednisone, zyrtec and hydroxyzine prescribed also

## 2017-12-16 NOTE — Assessment & Plan Note (Signed)
Chronic and uncontrolled , derm to eval

## 2017-12-16 NOTE — Progress Notes (Signed)
   Tanya [name]     MRN: [id number]      DOB: 1951/03/06   HPI Tanya [name] is here painful oozing right 2nd toe x 4 days, cut her toenail too short, states had fever x 2 days, was using aleve, better yesterday Rash itchy on anterior chest, neck and under little under arms   C.o needing to see rheumatologist because of painful, swollen joints of fingers, and poor grip, and  Generalized joint pains and aches.States that the Doctor she was referred to does not accept her insurance, I will need to f/u on this  ROS  Denies sinus pressure, nasal congestion, ear pain or sore throat. Denies chest congestion, productive cough or wheezing. Denies chest pains, palpitations and leg swelling Denies abdominal pain, nausea, vomiting,diarrhea or constipation.   Denies dysuria, frequency, hesitancy or incontinence.  Denies headaches, seizures, numbness, or tingling. Denies depression, c/o chronic  anxiety and insomnia.    PE  BP 130/70   Pulse 77   Resp 16   Ht 5\' 4"  (1.626 m)   Wt 144 lb (65.3 kg)   SpO2 98%   BMI 24.72 kg/m   Patient alert and oriented and in no cardiopulmonary distress. Anxious HEENT: No facial asymmetry, EOMI,   oropharynx pink and moist.  Neck supple no JVD, no mass.  Chest: Clear to auscultation bilaterally.  CVS: S1, S2 no murmurs, no S3.Regular rate.  ABD: Soft non tender.   Ext: No edema  MS: Adequate ROM spine, shoulders, hips and knees. Deformity of all digits of upper extremities with nodules  Skin: Erythematous macular papular  rash on anterior chest and upper extremities to upper 1/3 arm, no drainage Skin breakdown with scant   drainage from right 2nd toe and mild erythema Psych: Good eye contact, normal affect. Memory intact  anxious not  depressed appearing.  CNS: CN 2-12 intact, power,  normal throughout.no focal deficits noted.   Assessment & Plan  Pruritus Chronic and uncontrolled , derm to eval  Rash and nonspecific skin eruption 2  day history affecting neck refer to derm, anxiour to be seen. Depo medrol, prednisone, zyrtec and hydroxyzine prescribed also  Infected nail bed of toe Antibiotic course prescribed, work excuse and pt educated against cutting toenails as short as she has been doing  Essential hypertension Controlled, no change in medication DASH diet and commitment to daily physical activity for a minimum of 30 minutes discussed and encouraged, as a part of hypertension management. The importance of attaining a healthy weight is also discussed.  BP/Weight 12/16/2017 07/17/2017 02/06/2017 07/19/2016 05/24/2016 04/09/2016 0/16/5537  Systolic BP 482 707 867 544 920 100 712  Diastolic BP 70 70 80 80 80 89 78  Wt. (Lbs) 144 138.75 142 140 135 130 134  BMI 24.72 23.82 24.37 24.03 23.17 22.3 22.99       Arthritis of both hands Ongoing bilateral hand pain , joint deformity with nodules and weakness. Suspect rheumatoid arthritis still awaiting  Appointment. Will re submit  referral

## 2017-12-18 ENCOUNTER — Ambulatory Visit: Payer: Managed Care, Other (non HMO) | Admitting: Family Medicine

## 2017-12-27 DIAGNOSIS — L03039 Cellulitis of unspecified toe: Secondary | ICD-10-CM | POA: Insufficient documentation

## 2017-12-27 NOTE — Assessment & Plan Note (Signed)
Controlled, no change in medication DASH diet and commitment to daily physical activity for a minimum of 30 minutes discussed and encouraged, as a part of hypertension management. The importance of attaining a healthy weight is also discussed.  BP/Weight 12/16/2017 07/17/2017 02/06/2017 07/19/2016 05/24/2016 04/09/2016 12/13/3886  Systolic BP 757 972 820 601 561 537 943  Diastolic BP 70 70 80 80 80 89 78  Wt. (Lbs) 144 138.75 142 140 135 130 134  BMI 24.72 23.82 24.37 24.03 23.17 22.3 22.99

## 2017-12-27 NOTE — Assessment & Plan Note (Signed)
Ongoing bilateral hand pain , joint deformity with nodules and weakness. Suspect rheumatoid arthritis still awaiting  Appointment. Will re submit  referral

## 2017-12-27 NOTE — Assessment & Plan Note (Signed)
Antibiotic course prescribed, work excuse and pt educated against cutting toenails as short as she has been doing

## 2018-01-22 NOTE — Progress Notes (Signed)
Office Visit Note  Patient: Tanya [name]             Date of Birth: 31-Dec-1950           MRN: [id number]             PCP: [name], MD Referring: [name], MD Visit Date: 01/23/2018 Occupation: Glass blower/designer    Subjective:  Pain in both hands and knees.   History of Present Illness: Tanya [name] is a 67 y.o. female seen in consultation per request of Dr. Moshe Cipro.  According to patient she has had pain and swelling in her hands for the last 6 years.  She has been seen by rheumatologist in the past she does not recall the name of.  She states she has had cortisone injections to her hands and was given some medication.  She continues to have pain and swelling in her hands.  She also complains of pain in her knee joints off and on.  She reports having a rash on her face and neck for at least 3 years.  She has been seeing Dr. Nevada Crane who prescribed some topical agents.  None of the other joints are painful.  She was given prednisone in March 2019 by Dr. Moshe Cipro.  Activities of Daily Living:  Patient reports morning stiffness for 10 minutes.   Patient Reports nocturnal pain.  Difficulty dressing/grooming: Denies Difficulty climbing stairs: Reports Difficulty getting out of chair: Denies Difficulty using hands for taps, buttons, cutlery, and/or writing: Reports   Review of Systems  Constitutional: Positive for fatigue. Negative for night sweats, weight gain and weight loss.  HENT: Negative for mouth sores, trouble swallowing, trouble swallowing, mouth dryness and nose dryness.   Eyes: Negative for pain, redness, visual disturbance and dryness.  Respiratory: Negative for cough, shortness of breath and difficulty breathing.   Cardiovascular: Negative for chest pain, palpitations, hypertension, irregular heartbeat and swelling in legs/feet.  Gastrointestinal: Negative for blood in stool, constipation and diarrhea.  Endocrine: Negative for increased urination.    Genitourinary: Negative for vaginal dryness.  Musculoskeletal: Positive for arthralgias, joint pain, joint swelling and morning stiffness. Negative for myalgias, muscle weakness, muscle tenderness and myalgias.  Skin: Positive for rash and sensitivity to sunlight. Negative for color change, hair loss, skin tightness and ulcers.  Allergic/Immunologic: Negative for susceptible to infections.  Neurological: Negative for dizziness, memory loss, night sweats and weakness.  Hematological: Negative for swollen glands.  Psychiatric/Behavioral: Positive for sleep disturbance. Negative for depressed mood. The patient is not nervous/anxious.     PMFS History:  Patient Active Problem List   Diagnosis Date Noted  . Infected nail bed of toe 12/27/2017  . Rash and nonspecific skin eruption 12/16/2017  . Arthritis of both hands 07/21/2017  . Anxiety 02/06/2017  . Pruritus 07/20/2016  . Arthritis 07/20/2016  . Tubular adenoma of colon 02/11/2016  . Coronary artery calcification seen on CAT scan - Moderate LAD lesion noted 11/16/2014  . Abnormal CT scan, chest 11/08/2014  . IGT (impaired glucose tolerance) 06/11/2014  . Osteopenia 01/27/2014  . Microscopic hematuria 01/05/2013  . Urinary incontinence, mixed 01/05/2013  . GERD (gastroesophageal reflux disease) 12/16/2009  . Hyperlipemia 04/09/2008  . Essential hypertension 04/09/2008  . Insomnia disorder 04/09/2008    Past Medical History:  Diagnosis Date  . Anhedonia   . Cystitis   . Elevated liver enzymes   . Fatty liver   . GERD (gastroesophageal reflux disease)   . Hyperlipidemia   .  Hypertension   . Insomnia   . Unspecified hypothyroidism   . Vaginitis     Family History  Problem Relation Age of Onset  . Dementia Mother   . Heart disease Mother        heart attack   . Hypertension Mother   . Heart attack Father   . Heart disease Father        heart attack   . Hypertension Father    Past Surgical History:  Procedure  Laterality Date  . COLONOSCOPY N/A 02/08/2016   Procedure: COLONOSCOPY;  Surgeon: [name], MD;  Location: [facility];  Service: Endoscopy;  Laterality: N/A;  1200 - moved to 11:45 - Ann to notify  . LEFT HEART CATHETERIZATION WITH CORONARY ANGIOGRAM N/A 11/19/2014   Procedure: LEFT HEART CATHETERIZATION WITH CORONARY ANGIOGRAM;  Surgeon: [name], MD;  Location: [facility] CATH LAB;  Service: Cardiovascular;  Laterality: N/A;   Social History   Social History Narrative   Pt originally from the [place] been in [place] for 20 years       Objective: Vital Signs: BP (!) 166/85 (BP Location: Left Arm, Patient Position: Sitting, Cuff Size: Normal)   Pulse 62   Resp 15   Ht 5\' 4"  (1.626 m)   Wt 141 lb (64 kg)   BMI 24.20 kg/m    Physical Exam  Constitutional: She is oriented to person, place, and time. She appears well-developed and well-nourished.  HENT:  Head: Normocephalic and atraumatic.  Eyes: Conjunctivae and EOM are normal.  Neck: Normal range of motion.  Cardiovascular: Normal rate, regular rhythm, normal heart sounds and intact distal pulses.  Pulmonary/Chest: Effort normal and breath sounds normal.  Abdominal: Soft. Bowel sounds are normal.  Lymphadenopathy:    She has no cervical adenopathy.  Neurological: She is alert and oriented to person, place, and time.  Skin: Skin is warm and dry. Capillary refill takes less than 2 seconds.  Psychiatric: She has a normal mood and affect. Her behavior is normal.  Nursing note and vitals reviewed.    Musculoskeletal Exam: C-spine thoracic lumbar spine good range of motion.  Shoulder joint supple joints but good range of motion.  She has some thickening of the bilateral second MCP joint.  She also has thickening of PIP and DIP joints with tenderness across her PIP joints.  She has good range of motion of her hip joints.  There was some warmth and swelling in the right knee joint.  Left knee joint was in good range of motion.   There was no tenderness or ankles MTPs of PIPs.  CDAI Exam: No CDAI exam completed.    Investigation: No additional findings. CBC Latest Ref Rng & Units 02/06/2017 04/09/2016 03/30/2016  WBC 3.8 - 10.8 K/uL 5.0 6.0 5.3  Hemoglobin 11.7 - 15.5 g/dL 14.0 13.6 13.2  Hematocrit 35.0 - 45.0 % 40.4 39.8 38.9  Platelets 140 - 400 K/uL 381 346 384   CMP Latest Ref Rng & Units 07/26/2017 02/06/2017 04/09/2016  Glucose 65 - 99 mg/dL 94 84 88  BUN 7 - 25 mg/dL 16 14 17   Creatinine 0.50 - 0.99 mg/dL 0.71 0.73 0.59  Sodium 135 - 146 mmol/L 140 140 139  Potassium 3.5 - 5.3 mmol/L 3.9 3.6 3.1(L)  Chloride 98 - 110 mmol/L 102 102 100(L)  CO2 20 - 32 mmol/L 32 26 29  Calcium 8.6 - 10.4 mg/dL 9.4 9.9 10.5(H)  Total Protein 6.1 - 8.1 g/dL 7.5 7.7 8.6(H)  Total Bilirubin 0.2 - 1.2 mg/dL 0.6 0.5 0.8  Alkaline Phos 33 - 130 U/L - 59 67  AST 10 - 35 U/L 26 27 32  ALT 6 - 29 U/L 19 20 25     Imaging: Xr Hand 2 View Left  Result Date: 01/23/2018 PIP and DIP narrowing was noted.  No MCP changes were noted.  No intercarpal radiocarpal changes were noted.  No erosive changes were noted. Impression: These findings are consistent with osteoarthritis of the hand.  Xr Hand 2 View Right  Result Date: 01/23/2018 PIP and DIP narrowing was noted.  Subluxation of first and third PIP joints was noted.  No significant narrowing of the MCP joints intercarpal radiocarpal joints was noted.  No erosive changes were noted.  Mild juxta-articular osteopenia was noted. Impression: These findings are consistent with osteoarthritis.  Xr Knee 3 View Left  Result Date: 01/23/2018 Moderate medial compartment narrowing.  No chondrocalcinosis was noted.  Mild patellofemoral narrowing was noted. Impression - moderate osteoarthritis and mild chondromalacia patella.  Xr Knee 3 View Right  Result Date: 01/23/2018 Moderate medial compartment narrowing.  No chondrocalcinosis was noted.  Mild patellofemoral narrowing was noted. Impression  - moderate osteoarthritis and mild chondromalacia patella.   Speciality Comments: No specialty comments available.    Procedures:  No procedures performed Allergies: Patient has no known allergies.   Assessment / Plan:     Visit Diagnoses: Pain in both hands -she has history of chronic pain in her hands.  She states that she was treated with dermatologist in the past with cortisone injections and some medications which she does not recall.  She has some tenderness across her PIPs today.  I will obtain following x-rays and labs.  Plan: XR Hand 2 View Right, XR Hand 2 View Left.  X-ray findings are consistent with osteoarthritis of the hands.  As she gives a history of recurrent swelling I will schedule ultrasound of her bilateral hands to evaluate this further.  A handout on hand exercises was given.  Chronic pain of both knees -she has warmth and swelling in her right knee joint.  Left knee joint is in good range of motion.  Plan: XR KNEE 3 VIEW RIGHT, XR KNEE 3 VIEW LEFT.  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  A handout on knee exercises was given.  Rash and nonspecific skin eruption-patient gives history of rash off and on for the last 3 years.  She was seen by dermatologist Dr. [place] Crane and was given some topical lesions.  I do not have those records available.  Osteopenia, unspecified location-she reports being on calcium and vitamin D.  Coronary artery calcification seen on CT scan - Moderate LAD lesion noted  Essential hypertension-her blood pressure is elevated today.  History of fatty infiltration of liver  History of hyperlipidemia  Primary insomnia    Orders: Orders Placed This Encounter  Procedures  . XR Hand 2 View Right  . XR Hand 2 View Left  . XR KNEE 3 VIEW RIGHT  . XR KNEE 3 VIEW LEFT  . CBC with Differential/Platelet  . COMPLETE METABOLIC PANEL WITH GFR  . Urinalysis, Routine w reflex microscopic  . CK  .  Sedimentation rate  . Rheumatoid factor  . Cyclic citrul peptide antibody, IgG  . ANA  . Uric acid  . Angiotensin converting enzyme   No orders of the defined types were placed in this encounter.   Face-to-face time spent with patient was  50 minutes.  Greater than 50% of time was spent in counseling and coordination of care.  Follow-Up Instructions: Return for Polyarthralgia.   Bo Merino, MD  Note - This record has been created using Editor, commissioning.  Chart creation errors have been sought, but may not always  have been located. Such creation errors do not reflect on  the standard of medical care.

## 2018-01-23 ENCOUNTER — Encounter: Payer: Self-pay | Admitting: Rheumatology

## 2018-01-23 ENCOUNTER — Ambulatory Visit (INDEPENDENT_AMBULATORY_CARE_PROVIDER_SITE_OTHER): Payer: Managed Care, Other (non HMO)

## 2018-01-23 ENCOUNTER — Ambulatory Visit (INDEPENDENT_AMBULATORY_CARE_PROVIDER_SITE_OTHER): Payer: Self-pay

## 2018-01-23 ENCOUNTER — Ambulatory Visit: Payer: Managed Care, Other (non HMO) | Admitting: Rheumatology

## 2018-01-23 VITALS — BP 166/85 | HR 62 | Resp 15 | Ht 64.0 in | Wt 141.0 lb

## 2018-01-23 DIAGNOSIS — R21 Rash and other nonspecific skin eruption: Secondary | ICD-10-CM

## 2018-01-23 DIAGNOSIS — M25562 Pain in left knee: Secondary | ICD-10-CM | POA: Diagnosis not present

## 2018-01-23 DIAGNOSIS — M8580 Other specified disorders of bone density and structure, unspecified site: Secondary | ICD-10-CM | POA: Diagnosis not present

## 2018-01-23 DIAGNOSIS — M79642 Pain in left hand: Secondary | ICD-10-CM

## 2018-01-23 DIAGNOSIS — F5101 Primary insomnia: Secondary | ICD-10-CM | POA: Diagnosis not present

## 2018-01-23 DIAGNOSIS — M79641 Pain in right hand: Secondary | ICD-10-CM

## 2018-01-23 DIAGNOSIS — Z8719 Personal history of other diseases of the digestive system: Secondary | ICD-10-CM | POA: Diagnosis not present

## 2018-01-23 DIAGNOSIS — I2510 Atherosclerotic heart disease of native coronary artery without angina pectoris: Secondary | ICD-10-CM | POA: Diagnosis not present

## 2018-01-23 DIAGNOSIS — M25561 Pain in right knee: Secondary | ICD-10-CM

## 2018-01-23 DIAGNOSIS — Z8639 Personal history of other endocrine, nutritional and metabolic disease: Secondary | ICD-10-CM

## 2018-01-23 DIAGNOSIS — I10 Essential (primary) hypertension: Secondary | ICD-10-CM

## 2018-01-23 DIAGNOSIS — R5383 Other fatigue: Secondary | ICD-10-CM

## 2018-01-23 DIAGNOSIS — G8929 Other chronic pain: Secondary | ICD-10-CM

## 2018-01-23 LAB — COMPLETE METABOLIC PANEL WITH GFR
AG Ratio: 1.4 (calc) (ref 1.0–2.5)
ALT: 27 U/L (ref 6–29)
AST: 32 U/L (ref 10–35)
Albumin: 4.7 g/dL (ref 3.6–5.1)
Alkaline phosphatase (APISO): 65 U/L (ref 33–130)
BUN: 14 mg/dL (ref 7–25)
CALCIUM: 10.5 mg/dL — AB (ref 8.6–10.4)
CO2: 33 mmol/L — AB (ref 20–32)
Chloride: 101 mmol/L (ref 98–110)
Creat: 0.67 mg/dL (ref 0.50–0.99)
GFR, EST AFRICAN AMERICAN: 105 mL/min/{1.73_m2} (ref >=60)
GFR, EST NON AFRICAN AMERICAN: 90 mL/min/{1.73_m2} (ref >=60)
GLUCOSE: 93 mg/dL (ref 65–99)
Globulin: 3.3 g/dL (calc) (ref 1.9–3.7)
Potassium: 3.9 mmol/L (ref 3.5–5.3)
Sodium: 140 mmol/L (ref 135–146)
TOTAL PROTEIN: 8 g/dL (ref 6.1–8.1)
Total Bilirubin: 0.4 mg/dL (ref 0.2–1.2)

## 2018-01-23 LAB — CBC WITH DIFFERENTIAL/PLATELET
BASOS PCT: 1.8 %
Basophils Absolute: 110 cells/uL (ref 0–200)
EOS PCT: 2.6 %
Eosinophils Absolute: 159 cells/uL (ref 15–500)
HCT: 41.5 % (ref 35.0–45.0)
Hemoglobin: 14.1 g/dL (ref 11.7–15.5)
Lymphs Abs: 2403 cells/uL (ref 850–3900)
MCH: 30.5 pg (ref 27.0–33.0)
MCHC: 34 g/dL (ref 32.0–36.0)
MCV: 89.8 fL (ref 80.0–100.0)
MONOS PCT: 9 %
MPV: 8.4 fL (ref 7.5–12.5)
Neutro Abs: 2879 cells/uL (ref 1500–7800)
Neutrophils Relative %: 47.2 %
PLATELETS: 388 10*3/uL (ref 140–400)
RBC: 4.62 10*6/uL (ref 3.80–5.10)
RDW: 13.7 % (ref 11.0–15.0)
TOTAL LYMPHOCYTE: 39.4 %
WBC: 6.1 10*3/uL (ref 3.8–10.8)
WBCMIX: 549 {cells}/uL (ref 200–950)

## 2018-01-23 LAB — URIC ACID: Uric Acid, Serum: 6.1 mg/dL (ref 2.5–7.0)

## 2018-01-23 LAB — SEDIMENTATION RATE: Sed Rate: 14 mm/h (ref 0–30)

## 2018-01-23 LAB — CK: Total CK: 215 U/L — ABNORMAL HIGH (ref 29–143)

## 2018-01-23 NOTE — Patient Instructions (Signed)
Hand Exercises Hand exercises can be helpful to almost anyone. These exercises can strengthen the hands, improve flexibility and movement, and increase blood flow to the hands. These results can make work and daily tasks easier. Hand exercises can be especially helpful for people who have joint pain from arthritis or have nerve damage from overuse (carpal tunnel syndrome). These exercises can also help people who have injured a hand. Most of these hand exercises are fairly gentle stretching routines. You can do them often throughout the day. Still, it is a good idea to ask your health care provider which exercises would be best for you. Warming your hands before exercise may help to reduce stiffness. You can do this with gentle massage or by placing your hands in warm water for 15 minutes. Also, make sure you pay attention to your level of hand pain as you begin an exercise routine. Exercises Knuckle Bend Repeat this exercise 5-10 times with each hand. 1. Stand or sit with your arm, hand, and all five fingers pointed straight up. Make sure your wrist is straight. 2. Gently and slowly bend your fingers down and inward until the tips of your fingers are touching the tops of your palm. 3. Hold this position for a few seconds. 4. Extend your fingers out to their original position, all pointing straight up again.  Finger Fan Repeat this exercise 5-10 times with each hand. 1. Hold your arm and hand out in front of you. Keep your wrist straight. 2. Squeeze your hand into a fist. 3. Hold this position for a few seconds. 4. Fan out, or spread apart, your hand and fingers as much as possible, stretching every joint fully.  Tabletop Repeat this exercise 5-10 times with each hand. 1. Stand or sit with your arm, hand, and all five fingers pointed straight up. Make sure your wrist is straight. 2. Gently and slowly bend your fingers at the knuckles where they meet the hand until your hand is making an  upside-down L shape. Your fingers should form a tabletop. 3. Hold this position for a few seconds. 4. Extend your fingers out to their original position, all pointing straight up again.  Making Os Repeat this exercise 5-10 times with each hand. 1. Stand or sit with your arm, hand, and all five fingers pointed straight up. Make sure your wrist is straight. 2. Make an O shape by touching your pointer finger to your thumb. Hold for a few seconds. Then open your hand wide. 3. Repeat this motion with each finger on your hand.  Table Spread Repeat this exercise 5-10 times with each hand. 1. Place your hand on a table with your palm facing down. Make sure your wrist is straight. 2. Spread your fingers out as much as possible. Hold this position for a few seconds. 3. Slide your fingers back together again. Hold for a few seconds.  Ball Grip  Repeat this exercise 10-15 times with each hand. 1. Hold a tennis ball or another soft ball in your hand. 2. While slowly increasing pressure, squeeze the ball as hard as possible. 3. Squeeze as hard as you can for 3-5 seconds. 4. Relax and repeat.  Wrist Curls Repeat this exercise 10-15 times with each hand. 1. Sit in a chair that has armrests. 2. Hold a light weight in your hand, such as a dumbbell that weighs 1-3 pounds (0.5-1.4 kg). Ask your health care provider what weight would be best for you. 3. Rest your hand just over   the end of the chair arm with your palm facing up. 4. Gently pivot your wrist up and down while holding the weight. Do not twist your wrist from side to side.  Contact a health care provider if:  Your hand pain or discomfort gets much worse when you do an exercise.  Your hand pain or discomfort does not improve within 2 hours after you exercise. If you have any of these problems, stop doing these exercises right away. Do not do them again unless your health care provider says that you can. Get help right away if:  You  develop sudden, severe hand pain. If this happens, stop doing these exercises right away. Do not do them again unless your health care provider says that you can. This information is not intended to replace advice given to you by your health care provider. Make sure you discuss any questions you have with your health care provider. Document Released: 08/29/2015 Document Revised: 02/23/2016 Document Reviewed: 03/28/2015 Elsevier Interactive Patient Education  2018 [employer] Knee Exercises Ask your health care provider which exercises are safe for you. Do exercises exactly as told by your health care provider and adjust them as directed. It is normal to feel mild stretching, pulling, tightness, or discomfort as you do these exercises, but you should stop right away if you feel sudden pain or your pain gets worse.Do not begin these exercises until told by your health care provider. STRETCHING AND RANGE OF MOTION EXERCISES These exercises warm up your muscles and joints and improve the movement and flexibility of your knee. These exercises also help to relieve pain, numbness, and tingling. Exercise A: Knee Extension, Prone 1. Lie on your abdomen on a bed. 2. Place your left / right knee just beyond the edge of the surface so your knee is not on the bed. You can put a towel under your left / right thigh just above your knee for comfort. 3. Relax your leg muscles and allow gravity to straighten your knee. You should feel a stretch behind your left / right knee. 4. Hold this position for __________ seconds. 5. Scoot up so your knee is supported between repetitions. Repeat __________ times. Complete this stretch __________ times a day. Exercise B: Knee Flexion, Active  1. Lie on your back with both knees straight. If this causes back discomfort, bend your left / right knee so your foot is flat on the floor. 2. Slowly slide your left / right heel back toward your buttocks until you feel a gentle  stretch in the front of your knee or thigh. 3. Hold this position for __________ seconds. 4. Slowly slide your left / right heel back to the starting position. Repeat __________ times. Complete this exercise __________ times a day. Exercise C: Quadriceps, Prone  1. Lie on your abdomen on a firm surface, such as a bed or padded floor. 2. Bend your left / right knee and hold your ankle. If you cannot reach your ankle or pant leg, loop a belt around your foot and grab the belt instead. 3. Gently pull your heel toward your buttocks. Your knee should not slide out to the side. You should feel a stretch in the front of your thigh and knee. 4. Hold this position for __________ seconds. Repeat __________ times. Complete this stretch __________ times a day. Exercise D: Hamstring, Supine 1. Lie on your back. 2. Loop a belt or towel over the ball of your left / right foot. The ball of   your foot is on the walking surface, right under your toes. 3. Straighten your left / right knee and slowly pull on the belt to raise your leg until you feel a gentle stretch behind your knee. ? Do not let your left / right knee bend while you do this. ? Keep your other leg flat on the floor. 4. Hold this position for __________ seconds. Repeat __________ times. Complete this stretch __________ times a day. STRENGTHENING EXERCISES These exercises build strength and endurance in your knee. Endurance is the ability to use your muscles for a long time, even after they get tired. Exercise E: Quadriceps, Isometric  1. Lie on your back with your left / right leg extended and your other knee bent. Put a rolled towel or small pillow under your knee if told by your health care provider. 2. Slowly tense the muscles in the front of your left / right thigh. You should see your kneecap slide up toward your hip or see increased dimpling just above the knee. This motion will push the back of the knee toward the floor. 3. For __________  seconds, keep the muscle as tight as you can without increasing your pain. 4. Relax the muscles slowly and completely. Repeat __________ times. Complete this exercise __________ times a day. Exercise F: Straight Leg Raises - Quadriceps 1. Lie on your back with your left / right leg extended and your other knee bent. 2. Tense the muscles in the front of your left / right thigh. You should see your kneecap slide up or see increased dimpling just above the knee. Your thigh may even shake a bit. 3. Keep these muscles tight as you raise your leg 4-6 inches (10-15 cm) off the floor. Do not let your knee bend. 4. Hold this position for __________ seconds. 5. Keep these muscles tense as you lower your leg. 6. Relax your muscles slowly and completely after each repetition. Repeat __________ times. Complete this exercise __________ times a day. Exercise G: Hamstring, Isometric 1. Lie on your back on a firm surface. 2. Bend your left / right knee approximately __________ degrees. 3. Dig your left / right heel into the surface as if you are trying to pull it toward your buttocks. Tighten the muscles in the back of your thighs to dig as hard as you can without increasing any pain. 4. Hold this position for __________ seconds. 5. Release the tension gradually and allow your muscles to relax completely for __________ seconds after each repetition. Repeat __________ times. Complete this exercise __________ times a day. Exercise H: Hamstring Curls  If told by your health care provider, do this exercise while wearing ankle weights. Begin with __________ weights. Then increase the weight by 1 lb (0.5 kg) increments. Do not wear ankle weights that are more than __________. 1. Lie on your abdomen with your legs straight. 2. Bend your left / right knee as far as you can without feeling pain. Keep your hips flat against the floor. 3. Hold this position for __________ seconds. 4. Slowly lower your leg to the  starting position.  Repeat __________ times. Complete this exercise __________ times a day. Exercise I: Squats (Quadriceps) 1. Stand in front of a table, with your feet and knees pointing straight ahead. You may rest your hands on the table for balance but not for support. 2. Slowly bend your knees and lower your hips like you are going to sit in a chair. ? Keep your weight over your heels,   not over your toes. ? Keep your lower legs upright so they are parallel with the table legs. ? Do not let your hips go lower than your knees. ? Do not bend lower than told by your health care provider. ? If your knee pain increases, do not bend as low. 3. Hold the squat position for __________ seconds. 4. Slowly push with your legs to return to standing. Do not use your hands to pull yourself to standing. Repeat __________ times. Complete this exercise __________ times a day. Exercise J: Wall Slides (Quadriceps)  1. Lean your back against a smooth wall or door while you walk your feet out 18-24 inches (46-61 cm) from it. 2. Place your feet hip-width apart. 3. Slowly slide down the wall or door until your knees bend __________ degrees. Keep your knees over your heels, not over your toes. Keep your knees in line with your hips. 4. Hold for __________ seconds. Repeat __________ times. Complete this exercise __________ times a day. Exercise K: Straight Leg Raises - Hip Abductors 1. Lie on your side with your left / right leg in the top position. Lie so your head, shoulder, knee, and hip line up. You may bend your bottom knee to help you keep your balance. 2. Roll your hips slightly forward so your hips are stacked directly over each other and your left / right knee is facing forward. 3. Leading with your heel, lift your top leg 4-6 inches (10-15 cm). You should feel the muscles in your outer hip lifting. ? Do not let your foot drift forward. ? Do not let your knee roll toward the ceiling. 4. Hold this  position for __________ seconds. 5. Slowly return your leg to the starting position. 6. Let your muscles relax completely after each repetition. Repeat __________ times. Complete this exercise __________ times a day. Exercise L: Straight Leg Raises - Hip Extensors 1. Lie on your abdomen on a firm surface. You can put a pillow under your hips if that is more comfortable. 2. Tense the muscles in your buttocks and lift your left / right leg about 4-6 inches (10-15 cm). Keep your knee straight as you lift your leg. 3. Hold this position for __________ seconds. 4. Slowly lower your leg to the starting position. 5. Let your leg relax completely after each repetition. Repeat __________ times. Complete this exercise __________ times a day. This information is not intended to replace advice given to you by your health care provider. Make sure you discuss any questions you have with your health care provider. Document Released: 08/01/2005 Document Revised: 06/11/2016 Document Reviewed: 07/24/2015 Elsevier Interactive Patient Education  2018 [employer] Inc[employer]  

## 2018-01-24 LAB — RHEUMATOID FACTOR

## 2018-01-24 LAB — URINALYSIS, ROUTINE W REFLEX MICROSCOPIC
BACTERIA UA: NONE SEEN /HPF
Bilirubin Urine: NEGATIVE
Glucose, UA: NEGATIVE
HGB URINE DIPSTICK: NEGATIVE
HYALINE CAST: NONE SEEN /LPF
Ketones, ur: NEGATIVE
Nitrite: NEGATIVE
PROTEIN: NEGATIVE
RBC / HPF: NONE SEEN /HPF (ref 0–2)
SQUAMOUS EPITHELIAL / LPF: NONE SEEN /HPF (ref <=5)
Specific Gravity, Urine: 1.006 (ref 1.001–1.03)
WBC UA: NONE SEEN /HPF (ref 0–5)
pH: 8 (ref 5.0–8.0)

## 2018-01-24 LAB — ANGIOTENSIN CONVERTING ENZYME: ANGIOTENSIN-CONVERTING ENZYME: 57 U/L (ref 9–67)

## 2018-01-24 LAB — CYCLIC CITRUL PEPTIDE ANTIBODY, IGG

## 2018-01-27 LAB — ANA: Anti Nuclear Antibody(ANA): NEGATIVE

## 2018-02-05 ENCOUNTER — Encounter: Payer: Self-pay | Admitting: *Deleted

## 2018-02-05 ENCOUNTER — Ambulatory Visit (INDEPENDENT_AMBULATORY_CARE_PROVIDER_SITE_OTHER): Payer: Self-pay

## 2018-02-05 ENCOUNTER — Ambulatory Visit (INDEPENDENT_AMBULATORY_CARE_PROVIDER_SITE_OTHER): Payer: Managed Care, Other (non HMO) | Admitting: Rheumatology

## 2018-02-05 DIAGNOSIS — M79642 Pain in left hand: Secondary | ICD-10-CM

## 2018-02-05 DIAGNOSIS — M79641 Pain in right hand: Secondary | ICD-10-CM

## 2018-02-05 DIAGNOSIS — M65332 Trigger finger, left middle finger: Secondary | ICD-10-CM

## 2018-02-05 MED ORDER — LIDOCAINE HCL 1 % IJ SOLN
0.5000 mL | INTRAMUSCULAR | Status: AC | PRN
  Administered 2018-02-05: .5 mL

## 2018-02-05 MED ORDER — TRIAMCINOLONE ACETONIDE 40 MG/ML IJ SUSP
10.0000 mg | INTRAMUSCULAR | Status: AC | PRN
  Administered 2018-02-05: 10 mg

## 2018-02-05 NOTE — Progress Notes (Signed)
   Procedure Note  Patient: Tanya [name]             Date of Birth: Apr 14, 1951           MRN: [id number]             Visit Date: 02/05/2018  Procedures: Visit Diagnoses: Pain in both hands - Plan: [place] Extrem Up Bilat Comp  Trigger middle finger of left hand  Hand/UE Inj for trigger finger on 02/05/2018 1:59 PM Indications: pain, tendon swelling and therapeutic Details: 27 G needle, ultrasound-guided volar approach Medications: 0.5 mL lidocaine 1 %; 10 mg triamcinolone acetonide 40 MG/ML Aspirate: 0 mL Outcome: tolerated well, no immediate complications Procedure, treatment alternatives, risks and benefits explained, specific risks discussed. Immediately prior to procedure a time out was called to verify the correct patient, procedure, equipment, support staff and site/side marked as required. Patient was prepped and draped in the usual sterile fashion.    Post procedure instructions were given.  A splint was applied to her left middle finger.  She was given work excuse for May 9 and May 10. Bo Merino, MD

## 2018-02-13 DIAGNOSIS — M19041 Primary osteoarthritis, right hand: Secondary | ICD-10-CM | POA: Insufficient documentation

## 2018-02-13 DIAGNOSIS — M170 Bilateral primary osteoarthritis of knee: Secondary | ICD-10-CM | POA: Insufficient documentation

## 2018-02-13 DIAGNOSIS — M19042 Primary osteoarthritis, left hand: Secondary | ICD-10-CM

## 2018-02-13 NOTE — Progress Notes (Deleted)
Office Visit Note  Patient: Tanya [name]             Date of Birth: May 19, 1951           MRN: [id number]             PCP: [name], MD Referring: [name], MD Visit Date: 02/20/2018 Occupation: _0 @    Subjective:  No chief complaint on file.   History of Present Illness: Tanya [name] is a 67 y.o. female ***   Activities of Daily Living:  Patient reports morning stiffness for *** {minute/hour:19697}.   Patient {ACTIONS;DENIES/REPORTS:21021675::"Denies"} nocturnal pain.  Difficulty dressing/grooming: {ACTIONS;DENIES/REPORTS:21021675::"Denies"} Difficulty climbing stairs: {ACTIONS;DENIES/REPORTS:21021675::"Denies"} Difficulty getting out of chair: {ACTIONS;DENIES/REPORTS:21021675::"Denies"} Difficulty using hands for taps, buttons, cutlery, and/or writing: {ACTIONS;DENIES/REPORTS:21021675::"Denies"}   No Rheumatology ROS completed.   PMFS History:  Patient Active Problem List   Diagnosis Date Noted  . Primary osteoarthritis of both hands 02/13/2018  . Primary osteoarthritis of both knees 02/13/2018  . Infected nail bed of toe 12/27/2017  . Rash and nonspecific skin eruption 12/16/2017  . Anxiety 02/06/2017  . Pruritus 07/20/2016  . Arthritis 07/20/2016  . Tubular adenoma of colon 02/11/2016  . Coronary artery calcification seen on CAT scan - Moderate LAD lesion noted 11/16/2014  . Abnormal CT scan, chest 11/08/2014  . IGT (impaired glucose tolerance) 06/11/2014  . Osteopenia 01/27/2014  . Microscopic hematuria 01/05/2013  . Urinary incontinence, mixed 01/05/2013  . GERD (gastroesophageal reflux disease) 12/16/2009  . Hyperlipemia 04/09/2008  . Essential hypertension 04/09/2008  . Insomnia disorder 04/09/2008    Past Medical History:  Diagnosis Date  . Anhedonia   . Cystitis   . Elevated liver enzymes   . Fatty liver   . GERD (gastroesophageal reflux disease)   . Hyperlipidemia   . Hypertension   . Insomnia   . Unspecified  hypothyroidism   . Vaginitis     Family History  Problem Relation Age of Onset  . Dementia Mother   . Heart disease Mother        heart attack   . Hypertension Mother   . Heart attack Father   . Heart disease Father        heart attack   . Hypertension Father    Past Surgical History:  Procedure Laterality Date  . COLONOSCOPY N/A 02/08/2016   Procedure: COLONOSCOPY;  Surgeon: [name], MD;  Location: [facility];  Service: Endoscopy;  Laterality: N/A;  1200 - moved to 11:45 - Ann to notify  . LEFT HEART CATHETERIZATION WITH CORONARY ANGIOGRAM N/A 11/19/2014   Procedure: LEFT HEART CATHETERIZATION WITH CORONARY ANGIOGRAM;  Surgeon: [name], MD;  Location: [facility] CATH LAB;  Service: Cardiovascular;  Laterality: N/A;   Social History   Social History Narrative   Pt originally from the [place] been in [place] for 20 years       Objective: Vital Signs: There were no vitals taken for this visit.   Physical Exam   Musculoskeletal Exam: ***  CDAI Exam: No CDAI exam completed.    Investigation: No additional findings. CBC Latest Ref Rng & Units 01/23/2018 02/06/2017 04/09/2016  WBC 3.8 - 10.8 Thousand/uL 6.1 5.0 6.0  Hemoglobin 11.7 - 15.5 g/dL 14.1 14.0 13.6  Hematocrit 35.0 - 45.0 % 41.5 40.4 39.8  Platelets 140 - 400 Thousand/uL 388 381 346   CMP     Component Value Date/Time   NA 140 01/23/2018 0941   K 3.9 01/23/2018 0941  CL 101 01/23/2018 0941   CO2 33 (H) 01/23/2018 0941   GLUCOSE 93 01/23/2018 0941   BUN 14 01/23/2018 0941   CREATININE 0.67 01/23/2018 0941   CALCIUM 10.5 (H) 01/23/2018 0941   PROT 8.0 01/23/2018 0941   ALBUMIN 4.5 02/06/2017 0934   AST 32 01/23/2018 0941   ALT 27 01/23/2018 0941   ALKPHOS 59 02/06/2017 0934   BILITOT 0.4 01/23/2018 0941   GFRNONAA 90 01/23/2018 0941   GFRAA 105 01/23/2018 0941  01/23/18 CK 215, ESR 14, UA - RF-, anti-CCP-, ANA-, uric acid 6.1, ACE57 Imaging: [place] Extrem Up Bilat Comp  Result Date:  02/05/2018 Ultrasound examination of bilateral hands was performed per EULAR recommendations. Using 12 MHz transducer, grayscale and power Doppler bilateral second, third, and fifth MCP joints and bilateral wrist joints both dorsal and volar aspects were evaluated to look for synovitis or tenosynovitis. The findings were there was no synovitis or tenosynovitis on ultrasound examination. Right median nerve was 0.14 cm squares which was within upper limits of normal and left median nerve was 0.13 cm squares which was more than upper limits of normal. Impression: Ultrasound examination did not show any synovitis.  Bilateral median nerves were enlarged.  Xr Hand 2 View Left  Result Date: 01/23/2018 PIP and DIP narrowing was noted.  No MCP changes were noted.  No intercarpal radiocarpal changes were noted.  No erosive changes were noted. Impression: These findings are consistent with osteoarthritis of the hand.  Xr Hand 2 View Right  Result Date: 01/23/2018 PIP and DIP narrowing was noted.  Subluxation of first and third PIP joints was noted.  No significant narrowing of the MCP joints intercarpal radiocarpal joints was noted.  No erosive changes were noted.  Mild juxta-articular osteopenia was noted. Impression: These findings are consistent with osteoarthritis.  Xr Knee 3 View Left  Result Date: 01/23/2018 Moderate medial compartment narrowing.  No chondrocalcinosis was noted.  Mild patellofemoral narrowing was noted. Impression - moderate osteoarthritis and mild chondromalacia patella.  Xr Knee 3 View Right  Result Date: 01/23/2018 Moderate medial compartment narrowing.  No chondrocalcinosis was noted.  Mild patellofemoral narrowing was noted. Impression - moderate osteoarthritis and mild chondromalacia patella.   Speciality Comments: No specialty comments available.    Procedures:  No procedures performed Allergies: Patient has no known allergies.   Assessment / Plan:     Visit Diagnoses:  Primary osteoarthritis of both hands - All autoimmune work-up negative, ultrasound negative for synovitis, bilateral median nerves enlarged.  Primary osteoarthritis of both knees - Bilateral moderate osteoarthritis and mild chondromalacia patella  Rash and nonspecific skin eruption -  history of rash off and on for the last 3 years.  She was seen by dermatologist Dr. Nevada Crane and was given some topical agents.  Anxiety  Essential hypertension  Gastroesophageal reflux disease  Hyperlipidemia    Orders: No orders of the defined types were placed in this encounter.  No orders of the defined types were placed in this encounter.   Face-to-face time spent with patient was *** minutes. 50% of time was spent in counseling and coordination of care.  Follow-Up Instructions: No follow-ups on file.   Bo Merino, MD  Note - This record has been created using Editor, commissioning.  Chart creation errors have been sought, but may not always  have been located. Such creation errors do not reflect on  the standard of medical care.

## 2018-02-20 ENCOUNTER — Ambulatory Visit: Payer: Self-pay | Admitting: Rheumatology

## 2018-03-06 ENCOUNTER — Ambulatory Visit: Payer: Managed Care, Other (non HMO) | Admitting: Family Medicine

## 2018-03-06 ENCOUNTER — Encounter: Payer: Self-pay | Admitting: Family Medicine

## 2018-03-06 VITALS — BP 168/80 | HR 64 | Resp 16 | Ht 64.0 in | Wt 138.0 lb

## 2018-03-06 DIAGNOSIS — G44219 Episodic tension-type headache, not intractable: Secondary | ICD-10-CM

## 2018-03-06 DIAGNOSIS — F419 Anxiety disorder, unspecified: Secondary | ICD-10-CM

## 2018-03-06 DIAGNOSIS — I10 Essential (primary) hypertension: Secondary | ICD-10-CM

## 2018-03-06 DIAGNOSIS — R51 Headache: Secondary | ICD-10-CM

## 2018-03-06 DIAGNOSIS — F5101 Primary insomnia: Secondary | ICD-10-CM | POA: Diagnosis not present

## 2018-03-06 MED ORDER — CLONIDINE HCL 0.1 MG PO TABS: ORAL_TABLET | ORAL | 3 refills | Status: DC

## 2018-03-06 MED ORDER — BUSPIRONE HCL 5 MG PO TABS: 5.0000 mg | ORAL_TABLET | Freq: Two times a day (BID) | ORAL | 3 refills | Status: DC

## 2018-03-06 MED ORDER — KETOROLAC TROMETHAMINE 60 MG/2ML IM SOLN
60.0000 mg | Freq: Once | INTRAMUSCULAR | Status: AC
  Administered 2018-03-06: 60 mg via INTRAMUSCULAR

## 2018-03-06 MED ORDER — BUTALBITAL-APAP-CAFFEINE 50-325-40 MG PO TABS: ORAL_TABLET | ORAL | 0 refills | Status: DC

## 2018-03-06 NOTE — Patient Instructions (Addendum)
F/U in 3       weeks, call if you need me sooner  Take an additional BP med clonidine one at bedtime Continue old medication for blood pressure New for anxiety is buspar one twice daily  Toradol 60 mg IM in office for headache and fioricet sent in     Work excuse for today and tomorrow, return on Monday.

## 2018-03-08 ENCOUNTER — Encounter: Payer: Self-pay | Admitting: Family Medicine

## 2018-03-08 DIAGNOSIS — R519 Headache, unspecified: Secondary | ICD-10-CM | POA: Insufficient documentation

## 2018-03-08 DIAGNOSIS — G43009 Migraine without aura, not intractable, without status migrainosus: Secondary | ICD-10-CM | POA: Insufficient documentation

## 2018-03-08 DIAGNOSIS — R51 Headache: Secondary | ICD-10-CM

## 2018-03-08 NOTE — Progress Notes (Signed)
   CRYSTOL [name]     MRN: [id number]      DOB: 04/05/1951   HPI [name] is here with c/o uncontrolled hypertension, states her bP has been fluctuating and yesterday at work when the nurse checked her BP she told her that she should go to her PCP because it was high Pt c/o worsened insomnia in the past several weeks, she has a niece in the [place] who she cares for as her own child, who she is very concerned about as she acting out and this has worsened her insomnia and causes severe anxiety, she is not interested in therapy, does not want to talk about her current situation at all, just wants to get some rest C/o headache , primarily frontal tightness , denies loss of vision or numbness   ROS Denies recent fever or chills. Denies sinus pressure, nasal congestion, ear pain or sore throat. Denies chest congestion, productive cough or wheezing. Denies chest pains, palpitations and leg swelling Denies abdominal pain, nausea, vomiting,diarrhea or constipation.   Denies dysuria, frequency, hesitancy or incontinence. Denies joint pain, swelling and limitation in mobility. Denies  seizures, numbness, or tingling.  Denies skin break down or rash.   PE  BP (!) 168/80   Pulse 64   Resp 16   Ht 5\' 4"  (1.626 m)   Wt 138 lb (62.6 kg)   SpO2 97%   BMI 23.69 kg/m   Patient alert and oriented and in no cardiopulmonary distress.  HEENT: No facial asymmetry, EOMI,   oropharynx pink and moist.  Neck supple no JVD, no mass.  Chest: Clear to auscultation bilaterally.  CVS: S1, S2 no murmurs, no S3.Regular rate.  ABD: Soft non tender.   Ext: No edema  MS: Adequate ROM spine, shoulders, hips and knees.  Skin: Intact, no ulcerations or rash noted.  Psych: Good eye contact,anxious. Memory intact tearful and  depressed appearing.  CNS: CN 2-12 intact, power,  normal throughout.no focal deficits noted.   Assessment & Plan  Essential hypertension Uncontrolled add clonidine at  bedtime DASH diet and commitment to daily physical activity for a minimum of 30 minutes discussed and encouraged, as a part of hypertension management. The importance of attaining a healthy weight is also discussed.  BP/Weight 03/06/2018 01/23/2018 12/16/2017 07/17/2017 02/06/2017 07/19/2016 03/16/736  Systolic BP 106 269 485 462 703 500 938  Diastolic BP 80 85 70 70 80 80 80  Wt. (Lbs) 138 141 144 138.75 142 140 135  BMI 23.69 24.2 24.72 23.82 24.37 24.03 23.17       Anxiety Uncontrolled , therapy encouraged, however pt feels even more stressed at the thoughts of verbalizing her current worries. STart buspar 1 tab 3 times daily, f/u in 2 months. Work excuse for 2 days  Insomnia disorder Sleep hygiene reviewed and written information offered also. Prescription sent for  medication needed. Uncontrolled currently due to excess worry and new stress   Headache Currently experiencing severe headache, Toradol 60 mg IM in office and limited fioricet for home use if needed

## 2018-03-08 NOTE — Assessment & Plan Note (Addendum)
Uncontrolled , therapy encouraged, however pt feels even more stressed at the thoughts of verbalizing her current worries. STart buspar 1 tab 2 times daily, f/u in 2 months. Work excuse for 2 days

## 2018-03-08 NOTE — Assessment & Plan Note (Signed)
Uncontrolled add clonidine at bedtime DASH diet and commitment to daily physical activity for a minimum of 30 minutes discussed and encouraged, as a part of hypertension management. The importance of attaining a healthy weight is also discussed.  BP/Weight 03/06/2018 01/23/2018 12/16/2017 07/17/2017 02/06/2017 07/19/2016 0/06/2329  Systolic BP 076 226 333 545 [phone]  Diastolic BP 80 85 70 70 80 80 80  Wt. (Lbs) 138 141 144 138.75 142 140 135  BMI 23.69 24.2 24.72 23.82 24.37 24.03 23.17

## 2018-03-08 NOTE — Assessment & Plan Note (Signed)
Sleep hygiene reviewed and written information offered also. Prescription sent for  medication needed. Uncontrolled currently due to excess worry and new stress

## 2018-03-08 NOTE — Assessment & Plan Note (Signed)
Currently experiencing severe headache, Toradol 60 mg IM in office and limited fioricet for home use if needed

## 2018-03-21 ENCOUNTER — Ambulatory Visit: Payer: Managed Care, Other (non HMO) | Admitting: Cardiovascular Disease

## 2018-03-21 ENCOUNTER — Encounter: Payer: Self-pay | Admitting: Cardiovascular Disease

## 2018-03-21 ENCOUNTER — Encounter

## 2018-03-21 VITALS — BP 134/70 | HR 72 | Ht 64.0 in | Wt 141.0 lb

## 2018-03-21 DIAGNOSIS — R079 Chest pain, unspecified: Secondary | ICD-10-CM

## 2018-03-21 DIAGNOSIS — E785 Hyperlipidemia, unspecified: Secondary | ICD-10-CM | POA: Diagnosis not present

## 2018-03-21 DIAGNOSIS — I25118 Atherosclerotic heart disease of native coronary artery with other forms of angina pectoris: Secondary | ICD-10-CM

## 2018-03-21 DIAGNOSIS — I10 Essential (primary) hypertension: Secondary | ICD-10-CM

## 2018-03-21 DIAGNOSIS — R0609 Other forms of dyspnea: Secondary | ICD-10-CM

## 2018-03-21 NOTE — Patient Instructions (Signed)
Medication Instructions:  Your physician recommends that you continue on your current medications as directed. Please refer to the Current Medication list given to you today.   Labwork: NONE  Testing/Procedures: Your physician has requested that you have en exercise stress myoview. For further information please visit [email or web address]. Please follow instruction sheet, as given.    Follow-Up: Your physician recommends that you schedule a follow-up appointment in: 6 WEEKS    Any Other Special Instructions Will Be Listed Below (If Applicable).     If you need a refill on your cardiac medications before your next appointment, please call your pharmacy.

## 2018-03-21 NOTE — Progress Notes (Signed)
CARDIOLOGY CONSULT NOTE  Patient ID: Tanya [name] MRN: [id number] DOB/AGE: 1951-01-18 67 y.o.  Admit date: (Not on file) Primary Physician: [name], MD Referring Physician: Dr. Moshe Cipro  Reason for Consultation: Coronary artery disease  HPI: Tanya [name] is a 67 y.o. female who is being seen today for the evaluation of coronary artery disease at the request of [name], MD.  Past medical history includes hypertension and hyperlipidemia.  She has a history of coronary artery disease.  Coronary angiography on 11/19/2014 demonstrated a smooth focal 50 to 60% stenosis in the mid LAD just prior to the takeoff of a large diagonal branch.  Left circumflex had mild luminal irregularities.  The RCA also had minimal luminal irregularities.  Pressure tracings during cardiac catheterization showed possibly mild aortic stenosis which was not seen on the echocardiogram.  There was also trivial to mild aortic insufficiency.  LVEDP was elevated at 19 mmHg suggesting diastolic dysfunction.  I saw her on 02/04/2015 and she was supposed to followed up with me 6 months afterwards but never came.  Imdur at that time did not provide much symptomatic benefit.  I reviewed the lipid panel performed on 07/26/2017: Total cholesterol 180, HDL 66, triglycerides 94, LDL 95.  She has been experiencing exertional dyspnea for some time but it has gotten a lot worse in the past 3 months.  She also describes chest heaviness primarily on the right side of her chest.  She describes nausea without vomiting, lightheadedness and dizziness, and constant headaches.  She denies palpitations, orthopnea, and leg swelling.  I personally reviewed the ECG performed today which demonstrates sinus bradycardia, 59 bpm, with no ischemic ST segment or T wave abnormalities, nor any arrhythmias.   No Known Allergies  Current Outpatient Medications  Medication Sig Dispense Refill  . aspirin 81 MG  tablet Take 81 mg by mouth daily.    . busPIRone (BUSPAR) 5 MG tablet Take 1 tablet (5 mg total) by mouth 2 (two) times daily. 60 tablet 3  . butalbital-acetaminophen-caffeine (FIORICET, ESGIC) 50-325-40 MG tablet One tablet twice diay as needed for headache . Maximum twice weekly 20 tablet 0  . cetirizine (ZYRTEC ALLERGY) 10 MG tablet Take 1 tablet (10 mg total) by mouth daily. 30 tablet 3  . cloNIDine (CATAPRES) 0.1 MG tablet One tablet at bedtime 30 tablet 3  . fish oil-omega-3 fatty acids 1000 MG capsule Take 1 g by mouth daily.     . Garlic 4650 MG CAPS Take 1 capsule by mouth daily.    . hydrOXYzine (VISTARIL) 100 MG capsule One tablet at bedtime for itching and for sleep 30 capsule 4  . Multiple Vitamins-Minerals (CENTRUM) tablet Take 1 tablet by mouth daily.    Marland Kitchen omeprazole (PRILOSEC) 20 MG capsule TAKE 1 CAPSULE DAILY 90 capsule 1  . Potassium Gluconate 595 MG CAPS Take 595 capsules by mouth daily.    . pravastatin (PRAVACHOL) 40 MG tablet TAKE 1 TABLET DAILY 90 tablet 1  . triamterene-hydrochlorothiazide (MAXZIDE-25) 37.5-25 MG tablet TAKE 1 TABLET DAILY 90 tablet 1  . vitamin C (ASCORBIC ACID) 500 MG tablet Take 500 mg by mouth daily.    Marland Kitchen VITAMIN E PO Take 1 tablet by mouth daily.     No current facility-administered medications for this visit.     Past Medical History:  Diagnosis Date  . Anhedonia   . Cystitis   . Elevated liver enzymes   . Fatty liver   .  GERD (gastroesophageal reflux disease)   . Hyperlipidemia   . Hypertension   . Insomnia   . Unspecified hypothyroidism   . Vaginitis     Past Surgical History:  Procedure Laterality Date  . COLONOSCOPY N/A 02/08/2016   Procedure: COLONOSCOPY;  Surgeon: [name], MD;  Location: [facility];  Service: Endoscopy;  Laterality: N/A;  1200 - moved to 11:45 - Ann to notify  . LEFT HEART CATHETERIZATION WITH CORONARY ANGIOGRAM N/A 11/19/2014   Procedure: LEFT HEART CATHETERIZATION WITH CORONARY ANGIOGRAM;  Surgeon:  [name], MD;  Location: [facility] CATH LAB;  Service: Cardiovascular;  Laterality: N/A;    Social History   Socioeconomic History  . Marital status: Married    Spouse name: Not on file  . Number of children: Not on file  . Years of education: Not on file  . Highest education level: Not on file  Occupational History  . Not on file  Social Needs  . Financial resource strain: Not on file  . Food insecurity:    Worry: Not on file    Inability: Not on file  . Transportation needs:    Medical: Not on file    Non-medical: Not on file  Tobacco Use  . Smoking status: Never Smoker  . Smokeless tobacco: Never Used  Substance and Sexual Activity  . Alcohol use: No    Alcohol/week: 0.0 oz  . Drug use: No  . Sexual activity: Not on file  Lifestyle  . Physical activity:    Days per week: Not on file    Minutes per session: Not on file  . Stress: Not on file  Relationships  . Social connections:    Talks on phone: Not on file    Gets together: Not on file    Attends religious service: Not on file    Active member of club or organization: Not on file    Attends meetings of clubs or organizations: Not on file    Relationship status: Not on file  . Intimate partner violence:    Fear of current or ex partner: Not on file    Emotionally abused: Not on file    Physically abused: Not on file    Forced sexual activity: Not on file  Other Topics Concern  . Not on file  Social History Narrative   Pt originally from the [place] been in [place] for 20 years       No family history of premature CAD in 1st degree relatives.  Current Meds  Medication Sig  . aspirin 81 MG tablet Take 81 mg by mouth daily.  . busPIRone (BUSPAR) 5 MG tablet Take 1 tablet (5 mg total) by mouth 2 (two) times daily.  . butalbital-acetaminophen-caffeine (FIORICET, ESGIC) 50-325-40 MG tablet One tablet twice diay as needed for headache . Maximum twice weekly  . cetirizine (ZYRTEC ALLERGY) 10 MG tablet Take 1  tablet (10 mg total) by mouth daily.  . cloNIDine (CATAPRES) 0.1 MG tablet One tablet at bedtime  . fish oil-omega-3 fatty acids 1000 MG capsule Take 1 g by mouth daily.   . Garlic 2536 MG CAPS Take 1 capsule by mouth daily.  . hydrOXYzine (VISTARIL) 100 MG capsule One tablet at bedtime for itching and for sleep  . Multiple Vitamins-Minerals (CENTRUM) tablet Take 1 tablet by mouth daily.  Marland Kitchen omeprazole (PRILOSEC) 20 MG capsule TAKE 1 CAPSULE DAILY  . Potassium Gluconate 595 MG CAPS Take 595 capsules by mouth daily.  . pravastatin (  PRAVACHOL) 40 MG tablet TAKE 1 TABLET DAILY  . triamterene-hydrochlorothiazide (MAXZIDE-25) 37.5-25 MG tablet TAKE 1 TABLET DAILY  . vitamin C (ASCORBIC ACID) 500 MG tablet Take 500 mg by mouth daily.  Marland Kitchen VITAMIN E PO Take 1 tablet by mouth daily.      Review of systems complete and found to be negative unless listed above in HPI    Physical exam Blood pressure 134/70, pulse 72, height 5\' 4"  (1.626 m), weight 141 lb (64 kg), SpO2 97 %. General: NAD Neck: No JVD, no thyromegaly or thyroid nodule.  Lungs: Clear to auscultation bilaterally with normal respiratory effort. CV: Nondisplaced PMI. Regular rate and rhythm, normal S1/S2, no S3/S4, no murmur.  No peripheral edema.  No carotid bruit.    Abdomen: Soft, nontender, no distention.  Skin: Intact without lesions or rashes.  Neurologic: Alert and oriented x 3.  Psych: Normal affect. Extremities: No clubbing or cyanosis.  HEENT: Normal.   ECG: Most recent ECG reviewed.   Labs: Lab Results  Component Value Date/Time   K 3.9 01/23/2018 09:41 AM   BUN 14 01/23/2018 09:41 AM   CREATININE 0.67 01/23/2018 09:41 AM   ALT 27 01/23/2018 09:41 AM   TSH 1.04 02/06/2017 09:34 AM   HGB 14.1 01/23/2018 09:41 AM     Lipids: Lab Results  Component Value Date/Time   LDLCALC 95 07/26/2017 09:25 AM   CHOL 180 07/26/2017 09:25 AM   TRIG 94 07/26/2017 09:25 AM   HDL 66 07/26/2017 09:25 AM        ASSESSMENT  AND PLAN:  1.  Coronary artery disease with LAD stenosis and progressive exertional dyspnea with chest heaviness: I am concerned that the LAD lesion seen in 2016 has become hemodynamically significant.  She is on aspirin and statin.  She has a resting bradycardia and thus I am unable to start beta-blockers.  Imdur was not helpful in the past.  I will arrange for an exercise Myoview stress test to evaluate for anterior wall ischemia.  2.  Hypertension: Blood pressures controlled.  No change to therapy.  3.  Hyperlipidemia: Lipids reviewed above.  Continue statin therapy.     Disposition: Follow up in 6 weeks.   Signed: [name], M.D., F.A.C.C.  03/21/2018, 1:51 PM

## 2018-03-28 ENCOUNTER — Encounter (HOSPITAL_COMMUNITY)
Admission: RE | Admit: 2018-03-28 | Discharge: 2018-03-28 | Disposition: A | Discharge status: Home / Self Care | Payer: Managed Care, Other (non HMO) | Source: Ambulatory Visit | Attending: Cardiovascular Disease | Admitting: Cardiovascular Disease

## 2018-03-28 ENCOUNTER — Encounter (HOSPITAL_BASED_OUTPATIENT_CLINIC_OR_DEPARTMENT_OTHER)
Admission: RE | Admit: 2018-03-28 | Discharge: 2018-03-28 | Disposition: A | Discharge status: Home / Self Care | Payer: Managed Care, Other (non HMO) | Source: Ambulatory Visit | Attending: Cardiovascular Disease | Admitting: Cardiovascular Disease

## 2018-03-28 ENCOUNTER — Encounter (HOSPITAL_COMMUNITY): Payer: Self-pay

## 2018-03-28 DIAGNOSIS — R0609 Other forms of dyspnea: Secondary | ICD-10-CM | POA: Insufficient documentation

## 2018-03-28 LAB — NM MYOCAR MULTI W/SPECT W/WALL MOTION / EF
CHL CUP NUCLEAR SRS: 0
CHL CUP NUCLEAR SSS: 1
CSEPED: 11 min
CSEPEW: 10.8 METS
Exercise duration (sec): 30 s
LHR: 0.25
LV dias vol: 63 mL (ref 46–106)
LV sys vol: 14 mL
MPHR: 152 {beats}/min
NUC STRESS TID: 1.15
Peak HR: 130 {beats}/min
Percent HR: 85 %
RPE: 13
Rest HR: 51 {beats}/min
SDS: 1

## 2018-03-28 MED ORDER — SODIUM CHLORIDE 0.9% FLUSH
INTRAVENOUS | Status: AC
  Administered 2018-03-28: 10 mL via INTRAVENOUS
  Filled 2018-03-28: qty 10

## 2018-03-28 MED ORDER — TECHNETIUM TC 99M TETROFOSMIN IV KIT
10.0000 | PACK | Freq: Once | INTRAVENOUS | Status: AC | PRN
  Administered 2018-03-28: 11 via INTRAVENOUS

## 2018-03-28 MED ORDER — TECHNETIUM TC 99M TETROFOSMIN IV KIT
30.0000 | PACK | Freq: Once | INTRAVENOUS | Status: AC | PRN
  Administered 2018-03-28: 30 via INTRAVENOUS

## 2018-03-28 MED ORDER — REGADENOSON 0.4 MG/5ML IV SOLN
INTRAVENOUS | Status: AC
  Filled 2018-03-28: qty 5

## 2018-03-29 ENCOUNTER — Other Ambulatory Visit: Payer: Self-pay | Admitting: Family Medicine

## 2018-04-16 ENCOUNTER — Ambulatory Visit: Payer: Managed Care, Other (non HMO) | Admitting: Cardiovascular Disease

## 2018-05-19 ENCOUNTER — Ambulatory Visit: Payer: Managed Care, Other (non HMO) | Admitting: Family Medicine

## 2018-05-30 ENCOUNTER — Ambulatory Visit: Payer: Managed Care, Other (non HMO) | Admitting: Cardiovascular Disease

## 2018-05-30 ENCOUNTER — Encounter: Payer: Self-pay | Admitting: *Deleted

## 2018-05-30 ENCOUNTER — Telehealth: Payer: Self-pay | Admitting: Cardiovascular Disease

## 2018-05-30 ENCOUNTER — Encounter: Payer: Self-pay | Admitting: Cardiovascular Disease

## 2018-05-30 ENCOUNTER — Other Ambulatory Visit: Payer: Self-pay | Admitting: Cardiovascular Disease

## 2018-05-30 VITALS — BP 127/74 | HR 73 | Ht 64.0 in | Wt 144.2 lb

## 2018-05-30 DIAGNOSIS — R079 Chest pain, unspecified: Secondary | ICD-10-CM | POA: Diagnosis not present

## 2018-05-30 DIAGNOSIS — R0609 Other forms of dyspnea: Secondary | ICD-10-CM | POA: Diagnosis not present

## 2018-05-30 DIAGNOSIS — Z01812 Encounter for preprocedural laboratory examination: Secondary | ICD-10-CM

## 2018-05-30 DIAGNOSIS — Z955 Presence of coronary angioplasty implant and graft: Secondary | ICD-10-CM

## 2018-05-30 DIAGNOSIS — I25118 Atherosclerotic heart disease of native coronary artery with other forms of angina pectoris: Secondary | ICD-10-CM

## 2018-05-30 DIAGNOSIS — E785 Hyperlipidemia, unspecified: Secondary | ICD-10-CM | POA: Diagnosis not present

## 2018-05-30 DIAGNOSIS — I10 Essential (primary) hypertension: Secondary | ICD-10-CM | POA: Diagnosis not present

## 2018-05-30 NOTE — Patient Instructions (Addendum)
Medication Instructions:  Continue all current medications.  Labwork: BMET, CBC - orders given today   Testing/Procedures: Your physician has requested that you have a cardiac catheterization. Cardiac catheterization is used to diagnose and/or treat various heart conditions. Doctors may recommend this procedure for a number of different reasons. The most common reason is to evaluate chest pain. Chest pain can be a symptom of coronary artery disease (CAD), and cardiac catheterization can show whether plaque is narrowing or blocking your heart's arteries. This procedure is also used to evaluate the valves, as well as measure the blood flow and oxygen levels in different parts of your heart. For further information please visit www.cardiosmart.org. Please follow instruction sheet, as given.   Follow-Up: 1 month   Any Other Special Instructions Will Be Listed Below (If Applicable).   If you need a refill on your cardiac medications before your next appointment, please call your pharmacy.  

## 2018-05-30 NOTE — H&P (View-Only) (Signed)
SUBJECTIVE: The patient returns for follow-up after undergoing cardiovascular testing performed for the evaluation of chest heaviness and exertional dyspnea.  Coronary angiography on 11/19/2014 demonstrated a smooth focal 50 to 60% stenosis in the mid LAD just prior to the takeoff of a large diagonal branch.  Left circumflex had mild luminal irregularities.  The RCA also had minimal luminal irregularities.  Pressure tracings during cardiac catheterization showed possibly mild aortic stenosis which was not seen on the echocardiogram.  There was also trivial to mild aortic insufficiency.  LVEDP was elevated at 19 mmHg suggesting diastolic dysfunction.  Nuclear stress test on 03/28/2018 was low risk.  [facility] treadmill score was 11.  She had excellent exercise tolerance and exercised for 11 minutes.  Left ventricular ejection fraction was hyperdynamic.  Blood pressure demonstrated a hypertensive response to exercise.  She continues to experience exertional dyspnea when climbing stairs or pushing the lawnmower and has occasional chest tightness associated with this.  She has been trying to walk on a treadmill and increase the amount of exercise that she does.  She never smoked.  She was not exposed to secondhand smoke.  She denies wheezing.  She seldom coughs.  She denies leg swelling, orthopnea, and paroxysmal nocturnal dyspnea.     Review of Systems: As per "subjective", otherwise negative.  No Known Allergies  Current Outpatient Medications  Medication Sig Dispense Refill  . aspirin 81 MG tablet Take 81 mg by mouth daily.    . busPIRone (BUSPAR) 5 MG tablet Take 1 tablet (5 mg total) by mouth 2 (two) times daily. 60 tablet 3  . butalbital-acetaminophen-caffeine (FIORICET, ESGIC) 50-325-40 MG tablet One tablet twice diay as needed for headache . Maximum twice weekly 20 tablet 0  . cetirizine (ZYRTEC ALLERGY) 10 MG tablet Take 1 tablet (10 mg total) by mouth daily. 30 tablet 3  .  cloNIDine (CATAPRES) 0.1 MG tablet One tablet at bedtime 30 tablet 3  . fish oil-omega-3 fatty acids 1000 MG capsule Take 1 g by mouth daily.     . Garlic 1740 MG CAPS Take 1 capsule by mouth daily.    . hydrOXYzine (VISTARIL) 100 MG capsule One tablet at bedtime for itching and for sleep 30 capsule 4  . Multiple Vitamins-Minerals (CENTRUM) tablet Take 1 tablet by mouth daily.    Marland Kitchen omeprazole (PRILOSEC) 20 MG capsule TAKE 1 CAPSULE DAILY 90 capsule 0  . Potassium Gluconate 595 MG CAPS Take 595 capsules by mouth daily.    . pravastatin (PRAVACHOL) 40 MG tablet TAKE 1 TABLET DAILY 90 tablet 0  . triamterene-hydrochlorothiazide (MAXZIDE-25) 37.5-25 MG tablet TAKE 1 TABLET DAILY 90 tablet 0  . vitamin C (ASCORBIC ACID) 500 MG tablet Take 500 mg by mouth daily.    Marland Kitchen VITAMIN E PO Take 1 tablet by mouth daily.     No current facility-administered medications for this visit.     Past Medical History:  Diagnosis Date  . Anhedonia   . Cystitis   . Elevated liver enzymes   . Fatty liver   . GERD (gastroesophageal reflux disease)   . Hyperlipidemia   . Hypertension   . Insomnia   . Unspecified hypothyroidism   . Vaginitis     Past Surgical History:  Procedure Laterality Date  . COLONOSCOPY N/A 02/08/2016   Procedure: COLONOSCOPY;  Surgeon: [name], MD;  Location: [facility];  Service: Endoscopy;  Laterality: N/A;  1200 - moved to 11:45 - Ann to notify  .  LEFT HEART CATHETERIZATION WITH CORONARY ANGIOGRAM N/A 11/19/2014   Procedure: LEFT HEART CATHETERIZATION WITH CORONARY ANGIOGRAM;  Surgeon: [name], MD;  Location: [facility] CATH LAB;  Service: Cardiovascular;  Laterality: N/A;    Social History   Socioeconomic History  . Marital status: Married    Spouse name: Not on file  . Number of children: Not on file  . Years of education: Not on file  . Highest education level: Not on file  Occupational History  . Not on file  Social Needs  . Financial resource strain: Not on  file  . Food insecurity:    Worry: Not on file    Inability: Not on file  . Transportation needs:    Medical: Not on file    Non-medical: Not on file  Tobacco Use  . Smoking status: Never Smoker  . Smokeless tobacco: Never Used  Substance and Sexual Activity  . Alcohol use: No    Alcohol/week: 0.0 standard drinks  . Drug use: No  . Sexual activity: Not on file  Lifestyle  . Physical activity:    Days per week: Not on file    Minutes per session: Not on file  . Stress: Not on file  Relationships  . Social connections:    Talks on phone: Not on file    Gets together: Not on file    Attends religious service: Not on file    Active member of club or organization: Not on file    Attends meetings of clubs or organizations: Not on file    Relationship status: Not on file  . Intimate partner violence:    Fear of current or ex partner: Not on file    Emotionally abused: Not on file    Physically abused: Not on file    Forced sexual activity: Not on file  Other Topics Concern  . Not on file  Social History Narrative   Pt originally from the [place] been in [place] for 20 years       Vitals:   05/30/18 1341  BP: 127/74  Pulse: 73  SpO2: 98%  Weight: 144 lb 3.2 oz (65.4 kg)  Height: 5\' 4"  (1.626 m)    Wt Readings from Last 3 Encounters:  05/30/18 144 lb 3.2 oz (65.4 kg)  03/21/18 141 lb (64 kg)  03/06/18 138 lb (62.6 kg)     PHYSICAL EXAM General: NAD HEENT: Normal. Neck: No JVD, no thyromegaly. Lungs: Clear to auscultation bilaterally with normal respiratory effort. CV: Regular rate and rhythm, normal S1/S2, no S3/S4, no murmur. No pretibial or periankle edema.  No carotid bruit.   Abdomen: Soft, nontender, no distention.  Neurologic: Alert and oriented.  Psych: Normal affect. Skin: Normal. Musculoskeletal: No gross deformities.    ECG: Reviewed above under Subjective   Labs: Lab Results  Component Value Date/Time   K 3.9 01/23/2018 09:41 AM   BUN 14  01/23/2018 09:41 AM   CREATININE 0.67 01/23/2018 09:41 AM   ALT 27 01/23/2018 09:41 AM   TSH 1.04 02/06/2017 09:34 AM   HGB 14.1 01/23/2018 09:41 AM     Lipids: Lab Results  Component Value Date/Time   LDLCALC 95 07/26/2017 09:25 AM   CHOL 180 07/26/2017 09:25 AM   TRIG 94 07/26/2017 09:25 AM   HDL 66 07/26/2017 09:25 AM       ASSESSMENT AND PLAN:  1.  Coronary artery disease with LAD stenosis and progressive exertional dyspnea with chest heaviness: I am concerned that the  LAD lesion seen in 2016 has become hemodynamically significant and symptoms are indicative of angina pectoris.  She is on aspirin and statin.  She previously had a resting bradycardia and thus I am unable to start beta-blockers.  Imdur was not helpful in the past.  Nuclear stress test was normal as noted above.  I will arrange for right and left heart catheterization and coronary angiography. Risks and benefits of cardiac catheterization have been discussed with the patient.  These include bleeding, infection, kidney damage, stroke, heart attack, death.  The patient understands these risks and is willing to proceed.  2.  Hypertension: Blood pressure is controlled.  No change to therapy.  3.  Hyperlipidemia: Lipids previously reviewed.  Continue statin therapy.   Disposition: Follow up 1 month  A high level of decision making was required for increased medical complexities.    [name], M.D., F.A.C.C.

## 2018-05-30 NOTE — Progress Notes (Signed)
SUBJECTIVE: The patient returns for follow-up after undergoing cardiovascular testing performed for the evaluation of chest heaviness and exertional dyspnea.  Coronary angiography on 11/19/2014 demonstrated a smooth focal 50 to 60% stenosis in the mid LAD just prior to the takeoff of a large diagonal branch.  Left circumflex had mild luminal irregularities.  The RCA also had minimal luminal irregularities.  Pressure tracings during cardiac catheterization showed possibly mild aortic stenosis which was not seen on the echocardiogram.  There was also trivial to mild aortic insufficiency.  LVEDP was elevated at 19 mmHg suggesting diastolic dysfunction.  Nuclear stress test on 03/28/2018 was low risk.  [facility] treadmill score was 11.  She had excellent exercise tolerance and exercised for 11 minutes.  Left ventricular ejection fraction was hyperdynamic.  Blood pressure demonstrated a hypertensive response to exercise.  She continues to experience exertional dyspnea when climbing stairs or pushing the lawnmower and has occasional chest tightness associated with this.  She has been trying to walk on a treadmill and increase the amount of exercise that she does.  She never smoked.  She was not exposed to secondhand smoke.  She denies wheezing.  She seldom coughs.  She denies leg swelling, orthopnea, and paroxysmal nocturnal dyspnea.     Review of Systems: As per "subjective", otherwise negative.  No Known Allergies  Current Outpatient Medications  Medication Sig Dispense Refill  . aspirin 81 MG tablet Take 81 mg by mouth daily.    . busPIRone (BUSPAR) 5 MG tablet Take 1 tablet (5 mg total) by mouth 2 (two) times daily. 60 tablet 3  . butalbital-acetaminophen-caffeine (FIORICET, ESGIC) 50-325-40 MG tablet One tablet twice diay as needed for headache . Maximum twice weekly 20 tablet 0  . cetirizine (ZYRTEC ALLERGY) 10 MG tablet Take 1 tablet (10 mg total) by mouth daily. 30 tablet 3  .  cloNIDine (CATAPRES) 0.1 MG tablet One tablet at bedtime 30 tablet 3  . fish oil-omega-3 fatty acids 1000 MG capsule Take 1 g by mouth daily.     . Garlic 3762 MG CAPS Take 1 capsule by mouth daily.    . hydrOXYzine (VISTARIL) 100 MG capsule One tablet at bedtime for itching and for sleep 30 capsule 4  . Multiple Vitamins-Minerals (CENTRUM) tablet Take 1 tablet by mouth daily.    Marland Kitchen omeprazole (PRILOSEC) 20 MG capsule TAKE 1 CAPSULE DAILY 90 capsule 0  . Potassium Gluconate 595 MG CAPS Take 595 capsules by mouth daily.    . pravastatin (PRAVACHOL) 40 MG tablet TAKE 1 TABLET DAILY 90 tablet 0  . triamterene-hydrochlorothiazide (MAXZIDE-25) 37.5-25 MG tablet TAKE 1 TABLET DAILY 90 tablet 0  . vitamin C (ASCORBIC ACID) 500 MG tablet Take 500 mg by mouth daily.    Marland Kitchen VITAMIN E PO Take 1 tablet by mouth daily.     No current facility-administered medications for this visit.     Past Medical History:  Diagnosis Date  . Anhedonia   . Cystitis   . Elevated liver enzymes   . Fatty liver   . GERD (gastroesophageal reflux disease)   . Hyperlipidemia   . Hypertension   . Insomnia   . Unspecified hypothyroidism   . Vaginitis     Past Surgical History:  Procedure Laterality Date  . COLONOSCOPY N/A 02/08/2016   Procedure: COLONOSCOPY;  Surgeon: [name], MD;  Location: [facility];  Service: Endoscopy;  Laterality: N/A;  1200 - moved to 11:45 - Ann to notify  .  LEFT HEART CATHETERIZATION WITH CORONARY ANGIOGRAM N/A 11/19/2014   Procedure: LEFT HEART CATHETERIZATION WITH CORONARY ANGIOGRAM;  Surgeon: [name], MD;  Location: [facility] CATH LAB;  Service: Cardiovascular;  Laterality: N/A;    Social History   Socioeconomic History  . Marital status: Married    Spouse name: Not on file  . Number of children: Not on file  . Years of education: Not on file  . Highest education level: Not on file  Occupational History  . Not on file  Social Needs  . Financial resource strain: Not on  file  . Food insecurity:    Worry: Not on file    Inability: Not on file  . Transportation needs:    Medical: Not on file    Non-medical: Not on file  Tobacco Use  . Smoking status: Never Smoker  . Smokeless tobacco: Never Used  Substance and Sexual Activity  . Alcohol use: No    Alcohol/week: 0.0 standard drinks  . Drug use: No  . Sexual activity: Not on file  Lifestyle  . Physical activity:    Days per week: Not on file    Minutes per session: Not on file  . Stress: Not on file  Relationships  . Social connections:    Talks on phone: Not on file    Gets together: Not on file    Attends religious service: Not on file    Active member of club or organization: Not on file    Attends meetings of clubs or organizations: Not on file    Relationship status: Not on file  . Intimate partner violence:    Fear of current or ex partner: Not on file    Emotionally abused: Not on file    Physically abused: Not on file    Forced sexual activity: Not on file  Other Topics Concern  . Not on file  Social History Narrative   Pt originally from the [place] been in [place] for 20 years       Vitals:   05/30/18 1341  BP: 127/74  Pulse: 73  SpO2: 98%  Weight: 144 lb 3.2 oz (65.4 kg)  Height: 5\' 4"  (1.626 m)    Wt Readings from Last 3 Encounters:  05/30/18 144 lb 3.2 oz (65.4 kg)  03/21/18 141 lb (64 kg)  03/06/18 138 lb (62.6 kg)     PHYSICAL EXAM General: NAD HEENT: Normal. Neck: No JVD, no thyromegaly. Lungs: Clear to auscultation bilaterally with normal respiratory effort. CV: Regular rate and rhythm, normal S1/S2, no S3/S4, no murmur. No pretibial or periankle edema.  No carotid bruit.   Abdomen: Soft, nontender, no distention.  Neurologic: Alert and oriented.  Psych: Normal affect. Skin: Normal. Musculoskeletal: No gross deformities.    ECG: Reviewed above under Subjective   Labs: Lab Results  Component Value Date/Time   K 3.9 01/23/2018 09:41 AM   BUN 14  01/23/2018 09:41 AM   CREATININE 0.67 01/23/2018 09:41 AM   ALT 27 01/23/2018 09:41 AM   TSH 1.04 02/06/2017 09:34 AM   HGB 14.1 01/23/2018 09:41 AM     Lipids: Lab Results  Component Value Date/Time   LDLCALC 95 07/26/2017 09:25 AM   CHOL 180 07/26/2017 09:25 AM   TRIG 94 07/26/2017 09:25 AM   HDL 66 07/26/2017 09:25 AM       ASSESSMENT AND PLAN:  1.  Coronary artery disease with LAD stenosis and progressive exertional dyspnea with chest heaviness: I am concerned that the  LAD lesion seen in 2016 has become hemodynamically significant and symptoms are indicative of angina pectoris.  She is on aspirin and statin.  She previously had a resting bradycardia and thus I am unable to start beta-blockers.  Imdur was not helpful in the past.  Nuclear stress test was normal as noted above.  I will arrange for right and left heart catheterization and coronary angiography. Risks and benefits of cardiac catheterization have been discussed with the patient.  These include bleeding, infection, kidney damage, stroke, heart attack, death.  The patient understands these risks and is willing to proceed.  2.  Hypertension: Blood pressure is controlled.  No change to therapy.  3.  Hyperlipidemia: Lipids previously reviewed.  Continue statin therapy.   Disposition: Follow up 1 month  A high level of decision making was required for increased medical complexities.    [name], M.D., F.A.C.C.

## 2018-05-30 NOTE — Telephone Encounter (Signed)
Pre-cert Verification for the following procedure   Left & Right heart cath - Friday, 9/6 - 9:00 - [name]

## 2018-06-03 ENCOUNTER — Other Ambulatory Visit: Payer: Self-pay | Admitting: Cardiovascular Disease

## 2018-06-03 LAB — BASIC METABOLIC PANEL WITH GFR
BUN: 10 mg/dL (ref 7–25)
CHLORIDE: 100 mmol/L (ref 98–110)
CO2: 33 mmol/L — AB (ref 20–32)
CREATININE: 0.82 mg/dL (ref 0.50–0.99)
Calcium: 9.7 mg/dL (ref 8.6–10.4)
GFR, Est African American: 85 mL/min/{1.73_m2} (ref >=60)
GFR, Est Non African American: 74 mL/min/{1.73_m2} (ref >=60)
GLUCOSE: 94 mg/dL (ref 65–99)
Potassium: 3.5 mmol/L (ref 3.5–5.3)
SODIUM: 139 mmol/L (ref 135–146)

## 2018-06-03 LAB — CBC
HEMATOCRIT: 38 % (ref 35.0–45.0)
HEMOGLOBIN: 12.9 g/dL (ref 11.7–15.5)
MCH: 30.8 pg (ref 27.0–33.0)
MCHC: 33.9 g/dL (ref 32.0–36.0)
MCV: 90.7 fL (ref 80.0–100.0)
MPV: 8.7 fL (ref 7.5–12.5)
Platelets: 374 10*3/uL (ref 140–400)
RBC: 4.19 10*6/uL (ref 3.80–5.10)
RDW: 13.4 % (ref 11.0–15.0)
WBC: 5.6 10*3/uL (ref 3.8–10.8)

## 2018-06-06 ENCOUNTER — Other Ambulatory Visit: Payer: Self-pay

## 2018-06-06 ENCOUNTER — Encounter (HOSPITAL_COMMUNITY)
Admission: RE | Disposition: A | Discharge status: Home / Self Care | Payer: Self-pay | Source: Ambulatory Visit | Attending: Cardiology

## 2018-06-06 ENCOUNTER — Ambulatory Visit (HOSPITAL_COMMUNITY)
Admission: RE | Admit: 2018-06-06 | Discharge: 2018-06-06 | Disposition: A | Discharge status: Home / Self Care | Payer: Managed Care, Other (non HMO) | Source: Ambulatory Visit | Attending: Cardiology | Admitting: Cardiology

## 2018-06-06 DIAGNOSIS — Z955 Presence of coronary angioplasty implant and graft: Secondary | ICD-10-CM

## 2018-06-06 DIAGNOSIS — I2510 Atherosclerotic heart disease of native coronary artery without angina pectoris: Secondary | ICD-10-CM | POA: Diagnosis not present

## 2018-06-06 DIAGNOSIS — G4700 Insomnia, unspecified: Secondary | ICD-10-CM | POA: Insufficient documentation

## 2018-06-06 DIAGNOSIS — K219 Gastro-esophageal reflux disease without esophagitis: Secondary | ICD-10-CM | POA: Insufficient documentation

## 2018-06-06 DIAGNOSIS — I25118 Atherosclerotic heart disease of native coronary artery with other forms of angina pectoris: Secondary | ICD-10-CM

## 2018-06-06 DIAGNOSIS — I10 Essential (primary) hypertension: Secondary | ICD-10-CM | POA: Diagnosis not present

## 2018-06-06 DIAGNOSIS — E785 Hyperlipidemia, unspecified: Secondary | ICD-10-CM | POA: Diagnosis not present

## 2018-06-06 DIAGNOSIS — K760 Fatty (change of) liver, not elsewhere classified: Secondary | ICD-10-CM | POA: Insufficient documentation

## 2018-06-06 DIAGNOSIS — E039 Hypothyroidism, unspecified: Secondary | ICD-10-CM | POA: Insufficient documentation

## 2018-06-06 DIAGNOSIS — R0609 Other forms of dyspnea: Secondary | ICD-10-CM | POA: Diagnosis present

## 2018-06-06 DIAGNOSIS — I25119 Atherosclerotic heart disease of native coronary artery with unspecified angina pectoris: Secondary | ICD-10-CM | POA: Diagnosis not present

## 2018-06-06 DIAGNOSIS — Z7982 Long term (current) use of aspirin: Secondary | ICD-10-CM | POA: Insufficient documentation

## 2018-06-06 HISTORY — PX: CORONARY STENT INTERVENTION: CATH118234

## 2018-06-06 HISTORY — PX: RIGHT/LEFT HEART CATH AND CORONARY ANGIOGRAPHY: CATH118266

## 2018-06-06 HISTORY — PX: INTRAVASCULAR PRESSURE WIRE/FFR STUDY: CATH118243

## 2018-06-06 LAB — POCT I-STAT 3, VENOUS BLOOD GAS (G3P V)
Acid-Base Excess: 2 mmol/L (ref 0.0–2.0)
Bicarbonate: 27.9 mmol/L (ref 20.0–28.0)
O2 SAT: 76 %
PCO2 VEN: 47.9 mmHg (ref 44.0–60.0)
TCO2: 29 mmol/L (ref 22–32)
pH, Ven: 7.374 (ref 7.250–7.430)
pO2, Ven: 43 mmHg (ref 32.0–45.0)

## 2018-06-06 LAB — POCT I-STAT 3, ART BLOOD GAS (G3+)
ACID-BASE EXCESS: 4 mmol/L — AB (ref 0.0–2.0)
BICARBONATE: 29.2 mmol/L — AB (ref 20.0–28.0)
O2 SAT: 99 %
TCO2: 31 mmol/L (ref 22–32)
pCO2 arterial: 46 mmHg (ref 32.0–48.0)
pH, Arterial: 7.41 (ref 7.350–7.450)
pO2, Arterial: 158 mmHg — ABNORMAL HIGH (ref 83.0–108.0)

## 2018-06-06 LAB — POCT ACTIVATED CLOTTING TIME
ACTIVATED CLOTTING TIME: 252 s
Activated Clotting Time: 329 seconds
Activated Clotting Time: 213 seconds

## 2018-06-06 SURGERY — RIGHT/LEFT HEART CATH AND CORONARY ANGIOGRAPHY
Anesthesia: LOCAL

## 2018-06-06 MED ORDER — NITROGLYCERIN 1 MG/10 ML FOR IR/CATH LAB
INTRA_ARTERIAL | Status: AC
  Filled 2018-06-06: qty 10

## 2018-06-06 MED ORDER — MIDAZOLAM HCL 2 MG/2ML IJ SOLN
INTRAMUSCULAR | Status: DC | PRN
  Administered 2018-06-06 (×2): 1 mg via INTRAVENOUS

## 2018-06-06 MED ORDER — CLOPIDOGREL BISULFATE 75 MG PO TABS: 75.0000 mg | ORAL_TABLET | Freq: Every day | ORAL | 0 refills | Status: DC

## 2018-06-06 MED ORDER — ANGIOPLASTY BOOK
Freq: Once | Status: AC
  Administered 2018-06-06: 17:00:00
  Filled 2018-06-06 (×2): qty 1

## 2018-06-06 MED ORDER — CLONIDINE HCL 0.1 MG PO TABS
0.1000 mg | ORAL_TABLET | Freq: Once | ORAL | Status: AC
  Administered 2018-06-06: 0.1 mg via ORAL

## 2018-06-06 MED ORDER — VERAPAMIL HCL 2.5 MG/ML IV SOLN
INTRAVENOUS | Status: DC | PRN
  Administered 2018-06-06: 10 mL via INTRA_ARTERIAL

## 2018-06-06 MED ORDER — IOPAMIDOL (ISOVUE-370) INJECTION 76%
INTRAVENOUS | Status: AC
  Filled 2018-06-06: qty 50

## 2018-06-06 MED ORDER — SODIUM CHLORIDE 0.9 % WEIGHT BASED INFUSION: 1.0000 mL/kg/h | INTRAVENOUS | Status: AC

## 2018-06-06 MED ORDER — LIDOCAINE HCL (PF) 1 % IJ SOLN
INTRAMUSCULAR | Status: AC
  Filled 2018-06-06: qty 30

## 2018-06-06 MED ORDER — SODIUM CHLORIDE 0.9% FLUSH: 3.0000 mL | Freq: Two times a day (BID) | INTRAVENOUS | Status: DC

## 2018-06-06 MED ORDER — CLOPIDOGREL BISULFATE 300 MG PO TABS
ORAL_TABLET | ORAL | Status: AC
  Filled 2018-06-06: qty 2

## 2018-06-06 MED ORDER — HEPARIN (PORCINE) IN NACL 1000-0.9 UT/500ML-% IV SOLN
INTRAVENOUS | Status: DC | PRN
  Administered 2018-06-06 (×2): 500 mL

## 2018-06-06 MED ORDER — IOPAMIDOL (ISOVUE-370) INJECTION 76%
INTRAVENOUS | Status: DC | PRN
  Administered 2018-06-06: 150 mL via INTRA_ARTERIAL

## 2018-06-06 MED ORDER — IOPAMIDOL (ISOVUE-370) INJECTION 76%
INTRAVENOUS | Status: AC
  Filled 2018-06-06: qty 100

## 2018-06-06 MED ORDER — FENTANYL CITRATE (PF) 100 MCG/2ML IJ SOLN
INTRAMUSCULAR | Status: AC
  Filled 2018-06-06: qty 2

## 2018-06-06 MED ORDER — HYDRALAZINE HCL 20 MG/ML IJ SOLN
INTRAMUSCULAR | Status: AC
  Administered 2018-06-06: 5 mg via INTRAVENOUS
  Filled 2018-06-06: qty 1

## 2018-06-06 MED ORDER — ATORVASTATIN CALCIUM 80 MG PO TABS: 80.0000 mg | ORAL_TABLET | Freq: Every day | ORAL | Status: DC

## 2018-06-06 MED ORDER — SODIUM CHLORIDE 0.9% FLUSH: 3.0000 mL | INTRAVENOUS | Status: DC | PRN

## 2018-06-06 MED ORDER — ATORVASTATIN CALCIUM 80 MG PO TABS: 80.0000 mg | ORAL_TABLET | Freq: Every day | ORAL | 1 refills | Status: DC

## 2018-06-06 MED ORDER — SODIUM CHLORIDE 0.9 % IV SOLN: 250.0000 mL | INTRAVENOUS | Status: DC | PRN

## 2018-06-06 MED ORDER — VERAPAMIL HCL 2.5 MG/ML IV SOLN
INTRAVENOUS | Status: AC
  Filled 2018-06-06: qty 2

## 2018-06-06 MED ORDER — FENTANYL CITRATE (PF) 100 MCG/2ML IJ SOLN
INTRAMUSCULAR | Status: DC | PRN
  Administered 2018-06-06 (×2): 25 ug via INTRAVENOUS

## 2018-06-06 MED ORDER — HEPARIN SODIUM (PORCINE) 1000 UNIT/ML IJ SOLN
INTRAMUSCULAR | Status: AC
  Filled 2018-06-06: qty 1

## 2018-06-06 MED ORDER — PANTOPRAZOLE SODIUM 40 MG PO TBEC: 40.0000 mg | DELAYED_RELEASE_TABLET | Freq: Every day | ORAL | 1 refills | Status: DC

## 2018-06-06 MED ORDER — MIDAZOLAM HCL 2 MG/2ML IJ SOLN
INTRAMUSCULAR | Status: AC
  Filled 2018-06-06: qty 2

## 2018-06-06 MED ORDER — SODIUM CHLORIDE 0.9 % WEIGHT BASED INFUSION: 1.0000 mL/kg/h | INTRAVENOUS | Status: DC

## 2018-06-06 MED ORDER — ADENOSINE 12 MG/4ML IV SOLN
INTRAVENOUS | Status: AC
  Filled 2018-06-06: qty 16

## 2018-06-06 MED ORDER — ADENOSINE (DIAGNOSTIC) 140MCG/KG/MIN
INTRAVENOUS | Status: DC | PRN
  Administered 2018-06-06: 140 ug/kg/min via INTRAVENOUS

## 2018-06-06 MED ORDER — CLONIDINE HCL 0.2 MG PO TABS
ORAL_TABLET | ORAL | Status: AC
  Administered 2018-06-06: 0.1 mg via ORAL
  Filled 2018-06-06: qty 1

## 2018-06-06 MED ORDER — NITROGLYCERIN 1 MG/10 ML FOR IR/CATH LAB
INTRA_ARTERIAL | Status: DC | PRN
  Administered 2018-06-06 (×2): 200 ug via INTRACORONARY

## 2018-06-06 MED ORDER — CLOPIDOGREL BISULFATE 300 MG PO TABS
ORAL_TABLET | ORAL | Status: DC | PRN
  Administered 2018-06-06: 600 mg via ORAL

## 2018-06-06 MED ORDER — NITROGLYCERIN 0.4 MG SL SUBL: 0.4000 mg | SUBLINGUAL_TABLET | SUBLINGUAL | 2 refills | Status: DC | PRN

## 2018-06-06 MED ORDER — ONDANSETRON HCL 4 MG/2ML IJ SOLN: 4.0000 mg | Freq: Four times a day (QID) | INTRAMUSCULAR | Status: DC | PRN

## 2018-06-06 MED ORDER — ASPIRIN 81 MG PO CHEW: 81.0000 mg | CHEWABLE_TABLET | ORAL | Status: DC

## 2018-06-06 MED ORDER — LIDOCAINE HCL (PF) 1 % IJ SOLN
INTRAMUSCULAR | Status: DC | PRN
  Administered 2018-06-06: 5 mL via SUBCUTANEOUS

## 2018-06-06 MED ORDER — HEPARIN (PORCINE) IN NACL 1000-0.9 UT/500ML-% IV SOLN
INTRAVENOUS | Status: AC
  Filled 2018-06-06: qty 1000

## 2018-06-06 MED ORDER — HEPARIN SODIUM (PORCINE) 1000 UNIT/ML IJ SOLN
INTRAMUSCULAR | Status: DC | PRN
  Administered 2018-06-06: 3500 [IU] via INTRAVENOUS
  Administered 2018-06-06 (×2): 3000 [IU] via INTRAVENOUS

## 2018-06-06 MED ORDER — HYDRALAZINE HCL 20 MG/ML IJ SOLN
5.0000 mg | INTRAMUSCULAR | Status: AC | PRN
  Administered 2018-06-06 (×4): 5 mg via INTRAVENOUS
  Filled 2018-06-06 (×2): qty 1

## 2018-06-06 MED ORDER — ACETAMINOPHEN 325 MG PO TABS: 650.0000 mg | ORAL_TABLET | ORAL | Status: DC | PRN

## 2018-06-06 MED ORDER — SODIUM CHLORIDE 0.9 % WEIGHT BASED INFUSION
3.0000 mL/kg/h | INTRAVENOUS | Status: AC
  Administered 2018-06-06: 3 mL/kg/h via INTRAVENOUS

## 2018-06-06 MED FILL — CLOPIDOGREL 75 MG TABLET: 75 | 30 days supply | Qty: 30 | Fill #0

## 2018-06-06 SURGICAL SUPPLY — 25 items
BALLN EMERGE MR 2.5X12 (BALLOONS) ×2
BALLN SAPPHIRE 3.0X12 (BALLOONS) ×2
BALLN SAPPHIRE ~~LOC~~ 3.75X15 (BALLOONS) ×2 IMPLANT
BALLOON EMERGE MR 2.5X12 (BALLOONS) ×1 IMPLANT
BALLOON SAPPHIRE 3.0X12 (BALLOONS) ×1 IMPLANT
CATH BALLN WEDGE 5F 110CM (CATHETERS) ×2 IMPLANT
CATH IMPULSE 5F ANG/FL3.5 (CATHETERS) ×2 IMPLANT
CATH VISTA GUIDE 6FR XBLAD3.5 (CATHETERS) ×2 IMPLANT
DEVICE RAD COMP TR BAND LRG (VASCULAR PRODUCTS) ×2 IMPLANT
GLIDESHEATH SLEND SS 6F .021 (SHEATH) ×2 IMPLANT
GUIDEWIRE INQWIRE 1.5J.035X260 (WIRE) ×1 IMPLANT
GUIDEWIRE PRESSURE COMET II (WIRE) ×2 IMPLANT
INQWIRE 1.5J .035X260CM (WIRE) ×2
KIT ENCORE 26 ADVANTAGE (KITS) ×2 IMPLANT
KIT HEART LEFT (KITS) ×2 IMPLANT
KIT HEMO VALVE WATCHDOG (MISCELLANEOUS) ×2 IMPLANT
PACK CARDIAC CATHETERIZATION (CUSTOM PROCEDURE TRAY) ×2 IMPLANT
SHEATH GLIDE SLENDER 4/5FR (SHEATH) ×2 IMPLANT
STENT SYNERGY DES 3.5X20 (Permanent Stent) ×2 IMPLANT
STENT SYNERGY DES 3X12 (Permanent Stent) ×2 IMPLANT
SYR MEDRAD MARK V 150ML (SYRINGE) ×2 IMPLANT
TRANSDUCER W/STOPCOCK (MISCELLANEOUS) ×2 IMPLANT
TUBING CIL FLEX 10 FLL-RA (TUBING) ×2 IMPLANT
WIRE ASAHI PROWATER 180CM (WIRE) ×2 IMPLANT
WIRE HI TORQ VERSACORE-J 145CM (WIRE) ×2 IMPLANT

## 2018-06-06 NOTE — Progress Notes (Addendum)
Krista,PA notified of B/P and orders noted and per [facility] client may be discharged after taking b/p pill

## 2018-06-06 NOTE — Discharge Instructions (Signed)
Radial Site Care °Refer to this sheet in the next few weeks. These instructions provide you with information about caring for yourself after your procedure. Your health care provider may also give you more specific instructions. Your treatment has been planned according to current medical practices, but problems sometimes occur. Call your health care provider if you have any problems or questions after your procedure. °What can I expect after the procedure? °After your procedure, it is typical to have the following: °· Bruising at the radial site that usually fades within 1-2 weeks. °· Blood collecting in the tissue (hematoma) that may be painful to the touch. It should usually decrease in size and tenderness within 1-2 weeks. ° °Follow these instructions at home: °· Take medicines only as directed by your health care provider. °· You may shower 24-48 hours after the procedure or as directed by your health care provider. Remove the bandage (dressing) and gently wash the site with plain soap and water. Pat the area dry with a clean towel. Do not rub the site, because this may cause bleeding. °· Do not take baths, swim, or use a hot tub until your health care provider approves. °· Check your insertion site every day for redness, swelling, or drainage. °· Do not apply powder or lotion to the site. °· Do not flex or bend the affected arm for 24 hours or as directed by your health care provider. °· Do not push or pull heavy objects with the affected arm for 24 hours or as directed by your health care provider. °· Do not lift over 10 lb (4.5 kg) for 5 days after your procedure or as directed by your health care provider. °· Ask your health care provider when it is okay to: °? Return to work or school. °? Resume usual physical activities or sports. °? Resume sexual activity. °· Do not drive home if you are discharged the same day as the procedure. Have someone else drive you. °· You may drive 24 hours after the procedure  unless otherwise instructed by your health care provider. °· Do not operate machinery or power tools for 24 hours after the procedure. °· If your procedure was done as an outpatient procedure, which means that you went home the same day as your procedure, a responsible adult should be with you for the first 24 hours after you arrive home. °· Keep all follow-up visits as directed by your health care provider. This is important. °Contact a health care provider if: °· You have a fever. °· You have chills. °· You have increased bleeding from the radial site. Hold pressure on the site. °Get help right away if: °· You have unusual pain at the radial site. °· You have redness, warmth, or swelling at the radial site. °· You have drainage (other than a small amount of blood on the dressing) from the radial site. °· The radial site is bleeding, and the bleeding does not stop after 30 minutes of holding steady pressure on the site. °· Your arm or hand becomes pale, cool, tingly, or numb. °This information is not intended to replace advice given to you by your health care provider. Make sure you discuss any questions you have with your health care provider. °Document Released: 10/20/2010 Document Revised: 02/23/2016 Document Reviewed: 04/05/2014 °Elsevier Interactive Patient Education © 2018 Else[employer] Inc[employer] ° °Venogram, Care After °This sheet gives you information about how to care for yourself after your procedure. Your health care provider may also give you   more specific instructions. If you have problems or questions, contact your health care provider. °What can I expect after the procedure? °After the procedure, it is common to have: °· Bruising or mild discomfort in the area where the IV was inserted (insertion site). ° °Follow these instructions at home: °Eating and drinking °· Follow instructions from your health care provider about eating or drinking restrictions. °· Drink a lot of fluids for the first several days after  the procedure, as directed by your health care provider. This helps to wash (flush) the contrast out of your body. Examples of healthy fluids include water or low-calorie drinks. °General instructions °· Check your IV insertion area every day for signs of infection. Check for: °? Redness, swelling, or pain. °? Fluid or blood. °? Warmth. °? Pus or a bad smell. °· Take over-the-counter and prescription medicines only as told by your health care provider. °· Rest and return to your normal activities as told by your health care provider. Ask your health care provider what activities are safe for you. °· Do not drive for 24 hours if you were given a medicine to help you relax (sedative), or until your health care provider approves. °· Keep all follow-up visits as told by your health care provider. This is important. °Contact a health care provider if: °· Your skin becomes itchy or you develop a rash or hives. °· You have a fever that does not get better with medicine. °· You feel nauseous. °· You vomit. °· You have redness, swelling, or pain around the insertion site. °· You have fluid or blood coming from the insertion site. °· Your insertion area feels warm to the touch. °· You have pus or a bad smell coming from the insertion site. °Get help right away if: °· You have difficulty breathing or shortness of breath. °· You develop chest pain. °· You faint. °· You feel very dizzy. °These symptoms may represent a serious problem that is an emergency. Do not wait to see if the symptoms will go away. Get medical help right away. Call your local emergency services (911 in the [place] Do not drive yourself to the hospital. °Summary °· After your procedure, it is common to have bruising or mild discomfort in the area where the IV was inserted. °· You should check your IV insertion area every day for signs of infection. °· Take over-the-counter and prescription medicines only as told by your health care provider. °· You should  drink a lot of fluids for the first several days after the procedure to help flush the contrast from your body. °This information is not intended to replace advice given to you by your health care provider. Make sure you discuss any questions you have with your health care provider. °Document Released: 07/08/2013 Document Revised: 08/11/2016 Document Reviewed: 08/11/2016 °Elsevier Interactive Patient Education © 2017 Elsevier Inc. ° °

## 2018-06-06 NOTE — Progress Notes (Signed)
[id number] Discussed the importance of plavix with stent. Reviewed NTG use, gave heart healthy diet, ex ed and discussed CRP 2. Discussed importance of statin. Referred to [facility] CRP 2. Pt voiced understanding of ed. Neighbor in room with pt. Graylon Good RN BSN 06/06/2018 1:47 PM

## 2018-06-06 NOTE — Progress Notes (Signed)
Floria Raveling, RN in to pull left brachial sheath at 1125 after checking ACT. Pt tolerated well, no bleeding noted.

## 2018-06-06 NOTE — Progress Notes (Signed)
Cardiac rehab in to see pt. 

## 2018-06-06 NOTE — Progress Notes (Signed)
Pharmacist in to give pt bottle of Plavix and reviewed importance of taking medication. Pt verbalized understanding to begin Plavix 75 mg tomorrow and to take everyday.

## 2018-06-06 NOTE — Discharge Summary (Signed)
Discharge Summary/SAME DAY PCI    Patient ID: Tanya [name],  MRN: [id number], DOB/AGE: 1951-06-24 67 y.o.  Admit date: 06/06/2018 Discharge date: 06/06/2018  Primary Care Provider: [name] Primary Cardiologist: Dr. Bronson Ing   Discharge Diagnoses    Principal Problem:   Dyspnea on exertion Active Problems:   Hyperlipemia   Essential hypertension   CAD (coronary artery disease), native coronary artery   Allergies No Known Allergies  Diagnostic Studies/Procedures    Cath: 06/06/18   Prox LAD lesion is 80% stenosed.  Post intervention, there is a 0% residual stenosis.  A drug-eluting stent was successfully placed using a STENT SYNERGY DES 3.5X20.  A drug-eluting stent was successfully placed using a STENT SYNERGY DES 3X12.  The left ventricular systolic function is normal.  LV end diastolic pressure is normal.  The left ventricular ejection fraction is 55-65% by visual estimate.   1. Single vessel obstructive CAD involving the mid LAD immediately after a large diagonal. Abnormal FFR of 0.68. 2. Normal LV function 3. Low LV filling pressures 4. Normal right heart pressures 5. Normal cardiac output 6. Successful PCI of the proximal to mid LAD with DES x 2.   Plan: patient is a candidate for same day DC. Will switch Prilosec to Protonix. Will changed to high dose statin therapy.  Recommend uninterrupted dual antiplatelet therapy with Aspirin 81mg  daily and Clopidogrel 75mg  daily for a minimum of 6 months (stable ischemic heart disease - Class I recommendation). _____________   History of Present Illness     67 yo female with PMH of CAD, HTN, and HL who presented back to the office on 05/30/18 for follow up after having a stress test. Coronary angiography on 11/19/2014 demonstrated a smooth focal 50 to 60% stenosis in the mid LAD just prior to the takeoff of a large diagonal branch. Left circumflex had mild luminal irregularities. The RCA also had  minimal luminal irregularities.  Pressure tracings during cardiac catheterization showed possibly mild aortic stenosis which was not seen on the echocardiogram. There was also trivial to mild aortic insufficiency. LVEDP was elevated at 19 mmHg suggesting diastolic dysfunction.  Nuclear stress test on 03/28/2018 was low risk.  [facility] treadmill score was 11.  She had excellent exercise tolerance and exercised for 11 minutes.  Left ventricular ejection fraction was hyperdynamic.  Blood pressure demonstrated a hypertensive response to exercise.  She continued to experience exertional dyspnea when climbing stairs or pushing the lawnmower and has occasional chest tightness associated with this.  She had been trying to walk on a treadmill and increase the amount of exercise that she does.  She never smoked.  She was not exposed to secondhand smoke.  She denies wheezing.  She seldom coughs.  She denies leg swelling, orthopnea, and paroxysmal nocturnal dyspnea. Given her ongoing symptoms she was set up for outpatient cardiac cath.   Hospital Course     Underwent cath noted above with single vessel CAD in the mLAD with abnormal FFR of 0.68. Successful PCI/DES to the p/mLAD with DES x2. Normal LV function, and Low LV filling pressures. Plan for DAPT with ASA/plavix for at least 6 months. Her statin was switched to Lipitor, and Prilosec switched to Protonix. Her Maxide was stopped given her low filling pressures. Seen by cardiac rehab while in short stay. Radial cath site stable, with mild bruising and soft after small hematoma. Instructions/precautions given prior to discharge. Husband's sister will be staying with patient this evening.  Jaleia A Likins was seen by Dr. [name] and determined stable for discharge home. Follow up in the office has been arranged. Medications are listed below.   _____________  Discharge Vitals Blood pressure (!) 173/83, pulse 84, temperature 98 F (36.7 C), temperature  source Oral, resp. rate 17, height 5\' 4"  (1.626 m), weight 65.3 kg, SpO2 99 %.  Filed Weights   06/06/18 0702  Weight: 65.3 kg    Labs & Radiologic Studies    CBC No results for input(s): WBC, NEUTROABS, HGB, HCT, MCV, PLT in the last 72 hours. Basic Metabolic Panel No results for input(s): NA, K, CL, CO2, GLUCOSE, BUN, CREATININE, CALCIUM, MG, PHOS in the last 72 hours. Liver Function Tests No results for input(s): AST, ALT, ALKPHOS, BILITOT, PROT, ALBUMIN in the last 72 hours. No results for input(s): LIPASE, AMYLASE in the last 72 hours. Cardiac Enzymes No results for input(s): CKTOTAL, CKMB, CKMBINDEX, TROPONINI in the last 72 hours. BNP Invalid input(s): POCBNP D-Dimer No results for input(s): DDIMER in the last 72 hours. Hemoglobin A1C No results for input(s): HGBA1C in the last 72 hours. Fasting Lipid Panel No results for input(s): CHOL, HDL, LDLCALC, TRIG, CHOLHDL, LDLDIRECT in the last 72 hours. Thyroid Function Tests No results for input(s): TSH, T4TOTAL, T3FREE, THYROIDAB in the last 72 hours.  Invalid input(s): FREET3 _____________  No results found. Disposition   Pt is being discharged home today in good condition.  Follow-up Plans & Appointments    Follow-up Information    Herminio Commons, MD Follow up on 06/11/2018.   Specialty:  Cardiology Why:  at 10am for your follow up appt.  Contact information: [place] [id number] [phone]          Discharge Instructions    Amb Referral to Cardiac Rehabilitation   Complete by:  As directed    Diagnosis:  Coronary Stents     Discharge Medications     Medication List    STOP taking these medications   omeprazole 20 MG capsule Commonly known as:  PRILOSEC   pravastatin 40 MG tablet Commonly known as:  PRAVACHOL   triamterene-hydrochlorothiazide 37.5-25 MG tablet Commonly known as:  MAXZIDE-25     TAKE these medications   acetaminophen 500 MG tablet Commonly known as:   TYLENOL Take 500 mg by mouth every 6 (six) hours as needed for headache.   aspirin 81 MG tablet Take 81 mg by mouth at bedtime.   atorvastatin 80 MG tablet Commonly known as:  LIPITOR Take 1 tablet (80 mg total) by mouth daily at 6 PM.   busPIRone 5 MG tablet Commonly known as:  BUSPAR Take 1 tablet (5 mg total) by mouth 2 (two) times daily.   CENTRUM tablet Take 1 tablet by mouth daily.   cloNIDine 0.1 MG tablet Commonly known as:  CATAPRES One tablet at bedtime What changed:    how much to take  how to take this  when to take this   clopidogrel 75 MG tablet Commonly known as:  PLAVIX Take 1 tablet (75 mg total) by mouth daily.   fish oil-omega-3 fatty acids 1000 MG capsule Take 1 g by mouth daily.   Garlic 0539 MG Caps Take 1 capsule by mouth daily.   GLUCOSAMINE CHOND COMPLEX/MSM PO Take 1 tablet by mouth daily.   nitroGLYCERIN 0.4 MG SL tablet Commonly known as:  NITROSTAT Place 1 tablet (0.4 mg total) under the tongue every 5 (five) minutes as needed.  pantoprazole 40 MG tablet Commonly known as:  PROTONIX Take 1 tablet (40 mg total) by mouth daily.   vitamin B-12 1000 MCG tablet Commonly known as:  CYANOCOBALAMIN Take 1,000 mcg by mouth daily.   vitamin C 1000 MG tablet Take 1,000 mg by mouth daily.   vitamin E 1000 UNIT capsule Take 1 tablet by mouth daily.      Acute coronary syndrome (MI, NSTEMI, STEMI, etc) this admission?: No.     Outstanding Labs/Studies   FLP/LFTs in 6 weeks if tolerating statin change.   Duration of Discharge Encounter   Greater than 30 minutes including physician time.  Signed, Reino Bellis NP-C 06/06/2018, 3:51 PM

## 2018-06-06 NOTE — Interval H&P Note (Signed)
History and Physical Interval Note:  06/06/2018 8:46 AM  Tanya [name]  has presented today for surgery, with the diagnosis of angina  The various methods of treatment have been discussed with the patient and family. After consideration of risks, benefits and other options for treatment, the patient has consented to  Procedure(s): RIGHT/LEFT HEART CATH AND CORONARY ANGIOGRAPHY (N/A) as a surgical intervention .  The patient's history has been reviewed, patient examined, no change in status, stable for surgery.  I have reviewed the patient's chart and labs.  Questions were answered to the patient's satisfaction.    Cath Lab Visit (complete for each Cath Lab visit)  Clinical Evaluation Leading to the Procedure:   ACS: No.  Non-ACS:    Anginal Classification: CCS III  Anti-ischemic medical therapy: No Therapy  Non-Invasive Test Results: Low-risk stress test findings: cardiac mortality <1%/year  Prior CABG: No previous CABG       [name] [facility] 06/06/2018 8:46 AM

## 2018-06-09 ENCOUNTER — Other Ambulatory Visit: Payer: Self-pay | Admitting: *Deleted

## 2018-06-09 ENCOUNTER — Encounter (HOSPITAL_COMMUNITY): Payer: Self-pay | Admitting: Cardiology

## 2018-06-09 MED ORDER — ATORVASTATIN CALCIUM 80 MG PO TABS: 80.0000 mg | ORAL_TABLET | Freq: Every day | ORAL | 3 refills | Status: DC

## 2018-06-10 ENCOUNTER — Other Ambulatory Visit: Payer: Self-pay

## 2018-06-10 ENCOUNTER — Telehealth: Payer: Self-pay

## 2018-06-10 MED ORDER — CLONIDINE HCL 0.1 MG PO TABS: 0.1000 mg | ORAL_TABLET | Freq: Every day | ORAL | 3 refills | Status: DC

## 2018-06-10 NOTE — Telephone Encounter (Signed)
[id number]  Attempted to contact patient to complete [facility] telephone call. No answer. Left message requesting call back. Will try again later. 1st attempt

## 2018-06-10 NOTE — Telephone Encounter (Signed)
[id number]  Attempted to contact patient to complete [facility] telephone call. No answer. Left message requesting call back. 3rd attempt   

## 2018-06-10 NOTE — Telephone Encounter (Signed)
[id number]  Attempted to contact patient to complete TOC telephone call. No answer. Left message requesting call back. Will try again later. 2nd attempt (late entry)  

## 2018-06-11 ENCOUNTER — Encounter: Payer: Self-pay | Admitting: Cardiovascular Disease

## 2018-06-11 ENCOUNTER — Ambulatory Visit: Payer: Managed Care, Other (non HMO) | Admitting: Cardiovascular Disease

## 2018-06-11 VITALS — BP 132/78 | HR 74 | Ht 64.0 in | Wt 138.0 lb

## 2018-06-11 DIAGNOSIS — I10 Essential (primary) hypertension: Secondary | ICD-10-CM | POA: Diagnosis not present

## 2018-06-11 DIAGNOSIS — E785 Hyperlipidemia, unspecified: Secondary | ICD-10-CM

## 2018-06-11 DIAGNOSIS — Z955 Presence of coronary angioplasty implant and graft: Secondary | ICD-10-CM

## 2018-06-11 DIAGNOSIS — I25118 Atherosclerotic heart disease of native coronary artery with other forms of angina pectoris: Secondary | ICD-10-CM

## 2018-06-11 NOTE — Progress Notes (Signed)
SUBJECTIVE: The patient presents for follow-up after undergoing drug-eluting stent placement (2 stents) to the proximal LAD for an 80% stenosis.  Left ventricular systolic function was normal by visual estimate, LVEF 55 to 65%.  There were normal right heart pressures and normal cardiac output.  She is doing very well and is very grateful for her care.  She denies exertional chest pain and dyspnea.  She denies bleeding problems.  She plans to go to the [place] for 3 weeks in December.    Review of Systems: As per "subjective", otherwise negative.  No Known Allergies  Current Outpatient Medications  Medication Sig Dispense Refill  . acetaminophen (TYLENOL) 500 MG tablet Take 500 mg by mouth every 6 (six) hours as needed for headache.    . Ascorbic Acid (VITAMIN C) 1000 MG tablet Take 1,000 mg by mouth daily.     Marland Kitchen aspirin 81 MG tablet Take 81 mg by mouth at bedtime.     Marland Kitchen atorvastatin (LIPITOR) 80 MG tablet Take 1 tablet (80 mg total) by mouth daily at 6 PM. 90 tablet 3  . busPIRone (BUSPAR) 5 MG tablet Take 1 tablet (5 mg total) by mouth 2 (two) times daily. 60 tablet 3  . cloNIDine (CATAPRES) 0.1 MG tablet Take 1 tablet (0.1 mg total) by mouth at bedtime. One tablet at bedtime 90 tablet 3  . clopidogrel (PLAVIX) 75 MG tablet Take 1 tablet (75 mg total) by mouth daily. 30 tablet 0  . fish oil-omega-3 fatty acids 1000 MG capsule Take 1 g by mouth daily.     . Garlic 2683 MG CAPS Take 1 capsule by mouth daily.    . Misc Natural Products (GLUCOSAMINE CHOND COMPLEX/MSM PO) Take 1 tablet by mouth daily.    . Multiple Vitamins-Minerals (CENTRUM) tablet Take 1 tablet by mouth daily.    . nitroGLYCERIN (NITROSTAT) 0.4 MG SL tablet Place 1 tablet (0.4 mg total) under the tongue every 5 (five) minutes as needed. 25 tablet 2  . pantoprazole (PROTONIX) 40 MG tablet Take 1 tablet (40 mg total) by mouth daily. 30 tablet 1  . triamterene-hydrochlorothiazide (DYAZIDE) 37.5-25 MG capsule Take  1 capsule by mouth daily.    . vitamin B-12 (CYANOCOBALAMIN) 1000 MCG tablet Take 1,000 mcg by mouth daily.    . vitamin E 1000 UNIT capsule Take 1 tablet by mouth daily.      No current facility-administered medications for this visit.     Past Medical History:  Diagnosis Date  . Anhedonia   . Cystitis   . Elevated liver enzymes   . Fatty liver   . GERD (gastroesophageal reflux disease)   . Hyperlipidemia   . Hypertension   . Insomnia   . Unspecified hypothyroidism   . Vaginitis     Past Surgical History:  Procedure Laterality Date  . COLONOSCOPY N/A 02/08/2016   Procedure: COLONOSCOPY;  Surgeon: [name], MD;  Location: [facility];  Service: Endoscopy;  Laterality: N/A;  1200 - moved to 11:45 - Ann to notify  . CORONARY STENT INTERVENTION N/A 06/06/2018   Procedure: CORONARY STENT INTERVENTION;  Surgeon: [name], [name], MD;  Location: [facility];  Service: Cardiovascular;  Laterality: N/A;  . INTRAVASCULAR PRESSURE WIRE/FFR STUDY N/A 06/06/2018   Procedure: INTRAVASCULAR PRESSURE WIRE/FFR STUDY;  Surgeon: [name], [name], MD;  Location: [facility];  Service: Cardiovascular;  Laterality: N/A;  . LEFT HEART CATHETERIZATION WITH CORONARY ANGIOGRAM N/A 11/19/2014  Procedure: LEFT HEART CATHETERIZATION WITH CORONARY ANGIOGRAM;  Surgeon: [name], MD;  Location: [facility] LAB;  Service: Cardiovascular;  Laterality: N/A;  . RIGHT/LEFT HEART CATH AND CORONARY ANGIOGRAPHY N/A 06/06/2018   Procedure: RIGHT/LEFT HEART CATH AND CORONARY ANGIOGRAPHY;  Surgeon: [name], [name], MD;  Location: [facility];  Service: Cardiovascular;  Laterality: N/A;    Social History   Socioeconomic History  . Marital status: Married    Spouse name: Not on file  . Number of children: Not on file  . Years of education: Not on file  . Highest education level: Not on file  Occupational History  . Not on file  Social Needs  . Financial resource strain: Not on file  . Food  insecurity:    Worry: Not on file    Inability: Not on file  . Transportation needs:    Medical: Not on file    Non-medical: Not on file  Tobacco Use  . Smoking status: Never Smoker  . Smokeless tobacco: Never Used  Substance and Sexual Activity  . Alcohol use: No    Alcohol/week: 0.0 standard drinks  . Drug use: No  . Sexual activity: Not on file  Lifestyle  . Physical activity:    Days per week: Not on file    Minutes per session: Not on file  . Stress: Not on file  Relationships  . Social connections:    Talks on phone: Not on file    Gets together: Not on file    Attends religious service: Not on file    Active member of club or organization: Not on file    Attends meetings of clubs or organizations: Not on file    Relationship status: Not on file  . Intimate partner violence:    Fear of current or ex partner: Not on file    Emotionally abused: Not on file    Physically abused: Not on file    Forced sexual activity: Not on file  Other Topics Concern  . Not on file  Social History Narrative   Pt originally from the [place] been in [place] for 20 years       Vitals:   06/11/18 0953  BP: 132/78  Pulse: 74  SpO2: 98%  Weight: 138 lb (62.6 kg)  Height: 5\' 4"  (1.626 m)    Wt Readings from Last 3 Encounters:  06/11/18 138 lb (62.6 kg)  06/06/18 144 lb (65.3 kg)  05/30/18 144 lb 3.2 oz (65.4 kg)     PHYSICAL EXAM General: NAD HEENT: Normal. Neck: No JVD, no thyromegaly. Lungs: Clear to auscultation bilaterally with normal respiratory effort. CV: Regular rate and rhythm, normal S1/S2, no S3/S4, no murmur. No pretibial or periankle edema.  No carotid bruit.   Abdomen: Soft, nontender, no distention.  Neurologic: Alert and oriented.  Psych: Normal affect. Skin: Normal. Musculoskeletal: No gross deformities.    ECG: Reviewed above under Subjective   Labs: Lab Results  Component Value Date/Time   K 3.5 06/03/2018 07:14 AM   BUN 10 06/03/2018 07:14 AM     CREATININE 0.82 06/03/2018 07:14 AM   ALT 27 01/23/2018 09:41 AM   TSH 1.04 02/06/2017 09:34 AM   HGB 12.9 06/03/2018 07:14 AM     Lipids: Lab Results  Component Value Date/Time   LDLCALC 95 07/26/2017 09:25 AM   CHOL 180 07/26/2017 09:25 AM   TRIG 94 07/26/2017 09:25 AM   HDL 66 07/26/2017 09:25 AM  ASSESSMENT AND PLAN: 1.  Coronary artery disease: Recent placement of 2 drug-eluting stents to the proximal LAD.  Symptomatically stable.  Continue dual antiplatelet therapy with aspirin and Plavix for a minimum of 6 months.  Continue atorvastatin 80 mg.  I will make a referral to cardiac rehabilitation.  2.  Hypertension: Blood pressure is controlled on present therapy.  No changes.  3.  Hyperlipidemia: LDL 95 on 07/26/2017.  Continue atorvastatin 80 mg.  I will repeat lipids within the next few months.   Disposition: Follow up 6 months   [name], M.D., F.A.C.C.

## 2018-06-11 NOTE — Patient Instructions (Addendum)
Your physician wants you to follow-up in: 6 months  with Dr.Koneswaran You will receive a reminder letter in the mail two months in advance. If you don't receive a letter, please call our office to schedule the follow-up appointment.     Get FASTING Lipids in 3 months    Your physician recommends that you continue on your current medications as directed. Please refer to the Current Medication list given to you today.    If you need a refill on your cardiac medications before your next appointment, please call your pharmacy.     You have been referred to cardiac rehab, they will call you to schedule apt       Thank you for choosing [facility] !

## 2018-06-13 ENCOUNTER — Other Ambulatory Visit: Payer: Self-pay

## 2018-06-13 MED ORDER — CLONIDINE HCL 0.1 MG PO TABS: 0.1000 mg | ORAL_TABLET | Freq: Every day | ORAL | 3 refills | Status: DC

## 2018-06-18 ENCOUNTER — Ambulatory Visit: Payer: Managed Care, Other (non HMO) | Admitting: Family Medicine

## 2018-06-26 ENCOUNTER — Other Ambulatory Visit: Payer: Self-pay

## 2018-06-26 MED ORDER — BUSPIRONE HCL 5 MG PO TABS: 5.0000 mg | ORAL_TABLET | Freq: Two times a day (BID) | ORAL | 0 refills | Status: DC

## 2018-06-29 ENCOUNTER — Other Ambulatory Visit: Payer: Self-pay | Admitting: Family Medicine

## 2018-07-04 ENCOUNTER — Ambulatory Visit: Payer: Managed Care, Other (non HMO) | Admitting: Physician Assistant

## 2018-08-04 ENCOUNTER — Other Ambulatory Visit: Payer: Self-pay | Admitting: Cardiology

## 2018-08-12 ENCOUNTER — Ambulatory Visit (INDEPENDENT_AMBULATORY_CARE_PROVIDER_SITE_OTHER): Payer: Managed Care, Other (non HMO) | Admitting: Family Medicine

## 2018-08-12 ENCOUNTER — Encounter: Payer: Self-pay | Admitting: Family Medicine

## 2018-08-12 VITALS — BP 120/68 | HR 71 | Resp 16 | Ht 64.0 in | Wt 145.0 lb

## 2018-08-12 DIAGNOSIS — M810 Age-related osteoporosis without current pathological fracture: Secondary | ICD-10-CM | POA: Diagnosis not present

## 2018-08-12 DIAGNOSIS — G8929 Other chronic pain: Secondary | ICD-10-CM

## 2018-08-12 DIAGNOSIS — G4489 Other headache syndrome: Secondary | ICD-10-CM | POA: Diagnosis not present

## 2018-08-12 DIAGNOSIS — Z1239 Encounter for other screening for malignant neoplasm of breast: Secondary | ICD-10-CM | POA: Diagnosis not present

## 2018-08-12 DIAGNOSIS — Z0000 Encounter for general adult medical examination without abnormal findings: Secondary | ICD-10-CM | POA: Diagnosis not present

## 2018-08-12 DIAGNOSIS — R51 Headache: Secondary | ICD-10-CM

## 2018-08-12 DIAGNOSIS — M8580 Other specified disorders of bone density and structure, unspecified site: Secondary | ICD-10-CM

## 2018-08-12 DIAGNOSIS — G44219 Episodic tension-type headache, not intractable: Secondary | ICD-10-CM

## 2018-08-12 DIAGNOSIS — G4459 Other complicated headache syndrome: Secondary | ICD-10-CM

## 2018-08-12 MED ORDER — ONDANSETRON HCL 4 MG PO TABS: 4.0000 mg | ORAL_TABLET | Freq: Three times a day (TID) | ORAL | 0 refills | Status: DC | PRN

## 2018-08-12 NOTE — Assessment & Plan Note (Addendum)
Daily headache with nausea for months, no response to medication, no trigger identified, no associated loss of power or sensation neuro exam is normal, refer for head CT scan due to c/o daily unresloved headache

## 2018-08-12 NOTE — Patient Instructions (Signed)
F/u in 4.5 month, call if you need me sooner  Mammogram and bone density to be scheduled  Head scan will be ordered due to headaches  Zofran is prescribed for nausea

## 2018-08-12 NOTE — Progress Notes (Signed)
   Tanya [name]     MRN: [id number]      DOB: 16-Oct-1950     Tanya [name]     MRN: [id number]      DOB: June 13, 1951  HPI: Patient is in for annual physical exam. C/o daily headache for over 6 months, no relief with medication, denies any change in vision or loss of power or sensation associated with the headache Immunization is reviewed , and  updated if needed.   PE: BP 120/68   Pulse 71   Resp 16   Ht 5\' 4"  (1.626 m)   Wt 145 lb (65.8 kg)   SpO2 98%   BMI 24.89 kg/m   Pleasant  female, alert and oriented x 3, in no cardio-pulmonary distress. Afebrile. HEENT No facial trauma or asymetry. Sinuses non tender.  Extra occullar muscles intact, pupils equally reactive to light.Fundoscopy: no vaacular abnormality or hemorrhage, disc margins appear clear External ears normal, tympanic membranes clear. Oropharynx moist, no exudate. Neck: supple, no adenopathy,JVD or thyromegaly.No bruits.  Chest: Clear to ascultation bilaterally.No crackles or wheezes. Non tender to palpation  Breast: No asymetry,no masses or lumps. No tenderness. No nipple discharge or inversion. No axillary or supraclavicular adenopathy  Cardiovascular system; Heart sounds normal,  S1 and  S2 ,no S3.  No murmur, or thrill. Apical beat not displaced Peripheral pulses normal.  Abdomen: Soft, non tender, no organomegaly or masses. No bruits. Bowel sounds normal. No guarding, tenderness or rebound.   Musculoskeletal exam: Full ROM of spine, hips , shoulders and knees. No deformity ,swelling or crepitus noted. No muscle wasting or atrophy.   Neurologic: Cranial nerves 2 to 12 intact. Power, tone ,sensation and reflexes normal throughout. No disturbance in gait. No tremor.  Skin: Intact, no ulceration, erythema , scaling or rash noted. Pigmentation normal throughout  Psych; Normal mood and affect. Judgement and concentration normal   Assessment & Plan:  Headache Daily headache with nausea  for months, no response to medication, no trigger identified, no associated loss of power or sensation neuro exam is normal, refer for head CT scan due to c/o daily unresloved headache  Annual physical exam Annual exam as documented. Counseling done  re healthy lifestyle involving commitment to 150 minutes exercise per week, heart healthy diet, and attaining healthy weight.The importance of adequate sleep also discussed.  Changes in health habits are decided on by the patient with goals and time frames  set for achieving them. Immunization and cancer screening needs are specifically addressed at this visit.

## 2018-09-01 ENCOUNTER — Encounter: Payer: Self-pay | Admitting: Family Medicine

## 2018-09-01 NOTE — Assessment & Plan Note (Signed)
Annual exam as documented. Counseling done  re healthy lifestyle involving commitment to 150 minutes exercise per week, heart healthy diet, and attaining healthy weight.The importance of adequate sleep also discussed. Changes in health habits are decided on by the patient with goals and time frames  set for achieving them. Immunization and cancer screening needs are specifically addressed at this visit. 

## 2018-09-07 ENCOUNTER — Other Ambulatory Visit: Payer: Self-pay | Admitting: Family Medicine

## 2018-09-25 ENCOUNTER — Ambulatory Visit (HOSPITAL_COMMUNITY): Payer: Managed Care, Other (non HMO)

## 2018-12-01 ENCOUNTER — Other Ambulatory Visit (HOSPITAL_COMMUNITY): Payer: Managed Care, Other (non HMO)

## 2018-12-01 ENCOUNTER — Ambulatory Visit (HOSPITAL_COMMUNITY)
Admission: RE | Admit: 2018-12-01 | Discharge: 2018-12-01 | Disposition: A | Discharge status: Home / Self Care | Payer: Managed Care, Other (non HMO) | Source: Ambulatory Visit | Attending: Family Medicine | Admitting: Family Medicine

## 2018-12-01 DIAGNOSIS — Z1239 Encounter for other screening for malignant neoplasm of breast: Secondary | ICD-10-CM

## 2018-12-01 DIAGNOSIS — Z1231 Encounter for screening mammogram for malignant neoplasm of breast: Secondary | ICD-10-CM | POA: Diagnosis not present

## 2018-12-14 ENCOUNTER — Other Ambulatory Visit: Payer: Self-pay | Admitting: Cardiovascular Disease

## 2018-12-22 ENCOUNTER — Telehealth: Payer: Self-pay | Admitting: Cardiovascular Disease

## 2018-12-22 DIAGNOSIS — I10 Essential (primary) hypertension: Secondary | ICD-10-CM

## 2018-12-22 DIAGNOSIS — R0602 Shortness of breath: Secondary | ICD-10-CM

## 2018-12-22 NOTE — Telephone Encounter (Signed)
Patient walk in   Asking to be seen by doctor for SOB. Explained that I would have someone from triage call her back and advise where to schedule  Breaks at 9am and 12pm gets off work at 3 pm

## 2018-12-24 NOTE — Telephone Encounter (Signed)
Pt c/o SOB for last 3 months gradulally getting worse. Denies any chest pain - has some weight gain on 5lbs in the last month - denies any noticeable swelling - doesn't know what BP has been running - requesting to be seen - is due in March and did explain with pandemic this may not be an option

## 2018-12-25 NOTE — Telephone Encounter (Signed)
Does she have BP cuff? Would start by finding out what vitals are. Has she had recent labs (last ones in Epic from Sept 2019)? If not, could start with BMET, CBC, and BNP.

## 2018-12-26 NOTE — Telephone Encounter (Signed)
Patient will call back with vitals.

## 2018-12-26 NOTE — Telephone Encounter (Signed)
Patient called office back with vitals.  BP 110/56  81.    Stated that she did recently have screening at work & BP there was 134/70.  HDL 58, Triglycerides 104, Glucose 100, & BMI 27.2.    Informed patient that the labs that her provider wants to order was not included in her work screening.  She will go to [place] across from [facility].

## 2018-12-29 ENCOUNTER — Ambulatory Visit: Payer: Managed Care, Other (non HMO) | Admitting: Family Medicine

## 2019-01-02 LAB — BASIC METABOLIC PANEL
BUN: 17 mg/dL (ref 7–25)
CALCIUM: 10.3 mg/dL (ref 8.6–10.4)
CO2: 31 mmol/L (ref 20–32)
CREATININE: 0.91 mg/dL (ref 0.50–0.99)
Chloride: 101 mmol/L (ref 98–110)
Glucose, Bld: 115 mg/dL — ABNORMAL HIGH (ref 65–99)
Potassium: 4 mmol/L (ref 3.5–5.3)
Sodium: 141 mmol/L (ref 135–146)

## 2019-01-02 LAB — BRAIN NATRIURETIC PEPTIDE: Brain Natriuretic Peptide: 32 pg/mL (ref <100)

## 2019-01-02 LAB — CBC
HEMATOCRIT: 37 % (ref 35.0–45.0)
Hemoglobin: 12.6 g/dL (ref 11.7–15.5)
MCH: 31.4 pg (ref 27.0–33.0)
MCHC: 34.1 g/dL (ref 32.0–36.0)
MCV: 92.3 fL (ref 80.0–100.0)
MPV: 8.9 fL (ref 7.5–12.5)
PLATELETS: 394 10*3/uL (ref 140–400)
RBC: 4.01 10*6/uL (ref 3.80–5.10)
RDW: 12.6 % (ref 11.0–15.0)
WBC: 5.3 10*3/uL (ref 3.8–10.8)

## 2019-01-19 ENCOUNTER — Other Ambulatory Visit (HOSPITAL_COMMUNITY): Payer: Managed Care, Other (non HMO)

## 2019-01-26 ENCOUNTER — Ambulatory Visit (HOSPITAL_COMMUNITY)
Admission: RE | Admit: 2019-01-26 | Payer: Managed Care, Other (non HMO) | Source: Home / Self Care | Admitting: Ophthalmology

## 2019-01-26 ENCOUNTER — Encounter (HOSPITAL_COMMUNITY): Admission: RE | Payer: Self-pay | Source: Home / Self Care

## 2019-01-26 SURGERY — PHACOEMULSIFICATION, CATARACT, WITH IOL INSERTION
Anesthesia: Monitor Anesthesia Care | Laterality: Right

## 2019-02-03 ENCOUNTER — Other Ambulatory Visit (HOSPITAL_COMMUNITY): Payer: Managed Care, Other (non HMO)

## 2019-02-09 ENCOUNTER — Encounter (HOSPITAL_COMMUNITY): Admission: RE | Payer: Self-pay | Source: Home / Self Care

## 2019-02-09 ENCOUNTER — Ambulatory Visit (HOSPITAL_COMMUNITY)
Admission: RE | Admit: 2019-02-09 | Payer: Managed Care, Other (non HMO) | Source: Home / Self Care | Admitting: Ophthalmology

## 2019-02-09 SURGERY — PHACOEMULSIFICATION, CATARACT, WITH IOL INSERTION
Anesthesia: Monitor Anesthesia Care | Laterality: Left

## 2019-02-10 ENCOUNTER — Ambulatory Visit (INDEPENDENT_AMBULATORY_CARE_PROVIDER_SITE_OTHER): Payer: Managed Care, Other (non HMO) | Admitting: Family Medicine

## 2019-02-10 ENCOUNTER — Encounter: Payer: Self-pay | Admitting: Family Medicine

## 2019-02-10 VITALS — BP 120/68 | Ht 64.0 in | Wt 145.0 lb

## 2019-02-10 DIAGNOSIS — I10 Essential (primary) hypertension: Secondary | ICD-10-CM | POA: Diagnosis not present

## 2019-02-10 DIAGNOSIS — F419 Anxiety disorder, unspecified: Secondary | ICD-10-CM

## 2019-02-10 DIAGNOSIS — M5430 Sciatica, unspecified side: Secondary | ICD-10-CM | POA: Diagnosis not present

## 2019-02-10 DIAGNOSIS — E785 Hyperlipidemia, unspecified: Secondary | ICD-10-CM

## 2019-02-10 DIAGNOSIS — Z7189 Other specified counseling: Secondary | ICD-10-CM

## 2019-02-10 DIAGNOSIS — R7302 Impaired glucose tolerance (oral): Secondary | ICD-10-CM

## 2019-02-10 DIAGNOSIS — D126 Benign neoplasm of colon, unspecified: Secondary | ICD-10-CM

## 2019-02-10 DIAGNOSIS — I2510 Atherosclerotic heart disease of native coronary artery without angina pectoris: Secondary | ICD-10-CM

## 2019-02-10 DIAGNOSIS — E559 Vitamin D deficiency, unspecified: Secondary | ICD-10-CM

## 2019-02-10 MED ORDER — METHOCARBAMOL 500 MG PO TABS: ORAL_TABLET | ORAL | 0 refills | Status: DC

## 2019-02-10 MED ORDER — PREDNISONE 5 MG PO TABS: 5.0000 mg | ORAL_TABLET | Freq: Two times a day (BID) | ORAL | 0 refills | Status: AC

## 2019-02-10 NOTE — Progress Notes (Signed)
Virtual Visit via Telephone Note  I connected with Tanya [name] on 02/10/19 at 10:40 AM EDT by telephone and verified that I am speaking with the correct person using two identifiers.  Location Provider: office Patient : home   I discussed the limitations, risks, security and privacy concerns of performing an evaluation and management service by telephone and the availability of in person appointments. I also discussed with the patient that there may be a patient responsible charge related to this service. The patient expressed understanding and agreed to proceed. This visit type is conducted due to national recommendations for restrictions regarding the COVID -19 Pandemic. Due to the patient's age and / or co morbidities, this format is felt to be most appropriate at this time without adequate follow up. The patient has no access to video technology/ had technical difficulties with video, requiring transitioning to audio format  only ( telephone ). All issues noted this document were discussed and addressed,no physical exam can be performed in this format.    History of Present Illness: 3 week h/o acute left sciatica, no specific inciting factor, denies any new weakness, numbness or incontinence Denies recent fever or chills. Denies sinus pressure, nasal congestion, ear pain or sore throat. Denies chest congestion, productive cough or wheezing. Denies chest pains, palpitations and leg swelling Denies abdominal pain, nausea, vomiting,diarrhea or constipation.   Denies dysuria, frequency, hesitancy or incontinence. . Denies headaches, seizures, numbness, or tingling. Denies depression, uncontrolled anxiety or insomnia. Denies skin break down or rash.      Observations/Objective: BP 120/68   Ht 5\' 4"  (1.626 m)   Wt 145 lb (65.8 kg)   BMI 24.89 kg/m  Good communication with no confusion and intact memory. Alert and oriented x 3 No signs of respiratory distress during  sppech    Assessment and Plan: Acute sciatica Short course of prednisone and robaxin prescribed, call if no relief next week  Anxiety Controlled, no change in medication   Essential hypertension Controlled, no change in medication DASH diet and commitment to daily physical activity for a minimum of 30 minutes discussed and encouraged, as a part of hypertension management. The importance of attaining a healthy weight is also discussed.  BP/Weight 02/10/2019 08/12/2018 06/11/2018 06/06/2018 05/30/2018 6/64/4034 04/03/2594  Systolic BP 638 756 433 295 188 416 606  Diastolic BP 68 68 78 92 74 70 80  Wt. (Lbs) 145 145 138 144 144.2 141 138  BMI 24.89 24.89 23.69 24.72 24.75 24.2 23.69       Tubular adenoma of colon Rept colonoscopy due in 7 years per GI, 2024  CAD (coronary artery disease), native coronary artery Denies dyspnea or exertional chest pain, has been recently re evaluated by Cardiology and is stable  Coronary artery calcification seen on CAT scan - Moderate LAD lesion noted Covid-19 Education  The signs and symptoms of of COVID -19 were discussed with the patient and how to seek care for testing. ( follow up with PCP or arrange  E-visit) The importance of social  distancing is discussed today.     Follow Up Instructions:    I discussed the assessment and treatment plan with the patient. The patient was provided an opportunity to ask questions and all were answered. The patient agreed with the plan and demonstrated an understanding of the instructions.   The patient was advised to call back or seek an in-person evaluation if the symptoms worsen or if the condition fails to improve as anticipated.  I  provided 25 minutes of non-face-to-face time during this encounter.   Tula Nakayama, MD

## 2019-02-10 NOTE — Patient Instructions (Addendum)
F/u in 6 weeks, call if you need me sooner  You are treated for acute sciatica. Prednisone and robaxin are sent to your pharmacy for short term use ONLY  Please get fasting lipid,  Hepatic,  TSH, vit D and hBA1C 1 week before your next appointment    Sciatica  Sciatica is pain, numbness, weakness, or tingling along your sciatic nerve. The sciatic nerve starts in the lower back and goes down the back of each leg. Sciatica happens when this nerve is pinched or has pressure put on it. Sciatica usually goes away on its own or with treatment. Sometimes, sciatica may keep coming back (recur). Follow these instructions at home: Medicines  Take over-the-counter and prescription medicines only as told by your doctor.  Do not drive or use heavy machinery while taking prescription pain medicine. Managing pain  If directed, put ice on the affected area. ? Put ice in a plastic bag. ? Place a towel between your skin and the bag. ? Leave the ice on for 20 minutes, 2-3 times a day.  After icing, apply heat to the affected area before you exercise or as often as told by your doctor. Use the heat source that your doctor tells you to use, such as a moist heat pack or a heating pad. ? Place a towel between your skin and the heat source. ? Leave the heat on for 20-30 minutes. ? Remove the heat if your skin turns bright red. This is especially important if you are unable to feel pain, heat, or cold. You may have a greater risk of getting burned. Activity  Return to your normal activities as told by your doctor. Ask your doctor what activities are safe for you. ? Avoid activities that make your sciatica worse.  Take short rests during the day. Rest in a lying or standing position. This is usually better than sitting to rest. ? When you rest for a long time, do some physical activity or stretching between periods of rest. ? Avoid sitting for a long time without moving. Get up and move around at least one  time each hour.  Exercise and stretch regularly, as told by your doctor.  Do not lift anything that is heavier than 10 lb (4.5 kg) while you have symptoms of sciatica. ? Avoid lifting heavy things even when you do not have symptoms. ? Avoid lifting heavy things over and over.  When you lift objects, always lift in a way that is safe for your body. To do this, you should: ? Bend your knees. ? Keep the object close to your body. ? Avoid twisting. General instructions  Use good posture. ? Avoid leaning forward when you are sitting. ? Avoid hunching over when you are standing.  Stay at a healthy weight.  Wear comfortable shoes that support your feet. Avoid wearing high heels.  Avoid sleeping on a mattress that is too soft or too hard. You might have less pain if you sleep on a mattress that is firm enough to support your back.  Keep all follow-up visits as told by your doctor. This is important. Contact a doctor if:  You have pain that: ? Wakes you up when you are sleeping. ? Gets worse when you lie down. ? Is worse than the pain you have had in the past. ? Lasts longer than 4 weeks.  You lose weight for without trying. Get help right away if:  You cannot control when you pee (urinate) or poop (have  a bowel movement).  You have weakness in any of these areas and it gets worse. ? Lower back. ? Lower belly (pelvis). ? Butt (buttocks). ? Legs.  You have redness or swelling of your back.  You have a burning feeling when you pee. This information is not intended to replace advice given to you by your health care provider. Make sure you discuss any questions you have with your health care provider. Document Released: 06/26/2008 Document Revised: 02/23/2016 Document Reviewed: 05/27/2015 Elsevier Interactive Patient Education  2019 [employer]. Thanks for choosing [facility], we consider it a privelige to serve you.

## 2019-02-15 ENCOUNTER — Encounter: Payer: Self-pay | Admitting: Family Medicine

## 2019-02-15 DIAGNOSIS — Z7189 Other specified counseling: Secondary | ICD-10-CM | POA: Insufficient documentation

## 2019-02-15 NOTE — Assessment & Plan Note (Signed)
Controlled, no change in medication DASH diet and commitment to daily physical activity for a minimum of 30 minutes discussed and encouraged, as a part of hypertension management. The importance of attaining a healthy weight is also discussed.  BP/Weight 02/10/2019 08/12/2018 06/11/2018 06/06/2018 05/30/2018 5/36/6440 12/03/7423  Systolic BP 956 387 564 332 951 884 166  Diastolic BP 68 68 78 92 74 70 80  Wt. (Lbs) 145 145 138 144 144.2 141 138  BMI 24.89 24.89 23.69 24.72 24.75 24.2 23.69

## 2019-02-15 NOTE — Assessment & Plan Note (Signed)
Denies dyspnea or exertional chest pain, has been recently re evaluated by Cardiology and is stable

## 2019-02-15 NOTE — Assessment & Plan Note (Signed)
Rept colonoscopy due in 7 years per GI, 2024

## 2019-02-15 NOTE — Assessment & Plan Note (Signed)
Covid-19 Education  The signs and symptoms of of COVID -19 were discussed with the patient and how to seek care for testing. ( follow up with PCP or arrange  E-visit) The importance of social  distancing is discussed today.  

## 2019-02-15 NOTE — Assessment & Plan Note (Signed)
Controlled, no change in medication  

## 2019-02-15 NOTE — Assessment & Plan Note (Signed)
Short course of prednisone and robaxin prescribed, call if no relief next week

## 2019-02-25 ENCOUNTER — Other Ambulatory Visit: Payer: Self-pay

## 2019-02-25 LAB — HEPATIC FUNCTION PANEL
AG Ratio: 1.6 (calc) (ref 1.0–2.5)
ALT: 18 U/L (ref 6–29)
AST: 23 U/L (ref 10–35)
Albumin: 4.6 g/dL (ref 3.6–5.1)
Alkaline phosphatase (APISO): 66 U/L (ref 37–153)
Bilirubin, Direct: 0.1 mg/dL (ref 0.0–0.2)
Globulin: 2.9 g/dL (calc) (ref 1.9–3.7)
Indirect Bilirubin: 0.5 mg/dL (calc) (ref 0.2–1.2)
Total Bilirubin: 0.6 mg/dL (ref 0.2–1.2)
Total Protein: 7.5 g/dL (ref 6.1–8.1)

## 2019-02-25 LAB — LIPID PANEL
Cholesterol: 156 mg/dL (ref <200)
HDL: 52 mg/dL (ref >=50)
LDL Cholesterol (Calc): 80 mg/dL (calc)
Non-HDL Cholesterol (Calc): 104 mg/dL (calc) (ref <130)
Total CHOL/HDL Ratio: 3 (calc) (ref <5.0)
Triglycerides: 140 mg/dL (ref <150)

## 2019-02-25 LAB — HEMOGLOBIN A1C
Hgb A1c MFr Bld: 6.2 % of total Hgb — ABNORMAL HIGH (ref <5.7)
Mean Plasma Glucose: 131 (calc)
eAG (mmol/L): 7.3 (calc)

## 2019-02-25 LAB — TSH: TSH: 1.23 mIU/L (ref 0.40–4.50)

## 2019-02-25 LAB — VITAMIN D 25 HYDROXY (VIT D DEFICIENCY, FRACTURES): Vit D, 25-Hydroxy: 38 ng/mL (ref 30–100)

## 2019-02-25 MED ORDER — PANTOPRAZOLE SODIUM 40 MG PO TBEC: 40.0000 mg | DELAYED_RELEASE_TABLET | Freq: Every day | ORAL | 3 refills | Status: DC

## 2019-02-25 MED ORDER — CLOPIDOGREL BISULFATE 75 MG PO TABS: 75.0000 mg | ORAL_TABLET | Freq: Every day | ORAL | 3 refills | Status: DC

## 2019-02-25 NOTE — Telephone Encounter (Signed)
[facility]

## 2019-02-26 ENCOUNTER — Other Ambulatory Visit: Payer: Self-pay

## 2019-02-26 MED ORDER — ROSUVASTATIN CALCIUM 40 MG PO TABS: 40.0000 mg | ORAL_TABLET | Freq: Every day | ORAL | 3 refills | Status: DC

## 2019-03-10 ENCOUNTER — Other Ambulatory Visit: Payer: Self-pay

## 2019-03-10 MED ORDER — ROSUVASTATIN CALCIUM 40 MG PO TABS: 40.0000 mg | ORAL_TABLET | Freq: Every day | ORAL | 0 refills | Status: DC

## 2019-03-25 ENCOUNTER — Encounter: Payer: Self-pay | Admitting: Family Medicine

## 2019-03-25 ENCOUNTER — Other Ambulatory Visit: Payer: Self-pay

## 2019-03-25 ENCOUNTER — Encounter (INDEPENDENT_AMBULATORY_CARE_PROVIDER_SITE_OTHER): Payer: Self-pay

## 2019-03-25 ENCOUNTER — Ambulatory Visit (INDEPENDENT_AMBULATORY_CARE_PROVIDER_SITE_OTHER): Payer: Managed Care, Other (non HMO) | Admitting: Family Medicine

## 2019-03-25 VITALS — BP 150/80 | HR 75 | Temp 97.4°F | Resp 15 | Ht 64.0 in | Wt 139.1 lb

## 2019-03-25 DIAGNOSIS — I10 Essential (primary) hypertension: Secondary | ICD-10-CM | POA: Diagnosis not present

## 2019-03-25 DIAGNOSIS — Z23 Encounter for immunization: Secondary | ICD-10-CM | POA: Insufficient documentation

## 2019-03-25 DIAGNOSIS — E785 Hyperlipidemia, unspecified: Secondary | ICD-10-CM

## 2019-03-25 DIAGNOSIS — R7302 Impaired glucose tolerance (oral): Secondary | ICD-10-CM

## 2019-03-25 DIAGNOSIS — I2510 Atherosclerotic heart disease of native coronary artery without angina pectoris: Secondary | ICD-10-CM

## 2019-03-25 DIAGNOSIS — F5101 Primary insomnia: Secondary | ICD-10-CM

## 2019-03-25 MED ORDER — CLONIDINE HCL 0.2 MG PO TABS: ORAL_TABLET | ORAL | 1 refills | Status: DC

## 2019-03-25 MED ORDER — ROSUVASTATIN CALCIUM 40 MG PO TABS: 40.0000 mg | ORAL_TABLET | Freq: Every day | ORAL | 1 refills | Status: DC

## 2019-03-25 MED ORDER — TRIAMTERENE-HCTZ 37.5-25 MG PO TABS: 1.0000 | ORAL_TABLET | Freq: Every day | ORAL | 1 refills | Status: DC

## 2019-03-25 MED ORDER — TEMAZEPAM 7.5 MG PO CAPS: 7.5000 mg | ORAL_CAPSULE | Freq: Every evening | ORAL | 2 refills | Status: DC | PRN

## 2019-03-25 MED ORDER — CLONIDINE HCL 0.1 MG PO TABS: 0.1000 mg | ORAL_TABLET | Freq: Every day | ORAL | 1 refills | Status: DC

## 2019-03-25 NOTE — Patient Instructions (Addendum)
F/U in 8 weeks,  With MD, shingrix # 2 visit, re evaluate insomnia and blood pressure  Shingrix # 1 today  Blood pressure is high today New for blood pressure clonidine 0.2 mg ONE at bedtime, until you get the new tablet, take TWO clonidine 0.1 mg tablets together please  New for sleep is restoril one at bedtime, this is at your local pharmacy  Information on good sleep habits is provided  You are referred to heart Dr , that office will call with appointment  Reduce butter, oils and cheese, bad cholesterol too high  Reduce sweets and starches like bread and potato, blood sugar above normal  Thanks for choosing [facility], we consider it a privelige to serve you.

## 2019-03-25 NOTE — Progress Notes (Signed)
Tanya [name]     MRN: [id number]      DOB: 1951/09/17   HPI Tanya [name] is here for follow up and re-evaluation of chronic medical conditions, medication management and review of any available recent lab and radiology data.  Preventive health is updated, specifically  Cancer screening and Immunization.   Questions or concerns regarding consultations or procedures which the PT has had in the interim are  addressed. The PT denies any adverse reactions to current medications since the last visit.    ROS Denies recent fever or chills. Denies sinus pressure, nasal congestion, ear pain or sore throat. Denies chest congestion, productive cough or wheezing. Denies chest pains, palpitations and leg swelling Denies abdominal pain, nausea, vomiting,diarrhea or constipation.   Denies dysuria, frequency, hesitancy or incontinence. Denies joint pain, swelling and limitation in mobility. Denies headaches, seizures, numbness, or tingling. Denies depression, anxiety or insomnia. Denies skin break down or rash.   PE  Pulse 75   Temp (!) 97.4 F (36.3 C) (Temporal)   Resp 15   Ht 5\' 4"  (1.626 m)   Wt 139 lb 1.9 oz (63.1 kg)   SpO2 97%   BMI 23.88 kg/m   Patient alert and oriented and in no cardiopulmonary distress.  HEENT: No facial asymmetry, EOMI,   oropharynx pink and moist.  Neck supple no JVD, no mass.  Chest: Clear to auscultation bilaterally.  CVS: S1, S2 no murmurs, no S3.Regular rate.  ABD: Soft non tender.   Ext: No edema  MS: Adequate ROM spine, shoulders, hips and knees.  Skin: Intact, no ulcerations or rash noted.  Psych: Good eye contact, normal affect. Memory intact not anxious or depressed appearing.  CNS: CN 2-12 intact, power,  normal throughout.no focal deficits noted.   Assessment & Plan  Coronary artery calcification seen on CAT scan - Moderate LAD lesion noted Needs f/u wityh card  Need for shingles vaccine After obtaining informed consent, the  vaccine is  administered , with no adverse effect noted at the time of administration.   Essential hypertension Uncontrolled, increase dose of clonidine DASH diet and commitment to daily physical activity for a minimum of 30 minutes discussed and encouraged, as a part of hypertension management. The importance of attaining a healthy weight is also discussed.  BP/Weight 03/25/2019 02/10/2019 08/12/2018 06/11/2018 06/06/2018 05/30/2018 03/16/736  Systolic BP 106 269 485 462 703 500 938  Diastolic BP 80 68 68 78 92 74 70  Wt. (Lbs) 139.12 145 145 138 144 144.2 141  BMI 23.88 24.89 24.89 23.69 24.72 24.75 24.2       Hyperlipemia Hyperlipidemia:Low fat diet discussed and encouraged.   Lipid Panel  Lab Results  Component Value Date   CHOL 156 02/24/2019   HDL 52 02/24/2019   LDLCALC 80 02/24/2019   TRIG 140 02/24/2019   CHOLHDL 3.0 02/24/2019   Needs lower LDL to 70 or less, no med change    Insomnia disorder Sleep hygiene reviewed and written information offered also. Prescription sent for  medication needed.   CAD (coronary artery disease), native coronary artery Follow up with cardiology past due, denies dyspnea or left chest pain, c/o bruising with plavix, I explained that plavix needed due tostent, willr efer for card f/u, stent placed 9  months ago   IGT (impaired glucose tolerance) Patient educated about the importance of limiting  Carbohydrate intake , the need to commit to daily physical activity for a minimum of 30 minutes , and to commit  weight loss. The fact that changes in all these areas will reduce or eliminate all together the development of diabetes is stressed.   Diabetic Labs Latest Ref Rng & Units 02/24/2019 01/01/2019 06/03/2018 01/23/2018 07/26/2017  HbA1c <5.7 % of total Hgb 6.2(H) - - - 5.8(H)  Chol <200 mg/dL 156 - - - 180  HDL > OR = 50 mg/dL 52 - - - 66  Calc LDL mg/dL (calc) 80 - - - 95  Triglycerides <150 mg/dL 140 - - - 94  Creatinine 0.50 - 0.99 mg/dL  - 0.91 0.82 0.67 0.71   BP/Weight 03/25/2019 02/10/2019 08/12/2018 06/11/2018 06/06/2018 05/30/2018 7/61/5183  Systolic BP 437 357 897 847 841 282 081  Diastolic BP 80 68 68 78 92 74 70  Wt. (Lbs) 139.12 145 145 138 144 144.2 141  BMI 23.88 24.89 24.89 23.69 24.72 24.75 24.2   No flowsheet data found.

## 2019-03-25 NOTE — Assessment & Plan Note (Signed)
Needs f/u wityh card

## 2019-03-25 NOTE — Assessment & Plan Note (Signed)
After obtaining informed consent, the vaccine is  administered , with no adverse effect noted at the time of administration.  

## 2019-03-29 ENCOUNTER — Encounter: Payer: Self-pay | Admitting: Family Medicine

## 2019-03-29 NOTE — Assessment & Plan Note (Signed)
Uncontrolled, increase dose of clonidine DASH diet and commitment to daily physical activity for a minimum of 30 minutes discussed and encouraged, as a part of hypertension management. The importance of attaining a healthy weight is also discussed.  BP/Weight 03/25/2019 02/10/2019 08/12/2018 06/11/2018 06/06/2018 05/30/2018 1/49/9692  Systolic BP 493 241 991 444 584 835 075  Diastolic BP 80 68 68 78 92 74 70  Wt. (Lbs) 139.12 145 145 138 144 144.2 141  BMI 23.88 24.89 24.89 23.69 24.72 24.75 24.2

## 2019-03-29 NOTE — Assessment & Plan Note (Signed)
Follow up with cardiology past due, denies dyspnea or left chest pain, c/o bruising with plavix, I explained that plavix needed due tostent, willr efer for card f/u, stent placed 9  months ago

## 2019-03-29 NOTE — Assessment & Plan Note (Signed)
Patient educated about the importance of limiting  Carbohydrate intake , the need to commit to daily physical activity for a minimum of 30 minutes , and to commit weight loss. The fact that changes in all these areas will reduce or eliminate all together the development of diabetes is stressed.   Diabetic Labs Latest Ref Rng & Units 02/24/2019 01/01/2019 06/03/2018 01/23/2018 07/26/2017  HbA1c <5.7 % of total Hgb 6.2(H) - - - 5.8(H)  Chol <200 mg/dL 156 - - - 180  HDL > OR = 50 mg/dL 52 - - - 66  Calc LDL mg/dL (calc) 80 - - - 95  Triglycerides <150 mg/dL 140 - - - 94  Creatinine 0.50 - 0.99 mg/dL - 0.91 0.82 0.67 0.71   BP/Weight 03/25/2019 02/10/2019 08/12/2018 06/11/2018 06/06/2018 05/30/2018 8/93/8101  Systolic BP 751 025 852 778 242 353 614  Diastolic BP 80 68 68 78 92 74 70  Wt. (Lbs) 139.12 145 145 138 144 144.2 141  BMI 23.88 24.89 24.89 23.69 24.72 24.75 24.2   No flowsheet data found.

## 2019-03-29 NOTE — Assessment & Plan Note (Signed)
Sleep hygiene reviewed and written information offered also. Prescription sent for  medication needed.  

## 2019-03-29 NOTE — Assessment & Plan Note (Signed)
Hyperlipidemia:Low fat diet discussed and encouraged.   Lipid Panel  Lab Results  Component Value Date   CHOL 156 02/24/2019   HDL 52 02/24/2019   LDLCALC 80 02/24/2019   TRIG 140 02/24/2019   CHOLHDL 3.0 02/24/2019   Needs lower LDL to 70 or less, no med change

## 2019-04-21 ENCOUNTER — Telehealth: Payer: Self-pay | Admitting: Cardiology

## 2019-04-21 NOTE — Telephone Encounter (Signed)

## 2019-04-21 NOTE — Progress Notes (Signed)
Cardiology Office Note:    Date:  04/22/2019   ID:  Tanya [name], DOB 1951-02-26, MRN [id number]  PCP:  [name], MD  Cardiologist:  [name], MD  Referring MD: [name], MD   Chief Complaint  Patient presents with  . Follow-up    CAD  . Chest Pain  . Shortness of Breath    History of Present Illness:    Tanya [name] is a 68 y.o. female with a past medical history significant for CAD s/p DES x2 pLAD 06/06/2018, hypertension, hyperlipidemia, GERD.  The patient has a history of nonobstructive CAD on cardiac cath in 2016.  She was evaluated again for chest pain in 03/2018 with low risk nuclear stress test.  She continued to have exertional dyspnea when climbing stairs and pushing a lawnmower with occasional chest tightness.  She underwent cardiac catheterization on 06/06/2018 and found to have single-vessel CAD in the mid LAD with abnormal FFR.  She underwent successful DES x2 to the proximal and mid LAD.  She is now treated with aspirin, Plavix, statin.  She was last seen in the office on 06/11/2018 by Dr. Bronson Ing at which time she was doing well. She is here today for 6 month follow up. She is complaining of right chest heaviness with shortness of breath occurring every day with activity. Lasting more than 5 minutes, not more than 10 minutes. She has not taken SL NTG. She likes to stay outside, does yardwork, push mowing, caring for chickens. The chest heaviness occurs when she is outside working.   Her symptoms are similar to prior to her stent placement last year. She had improved for a few months after the stent but her symptoms have resumed and have been getting progressively worse.  She is quite concerned over the shortness of breath, limiting her activity.  Past Medical History:  Diagnosis Date  . Anhedonia   . Cystitis   . Elevated liver enzymes   . Fatty liver   . GERD (gastroesophageal reflux disease)   . Hyperlipidemia   . Hypertension    . Insomnia   . Unspecified hypothyroidism   . Vaginitis     Past Surgical History:  Procedure Laterality Date  . COLONOSCOPY N/A 02/08/2016   Procedure: COLONOSCOPY;  Surgeon: [name], MD;  Location: [facility];  Service: Endoscopy;  Laterality: N/A;  1200 - moved to 11:45 - Ann to notify  . CORONARY STENT INTERVENTION N/A 06/06/2018   Procedure: CORONARY STENT INTERVENTION;  Surgeon: [name], [name], MD;  Location: [place] [facility];  Service: Cardiovascular;  Laterality: N/A;  . INTRAVASCULAR PRESSURE WIRE/FFR STUDY N/A 06/06/2018   Procedure: INTRAVASCULAR PRESSURE WIRE/FFR STUDY;  Surgeon: [name], [name], MD;  Location: [place] [facility];  Service: Cardiovascular;  Laterality: N/A;  . LEFT HEART CATHETERIZATION WITH CORONARY ANGIOGRAM N/A 11/19/2014   Procedure: LEFT HEART CATHETERIZATION WITH CORONARY ANGIOGRAM;  Surgeon: [name], MD;  Location: [facility];  Service: Cardiovascular;  Laterality: N/A;  . RIGHT/LEFT HEART CATH AND CORONARY ANGIOGRAPHY N/A 06/06/2018   Procedure: RIGHT/LEFT HEART CATH AND CORONARY ANGIOGRAPHY;  Surgeon: [name], [name], MD;  Location: [place] [facility];  Service: Cardiovascular;  Laterality: N/A;    Current Medications: Current Meds  Medication Sig  . acetaminophen (TYLENOL) 500 MG tablet Take 500 mg by mouth every 6 (six) hours as needed for headache.  . Ascorbic Acid (VITAMIN C) 1000 MG tablet Take 1,000 mg by mouth daily.   Marland Kitchen  busPIRone (BUSPAR) 5 MG tablet TAKE 1 TABLET TWICE A DAY (NEED FOLLOW UP APPOINTMENT WITH PCP) (Patient taking differently: Take 5 mg by mouth 2 (two) times a day. )  . cloNIDine (CATAPRES) 0.2 MG tablet Take one tablet by mouth at bedtime for blood pressure (Patient taking differently: Take 0.2 mg by mouth daily. )  . clopidogrel (PLAVIX) 75 MG tablet Take 1 tablet (75 mg total) by mouth daily.  . fish oil-omega-3 fatty acids 1000 MG capsule Take 1,000 mg by mouth daily.   . nitroGLYCERIN (NITROSTAT) 0.4 MG SL  tablet Place 1 tablet (0.4 mg total) under the tongue every 5 (five) minutes as needed. (Patient taking differently: Place 0.4 mg under the tongue every 5 (five) minutes x 3 doses as needed for chest pain. )  . pantoprazole (PROTONIX) 40 MG tablet Take 1 tablet (40 mg total) by mouth daily.  . rosuvastatin (CRESTOR) 40 MG tablet Take 1 tablet (40 mg total) by mouth daily.  . temazepam (RESTORIL) 7.5 MG capsule Take 1 capsule (7.5 mg total) by mouth at bedtime as needed for sleep.  Marland Kitchen triamterene-hydrochlorothiazide (MAXZIDE-25) 37.5-25 MG tablet Take 1 tablet by mouth daily.  . vitamin B-12 (CYANOCOBALAMIN) 1000 MCG tablet Take 1,000 mcg by mouth daily.  . vitamin E 1000 UNIT capsule Take 1 tablet by mouth daily.   . [DISCONTINUED] aspirin 81 MG tablet Take 81 mg by mouth at bedtime.   . [DISCONTINUED] Multiple Vitamins-Minerals (CENTRUM) tablet Take 1 tablet by mouth daily.     Allergies:   Patient has no known allergies.   Social History   Socioeconomic History  . Marital status: Married    Spouse name: Not on file  . Number of children: Not on file  . Years of education: Not on file  . Highest education level: Not on file  Occupational History  . Not on file  Social Needs  . Financial resource strain: Not on file  . Food insecurity    Worry: Not on file    Inability: Not on file  . Transportation needs    Medical: Not on file    Non-medical: Not on file  Tobacco Use  . Smoking status: Never Smoker  . Smokeless tobacco: Never Used  Substance and Sexual Activity  . Alcohol use: No    Alcohol/week: 0.0 standard drinks  . Drug use: No  . Sexual activity: Not on file  Lifestyle  . Physical activity    Days per week: Not on file    Minutes per session: Not on file  . Stress: Not on file  Relationships  . Social Herbalist on phone: Not on file    Gets together: Not on file    Attends religious service: Not on file    Active member of club or organization: Not on  file    Attends meetings of clubs or organizations: Not on file    Relationship status: Not on file  Other Topics Concern  . Not on file  Social History Narrative   Pt originally from the [place] been in [place] for 20 years       Family History: The patient's family history includes Dementia in her mother; Heart attack in her father; Heart disease in her father and mother; Hypertension in her father and mother. ROS:   Please see the history of present illness.     All other systems reviewed and are negative.  EKGs/Labs/Other Studies Reviewed:    The following  studies were reviewed today:  Cath: 06/06/18  Prox LAD lesion is 80% stenosed.  Post intervention, there is a 0% residual stenosis.  A drug-eluting stent was successfully placed using a STENT SYNERGY DES 3.5X20.  A drug-eluting stent was successfully placed using a STENT SYNERGY DES 3X12.  The left ventricular systolic function is normal.  LV end diastolic pressure is normal.  The left ventricular ejection fraction is 55-65% by visual estimate.  1. Single vessel obstructive CAD involving the mid LAD immediately after a large diagonal. Abnormal FFR of 0.68. 2. Normal LV function 3. Low LV filling pressures 4. Normal right heart pressures 5. Normal cardiac output 6. Successful PCI of the proximal to mid LAD with DES x 2.   Plan: patient is a candidate for same day DC. Will switch Prilosec to Protonix. Will changed to high dose statin therapy.  Recommend uninterrupted dual antiplatelet therapy with Aspirin 81mg  daily and Clopidogrel 75mg  dailyfor a minimum of 6 months (stable ischemic heart disease - Class I recommendation).  EKG:  EKG is ordered today.  The ekg ordered today demonstrates NSR, 72 bpm, QTC 455. No changes from prior.  Recent Labs: 01/01/2019: Brain Natriuretic Peptide 32; BUN 17; Creat 0.91; Hemoglobin 12.6; Platelets 394; Potassium 4.0; Sodium 141 02/24/2019: ALT 18; TSH 1.23   Recent Lipid Panel     Component Value Date/Time   CHOL 156 02/24/2019 0958   TRIG 140 02/24/2019 0958   HDL 52 02/24/2019 0958   CHOLHDL 3.0 02/24/2019 0958   VLDL 26 03/30/2016 0904   LDLCALC 80 02/24/2019 0958    Physical Exam:    VS:  BP (!) 148/70   Pulse 72   Ht 5\' 4"  (1.626 m)   Wt 138 lb 6.4 oz (62.8 kg)   SpO2 99% Comment: on room air  BMI 23.76 kg/m     Wt Readings from Last 3 Encounters:  04/22/19 138 lb 6.4 oz (62.8 kg)  03/25/19 139 lb 1.9 oz (63.1 kg)  02/10/19 145 lb (65.8 kg)     Physical Exam  Constitutional: She is oriented to person, place, and time. She appears well-developed and well-nourished. No distress.  HENT:  Head: Normocephalic and atraumatic.  Neck: Normal range of motion. Neck supple. No JVD present.  Cardiovascular: Normal rate, regular rhythm, normal heart sounds and intact distal pulses. Exam reveals no gallop and no friction rub.  No murmur heard. Pulmonary/Chest: Effort normal and breath sounds normal. No respiratory distress. She has no wheezes. She has no rales.  Abdominal: Soft. Bowel sounds are normal.  Musculoskeletal: Normal range of motion.        General: No edema.  Neurological: She is alert and oriented to person, place, and time.  Skin: Skin is warm and dry.  Psychiatric: She has a normal mood and affect. Her behavior is normal. Judgment and thought content normal.  Vitals reviewed.   ASSESSMENT:    1. Stable angina pectoris ([place])   2. Coronary artery disease involving native coronary artery of native heart without angina pectoris   3. Essential (primary) hypertension   4. Hyperlipidemia, unspecified hyperlipidemia type   5. Pre-op evaluation    PLAN:    In order of problems listed above:  Chest pain with shortness of breath: Patient reporting symptoms similar to what she had prior to her stents in September of last year.  Her symptoms had improved after the stent but began to recur after several months and have been getting  progressively worse, now occurring  every day.  Right-sided chest heaviness and shortness of breath with exertion, resolves with rest.  EKG is without ischemic changes.  She has been compliant with her medications including aspirin and Plavix.  Patient is quite concerned about her symptoms and she has agreed to cardiac catheterization to evaluate her coronary arteries and prior stents, given her stent burden with overlapping stents. Case discussed with Dr. [name] who agrees with plan.  Patient would like to proceed at the earliest opportunity. The patient understands that risks include but are not limited to stroke (1 in 1000), death (1 in 36), kidney failure [usually temporary] (1 in 500), bleeding (1 in 200), allergic reaction [possibly serious] (1 in 200).    She will have labs and COVID testing prior to her cath.  She had normal renal function by labs in April.  Coronary artery disease: S/p DES x2 in LAD 06/2018.  On dual antiplatelet therapy with aspirin and Plavix, intensity statin  Hypertension: Blood pressure is mildly elevated today. Will follow up after cath for any new med changes.  Normal renal function and potassium by labs in 12/2018.    Hyperlipidemia with LDL goal less than 70:  on high intensity statin with atorvastatin 80 mg daily. Lipid panel in 01/2019 with LDL of 80.  Advised improved diet.   Medication Adjustments/Labs and Tests Ordered: Current medicines are reviewed at length with the patient today.  Concerns regarding medicines are outlined above. Labs and tests ordered and medication changes are outlined in the patient instructions below:  Patient Instructions  Your physician recommends that you schedule a follow-up appointment in: [place]  Your physician recommends that you continue on your current medications as directed. Please refer to the Current Medication list given to you today.  Your physician has requested that you have a cardiac catheterization.  Cardiac catheterization is used to diagnose and/or treat various heart conditions. Doctors may recommend this procedure for a number of different reasons. The most common reason is to evaluate chest pain. Chest pain can be a symptom of coronary artery disease (CAD), and cardiac catheterization can show whether plaque is narrowing or blocking your heart's arteries. This procedure is also used to evaluate the valves, as well as measure the blood flow and oxygen levels in different parts of your heart. For further information please visit [email or web address]. Please follow instruction sheet, as given.     Lifestyle Modifications to Prevent and Treat Heart Disease -Recommend heart healthy/Mediterranean diet, with whole grains, fruits, vegetable, fish, lean meats, nuts, and olive oil.  -Limit salt. -Recommend moderate walking, 3-5 times/week for 30-50 minutes each session. Aim for at least 150 minutes.week. Goal should be pace of 3 miles/hours, or walking 1.5 miles in 30 minutes -Recommend avoidance of tobacco products. Avoid excess alcohol. -Keep blood pressure well controlled, ideally less than 130/80.      Signed, Daune Perch, NP  04/22/2019 10:01 AM    [facility]

## 2019-04-21 NOTE — H&P (View-Only) (Signed)
Cardiology Office Note:    Date:  04/22/2019   ID:  [name], DOB Feb 24, 1951, MRN [id number]  PCP:  [name], MD  Cardiologist:  [name], MD  Referring MD: [name], MD   Chief Complaint  Patient presents with  . Follow-up    CAD  . Chest Pain  . Shortness of Breath    History of Present Illness:    Tanya [name] is a 68 y.o. female with a past medical history significant for CAD s/p DES x2 pLAD 06/06/2018, hypertension, hyperlipidemia, GERD.  The patient has a history of nonobstructive CAD on cardiac cath in 2016.  She was evaluated again for chest pain in 03/2018 with low risk nuclear stress test.  She continued to have exertional dyspnea when climbing stairs and pushing a lawnmower with occasional chest tightness.  She underwent cardiac catheterization on 06/06/2018 and found to have single-vessel CAD in the mid LAD with abnormal FFR.  She underwent successful DES x2 to the proximal and mid LAD.  She is now treated with aspirin, Plavix, statin.  She was last seen in the office on 06/11/2018 by Dr. Bronson Ing at which time she was doing well. She is here today for 6 month follow up. She is complaining of right chest heaviness with shortness of breath occurring every day with activity. Lasting more than 5 minutes, not more than 10 minutes. She has not taken SL NTG. She likes to stay outside, does yardwork, push mowing, caring for chickens. The chest heaviness occurs when she is outside working.   Her symptoms are similar to prior to her stent placement last year. She had improved for a few months after the stent but her symptoms have resumed and have been getting progressively worse.  She is quite concerned over the shortness of breath, limiting her activity.  Past Medical History:  Diagnosis Date  . Anhedonia   . Cystitis   . Elevated liver enzymes   . Fatty liver   . GERD (gastroesophageal reflux disease)   . Hyperlipidemia   . Hypertension    . Insomnia   . Unspecified hypothyroidism   . Vaginitis     Past Surgical History:  Procedure Laterality Date  . COLONOSCOPY N/A 02/08/2016   Procedure: COLONOSCOPY;  Surgeon: [name], MD;  Location: [facility];  Service: Endoscopy;  Laterality: N/A;  1200 - moved to 11:45 - Ann to notify  . CORONARY STENT INTERVENTION N/A 06/06/2018   Procedure: CORONARY STENT INTERVENTION;  Surgeon: [name], [name], MD;  Location: [place] [facility];  Service: Cardiovascular;  Laterality: N/A;  . INTRAVASCULAR PRESSURE WIRE/FFR STUDY N/A 06/06/2018   Procedure: INTRAVASCULAR PRESSURE WIRE/FFR STUDY;  Surgeon: [name], [name], MD;  Location: [place] [facility];  Service: Cardiovascular;  Laterality: N/A;  . LEFT HEART CATHETERIZATION WITH CORONARY ANGIOGRAM N/A 11/19/2014   Procedure: LEFT HEART CATHETERIZATION WITH CORONARY ANGIOGRAM;  Surgeon: [name], MD;  Location: [facility] CATH LAB;  Service: Cardiovascular;  Laterality: N/A;  . RIGHT/LEFT HEART CATH AND CORONARY ANGIOGRAPHY N/A 06/06/2018   Procedure: RIGHT/LEFT HEART CATH AND CORONARY ANGIOGRAPHY;  Surgeon: [name], [name], MD;  Location: [place] [facility];  Service: Cardiovascular;  Laterality: N/A;    Current Medications: Current Meds  Medication Sig  . acetaminophen (TYLENOL) 500 MG tablet Take 500 mg by mouth every 6 (six) hours as needed for headache.  . Ascorbic Acid (VITAMIN C) 1000 MG tablet Take 1,000 mg by mouth daily.   Marland Kitchen  busPIRone (BUSPAR) 5 MG tablet TAKE 1 TABLET TWICE A DAY (NEED FOLLOW UP APPOINTMENT WITH PCP) (Patient taking differently: Take 5 mg by mouth 2 (two) times a day. )  . cloNIDine (CATAPRES) 0.2 MG tablet Take one tablet by mouth at bedtime for blood pressure (Patient taking differently: Take 0.2 mg by mouth daily. )  . clopidogrel (PLAVIX) 75 MG tablet Take 1 tablet (75 mg total) by mouth daily.  . fish oil-omega-3 fatty acids 1000 MG capsule Take 1,000 mg by mouth daily.   . nitroGLYCERIN (NITROSTAT) 0.4 MG SL  tablet Place 1 tablet (0.4 mg total) under the tongue every 5 (five) minutes as needed. (Patient taking differently: Place 0.4 mg under the tongue every 5 (five) minutes x 3 doses as needed for chest pain. )  . pantoprazole (PROTONIX) 40 MG tablet Take 1 tablet (40 mg total) by mouth daily.  . rosuvastatin (CRESTOR) 40 MG tablet Take 1 tablet (40 mg total) by mouth daily.  . temazepam (RESTORIL) 7.5 MG capsule Take 1 capsule (7.5 mg total) by mouth at bedtime as needed for sleep.  Marland Kitchen triamterene-hydrochlorothiazide (MAXZIDE-25) 37.5-25 MG tablet Take 1 tablet by mouth daily.  . vitamin B-12 (CYANOCOBALAMIN) 1000 MCG tablet Take 1,000 mcg by mouth daily.  . vitamin E 1000 UNIT capsule Take 1 tablet by mouth daily.   . [DISCONTINUED] aspirin 81 MG tablet Take 81 mg by mouth at bedtime.   . [DISCONTINUED] Multiple Vitamins-Minerals (CENTRUM) tablet Take 1 tablet by mouth daily.     Allergies:   Patient has no known allergies.   Social History   Socioeconomic History  . Marital status: Married    Spouse name: Not on file  . Number of children: Not on file  . Years of education: Not on file  . Highest education level: Not on file  Occupational History  . Not on file  Social Needs  . Financial resource strain: Not on file  . Food insecurity    Worry: Not on file    Inability: Not on file  . Transportation needs    Medical: Not on file    Non-medical: Not on file  Tobacco Use  . Smoking status: Never Smoker  . Smokeless tobacco: Never Used  Substance and Sexual Activity  . Alcohol use: No    Alcohol/week: 0.0 standard drinks  . Drug use: No  . Sexual activity: Not on file  Lifestyle  . Physical activity    Days per week: Not on file    Minutes per session: Not on file  . Stress: Not on file  Relationships  . Social Herbalist on phone: Not on file    Gets together: Not on file    Attends religious service: Not on file    Active member of club or organization: Not on  file    Attends meetings of clubs or organizations: Not on file    Relationship status: Not on file  Other Topics Concern  . Not on file  Social History Narrative   Pt originally from the [place] been in [place] for 20 years       Family History: The patient's family history includes Dementia in her mother; Heart attack in her father; Heart disease in her father and mother; Hypertension in her father and mother. ROS:   Please see the history of present illness.     All other systems reviewed and are negative.  EKGs/Labs/Other Studies Reviewed:    The following  studies were reviewed today:  Cath: 06/06/18  Prox LAD lesion is 80% stenosed.  Post intervention, there is a 0% residual stenosis.  A drug-eluting stent was successfully placed using a STENT SYNERGY DES 3.5X20.  A drug-eluting stent was successfully placed using a STENT SYNERGY DES 3X12.  The left ventricular systolic function is normal.  LV end diastolic pressure is normal.  The left ventricular ejection fraction is 55-65% by visual estimate.  1. Single vessel obstructive CAD involving the mid LAD immediately after a large diagonal. Abnormal FFR of 0.68. 2. Normal LV function 3. Low LV filling pressures 4. Normal right heart pressures 5. Normal cardiac output 6. Successful PCI of the proximal to mid LAD with DES x 2.   Plan: patient is a candidate for same day DC. Will switch Prilosec to Protonix. Will changed to high dose statin therapy.  Recommend uninterrupted dual antiplatelet therapy with Aspirin 81mg  daily and Clopidogrel 75mg  dailyfor a minimum of 6 months (stable ischemic heart disease - Class I recommendation).  EKG:  EKG is ordered today.  The ekg ordered today demonstrates NSR, 72 bpm, QTC 455. No changes from prior.  Recent Labs: 01/01/2019: Brain Natriuretic Peptide 32; BUN 17; Creat 0.91; Hemoglobin 12.6; Platelets 394; Potassium 4.0; Sodium 141 02/24/2019: ALT 18; TSH 1.23   Recent Lipid Panel     Component Value Date/Time   CHOL 156 02/24/2019 0958   TRIG 140 02/24/2019 0958   HDL 52 02/24/2019 0958   CHOLHDL 3.0 02/24/2019 0958   VLDL 26 03/30/2016 0904   LDLCALC 80 02/24/2019 0958    Physical Exam:    VS:  BP (!) 148/70   Pulse 72   Ht 5\' 4"  (1.626 m)   Wt 138 lb 6.4 oz (62.8 kg)   SpO2 99% Comment: on room air  BMI 23.76 kg/m     Wt Readings from Last 3 Encounters:  04/22/19 138 lb 6.4 oz (62.8 kg)  03/25/19 139 lb 1.9 oz (63.1 kg)  02/10/19 145 lb (65.8 kg)     Physical Exam  Constitutional: She is oriented to person, place, and time. She appears well-developed and well-nourished. No distress.  HENT:  Head: Normocephalic and atraumatic.  Neck: Normal range of motion. Neck supple. No JVD present.  Cardiovascular: Normal rate, regular rhythm, normal heart sounds and intact distal pulses. Exam reveals no gallop and no friction rub.  No murmur heard. Pulmonary/Chest: Effort normal and breath sounds normal. No respiratory distress. She has no wheezes. She has no rales.  Abdominal: Soft. Bowel sounds are normal.  Musculoskeletal: Normal range of motion.        General: No edema.  Neurological: She is alert and oriented to person, place, and time.  Skin: Skin is warm and dry.  Psychiatric: She has a normal mood and affect. Her behavior is normal. Judgment and thought content normal.  Vitals reviewed.   ASSESSMENT:    1. Stable angina pectoris ([place])   2. Coronary artery disease involving native coronary artery of native heart without angina pectoris   3. Essential (primary) hypertension   4. Hyperlipidemia, unspecified hyperlipidemia type   5. Pre-op evaluation    PLAN:    In order of problems listed above:  Chest pain with shortness of breath: Patient reporting symptoms similar to what she had prior to her stents in September of last year.  Her symptoms had improved after the stent but began to recur after several months and have been getting  progressively worse, now occurring  every day.  Right-sided chest heaviness and shortness of breath with exertion, resolves with rest.  EKG is without ischemic changes.  She has been compliant with her medications including aspirin and Plavix.  Patient is quite concerned about her symptoms and she has agreed to cardiac catheterization to evaluate her coronary arteries and prior stents, given her stent burden with overlapping stents. Case discussed with Dr. [name] who agrees with plan.  Patient would like to proceed at the earliest opportunity. The patient understands that risks include but are not limited to stroke (1 in 1000), death (1 in 73), kidney failure [usually temporary] (1 in 500), bleeding (1 in 200), allergic reaction [possibly serious] (1 in 200).    She will have labs and COVID testing prior to her cath.  She had normal renal function by labs in April.  Coronary artery disease: S/p DES x2 in LAD 06/2018.  On dual antiplatelet therapy with aspirin and Plavix, intensity statin  Hypertension: Blood pressure is mildly elevated today. Will follow up after cath for any new med changes.  Normal renal function and potassium by labs in 12/2018.    Hyperlipidemia with LDL goal less than 70:  on high intensity statin with atorvastatin 80 mg daily. Lipid panel in 01/2019 with LDL of 80.  Advised improved diet.   Medication Adjustments/Labs and Tests Ordered: Current medicines are reviewed at length with the patient today.  Concerns regarding medicines are outlined above. Labs and tests ordered and medication changes are outlined in the patient instructions below:  Patient Instructions  Your physician recommends that you schedule a follow-up appointment in: [place]  Your physician recommends that you continue on your current medications as directed. Please refer to the Current Medication list given to you today.  Your physician has requested that you have a cardiac catheterization.  Cardiac catheterization is used to diagnose and/or treat various heart conditions. Doctors may recommend this procedure for a number of different reasons. The most common reason is to evaluate chest pain. Chest pain can be a symptom of coronary artery disease (CAD), and cardiac catheterization can show whether plaque is narrowing or blocking your heart's arteries. This procedure is also used to evaluate the valves, as well as measure the blood flow and oxygen levels in different parts of your heart. For further information please visit [facility]uge[facility]iest[facility]tn. Please follow instruction sheet, as given.     Lifestyle Modifications to Prevent and Treat Heart Disease -Recommend heart healthy/Mediterranean diet, with whole grains, fruits, vegetable, fish, lean meats, nuts, and olive oil.  -Limit salt. -Recommend moderate walking, 3-5 times/week for 30-50 minutes each session. Aim for at least 150 minutes.week. Goal should be pace of 3 miles/hours, or walking 1.5 miles in 30 minutes -Recommend avoidance of tobacco products. Avoid excess alcohol. -Keep blood pressure well controlled, ideally less than 130/80.      Signed, Daune Perch, NP  04/22/2019 10:01 AM    [facility]

## 2019-04-21 NOTE — Patient Instructions (Addendum)
Your physician recommends that you schedule a follow-up appointment in: [place]  Your physician recommends that you continue on your current medications as directed. Please refer to the Current Medication list given to you today.  Your physician has requested that you have a cardiac catheterization. Cardiac catheterization is used to diagnose and/or treat various heart conditions. Doctors may recommend this procedure for a number of different reasons. The most common reason is to evaluate chest pain. Chest pain can be a symptom of coronary artery disease (CAD), and cardiac catheterization can show whether plaque is narrowing or blocking your heart's arteries. This procedure is also used to evaluate the valves, as well as measure the blood flow and oxygen levels in different parts of your heart. For further information please visit [facility]iest[employer]tn. Please follow instruction sheet, as given.     Lifestyle Modifications to Prevent and Treat Heart Disease -Recommend heart healthy/Mediterranean diet, with whole grains, fruits, vegetable, fish, lean meats, nuts, and olive oil.  -Limit salt. -Recommend moderate walking, 3-5 times/week for 30-50 minutes each session. Aim for at least 150 minutes.week. Goal should be pace of 3 miles/hours, or walking 1.5 miles in 30 minutes -Recommend avoidance of tobacco products. Avoid excess alcohol. -Keep blood pressure well controlled, ideally less than 130/80.

## 2019-04-22 ENCOUNTER — Other Ambulatory Visit: Payer: Self-pay | Admitting: Cardiology

## 2019-04-22 ENCOUNTER — Other Ambulatory Visit: Payer: Self-pay

## 2019-04-22 ENCOUNTER — Telehealth: Payer: Self-pay | Admitting: Cardiology

## 2019-04-22 ENCOUNTER — Encounter: Payer: Self-pay | Admitting: Cardiology

## 2019-04-22 ENCOUNTER — Other Ambulatory Visit (HOSPITAL_COMMUNITY)
Admission: RE | Admit: 2019-04-22 | Discharge: 2019-04-22 | Disposition: A | Discharge status: Home / Self Care | Payer: Managed Care, Other (non HMO) | Source: Ambulatory Visit | Attending: Cardiovascular Disease | Admitting: Cardiovascular Disease

## 2019-04-22 ENCOUNTER — Ambulatory Visit: Payer: Managed Care, Other (non HMO) | Admitting: Cardiology

## 2019-04-22 ENCOUNTER — Other Ambulatory Visit: Payer: Self-pay | Admitting: *Deleted

## 2019-04-22 ENCOUNTER — Encounter: Payer: Self-pay | Admitting: *Deleted

## 2019-04-22 VITALS — BP 148/70 | HR 72 | Ht 64.0 in | Wt 138.4 lb

## 2019-04-22 DIAGNOSIS — I2510 Atherosclerotic heart disease of native coronary artery without angina pectoris: Secondary | ICD-10-CM

## 2019-04-22 DIAGNOSIS — I10 Essential (primary) hypertension: Secondary | ICD-10-CM | POA: Diagnosis not present

## 2019-04-22 DIAGNOSIS — Z1159 Encounter for screening for other viral diseases: Secondary | ICD-10-CM | POA: Insufficient documentation

## 2019-04-22 DIAGNOSIS — Z01818 Encounter for other preprocedural examination: Secondary | ICD-10-CM

## 2019-04-22 DIAGNOSIS — I208 Other forms of angina pectoris: Secondary | ICD-10-CM | POA: Diagnosis not present

## 2019-04-22 DIAGNOSIS — E785 Hyperlipidemia, unspecified: Secondary | ICD-10-CM | POA: Diagnosis not present

## 2019-04-22 LAB — SARS CORONAVIRUS 2 (TAT 6-24 HRS): SARS Coronavirus 2: NEGATIVE

## 2019-04-22 NOTE — Telephone Encounter (Signed)
Pre-cert Verification for the following procedure    [facility] 7/24 DR [name] AT 10:30AM

## 2019-04-23 ENCOUNTER — Telehealth: Payer: Self-pay | Admitting: *Deleted

## 2019-04-23 LAB — BASIC METABOLIC PANEL WITH GFR
BUN: 14 mg/dL (ref 7–25)
CO2: 34 mmol/L — ABNORMAL HIGH (ref 20–32)
Calcium: 10.4 mg/dL (ref 8.6–10.4)
Chloride: 104 mmol/L (ref 98–110)
Creat: 0.73 mg/dL (ref 0.50–0.99)
GFR, Est African American: 97 mL/min/{1.73_m2} (ref >=60)
GFR, Est Non African American: 84 mL/min/{1.73_m2} (ref >=60)
Glucose, Bld: 96 mg/dL (ref 65–139)
Potassium: 3.5 mmol/L (ref 3.5–5.3)
Sodium: 141 mmol/L (ref 135–146)

## 2019-04-23 LAB — CBC
HCT: 38.6 % (ref 35.0–45.0)
Hemoglobin: 13.3 g/dL (ref 11.7–15.5)
MCH: 31.8 pg (ref 27.0–33.0)
MCHC: 34.5 g/dL (ref 32.0–36.0)
MCV: 92.3 fL (ref 80.0–100.0)
MPV: 8.6 fL (ref 7.5–12.5)
Platelets: 358 10*3/uL (ref 140–400)
RBC: 4.18 10*6/uL (ref 3.80–5.10)
RDW: 13.2 % (ref 11.0–15.0)
WBC: 4.8 10*3/uL (ref 3.8–10.8)

## 2019-04-23 NOTE — Telephone Encounter (Signed)
Pt contacted pre-catheterization scheduled at [facility] for: Friday April 24, 2019 10:30 AM Verified arrival time and place: [place] Entrance A at: 8:30 AM  Covid-19 test date: 04/22/19  No solid food after midnight prior to cath, clear liquids until 5 AM day of procedure. Contrast allergy: no  Hold: Triamterene-HCT-AM of procedure  Except hold medications AM meds can be  taken pre-cath with sip of water including: ASA 81 mg Plavix 75 mg  Confirmed patient has responsible person to drive home post procedure and observe 24 hours after arriving home: yes  Due to Covid-19 pandemic, only one support person will be allowed with patient. Must be the same support person for that patient's entire stay. On arrival the support person will be required to wear a mask and will be screened, including having temporal temperature checked (below 100.4 to be cleared). They will be required to wait in the [facility] waiting room for the duration of the procedure. To limit exposure, MDs will continue to call support person after the procedure instead of speaking with them in consult room.   Patients are required to wear a mask when they enter the hospital.       COVID-19 Pre-Screening Questions:  . In the past 7 to 10 days have you had a cough,  shortness of breath, headache, congestion, fever (100 or greater) body aches, chills, sore throat, or sudden loss of taste or sense of smell? no . Have you been around anyone with known Covid 19? no . Have you been around anyone who is awaiting Covid 19 test results in the past 7 to 10 days? no . Have you been around anyone who has been exposed to Covid 19, or has mentioned symptoms of Covid 19 within the past 7 to 10 days? no   I reviewed procedure/mask/visitor instructions, Covid-19 screening questions with patient, she verbalized understanding.

## 2019-04-24 ENCOUNTER — Encounter (HOSPITAL_COMMUNITY)
Admission: RE | Disposition: A | Discharge status: Home / Self Care | Payer: Managed Care, Other (non HMO) | Source: Home / Self Care | Attending: Cardiology

## 2019-04-24 ENCOUNTER — Other Ambulatory Visit: Payer: Self-pay

## 2019-04-24 ENCOUNTER — Ambulatory Visit (HOSPITAL_COMMUNITY)
Admission: RE | Admit: 2019-04-24 | Discharge: 2019-04-24 | Disposition: A | Discharge status: Home / Self Care | Payer: Managed Care, Other (non HMO) | Attending: Cardiology | Admitting: Cardiology

## 2019-04-24 DIAGNOSIS — R0602 Shortness of breath: Secondary | ICD-10-CM | POA: Diagnosis present

## 2019-04-24 DIAGNOSIS — K219 Gastro-esophageal reflux disease without esophagitis: Secondary | ICD-10-CM | POA: Diagnosis not present

## 2019-04-24 DIAGNOSIS — Z8673 Personal history of transient ischemic attack (TIA), and cerebral infarction without residual deficits: Secondary | ICD-10-CM | POA: Insufficient documentation

## 2019-04-24 DIAGNOSIS — I25118 Atherosclerotic heart disease of native coronary artery with other forms of angina pectoris: Secondary | ICD-10-CM | POA: Insufficient documentation

## 2019-04-24 DIAGNOSIS — I10 Essential (primary) hypertension: Secondary | ICD-10-CM | POA: Diagnosis present

## 2019-04-24 DIAGNOSIS — Z79899 Other long term (current) drug therapy: Secondary | ICD-10-CM | POA: Insufficient documentation

## 2019-04-24 DIAGNOSIS — I2510 Atherosclerotic heart disease of native coronary artery without angina pectoris: Secondary | ICD-10-CM | POA: Diagnosis not present

## 2019-04-24 DIAGNOSIS — E785 Hyperlipidemia, unspecified: Secondary | ICD-10-CM | POA: Diagnosis present

## 2019-04-24 DIAGNOSIS — Z7982 Long term (current) use of aspirin: Secondary | ICD-10-CM | POA: Diagnosis not present

## 2019-04-24 DIAGNOSIS — E039 Hypothyroidism, unspecified: Secondary | ICD-10-CM | POA: Insufficient documentation

## 2019-04-24 DIAGNOSIS — Z7902 Long term (current) use of antithrombotics/antiplatelets: Secondary | ICD-10-CM | POA: Diagnosis not present

## 2019-04-24 DIAGNOSIS — Z8249 Family history of ischemic heart disease and other diseases of the circulatory system: Secondary | ICD-10-CM | POA: Insufficient documentation

## 2019-04-24 HISTORY — PX: LEFT HEART CATH AND CORONARY ANGIOGRAPHY: CATH118249

## 2019-04-24 SURGERY — LEFT HEART CATH AND CORONARY ANGIOGRAPHY
Anesthesia: LOCAL

## 2019-04-24 MED ORDER — VERAPAMIL HCL 2.5 MG/ML IV SOLN
INTRAVENOUS | Status: DC | PRN
  Administered 2019-04-24: 12:00:00 10 mL via INTRA_ARTERIAL

## 2019-04-24 MED ORDER — CLOPIDOGREL BISULFATE 75 MG PO TABS: 75.0000 mg | ORAL_TABLET | Freq: Every day | ORAL | Status: DC

## 2019-04-24 MED ORDER — MIDAZOLAM HCL 2 MG/2ML IJ SOLN
INTRAMUSCULAR | Status: AC
  Filled 2019-04-24: qty 2

## 2019-04-24 MED ORDER — SODIUM CHLORIDE 0.9 % IV SOLN: 250.0000 mL | INTRAVENOUS | Status: DC | PRN

## 2019-04-24 MED ORDER — OXYCODONE HCL 5 MG PO TABS: 5.0000 mg | ORAL_TABLET | ORAL | Status: DC | PRN

## 2019-04-24 MED ORDER — HEPARIN SODIUM (PORCINE) 1000 UNIT/ML IJ SOLN
INTRAMUSCULAR | Status: DC | PRN
  Administered 2019-04-24: 3000 [IU] via INTRAVENOUS

## 2019-04-24 MED ORDER — LIDOCAINE HCL (PF) 1 % IJ SOLN
INTRAMUSCULAR | Status: DC | PRN
  Administered 2019-04-24: 2 mL

## 2019-04-24 MED ORDER — HEPARIN (PORCINE) IN NACL 1000-0.9 UT/500ML-% IV SOLN
INTRAVENOUS | Status: AC
  Filled 2019-04-24: qty 1000

## 2019-04-24 MED ORDER — HEPARIN SODIUM (PORCINE) 1000 UNIT/ML IJ SOLN
INTRAMUSCULAR | Status: AC
  Filled 2019-04-24: qty 1

## 2019-04-24 MED ORDER — HYDRALAZINE HCL 20 MG/ML IJ SOLN: 10.0000 mg | INTRAMUSCULAR | Status: DC | PRN

## 2019-04-24 MED ORDER — SODIUM CHLORIDE 0.9% FLUSH: 3.0000 mL | Freq: Two times a day (BID) | INTRAVENOUS | Status: DC

## 2019-04-24 MED ORDER — SODIUM CHLORIDE 0.9% FLUSH: 3.0000 mL | INTRAVENOUS | Status: DC | PRN

## 2019-04-24 MED ORDER — ASPIRIN 81 MG PO CHEW: 81.0000 mg | CHEWABLE_TABLET | ORAL | Status: DC

## 2019-04-24 MED ORDER — ONDANSETRON HCL 4 MG/2ML IJ SOLN: 4.0000 mg | Freq: Four times a day (QID) | INTRAMUSCULAR | Status: DC | PRN

## 2019-04-24 MED ORDER — ASPIRIN 81 MG PO CHEW: 81.0000 mg | CHEWABLE_TABLET | Freq: Every day | ORAL | Status: DC

## 2019-04-24 MED ORDER — ACETAMINOPHEN 325 MG PO TABS: 650.0000 mg | ORAL_TABLET | ORAL | Status: DC | PRN

## 2019-04-24 MED ORDER — FENTANYL CITRATE (PF) 100 MCG/2ML IJ SOLN
INTRAMUSCULAR | Status: DC | PRN
  Administered 2019-04-24 (×3): 25 ug via INTRAVENOUS

## 2019-04-24 MED ORDER — SODIUM CHLORIDE 0.9 % WEIGHT BASED INFUSION: 1.0000 mL/kg/h | INTRAVENOUS | Status: DC

## 2019-04-24 MED ORDER — HEPARIN (PORCINE) IN NACL 1000-0.9 UT/500ML-% IV SOLN
INTRAVENOUS | Status: DC | PRN
  Administered 2019-04-24 (×2): 500 mL

## 2019-04-24 MED ORDER — MIDAZOLAM HCL 2 MG/2ML IJ SOLN
INTRAMUSCULAR | Status: DC | PRN
  Administered 2019-04-24 (×3): 0.5 mg via INTRAVENOUS

## 2019-04-24 MED ORDER — FENTANYL CITRATE (PF) 100 MCG/2ML IJ SOLN
INTRAMUSCULAR | Status: AC
  Filled 2019-04-24: qty 2

## 2019-04-24 MED ORDER — SODIUM CHLORIDE 0.9 % WEIGHT BASED INFUSION: 3.0000 mL/kg/h | INTRAVENOUS | Status: AC

## 2019-04-24 MED ORDER — LABETALOL HCL 5 MG/ML IV SOLN: 10.0000 mg | INTRAVENOUS | Status: DC | PRN

## 2019-04-24 MED ORDER — CLOPIDOGREL BISULFATE 75 MG PO TABS
75.0000 mg | ORAL_TABLET | Freq: Once | ORAL | Status: AC
  Administered 2019-04-24: 75 mg via ORAL
  Filled 2019-04-24: qty 1

## 2019-04-24 MED ORDER — SODIUM CHLORIDE 0.9 % IV SOLN: INTRAVENOUS | Status: DC

## 2019-04-24 MED ORDER — IOHEXOL 350 MG/ML SOLN
INTRAVENOUS | Status: DC | PRN
  Administered 2019-04-24: 55 mL via INTRA_ARTERIAL

## 2019-04-24 MED ORDER — VERAPAMIL HCL 2.5 MG/ML IV SOLN
INTRAVENOUS | Status: AC
  Filled 2019-04-24: qty 2

## 2019-04-24 MED ORDER — LIDOCAINE HCL (PF) 1 % IJ SOLN
INTRAMUSCULAR | Status: AC
  Filled 2019-04-24: qty 30

## 2019-04-24 SURGICAL SUPPLY — 12 items
BAND ZEPHYR COMPRESS 30 LONG (HEMOSTASIS) ×1 IMPLANT
CATH INFINITI 5 FR JL3.5 (CATHETERS) ×1 IMPLANT
CATH INFINITI JR4 5F (CATHETERS) ×1 IMPLANT
GUIDEWIRE INQWIRE 1.5J.035X260 (WIRE) IMPLANT
INQWIRE 1.5J .035X260CM (WIRE) ×2
KIT HEART LEFT (KITS) ×2 IMPLANT
PACK CARDIAC CATHETERIZATION (CUSTOM PROCEDURE TRAY) ×2 IMPLANT
SHEATH PROBE COVER 6X72 (BAG) ×1 IMPLANT
SHEATH RAIN RADIAL 21G 6FR (SHEATH) ×1 IMPLANT
SYR MEDRAD MARK 7 150ML (SYRINGE) ×2 IMPLANT
TRANSDUCER W/STOPCOCK (MISCELLANEOUS) ×2 IMPLANT
TUBING CIL FLEX 10 FLL-RA (TUBING) ×2 IMPLANT

## 2019-04-24 NOTE — CV Procedure (Signed)
   Real-time vascular ultrasound used for right radial access to perform coronary angiography and left heart cath.  Patent LAD overlapping stent with mid eccentric 30 to 40% narrowing somewhat similar to post implantation.  One view suggests a luminal filling defect as it did immediately post stent.  Normal right coronary, circumflex, and left main.  Close follow-up with Dr. Bronson Ing.

## 2019-04-24 NOTE — Interval H&P Note (Signed)
Cath Lab Visit (complete for each Cath Lab visit)  Clinical Evaluation Leading to the Procedure:   ACS: No.  Non-ACS:    Anginal Classification: CCS II  Anti-ischemic medical therapy: Minimal Therapy (1 class of medications)  Non-Invasive Test Results: No non-invasive testing performed  Prior CABG: No previous CABG      History and Physical Interval Note:  04/24/2019 11:31 AM  Tanya [name]  has presented today for surgery, with the diagnosis of chest pain.  The various methods of treatment have been discussed with the patient and family. After consideration of risks, benefits and other options for treatment, the patient has consented to  Procedure(s): LEFT HEART CATH AND CORONARY ANGIOGRAPHY (N/A) as a surgical intervention.  The patient's history has been reviewed, patient examined, no change in status, stable for surgery.  I have reviewed the patient's chart and labs.  Questions were answered to the patient's satisfaction.     Belva Crome III

## 2019-04-24 NOTE — Discharge Instructions (Signed)
Radial Site Care ° °This sheet gives you information about how to care for yourself after your procedure. Your health care provider may also give you more specific instructions. If you have problems or questions, contact your health care provider. °What can I expect after the procedure? °After the procedure, it is common to have: °· Bruising and tenderness at the catheter insertion area. °Follow these instructions at home: °Medicines °· Take over-the-counter and prescription medicines only as told by your health care provider. °Insertion site care °· Follow instructions from your health care provider about how to take care of your insertion site. Make sure you: °? Wash your hands with soap and water before you change your bandage (dressing). If soap and water are not available, use hand sanitizer. °? Change your dressing as told by your health care provider. °? Leave stitches (sutures), skin glue, or adhesive strips in place. These skin closures may need to stay in place for 2 weeks or longer. If adhesive strip edges start to loosen and curl up, you may trim the loose edges. Do not remove adhesive strips completely unless your health care provider tells you to do that. °· Check your insertion site every day for signs of infection. Check for: °? Redness, swelling, or pain. °? Fluid or blood. °? Pus or a bad smell. °? Warmth. °· Do not take baths, swim, or use a hot tub until your health care provider approves. °· You may shower 24-48 hours after the procedure, or as directed by your health care provider. °? Remove the dressing and gently wash the site with plain soap and water. °? Pat the area dry with a clean towel. °? Do not rub the site. That could cause bleeding. °· Do not apply powder or lotion to the site. °Activity ° °· For 24 hours after the procedure, or as directed by your health care provider: °? Do not flex or bend the affected arm. °? Do not push or pull heavy objects with the affected arm. °? Do not  drive yourself home from the hospital or clinic. You may drive 24 hours after the procedure unless your health care provider tells you not to. °? Do not operate machinery or power tools. °· Do not lift anything that is heavier than 10 lb (4.5 kg), or the limit that you are told, until your health care provider says that it is safe. °· Ask your health care provider when it is okay to: °? Return to work or school. °? Resume usual physical activities or sports. °? Resume sexual activity. °General instructions °· If the catheter site starts to bleed, raise your arm and put firm pressure on the site. If the bleeding does not stop, get help right away. This is a medical emergency. °· If you went home on the same day as your procedure, a responsible adult should be with you for the first 24 hours after you arrive home. °· Keep all follow-up visits as told by your health care provider. This is important. °Contact a health care provider if: °· You have a fever. °· You have redness, swelling, or yellow drainage around your insertion site. °Get help right away if: °· You have unusual pain at the radial site. °· The catheter insertion area swells very fast. °· The insertion area is bleeding, and the bleeding does not stop when you hold steady pressure on the area. °· Your arm or hand becomes pale, cool, tingly, or numb. °These symptoms may represent a serious problem   that is an emergency. Do not wait to see if the symptoms will go away. Get medical help right away. Call your local emergency services (911 in the [place] Do not drive yourself to the hospital. °Summary °· After the procedure, it is common to have bruising and tenderness at the site. °· Follow instructions from your health care provider about how to take care of your radial site wound. Check the wound every day for signs of infection. °· Do not lift anything that is heavier than 10 lb (4.5 kg), or the limit that you are told, until your health care provider says  that it is safe. °This information is not intended to replace advice given to you by your health care provider. Make sure you discuss any questions you have with your health care provider. °Document Released: 10/20/2010 Document Revised: 10/23/2017 Document Reviewed: 10/23/2017 °Elsevier Patient Education © 2020 [employer] ° °

## 2019-04-24 NOTE — Progress Notes (Signed)
TRB deflated at 1445 and removed at 1515.  Occlusive dressing applied to site which is CDI.  Discharge instructions reviewed with pt and husband over the phone.  Pt instructed to take Plavix tomorrow.  Work note given to return to work on W.ednesday

## 2019-04-27 ENCOUNTER — Encounter (HOSPITAL_COMMUNITY): Payer: Self-pay | Admitting: Interventional Cardiology

## 2019-04-29 NOTE — H&P (Signed)
Surgical History & Physical  Patient Name: Tanya [name] DOB: August 19, 1951  Surgery: Cataract extraction with intraocular lens implant phacoemulsification; Right Eye  Surgeon: Baruch Goldmann MD Surgery Date:  05/08/2019 Pre-Op Date:  04/23/2019  HPI: A 73 Yr. old female patient 1. The patient complains of difficulty when viewing TV, reading closed caption, news scrolls on TV, which began 2 years ago. Both eyes are affected. The episode is gradual. The condition's severity increased since last visit. The complaint is associated with glare. Pt states she does not drive at night anymore due to glare and this negatively affecting her quality of life.  Medical History: No hx of macular degeneration or glaucoma  General Health  Heart Problem High Blood Pressure LDL  Review of Systems Negative Allergic/Immunologic Negative Cardiovascular Negative Constitutional Negative Gastrointestinal Negative Genitourinary Negative Ear, Nose, Mouth & Throat Negative Neurological Negative Respiratory  Social   Never smoked   Medication Eye Wash,  Calcium, Omega-3, Vitamin E, Clopidogrel, Atorvastatin, Triamterene, Pantoprazole, Buspirone,   Sx/Procedures Heart Stents,   Drug Allergies   NKDA  History & Physical: Heent:  Cataract, Right eye NECK: supple without bruits LUNGS: lungs clear to auscultation CV: regular rate and rhythm Abdomen: soft and non-tender Impression & Plan: Assessment: 1.  COMBINED FORMS AGE RELATED CATARACT; Both Eyes (H25.813) 2.  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  Cataract accounts for the patient's decreased vision. This visual impairment is not correctable with a tolerable change in glasses or contact lenses. Cataract surgery with an implantation of a new lens should significantly improve the visual and functional status of the patient. Discussed all risks, benefits, alternatives, and potential complications. Discussed the procedures and recovery. Patient  desires to have surgery. A-scan ordered and performed today for intra-ocular lens calculations. The surgery will be performed in order to improve vision for driving, reading, and for eye examinations. Recommend phacoemulsification with intra-ocular lens. Right Eye worse - first. Dilates well - shugarcaine by protocol. Consider Symfony Lens.

## 2019-05-05 ENCOUNTER — Encounter (HOSPITAL_COMMUNITY)
Admission: RE | Admit: 2019-05-05 | Discharge: 2019-05-05 | Disposition: A | Discharge status: Home / Self Care | Payer: Managed Care, Other (non HMO) | Source: Ambulatory Visit | Attending: Ophthalmology | Admitting: Ophthalmology

## 2019-05-05 ENCOUNTER — Encounter (HOSPITAL_COMMUNITY): Payer: Self-pay

## 2019-05-05 ENCOUNTER — Other Ambulatory Visit: Payer: Self-pay

## 2019-05-05 ENCOUNTER — Other Ambulatory Visit (HOSPITAL_COMMUNITY)
Admission: RE | Admit: 2019-05-05 | Discharge: 2019-05-05 | Disposition: A | Discharge status: Home / Self Care | Payer: Managed Care, Other (non HMO) | Source: Ambulatory Visit | Attending: Ophthalmology | Admitting: Ophthalmology

## 2019-05-05 DIAGNOSIS — Z20828 Contact with and (suspected) exposure to other viral communicable diseases: Secondary | ICD-10-CM | POA: Insufficient documentation

## 2019-05-05 DIAGNOSIS — Z01812 Encounter for preprocedural laboratory examination: Secondary | ICD-10-CM | POA: Diagnosis not present

## 2019-05-05 NOTE — Patient Instructions (Signed)
Tanya [name]  05/05/2019     @PREFPERIOPPHARMACY @   Your procedure is scheduled on  05/08/2019.  Report to [facility] at  [phone]  A.M.  Call this number if you have problems the morning of surgery:  [phone]   Remember:  Do not eat or drink after midnight.                       Take these medicines the morning of surgery with A SIP OF WATER  Buspar, clonidine, protonix.    Do not wear jewelry, make-up or nail polish.  Do not wear lotions, powders, or perfumes. Please brush your teeth and wear deodorant.  Do not shave 48 hours prior to surgery.  Men may shave face and neck.  Do not bring valuables to the hospital.  [facility] is not responsible for any belongings or valuables.  Contacts, dentures or bridgework may not be worn into surgery.  Leave your suitcase in the car.  After surgery it may be brought to your room.  For patients admitted to the hospital, discharge time will be determined by your treatment team.  Patients discharged the day of surgery will not be allowed to drive home.   Name and phone number of your driver:   family Special instructions:  None  Please read over the following fact sheets that you were given. Anesthesia Post-op Instructions and Care and Recovery After Surgery       Cataract Surgery, Care After This sheet gives you information about how to care for yourself after your procedure. Your health care provider may also give you more specific instructions. If you have problems or questions, contact your health care provider. What can I expect after the procedure? After the procedure, it is common to have:  Itching.  Discomfort.  Fluid discharge.  Sensitivity to light and to touch.  Bruising in or around the eye.  Mild blurred vision. Follow these instructions at home: Eye care   Do not touch or rub your eyes.  Protect your eyes as told by your health care provider. You may be told to wear a protective eye shield or  sunglasses.  Do not put a contact lens into the affected eye or eyes until your health care provider approves.  Keep the area around your eye clean and dry: ? Avoid swimming. ? Do not allow water to hit you directly in the face while showering. ? Keep soap and shampoo out of your eyes.  Check your eye every day for signs of infection. Watch for: ? Redness, swelling, or pain. ? Fluid, blood, or pus. ? Warmth. ? A bad smell. ? Vision that is getting worse. ? Sensitivity that is getting worse. Activity  Do not drive for 24 hours if you were given a sedative during your procedure.  Avoid strenuous activities, such as playing contact sports, for as long as told by your health care provider.  Do not drive or use heavy machinery until your health care provider approves.  Do not bend or lift heavy objects. Bending increases pressure in the eye. You can walk, climb stairs, and do light household chores.  Ask your health care provider when you can return to work. If you work in a dusty environment, you may be advised to wear protective eyewear for a period of time. General instructions  Take or apply over-the-counter and prescription medicines only as told by your health care provider. This includes  eye drops.  Keep all follow-up visits as told by your health care provider. This is important. Contact a health care provider if:  You have increased bruising around your eye.  You have pain that is not helped with medicine.  You have a fever.  You have redness, swelling, or pain in your eye.  You have fluid, blood, or pus coming from your incision.  Your vision gets worse.  Your sensitivity to light gets worse. Get help right away if:  You have sudden loss of vision.  You see flashes of light or spots (floaters).  You have severe eye pain.  You develop nausea or vomiting. Summary  After your procedure, it is common to have itching, discomfort, bruising, fluid discharge, or  sensitivity to light.  Follow instructions from your health care provider about caring for your eye after the procedure.  Do not rub your eye after the procedure. You may need to wear eye protection or sunglasses. Do not wear contact lenses. Keep the area around your eye clean and dry.  Avoid activities that require a lot of effort. These include playing sports and lifting heavy objects.  Contact a health care provider if you have increased bruising, pain that does not go away, or a fever. Get help right away if you suddenly lose your vision, see flashes of light or spots, or have severe pain in the eye. This information is not intended to replace advice given to you by your health care provider. Make sure you discuss any questions you have with your health care provider. Document Released: 04/06/2005 Document Revised: 03/17/2018 Document Reviewed: 03/17/2018 Elsevier Patient Education  2020 [place] [employer] These instructions provide you with information about caring for yourself after your procedure. Your health care provider may also give you more specific instructions. Your treatment has been planned according to current medical practices, but problems sometimes occur. Call your health care provider if you have any problems or questions after your procedure. What can I expect after the procedure? After your procedure, you may:  Feel sleepy for several hours.  Feel clumsy and have poor balance for several hours.  Feel forgetful about what happened after the procedure.  Have poor judgment for several hours.  Feel nauseous or vomit.  Have a sore throat if you had a breathing tube during the procedure. Follow these instructions at home: For at least 24 hours after the procedure:      Have a responsible adult stay with you. It is important to have someone help care for you until you are awake and alert.  Rest as needed.  Do not: ? Participate  in activities in which you could fall or become injured. ? Drive. ? Use heavy machinery. ? Drink alcohol. ? Take sleeping pills or medicines that cause drowsiness. ? Make important decisions or sign legal documents. ? Take care of children on your own. Eating and drinking  Follow the diet that is recommended by your health care provider.  If you vomit, drink water, juice, or soup when you can drink without vomiting.  Make sure you have little or no nausea before eating solid foods. General instructions  Take over-the-counter and prescription medicines only as told by your health care provider.  If you have sleep apnea, surgery and certain medicines can increase your risk for breathing problems. Follow instructions from your health care provider about wearing your sleep device: ? Anytime you are sleeping, including during daytime naps. ? While  taking prescription pain medicines, sleeping medicines, or medicines that make you drowsy.  If you smoke, do not smoke without supervision.  Keep all follow-up visits as told by your health care provider. This is important. Contact a health care provider if:  You keep feeling nauseous or you keep vomiting.  You feel light-headed.  You develop a rash.  You have a fever. Get help right away if:  You have trouble breathing. Summary  For several hours after your procedure, you may feel sleepy and have poor judgment.  Have a responsible adult stay with you for at least 24 hours or until you are awake and alert. This information is not intended to replace advice given to you by your health care provider. Make sure you discuss any questions you have with your health care provider. Document Released: 01/08/2016 Document Revised: 12/16/2017 Document Reviewed: 01/08/2016 Elsevier Patient Education  2020 [employer].

## 2019-05-06 LAB — SARS CORONAVIRUS 2 (TAT 6-24 HRS): SARS Coronavirus 2: NEGATIVE

## 2019-05-08 ENCOUNTER — Encounter (HOSPITAL_COMMUNITY)
Admission: RE | Disposition: A | Discharge status: Home / Self Care | Payer: Self-pay | Source: Home / Self Care | Attending: Ophthalmology

## 2019-05-08 ENCOUNTER — Encounter (HOSPITAL_COMMUNITY): Payer: Self-pay | Admitting: *Deleted

## 2019-05-08 ENCOUNTER — Ambulatory Visit (HOSPITAL_COMMUNITY): Payer: Managed Care, Other (non HMO) | Admitting: Anesthesiology

## 2019-05-08 ENCOUNTER — Ambulatory Visit (HOSPITAL_COMMUNITY)
Admission: RE | Admit: 2019-05-08 | Discharge: 2019-05-08 | Disposition: A | Discharge status: Home / Self Care | Payer: Managed Care, Other (non HMO) | Attending: Ophthalmology | Admitting: Ophthalmology

## 2019-05-08 ENCOUNTER — Other Ambulatory Visit: Payer: Self-pay

## 2019-05-08 DIAGNOSIS — K219 Gastro-esophageal reflux disease without esophagitis: Secondary | ICD-10-CM | POA: Diagnosis not present

## 2019-05-08 DIAGNOSIS — Z79899 Other long term (current) drug therapy: Secondary | ICD-10-CM | POA: Diagnosis not present

## 2019-05-08 DIAGNOSIS — Z955 Presence of coronary angioplasty implant and graft: Secondary | ICD-10-CM | POA: Diagnosis not present

## 2019-05-08 DIAGNOSIS — H5203 Hypermetropia, bilateral: Secondary | ICD-10-CM | POA: Insufficient documentation

## 2019-05-08 DIAGNOSIS — I2510 Atherosclerotic heart disease of native coronary artery without angina pectoris: Secondary | ICD-10-CM | POA: Insufficient documentation

## 2019-05-08 DIAGNOSIS — H25813 Combined forms of age-related cataract, bilateral: Secondary | ICD-10-CM | POA: Diagnosis not present

## 2019-05-08 DIAGNOSIS — I10 Essential (primary) hypertension: Secondary | ICD-10-CM | POA: Insufficient documentation

## 2019-05-08 HISTORY — PX: CATARACT EXTRACTION W/PHACO: SHX586

## 2019-05-08 SURGERY — PHACOEMULSIFICATION, CATARACT, WITH IOL INSERTION
Anesthesia: Monitor Anesthesia Care | Site: Eye | Laterality: Right

## 2019-05-08 MED ORDER — EPINEPHRINE PF 1 MG/ML IJ SOLN
INTRAOCULAR | Status: DC | PRN
  Administered 2019-05-08: 11:00:00 500 mL

## 2019-05-08 MED ORDER — LIDOCAINE HCL (PF) 1 % IJ SOLN
INTRAOCULAR | Status: DC | PRN
  Administered 2019-05-08: 11:00:00 1 mL via OPHTHALMIC

## 2019-05-08 MED ORDER — BSS IO SOLN
INTRAOCULAR | Status: DC | PRN
  Administered 2019-05-08: 15 mL via INTRAOCULAR

## 2019-05-08 MED ORDER — TETRACAINE HCL 0.5 % OP SOLN
1.0000 [drp] | OPHTHALMIC | Status: AC | PRN
  Administered 2019-05-08 (×3): 1 [drp] via OPHTHALMIC

## 2019-05-08 MED ORDER — PROVISC 10 MG/ML IO SOLN
INTRAOCULAR | Status: DC | PRN
  Administered 2019-05-08: 0.85 mL via INTRAOCULAR

## 2019-05-08 MED ORDER — POVIDONE-IODINE 5 % OP SOLN
OPHTHALMIC | Status: DC | PRN
  Administered 2019-05-08: 1 via OPHTHALMIC

## 2019-05-08 MED ORDER — CYCLOPENTOLATE-PHENYLEPHRINE 0.2-1 % OP SOLN
1.0000 [drp] | OPHTHALMIC | Status: AC | PRN
  Administered 2019-05-08 (×3): 1 [drp] via OPHTHALMIC

## 2019-05-08 MED ORDER — MIDAZOLAM HCL 2 MG/2ML IJ SOLN
INTRAMUSCULAR | Status: AC
  Filled 2019-05-08: qty 2

## 2019-05-08 MED ORDER — NEOMYCIN-POLYMYXIN-DEXAMETH 3.5-10000-0.1 OP SUSP
OPHTHALMIC | Status: DC | PRN
  Administered 2019-05-08: 2 [drp] via OPHTHALMIC

## 2019-05-08 MED ORDER — MIDAZOLAM HCL 2 MG/2ML IJ SOLN
INTRAMUSCULAR | Status: DC | PRN
  Administered 2019-05-08: 2 mg via INTRAVENOUS

## 2019-05-08 MED ORDER — SODIUM HYALURONATE 23 MG/ML IO SOLN
INTRAOCULAR | Status: DC | PRN
  Administered 2019-05-08: 0.6 mL via INTRAOCULAR

## 2019-05-08 MED ORDER — SODIUM CHLORIDE 0.9% FLUSH
10.0000 mL | INTRAVENOUS | Status: DC | PRN
  Administered 2019-05-08: 11:00:00 3 mL via INTRAVENOUS

## 2019-05-08 MED ORDER — LIDOCAINE HCL 3.5 % OP GEL
1.0000 "application " | Freq: Once | OPHTHALMIC | Status: AC
  Administered 2019-05-08: 1 via OPHTHALMIC

## 2019-05-08 MED ORDER — PHENYLEPHRINE HCL 2.5 % OP SOLN
1.0000 [drp] | OPHTHALMIC | Status: AC | PRN
  Administered 2019-05-08 (×3): 1 [drp] via OPHTHALMIC

## 2019-05-08 SURGICAL SUPPLY — 15 items
CLOTH BEACON ORANGE TIMEOUT ST (SAFETY) ×2 IMPLANT
EYE SHIELD UNIVERSAL CLEAR (GAUZE/BANDAGES/DRESSINGS) ×2 IMPLANT
GLOVE BIOGEL PI IND STRL 7.0 (GLOVE) IMPLANT
GLOVE BIOGEL PI INDICATOR 7.0 (GLOVE) ×4
LENS IOL TECNIS SYMFONY 19.5 ×2 IMPLANT
NDL HYPO 18GX1.5 BLUNT FILL (NEEDLE) IMPLANT
NEEDLE HYPO 18GX1.5 BLUNT FILL (NEEDLE) ×3 IMPLANT
PAD ARMBOARD 7.5X6 YLW CONV (MISCELLANEOUS) ×2 IMPLANT
PROC W SPEC LENS (INTRAOCULAR LENS) ×3
PROCESS W SPEC LENS (INTRAOCULAR LENS) IMPLANT
SYR TB 1ML LL NO SAFETY (SYRINGE) ×2 IMPLANT
TAPE SURG TRANSPORE 1 IN (GAUZE/BANDAGES/DRESSINGS) IMPLANT
TAPE SURGICAL TRANSPORE 1 IN (GAUZE/BANDAGES/DRESSINGS) ×2
VISCOELASTIC ADDITIONAL (OPHTHALMIC RELATED) ×2 IMPLANT
WATER STERILE IRR 250ML POUR (IV SOLUTION) ×2 IMPLANT

## 2019-05-08 NOTE — Anesthesia Preprocedure Evaluation (Signed)
Anesthesia Evaluation  Patient identified by MRN, date of birth, ID band Patient awake    Reviewed: Allergy & Precautions, NPO status , Patient's Chart, lab work & pertinent test results  Airway Mallampati: II  TM Distance: >3 FB Neck ROM: Full    Dental  (+) Missing,    Pulmonary    Pulmonary exam normal        Cardiovascular hypertension, Pt. on medications + CAD and + Cardiac Stents  Normal cardiovascular exam Rhythm:Regular     Neuro/Psych  Headaches,  Neuromuscular disease    GI/Hepatic GERD  Medicated and Controlled,  Endo/Other  Hypothyroidism   Renal/GU      Musculoskeletal  (+) Arthritis ,   Abdominal   Peds  Hematology   Anesthesia Other Findings   Reproductive/Obstetrics                             Anesthesia Physical Anesthesia Plan  ASA: III  Anesthesia Plan: MAC   Post-op Pain Management:    Induction:   PONV Risk Score and Plan:   Airway Management Planned: Nasal Cannula and Natural Airway  Additional Equipment:   Intra-op Plan:   Post-operative Plan:   Informed Consent: I have reviewed the patients History and Physical, chart, labs and discussed the procedure including the risks, benefits and alternatives for the proposed anesthesia with the patient or authorized representative who has indicated his/her understanding and acceptance.     Dental advisory given  Plan Discussed with: CRNA  Anesthesia Plan Comments:         Anesthesia Quick Evaluation

## 2019-05-08 NOTE — Op Note (Signed)
Date of procedure: 05/08/19  Pre-operative diagnosis: Visually significant age-related cataract, Right Eye (H25.811)  Post-operative diagnosis: Visually significant age-related cataract, Right Eye  Procedure: Removal of cataract via phacoemulsification and insertion of intra-ocular lens Wynetta Emery and [employer] ZXR00 +19.5D into the capsular bag of the Right Eye  Attending surgeon: Gerda Diss. Kyrstan Gotwalt, MD, MA  Anesthesia: MAC, Topical Akten  Complications: None  Estimated Blood Loss: <4m (minimal)  Specimens: None  Implants: As above  Indications:  Visually significant age-related cataract, Right Eye  Procedure:  The patient was seen and identified in the pre-operative area. The operative eye was identified and dilated.  The operative eye was marked.  Topical anesthesia was administered to the operative eye.     The patient was then to the operative suite and placed in the supine position.  A timeout was performed confirming the patient, procedure to be performed, and all other relevant information.   The patient's face was prepped and draped in the usual fashion for intra-ocular surgery.  A lid speculum was placed into the operative eye and the surgical microscope moved into place and focused.  A superotemporal paracentesis was created using a 20 gauge paracentesis blade.  Shugarcaine was injected into the anterior chamber.  Viscoelastic was injected into the anterior chamber.  A temporal clear-corneal main wound incision was created using a 2.457mmicrokeratome.  A continuous curvilinear capsulorrhexis was initiated using an irrigating cystitome and completed using capsulorrhexis forceps.  Hydrodissection and hydrodeliniation were performed.  Viscoelastic was injected into the anterior chamber.  A phacoemulsification handpiece and a chopper as a second instrument were used to remove the nucleus and epinucleus. The irrigation/aspiration handpiece was used to remove any remaining cortical  material.   The capsular bag was reinflated with viscoelastic, checked, and found to be intact.  The intraocular lens was inserted into the capsular bag and dialed into place using a Kuglen hook.  The irrigation/aspiration handpiece was used to remove any remaining viscoelastic.  The clear corneal wound and paracentesis wounds were then hydrated and checked with Weck-Cels to be watertight.  The lid-speculum and drape was removed, and the patient's face was cleaned with a wet and dry 4x4.  Maxitrol was instilled in the eye before a clear shield was taped over the eye. The patient was taken to the post-operative care unit in good condition, having tolerated the procedure well.  Post-Op Instructions: [facility] and will receive all other orders and instructions.

## 2019-05-08 NOTE — Interval H&P Note (Signed)
History and Physical Interval Note: The H and P was reviewed and updated. The patient was examined.  No changes were found after exam.  The surgical eye was marked.  05/08/2019 10:23 AM  Tanya [name]  has presented today for surgery, with the diagnosis of nuclear cataract right eye.  The various methods of treatment have been discussed with the patient and family. After consideration of risks, benefits and other options for treatment, the patient has consented to  Procedure(s) with comments: CATARACT EXTRACTION PHACO AND INTRAOCULAR LENS PLACEMENT (IOC) (Right) - CDE:  as a surgical intervention.  The patient's history has been reviewed, patient examined, no change in status, stable for surgery.  I have reviewed the patient's chart and labs.  Questions were answered to the patient's satisfaction.     Baruch Goldmann

## 2019-05-08 NOTE — Anesthesia Procedure Notes (Signed)
Procedure Name: MAC Date/Time: 05/08/2019 10:29 AM Performed by: Vista Deck, CRNA Pre-anesthesia Checklist: Patient identified, Emergency Drugs available, Suction available, Timeout performed and Patient being monitored Patient Re-evaluated:Patient Re-evaluated prior to induction Oxygen Delivery Method: Nasal Cannula

## 2019-05-08 NOTE — Discharge Instructions (Addendum)
Monitored Anesthesia Care, Care After °These instructions provide you with information about caring for yourself after your procedure. Your health care provider may also give you more specific instructions. Your treatment has been planned according to current medical practices, but problems sometimes occur. Call your health care provider if you have any problems or questions after your procedure. °What can I expect after the procedure? °After your procedure, you may: °· Feel sleepy for several hours. °· Feel clumsy and have poor balance for several hours. °· Feel forgetful about what happened after the procedure. °· Have poor judgment for several hours. °· Feel nauseous or vomit. °· Have a sore throat if you had a breathing tube during the procedure. °Follow these instructions at home: °For at least 24 hours after the procedure: ° °  ° °· Have a responsible adult stay with you. It is important to have someone help care for you until you are awake and alert. °· Rest as needed. °· Do not: °? Participate in activities in which you could fall or become injured. °? Drive. °? Use heavy machinery. °? Drink alcohol. °? Take sleeping pills or medicines that cause drowsiness. °? Make important decisions or sign legal documents. °? Take care of children on your own. °Eating and drinking °· Follow the diet that is recommended by your health care provider. °· If you vomit, drink water, juice, or soup when you can drink without vomiting. °· Make sure you have little or no nausea before eating solid foods. °General instructions °· Take over-the-counter and prescription medicines only as told by your health care provider. °· If you have sleep apnea, surgery and certain medicines can increase your risk for breathing problems. Follow instructions from your health care provider about wearing your sleep device: °? Anytime you are sleeping, including during daytime naps. °? While taking prescription pain medicines, sleeping medicines,  or medicines that make you drowsy. °· If you smoke, do not smoke without supervision. °· Keep all follow-up visits as told by your health care provider. This is important. °Contact a health care provider if: °· You keep feeling nauseous or you keep vomiting. °· You feel light-headed. °· You develop a rash. °· You have a fever. °Get help right away if: °· You have trouble breathing. °Summary °· For several hours after your procedure, you may feel sleepy and have poor judgment. °· Have a responsible adult stay with you for at least 24 hours or until you are awake and alert. °This information is not intended to replace advice given to you by your health care provider. Make sure you discuss any questions you have with your health care provider. °Document Released: 01/08/2016 Document Revised: 12/16/2017 Document Reviewed: 01/08/2016 °Elsevier Patient Education © 2020 [employer] °Please discharge patient when stable, will follow up today with Dr. Wrzosek at the [facility] office immediately following discharge.  Leave shield in place until visit.  All paperwork with discharge instructions will be given at the office. ° °

## 2019-05-08 NOTE — Anesthesia Postprocedure Evaluation (Signed)
Anesthesia Post Note  Patient: Tanya [name]  Procedure(s) Performed: CATARACT EXTRACTION PHACO AND INTRAOCULAR LENS PLACEMENT (IOC) (Right Eye)  Patient location during evaluation: Short Stay Anesthesia Type: MAC Level of consciousness: awake and alert and patient cooperative Pain management: pain level controlled Vital Signs Assessment: post-procedure vital signs reviewed and stable Respiratory status: spontaneous breathing Cardiovascular status: stable Postop Assessment: no apparent nausea or vomiting Anesthetic complications: no     Last Vitals:  Vitals:   05/08/19 0952 05/08/19 1058  BP: 139/68 132/68  Pulse: (!) 55 (!) 55  Resp: 10 16  Temp: 36.6 C 36.6 C  SpO2: 98% 98%    Last Pain:  Vitals:   05/08/19 1058  TempSrc: Oral  PainSc: 0-No pain                 Merlin Ege

## 2019-05-08 NOTE — Transfer of Care (Signed)
Immediate Anesthesia Transfer of Care Note  Patient: Tanya [name]  Procedure(s) Performed: CATARACT EXTRACTION PHACO AND INTRAOCULAR LENS PLACEMENT (IOC) (Right Eye)  Patient Location: Short Stay  Anesthesia Type:MAC  Level of Consciousness: awake, alert  and patient cooperative  Airway & Oxygen Therapy: Patient Spontanous Breathing  Post-op Assessment: Report given to RN and Post -op Vital signs reviewed and stable  Post vital signs: Reviewed and stable  Last Vitals:  Vitals Value Taken Time  BP 132/68   Temp 97.8   Pulse 57   Resp 18   SpO2 96     Last Pain:  Vitals:   05/08/19 0952  TempSrc: Oral  PainSc: 0-No pain         Complications: No apparent anesthesia complications

## 2019-05-12 ENCOUNTER — Encounter (HOSPITAL_COMMUNITY): Payer: Self-pay | Admitting: Ophthalmology

## 2019-05-19 ENCOUNTER — Other Ambulatory Visit (HOSPITAL_COMMUNITY): Payer: Managed Care, Other (non HMO)

## 2019-05-19 ENCOUNTER — Encounter (HOSPITAL_COMMUNITY): Admission: RE | Admit: 2019-05-19 | Payer: Managed Care, Other (non HMO) | Source: Ambulatory Visit

## 2019-05-21 ENCOUNTER — Ambulatory Visit: Payer: Managed Care, Other (non HMO) | Admitting: Family Medicine

## 2019-05-28 ENCOUNTER — Ambulatory Visit: Payer: Managed Care, Other (non HMO) | Admitting: Cardiovascular Disease

## 2019-06-15 ENCOUNTER — Observation Stay (HOSPITAL_COMMUNITY)
Admission: EM | Admit: 2019-06-15 | Discharge: 2019-06-16 | Disposition: A | Discharge status: Home / Self Care | Payer: Medicare Other | Attending: Internal Medicine | Admitting: Internal Medicine

## 2019-06-15 ENCOUNTER — Other Ambulatory Visit: Payer: Self-pay

## 2019-06-15 ENCOUNTER — Emergency Department (HOSPITAL_COMMUNITY): Payer: Medicare Other

## 2019-06-15 ENCOUNTER — Encounter (HOSPITAL_COMMUNITY): Payer: Self-pay

## 2019-06-15 DIAGNOSIS — R51 Headache: Secondary | ICD-10-CM | POA: Diagnosis present

## 2019-06-15 DIAGNOSIS — F419 Anxiety disorder, unspecified: Secondary | ICD-10-CM | POA: Insufficient documentation

## 2019-06-15 DIAGNOSIS — Z79899 Other long term (current) drug therapy: Secondary | ICD-10-CM | POA: Diagnosis not present

## 2019-06-15 DIAGNOSIS — F329 Major depressive disorder, single episode, unspecified: Secondary | ICD-10-CM | POA: Diagnosis not present

## 2019-06-15 DIAGNOSIS — Z20828 Contact with and (suspected) exposure to other viral communicable diseases: Secondary | ICD-10-CM | POA: Insufficient documentation

## 2019-06-15 DIAGNOSIS — E039 Hypothyroidism, unspecified: Secondary | ICD-10-CM | POA: Diagnosis not present

## 2019-06-15 DIAGNOSIS — Z23 Encounter for immunization: Secondary | ICD-10-CM | POA: Diagnosis not present

## 2019-06-15 DIAGNOSIS — K219 Gastro-esophageal reflux disease without esophagitis: Secondary | ICD-10-CM | POA: Diagnosis not present

## 2019-06-15 DIAGNOSIS — G44221 Chronic tension-type headache, intractable: Secondary | ICD-10-CM | POA: Insufficient documentation

## 2019-06-15 DIAGNOSIS — I10 Essential (primary) hypertension: Secondary | ICD-10-CM | POA: Diagnosis not present

## 2019-06-15 DIAGNOSIS — R001 Bradycardia, unspecified: Secondary | ICD-10-CM | POA: Diagnosis not present

## 2019-06-15 DIAGNOSIS — E876 Hypokalemia: Principal | ICD-10-CM | POA: Insufficient documentation

## 2019-06-15 DIAGNOSIS — Z9861 Coronary angioplasty status: Secondary | ICD-10-CM | POA: Diagnosis not present

## 2019-06-15 DIAGNOSIS — I2510 Atherosclerotic heart disease of native coronary artery without angina pectoris: Secondary | ICD-10-CM | POA: Diagnosis present

## 2019-06-15 DIAGNOSIS — Z7902 Long term (current) use of antithrombotics/antiplatelets: Secondary | ICD-10-CM | POA: Insufficient documentation

## 2019-06-15 DIAGNOSIS — E785 Hyperlipidemia, unspecified: Secondary | ICD-10-CM | POA: Diagnosis present

## 2019-06-15 DIAGNOSIS — G43009 Migraine without aura, not intractable, without status migrainosus: Secondary | ICD-10-CM | POA: Diagnosis present

## 2019-06-15 DIAGNOSIS — R519 Headache, unspecified: Secondary | ICD-10-CM | POA: Diagnosis present

## 2019-06-15 LAB — CBC
HCT: 38.9 % (ref 36.0–46.0)
Hemoglobin: 13 g/dL (ref 12.0–15.0)
MCH: 30.8 pg (ref 26.0–34.0)
MCHC: 33.4 g/dL (ref 30.0–36.0)
MCV: 92.2 fL (ref 80.0–100.0)
Platelets: 342 10*3/uL (ref 150–400)
RBC: 4.22 MIL/uL (ref 3.87–5.11)
RDW: 13.1 % (ref 11.5–15.5)
WBC: 5.7 10*3/uL (ref 4.0–10.5)
nRBC: 0 % (ref 0.0–0.2)

## 2019-06-15 LAB — COMPREHENSIVE METABOLIC PANEL
ALT: 21 U/L (ref 0–44)
AST: 28 U/L (ref 15–41)
Albumin: 4.5 g/dL (ref 3.5–5.0)
Alkaline Phosphatase: 54 U/L (ref 38–126)
Anion gap: 12 (ref 5–15)
BUN: 13 mg/dL (ref 8–23)
CO2: 26 mmol/L (ref 22–32)
Calcium: 9.2 mg/dL (ref 8.9–10.3)
Chloride: 99 mmol/L (ref 98–111)
Creatinine, Ser: 0.56 mg/dL (ref 0.44–1.00)
GFR calc Af Amer: >60 mL/min (ref >60)
GFR calc non Af Amer: >60 mL/min (ref >60)
Glucose, Bld: 111 mg/dL — ABNORMAL HIGH (ref 70–99)
Potassium: 2.2 mmol/L — CL (ref 3.5–5.1)
Sodium: 137 mmol/L (ref 135–145)
Total Bilirubin: 0.6 mg/dL (ref 0.3–1.2)
Total Protein: 7.8 g/dL (ref 6.5–8.1)

## 2019-06-15 LAB — DIFFERENTIAL
Abs Immature Granulocytes: 0.01 10*3/uL (ref 0.00–0.07)
Basophils Absolute: 0.1 10*3/uL (ref 0.0–0.1)
Basophils Relative: 1 %
Eosinophils Absolute: 0 10*3/uL (ref 0.0–0.5)
Eosinophils Relative: 1 %
Immature Granulocytes: 0 %
Lymphocytes Relative: 19 %
Lymphs Abs: 1.1 10*3/uL (ref 0.7–4.0)
Monocytes Absolute: 0.3 10*3/uL (ref 0.1–1.0)
Monocytes Relative: 6 %
Neutro Abs: 4.2 10*3/uL (ref 1.7–7.7)
Neutrophils Relative %: 73 %

## 2019-06-15 LAB — RAPID URINE DRUG SCREEN, HOSP PERFORMED
Amphetamines: NOT DETECTED
Barbiturates: NOT DETECTED
Benzodiazepines: POSITIVE — AB
Cocaine: NOT DETECTED
Opiates: POSITIVE — AB
Tetrahydrocannabinol: NOT DETECTED

## 2019-06-15 LAB — URINALYSIS, ROUTINE W REFLEX MICROSCOPIC
Bilirubin Urine: NEGATIVE
Glucose, UA: NEGATIVE mg/dL
Hgb urine dipstick: NEGATIVE
Ketones, ur: NEGATIVE mg/dL
Leukocytes,Ua: NEGATIVE
Nitrite: NEGATIVE
Protein, ur: NEGATIVE mg/dL
Specific Gravity, Urine: 1.033 — ABNORMAL HIGH (ref 1.005–1.030)
pH: 7 (ref 5.0–8.0)

## 2019-06-15 LAB — TROPONIN I (HIGH SENSITIVITY): Troponin I (High Sensitivity): 9 ng/L (ref <18)

## 2019-06-15 LAB — APTT: aPTT: 26 seconds (ref 24–36)

## 2019-06-15 LAB — ETHANOL: Alcohol, Ethyl (B): <10 mg/dL (ref <10)

## 2019-06-15 LAB — I-STAT CREATININE, ED: Creatinine, Ser: 0.6 mg/dL (ref 0.44–1.00)

## 2019-06-15 LAB — PROTIME-INR
INR: 1 (ref 0.8–1.2)
Prothrombin Time: 12.6 seconds (ref 11.4–15.2)

## 2019-06-15 LAB — MAGNESIUM: Magnesium: 2.2 mg/dL (ref 1.7–2.4)

## 2019-06-15 MED ORDER — MORPHINE SULFATE (PF) 4 MG/ML IV SOLN
4.0000 mg | Freq: Once | INTRAVENOUS | Status: AC
  Administered 2019-06-15: 15:00:00 4 mg via INTRAVENOUS
  Filled 2019-06-15: qty 1

## 2019-06-15 MED ORDER — VITAMIN E 45 MG (100 UNIT) PO CAPS
1000.0000 [IU] | ORAL_CAPSULE | Freq: Every day | ORAL | Status: DC
  Filled 2019-06-15 (×2): qty 2

## 2019-06-15 MED ORDER — POLYVINYL ALCOHOL 1.4 % OP SOLN: 1.0000 [drp] | Freq: Three times a day (TID) | OPHTHALMIC | Status: DC | PRN

## 2019-06-15 MED ORDER — INFLUENZA VAC A&B SA ADJ QUAD 0.5 ML IM PRSY
0.5000 mL | PREFILLED_SYRINGE | INTRAMUSCULAR | Status: AC
  Administered 2019-06-16: 0.5 mL via INTRAMUSCULAR
  Filled 2019-06-15 (×2): qty 0.5

## 2019-06-15 MED ORDER — OMEGA-3-ACID ETHYL ESTERS 1 G PO CAPS
1000.0000 mg | ORAL_CAPSULE | Freq: Every day | ORAL | Status: DC
  Administered 2019-06-16: 1000 mg via ORAL
  Filled 2019-06-15: qty 1

## 2019-06-15 MED ORDER — PANTOPRAZOLE SODIUM 40 MG PO TBEC
40.0000 mg | DELAYED_RELEASE_TABLET | Freq: Every day | ORAL | Status: DC
  Administered 2019-06-16: 40 mg via ORAL
  Filled 2019-06-15: qty 1

## 2019-06-15 MED ORDER — VITAMIN C 500 MG PO TABS
1000.0000 mg | ORAL_TABLET | Freq: Every day | ORAL | Status: DC
  Administered 2019-06-16: 09:00:00 1000 mg via ORAL
  Filled 2019-06-15: qty 2

## 2019-06-15 MED ORDER — TEMAZEPAM 7.5 MG PO CAPS
7.5000 mg | ORAL_CAPSULE | Freq: Every evening | ORAL | Status: DC | PRN
  Administered 2019-06-15: 22:00:00 7.5 mg via ORAL
  Filled 2019-06-15: qty 1

## 2019-06-15 MED ORDER — CLONIDINE HCL 0.2 MG PO TABS
0.2000 mg | ORAL_TABLET | Freq: Every day | ORAL | Status: DC
  Administered 2019-06-15: 22:00:00 0.2 mg via ORAL
  Filled 2019-06-15: qty 1

## 2019-06-15 MED ORDER — ONDANSETRON HCL 4 MG/2ML IJ SOLN
4.0000 mg | Freq: Once | INTRAMUSCULAR | Status: AC
  Administered 2019-06-15: 4 mg via INTRAVENOUS
  Filled 2019-06-15: qty 2

## 2019-06-15 MED ORDER — IOHEXOL 350 MG/ML SOLN
100.0000 mL | Freq: Once | INTRAVENOUS | Status: AC | PRN
  Administered 2019-06-15: 16:00:00 100 mL via INTRAVENOUS

## 2019-06-15 MED ORDER — CLOPIDOGREL BISULFATE 75 MG PO TABS
75.0000 mg | ORAL_TABLET | Freq: Every day | ORAL | Status: DC
  Administered 2019-06-16: 75 mg via ORAL
  Filled 2019-06-15: qty 1

## 2019-06-15 MED ORDER — POTASSIUM CHLORIDE CRYS ER 20 MEQ PO TBCR
40.0000 meq | EXTENDED_RELEASE_TABLET | Freq: Once | ORAL | Status: AC
  Administered 2019-06-15: 40 meq via ORAL
  Filled 2019-06-15: qty 2

## 2019-06-15 MED ORDER — DIPHENHYDRAMINE HCL 50 MG/ML IJ SOLN
12.5000 mg | Freq: Once | INTRAMUSCULAR | Status: AC
  Administered 2019-06-15: 12.5 mg via INTRAVENOUS
  Filled 2019-06-15: qty 1

## 2019-06-15 MED ORDER — BUSPIRONE HCL 5 MG PO TABS
5.0000 mg | ORAL_TABLET | Freq: Two times a day (BID) | ORAL | Status: DC
  Administered 2019-06-15 – 2019-06-16 (×2): 5 mg via ORAL
  Filled 2019-06-15 (×2): qty 1

## 2019-06-15 MED ORDER — VITAMIN B-12 1000 MCG PO TABS
1000.0000 ug | ORAL_TABLET | Freq: Every day | ORAL | Status: DC
  Administered 2019-06-16: 09:00:00 1000 ug via ORAL
  Filled 2019-06-15: qty 1

## 2019-06-15 MED ORDER — ROSUVASTATIN CALCIUM 20 MG PO TABS
40.0000 mg | ORAL_TABLET | Freq: Every day | ORAL | Status: DC
  Administered 2019-06-16: 17:00:00 40 mg via ORAL
  Filled 2019-06-15: qty 2

## 2019-06-15 MED ORDER — METOCLOPRAMIDE HCL 5 MG/ML IJ SOLN
5.0000 mg | Freq: Once | INTRAMUSCULAR | Status: AC
  Administered 2019-06-15: 17:00:00 5 mg via INTRAVENOUS
  Filled 2019-06-15: qty 2

## 2019-06-15 MED ORDER — TRIAMTERENE-HCTZ 37.5-25 MG PO TABS
1.0000 | ORAL_TABLET | Freq: Every day | ORAL | Status: DC
  Administered 2019-06-16: 1 via ORAL
  Filled 2019-06-15: qty 1

## 2019-06-15 MED ORDER — ASPIRIN EC 81 MG PO TBEC
81.0000 mg | DELAYED_RELEASE_TABLET | Freq: Every day | ORAL | Status: DC
  Administered 2019-06-15: 22:00:00 81 mg via ORAL
  Filled 2019-06-15: qty 1

## 2019-06-15 MED ORDER — POTASSIUM CHLORIDE 10 MEQ/100ML IV SOLN
10.0000 meq | INTRAVENOUS | Status: AC
  Administered 2019-06-15 (×5): 10 meq via INTRAVENOUS
  Filled 2019-06-15 (×5): qty 100

## 2019-06-15 NOTE — ED Notes (Signed)
ED TO INPATIENT HANDOFF REPORT  ED Nurse Name and Phone #: [phone]  S Name/Age/Gender Tanya [name] 68 y.o. female Room/Bed: APA02/APA02  Code Status   Code Status: Prior  Home/SNF/Other Home Patient oriented to: situation Is this baseline? Yes   Triage Complete: Triage complete  Chief Complaint vomiting  Triage Note Pt presents to ED with complaints of headache, vomiting, and fatigue. Pt states she woke up with HA this am. LKW 0500. Pt states when she woke up this am felt dizzy, headache, N/V   Allergies No Known Allergies  Level of Care/Admitting Diagnosis ED Disposition    ED Disposition Condition [place]: [facility] [id number]  Level of Care: Telemetry [5]  Covid Evaluation: Asymptomatic Screening Protocol (No Symptoms)  Diagnosis: Hypokalemia [id number]  Admitting Physician: Jani Gravel [[id number]]  Attending Physician: Jani Gravel [[id number]]  PT Class (Do Not Modify): Observation [104]  PT Acc Code (Do Not Modify): Observation [10022]       B Medical/Surgery History Past Medical History:  Diagnosis Date  . Anhedonia   . Cystitis   . Elevated liver enzymes   . Fatty liver   . GERD (gastroesophageal reflux disease)   . Hyperlipidemia   . Hypertension   . Insomnia   . Unspecified hypothyroidism   . Vaginitis    Past Surgical History:  Procedure Laterality Date  . CATARACT EXTRACTION W/PHACO Right 05/08/2019   Procedure: CATARACT EXTRACTION PHACO AND INTRAOCULAR LENS PLACEMENT (IOC);  Surgeon: Baruch Goldmann, MD;  Location: [facility];  Service: Ophthalmology;  Laterality: Right;  CDE: 10.02  . COLONOSCOPY N/A 02/08/2016   Procedure: COLONOSCOPY;  Surgeon: [name], MD;  Location: [facility];  Service: Endoscopy;  Laterality: N/A;  1200 - moved to 11:45 - Ann to notify  . CORONARY STENT INTERVENTION N/A 06/06/2018   Procedure: CORONARY STENT INTERVENTION;  Surgeon: [name], [name], MD;  Location: [place] [facility] LAB;   Service: Cardiovascular;  Laterality: N/A;  . INTRAVASCULAR PRESSURE WIRE/FFR STUDY N/A 06/06/2018   Procedure: INTRAVASCULAR PRESSURE WIRE/FFR STUDY;  Surgeon: [name], [name], MD;  Location: [place] CV LAB;  Service: Cardiovascular;  Laterality: N/A;  . LEFT HEART CATH AND CORONARY ANGIOGRAPHY N/A 04/24/2019   Procedure: LEFT HEART CATH AND CORONARY ANGIOGRAPHY;  Surgeon: Belva Crome, MD;  Location: [place] [facility] LAB;  Service: Cardiovascular;  Laterality: N/A;  . LEFT HEART CATHETERIZATION WITH CORONARY ANGIOGRAM N/A 11/19/2014   Procedure: LEFT HEART CATHETERIZATION WITH CORONARY ANGIOGRAM;  Surgeon: [name], MD;  Location: [facility] CATH LAB;  Service: Cardiovascular;  Laterality: N/A;  . RIGHT/LEFT HEART CATH AND CORONARY ANGIOGRAPHY N/A 06/06/2018   Procedure: RIGHT/LEFT HEART CATH AND CORONARY ANGIOGRAPHY;  Surgeon: [name], [name], MD;  Location: [place] CV LAB;  Service: Cardiovascular;  Laterality: N/A;     A IV Location/Drains/Wounds Patient Lines/Drains/Airways Status   Active Line/Drains/Airways    Name:   Placement date:   Placement time:   Site:   Days:   Peripheral IV 11/19/14 Left Hand   11/19/14    0812    Hand   1669   Peripheral IV 11/19/14 Left Antecubital   11/19/14    0812    Antecubital   1669   Peripheral IV 06/15/19 Right Antecubital   06/15/19    1445    Antecubital   less than 1   Incision (Closed) 05/08/19 Eye Right   05/08/19    1048  38          Intake/Output Last 24 hours  Intake/Output Summary (Last 24 hours) at 06/15/2019 1901 Last data filed at 06/15/2019 1818 Gross per 24 hour  Intake 96.28 ml  Output -  Net 96.28 ml    Labs/Imaging Results for orders placed or performed during the hospital encounter of 06/15/19 (from the past 48 hour(s))  Ethanol     Status: None   Collection Time: 06/15/19  2:42 PM  Result Value Ref Range   Alcohol, Ethyl (B) <10 <10 mg/dL    Comment: (NOTE) Lowest detectable limit for serum alcohol is 10  mg/dL. For medical purposes only. Performed at [facility], [address] [place], [place] [address]   Protime-INR     Status: None   Collection Time: 06/15/19  2:42 PM  Result Value Ref Range   Prothrombin Time 12.6 11.4 - 15.2 seconds   INR 1.0 0.8 - 1.2    Comment: (NOTE) INR goal varies based on device and disease states. Performed at [facility], [address] [place], [place] [address]   APTT     Status: None   Collection Time: 06/15/19  2:42 PM  Result Value Ref Range   aPTT 26 24 - 36 seconds    Comment: Performed at [facility], [address], [place], [place] [address]  CBC     Status: None   Collection Time: 06/15/19  2:42 PM  Result Value Ref Range   WBC 5.7 4.0 - 10.5 K/uL   RBC 4.22 3.87 - 5.11 MIL/uL   Hemoglobin 13.0 12.0 - 15.0 g/dL   HCT 38.9 36.0 - 46.0 %   MCV 92.2 80.0 - 100.0 fL   MCH 30.8 26.0 - 34.0 pg   MCHC 33.4 30.0 - 36.0 g/dL   RDW 13.1 11.5 - 15.5 %   Platelets 342 150 - 400 K/uL   nRBC 0.0 0.0 - 0.2 %    Comment: Performed at [facility], [address], [place], [place] [address]  Differential     Status: None   Collection Time: 06/15/19  2:42 PM  Result Value Ref Range   Neutrophils Relative % 73 %   Neutro Abs 4.2 1.7 - 7.7 K/uL   Lymphocytes Relative 19 %   Lymphs Abs 1.1 0.7 - 4.0 K/uL   Monocytes Relative 6 %   Monocytes Absolute 0.3 0.1 - 1.0 K/uL   Eosinophils Relative 1 %   Eosinophils Absolute 0.0 0.0 - 0.5 K/uL   Basophils Relative 1 %   Basophils Absolute 0.1 0.0 - 0.1 K/uL   Immature Granulocytes 0 %   Abs Immature Granulocytes 0.01 0.00 - 0.07 K/uL    Comment: Performed at [facility], [address] [place], [place] [address]  Comprehensive metabolic panel     Status: Abnormal   Collection Time: 06/15/19  2:42 PM  Result Value Ref Range   Sodium 137 135 - 145 mmol/L   Potassium 2.2 (LL) 3.5 - 5.1 mmol/L    Comment: CRITICAL RESULT CALLED TO, READ BACK BY AND VERIFIED WITH: MOORE,M AT 1605 ON 9.14.20 BY ISLEY,B     Chloride 99 98 - 111 mmol/L   CO2 26 22 - 32 mmol/L   Glucose, Bld 111 (H) 70 - 99 mg/dL   BUN 13 8 - 23 mg/dL   Creatinine, Ser 0.56 0.44 - 1.00 mg/dL   Calcium 9.2 8.9 - 10.3 mg/dL   Total Protein 7.8 6.5 - 8.1 g/dL  Albumin 4.5 3.5 - 5.0 g/dL   AST 28 15 - 41 U/L   ALT 21 0 - 44 U/L   Alkaline Phosphatase 54 38 - 126 U/L   Total Bilirubin 0.6 0.3 - 1.2 mg/dL   GFR calc non Af Amer >60 >60 mL/min   GFR calc Af Amer >60 >60 mL/min   Anion gap 12 5 - 15    Comment: Performed at [facility], [address] [place], [place] [address]  Magnesium     Status: None   Collection Time: 06/15/19  2:42 PM  Result Value Ref Range   Magnesium 2.2 1.7 - 2.4 mg/dL    Comment: Performed at [facility], [address], [place], [place] [address]  I-stat Creatinine, ED     Status: None   Collection Time: 06/15/19  2:53 PM  Result Value Ref Range   Creatinine, Ser 0.60 0.44 - 1.00 mg/dL   Ct Angio Head W/cm &/or Wo Cm  Result Date: 06/15/2019 CLINICAL DATA:  Headache, acute, normal neuro exam.a EXAM: CT ANGIOGRAPHY HEAD AND NECK TECHNIQUE: Multidetector CT imaging of the head and neck was performed using the standard protocol during bolus administration of intravenous contrast. Multiplanar CT image reconstructions and MIPs were obtained to evaluate the vascular anatomy. Carotid stenosis measurements (when applicable) are obtained utilizing NASCET criteria, using the distal internal carotid diameter as the denominator. CONTRAST:  170mL OMNIPAQUE IOHEXOL 350 MG/ML SOLN COMPARISON:  Report for brain MRI 07/22/2001 (images unavailable) FINDINGS: CT HEAD FINDINGS Brain: There is no acute intracranial hemorrhage. No demarcated cortical infarction. No evidence of intracranial mass. No midline shift or extra-axial fluid collection. Cerebral volume is normal for age. Partially empty sella turcica. Vascular: Reported separately Skull: No calvarial fracture. Nonspecific 6 mm well-circumscribed lytic lucency  within the right frontal calvarium. Sinuses: Mild ethmoid sinus mucosal thickening. No significant mastoid effusion. Orbits: Imaged globes and orbits demonstrate no acute abnormality. Review of the MIP images confirms the above findings CTA NECK FINDINGS Aortic arch: Standard branching. Imaged portions of the aortic arch demonstrate no aneurysm or evidence of dissection. Mild scattered plaque at the origins of the great vessels of the neck. Right carotid system: The common carotid artery is widely patent to the bifurcation. Calcified and noncalcified plaque at the bifurcation without significant narrowing of the proximal cervical ICA. Distal to the bifurcation, the cervical ICA is widely patent. Left carotid system: The common carotid artery is widely patent to the bifurcation. Calcified and noncalcified plaque at the bifurcation without significant narrowing of the proximal cervical ICA. Distal to the bifurcation, the cervical ICA is widely patent. Vertebral arteries: The bilateral vertebral arteries are patent within the neck to the level of the foramen magnum without significant stenosis. The left vertebral artery is slightly dominant. Skeleton: Reversal of the expected cervical lordosis. Cervical spondylosis. No acute bony abnormality. Other neck: Mild generalized prominence of the thyroid gland. 3 mm left thyroid lobe nodule not meeting consensus criteria for ultrasound follow-up. No cervical lymphadenopathy. Upper chest: No confluent consolidation within the imaged lung apices. Review of the MIP images confirms the above findings CTA HEAD FINDINGS Anterior circulation: Scattered calcified atherosclerotic plaque within the bilateral intracranial internal carotid artery siphons with regions of no more than mild luminal narrowing. The right middle and anterior cerebral arteries are patent without significant proximal stenosis. The left middle and anterior cerebral arteries are patent without significant proximal  stenosis. 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 A 1-2 mm vascular protrusion arising inferiorly from the paraclinoid right internal carotid artery is favored to reflect an infundibulum. Tiny aneurysm not excluded. Posterior circulation: The intracranial vertebral arteries are patent without significant stenosis, as is the basilar artery.The posterior cerebral arteries are patent bilaterally without significant proximal stenosis. Venous sinuses: Within the limitations of contrast timing, no evidence of thrombosis. Anatomic variants: None significant Review of the MIP images confirms the above findings IMPRESSION: CT head: No CT evidence of acute intracranial abnormality. Nonspecific 6 mm well-circumscribed lytic lucency within the right frontal calvarium. CTA head: No intracranial large vessel occlusion or proximal high-grade arterial stenosis. Atherosclerotic calcification of the carotid artery siphons with regions of no more than mild luminal narrowing. 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 A 1-2 mm vascular protrusion arising inferiorly from the paraclinoid right ICA is favored to reflect an infundibulum, with tiny aneurysm not excluded. CTA neck: Mild atherosclerotic plaque within the visualized aortic arch and proximal branch vessels. Calcified and noncalcified plaque also present at the carotid bifurcations bilaterally. The bilateral common carotid, internal carotid and vertebral arteries are patent within the neck without significant stenosis. Mild generalized prominence of the thyroid gland. Correlate clinically and with relevant laboratory values. 3 mm left thyroid lobe nodule not meeting consensus criteria for ultrasound follow-up. Electronically Signed   By: Kellie Simmering   On: 06/15/2019 17:09   Ct Angio Neck W And/or Wo Contrast  Result Date: 06/15/2019 CLINICAL DATA:   Headache, acute, normal neuro exam.a EXAM: CT ANGIOGRAPHY HEAD AND NECK TECHNIQUE: Multidetector CT imaging of the head and neck was performed using the standard protocol during bolus administration of intravenous contrast. Multiplanar CT image reconstructions and MIPs were obtained to evaluate the vascular anatomy. Carotid stenosis measurements (when applicable) are obtained utilizing NASCET criteria, using the distal internal carotid diameter as the denominator. CONTRAST:  193mL OMNIPAQUE IOHEXOL 350 MG/ML SOLN COMPARISON:  Report for brain MRI 07/22/2001 (images unavailable) FINDINGS: CT HEAD FINDINGS Brain: There is no acute intracranial hemorrhage. No demarcated cortical infarction. No evidence of intracranial mass. No midline shift or extra-axial fluid collection. Cerebral volume is normal for age. Partially empty sella turcica. Vascular: Reported separately Skull: No calvarial fracture. Nonspecific 6 mm well-circumscribed lytic lucency within the right frontal calvarium. Sinuses: Mild ethmoid sinus mucosal thickening. No significant mastoid effusion. Orbits: Imaged globes and orbits demonstrate no acute abnormality. Review of the MIP images confirms the above findings CTA NECK FINDINGS Aortic arch: Standard branching. Imaged portions of the aortic arch demonstrate no aneurysm or evidence of dissection. Mild scattered plaque at the origins of the great vessels of the neck. Right carotid system: The common carotid artery is widely patent to the bifurcation. Calcified and noncalcified plaque at the bifurcation without significant narrowing of the proximal cervical ICA. Distal to the bifurcation, the cervical ICA is widely patent. Left carotid system: The common carotid artery is widely patent to the bifurcation. Calcified and noncalcified plaque at the bifurcation without significant narrowing of the proximal cervical ICA. Distal to the bifurcation, the cervical ICA is widely patent. Vertebral arteries: The  bilateral vertebral arteries are patent within the neck to the level of the foramen magnum without significant stenosis. The left vertebral artery is slightly dominant. Skeleton: Reversal of the expected cervical lordosis. Cervical spondylosis. No acute bony abnormality. Other neck: Mild generalized prominence of the thyroid gland. 3 mm left thyroid lobe nodule not meeting consensus criteria for ultrasound follow-up.  No cervical lymphadenopathy. Upper chest: No confluent consolidation within the imaged lung apices. Review of the MIP images confirms the above findings CTA HEAD FINDINGS Anterior circulation: Scattered calcified atherosclerotic plaque within the bilateral intracranial internal carotid artery siphons with regions of no more than mild luminal narrowing. The right middle and anterior cerebral arteries are patent without significant proximal stenosis. The left middle and anterior cerebral arteries are patent without significant proximal stenosis. 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 A 1-2 mm vascular protrusion arising inferiorly from the paraclinoid right internal carotid artery is favored to reflect an infundibulum. Tiny aneurysm not excluded. Posterior circulation: The intracranial vertebral arteries are patent without significant stenosis, as is the basilar artery.The posterior cerebral arteries are patent bilaterally without significant proximal stenosis. Venous sinuses: Within the limitations of contrast timing, no evidence of thrombosis. Anatomic variants: None significant Review of the MIP images confirms the above findings IMPRESSION: CT head: No CT evidence of acute intracranial abnormality. Nonspecific 6 mm well-circumscribed lytic lucency within the right frontal calvarium. CTA head: No intracranial large vessel occlusion or proximal high-grade arterial stenosis. Atherosclerotic calcification of the  carotid artery siphons with regions of no more than mild luminal narrowing. 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 A 1-2 mm vascular protrusion arising inferiorly from the paraclinoid right ICA is favored to reflect an infundibulum, with tiny aneurysm not excluded. CTA neck: Mild atherosclerotic plaque within the visualized aortic arch and proximal branch vessels. Calcified and noncalcified plaque also present at the carotid bifurcations bilaterally. The bilateral common carotid, internal carotid and vertebral arteries are patent within the neck without significant stenosis. Mild generalized prominence of the thyroid gland. Correlate clinically and with relevant laboratory values. 3 mm left thyroid lobe nodule not meeting consensus criteria for ultrasound follow-up. Electronically Signed   By: Kellie Simmering   On: 06/15/2019 17:09    Pending Labs Unresulted Labs (From admission, onward)    Start     Ordered   06/15/19 1824  SARS CORONAVIRUS 2 (TAT 6-24 HRS) Nasopharyngeal Nasopharyngeal Swab  (Asymptomatic/Tier 2 Patients Labs)  Once,   STAT    Question Answer Comment  Is this test for diagnosis or screening Screening   Symptomatic for COVID-19 as defined by CDC No   Hospitalized for COVID-19 No   Admitted to ICU for COVID-19 No   Previously tested for COVID-19 Yes   Resident in a congregate (group) care setting No   Employed in healthcare setting No   Pregnant No      06/15/19 1823   06/15/19 1403  Urine rapid drug screen (hosp performed)  ONCE - STAT,   STAT     06/15/19 1407   06/15/19 1403  Urinalysis, Routine w reflex microscopic  ONCE - STAT,   STAT     06/15/19 1407          Vitals/Pain Today's Vitals   06/15/19 1700 06/15/19 1826 06/15/19 1844 06/15/19 1900  BP:   (!) 122/59   Pulse: (!) 57 (!) 51    Resp: (!) 21 12 16    Temp:      TempSrc:      SpO2: 100% 98% 99%   Weight:      Height:      PainSc:   3  0-No pain    Isolation  Precautions No active isolations  Medications Medications  potassium chloride 10 mEq in 100  mL IVPB (10 mEq Intravenous New Bag/Given 06/15/19 1841)  potassium chloride SA (K-DUR) CR tablet 40 mEq (has no administration in time range)  ondansetron (ZOFRAN) injection 4 mg (4 mg Intravenous Given 06/15/19 1446)  morphine 4 MG/ML injection 4 mg (4 mg Intravenous Given 06/15/19 1446)  iohexol (OMNIPAQUE) 350 MG/ML injection 100 mL (100 mLs Intravenous Contrast Given 06/15/19 1538)  metoCLOPramide (REGLAN) injection 5 mg (5 mg Intravenous Given 06/15/19 1702)  diphenhydrAMINE (BENADRYL) injection 12.5 mg (12.5 mg Intravenous Given 06/15/19 1702)    Mobility walks Low fall risk   Focused Assessments    R Recommendations: See Admitting Provider Note  Report given to:   Additional Notes:

## 2019-06-15 NOTE — ED Triage Notes (Signed)
Pt presents to ED with complaints of headache, vomiting, and fatigue. Pt states she woke up with HA this am. LKW 0500. Pt states when she woke up this am felt dizzy, headache, N/V

## 2019-06-15 NOTE — ED Notes (Signed)
Date and time results received: 06/15/19 1605 (use smartphrase ".now" to insert current time)  Test: potassium Critical Value: 2.2  Name of Provider Notified: Kennith Maes PA  Orders Received? Or Actions Taken?: n/a

## 2019-06-15 NOTE — ED Provider Notes (Signed)
[facility] EMERGENCY DEPARTMENT Provider Note   CSN: [id number] Arrival date & time: 06/15/19  1340     History   Chief Complaint Chief Complaint  Patient presents with  . Headache    HPI Tanya [name] is a 68 y.o. female with a history of GERD, hypertension, hyperlipidemia, hypothyroidism, & CAD who presents to the emergency department with complaints of headache that began at 9 AM this morning.  Patient states that she woke up without a headache, but when she got out of bed and started ambulating she developed a fairly quick onset severe headache to the frontal region.  This pain has been constant since it began, worse with movement, no alleviating factors.  She has associated nausea with 4 episodes of emesis as well as lightheadedness/dizziness, but states this is not like the room spinning.  States she has had somewhat similar headaches.  She denies visual disturbance, numbness, tingling, weakness, syncope, head injury, hematemesis, diarrhea, abdominal pain, chest pain, or dyspnea.  Patient is on Plavix.    HPI  Past Medical History:  Diagnosis Date  . Anhedonia   . Cystitis   . Elevated liver enzymes   . Fatty liver   . GERD (gastroesophageal reflux disease)   . Hyperlipidemia   . Hypertension   . Insomnia   . Unspecified hypothyroidism   . Vaginitis     Patient Active Problem List   Diagnosis Date Noted  . Need for shingles vaccine 03/25/2019  . Educated About Covid-19 Virus Infection 02/15/2019  . Acute sciatica 02/10/2019  . CAD (coronary artery disease), native coronary artery 06/06/2018  . Headache 03/08/2018  . Primary osteoarthritis of both hands 02/13/2018  . Primary osteoarthritis of both knees 02/13/2018  . Anxiety 02/06/2017  . Pruritus 07/20/2016  . Arthritis 07/20/2016  . Tubular adenoma of colon 02/11/2016  . Coronary artery calcification seen on CAT scan - Moderate LAD lesion noted 11/16/2014  . Abnormal CT scan, chest 11/08/2014  . IGT  (impaired glucose tolerance) 06/11/2014  . Osteopenia 01/27/2014  . Microscopic hematuria 01/05/2013  . Urinary incontinence, mixed 01/05/2013  . GERD (gastroesophageal reflux disease) 12/16/2009  . Hyperlipemia 04/09/2008  . Essential hypertension 04/09/2008  . Insomnia disorder 04/09/2008    Past Surgical History:  Procedure Laterality Date  . CATARACT EXTRACTION W/PHACO Right 05/08/2019   Procedure: CATARACT EXTRACTION PHACO AND INTRAOCULAR LENS PLACEMENT (IOC);  Surgeon: Baruch Goldmann, MD;  Location: [facility];  Service: Ophthalmology;  Laterality: Right;  CDE: 10.02  . COLONOSCOPY N/A 02/08/2016   Procedure: COLONOSCOPY;  Surgeon: [name], MD;  Location: [facility] SUITE;  Service: Endoscopy;  Laterality: N/A;  1200 - moved to 11:45 - Ann to notify  . CORONARY STENT INTERVENTION N/A 06/06/2018   Procedure: CORONARY STENT INTERVENTION;  Surgeon: [name], [name], MD;  Location: [place] [facility];  Service: Cardiovascular;  Laterality: N/A;  . INTRAVASCULAR PRESSURE WIRE/FFR STUDY N/A 06/06/2018   Procedure: INTRAVASCULAR PRESSURE WIRE/FFR STUDY;  Surgeon: [name], [name], MD;  Location: [place] [facility];  Service: Cardiovascular;  Laterality: N/A;  . LEFT HEART CATH AND CORONARY ANGIOGRAPHY N/A 04/24/2019   Procedure: LEFT HEART CATH AND CORONARY ANGIOGRAPHY;  Surgeon: Belva Crome, MD;  Location: [place] [facility];  Service: Cardiovascular;  Laterality: N/A;  . LEFT HEART CATHETERIZATION WITH CORONARY ANGIOGRAM N/A 11/19/2014   Procedure: LEFT HEART CATHETERIZATION WITH CORONARY ANGIOGRAM;  Surgeon: [name], MD;  Location: [facility] CATH LAB;  Service: Cardiovascular;  Laterality: N/A;  .  RIGHT/LEFT HEART CATH AND CORONARY ANGIOGRAPHY N/A 06/06/2018   Procedure: RIGHT/LEFT HEART CATH AND CORONARY ANGIOGRAPHY;  Surgeon: [name], [name], MD;  Location: [place] [facility];  Service: Cardiovascular;  Laterality: N/A;     OB History   No obstetric history on file.      Home  Medications    Prior to Admission medications   Medication Sig Start Date End Date Taking? Authorizing Provider  acetaminophen (TYLENOL) 500 MG tablet Take 500 mg by mouth every 6 (six) hours as needed for headache.    [provider]  Ascorbic Acid (VITAMIN C) 1000 MG tablet Take 1,000 mg by mouth daily.     [provider]  aspirin EC 81 MG tablet Take 81 mg by mouth at bedtime.    [provider]  busPIRone (BUSPAR) 5 MG tablet TAKE 1 TABLET TWICE A DAY (NEED FOLLOW UP APPOINTMENT WITH PCP) Patient taking differently: Take 5 mg by mouth 2 (two) times a day.  09/08/18   [name], MD  cloNIDine (CATAPRES) 0.2 MG tablet Take one tablet by mouth at bedtime for blood pressure Patient taking differently: Take 0.2 mg by mouth daily.  03/25/19   [name], MD  clopidogrel (PLAVIX) 75 MG tablet Take 1 tablet (75 mg total) by mouth daily. 02/25/19   Herminio Commons, MD  fish oil-omega-3 fatty acids 1000 MG capsule Take 1,000 mg by mouth daily.     [provider]  nitroGLYCERIN (NITROSTAT) 0.4 MG SL tablet Place 1 tablet (0.4 mg total) under the tongue every 5 (five) minutes as needed. Patient taking differently: Place 0.4 mg under the tongue every 5 (five) minutes x 3 doses as needed for chest pain.  06/06/18   Cheryln Manly, NP  pantoprazole (PROTONIX) 40 MG tablet Take 1 tablet (40 mg total) by mouth daily. 02/25/19   Herminio Commons, MD  Polyethyl Glycol-Propyl Glycol (LUBRICANT EYE DROPS) 0.4-0.3 % SOLN Place 1 drop into both eyes 3 (three) times daily as needed (dry/irritated eyes.).    [provider]  rosuvastatin (CRESTOR) 40 MG tablet Take 1 tablet (40 mg total) by mouth daily. 03/25/19   [name], MD  temazepam (RESTORIL) 7.5 MG capsule Take 1 capsule (7.5 mg total) by mouth at bedtime as needed for sleep. 03/25/19   [name], MD  triamterene-hydrochlorothiazide (MAXZIDE-25) 37.5-25 MG tablet Take 1  tablet by mouth daily. 03/25/19   [name], MD  vitamin B-12 (CYANOCOBALAMIN) 1000 MCG tablet Take 1,000 mcg by mouth daily.    [provider]  vitamin E 1000 UNIT capsule Take 1 tablet by mouth daily.     [provider]    Family History Family History  Problem Relation Age of Onset  . Dementia Mother   . Heart disease Mother        heart attack   . Hypertension Mother   . Heart attack Father   . Heart disease Father        heart attack   . Hypertension Father     Social History Social History   Tobacco Use  . Smoking status: Never Smoker  . Smokeless tobacco: Never Used  Substance Use Topics  . Alcohol use: No    Alcohol/week: 0.0 standard drinks  . Drug use: No     Allergies   Patient has no known allergies.   Review of Systems Review of Systems  Constitutional: Negative for chills and fever.  Eyes: Negative for visual disturbance.  Respiratory: Negative for shortness of breath.   Cardiovascular: Negative for chest pain.  Gastrointestinal: Positive for nausea and vomiting. Negative for abdominal pain, anal bleeding, blood in stool, constipation and diarrhea.  Neurological: Positive for dizziness, light-headedness and headaches. Negative for seizures, syncope, weakness and numbness.  All other systems reviewed and are negative.    Physical Exam Updated Vital Signs BP (!) 181/96 (BP Location: Right Arm)   Pulse (!) 59   Temp 98.9 F (37.2 C) (Tympanic)   Resp 19   Ht 5\' 2"  (1.575 m)   Wt 61.2 kg   SpO2 96%   BMI 24.69 kg/m   Physical Exam Vitals signs and nursing note reviewed.  Constitutional:      General: She is not in acute distress.    Appearance: She is well-developed. She is not toxic-appearing.  HENT:     Head: Normocephalic and atraumatic.  Eyes:     General:        Right eye: No discharge.        Left eye: No discharge.     Conjunctiva/sclera: Conjunctivae normal.  Neck:     Musculoskeletal: Neck supple.  No neck rigidity.  Cardiovascular:     Rate and Rhythm: Normal rate and regular rhythm.  Pulmonary:     Effort: Pulmonary effort is normal. No respiratory distress.     Breath sounds: Normal breath sounds. No wheezing, rhonchi or rales.  Abdominal:     General: There is no distension.     Palpations: Abdomen is soft.     Tenderness: There is no abdominal tenderness. There is no guarding.  Skin:    General: Skin is warm and dry.     Findings: No rash.  Neurological:     Mental Status: She is alert.     Comments: Alert. Clear speech. No facial droop. CNIII-XII grossly intact. Bilateral upper and lower extremities' sensation grossly intact. 5/5 symmetric strength with grip strength and with plantar and dorsi flexion bilaterally. Normal finger to nose bilaterally. Negative pronator drift. Negative Romberg sign. Gait is steady and intact.   Psychiatric:        Behavior: Behavior normal.    ED Treatments / Results  Labs (all labs ordered are listed, but only abnormal results are displayed) Labs Reviewed  COMPREHENSIVE METABOLIC PANEL - Abnormal; Notable for the following components:      Result Value   Potassium 2.2 (*)    Glucose, Bld 111 (*)    All other components within normal limits  ETHANOL  PROTIME-INR  APTT  CBC  DIFFERENTIAL  RAPID URINE DRUG SCREEN, HOSP PERFORMED  URINALYSIS, ROUTINE W REFLEX MICROSCOPIC  MAGNESIUM  I-STAT CREATININE, ED    EKG None   Radiology No results found.  Procedures .Critical Care Performed by: Amaryllis Dyke, PA-C Authorized by: Amaryllis Dyke, PA-C    CRITICAL CARE Performed by: Kennith Maes   Total critical care time: 30 minutes  Critical care time was exclusive of separately billable procedures and treating other patients.  Critical care was necessary to treat or prevent imminent or life-threatening deterioration.  Critical care was time spent personally by me on the following activities:  development of treatment plan with patient and/or surrogate as well as nursing, discussions with consultants, evaluation of patient's response to treatment, examination of patient, obtaining history from patient or surrogate, ordering and performing treatments and interventions, ordering and review of laboratory studies, ordering and review of radiographic studies, pulse  oximetry and re-evaluation of patient's condition.  including critical care time)  Medications Ordered in ED Medications  potassium chloride 10 mEq in 100 mL IVPB (10 mEq Intravenous New Bag/Given 06/15/19 1708)  ondansetron (ZOFRAN) injection 4 mg (4 mg Intravenous Given 06/15/19 1446)  morphine 4 MG/ML injection 4 mg (4 mg Intravenous Given 06/15/19 1446)  iohexol (OMNIPAQUE) 350 MG/ML injection 100 mL (100 mLs Intravenous Contrast Given 06/15/19 1538)  metoCLOPramide (REGLAN) injection 5 mg (5 mg Intravenous Given 06/15/19 1702)  diphenhydrAMINE (BENADRYL) injection 12.5 mg (12.5 mg Intravenous Given 06/15/19 1702)     Initial Impression / Assessment and Plan / ED Course  I have reviewed the triage vital signs and the nursing notes.  Pertinent labs & imaging results that were available during my care of the patient were reviewed by me and considered in my medical decision making (see chart for details).   Patient presents to the ED w/ headache & N/V.  Vitals WNL with the exception of elevated BP.  Exam w/o focal neuro deficits.  Plan for labs & CTA of the head/neck. Morphine/zofran ordered for symptomatic control.   Labs reviewed:  CBC: no anemia/leukocytosis.  CMP: Critical hypokalemia @ 2.2.  PT/INR/APT: WNL Ethanol: WNL  Critical Hypokalemia--> add on magnesium, start IV potassium as patient is still vomiting on re-assessment. EKG w/ sinus rhythm QTc 467- will administer reglan & benadryl per discussion w/ Dr. [name] for further symptomatic control.   Radiology reports not crossing over- called & spoke with  radiology admin who has read reports to me via telephone:  CTA head/neck Head: No intracranial large vessel occlusion or proximal high grade arterial stenosis. atheroclerotic calcification carotid artery siphons w/ regions of no more than mild luminal  narrowing. 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 1-2 mm vascular protrusion arising inferiorly from the paraclinoid of the right ICA is favored to reflect a infundibulum with tiny aneurysm not excluded.  Neck: Mild atherosclerotic plaque within the visualized aortic arch and proximal branch vessels. Calcified and noncalcified plaque also present at the carotid bifurcation bilaterally. The bilateral common carotid, internal carotic, & vertebral arteries are patent within the neck without significant stenosis. Mild generalized prominence of the thyroid gland. Correlate clinically and with relevant laboratory values. 80mm left thyroid lobe nodule not meeting sensus criteria for ultrasound follow up.   18:00: RE-EVAL: Patient feeling much better, headache mostly resolved, has not had any further emesis s/p medications.   Work-up overall re-assuring in terms of headache, CT imaging w/o hemorrhage or occlusion, no focal neuro deficits, symptomatically substantially improved, however given patient's significant hypokalemia will consult for admission. Both IV & oral potassium have been started in the ED. COVID swab sent.   18:40: CONSULT: Discussed w/ hospitliast Dr. Maudie Mercury- accepts admission.   Owen A Mcfayden was evaluated in Emergency Department on 06/15/2019 for the symptoms described in the history of present illness. He/she was evaluated in the context of the global COVID-19 pandemic, which necessitated consideration that the patient might be at risk for infection with the SARS-CoV-2 virus that causes COVID-19. Institutional protocols and algorithms that pertain to the evaluation of patients at risk for COVID-19 are in a  state of rapid change based on information released by regulatory bodies including the CDC and federal and state organizations. These policies and algorithms were followed during the patient's care in the ED.  Final Clinical Impressions(s) / ED Diagnoses   Final diagnoses:  Hypokalemia  Bad headache  ED Discharge Orders    None       Leafy Kindle 06/15/19 1840    Milton Ferguson, MD 06/18/19 1217

## 2019-06-15 NOTE — H&P (Signed)
TRH H&P    Patient Demographics:    Tanya [name], is a 68 y.o. female  MRN: [id number]  DOB - [birth year]  Admit Date - 06/15/2019  Referring MD/NP/PA:  Kennith Maes  Outpatient Primary MD for the patient is [name], MD  Patient coming from:  home  Chief complaint-  headache   HPI:    Tanya [name]  is a 68 y.o. female,  w hypertension, hyperlipidemia, CAD, Jerrye Bushy, apparently c/o headache, frontal bilateral starting earlier today, associated with n/v,  Pt denies vision change, photophobia, neck stiffness,  numbness, tingling, weakness, fever, chills, cough, cp, palp, sob, abd pain, diarrhea, brbpr, black stool.   In ED,  T 98.9, P 59 R 19 Bp 181/96 pox 96% on RA Wt 61.2kg   CT HEAD FINDINGS  Brain: There is no acute intracranial hemorrhage. No demarcated cortical infarction. No evidence of intracranial mass. No midline shift or extra-axial fluid collection. Cerebral volume is normal for age. Partially empty sella turcica.  CTA brain/ neck No intracranial large vessel occlusion or proximal high-grade arterial stenosis.  Atherosclerotic calcification of the carotid artery siphons with regions of no more than mild luminal narrowing.  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 A 1-2 mm vascular protrusion arising inferiorly from the paraclinoid right ICA is favored to reflect an infundibulum, with tiny aneurysm not excluded.  CTA neck:  Mild atherosclerotic plaque within the visualized aortic arch and proximal branch vessels. Calcified and noncalcified plaque also present at the carotid bifurcations bilaterally.  The bilateral common carotid, internal carotid and vertebral arteries are patent within the neck without significant stenosis.  Mild generalized prominence of the thyroid gland. Correlate clinically and with relevant  laboratory values. 3 mm left thyroid lobe nodule not meeting consensus criteria for ultrasound follow-up.  ETOH <10 INR 1.0 PTT 26  Wbc 5.7, Hgb 13.0, Plt 342 Na 137, K 2.2,  Bun 13, Creatinine 0.56 Ast 28, Alt 21 Magnesium 2.2  Pt will be admitted for headache, now resolved, and hypokalemia.    Reviewed cardiac catherization 04/24/2019  Prox LAD lesion is 80% stenosed.  Post intervention, there is a 0% residual stenosis.  A drug-eluting stent was successfully placed using a STENT SYNERGY DES 3.5X20.  A drug-eluting stent was successfully placed using a STENT SYNERGY DES 3X12.  The left ventricular systolic function is normal.  LV end diastolic pressure is normal.  The left ventricular ejection fraction is 55-65% by visual estimate.   1. Single vessel obstructive CAD involving the mid LAD immediately after a large diagonal. Abnormal FFR of 0.68. 2. Normal LV function 3. Low LV filling pressures 4. Normal right heart pressures 5. Normal cardiac output 6. Successful PCI of the proximal to mid LAD with DES x 2.     Review of systems:    In addition to the HPI above,  No Fever-chills, No Headache, No changes with Vision or hearing, No problems swallowing food or Liquids, No Chest pain, Cough or Shortness of Breath, No Abdominal pain, No Nausea  or Vomiting, bowel movements are regular, No Blood in stool or Urine, No dysuria, No new skin rashes or bruises, No new joints pains-aches,  No new weakness, tingling, numbness in any extremity, No recent weight gain or loss, No polyuria, polydypsia or polyphagia, No significant Mental Stressors.  All other systems reviewed and are negative.    Past History of the following :    Past Medical History:  Diagnosis Date   Anhedonia    Cystitis    Elevated liver enzymes    Fatty liver    GERD (gastroesophageal reflux disease)    Hyperlipidemia    Hypertension    Insomnia    Unspecified hypothyroidism     Vaginitis       Past Surgical History:  Procedure Laterality Date   CATARACT EXTRACTION W/PHACO Right 05/08/2019   Procedure: CATARACT EXTRACTION PHACO AND INTRAOCULAR LENS PLACEMENT ([place]);  Surgeon: Baruch Goldmann, MD;  Location: [facility];  Service: Ophthalmology;  Laterality: Right;  CDE: 10.02   COLONOSCOPY N/A 02/08/2016   Procedure: COLONOSCOPY;  Surgeon: [name], MD;  Location: [facility];  Service: Endoscopy;  Laterality: N/A;  1200 - moved to 11:45 - Ann to notify   CORONARY STENT INTERVENTION N/A 06/06/2018   Procedure: CORONARY STENT INTERVENTION;  Surgeon: [name], [name], MD;  Location: [place] [facility];  Service: Cardiovascular;  Laterality: N/A;   INTRAVASCULAR PRESSURE WIRE/FFR STUDY N/A 06/06/2018   Procedure: INTRAVASCULAR PRESSURE WIRE/FFR STUDY;  Surgeon: [name], [name], MD;  Location: [place] [facility];  Service: Cardiovascular;  Laterality: N/A;   LEFT HEART CATH AND CORONARY ANGIOGRAPHY N/A 04/24/2019   Procedure: LEFT HEART CATH AND CORONARY ANGIOGRAPHY;  Surgeon: Belva Crome, MD;  Location: [place] [facility];  Service: Cardiovascular;  Laterality: N/A;   LEFT HEART CATHETERIZATION WITH CORONARY ANGIOGRAM N/A 11/19/2014   Procedure: LEFT HEART CATHETERIZATION WITH CORONARY ANGIOGRAM;  Surgeon: [name], MD;  [facility]: [facility] C[facility] LAB;  Service: Cardiovascular;  Laterality: N/A;   RIGHT/LEFT HEART CATH AND CORONARY ANGIOGRAPHY N/A 06/06/2018   Procedure: RIGHT/LEFT HEART CATH AND CORONARY ANGIOGRAPHY;  Surgeon: [name], [name], MD;  Location: [place] [facility];  Service: Cardiovascular;  Laterality: N/A;      Social History:      Social History   Tobacco Use   Smoking status: Never Smoker   Smokeless tobacco: Never Used  Substance Use Topics   Alcohol use: No    Alcohol/week: 0.0 standard drinks       Family History :     Family History  Problem Relation Age of Onset   Dementia Mother    Heart disease Mother        heart attack      Hypertension Mother    Heart attack Father    Heart disease Father        heart attack    Hypertension Father        Home Medications:   Prior to Admission medications   Medication Sig Start Date End Date Taking? Authorizing Provider  acetaminophen (TYLENOL) 500 MG tablet Take 500 mg by mouth every 6 (six) hours as needed for headache.   Yes [provider]  cloNIDine (CATAPRES) 0.2 MG tablet Take one tablet by mouth at bedtime for blood pressure Patient taking differently: Take 0.2 mg by mouth at bedtime.  03/25/19  Yes [name], MD  clopidogrel (PLAVIX) 75 MG tablet Take 1 tablet (75 mg total) by mouth daily. 02/25/19  Yes Herminio Commons, MD  nitroGLYCERIN (NITROSTAT) 0.4 MG SL tablet Place 1 tablet (0.4 mg total) under the tongue every 5 (five) minutes as needed. Patient taking differently: Place 0.4 mg under the tongue every 5 (five) minutes x 3 doses as needed for chest pain.  06/06/18  Yes Reino Bellis B, NP  pantoprazole (PROTONIX) 40 MG tablet Take 1 tablet (40 mg total) by mouth daily. 02/25/19  Yes Herminio Commons, MD  rosuvastatin (CRESTOR) 40 MG tablet Take 1 tablet (40 mg total) by mouth daily. 03/25/19  Yes [name], MD  triamterene-hydrochlorothiazide (MAXZIDE-25) 37.5-25 MG tablet Take 1 tablet by mouth daily. 03/25/19  Yes [name], MD  Ascorbic Acid (VITAMIN C) 1000 MG tablet Take 1,000 mg by mouth daily.     [provider]  aspirin EC 81 MG tablet Take 81 mg by mouth at bedtime.    [provider]  busPIRone (BUSPAR) 5 MG tablet TAKE 1 TABLET TWICE A DAY (NEED FOLLOW UP APPOINTMENT WITH PCP) Patient taking differently: Take 5 mg by mouth 2 (two) times a day.  09/08/18   [name], MD  fish oil-omega-3 fatty acids 1000 MG capsule Take 1,000 mg by mouth daily.     [provider]  Polyethyl Glycol-Propyl Glycol (LUBRICANT EYE DROPS) 0.4-0.3 % SOLN Place 1 drop into both eyes 3  (three) times daily as needed (dry/irritated eyes.).    [provider]  temazepam (RESTORIL) 7.5 MG capsule Take 1 capsule (7.5 mg total) by mouth at bedtime as needed for sleep. 03/25/19   [name], MD  vitamin B-12 (CYANOCOBALAMIN) 1000 MCG tablet Take 1,000 mcg by mouth daily.    [provider]  vitamin E 1000 UNIT capsule Take 1 tablet by mouth daily.     [provider]     Allergies:    No Known Allergies   Physical Exam:   Vitals  Blood pressure (!) 156/68, pulse 62, temperature 98.9 F (37.2 C), temperature source Tympanic, resp. rate 18, height 5\' 2"  (1.575 m), weight 61.2 kg, SpO2 100 %.  1.  General: axoxo3  2. Psychiatric: euthymic  3. Neurologic: cn2-12 intact, reflexes 2+ symmetric, diffuse with no clonus, motor 5/5 in all 4 ext  4. HEENMT:  Anicteric, pupils 1.64mm symmetric, direct, consensual, near intact, no photophobia Neck: supple, no jvd  5. Respiratory : CTAB  6. Cardiovascular : rrr s1, s2, no m/g/r  7. Gastrointestial:  Abd: soft, nt, nd, +bs  8. Skin:  Ext: no c/c/e, no rash  9.Musculoskeletal:  Good ROM    Data Review:    CBC Recent Labs  Lab 06/15/19 1442  WBC 5.7  HGB 13.0  HCT 38.9  PLT 342  MCV 92.2  MCH 30.8  MCHC 33.4  RDW 13.1  LYMPHSABS 1.1  MONOABS 0.3  EOSABS 0.0  BASOSABS 0.1   ------------------------------------------------------------------------------------------------------------------  Results for orders placed or performed during the hospital encounter of 06/15/19 (from the past 48 hour(s))  Ethanol     Status: None   Collection Time: 06/15/19  2:42 PM  Result Value Ref Range   Alcohol, Ethyl (B) <10 <10 mg/dL    Comment: (NOTE) Lowest detectable limit for serum alcohol is 10 mg/dL. For medical purposes only. Performed at [facility], [address], [place], [place] [address]   Protime-INR     Status: None   Collection Time: 06/15/19  2:42 PM  Result Value  Ref Range   Prothrombin  Time 12.6 11.4 - 15.2 seconds   INR 1.0 0.8 - 1.2    Comment: (NOTE) INR goal varies based on device and disease states. Performed at [facility], [address], [place], [place] [address]   APTT     Status: None   Collection Time: 06/15/19  2:42 PM  Result Value Ref Range   aPTT 26 24 - 36 seconds    Comment: Performed at [facility], [address] [place], [place] [address]  CBC     Status: None   Collection Time: 06/15/19  2:42 PM  Result Value Ref Range   WBC 5.7 4.0 - 10.5 K/uL   RBC 4.22 3.87 - 5.11 MIL/uL   Hemoglobin 13.0 12.0 - 15.0 g/dL   HCT 38.9 36.0 - 46.0 %   MCV 92.2 80.0 - 100.0 fL   MCH 30.8 26.0 - 34.0 pg   MCHC 33.4 30.0 - 36.0 g/dL   RDW 13.1 11.5 - 15.5 %   Platelets 342 150 - 400 K/uL   nRBC 0.0 0.0 - 0.2 %    Comment: Performed at [facility], [address], [place], [place] [address]  Differential     Status: None   Collection Time: 06/15/19  2:42 PM  Result Value Ref Range   Neutrophils Relative % 73 %   Neutro Abs 4.2 1.7 - 7.7 K/uL   Lymphocytes Relative 19 %   Lymphs Abs 1.1 0.7 - 4.0 K/uL   Monocytes Relative 6 %   Monocytes Absolute 0.3 0.1 - 1.0 K/uL   Eosinophils Relative 1 %   Eosinophils Absolute 0.0 0.0 - 0.5 K/uL   Basophils Relative 1 %   Basophils Absolute 0.1 0.0 - 0.1 K/uL   Immature Granulocytes 0 %   Abs Immature Granulocytes 0.01 0.00 - 0.07 K/uL    Comment: Performed at [facility], [address] [place], [place] [address]  Comprehensive metabolic panel     Status: Abnormal   Collection Time: 06/15/19  2:42 PM  Result Value Ref Range   Sodium 137 135 - 145 mmol/L   Potassium 2.2 (LL) 3.5 - 5.1 mmol/L    Comment: CRITICAL RESULT CALLED TO, READ BACK BY AND VERIFIED WITH: MOORE,M AT 1605 ON 9.14.20 BY ISLEY,B    Chloride 99 98 - 111 mmol/L   CO2 26 22 - 32 mmol/L   Glucose, Bld 111 (H) 70 - 99 mg/dL   BUN 13 8 - 23 mg/dL   Creatinine, Ser 0.56 0.44 - 1.00 mg/dL   Calcium 9.2 8.9 - 10.3 mg/dL    Total Protein 7.8 6.5 - 8.1 g/dL   Albumin 4.5 3.5 - 5.0 g/dL   AST 28 15 - 41 U/L   ALT 21 0 - 44 U/L   Alkaline Phosphatase 54 38 - 126 U/L   Total Bilirubin 0.6 0.3 - 1.2 mg/dL   GFR calc non Af Amer >60 >60 mL/min   GFR calc Af Amer >60 >60 mL/min   Anion gap 12 5 - 15    Comment: Performed at [facility], [address], [place], [place] [address]  Magnesium     Status: None   Collection Time: 06/15/19  2:42 PM  Result Value Ref Range   Magnesium 2.2 1.7 - 2.4 mg/dL    Comment: Performed at [facility], [address] [place], [place] [address]  I-stat Creatinine, ED     Status: None   Collection Time: 06/15/19  2:53 PM  Result Value Ref  Range   Creatinine, Ser 0.60 0.44 - 1.00 mg/dL    Chemistries  Recent Labs  Lab 06/15/19 1442 06/15/19 1453  NA 137  --   K 2.2*  --   CL 99  --   CO2 26  --   GLUCOSE 111*  --   BUN 13  --   CREATININE 0.56 0.60  CALCIUM 9.2  --   MG 2.2  --   AST 28  --   ALT 21  --   ALKPHOS 54  --   BILITOT 0.6  --    ------------------------------------------------------------------------------------------------------------------  ------------------------------------------------------------------------------------------------------------------ GFR: Estimated Creatinine Clearance: 57.1 mL/min (by C-G formula based on SCr of 0.6 mg/dL). Liver Function Tests: Recent Labs  Lab 06/15/19 1442  AST 28  ALT 21  ALKPHOS 54  BILITOT 0.6  PROT 7.8  ALBUMIN 4.5   No results for input(s): LIPASE, AMYLASE in the last 168 hours. No results for input(s): AMMONIA in the last 168 hours. Coagulation Profile: Recent Labs  Lab 06/15/19 1442  INR 1.0   Cardiac Enzymes: No results for input(s): CKTOTAL, CKMB, CKMBINDEX, TROPONINI in the last 168 hours. BNP (last 3 results) No results for input(s): PROBNP in the last 8760 hours. HbA1C: No results for input(s): HGBA1C in the last 72 hours. CBG: No results for input(s): GLUCAP in the last  168 hours. Lipid Profile: No results for input(s): CHOL, HDL, LDLCALC, TRIG, CHOLHDL, LDLDIRECT in the last 72 hours. Thyroid Function Tests: No results for input(s): TSH, T4TOTAL, FREET4, T3FREE, THYROIDAB in the last 72 hours. Anemia Panel: No results for input(s): VITAMINB12, FOLATE, FERRITIN, TIBC, IRON, RETICCTPCT in the last 72 hours.  --------------------------------------------------------------------------------------------------------------- Urine analysis:    Component Value Date/Time   COLORURINE YELLOW 01/23/2018 [place] 01/23/2018 0941   LABSPEC 1.006 01/23/2018 0941   PHURINE 8.0 01/23/2018 0941   GLUCOSEU NEGATIVE 01/23/2018 0941   HGBUR NEGATIVE 01/23/2018 0941   HGBUR negative 09/09/2009 0841   BILIRUBINUR NEGATIVE 08/17/2015 0822   BILIRUBINUR negative 01/05/2013 1653   KETONESUR NEGATIVE 01/23/2018 0941   PROTEINUR NEGATIVE 01/23/2018 0941   UROBILINOGEN 0.2 01/05/2013 1653   UROBILINOGEN 0.2 09/09/2009 0841   NITRITE NEGATIVE 01/23/2018 0941   LEUKOCYTESUR TRACE (A) 01/23/2018 0941      Imaging Results:    Ct Angio Head W/cm &/or Wo Cm  Result Date: 06/15/2019 CLINICAL DATA:  Headache, acute, normal neuro exam.a EXAM: CT ANGIOGRAPHY HEAD AND NECK TECHNIQUE: Multidetector CT imaging of the head and neck was performed using the standard protocol during bolus administration of intravenous contrast. Multiplanar CT image reconstructions and MIPs were obtained to evaluate the vascular anatomy. Carotid stenosis measurements (when applicable) are obtained utilizing NASCET criteria, using the distal internal carotid diameter as the denominator. CONTRAST:  167mL OMNIPAQUE IOHEXOL 350 MG/ML SOLN COMPARISON:  Report for brain MRI 07/22/2001 (images unavailable) FINDINGS: CT HEAD FINDINGS Brain: There is no acute intracranial hemorrhage. No demarcated cortical infarction. No evidence of intracranial mass. No midline shift or extra-axial fluid collection.  Cerebral volume is normal for age. Partially empty sella turcica. Vascular: Reported separately Skull: No calvarial fracture. Nonspecific 6 mm well-circumscribed lytic lucency within the right frontal calvarium. Sinuses: Mild ethmoid sinus mucosal thickening. No significant mastoid effusion. Orbits: Imaged globes and orbits demonstrate no acute abnormality. Review of the MIP images confirms the above findings CTA NECK FINDINGS Aortic arch: Standard branching. Imaged portions of the aortic arch demonstrate no aneurysm or evidence of dissection. Mild scattered plaque at the  origins of the great vessels of the neck. Right carotid system: The common carotid artery is widely patent to the bifurcation. Calcified and noncalcified plaque at the bifurcation without significant narrowing of the proximal cervical ICA. Distal to the bifurcation, the cervical ICA is widely patent. Left carotid system: The common carotid artery is widely patent to the bifurcation. Calcified and noncalcified plaque at the bifurcation without significant narrowing of the proximal cervical ICA. Distal to the bifurcation, the cervical ICA is widely patent. Vertebral arteries: The bilateral vertebral arteries are patent within the neck to the level of the foramen magnum without significant stenosis. The left vertebral artery is slightly dominant. Skeleton: Reversal of the expected cervical lordosis. Cervical spondylosis. No acute bony abnormality. Other neck: Mild generalized prominence of the thyroid gland. 3 mm left thyroid lobe nodule not meeting consensus criteria for ultrasound follow-up. No cervical lymphadenopathy. Upper chest: No confluent consolidation within the imaged lung apices. Review of the MIP images confirms the above findings CTA HEAD FINDINGS Anterior circulation: Scattered calcified atherosclerotic plaque within the bilateral intracranial internal carotid artery siphons with regions of no more than mild luminal narrowing. The right  middle and anterior cerebral arteries are patent without significant proximal stenosis. The left middle and anterior cerebral arteries are patent without significant proximal stenosis. 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 A 1-2 mm vascular protrusion arising inferiorly from the paraclinoid right internal carotid artery is favored to reflect an infundibulum. Tiny aneurysm not excluded. Posterior circulation: The intracranial vertebral arteries are patent without significant stenosis, as is the basilar artery.The posterior cerebral arteries are patent bilaterally without significant proximal stenosis. Venous sinuses: Within the limitations of contrast timing, no evidence of thrombosis. Anatomic variants: None significant Review of the MIP images confirms the above findings IMPRESSION: CT head: No CT evidence of acute intracranial abnormality. Nonspecific 6 mm well-circumscribed lytic lucency within the right frontal calvarium. CTA head: No intracranial large vessel occlusion or proximal high-grade arterial stenosis. Atherosclerotic calcification of the carotid artery siphons with regions of no more than mild luminal narrowing. 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 A 1-2 mm vascular protrusion arising inferiorly from the paraclinoid right ICA is favored to reflect an infundibulum, with tiny aneurysm not excluded. CTA neck: Mild atherosclerotic plaque within the visualized aortic arch and proximal branch vessels. Calcified and noncalcified plaque also present at the carotid bifurcations bilaterally. The bilateral common carotid, internal carotid and vertebral arteries are patent within the neck without significant stenosis. Mild generalized prominence of the thyroid gland. Correlate clinically and with relevant laboratory values. 3 mm left thyroid lobe nodule not meeting consensus criteria for  ultrasound follow-up. Electronically Signed   By: Kellie Simmering   On: 06/15/2019 17:09   Ct Angio Neck W And/or Wo Contrast  Result Date: 06/15/2019 CLINICAL DATA:  Headache, acute, normal neuro exam.a EXAM: CT ANGIOGRAPHY HEAD AND NECK TECHNIQUE: Multidetector CT imaging of the head and neck was performed using the standard protocol during bolus administration of intravenous contrast. Multiplanar CT image reconstructions and MIPs were obtained to evaluate the vascular anatomy. Carotid stenosis measurements (when applicable) are obtained utilizing NASCET criteria, using the distal internal carotid diameter as the denominator. CONTRAST:  149mL OMNIPAQUE IOHEXOL 350 MG/ML SOLN COMPARISON:  Report for brain MRI 07/22/2001 (images unavailable) FINDINGS: CT HEAD FINDINGS Brain: There is no acute intracranial hemorrhage. No demarcated cortical infarction. No evidence of intracranial mass.  No midline shift or extra-axial fluid collection. Cerebral volume is normal for age. Partially empty sella turcica. Vascular: Reported separately Skull: No calvarial fracture. Nonspecific 6 mm well-circumscribed lytic lucency within the right frontal calvarium. Sinuses: Mild ethmoid sinus mucosal thickening. No significant mastoid effusion. Orbits: Imaged globes and orbits demonstrate no acute abnormality. Review of the MIP images confirms the above findings CTA NECK FINDINGS Aortic arch: Standard branching. Imaged portions of the aortic arch demonstrate no aneurysm or evidence of dissection. Mild scattered plaque at the origins of the great vessels of the neck. Right carotid system: The common carotid artery is widely patent to the bifurcation. Calcified and noncalcified plaque at the bifurcation without significant narrowing of the proximal cervical ICA. Distal to the bifurcation, the cervical ICA is widely patent. Left carotid system: The common carotid artery is widely patent to the bifurcation. Calcified and noncalcified plaque  at the bifurcation without significant narrowing of the proximal cervical ICA. Distal to the bifurcation, the cervical ICA is widely patent. Vertebral arteries: The bilateral vertebral arteries are patent within the neck to the level of the foramen magnum without significant stenosis. The left vertebral artery is slightly dominant. Skeleton: Reversal of the expected cervical lordosis. Cervical spondylosis. No acute bony abnormality. Other neck: Mild generalized prominence of the thyroid gland. 3 mm left thyroid lobe nodule not meeting consensus criteria for ultrasound follow-up. No cervical lymphadenopathy. Upper chest: No confluent consolidation within the imaged lung apices. Review of the MIP images confirms the above findings CTA HEAD FINDINGS Anterior circulation: Scattered calcified atherosclerotic plaque within the bilateral intracranial internal carotid artery siphons with regions of no more than mild luminal narrowing. The right middle and anterior cerebral arteries are patent without significant proximal stenosis. The left middle and anterior cerebral arteries are patent without significant proximal stenosis. 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 A 1-2 mm vascular protrusion arising inferiorly from the paraclinoid right internal carotid artery is favored to reflect an infundibulum. Tiny aneurysm not excluded. Posterior circulation: The intracranial vertebral arteries are patent without significant stenosis, as is the basilar artery.The posterior cerebral arteries are patent bilaterally without significant proximal stenosis. Venous sinuses: Within the limitations of contrast timing, no evidence of thrombosis. Anatomic variants: None significant Review of the MIP images confirms the above findings IMPRESSION: CT head: No CT evidence of acute intracranial abnormality. Nonspecific 6 mm well-circumscribed lytic lucency  within the right frontal calvarium. CTA head: No intracranial large vessel occlusion or proximal high-grade arterial stenosis. Atherosclerotic calcification of the carotid artery siphons with regions of no more than mild luminal narrowing. 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 A 1-2 mm vascular protrusion arising inferiorly from the paraclinoid right ICA is favored to reflect an infundibulum, with tiny aneurysm not excluded. CTA neck: Mild atherosclerotic plaque within the visualized aortic arch and proximal branch vessels. Calcified and noncalcified plaque also present at the carotid bifurcations bilaterally. The bilateral common carotid, internal carotid and vertebral arteries are patent within the neck without significant stenosis. Mild generalized prominence of the thyroid gland. Correlate clinically and with relevant laboratory values. 3 mm left thyroid lobe nodule not meeting consensus criteria for ultrasound follow-up. 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 Partially empty sella tursica  CAD s/p DES to prox/mid LAD 04/24/2019 Cont aspirin Cont Plavix 75mg  po qday Cont Crestor 40mg  po qday  Hypertension Cont Maxide 25/37.5mg  po qday Cont Clonidine 0.2mg  po qhs Check cmp in am  Gerd Cont PPI  Anxiety/ depression/ insomnia Cont Buspar 5mg  po bid Cont Temazepam 7.5mg  po qhs     DVT Prophylaxis-   Lovenox - SCDs     AM Labs Ordered, also please review Full Orders  Family Communication: Admission, patients condition and plan of care including tests being ordered have been discussed with the patient  who indicate understanding and agree with the plan and Code Status.  Code Status:  FULL CODE per patient, husband present in ED  Admission status: Observation: Based on patients clinical presentation and evaluation of above clinical data, I have made determination that patient meets observation criteria at this time.  Time spent in minutes : 70   Jani Gravel M.D on 06/15/2019 at 7:53 PM

## 2019-06-16 ENCOUNTER — Observation Stay (HOSPITAL_COMMUNITY): Payer: Medicare Other

## 2019-06-16 DIAGNOSIS — E876 Hypokalemia: Secondary | ICD-10-CM | POA: Diagnosis not present

## 2019-06-16 DIAGNOSIS — R51 Headache: Secondary | ICD-10-CM | POA: Diagnosis not present

## 2019-06-16 DIAGNOSIS — I10 Essential (primary) hypertension: Secondary | ICD-10-CM | POA: Diagnosis not present

## 2019-06-16 LAB — COMPREHENSIVE METABOLIC PANEL
ALT: 19 U/L (ref 0–44)
AST: 22 U/L (ref 15–41)
Albumin: 3.8 g/dL (ref 3.5–5.0)
Alkaline Phosphatase: 44 U/L (ref 38–126)
Anion gap: 11 (ref 5–15)
BUN: 12 mg/dL (ref 8–23)
CO2: 24 mmol/L (ref 22–32)
Calcium: 9 mg/dL (ref 8.9–10.3)
Chloride: 106 mmol/L (ref 98–111)
Creatinine, Ser: 0.67 mg/dL (ref 0.44–1.00)
GFR calc Af Amer: >60 mL/min (ref >60)
GFR calc non Af Amer: >60 mL/min (ref >60)
Glucose, Bld: 97 mg/dL (ref 70–99)
Potassium: 3.3 mmol/L — ABNORMAL LOW (ref 3.5–5.1)
Sodium: 141 mmol/L (ref 135–145)
Total Bilirubin: 0.7 mg/dL (ref 0.3–1.2)
Total Protein: 6.8 g/dL (ref 6.5–8.1)

## 2019-06-16 LAB — CBC
HCT: 36.1 % (ref 36.0–46.0)
Hemoglobin: 12 g/dL (ref 12.0–15.0)
MCH: 31.3 pg (ref 26.0–34.0)
MCHC: 33.2 g/dL (ref 30.0–36.0)
MCV: 94 fL (ref 80.0–100.0)
Platelets: 321 10*3/uL (ref 150–400)
RBC: 3.84 MIL/uL — ABNORMAL LOW (ref 3.87–5.11)
RDW: 13.4 % (ref 11.5–15.5)
WBC: 4.8 10*3/uL (ref 4.0–10.5)
nRBC: 0 % (ref 0.0–0.2)

## 2019-06-16 LAB — SARS CORONAVIRUS 2 (TAT 6-24 HRS): SARS Coronavirus 2: NEGATIVE

## 2019-06-16 MED ORDER — KETOROLAC TROMETHAMINE 30 MG/ML IJ SOLN
30.0000 mg | Freq: Once | INTRAMUSCULAR | Status: AC
  Administered 2019-06-16: 30 mg via INTRAVENOUS
  Filled 2019-06-16: qty 1

## 2019-06-16 MED ORDER — ACETAMINOPHEN 325 MG PO TABS: 650.0000 mg | ORAL_TABLET | Freq: Four times a day (QID) | ORAL | Status: DC | PRN

## 2019-06-16 MED ORDER — POTASSIUM CHLORIDE CRYS ER 20 MEQ PO TBCR
40.0000 meq | EXTENDED_RELEASE_TABLET | Freq: Once | ORAL | Status: AC
  Administered 2019-06-16: 40 meq via ORAL
  Filled 2019-06-16: qty 2

## 2019-06-16 MED ORDER — VITAMIN E 180 MG (400 UNIT) PO CAPS
800.0000 [IU] | ORAL_CAPSULE | Freq: Every day | ORAL | Status: DC
  Administered 2019-06-16: 10:00:00 800 [IU] via ORAL
  Filled 2019-06-16: qty 2

## 2019-06-16 MED ORDER — POTASSIUM CHLORIDE IN NACL 40-0.9 MEQ/L-% IV SOLN
INTRAVENOUS | Status: DC
  Administered 2019-06-16: 125 mL/h via INTRAVENOUS

## 2019-06-16 MED ORDER — ONDANSETRON HCL 4 MG/2ML IJ SOLN: 4.0000 mg | Freq: Four times a day (QID) | INTRAMUSCULAR | Status: DC | PRN

## 2019-06-16 MED ORDER — GADOBUTROL 1 MMOL/ML IV SOLN
7.0000 mL | Freq: Once | INTRAVENOUS | Status: AC | PRN
  Administered 2019-06-16: 15:00:00 7 mL via INTRAVENOUS

## 2019-06-16 NOTE — Progress Notes (Signed)
Nsg Discharge Note  Admit Date:  06/15/2019 Discharge date: 06/16/2019   Tanya [name] to be D/C'd Home per MD order.  AVS completed.  Copy for chart, and copy for patient signed, and dated. Patient/caregiver able to verbalize understanding.  Discharge Medication: Allergies as of 06/16/2019   No Known Allergies     Medication List    TAKE these medications   acetaminophen 500 MG tablet Commonly known as: TYLENOL Take 500 mg by mouth every 6 (six) hours as needed for headache.   aspirin EC 81 MG tablet Take 81 mg by mouth at bedtime.   busPIRone 5 MG tablet Commonly known as: BUSPAR TAKE 1 TABLET TWICE A DAY (NEED FOLLOW UP APPOINTMENT WITH PCP) What changed: See the new instructions.   cloNIDine 0.2 MG tablet Commonly known as: CATAPRES Take one tablet by mouth at bedtime for blood pressure What changed:   how much to take  how to take this  when to take this  additional instructions   clopidogrel 75 MG tablet Commonly known as: PLAVIX Take 1 tablet (75 mg total) by mouth daily.   fish oil-omega-3 fatty acids 1000 MG capsule Take 1,000 mg by mouth daily.   Lubricant Eye Drops 0.4-0.3 % Soln Generic drug: Polyethyl Glycol-Propyl Glycol Place 1 drop into both eyes 3 (three) times daily as needed (dry/irritated eyes.).   nitroGLYCERIN 0.4 MG SL tablet Commonly known as: Nitrostat Place 1 tablet (0.4 mg total) under the tongue every 5 (five) minutes as needed. What changed:   when to take this  reasons to take this   pantoprazole 40 MG tablet Commonly known as: PROTONIX Take 1 tablet (40 mg total) by mouth daily.   rosuvastatin 40 MG tablet Commonly known as: Crestor Take 1 tablet (40 mg total) by mouth daily.   temazepam 7.5 MG capsule Commonly known as: Restoril Take 1 capsule (7.5 mg total) by mouth at bedtime as needed for sleep.   triamterene-hydrochlorothiazide 37.5-25 MG tablet Commonly known as: MAXZIDE-25 Take 1 tablet by mouth daily.    vitamin B-12 1000 MCG tablet Commonly known as: CYANOCOBALAMIN Take 1,000 mcg by mouth daily.   vitamin C 1000 MG tablet Take 1,000 mg by mouth daily.   vitamin E 1000 UNIT capsule Take 1 tablet by mouth daily.       Discharge Assessment: Vitals:   06/16/19 1230 06/16/19 1630  BP: (!) 116/59 (!) 133/52  Pulse: (!) 48 (!) 52  Resp: 18 16  Temp: 98.5 F (36.9 C) 98.3 F (36.8 C)  SpO2: 100% 100%   Skin clean, dry and intact without evidence of skin break down, no evidence of skin tears noted. IV catheter discontinued intact. Site without signs and symptoms of complications - no redness or edema noted at insertion site, patient denies c/o pain - only slight tenderness at site.  Dressing with slight pressure applied.  D/c Instructions-Education: Discharge instructions given to patient/family with verbalized understanding. D/c education completed with patient/family including follow up instructions, medication list, d/c activities limitations if indicated, with other d/c instructions as indicated by MD - patient able to verbalize understanding, all questions fully answered. Patient instructed to return to ED, call 911, or call MD for any changes in condition.  Patient escorted via [place], and D/C home via private auto.  Loa Socks, RN 06/16/2019 5:18 PM

## 2019-06-16 NOTE — Discharge Summary (Signed)
Physician Discharge Summary  Tanya [name] [id number] DOB: 11-19-1950 DOA: 06/15/2019  PCP: [name], MD  Admit date: 06/15/2019 Discharge date: 06/16/2019  Admitted From: Home Disposition:  Home   Recommendations for Outpatient Follow-up:  1. Follow up with PCP in 1-2 weeks 2. Please obtain BMP/CBC in one week     Discharge Condition: Stable CODE STATUS: FULL Diet recommendation: Heart Healthy    Brief/Interim Summary: 68 year old female with a history of hypertension, hyperlipidemia, hypothyroidism, coronary artery disease status post LAD stent, depression, anxiety presenting with 1 day history of frontal headache beginning in the a.m. of 06/15/2019.  She stated this headache was much more severe than her usual previous headaches.  This was accompanied by multiple episodes of nausea and vomiting and dizziness.  As result, the patient presented for further evaluations.  She denied any fevers, chills, visual loss, chest pain, coughing, hemoptysis, abdominal pain, diarrhea, hematochezia, melena, dysuria, hematuria.  The patient states that she usually gets approximately 3 headaches per month for which she usually takes acetaminophen with some relief.  However the headache on 06/15/2019 was much worse than usual.  She did not have any focal extremity weakness.  There was no loss of consciousness.  The patient states that she has been recently started on a nighttime  medicine by her primary care provider, but she does not recall the name of the medication.  She denies any other over-the-counter medications besides acetaminophen.  She denies any alcohol, drug use, or tobacco.  She endorses compliance with all her medications. Because of the patient's severe headache, vomiting, and hypokalemia, the patient was admitted for further evaluation.  In the emergency department, BMP showed a potassium 2.2.  LFTs and CBC were unremarkable.  Urine drug screen was positive for opiates and  benzodiazepines.  Urinalysis was negative for pyuria.  EKG showed sinus rhythm with nonspecific T wave changes.  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  There was no other concerning findings on the CTA.  Discharge Diagnoses:  Hypokalemia -Secondary to GI loss from the patient's emesis -The patient is also on chronic Maxide which may be partly contributing -Repleted appropriately -Magnesium 2.2   Intractable headache -Suspect migraine -MRI brain--Normal appearance of the brain parenchyma for age. 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  No other concerning findings -Give Toradol x2 with improvement -no focal neurologic deficits  Sinus bradycardia -Baseline heart rate 50s -Heart rate in the low 40s and nighttime, but patient asymptomatic -Personally reviewed telemetry--no AV blocks, no pauses -Outpatient follow-up with cardiology -May need to consider discontinuation of clonidine  Coronary artery disease -No chest pain presently -Personally reviewed EKG--sinus bradycardia, nonspecific T wave change -04/24/2019--heart catheterization--40% eccentric in-stent stenosis mid LAD; no other high-grade obstruction in the LAD stent -Continue aspirin and Plavix -Continue statin  Essential hypertension -Continue Maxide and clonidine  Hyperlipidemia -Continue statin  Depression/anxiety -Continue BuSpar   Discharge Instructions   Allergies as of 06/16/2019   No Known Allergies     Medication List    TAKE these medications   acetaminophen 500 MG tablet Commonly known as: TYLENOL Take 500 mg by mouth every 6 (six) hours as needed for headache.   aspirin EC 81 MG tablet Take 81 mg by mouth at bedtime.   busPIRone 5 MG tablet Commonly known as: BUSPAR TAKE 1 TABLET  TWICE A DAY  (NEED FOLLOW UP APPOINTMENT WITH PCP) What changed: See the new instructions.   cloNIDine 0.2 MG tablet Commonly known as: CATAPRES Take one tablet by mouth at bedtime for blood pressure What changed:   how much to take  how to take this  when to take this  additional instructions   clopidogrel 75 MG tablet Commonly known as: PLAVIX Take 1 tablet (75 mg total) by mouth daily.   fish oil-omega-3 fatty acids 1000 MG capsule Take 1,000 mg by mouth daily.   Lubricant Eye Drops 0.4-0.3 % Soln Generic drug: Polyethyl Glycol-Propyl Glycol Place 1 drop into both eyes 3 (three) times daily as needed (dry/irritated eyes.).   nitroGLYCERIN 0.4 MG SL tablet Commonly known as: Nitrostat Place 1 tablet (0.4 mg total) under the tongue every 5 (five) minutes as needed. What changed:   when to take this  reasons to take this   pantoprazole 40 MG tablet Commonly known as: PROTONIX Take 1 tablet (40 mg total) by mouth daily.   rosuvastatin 40 MG tablet Commonly known as: Crestor Take 1 tablet (40 mg total) by mouth daily.   temazepam 7.5 MG capsule Commonly known as: Restoril Take 1 capsule (7.5 mg total) by mouth at bedtime as needed for sleep.   triamterene-hydrochlorothiazide 37.5-25 MG tablet Commonly known as: MAXZIDE-25 Take 1 tablet by mouth daily.   vitamin B-12 1000 MCG tablet Commonly known as: CYANOCOBALAMIN Take 1,000 mcg by mouth daily.   vitamin C 1000 MG tablet Take 1,000 mg by mouth daily.   vitamin E 1000 UNIT capsule Take 1 tablet by mouth daily.       No Known Allergies  Consultations:  none   Procedures/Studies: Ct Angio Head W/cm &/or Wo Cm  Result Date: 06/15/2019 CLINICAL DATA:  Headache, acute, normal neuro exam.a EXAM: CT ANGIOGRAPHY HEAD AND NECK TECHNIQUE: Multidetector CT imaging of the head and neck was performed using the standard protocol during bolus administration of intravenous contrast. Multiplanar CT image reconstructions and  MIPs were obtained to evaluate the vascular anatomy. Carotid stenosis measurements (when applicable) are obtained utilizing NASCET criteria, using the distal internal carotid diameter as the denominator. CONTRAST:  139mL OMNIPAQUE IOHEXOL 350 MG/ML SOLN COMPARISON:  Report for brain MRI 07/22/2001 (images unavailable) FINDINGS: CT HEAD FINDINGS Brain: There is no acute intracranial hemorrhage. No demarcated cortical infarction. No evidence of intracranial mass. No midline shift or extra-axial fluid collection. Cerebral volume is normal for age. Partially empty sella turcica. Vascular: Reported separately Skull: No calvarial fracture. Nonspecific 6 mm well-circumscribed lytic lucency within the right frontal calvarium. Sinuses: Mild ethmoid sinus mucosal thickening. No significant mastoid effusion. Orbits: Imaged globes and orbits demonstrate no acute abnormality. Review of the MIP images confirms the above findings CTA NECK FINDINGS Aortic arch: Standard branching. Imaged portions of the aortic arch demonstrate no aneurysm or evidence of dissection. Mild scattered plaque at the origins of the great vessels of the neck. Right carotid system: The common carotid artery is widely patent to the bifurcation. Calcified and noncalcified plaque at the bifurcation without significant narrowing of the proximal cervical ICA. Distal to the bifurcation, the cervical ICA is widely patent. Left carotid system: The common carotid artery is widely patent to the bifurcation. Calcified and noncalcified plaque at the bifurcation without significant narrowing of the proximal cervical ICA. Distal to the bifurcation, the cervical ICA is widely patent. Vertebral arteries: The bilateral vertebral arteries are patent within the neck to the level of the foramen magnum  without significant stenosis. The left vertebral artery is slightly dominant. Skeleton: Reversal of the expected cervical lordosis. Cervical spondylosis. No acute bony  abnormality. Other neck: Mild generalized prominence of the thyroid gland. 3 mm left thyroid lobe nodule not meeting consensus criteria for ultrasound follow-up. No cervical lymphadenopathy. Upper chest: No confluent consolidation within the imaged lung apices. Review of the MIP images confirms the above findings CTA HEAD FINDINGS Anterior circulation: Scattered calcified atherosclerotic plaque within the bilateral intracranial internal carotid artery siphons with regions of no more than mild luminal narrowing. The right middle and anterior cerebral arteries are patent without significant proximal stenosis. The left middle and anterior cerebral arteries are patent without significant proximal stenosis. 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 A 1-2 mm vascular protrusion arising inferiorly from the paraclinoid right internal carotid artery is favored to reflect an infundibulum. Tiny aneurysm not excluded. Posterior circulation: The intracranial vertebral arteries are patent without significant stenosis, as is the basilar artery.The posterior cerebral arteries are patent bilaterally without significant proximal stenosis. Venous sinuses: Within the limitations of contrast timing, no evidence of thrombosis. Anatomic variants: None significant Review of the MIP images confirms the above findings IMPRESSION: CT head: No CT evidence of acute intracranial abnormality. Nonspecific 6 mm well-circumscribed lytic lucency within the right frontal calvarium. CTA head: No intracranial large vessel occlusion or proximal high-grade arterial stenosis. Atherosclerotic calcification of the carotid artery siphons with regions of no more than mild luminal narrowing. 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 A 1-2 mm vascular protrusion arising inferiorly from the paraclinoid right ICA is favored to reflect an  infundibulum, with tiny aneurysm not excluded. CTA neck: Mild atherosclerotic plaque within the visualized aortic arch and proximal branch vessels. Calcified and noncalcified plaque also present at the carotid bifurcations bilaterally. The bilateral common carotid, internal carotid and vertebral arteries are patent within the neck without significant stenosis. Mild generalized prominence of the thyroid gland. Correlate clinically and with relevant laboratory values. 3 mm left thyroid lobe nodule not meeting consensus criteria for ultrasound follow-up. Electronically Signed   By: Kellie Simmering   On: 06/15/2019 17:09   Ct Angio Neck W And/or Wo Contrast  Result Date: 06/15/2019 CLINICAL DATA:  Headache, acute, normal neuro exam.a EXAM: CT ANGIOGRAPHY HEAD AND NECK TECHNIQUE: Multidetector CT imaging of the head and neck was performed using the standard protocol during bolus administration of intravenous contrast. Multiplanar CT image reconstructions and MIPs were obtained to evaluate the vascular anatomy. Carotid stenosis measurements (when applicable) are obtained utilizing NASCET criteria, using the distal internal carotid diameter as the denominator. CONTRAST:  176mL OMNIPAQUE IOHEXOL 350 MG/ML SOLN COMPARISON:  Report for brain MRI 07/22/2001 (images unavailable) FINDINGS: CT HEAD FINDINGS Brain: There is no acute intracranial hemorrhage. No demarcated cortical infarction. No evidence of intracranial mass. No midline shift or extra-axial fluid collection. Cerebral volume is normal for age. Partially empty sella turcica. Vascular: Reported separately Skull: No calvarial fracture. Nonspecific 6 mm well-circumscribed lytic lucency within the right frontal calvarium. Sinuses: Mild ethmoid sinus mucosal thickening. No significant mastoid effusion. Orbits: Imaged globes and orbits demonstrate no acute abnormality. Review of the MIP images confirms the above findings CTA NECK FINDINGS Aortic arch: Standard branching.  Imaged portions of the aortic arch demonstrate no aneurysm or evidence of dissection. Mild scattered plaque at the origins of the great vessels of the neck. Right carotid system: The common  carotid artery is widely patent to the bifurcation. Calcified and noncalcified plaque at the bifurcation without significant narrowing of the proximal cervical ICA. Distal to the bifurcation, the cervical ICA is widely patent. Left carotid system: The common carotid artery is widely patent to the bifurcation. Calcified and noncalcified plaque at the bifurcation without significant narrowing of the proximal cervical ICA. Distal to the bifurcation, the cervical ICA is widely patent. Vertebral arteries: The bilateral vertebral arteries are patent within the neck to the level of the foramen magnum without significant stenosis. The left vertebral artery is slightly dominant. Skeleton: Reversal of the expected cervical lordosis. Cervical spondylosis. No acute bony abnormality. Other neck: Mild generalized prominence of the thyroid gland. 3 mm left thyroid lobe nodule not meeting consensus criteria for ultrasound follow-up. No cervical lymphadenopathy. Upper chest: No confluent consolidation within the imaged lung apices. Review of the MIP images confirms the above findings CTA HEAD FINDINGS Anterior circulation: Scattered calcified atherosclerotic plaque within the bilateral intracranial internal carotid artery siphons with regions of no more than mild luminal narrowing. The right middle and anterior cerebral arteries are patent without significant proximal stenosis. The left middle and anterior cerebral arteries are patent without significant proximal stenosis. 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 A 1-2 mm vascular protrusion arising inferiorly from the paraclinoid right internal carotid artery is favored to reflect an infundibulum. Tiny  aneurysm not excluded. Posterior circulation: The intracranial vertebral arteries are patent without significant stenosis, as is the basilar artery.The posterior cerebral arteries are patent bilaterally without significant proximal stenosis. Venous sinuses: Within the limitations of contrast timing, no evidence of thrombosis. Anatomic variants: None significant Review of the MIP images confirms the above findings IMPRESSION: CT head: No CT evidence of acute intracranial abnormality. Nonspecific 6 mm well-circumscribed lytic lucency within the right frontal calvarium. CTA head: No intracranial large vessel occlusion or proximal high-grade arterial stenosis. Atherosclerotic calcification of the carotid artery siphons with regions of no more than mild luminal narrowing. 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 A 1-2 mm vascular protrusion arising inferiorly from the paraclinoid right ICA is favored to reflect an infundibulum, with tiny aneurysm not excluded. CTA neck: Mild atherosclerotic plaque within the visualized aortic arch and proximal branch vessels. Calcified and noncalcified plaque also present at the carotid bifurcations bilaterally. The bilateral common carotid, internal carotid and vertebral arteries are patent within the neck without significant stenosis. Mild generalized prominence of the thyroid gland. Correlate clinically and with relevant laboratory values. 3 mm left thyroid lobe nodule not meeting consensus criteria for ultrasound follow-up. Electronically Signed   By: Kellie Simmering   On: 06/15/2019 17:09   Mr Brain W Wo Contrast  Result Date: 06/16/2019 CLINICAL DATA:  Headache. Normal neurological exam. Altered mental status the last 2 days. EXAM: MRI HEAD WITHOUT AND WITH CONTRAST TECHNIQUE: Multiplanar, multiecho pulse sequences of the brain and surrounding structures were obtained without and with intravenous contrast. CONTRAST:  8mL GADAVIST GADOBUTROL 1  MMOL/ML IV SOLN COMPARISON:  CT studies done yesterday. FINDINGS: Brain: The brain has a normal appearance without evidence of malformation, atrophy, old or acute small or large vessel infarction, mass lesion, hemorrhage, hydrocephalus or extra-axial collection. After contrast administration no significant abnormal enhancement occurs. Incidental venous angioma the left superior frontal lobe. Vascular: Major vessels at the base of the brain show flow. Venous sinuses appear patent. Skull and upper cervical spine: Normal.  Sinuses/Orbits: Clear/normal. Other: None significant. IMPRESSION: No abnormality seen to explain the clinical presentation. Normal appearance of the brain parenchyma for age. 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 Electronically Signed   By: Nelson Chimes M.D.   On: 06/16/2019 15:12        Discharge Exam: Vitals:   06/16/19 0831 06/16/19 1230  BP: (!) 120/51 (!) 116/59  Pulse: (!) 53 (!) 48  Resp: 18 18  Temp: 98.5 F (36.9 C) 98.5 F (36.9 C)  SpO2: 99% 100%   Vitals:   06/16/19 0426 06/16/19 0630 06/16/19 0831 06/16/19 1230  BP: 128/61 (!) 108/56 (!) 120/51 (!) 116/59  Pulse: (!) 53 (!) 48 (!) 53 (!) 48  Resp: 16 16 18 18   Temp: 98.5 F (36.9 C) 98.1 F (36.7 C) 98.5 F (36.9 C) 98.5 F (36.9 C)  TempSrc: Oral Oral Oral Oral  SpO2: 100% 100% 99% 100%  Weight:      Height:        General: Pt is alert, awake, not in acute distress Cardiovascular: RRR, S1/S2 +, no rubs, no gallops Respiratory: CTA bilaterally, no wheezing, no rhonchi Abdominal: Soft, NT, ND, bowel sounds + Extremities: no edema, no cyanosis Neuro:  CN II-XII intact, strength 4/5 in RUE, RLE, strength 4/5 LUE, LLE; sensation intact bilateral; no dysmetria; babinski equivocal    The results of significant diagnostics from this hospitalization (including imaging, microbiology, ancillary and laboratory) are listed below for reference.    Significant Diagnostic Studies: Ct  Angio Head W/cm &/or Wo Cm  Result Date: 06/15/2019 CLINICAL DATA:  Headache, acute, normal neuro exam.a EXAM: CT ANGIOGRAPHY HEAD AND NECK TECHNIQUE: Multidetector CT imaging of the head and neck was performed using the standard protocol during bolus administration of intravenous contrast. Multiplanar CT image reconstructions and MIPs were obtained to evaluate the vascular anatomy. Carotid stenosis measurements (when applicable) are obtained utilizing NASCET criteria, using the distal internal carotid diameter as the denominator. CONTRAST:  18mL OMNIPAQUE IOHEXOL 350 MG/ML SOLN COMPARISON:  Report for brain MRI 07/22/2001 (images unavailable) FINDINGS: CT HEAD FINDINGS Brain: There is no acute intracranial hemorrhage. No demarcated cortical infarction. No evidence of intracranial mass. No midline shift or extra-axial fluid collection. Cerebral volume is normal for age. Partially empty sella turcica. Vascular: Reported separately Skull: No calvarial fracture. Nonspecific 6 mm well-circumscribed lytic lucency within the right frontal calvarium. Sinuses: Mild ethmoid sinus mucosal thickening. No significant mastoid effusion. Orbits: Imaged globes and orbits demonstrate no acute abnormality. Review of the MIP images confirms the above findings CTA NECK FINDINGS Aortic arch: Standard branching. Imaged portions of the aortic arch demonstrate no aneurysm or evidence of dissection. Mild scattered plaque at the origins of the great vessels of the neck. Right carotid system: The common carotid artery is widely patent to the bifurcation. Calcified and noncalcified plaque at the bifurcation without significant narrowing of the proximal cervical ICA. Distal to the bifurcation, the cervical ICA is widely patent. Left carotid system: The common carotid artery is widely patent to the bifurcation. Calcified and noncalcified plaque at the bifurcation without significant narrowing of the proximal cervical ICA. Distal to the  bifurcation, the cervical ICA is widely patent. Vertebral arteries: The bilateral vertebral arteries are patent within the neck to the level of the foramen magnum without significant stenosis. The left vertebral artery is slightly dominant. Skeleton: Reversal of the expected cervical lordosis. Cervical spondylosis. No acute bony abnormality. Other neck: Mild generalized prominence of the thyroid gland. 3 mm left thyroid  lobe nodule not meeting consensus criteria for ultrasound follow-up. No cervical lymphadenopathy. Upper chest: No confluent consolidation within the imaged lung apices. Review of the MIP images confirms the above findings CTA HEAD FINDINGS Anterior circulation: Scattered calcified atherosclerotic plaque within the bilateral intracranial internal carotid artery siphons with regions of no more than mild luminal narrowing. The right middle and anterior cerebral arteries are patent without significant proximal stenosis. The left middle and anterior cerebral arteries are patent without significant proximal stenosis. 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 A 1-2 mm vascular protrusion arising inferiorly from the paraclinoid right internal carotid artery is favored to reflect an infundibulum. Tiny aneurysm not excluded. Posterior circulation: The intracranial vertebral arteries are patent without significant stenosis, as is the basilar artery.The posterior cerebral arteries are patent bilaterally without significant proximal stenosis. Venous sinuses: Within the limitations of contrast timing, no evidence of thrombosis. Anatomic variants: None significant Review of the MIP images confirms the above findings IMPRESSION: CT head: No CT evidence of acute intracranial abnormality. Nonspecific 6 mm well-circumscribed lytic lucency within the right frontal calvarium. CTA head: No intracranial large vessel occlusion or proximal  high-grade arterial stenosis. Atherosclerotic calcification of the carotid artery siphons with regions of no more than mild luminal narrowing. 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 A 1-2 mm vascular protrusion arising inferiorly from the paraclinoid right ICA is favored to reflect an infundibulum, with tiny aneurysm not excluded. CTA neck: Mild atherosclerotic plaque within the visualized aortic arch and proximal branch vessels. Calcified and noncalcified plaque also present at the carotid bifurcations bilaterally. The bilateral common carotid, internal carotid and vertebral arteries are patent within the neck without significant stenosis. Mild generalized prominence of the thyroid gland. Correlate clinically and with relevant laboratory values. 3 mm left thyroid lobe nodule not meeting consensus criteria for ultrasound follow-up. Electronically Signed   By: Kellie Simmering   On: 06/15/2019 17:09   Ct Angio Neck W And/or Wo Contrast  Result Date: 06/15/2019 CLINICAL DATA:  Headache, acute, normal neuro exam.a EXAM: CT ANGIOGRAPHY HEAD AND NECK TECHNIQUE: Multidetector CT imaging of the head and neck was performed using the standard protocol during bolus administration of intravenous contrast. Multiplanar CT image reconstructions and MIPs were obtained to evaluate the vascular anatomy. Carotid stenosis measurements (when applicable) are obtained utilizing NASCET criteria, using the distal internal carotid diameter as the denominator. CONTRAST:  169mL OMNIPAQUE IOHEXOL 350 MG/ML SOLN COMPARISON:  Report for brain MRI 07/22/2001 (images unavailable) FINDINGS: CT HEAD FINDINGS Brain: There is no acute intracranial hemorrhage. No demarcated cortical infarction. No evidence of intracranial mass. No midline shift or extra-axial fluid collection. Cerebral volume is normal for age. Partially empty sella turcica. Vascular: Reported separately Skull: No calvarial fracture. Nonspecific 6 mm  well-circumscribed lytic lucency within the right frontal calvarium. Sinuses: Mild ethmoid sinus mucosal thickening. No significant mastoid effusion. Orbits: Imaged globes and orbits demonstrate no acute abnormality. Review of the MIP images confirms the above findings CTA NECK FINDINGS Aortic arch: Standard branching. Imaged portions of the aortic arch demonstrate no aneurysm or evidence of dissection. Mild scattered plaque at the origins of the great vessels of the neck. Right carotid system: The common carotid artery is widely patent to the bifurcation. Calcified and noncalcified plaque at the bifurcation without significant narrowing of the proximal cervical ICA. Distal to the bifurcation, the cervical ICA is widely patent. Left carotid system: The  common carotid artery is widely patent to the bifurcation. Calcified and noncalcified plaque at the bifurcation without significant narrowing of the proximal cervical ICA. Distal to the bifurcation, the cervical ICA is widely patent. Vertebral arteries: The bilateral vertebral arteries are patent within the neck to the level of the foramen magnum without significant stenosis. The left vertebral artery is slightly dominant. Skeleton: Reversal of the expected cervical lordosis. Cervical spondylosis. No acute bony abnormality. Other neck: Mild generalized prominence of the thyroid gland. 3 mm left thyroid lobe nodule not meeting consensus criteria for ultrasound follow-up. No cervical lymphadenopathy. Upper chest: No confluent consolidation within the imaged lung apices. Review of the MIP images confirms the above findings CTA HEAD FINDINGS Anterior circulation: Scattered calcified atherosclerotic plaque within the bilateral intracranial internal carotid artery siphons with regions of no more than mild luminal narrowing. The right middle and anterior cerebral arteries are patent without significant proximal stenosis. The left middle and anterior cerebral arteries are  patent without significant proximal stenosis. 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 A 1-2 mm vascular protrusion arising inferiorly from the paraclinoid right internal carotid artery is favored to reflect an infundibulum. Tiny aneurysm not excluded. Posterior circulation: The intracranial vertebral arteries are patent without significant stenosis, as is the basilar artery.The posterior cerebral arteries are patent bilaterally without significant proximal stenosis. Venous sinuses: Within the limitations of contrast timing, no evidence of thrombosis. Anatomic variants: None significant Review of the MIP images confirms the above findings IMPRESSION: CT head: No CT evidence of acute intracranial abnormality. Nonspecific 6 mm well-circumscribed lytic lucency within the right frontal calvarium. CTA head: No intracranial large vessel occlusion or proximal high-grade arterial stenosis. Atherosclerotic calcification of the carotid artery siphons with regions of no more than mild luminal narrowing. 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 A 1-2 mm vascular protrusion arising inferiorly from the paraclinoid right ICA is favored to reflect an infundibulum, with tiny aneurysm not excluded. CTA neck: Mild atherosclerotic plaque within the visualized aortic arch and proximal branch vessels. Calcified and noncalcified plaque also present at the carotid bifurcations bilaterally. The bilateral common carotid, internal carotid and vertebral arteries are patent within the neck without significant stenosis. Mild generalized prominence of the thyroid gland. Correlate clinically and with relevant laboratory values. 3 mm left thyroid lobe nodule not meeting consensus criteria for ultrasound follow-up. Electronically Signed   By: Kellie Simmering   On: 06/15/2019 17:09   Mr Brain W Wo Contrast  Result Date:  06/16/2019 CLINICAL DATA:  Headache. Normal neurological exam. Altered mental status the last 2 days. EXAM: MRI HEAD WITHOUT AND WITH CONTRAST TECHNIQUE: Multiplanar, multiecho pulse sequences of the brain and surrounding structures were obtained without and with intravenous contrast. CONTRAST:  56mL GADAVIST GADOBUTROL 1 MMOL/ML IV SOLN COMPARISON:  CT studies done yesterday. FINDINGS: Brain: The brain has a normal appearance without evidence of malformation, atrophy, old or acute small or large vessel infarction, mass lesion, hemorrhage, hydrocephalus or extra-axial collection. After contrast administration no significant abnormal enhancement occurs. Incidental venous angioma the left superior frontal lobe. Vascular: Major vessels at the base of the brain show flow. Venous sinuses appear patent. Skull and upper cervical spine: Normal. Sinuses/Orbits: Clear/normal. Other: None significant. IMPRESSION: No abnormality seen to explain the clinical presentation. Normal appearance of the brain parenchyma for age. 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 Electronically Signed   By: Nelson Chimes  M.D.   On: 06/16/2019 15:12     Microbiology: Recent Results (from the past 240 hour(s))  SARS CORONAVIRUS 2 (Khalid Lacko 6-24 HRS) Nasopharyngeal Nasopharyngeal Swab     Status: None   Collection Time: 06/15/19  6:24 PM   Specimen: Nasopharyngeal Swab  Result Value Ref Range Status   SARS Coronavirus 2 NEGATIVE NEGATIVE Final    Comment: (NOTE) SARS-CoV-2 target nucleic acids are NOT DETECTED. The SARS-CoV-2 RNA is generally detectable in upper and lower respiratory specimens during the acute phase of infection. Negative results do not preclude SARS-CoV-2 infection, do not rule out co-infections with other pathogens, and should not be used as the sole basis for treatment or other patient management decisions. Negative results must be combined with clinical observations, patient history, and  epidemiological information. The expected result is Negative. Fact Sheet for Patients: SugarRoll.be Fact Sheet for Healthcare Providers: https://www.woods-mathews.com/ This test is not yet approved or cleared by the [place] FDA and  has been authorized for detection and/or diagnosis of SARS-CoV-2 by FDA under an Emergency Use Authorization (EUA). This EUA will remain  in effect (meaning this test can be used) for the duration of the COVID-19 declaration under Section 56 4(b)(1) of the Act, 21 U.S.C. section 360bbb-3(b)(1), unless the authorization is terminated or revoked sooner. Performed at [facility], [place] [address] [place], [place] [address]      Labs: Basic Metabolic Panel: Recent Labs  Lab 06/15/19 1442 06/15/19 1453 06/16/19 0549  NA 137  --  141  K 2.2*  --  3.3*  CL 99  --  106  CO2 26  --  24  GLUCOSE 111*  --  97  BUN 13  --  12  CREATININE 0.56 0.60 0.67  CALCIUM 9.2  --  9.0  MG 2.2  --   --    Liver Function Tests: Recent Labs  Lab 06/15/19 1442 06/16/19 0549  AST 28 22  ALT 21 19  ALKPHOS 54 44  BILITOT 0.6 0.7  PROT 7.8 6.8  ALBUMIN 4.5 3.8   No results for input(s): LIPASE, AMYLASE in the last 168 hours. No results for input(s): AMMONIA in the last 168 hours. CBC: Recent Labs  Lab 06/15/19 1442 06/16/19 0549  WBC 5.7 4.8  NEUTROABS 4.2  --   HGB 13.0 12.0  HCT 38.9 36.1  MCV 92.2 94.0  PLT 342 321   Cardiac Enzymes: No results for input(s): CKTOTAL, CKMB, CKMBINDEX, TROPONINI in the last 168 hours. BNP: Invalid input(s): POCBNP CBG: No results for input(s): GLUCAP in the last 168 hours.  Time coordinating discharge:  36 minutes  Signed:  Orson Eva, DO [facility] Pager: [phone] 06/16/2019, 4:45 PM

## 2019-06-16 NOTE — Progress Notes (Signed)
Patients heart rate sustaining in the 40's at rest. MD notified. No new orders. Patient stated that she is no longer having a headache and wants to sleep.

## 2019-06-17 ENCOUNTER — Other Ambulatory Visit: Payer: Self-pay

## 2019-06-17 ENCOUNTER — Encounter: Payer: Self-pay | Admitting: Family Medicine

## 2019-06-17 ENCOUNTER — Telehealth: Payer: Self-pay

## 2019-06-17 ENCOUNTER — Ambulatory Visit (INDEPENDENT_AMBULATORY_CARE_PROVIDER_SITE_OTHER): Payer: Medicare Other | Admitting: Family Medicine

## 2019-06-17 ENCOUNTER — Encounter (INDEPENDENT_AMBULATORY_CARE_PROVIDER_SITE_OTHER): Payer: Self-pay

## 2019-06-17 VITALS — BP 160/84 | Temp 98.4°F | Ht 64.0 in | Wt 138.0 lb

## 2019-06-17 DIAGNOSIS — Z09 Encounter for follow-up examination after completed treatment for conditions other than malignant neoplasm: Secondary | ICD-10-CM

## 2019-06-17 DIAGNOSIS — F5101 Primary insomnia: Secondary | ICD-10-CM

## 2019-06-17 DIAGNOSIS — R51 Headache: Secondary | ICD-10-CM | POA: Diagnosis not present

## 2019-06-17 DIAGNOSIS — R9402 Abnormal brain scan: Secondary | ICD-10-CM

## 2019-06-17 DIAGNOSIS — I10 Essential (primary) hypertension: Secondary | ICD-10-CM

## 2019-06-17 DIAGNOSIS — R519 Headache, unspecified: Secondary | ICD-10-CM

## 2019-06-17 MED ORDER — POTASSIUM CHLORIDE CRYS ER 20 MEQ PO TBCR: 20.0000 meq | EXTENDED_RELEASE_TABLET | Freq: Every day | ORAL | 0 refills | Status: DC

## 2019-06-17 NOTE — Assessment & Plan Note (Addendum)
Intractable headache recently hospitalized for headache, refer neurology, abnormal MRI brain

## 2019-06-17 NOTE — Progress Notes (Signed)
Virtual Visit via Telephone Note  I connected with Tanya [name] on 06/17/19 at  2:40 PM EDT by telephone and verified that I am speaking with the correct person using two identifiers.  Location: Patient: in office unknown to Provider! Provider: *in office   I discussed the limitations, risks, security and privacy concerns of performing an evaluation and management service by telephone and the availability of in person appointments. I also discussed with the patient that there may be a patient responsible charge related to this service. The patient expressed understanding and agreed to proceed.   History of Present Illness: Pt in for f/u recent hospitalization from 9/14 to 06/16/2019 for severe  headache. Has had an over 5 year h/o headaches, states however this was so severe and accompanied by vomiting, that she went to the eD, at the time of admission her potassium was low, likely due to the vomiting. Has had no medication today stating that whenever she does  Take her meds, she gets severe headaches, her BP in office is however, markedly elevated , so she is advised and will comply with taking her meds C/o excessive cost of medication for sleep though it works , will need to check on this Hospital course and results are reviewed with Pyt and all questions answered Denies recent fever or chills. Denies sinus pressure, nasal congestion, ear pain or sore throat. Denies chest congestion, productive cough or wheezing. Denies chest pains, palpitations and leg swelling c/o light headedness Denies abdominal pain, nausea, vomiting,diarrhea or constipation.   Denies dysuria, frequency, hesitancy or incontinence. Denies joint pain, swelling and limitation in mobility.  Denies depression, anxiety c/o insomnia. Denies skin break down or rash.       Observations/Objective: Temp 98.4 F (36.9 C) (Temporal)   Ht 5\' 4"  (1.626 m)   Wt 138 lb (62.6 kg)   SpO2 98%   BMI 23.69 kg/m    Good  communication with no confusion and intact memory. Alert and oriented x 3 No signs of respiratory distress during speech    Assessment and Plan:  Headache Intractable headache recently hospitalized for headache, refer neurology, abnormal MRI brain  Hospital discharge follow-up Patient in for follow up of recent hospitalization. Discharge summary, and laboratory and radiology data are reviewed, and any questions or concerns about recent hospitalization are discussed. Specific issues requiring follow up are specifically addressed.   Essential hypertension Uncontrolled, non compliant with treatment, pt to take medication as directed   Follow Up Instructions:    I discussed the assessment and treatment plan with the patient. The patient was provided an opportunity to ask questions and all were answered. The patient agreed with the plan and demonstrated an understanding of the instructions.   The patient was advised to call back or seek an in-person evaluation if the symptoms worsen or if the condition fails to improve as anticipated.  I provided 22 minutes of non-face-to-face time during this encounter.   Tula Nakayama, MD

## 2019-06-17 NOTE — Patient Instructions (Addendum)
F/u as before in office visit with shingrix # 2, call if you need me sooner  1 week of potassium is prescribed  Non fasting CBC, chem 7 and EGFR in 2 week ( [facility]) you are being referred to Dr Merlene Laughter re headaches   We will check price of sleep medication and look for more affordable  ( now $45/ mth)

## 2019-06-17 NOTE — Telephone Encounter (Signed)
Transition Care Management Follow-up Telephone Call   Date discharged? 06/16/19               How have you been since you were released from the hospital? I'm doing great.   Do you understand why you were in the hospital? My potassium was very low    Do you understand the discharge instructions? yes   Where were you discharged to? home   Items Reviewed:  Medications reviewed: yes  Allergies reviewed: yes  Dietary changes reviewed: heart healthy  Referrals reviewed: no new referrals   Functional Questionnaire:   Activities of Daily Living (ADLs): able to do adl's by herself     Any transportation issues/concerns?: no   Any patient concerns? no   Confirmed importance and date/time of follow-up visits scheduled 9 /16/2020     Confirmed with patient if condition begins to worsen call PCP or go to the ER.  Patient was given the office number and encouraged to call back with question or concerns. Yes with verbal understanding

## 2019-06-17 NOTE — Telephone Encounter (Signed)
[id number]  Attempted to contact patient to complete [facility] telephone call. No answer. Left message requesting call back. Will try again later. 1st attempt

## 2019-06-21 ENCOUNTER — Encounter: Payer: Self-pay | Admitting: Family Medicine

## 2019-06-21 ENCOUNTER — Telehealth: Payer: Self-pay | Admitting: Family Medicine

## 2019-06-21 DIAGNOSIS — Z09 Encounter for follow-up examination after completed treatment for conditions other than malignant neoplasm: Secondary | ICD-10-CM | POA: Insufficient documentation

## 2019-06-21 MED ORDER — TEMAZEPAM 15 MG PO CAPS: 15.0000 mg | ORAL_CAPSULE | Freq: Every evening | ORAL | 0 refills | Status: DC | PRN

## 2019-06-21 NOTE — Assessment & Plan Note (Signed)
Patient in for follow up of recent hospitalization. Discharge summary, and laboratory and radiology data are reviewed, and any questions or concerns about recent hospitalization are discussed. Specific issues requiring follow up are specifically addressed.  

## 2019-06-21 NOTE — Telephone Encounter (Signed)
Please check pharmacy to see if the increased dose entered is more affordable,pt paying $45 currently as copay, if it is, pls let pt know to collect on 09/22 and explain the change , thanks

## 2019-06-21 NOTE — Assessment & Plan Note (Signed)
Uncontrolled, non compliant with treatment, pt to take medication as directed

## 2019-06-21 NOTE — Assessment & Plan Note (Signed)
Controlled on current medication, however , cost is prohibitive

## 2019-06-24 NOTE — Telephone Encounter (Signed)
Called and left message on cell vm that [facility] said they didn't have her updated insurance card and once she brings it in they will give her the updated price

## 2019-06-28 ENCOUNTER — Other Ambulatory Visit: Payer: Self-pay

## 2019-06-28 ENCOUNTER — Emergency Department (HOSPITAL_COMMUNITY): Payer: Medicare Other

## 2019-06-28 ENCOUNTER — Encounter (HOSPITAL_COMMUNITY): Payer: Self-pay

## 2019-06-28 ENCOUNTER — Observation Stay (HOSPITAL_COMMUNITY)
Admission: EM | Admit: 2019-06-28 | Discharge: 2019-06-30 | Disposition: A | Discharge status: Home / Self Care | Payer: Medicare Other | Attending: Internal Medicine | Admitting: Internal Medicine

## 2019-06-28 DIAGNOSIS — Q283 Other malformations of cerebral vessels: Secondary | ICD-10-CM | POA: Diagnosis not present

## 2019-06-28 DIAGNOSIS — E039 Hypothyroidism, unspecified: Secondary | ICD-10-CM | POA: Diagnosis not present

## 2019-06-28 DIAGNOSIS — K219 Gastro-esophageal reflux disease without esophagitis: Secondary | ICD-10-CM | POA: Insufficient documentation

## 2019-06-28 DIAGNOSIS — Z8249 Family history of ischemic heart disease and other diseases of the circulatory system: Secondary | ICD-10-CM | POA: Insufficient documentation

## 2019-06-28 DIAGNOSIS — Z955 Presence of coronary angioplasty implant and graft: Secondary | ICD-10-CM | POA: Diagnosis not present

## 2019-06-28 DIAGNOSIS — R0789 Other chest pain: Secondary | ICD-10-CM | POA: Diagnosis not present

## 2019-06-28 DIAGNOSIS — Z7902 Long term (current) use of antithrombotics/antiplatelets: Secondary | ICD-10-CM | POA: Diagnosis not present

## 2019-06-28 DIAGNOSIS — R079 Chest pain, unspecified: Secondary | ICD-10-CM

## 2019-06-28 DIAGNOSIS — F419 Anxiety disorder, unspecified: Secondary | ICD-10-CM | POA: Diagnosis not present

## 2019-06-28 DIAGNOSIS — E871 Hypo-osmolality and hyponatremia: Secondary | ICD-10-CM | POA: Insufficient documentation

## 2019-06-28 DIAGNOSIS — E876 Hypokalemia: Secondary | ICD-10-CM | POA: Diagnosis present

## 2019-06-28 DIAGNOSIS — Z7982 Long term (current) use of aspirin: Secondary | ICD-10-CM | POA: Diagnosis not present

## 2019-06-28 DIAGNOSIS — E785 Hyperlipidemia, unspecified: Secondary | ICD-10-CM | POA: Diagnosis not present

## 2019-06-28 DIAGNOSIS — Z79899 Other long term (current) drug therapy: Secondary | ICD-10-CM | POA: Insufficient documentation

## 2019-06-28 DIAGNOSIS — I10 Essential (primary) hypertension: Secondary | ICD-10-CM | POA: Diagnosis not present

## 2019-06-28 DIAGNOSIS — R001 Bradycardia, unspecified: Secondary | ICD-10-CM | POA: Insufficient documentation

## 2019-06-28 DIAGNOSIS — Z20828 Contact with and (suspected) exposure to other viral communicable diseases: Secondary | ICD-10-CM | POA: Diagnosis not present

## 2019-06-28 DIAGNOSIS — R51 Headache: Secondary | ICD-10-CM | POA: Insufficient documentation

## 2019-06-28 DIAGNOSIS — I2510 Atherosclerotic heart disease of native coronary artery without angina pectoris: Secondary | ICD-10-CM | POA: Diagnosis not present

## 2019-06-28 DIAGNOSIS — Z23 Encounter for immunization: Secondary | ICD-10-CM | POA: Diagnosis present

## 2019-06-28 DIAGNOSIS — R0600 Dyspnea, unspecified: Secondary | ICD-10-CM | POA: Diagnosis present

## 2019-06-28 DIAGNOSIS — R0602 Shortness of breath: Secondary | ICD-10-CM | POA: Insufficient documentation

## 2019-06-28 DIAGNOSIS — R209 Unspecified disturbances of skin sensation: Secondary | ICD-10-CM

## 2019-06-28 DIAGNOSIS — F329 Major depressive disorder, single episode, unspecified: Secondary | ICD-10-CM | POA: Insufficient documentation

## 2019-06-28 DIAGNOSIS — G44201 Tension-type headache, unspecified, intractable: Secondary | ICD-10-CM

## 2019-06-28 HISTORY — DX: Atherosclerotic heart disease of native coronary artery without angina pectoris: I25.10

## 2019-06-28 NOTE — ED Triage Notes (Signed)
Pt reports shortness of breath and chest pain that started 3 days ago. Pt reports chest is heavy, pt reports "numbness from head to toe" pt is breathing rapidly and instructed to slow down breathing. Pt 100% on room air. Pt alert and oriented x 4 and can move all extremities.

## 2019-06-28 NOTE — ED Provider Notes (Signed)
[facility] EMERGENCY DEPARTMENT Provider Note   CSN: [id number] Arrival date & time: 06/28/19  2310     History   Chief Complaint Chief Complaint  Patient presents with  . Chest Pain    HPI Tanya [name] is a 68 y.o. female.     Patient presents to the emergency department with multiple complaints.  Patient reports that she has been experiencing numbness on the right side of her body for greater than 1 day.  She reports that at 8 PM it seemed to worsen.  At the same time she developed a heaviness on the right side of her chest and shortness of breath.  Patient reports a previous history of cardiac stenting.     Past Medical History:  Diagnosis Date  . Anhedonia   . Cystitis   . Elevated liver enzymes   . Fatty liver   . GERD (gastroesophageal reflux disease)   . Hyperlipidemia   . Hypertension   . Insomnia   . Unspecified hypothyroidism   . Vaginitis     Patient Active Problem List   Diagnosis Date Noted  . Hospital discharge follow-up 06/21/2019  . Hypokalemia 06/15/2019  . Need for shingles vaccine 03/25/2019  . Educated About Covid-19 Virus Infection 02/15/2019  . Acute sciatica 02/10/2019  . CAD (coronary artery disease), native coronary artery 06/06/2018  . Headache 03/08/2018  . Primary osteoarthritis of both hands 02/13/2018  . Primary osteoarthritis of both knees 02/13/2018  . Anxiety 02/06/2017  . Pruritus 07/20/2016  . Arthritis 07/20/2016  . Tubular adenoma of colon 02/11/2016  . Coronary artery calcification seen on CAT scan - Moderate LAD lesion noted 11/16/2014  . Abnormal CT scan, chest 11/08/2014  . IGT (impaired glucose tolerance) 06/11/2014  . Osteopenia 01/27/2014  . Microscopic hematuria 01/05/2013  . Urinary incontinence, mixed 01/05/2013  . GERD (gastroesophageal reflux disease) 12/16/2009  . Hyperlipemia 04/09/2008  . Essential hypertension 04/09/2008  . Insomnia disorder 04/09/2008    Past Surgical History:  Procedure  Laterality Date  . CATARACT EXTRACTION W/PHACO Right 05/08/2019   Procedure: CATARACT EXTRACTION PHACO AND INTRAOCULAR LENS PLACEMENT (IOC);  Surgeon: Baruch Goldmann, MD;  Location: [facility];  Service: Ophthalmology;  Laterality: Right;  CDE: 10.02  . COLONOSCOPY N/A 02/08/2016   Procedure: COLONOSCOPY;  Surgeon: [name], MD;  Location: [facility];  Service: Endoscopy;  Laterality: N/A;  1200 - moved to 11:45 - Ann to notify  . CORONARY STENT INTERVENTION N/A 06/06/2018   Procedure: CORONARY STENT INTERVENTION;  Surgeon: [name], [name], MD;  Location: [place] [facility];  Service: Cardiovascular;  Laterality: N/A;  . INTRAVASCULAR PRESSURE WIRE/FFR STUDY N/A 06/06/2018   Procedure: INTRAVASCULAR PRESSURE WIRE/FFR STUDY;  Surgeon: [name], [name], MD;  Location: [place] [facility];  Service: Cardiovascular;  Laterality: N/A;  . LEFT HEART CATH AND CORONARY ANGIOGRAPHY N/A 04/24/2019   Procedure: LEFT HEART CATH AND CORONARY ANGIOGRAPHY;  Surgeon: Belva Crome, MD;  Location: [place] [facility];  Service: Cardiovascular;  Laterality: N/A;  . LEFT HEART CATHETERIZATION WITH CORONARY ANGIOGRAM N/A 11/19/2014   Procedure: LEFT HEART CATHETERIZATION WITH CORONARY ANGIOGRAM;  Surgeon: [name], MD;  Location: [facility] CATH LAB;  Service: Cardiovascular;  Laterality: N/A;  . RIGHT/LEFT HEART CATH AND CORONARY ANGIOGRAPHY N/A 06/06/2018   Procedure: RIGHT/LEFT HEART CATH AND CORONARY ANGIOGRAPHY;  Surgeon: [name], [name], MD;  Location: [facility];  Service: Cardiovascular;  Laterality: N/A;     OB History  No obstetric history on file.      Home Medications    Prior to Admission medications   Medication Sig Start Date End Date Taking? Authorizing Provider  acetaminophen (TYLENOL) 500 MG tablet Take 500 mg by mouth every 6 (six) hours as needed for headache.    [provider]  Ascorbic Acid (VITAMIN C) 1000 MG tablet Take 1,000 mg by mouth daily.     [provider]   aspirin EC 81 MG tablet Take 81 mg by mouth at bedtime.    [provider]  Biotin (BIOTIN 5000) 5 MG CAPS Take 1 capsule by mouth daily.    [provider]  busPIRone (BUSPAR) 5 MG tablet TAKE 1 TABLET TWICE A DAY (NEED FOLLOW UP APPOINTMENT WITH PCP) Patient taking differently: Take 5 mg by mouth 2 (two) times a day.  09/08/18   [name], MD  cloNIDine (CATAPRES) 0.2 MG tablet Take one tablet by mouth at bedtime for blood pressure Patient taking differently: Take 0.2 mg by mouth at bedtime.  03/25/19   [name], MD  clopidogrel (PLAVIX) 75 MG tablet Take 1 tablet (75 mg total) by mouth daily. 02/25/19   Herminio Commons, MD  fish oil-omega-3 fatty acids 1000 MG capsule Take 1,000 mg by mouth daily.     [provider]  nitroGLYCERIN (NITROSTAT) 0.4 MG SL tablet Place 1 tablet (0.4 mg total) under the tongue every 5 (five) minutes as needed. Patient taking differently: Place 0.4 mg under the tongue every 5 (five) minutes x 3 doses as needed for chest pain.  06/06/18   Cheryln Manly, NP  pantoprazole (PROTONIX) 40 MG tablet Take 1 tablet (40 mg total) by mouth daily. 02/25/19   Herminio Commons, MD  Polyethyl Glycol-Propyl Glycol (LUBRICANT EYE DROPS) 0.4-0.3 % SOLN Place 1 drop into both eyes 3 (three) times daily as needed (dry/irritated eyes.).    [provider]  potassium chloride SA (K-DUR) 20 MEQ tablet Take 1 tablet (20 mEq total) by mouth daily. 06/17/19   [name], MD  rosuvastatin (CRESTOR) 40 MG tablet Take 1 tablet (40 mg total) by mouth daily. 03/25/19   [name], MD  temazepam (RESTORIL) 15 MG capsule Take 1 capsule (15 mg total) by mouth at bedtime as needed for sleep. 06/23/19   [name], MD  triamterene-hydrochlorothiazide (MAXZIDE-25) 37.5-25 MG tablet Take 1 tablet by mouth daily. 03/25/19   [name], MD  vitamin E 1000 UNIT capsule Take 1 tablet by mouth daily. 671mg  daily     [provider]    Family History Family History  Problem Relation Age of Onset  . Dementia Mother   . Heart disease Mother        heart attack   . Hypertension Mother   . Heart attack Father   . Heart disease Father        heart attack   . Hypertension Father     Social History Social History   Tobacco Use  . Smoking status: Never Smoker  . Smokeless tobacco: Never Used  Substance Use Topics  . Alcohol use: No    Alcohol/week: 0.0 standard drinks  . Drug use: No     Allergies   Patient has no known allergies.   Review of Systems Review of Systems  Respiratory: Positive for chest tightness and shortness of breath.   Neurological: Positive for numbness.  All other systems reviewed and are negative.  Physical Exam Updated Vital Signs BP (!) 153/76 (BP Location: Right Arm)   Pulse 71   Temp 98.3 F (36.8 C) (Oral)   Resp 20   Ht 5\' 4"  (1.626 m)   Wt 59 kg   SpO2 100%   BMI 22.31 kg/m   Physical Exam Vitals signs and nursing note reviewed.  Constitutional:      General: She is not in acute distress.    Appearance: Normal appearance. She is well-developed.  HENT:     Head: Normocephalic and atraumatic.     Right Ear: Hearing normal.     Left Ear: Hearing normal.     Nose: Nose normal.  Eyes:     Conjunctiva/sclera: Conjunctivae normal.     Pupils: Pupils are equal, round, and reactive to light.  Neck:     Musculoskeletal: Normal range of motion and neck supple.  Cardiovascular:     Rate and Rhythm: Regular rhythm.     Heart sounds: S1 normal and S2 normal. No murmur. No friction rub. No gallop.   Pulmonary:     Effort: Pulmonary effort is normal. No respiratory distress.     Breath sounds: Normal breath sounds.  Chest:     Chest wall: No tenderness.  Abdominal:     General: Bowel sounds are normal.     Palpations: Abdomen is soft.     Tenderness: There is no abdominal tenderness. There is no guarding or rebound. Negative signs  include Murphy's sign and McBurney's sign.     Hernia: No hernia is present.  Musculoskeletal: Normal range of motion.  Skin:    General: Skin is warm and dry.     Findings: No rash.  Neurological:     Mental Status: She is alert and oriented to person, place, and time.     GCS: GCS eye subscore is 4. GCS verbal subscore is 5. GCS motor subscore is 6.     Cranial Nerves: No cranial nerve deficit.     Sensory: No sensory deficit.     Coordination: Coordination normal.     Comments: Extraocular muscle movement: normal No visual field cut Pupils: equal and reactive both direct and consensual response is normal No nystagmus present    Sensory function is intact to light touch, pinprick Proprioception intact  Grip strength 5/5 symmetric in upper extremities No pronator drift Normal finger to nose bilaterally  Lower extremity strength 5/5 against gravity Normal heel to shin bilaterally  Gait: normal   Psychiatric:        Speech: Speech normal.        Behavior: Behavior normal.        Thought Content: Thought content normal.      ED Treatments / Results  Labs (all labs ordered are listed, but only abnormal results are displayed) Labs Reviewed  COMPREHENSIVE METABOLIC PANEL - Abnormal; Notable for the following components:      Result Value   Sodium 131 (*)    Potassium 2.2 (*)    Chloride 95 (*)    Glucose, Bld 108 (*)    All other components within normal limits  ETHANOL  PROTIME-INR  APTT  CBC  DIFFERENTIAL  D-DIMER, QUANTITATIVE (NOT AT [facility])  BRAIN NATRIURETIC PEPTIDE  RAPID URINE DRUG SCREEN, HOSP PERFORMED  URINALYSIS, ROUTINE W REFLEX MICROSCOPIC  MAGNESIUM  TROPONIN I (HIGH SENSITIVITY)  TROPONIN I (HIGH SENSITIVITY)    EKG EKG Interpretation  Date/Time:  Sunday June 28 2019 23:23:01 EDT Ventricular Rate:  73 PR  Interval:    QRS Duration: 117 QT Interval:  418 QTC Calculation: 461 R Axis:   85 Text Interpretation:  Sinus rhythm Nonspecific  intraventricular conduction delay No significant change since last tracing Confirmed by Orpah Greek [phone]) on 06/28/2019 11:43:01 PM   Radiology Ct Head Wo Contrast  Result Date: 06/29/2019 CLINICAL DATA:  68 year old female with numbness. Recent headache. EXAM: CT HEAD WITHOUT CONTRAST TECHNIQUE: Contiguous axial images were obtained from the base of the skull through the vertex without intravenous contrast. COMPARISON:  Brain MRI 06/16/2019, head CT 06/15/2019. FINDINGS: Brain: No midline shift, ventriculomegaly, mass effect, evidence of mass lesion, intracranial hemorrhage or evidence of cortically based acute infarction. Gray-white matter differentiation is within normal limits throughout the brain. Partially empty sella redemonstrated. Vascular: Calcified atherosclerosis at the skull base. No suspicious intracranial vascular hyperdensity. Skull: Negative; a small benign hemangioma was seen to correspond to the 6 millimeter lucent area described on 06/15/2019. Sinuses/Orbits: Visualized paranasal sinuses and mastoids are stable and well pneumatized. Other: Visualized orbits and scalp soft tissues are within normal limits. IMPRESSION: Stable and negative noncontrast CT appearance of the brain. Electronically Signed   By: Genevie Ann M.D.   On: 06/29/2019 00:12   Dg Chest Port 1 View  Result Date: 06/29/2019 CLINICAL DATA:  Shortness of breath. EXAM: PORTABLE CHEST 1 VIEW COMPARISON:  04/09/2016 FINDINGS: The cardiomediastinal contours are normal. The lungs are clear. Pulmonary vasculature is normal. No consolidation, pleural effusion, or pneumothorax. No acute osseous abnormalities are seen. IMPRESSION: No acute chest findings. Electronically Signed   By: Keith Rake M.D.   On: 06/29/2019 01:21    Procedures Procedures (including critical care time)  Medications Ordered in ED Medications  potassium chloride SA (K-DUR) CR tablet 40 mEq (has no administration in time range)  potassium  chloride 10 mEq in 100 mL IVPB (has no administration in time range)     Initial Impression / Assessment and Plan / ED Course  I have reviewed the triage vital signs and the nursing notes.  Pertinent labs & imaging results that were available during my care of the patient were reviewed by me and considered in my medical decision making (see chart for details).        Patient presents to the emergency department with multiple complaints.  Patient reports that she has been experiencing numbness and tingling on the right side of her body for an unknown period of time.  She reports that it worsened today.  She reports subjective change in sensation on the right side but objectively, neurologic exam is normal.  CT head does not show any evidence of an acute infarct.  Patient also complaining of right-sided chest pain.  She does have a history of cardiac stent.  EKG does not show obvious ischemia or infarct and first troponin is negative.  Additionally, patient complaining of shortness of breath.  She appears very anxious.  Chest x-ray is clear and lung sounds are normal.  BNP is normal.  D-dimer was also normal.  Patient's potassium is 2.2.  She had hospitalization 2 weeks ago with similar presentation.  Will check magnesium, initiate administration of potassium and admit to hospital.  CRITICAL CARE Performed by: Orpah Greek   Total critical care time: 35 minutes  Critical care time was exclusive of separately billable procedures and treating other patients.  Critical care was necessary to treat or prevent imminent or life-threatening deterioration.  Critical care was time spent personally by me on the following  activities: development of treatment plan with patient and/or surrogate as well as nursing, discussions with consultants, evaluation of patient's response to treatment, examination of patient, obtaining history from patient or surrogate, ordering and performing treatments  and interventions, ordering and review of laboratory studies, ordering and review of radiographic studies, pulse oximetry and re-evaluation of patient's condition.   Final Clinical Impressions(s) / ED Diagnoses   Final diagnoses:  Chest pain, unspecified type  Hypokalemia    ED Discharge Orders    None       Orpah Greek, MD 06/29/19 0130

## 2019-06-29 ENCOUNTER — Other Ambulatory Visit (HOSPITAL_COMMUNITY): Payer: MEDICARE

## 2019-06-29 ENCOUNTER — Emergency Department (HOSPITAL_COMMUNITY): Payer: Medicare Other

## 2019-06-29 ENCOUNTER — Observation Stay (HOSPITAL_COMMUNITY): Payer: Medicare Other

## 2019-06-29 ENCOUNTER — Observation Stay (HOSPITAL_BASED_OUTPATIENT_CLINIC_OR_DEPARTMENT_OTHER): Payer: Medicare Other

## 2019-06-29 ENCOUNTER — Encounter (HOSPITAL_COMMUNITY): Payer: Self-pay | Admitting: Radiology

## 2019-06-29 DIAGNOSIS — E876 Hypokalemia: Secondary | ICD-10-CM | POA: Diagnosis not present

## 2019-06-29 DIAGNOSIS — R0789 Other chest pain: Secondary | ICD-10-CM | POA: Diagnosis not present

## 2019-06-29 DIAGNOSIS — R079 Chest pain, unspecified: Secondary | ICD-10-CM | POA: Diagnosis not present

## 2019-06-29 DIAGNOSIS — R0600 Dyspnea, unspecified: Secondary | ICD-10-CM

## 2019-06-29 DIAGNOSIS — R209 Unspecified disturbances of skin sensation: Secondary | ICD-10-CM

## 2019-06-29 DIAGNOSIS — I25119 Atherosclerotic heart disease of native coronary artery with unspecified angina pectoris: Secondary | ICD-10-CM | POA: Diagnosis not present

## 2019-06-29 DIAGNOSIS — E782 Mixed hyperlipidemia: Secondary | ICD-10-CM

## 2019-06-29 DIAGNOSIS — R0602 Shortness of breath: Secondary | ICD-10-CM | POA: Diagnosis not present

## 2019-06-29 LAB — ETHANOL: Alcohol, Ethyl (B): <10 mg/dL (ref <10)

## 2019-06-29 LAB — CBC
HCT: 37.1 % (ref 36.0–46.0)
HCT: 39.9 % (ref 36.0–46.0)
Hemoglobin: 12.7 g/dL (ref 12.0–15.0)
Hemoglobin: 13.1 g/dL (ref 12.0–15.0)
MCH: 30.5 pg (ref 26.0–34.0)
MCH: 31 pg (ref 26.0–34.0)
MCHC: 32.8 g/dL (ref 30.0–36.0)
MCHC: 34.2 g/dL (ref 30.0–36.0)
MCV: 90.5 fL (ref 80.0–100.0)
MCV: 92.8 fL (ref 80.0–100.0)
Platelets: 355 10*3/uL (ref 150–400)
Platelets: 357 10*3/uL (ref 150–400)
RBC: 4.1 MIL/uL (ref 3.87–5.11)
RBC: 4.3 MIL/uL (ref 3.87–5.11)
RDW: 12.8 % (ref 11.5–15.5)
RDW: 13.2 % (ref 11.5–15.5)
WBC: 4.9 10*3/uL (ref 4.0–10.5)
WBC: 6.6 10*3/uL (ref 4.0–10.5)
nRBC: 0 % (ref 0.0–0.2)
nRBC: 0 % (ref 0.0–0.2)

## 2019-06-29 LAB — COMPREHENSIVE METABOLIC PANEL
ALT: 19 U/L (ref 0–44)
ALT: 17 U/L (ref 0–44)
AST: 27 U/L (ref 15–41)
AST: 23 U/L (ref 15–41)
Albumin: 4.5 g/dL (ref 3.5–5.0)
Albumin: 4.1 g/dL (ref 3.5–5.0)
Alkaline Phosphatase: 55 U/L (ref 38–126)
Alkaline Phosphatase: 48 U/L (ref 38–126)
Anion gap: 14 (ref 5–15)
Anion gap: 9 (ref 5–15)
BUN: 17 mg/dL (ref 8–23)
BUN: 13 mg/dL (ref 8–23)
CO2: 22 mmol/L (ref 22–32)
CO2: 23 mmol/L (ref 22–32)
Calcium: 9.4 mg/dL (ref 8.9–10.3)
Calcium: 9 mg/dL (ref 8.9–10.3)
Chloride: 95 mmol/L — ABNORMAL LOW (ref 98–111)
Chloride: 107 mmol/L (ref 98–111)
Creatinine, Ser: 0.74 mg/dL (ref 0.44–1.00)
Creatinine, Ser: 0.66 mg/dL (ref 0.44–1.00)
GFR calc Af Amer: >60 mL/min (ref >60)
GFR calc Af Amer: >60 mL/min (ref >60)
GFR calc non Af Amer: >60 mL/min (ref >60)
GFR calc non Af Amer: >60 mL/min (ref >60)
Glucose, Bld: 108 mg/dL — ABNORMAL HIGH (ref 70–99)
Glucose, Bld: 109 mg/dL — ABNORMAL HIGH (ref 70–99)
Potassium: 2.2 mmol/L — CL (ref 3.5–5.1)
Potassium: 3.2 mmol/L — ABNORMAL LOW (ref 3.5–5.1)
Sodium: 131 mmol/L — ABNORMAL LOW (ref 135–145)
Sodium: 139 mmol/L (ref 135–145)
Total Bilirubin: 0.7 mg/dL (ref 0.3–1.2)
Total Bilirubin: 0.8 mg/dL (ref 0.3–1.2)
Total Protein: 7.5 g/dL (ref 6.5–8.1)
Total Protein: 7 g/dL (ref 6.5–8.1)

## 2019-06-29 LAB — DIFFERENTIAL
Abs Immature Granulocytes: 0.01 10*3/uL (ref 0.00–0.07)
Basophils Absolute: 0.1 10*3/uL (ref 0.0–0.1)
Basophils Relative: 1 %
Eosinophils Absolute: 0.1 10*3/uL (ref 0.0–0.5)
Eosinophils Relative: 1 %
Immature Granulocytes: 0 %
Lymphocytes Relative: 28 %
Lymphs Abs: 1.8 10*3/uL (ref 0.7–4.0)
Monocytes Absolute: 0.6 10*3/uL (ref 0.1–1.0)
Monocytes Relative: 10 %
Neutro Abs: 4 10*3/uL (ref 1.7–7.7)
Neutrophils Relative %: 60 %

## 2019-06-29 LAB — LIPID PANEL
Cholesterol: 107 mg/dL (ref 0–200)
HDL: 48 mg/dL (ref >40)
LDL Cholesterol: 48 mg/dL (ref 0–99)
Total CHOL/HDL Ratio: 2.2 RATIO
Triglycerides: 56 mg/dL (ref <150)
VLDL: 11 mg/dL (ref 0–40)

## 2019-06-29 LAB — MAGNESIUM: Magnesium: 2.1 mg/dL (ref 1.7–2.4)

## 2019-06-29 LAB — URINALYSIS, ROUTINE W REFLEX MICROSCOPIC
Bilirubin Urine: NEGATIVE
Glucose, UA: NEGATIVE mg/dL
Hgb urine dipstick: NEGATIVE
Ketones, ur: NEGATIVE mg/dL
Leukocytes,Ua: NEGATIVE
Nitrite: NEGATIVE
Protein, ur: NEGATIVE mg/dL
Specific Gravity, Urine: 1.011 (ref 1.005–1.030)
pH: 7 (ref 5.0–8.0)

## 2019-06-29 LAB — RAPID URINE DRUG SCREEN, HOSP PERFORMED
Amphetamines: NOT DETECTED
Barbiturates: NOT DETECTED
Benzodiazepines: POSITIVE — AB
Cocaine: NOT DETECTED
Opiates: NOT DETECTED
Tetrahydrocannabinol: NOT DETECTED

## 2019-06-29 LAB — PROTIME-INR
INR: 1 (ref 0.8–1.2)
Prothrombin Time: 13.2 seconds (ref 11.4–15.2)

## 2019-06-29 LAB — D-DIMER, QUANTITATIVE: D-Dimer, Quant: 0.27 ug/mL-FEU (ref 0.00–0.50)

## 2019-06-29 LAB — ECHOCARDIOGRAM COMPLETE
Height: 64 in
Weight: 2080 oz

## 2019-06-29 LAB — TROPONIN I (HIGH SENSITIVITY)
Troponin I (High Sensitivity): 4 ng/L (ref <18)
Troponin I (High Sensitivity): 6 ng/L (ref <18)
Troponin I (High Sensitivity): 5.7 ng/L (ref <18)

## 2019-06-29 LAB — BRAIN NATRIURETIC PEPTIDE: B Natriuretic Peptide: 14 pg/mL (ref 0.0–100.0)

## 2019-06-29 LAB — SARS CORONAVIRUS 2 (TAT 6-24 HRS): SARS Coronavirus 2: NEGATIVE

## 2019-06-29 LAB — APTT: aPTT: 27 seconds (ref 24–36)

## 2019-06-29 MED ORDER — SODIUM CHLORIDE 0.9 % IV SOLN: 250.0000 mL | INTRAVENOUS | Status: DC | PRN

## 2019-06-29 MED ORDER — CLONIDINE HCL 0.2 MG PO TABS: 0.2000 mg | ORAL_TABLET | Freq: Every day | ORAL | Status: DC

## 2019-06-29 MED ORDER — ACETAMINOPHEN 650 MG RE SUPP: 650.0000 mg | Freq: Four times a day (QID) | RECTAL | Status: DC | PRN

## 2019-06-29 MED ORDER — HYDRALAZINE HCL 25 MG PO TABS: 25.0000 mg | ORAL_TABLET | Freq: Three times a day (TID) | ORAL | Status: DC

## 2019-06-29 MED ORDER — PANTOPRAZOLE SODIUM 40 MG PO TBEC
40.0000 mg | DELAYED_RELEASE_TABLET | Freq: Every day | ORAL | Status: DC
  Administered 2019-06-29 – 2019-06-30 (×2): 40 mg via ORAL
  Filled 2019-06-29 (×2): qty 1

## 2019-06-29 MED ORDER — ACETAMINOPHEN 325 MG PO TABS: 650.0000 mg | ORAL_TABLET | Freq: Four times a day (QID) | ORAL | Status: DC | PRN

## 2019-06-29 MED ORDER — ENOXAPARIN SODIUM 40 MG/0.4ML ~~LOC~~ SOLN
40.0000 mg | SUBCUTANEOUS | Status: DC
  Administered 2019-06-29 – 2019-06-30 (×2): 40 mg via SUBCUTANEOUS
  Filled 2019-06-29 (×2): qty 0.4

## 2019-06-29 MED ORDER — CLOPIDOGREL BISULFATE 75 MG PO TABS
75.0000 mg | ORAL_TABLET | Freq: Every day | ORAL | Status: DC
  Administered 2019-06-29 – 2019-06-30 (×2): 75 mg via ORAL
  Filled 2019-06-29 (×2): qty 1

## 2019-06-29 MED ORDER — POTASSIUM CHLORIDE IN NACL 20-0.9 MEQ/L-% IV SOLN
INTRAVENOUS | Status: AC
  Administered 2019-06-29 – 2019-06-30 (×2): via INTRAVENOUS
  Filled 2019-06-29: qty 1000

## 2019-06-29 MED ORDER — TEMAZEPAM 15 MG PO CAPS: 15.0000 mg | ORAL_CAPSULE | Freq: Every evening | ORAL | Status: DC | PRN

## 2019-06-29 MED ORDER — BUSPIRONE HCL 5 MG PO TABS
5.0000 mg | ORAL_TABLET | Freq: Two times a day (BID) | ORAL | Status: DC
  Administered 2019-06-29 – 2019-06-30 (×3): 5 mg via ORAL
  Filled 2019-06-29 (×2): qty 1

## 2019-06-29 MED ORDER — POTASSIUM CHLORIDE CRYS ER 20 MEQ PO TBCR
40.0000 meq | EXTENDED_RELEASE_TABLET | Freq: Once | ORAL | Status: AC
  Administered 2019-06-29: 02:00:00 40 meq via ORAL
  Filled 2019-06-29: qty 2

## 2019-06-29 MED ORDER — ROSUVASTATIN CALCIUM 20 MG PO TABS
40.0000 mg | ORAL_TABLET | Freq: Every day | ORAL | Status: DC
  Administered 2019-06-29 – 2019-06-30 (×2): 40 mg via ORAL
  Filled 2019-06-29 (×2): qty 1
  Filled 2019-06-29: qty 2
  Filled 2019-06-29: qty 1

## 2019-06-29 MED ORDER — ASPIRIN EC 81 MG PO TBEC
81.0000 mg | DELAYED_RELEASE_TABLET | Freq: Every day | ORAL | Status: DC
  Administered 2019-06-29: 81 mg via ORAL
  Filled 2019-06-29 (×3): qty 1

## 2019-06-29 MED ORDER — POLYVINYL ALCOHOL 1.4 % OP SOLN
1.0000 [drp] | Freq: Three times a day (TID) | OPHTHALMIC | Status: DC | PRN
  Filled 2019-06-29: qty 15

## 2019-06-29 MED ORDER — SODIUM CHLORIDE 0.9% FLUSH: 3.0000 mL | INTRAVENOUS | Status: DC | PRN

## 2019-06-29 MED ORDER — POTASSIUM CHLORIDE CRYS ER 20 MEQ PO TBCR
20.0000 meq | EXTENDED_RELEASE_TABLET | Freq: Every day | ORAL | Status: DC
  Administered 2019-06-29 – 2019-06-30 (×2): 20 meq via ORAL
  Filled 2019-06-29 (×2): qty 1

## 2019-06-29 MED ORDER — POTASSIUM CHLORIDE 10 MEQ/100ML IV SOLN
10.0000 meq | INTRAVENOUS | Status: AC
  Administered 2019-06-29 (×4): 10 meq via INTRAVENOUS
  Filled 2019-06-29 (×4): qty 100

## 2019-06-29 NOTE — ED Notes (Signed)
CRITICAL VALUE ALERT  Critical Value:  Potassium 2.2  Date & Time Notied:  1:11 06/29/2019  Provider Notified: [email or web address]  Orders Received/Actions taken: n/a

## 2019-06-29 NOTE — H&P (Signed)
TRH H&P    Patient Demographics:    Tanya [name], is a 68 y.o. female  MRN: [id number]  DOB - Feb 08, 1951  Admit Date - 06/28/2019  Referring MD/NP/PA:  Scot Jun  Outpatient Primary MD for the patient is [name], MD  Patient coming from:  home  Chief complaint- dyspnea, numnbess,    HPI:    Tanya [name]  is a 68 y.o. 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    In ED,   T 98.3, P 71  R 20 Bp 153/76  Pox 100% on RA Wt 59kg  CXR IMPRESSION: No acute chest findings.  CT brain IMPRESSION: Stable and negative noncontrast CT appearance of the brain.  ETOH <10  INR 1.0  Wbc 6.6, Hgb 12.7, Plt 357 Na 131, K 2.2, Magnesium 2.1 Bun 17, Creatinine 0.74 Ast 27, Alt 19  Trop 4-> 6  BNP 14  Pt will be admitted for dyspnea , chest heaviness, right face, and right arm/leg numbness, and severe hypokalemia.    Review of heart cath 04/24/2019  40% eccentric mid LAD in-stent restenosis.  At completion of stent procedure in September 2020, there was residual stent deformity in this region.  There is no high-grade obstruction in the LAD stent.  The LAD is otherwise normal.  Normal left main  Normal circumflex  Normal RCA  Normal LV systolic function and end-diastolic pressure  RECOMMENDATIONS:   Dyspnea cannot be explained by findings on today's study based on absence of significant coronary obstruction and normal LV hemodynamics.  Continue dual antiplatelet therapy beyond 12 months due to overlapping stents in  the LAD.    Review of systems:    In addition to the HPI above,  No Fever-chills, No Headache, No changes with Vision or hearing, No problems swallowing food or Liquids, No Cough No Abdominal pain, No Nausea or Vomiting, bowel movements are regular, No Blood in stool or Urine, No dysuria, No new skin rashes or bruises, No new joints pains-aches,  No new weakness,   No recent weight gain or loss, No polyuria, polydypsia or polyphagia, No significant Mental Stressors.  All other systems reviewed and are negative.    Past History of the following :    Past Medical History:  Diagnosis Date  . Anhedonia   . Cystitis   . Elevated liver enzymes   . Fatty liver   . GERD (gastroesophageal reflux disease)   . Hyperlipidemia   . Hypertension   . Insomnia   . Unspecified hypothyroidism   . Vaginitis       Past Surgical History:  Procedure Laterality Date  . CATARACT EXTRACTION W/PHACO Right 05/08/2019   Procedure:  CATARACT EXTRACTION PHACO AND INTRAOCULAR LENS PLACEMENT (IOC);  Surgeon: Baruch Goldmann, MD;  Location: [facility];  Service: Ophthalmology;  Laterality: Right;  CDE: 10.02  . COLONOSCOPY N/A 02/08/2016   Procedure: COLONOSCOPY;  Surgeon: [name], MD;  Location: [facility];  Service: Endoscopy;  Laterality: N/A;  1200 - moved to 11:45 - Ann to notify  . CORONARY STENT INTERVENTION N/A 06/06/2018   Procedure: CORONARY STENT INTERVENTION;  Surgeon: [name], [name], MD;  Location: [facility];  Service: Cardiovascular;  Laterality: N/A;  . INTRAVASCULAR PRESSURE WIRE/FFR STUDY N/A 06/06/2018   Procedure: INTRAVASCULAR PRESSURE WIRE/FFR STUDY;  Surgeon: [name], [name], MD;  Location: [facility];  Service: Cardiovascular;  Laterality: N/A;  . LEFT HEART CATH AND CORONARY ANGIOGRAPHY N/A 04/24/2019   Procedure: LEFT HEART CATH AND CORONARY ANGIOGRAPHY;  Surgeon: Belva Crome, MD;  Location: [facility];  Service: Cardiovascular;  Laterality: N/A;   . LEFT HEART CATHETERIZATION WITH CORONARY ANGIOGRAM N/A 11/19/2014   Procedure: LEFT HEART CATHETERIZATION WITH CORONARY ANGIOGRAM;  Surgeon: [name], MD;  Location: [facility] C[facility] LAB;  Service: Cardiovascular;  Laterality: N/A;  . RIGHT/LEFT HEART CATH AND CORONARY ANGIOGRAPHY N/A 06/06/2018   Procedure: RIGHT/LEFT HEART CATH AND CORONARY ANGIOGRAPHY;  Surgeon: [name], [name], MD;  Location: [facility];  Service: Cardiovascular;  Laterality: N/A;      Social History:      Social History   Tobacco Use  . Smoking status: Never Smoker  . Smokeless tobacco: Never Used  Substance Use Topics  . Alcohol use: No    Alcohol/week: 0.0 standard drinks       Family History :     Family History  Problem Relation Age of Onset  . Dementia Mother   . Heart disease Mother        heart attack   . Hypertension Mother   . Heart attack Father   . Heart disease Father        heart attack   . Hypertension Father        Home Medications:   Prior to Admission medications   Medication Sig Start Date End Date Taking? Authorizing Provider  acetaminophen (TYLENOL) 500 MG tablet Take 500 mg by mouth every 6 (six) hours as needed for headache.    [provider]  Ascorbic Acid (VITAMIN C) 1000 MG tablet Take 1,000 mg by mouth daily.     [provider]  aspirin EC 81 MG tablet Take 81 mg by mouth at bedtime.    [provider]  Biotin (BIOTIN 5000) 5 MG CAPS Take 1 capsule by mouth daily.    [provider]  busPIRone (BUSPAR) 5 MG tablet TAKE 1 TABLET TWICE A DAY (NEED FOLLOW UP APPOINTMENT WITH PCP) Patient taking differently: Take 5 mg by mouth 2 (two) times a day.  09/08/18   [name], MD  cloNIDine (CATAPRES) 0.2 MG tablet Take one tablet by mouth at bedtime for blood pressure Patient taking differently: Take 0.2 mg by mouth at bedtime.  03/25/19   [name], MD  clopidogrel (PLAVIX) 75 MG tablet Take 1 tablet (75 mg total) by  mouth daily. 02/25/19   Herminio Commons, MD  fish oil-omega-3 fatty acids 1000 MG capsule Take 1,000 mg by mouth daily.     [provider]  nitroGLYCERIN (NITROSTAT) 0.4 MG SL tablet Place 1 tablet (0.4 mg total) under the tongue every 5 (five)  minutes as needed. Patient taking differently: Place 0.4 mg under the tongue every 5 (five) minutes x 3 doses as needed for chest pain.  06/06/18   Cheryln Manly, NP  pantoprazole (PROTONIX) 40 MG tablet Take 1 tablet (40 mg total) by mouth daily. 02/25/19   Herminio Commons, MD  Polyethyl Glycol-Propyl Glycol (LUBRICANT EYE DROPS) 0.4-0.3 % SOLN Place 1 drop into both eyes 3 (three) times daily as needed (dry/irritated eyes.).    [provider]  potassium chloride SA (K-DUR) 20 MEQ tablet Take 1 tablet (20 mEq total) by mouth daily. 06/17/19   [name], MD  rosuvastatin (CRESTOR) 40 MG tablet Take 1 tablet (40 mg total) by mouth daily. 03/25/19   [name], MD  temazepam (RESTORIL) 15 MG capsule Take 1 capsule (15 mg total) by mouth at bedtime as needed for sleep. 06/23/19   [name], MD  triamterene-hydrochlorothiazide (MAXZIDE-25) 37.5-25 MG tablet Take 1 tablet by mouth daily. 03/25/19   [name], MD  vitamin E 1000 UNIT capsule Take 1 tablet by mouth daily. 671mg  daily    [provider]     Allergies:    No Known Allergies   Physical Exam:   Vitals  Blood pressure (!) 153/76, pulse 71, temperature 98.3 F (36.8 C), temperature source Oral, resp. rate 20, height 5\' 4"  (1.626 m), weight 59 kg, SpO2 100 %.  1.  General:  axoxo3  2. Psychiatric: euthymic  3. Neurologic: cn2-12 intact, reflexes 2+ symmetric, diffuse with no clonus, motor 5/5 in all 4 ext,   4. HEENMT:  Anicteric, pupils 1.9mm symmetric, direct, consensual, near intact Neck: no jvd  5. Respiratory : CTAB  6. Cardiovascular : rrr s1, s2, no m/g/r  7. Gastrointestinal:  Abd: soft, nt, nd,  +bs  8. Skin:  Ext: no c/c/e, no rash  9.Musculoskeletal:  Good ROM    Data Review:    CBC Recent Labs  Lab 06/29/19 0005  WBC 6.6  HGB 12.7  HCT 37.1  PLT 357  MCV 90.5  MCH 31.0  MCHC 34.2  RDW 12.8  LYMPHSABS 1.8  MONOABS 0.6  EOSABS 0.1  BASOSABS 0.1   ------------------------------------------------------------------------------------------------------------------  Results for orders placed or performed during the hospital encounter of 06/28/19 (from the past 48 hour(s))  Ethanol     Status: None   Collection Time: 06/29/19 12:05 AM  Result Value Ref Range   Alcohol, Ethyl (B) <10 <10 mg/dL    Comment: (NOTE) Lowest detectable limit for serum alcohol is 10 mg/dL. For medical purposes only. Performed at [facility], [address], [place], [place] [address]   Protime-INR     Status: None   Collection Time: 06/29/19 12:05 AM  Result Value Ref Range   Prothrombin Time 13.2 11.4 - 15.2 seconds   INR 1.0 0.8 - 1.2    Comment: (NOTE) INR goal varies based on device and disease states. Performed at [facility], [address] [place], [place] [address]   APTT     Status: None   Collection Time: 06/29/19 12:05 AM  Result Value Ref Range   aPTT 27 24 - 36 seconds    Comment: Performed at [facility], [address] [place], [place] [address]  CBC     Status: None   Collection Time: 06/29/19 12:05 AM  Result Value Ref Range   WBC 6.6 4.0 - 10.5 K/uL   RBC 4.10 3.87 - 5.11 MIL/uL   Hemoglobin 12.7  12.0 - 15.0 g/dL   HCT 37.1 36.0 - 46.0 %   MCV 90.5 80.0 - 100.0 fL   MCH 31.0 26.0 - 34.0 pg   MCHC 34.2 30.0 - 36.0 g/dL   RDW 12.8 11.5 - 15.5 %   Platelets 357 150 - 400 K/uL   nRBC 0.0 0.0 - 0.2 %    Comment: Performed at [facility], [address] [place], [place] [address]  Differential     Status: None   Collection Time: 06/29/19 12:05 AM  Result Value Ref Range   Neutrophils Relative % 60 %   Neutro Abs 4.0 1.7 - 7.7 K/uL   Lymphocytes  Relative 28 %   Lymphs Abs 1.8 0.7 - 4.0 K/uL   Monocytes Relative 10 %   Monocytes Absolute 0.6 0.1 - 1.0 K/uL   Eosinophils Relative 1 %   Eosinophils Absolute 0.1 0.0 - 0.5 K/uL   Basophils Relative 1 %   Basophils Absolute 0.1 0.0 - 0.1 K/uL   Immature Granulocytes 0 %   Abs Immature Granulocytes 0.01 0.00 - 0.07 K/uL    Comment: Performed at [facility], [address], [place], [place] [address]  Comprehensive metabolic panel     Status: Abnormal   Collection Time: 06/29/19 12:05 AM  Result Value Ref Range   Sodium 131 (L) 135 - 145 mmol/L   Potassium 2.2 (LL) 3.5 - 5.1 mmol/L    Comment: CRITICAL RESULT CALLED TO, READ BACK BY AND VERIFIED WITH: MYRICK,B @ 0109 ON 06/29/19 BY JUW    Chloride 95 (L) 98 - 111 mmol/L   CO2 22 22 - 32 mmol/L   Glucose, Bld 108 (H) 70 - 99 mg/dL   BUN 17 8 - 23 mg/dL   Creatinine, Ser 0.74 0.44 - 1.00 mg/dL   Calcium 9.4 8.9 - 10.3 mg/dL   Total Protein 7.5 6.5 - 8.1 g/dL   Albumin 4.5 3.5 - 5.0 g/dL   AST 27 15 - 41 U/L   ALT 19 0 - 44 U/L   Alkaline Phosphatase 55 38 - 126 U/L   Total Bilirubin 0.7 0.3 - 1.2 mg/dL   GFR calc non Af Amer >60 >60 mL/min   GFR calc Af Amer >60 >60 mL/min   Anion gap 14 5 - 15    Comment: Performed at [facility], [address] [place], [place] [address]  D-dimer, quantitative (not at [facility])     Status: None   Collection Time: 06/29/19 12:05 AM  Result Value Ref Range   D-Dimer, Quant 0.27 0.00 - 0.50 ug/mL-FEU    Comment: (NOTE) At the manufacturer cut-off of 0.50 ug/mL FEU, this assay has been documented to exclude PE with a sensitivity and negative predictive value of 97 to 99%.  At this time, this assay has not been approved by the FDA to exclude DVT/VTE. Results should be correlated with clinical presentation. Performed at [facility], [address] [place],  [address]   Troponin I (High Sensitivity)     Status: None   Collection Time: 06/29/19 12:05 AM  Result Value Ref Range    Troponin I (High Sensitivity) 4 <18 ng/L    Comment: (NOTE) Elevated high sensitivity troponin I (hsTnI) values and significant  changes across serial measurements may suggest ACS but many other  chronic and acute conditions are known to elevate hsTnI results.  Refer to the Links section for chest pain algorithms and additional  guidance. Performed at [facility], [place]  [address], [place], [place] [address]   Brain natriuretic peptide     Status: None   Collection Time: 06/29/19 12:05 AM  Result Value Ref Range   B Natriuretic Peptide 14.0 0.0 - 100.0 pg/mL    Comment: Performed at [facility], [address] [place], [place] [address]  Magnesium     Status: None   Collection Time: 06/29/19 12:05 AM  Result Value Ref Range   Magnesium 2.1 1.7 - 2.4 mg/dL    Comment: Performed at [facility], [address] [place], [place] [address]  Troponin I (High Sensitivity)     Status: None   Collection Time: 06/29/19  1:58 AM  Result Value Ref Range   Troponin I (High Sensitivity) 6 <18 ng/L    Comment: (NOTE) Elevated high sensitivity troponin I (hsTnI) values and significant  changes across serial measurements may suggest ACS but many other  chronic and acute conditions are known to elevate hsTnI results.  Refer to the "Links" section for chest pain algorithms and additional  guidance. Performed at [facility], [address] [place], [place] [address]     Chemistries  Recent Labs  Lab 06/29/19 0005  NA 131*  K 2.2*  CL 95*  CO2 22  GLUCOSE 108*  BUN 17  CREATININE 0.74  CALCIUM 9.4  MG 2.1  AST 27  ALT 19  ALKPHOS 55  BILITOT 0.7   ------------------------------------------------------------------------------------------------------------------  ------------------------------------------------------------------------------------------------------------------ GFR: Estimated Creatinine Clearance: 57.3 mL/min (by C-G formula based on SCr of 0.74 mg/dL). Liver Function  Tests: Recent Labs  Lab 06/29/19 0005  AST 27  ALT 19  ALKPHOS 55  BILITOT 0.7  PROT 7.5  ALBUMIN 4.5   No results for input(s): LIPASE, AMYLASE in the last 168 hours. No results for input(s): AMMONIA in the last 168 hours. Coagulation Profile: Recent Labs  Lab 06/29/19 0005  INR 1.0   Cardiac Enzymes: No results for input(s): CKTOTAL, CKMB, CKMBINDEX, TROPONINI in the last 168 hours. BNP (last 3 results) No results for input(s): PROBNP in the last 8760 hours. HbA1C: No results for input(s): HGBA1C in the last 72 hours. CBG: No results for input(s): GLUCAP in the last 168 hours. Lipid Profile: No results for input(s): CHOL, HDL, LDLCALC, TRIG, CHOLHDL, LDLDIRECT in the last 72 hours. Thyroid Function Tests: No results for input(s): TSH, T4TOTAL, FREET4, T3FREE, THYROIDAB in the last 72 hours. Anemia Panel: No results for input(s): VITAMINB12, FOLATE, FERRITIN, TIBC, IRON, RETICCTPCT in the last 72 hours.  --------------------------------------------------------------------------------------------------------------- Urine analysis:    Component Value Date/Time   COLORURINE STRAW (A) 06/15/2019 1959   APPEARANCEUR CLEAR 06/15/2019 1959   LABSPEC 1.033 (H) 06/15/2019 1959   PHURINE 7.0 06/15/2019 1959   GLUCOSEU NEGATIVE 06/15/2019 1959   HGBUR NEGATIVE 06/15/2019 1959   HGBUR negative 09/09/2009 0841   BILIRUBINUR NEGATIVE 06/15/2019 1959   BILIRUBINUR negative 01/05/2013 [place] 06/15/2019 1959   PROTEINUR NEGATIVE 06/15/2019 1959   UROBILINOGEN 0.2 01/05/2013 1653   UROBILINOGEN 0.2 09/09/2009 0841   NITRITE NEGATIVE 06/15/2019 1959   LEUKOCYTESUR NEGATIVE 06/15/2019 1959      Imaging Results:    Ct Head Wo Contrast  Result Date: 06/29/2019 CLINICAL DATA:  68 year old female with numbness. Recent headache. EXAM: CT HEAD WITHOUT CONTRAST TECHNIQUE: Contiguous axial images were obtained from the base of the skull through the vertex without  intravenous contrast. COMPARISON:  Brain MRI 06/16/2019, head CT 06/15/2019. FINDINGS: Brain: No midline shift, ventriculomegaly, mass effect, evidence of mass lesion, intracranial  hemorrhage or evidence of cortically based acute infarction. Gray-white matter differentiation is within normal limits throughout the brain. Partially empty sella redemonstrated. Vascular: Calcified atherosclerosis at the skull base. No suspicious intracranial vascular hyperdensity. Skull: Negative; a small benign hemangioma was seen to correspond to the 6 millimeter lucent area described on 06/15/2019. Sinuses/Orbits: Visualized paranasal sinuses and mastoids are stable and well pneumatized. Other: Visualized orbits and scalp soft tissues are within normal limits. IMPRESSION: Stable and negative noncontrast CT appearance of the brain. Electronically Signed   By: Genevie Ann M.D.   On: 06/29/2019 00:12   Dg Chest Port 1 View  Result Date: 06/29/2019 CLINICAL DATA:  Shortness of breath. EXAM: PORTABLE CHEST 1 VIEW COMPARISON:  04/09/2016 FINDINGS: The cardiomediastinal contours are normal. The lungs are clear. Pulmonary vasculature is normal. No consolidation, pleural effusion, or pneumothorax. No acute osseous abnormalities are seen. IMPRESSION: No acute chest findings. Electronically Signed   By: Keith Rake M.D.   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 Partially empty sella tursica  Dyspnea/ Chest pain Check cardiac echo Cardiology consult  requested in computer  CAD s/p DES to prox/mid LAD 06/06/2018 Cont aspirin Cont Plavix 75mg  po qday Cont Crestor 40mg  po qday  Hypertension DC Maxide 25/37.5mg  po qday Cont Clonidine 0.2mg  po qhs Start hydralazine 25mg  po tid Check cmp in am  Gerd Cont PPI  Anxiety/ depression/ insomnia Cont Buspar 5mg  po bid Cont Temazepam 7.5mg  po qhs   DVT Prophylaxis-   Lovenox - SCDs   AM Labs Ordered, also please review Full Orders  Family Communication: Admission, patients condition and plan of care including tests being ordered have been discussed with the patient who indicate understanding and agree with the plan and Code Status.  Code Status:  FULL CODE per patient,   Admission status: Observation : Based on patients clinical presentation and evaluation of above clinical data, I have made determination that patient meets observation criteria at this time.   Time spent in minutes : 70   Jani Gravel M.D on 06/29/2019 at 3:36 AM

## 2019-06-29 NOTE — ED Notes (Signed)
Pt hit her call light and I immediately went in room to answer. She had already slid herself to the end of the bed getting up to use bathroom. Ambulated to bathroom without difficulty. Pt given specimen cup but did not obtain sample. Returned to bed and instructed to not get up until help arrived.

## 2019-06-29 NOTE — Progress Notes (Signed)
  Echocardiogram 2D Echocardiogram has been performed.  [name] 06/29/2019, 2:28 PM

## 2019-06-29 NOTE — Progress Notes (Signed)
PROGRESS NOTE  Tanya [name] [id number] DOB: 18-Dec-1950 DOA: 06/28/2019 PCP: [name], MD  Brief History:  68 year old female with a history of hypertension, hyperlipidemia, hypothyroidism, coronary artery disease status post LAD stent 04/24/2019, depression, anxiety presented to the hospital with 3-day history of numbness and tingling to her right face, right upper extremity and leg.  The patient denied any visual disturbance, dysarthria, focal extremity weakness, or facial droop.  In addition, the patient also complained of intermittent chest discomfort for the past week with some associated shortness of breath.  She states that she has substernal chest discomfort that sharp in nature that only last a few seconds but occurs when she is mowing the yard with a push mower as well as working in the garden.  She had denied any nausea, vomiting, diarrhea, abdominal pain, fevers, chills. In the emergency department, the patient was afebrile hemodynamically stable saturating 100% room air.  CBC and LFTs were unremarkable.  Sodium was 131 potassium 2.2 and serum creatinine 0.74.  Chest x-ray was negative.  Troponins were negative.  CT of the brain was negative. The patient was recently discharged from the hospital after a stay from 06/15/2027 to 06/16/2019 during which she had treatment for headaches, nausea, vomiting, and hypokalemia.  Review of the medical record shows there is been some difficulty with compliance according to her primary care provider, Dr. Moshe Cipro.    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  No other concerning findings -no focal neurologic deficits  Sinus bradycardia -Baseline heart rate 50s -Heart rate in the low 40s and nighttime, but patient asymptomatic -Personally reviewed telemetry--no AV blocks, no pauses -May need to consider discontinuation of clonidine  Coronary artery disease -Personally reviewed EKG--sinus bradycardia, nonspecific T wave change -04/24/2019--heart catheterization--40% eccentric in-stent stenosis mid LAD; no other high-grade obstruction in the LAD stent -Continue aspirin and Plavix -Continue statin  Essential hypertension -d/c maxzide -holding clonidine due to soft BP  Hyperlipidemia -Continue statin  Depression/anxiety -Continue BuSpar     Disposition Plan:   Home in 1-2 days  Family Communication:   Spouse updated at bedside 9/28  Consultants:  cardiology  Code Status:  FULL  DVT Prophylaxis: [place] Lovenox   Procedures: As Listed in Progress Note Above  Antibiotics: None    Total time spent 35 minutes.  Greater than 50% spent face to face counseling and coordinating care.    Subjective: Patient states that her numbness on her right side is improved.  She had denies any chest pain, sense of breath, nausea, vomiting, diarrhea.  She complains of a bifrontal headache.  She denies any fevers, chills, visual disturbance.  Objective: Vitals:   06/29/19 0600 06/29/19 0615 06/29/19 0630 06/29/19 0700  BP: 125/65  115/64 126/66  Pulse: (!) 52 (!) 54 (!) 51 (!) 55  Resp: 17 13 12 15   Temp:      TempSrc:      SpO2: 100% 98% 99% 100%  Weight:      Height:        Intake/Output Summary (Last 24 hours) at 06/29/2019 0859 Last data filed at 06/29/2019 0532 Gross per 24 hour  Intake 300 ml  Output --  Net 300 ml   Weight change:  Exam:   General:  Pt is alert, follows commands appropriately, not in acute distress  HEENT: No icterus, No thrush, No neck mass, [place]/AT  Cardiovascular: RRR, S1/S2, no rubs, no gallops  Respiratory: CTA bilaterally, no wheezing, no crackles, no rhonchi  Abdomen: Soft/+BS, non tender, non distended, no guarding  Extremities: No edema, No lymphangitis, No petechiae, No rashes, no synovitis  Neuro:  CN II-XII intact, strength 4/5 in RUE, RLE, strength 4/5 LUE, LLE; sensation intact bilateral; no dysmetria; babinski equivocal     Data Reviewed: I have personally reviewed following labs and imaging studies Basic Metabolic Panel: Recent Labs  Lab 06/29/19 0005  NA 131*  K 2.2*  CL 95*  CO2 22  GLUCOSE 108*  BUN 17  CREATININE 0.74  CALCIUM 9.4  MG 2.1   Liver Function Tests: Recent Labs  Lab 06/29/19 0005  AST 27  ALT 19  ALKPHOS 55  BILITOT 0.7  PROT 7.5  ALBUMIN 4.5   No results for input(s): LIPASE, AMYLASE in the last 168 hours. No results for input(s): AMMONIA in the last 168 hours. Coagulation Profile: Recent Labs  Lab 06/29/19 0005  INR 1.0   CBC: Recent Labs  Lab 06/29/19 0005  WBC 6.6  NEUTROABS 4.0  HGB 12.7  HCT 37.1  MCV 90.5  PLT 357   Cardiac Enzymes: No results for input(s): CKTOTAL, CKMB, CKMBINDEX, TROPONINI in the last 168 hours. BNP: Invalid input(s): POCBNP CBG: No results for input(s): GLUCAP in the last 168 hours. HbA1C: No results for input(s): HGBA1C in the last 72 hours. Urine analysis:    Component Value Date/Time   COLORURINE STRAW (A) 06/15/2019 1959   APPEARANCEUR CLEAR 06/15/2019 1959   LABSPEC 1.033 (H) 06/15/2019 1959   PHURINE 7.0  06/15/2019 1959   GLUCOSEU NEGATIVE 06/15/2019 1959   HGBUR NEGATIVE 06/15/2019 1959   HGBUR negative 09/09/2009 0841   BILIRUBINUR NEGATIVE 06/15/2019 1959   BILIRUBINUR negative 01/05/2013 [place] 06/15/2019 1959   PROTEINUR NEGATIVE 06/15/2019 1959   UROBILINOGEN 0.2 01/05/2013 1653   UROBILINOGEN 0.2 09/09/2009 0841   NITRITE NEGATIVE 06/15/2019 1959   LEUKOCYTESUR NEGATIVE 06/15/2019 1959   Sepsis Labs: @LABRCNTIP (procalcitonin:4,lacticidven:4) )No results found for this or any previous visit (from the past 240 hour(s)).   Scheduled Meds:  aspirin EC  81 mg Oral QHS   busPIRone  5 mg Oral BID   cloNIDine  0.2 mg Oral QHS   clopidogrel  75 mg Oral Daily   enoxaparin (LOVENOX) injection  40 mg Subcutaneous Q24H   hydrALAZINE  25 mg Oral TID   pantoprazole  40 mg Oral Daily   potassium chloride SA  20 mEq Oral Daily   rosuvastatin  40 mg Oral Daily   Continuous Infusions:  sodium chloride      Procedures/Studies: Ct Angio Head W/cm &/or Wo Cm  Result Date: 06/15/2019 CLINICAL DATA:  Headache, acute, normal neuro exam.a EXAM: CT ANGIOGRAPHY HEAD AND NECK TECHNIQUE: Multidetector CT imaging of the head and neck was performed using the standard protocol during bolus administration of intravenous  contrast. Multiplanar CT image reconstructions and MIPs were obtained to evaluate the vascular anatomy. Carotid stenosis measurements (when applicable) are obtained utilizing NASCET criteria, using the distal internal carotid diameter as the denominator. CONTRAST:  124mL OMNIPAQUE IOHEXOL 350 MG/ML SOLN COMPARISON:  Report for brain MRI 07/22/2001 (images unavailable) FINDINGS: CT HEAD FINDINGS Brain: There is no acute intracranial hemorrhage. No demarcated cortical infarction. No evidence of intracranial mass. No midline shift or extra-axial fluid collection. Cerebral volume is normal for age. Partially empty sella turcica. Vascular: Reported separately Skull: No  calvarial fracture. Nonspecific 6 mm well-circumscribed lytic lucency within the right frontal calvarium. Sinuses: Mild ethmoid sinus mucosal thickening. No significant mastoid effusion. Orbits: Imaged globes and orbits demonstrate no acute abnormality. Review of the MIP images confirms the above findings CTA NECK FINDINGS Aortic arch: Standard branching. Imaged portions of the aortic arch demonstrate no aneurysm or evidence of dissection. Mild scattered plaque at the origins of the great vessels of the neck. Right carotid system: The common carotid artery is widely patent to the bifurcation. Calcified and noncalcified plaque at the bifurcation without significant narrowing of the proximal cervical ICA. Distal to the bifurcation, the cervical ICA is widely patent. Left carotid system: The common carotid artery is widely patent to the bifurcation. Calcified and noncalcified plaque at the bifurcation without significant narrowing of the proximal cervical ICA. Distal to the bifurcation, the cervical ICA is widely patent. Vertebral arteries: The bilateral vertebral arteries are patent within the neck to the level of the foramen magnum without significant stenosis. The left vertebral artery is slightly dominant. Skeleton: Reversal of the expected cervical lordosis. Cervical spondylosis. No acute bony abnormality. Other neck: Mild generalized prominence of the thyroid gland. 3 mm left thyroid lobe nodule not meeting consensus criteria for ultrasound follow-up. No cervical lymphadenopathy. Upper chest: No confluent consolidation within the imaged lung apices. Review of the MIP images confirms the above findings CTA HEAD FINDINGS Anterior circulation: Scattered calcified atherosclerotic plaque within the bilateral intracranial internal carotid artery siphons with regions of no more than mild luminal narrowing. The right middle and anterior cerebral arteries are patent without significant proximal stenosis. The left middle  and anterior cerebral arteries are patent without significant proximal stenosis. 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 A 1-2 mm vascular protrusion arising inferiorly from the paraclinoid right internal carotid artery is favored to reflect an infundibulum. Tiny aneurysm not excluded. Posterior circulation: The intracranial vertebral arteries are patent without significant stenosis, as is the basilar artery.The posterior cerebral arteries are patent bilaterally without significant proximal stenosis. Venous sinuses: Within the limitations of contrast timing, no evidence of thrombosis. Anatomic variants: None significant Review of the MIP images confirms the above findings IMPRESSION: CT head: No CT evidence of acute intracranial abnormality. Nonspecific 6 mm well-circumscribed lytic lucency within the right frontal calvarium. CTA head: No intracranial large vessel occlusion or proximal high-grade arterial stenosis. Atherosclerotic calcification of the carotid artery siphons with regions of no more than mild luminal narrowing. 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 A 1-2 mm vascular protrusion arising inferiorly from the paraclinoid right ICA is favored to reflect an infundibulum, with tiny aneurysm not excluded. CTA neck: Mild atherosclerotic plaque within the visualized aortic arch and proximal branch vessels. Calcified and noncalcified plaque also present at the carotid bifurcations bilaterally. The bilateral common carotid, internal carotid and vertebral arteries are patent within the neck without significant  stenosis. Mild generalized prominence of the thyroid gland. Correlate clinically and with relevant laboratory values. 3 mm left thyroid lobe nodule not meeting consensus criteria for ultrasound follow-up. Electronically Signed   By: Kellie Simmering   On: 06/15/2019 17:09   Ct Head Wo  Contrast  Result Date: 06/29/2019 CLINICAL DATA:  68 year old female with numbness. Recent headache. EXAM: CT HEAD WITHOUT CONTRAST TECHNIQUE: Contiguous axial images were obtained from the base of the skull through the vertex without intravenous contrast. COMPARISON:  Brain MRI 06/16/2019, head CT 06/15/2019. FINDINGS: Brain: No midline shift, ventriculomegaly, mass effect, evidence of mass lesion, intracranial hemorrhage or evidence of cortically based acute infarction. Gray-white matter differentiation is within normal limits throughout the brain. Partially empty sella redemonstrated. Vascular: Calcified atherosclerosis at the skull base. No suspicious intracranial vascular hyperdensity. Skull: Negative; a small benign hemangioma was seen to correspond to the 6 millimeter lucent area described on 06/15/2019. Sinuses/Orbits: Visualized paranasal sinuses and mastoids are stable and well pneumatized. Other: Visualized orbits and scalp soft tissues are within normal limits. IMPRESSION: Stable and negative noncontrast CT appearance of the brain. Electronically Signed   By: Genevie Ann M.D.   On: 06/29/2019 00:12   Ct Angio Neck W And/or Wo Contrast  Result Date: 06/15/2019 CLINICAL DATA:  Headache, acute, normal neuro exam.a EXAM: CT ANGIOGRAPHY HEAD AND NECK TECHNIQUE: Multidetector CT imaging of the head and neck was performed using the standard protocol during bolus administration of intravenous contrast. Multiplanar CT image reconstructions and MIPs were obtained to evaluate the vascular anatomy. Carotid stenosis measurements (when applicable) are obtained utilizing NASCET criteria, using the distal internal carotid diameter as the denominator. CONTRAST:  129mL OMNIPAQUE IOHEXOL 350 MG/ML SOLN COMPARISON:  Report for brain MRI 07/22/2001 (images unavailable) FINDINGS: CT HEAD FINDINGS Brain: There is no acute intracranial hemorrhage. No demarcated cortical infarction. No evidence of intracranial mass. No midline  shift or extra-axial fluid collection. Cerebral volume is normal for age. Partially empty sella turcica. Vascular: Reported separately Skull: No calvarial fracture. Nonspecific 6 mm well-circumscribed lytic lucency within the right frontal calvarium. Sinuses: Mild ethmoid sinus mucosal thickening. No significant mastoid effusion. Orbits: Imaged globes and orbits demonstrate no acute abnormality. Review of the MIP images confirms the above findings CTA NECK FINDINGS Aortic arch: Standard branching. Imaged portions of the aortic arch demonstrate no aneurysm or evidence of dissection. Mild scattered plaque at the origins of the great vessels of the neck. Right carotid system: The common carotid artery is widely patent to the bifurcation. Calcified and noncalcified plaque at the bifurcation without significant narrowing of the proximal cervical ICA. Distal to the bifurcation, the cervical ICA is widely patent. Left carotid system: The common carotid artery is widely patent to the bifurcation. Calcified and noncalcified plaque at the bifurcation without significant narrowing of the proximal cervical ICA. Distal to the bifurcation, the cervical ICA is widely patent. Vertebral arteries: The bilateral vertebral arteries are patent within the neck to the level of the foramen magnum without significant stenosis. The left vertebral artery is slightly dominant. Skeleton: Reversal of the expected cervical lordosis. Cervical spondylosis. No acute bony abnormality. Other neck: Mild generalized prominence of the thyroid gland. 3 mm left thyroid lobe nodule not meeting consensus criteria for ultrasound follow-up. No cervical lymphadenopathy. Upper chest: No confluent consolidation within the imaged lung apices. Review of the MIP images confirms the above findings CTA HEAD FINDINGS Anterior circulation: Scattered calcified atherosclerotic plaque within the bilateral intracranial internal carotid artery siphons with regions of no  more  than mild luminal narrowing. The right middle and anterior cerebral arteries are patent without significant proximal stenosis. The left middle and anterior cerebral arteries are patent without significant proximal stenosis. 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 A 1-2 mm vascular protrusion arising inferiorly from the paraclinoid right internal carotid artery is favored to reflect an infundibulum. Tiny aneurysm not excluded. Posterior circulation: The intracranial vertebral arteries are patent without significant stenosis, as is the basilar artery.The posterior cerebral arteries are patent bilaterally without significant proximal stenosis. Venous sinuses: Within the limitations of contrast timing, no evidence of thrombosis. Anatomic variants: None significant Review of the MIP images confirms the above findings IMPRESSION: CT head: No CT evidence of acute intracranial abnormality. Nonspecific 6 mm well-circumscribed lytic lucency within the right frontal calvarium. CTA head: No intracranial large vessel occlusion or proximal high-grade arterial stenosis. Atherosclerotic calcification of the carotid artery siphons with regions of no more than mild luminal narrowing. 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 A 1-2 mm vascular protrusion arising inferiorly from the paraclinoid right ICA is favored to reflect an infundibulum, with tiny aneurysm not excluded. CTA neck: Mild atherosclerotic plaque within the visualized aortic arch and proximal branch vessels. Calcified and noncalcified plaque also present at the carotid bifurcations bilaterally. The bilateral common carotid, internal carotid and vertebral arteries are patent within the neck without significant stenosis. Mild generalized prominence of the thyroid gland. Correlate clinically and with relevant laboratory values. 3 mm left thyroid lobe  nodule not meeting consensus criteria for ultrasound follow-up. Electronically Signed   By: Kellie Simmering   On: 06/15/2019 17:09   Mr Brain W Wo Contrast  Result Date: 06/16/2019 CLINICAL DATA:  Headache. Normal neurological exam. Altered mental status the last 2 days. EXAM: MRI HEAD WITHOUT AND WITH CONTRAST TECHNIQUE: Multiplanar, multiecho pulse sequences of the brain and surrounding structures were obtained without and with intravenous contrast. CONTRAST:  57mL GADAVIST GADOBUTROL 1 MMOL/ML IV SOLN COMPARISON:  CT studies done yesterday. FINDINGS: Brain: The brain has a normal appearance without evidence of malformation, atrophy, old or acute small or large vessel infarction, mass lesion, hemorrhage, hydrocephalus or extra-axial collection. After contrast administration no significant abnormal enhancement occurs. Incidental venous angioma the left superior frontal lobe. Vascular: Major vessels at the base of the brain show flow. Venous sinuses appear patent. Skull and upper cervical spine: Normal. Sinuses/Orbits: Clear/normal. Other: None significant. IMPRESSION: No abnormality seen to explain the clinical presentation. Normal appearance of the brain parenchyma for age. 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 Electronically Signed   By: Nelson Chimes M.D.   On: 06/16/2019 15:12   Dg Chest Port 1 View  Result Date: 06/29/2019 CLINICAL DATA:  Shortness of breath. EXAM: PORTABLE CHEST 1 VIEW COMPARISON:  04/09/2016 FINDINGS: The cardiomediastinal contours are normal. The lungs are clear. Pulmonary vasculature is normal. No consolidation, pleural effusion, or pneumothorax. No acute osseous abnormalities are seen. IMPRESSION: No acute chest findings. Electronically Signed   By: Keith Rake M.D.   On: 06/29/2019 01:21    Orson Eva, DO  [facility] Pager [phone]  If 7PM-7AM, please contact night-coverage [email or web address] Password [email or web address] 06/29/2019, 8:59 AM   LOS: 0 days

## 2019-06-29 NOTE — Care Management Obs Status (Signed)
[place] NOTIFICATION   Patient Details  Name: Tanya [name] MRN: [id number] Date of Birth: 06-Aug-1951   Medicare Observation Status Notification Given:  Yes    Trish Mage, LCSW 06/29/2019, 11:11 AM

## 2019-06-29 NOTE — ED Notes (Signed)
Pt given lunch tray. Denies any needs.

## 2019-06-29 NOTE — ED Notes (Signed)
Pt's husband came to desk stating he would like to speak to the MD about mold exposure. States it is noted there is mold growing under and in his wife's bedroom and he is concerned this could be related to her symptoms.

## 2019-06-30 ENCOUNTER — Encounter (HOSPITAL_COMMUNITY): Payer: Self-pay | Admitting: Student

## 2019-06-30 DIAGNOSIS — G44201 Tension-type headache, unspecified, intractable: Secondary | ICD-10-CM

## 2019-06-30 DIAGNOSIS — I2510 Atherosclerotic heart disease of native coronary artery without angina pectoris: Secondary | ICD-10-CM | POA: Diagnosis not present

## 2019-06-30 DIAGNOSIS — I10 Essential (primary) hypertension: Secondary | ICD-10-CM

## 2019-06-30 DIAGNOSIS — E876 Hypokalemia: Secondary | ICD-10-CM | POA: Diagnosis not present

## 2019-06-30 DIAGNOSIS — R0789 Other chest pain: Secondary | ICD-10-CM

## 2019-06-30 DIAGNOSIS — R209 Unspecified disturbances of skin sensation: Secondary | ICD-10-CM

## 2019-06-30 DIAGNOSIS — R079 Chest pain, unspecified: Secondary | ICD-10-CM | POA: Diagnosis not present

## 2019-06-30 LAB — HEMOGLOBIN A1C
Hgb A1c MFr Bld: 6.1 % — ABNORMAL HIGH (ref 4.8–5.6)
Mean Plasma Glucose: 128.37 mg/dL

## 2019-06-30 LAB — BASIC METABOLIC PANEL
Anion gap: 6 (ref 5–15)
BUN: 11 mg/dL (ref 8–23)
CO2: 24 mmol/L (ref 22–32)
Calcium: 8.9 mg/dL (ref 8.9–10.3)
Chloride: 114 mmol/L — ABNORMAL HIGH (ref 98–111)
Creatinine, Ser: 0.65 mg/dL (ref 0.44–1.00)
GFR calc Af Amer: >60 mL/min (ref >60)
GFR calc non Af Amer: >60 mL/min (ref >60)
Glucose, Bld: 95 mg/dL (ref 70–99)
Potassium: 3.5 mmol/L (ref 3.5–5.1)
Sodium: 144 mmol/L (ref 135–145)

## 2019-06-30 LAB — MAGNESIUM: Magnesium: 2.2 mg/dL (ref 1.7–2.4)

## 2019-06-30 LAB — HIV ANTIBODY (ROUTINE TESTING W REFLEX): HIV Screen 4th Generation wRfx: NONREACTIVE

## 2019-06-30 MED ORDER — AMLODIPINE BESYLATE 5 MG PO TABS
2.5000 mg | ORAL_TABLET | Freq: Every day | ORAL | Status: DC
  Administered 2019-06-30: 11:00:00 2.5 mg via ORAL
  Filled 2019-06-30: qty 1

## 2019-06-30 MED ORDER — AMLODIPINE BESYLATE 2.5 MG PO TABS: 2.5000 mg | ORAL_TABLET | Freq: Every day | ORAL | 1 refills | Status: DC

## 2019-06-30 MED ORDER — POTASSIUM CHLORIDE CRYS ER 20 MEQ PO TBCR: 20.0000 meq | EXTENDED_RELEASE_TABLET | Freq: Every day | ORAL | 0 refills | Status: DC

## 2019-06-30 NOTE — Consult Note (Addendum)
Cardiology Consult    Patient ID: Tanya [name]; [id number]; October 16, 1950   Admit date: 06/28/2019 Date of Consult: 06/30/2019  Primary Care Provider: [name], MD Primary Cardiologist: [name], MD   Patient Profile    Tanya [name] is a 68 y.o. female with past medical history of CAD (s/p DESx2 to proximal LAD in 06/2018 with cath in 04/2019 showing 40% mid-LAD ISR with no significant stenosis along LM, LCx, or RCA), HTN, HLD, and GERD who is being seen today for the evaluation of chest pain at the request of Dr. Carles Collet.   History of Present Illness    [name] was last examined by Pecolia Ades, NP in 04/2019 and reported worsening chest discomfort and dyspnea on exertion which resembled when she required stent placement in the past. Options were reviewed and a repeat cardiac catheterization was arranged. Performed by Dr. Tamala Julian on 04/24/2019 and showed 40% eccentric mid-LAD ISR with no high-grade obstruction. Also noted to have normal LM, LCx, and RCA with normal LV function and EDP.   She presented to [facility] ED on 06/28/2019 for evaluation of chest pain, dyspnea and numbness along her entire body. In talking with the patient today, she reports her dyspnea initially improved following cardiac catheterization but over the past few weeks she has noticed worsening dyspnea on exertion. She is very active at baseline in push-mowing her yard and taking care of chickens. Says she gets short of breath with these activities. Denies any associated exertional chest pain, orthopnea, PND, lower extremity edema, or palpitations. Does describe intermittent right-sided pectoral pain which typically occurs at rest. She is concerned her dyspnea might be secondary to fumes of cleaning products or possibly due to mold in her home (home being checked today per her report).   Reports good compliance with her current medications, including ASA and Plavix. No recent melena or hematochezia.  Does experience easy bruising.   Initial labs showed WBC 6.6, Hgb 12.7, platelets 357, Na+ 131, K+ 2.2, and creatinine 0.74. D-dimer negative. BNP 14. Mg 2.1. COVID negative. FLP showed total cholesterol of 107, HDL 48, and LDL 48. Initial and repeat HS Troponin values negative. CXR with no acute findings. CT Head showed no acute intracranial abnormalities. 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 EKG showed NSR, HR 73, with no acute ST changes when compared to prior tracings.   Was started on K+ supplementation along with PTA Triamterene-HCTZ being discontinued. K+ improved to 3.5 today.    Past Medical History:  Diagnosis Date   Anhedonia    CAD (coronary artery disease)    a. s/p DESx2 to proximal LAD in 06/2018 b. cath in 04/2019 showing 40% mid-LAD ISR with no significant stenosis along LM, LCx, or RCA   Cystitis    Elevated liver enzymes    Fatty liver    GERD (gastroesophageal reflux disease)    Hyperlipidemia    Hypertension    Insomnia    Unspecified hypothyroidism    Vaginitis     Past Surgical History:  Procedure Laterality Date   CATARACT EXTRACTION W/PHACO Right 05/08/2019   Procedure: CATARACT EXTRACTION PHACO AND INTRAOCULAR LENS PLACEMENT ([place]);  Surgeon: Baruch Goldmann, MD;  Location: [facility] ORS;  Service: Ophthalmology;  Laterality: Right;  CDE: 10.02   COLONOSCOPY N/A 02/08/2016   Procedure: COLONOSCOPY;  Surgeon: [name], MD;  Location: [facility] ENDO SUITE;  Service: Endoscopy;  Laterality: N/A;  1200 - moved  to 11:45 - Ann to notify   CORONARY STENT INTERVENTION N/A 06/06/2018   Procedure: CORONARY STENT INTERVENTION;  Surgeon: [name], [name], MD;  Location: [place] CV LAB;  Service: Cardiovascular;  Laterality: N/A;   INTRAVASCULAR PRESSURE WIRE/FFR STUDY N/A 06/06/2018   Procedure: INTRAVASCULAR PRESSURE WIRE/FFR STUDY;  Surgeon: [name], [name], MD;  Location: [place] CV LAB;  Service: Cardiovascular;   Laterality: N/A;   LEFT HEART CATH AND CORONARY ANGIOGRAPHY N/A 04/24/2019   Procedure: LEFT HEART CATH AND CORONARY ANGIOGRAPHY;  Surgeon: Belva Crome, MD;  Location: [place] CV LAB;  Service: Cardiovascular;  Laterality: N/A;   LEFT HEART CATHETERIZATION WITH CORONARY ANGIOGRAM N/A 11/19/2014   Procedure: LEFT HEART CATHETERIZATION WITH CORONARY ANGIOGRAM;  Surgeon: [name], MD;  Location: [facility] CATH LAB;  Service: Cardiovascular;  Laterality: N/A;   RIGHT/LEFT HEART CATH AND CORONARY ANGIOGRAPHY N/A 06/06/2018   Procedure: RIGHT/LEFT HEART CATH AND CORONARY ANGIOGRAPHY;  Surgeon: [name], [name], MD;  Location: [place] CV LAB;  Service: Cardiovascular;  Laterality: N/A;     Home Medications:  Prior to Admission medications   Medication Sig Start Date End Date Taking? Authorizing Provider  acetaminophen (TYLENOL) 500 MG tablet Take 500 mg by mouth every 6 (six) hours as needed for headache.   Yes [provider]  Ascorbic Acid (VITAMIN C) 1000 MG tablet Take 1,000 mg by mouth daily.    Yes [provider]  aspirin EC 81 MG tablet Take 81 mg by mouth at bedtime.   Yes [provider]  busPIRone (BUSPAR) 5 MG tablet TAKE 1 TABLET TWICE A DAY (NEED FOLLOW UP APPOINTMENT WITH PCP) Patient taking differently: Take 5 mg by mouth 2 (two) times a day.  09/08/18  Yes [name], MD  cloNIDine (CATAPRES) 0.2 MG tablet Take one tablet by mouth at bedtime for blood pressure Patient taking differently: Take 0.2 mg by mouth at bedtime.  03/25/19  Yes [name], MD  clopidogrel (PLAVIX) 75 MG tablet Take 1 tablet (75 mg total) by mouth daily. 02/25/19  Yes Herminio Commons, MD  nitroGLYCERIN (NITROSTAT) 0.4 MG SL tablet Place 1 tablet (0.4 mg total) under the tongue every 5 (five) minutes as needed. Patient taking differently: Place 0.4 mg under the tongue every 5 (five) minutes x 3 doses as needed for chest pain.  06/06/18  Yes Reino Bellis B,  NP  pantoprazole (PROTONIX) 40 MG tablet Take 1 tablet (40 mg total) by mouth daily. 02/25/19  Yes Herminio Commons, MD  Polyethyl Glycol-Propyl Glycol (LUBRICANT EYE DROPS) 0.4-0.3 % SOLN Place 1 drop into both eyes 3 (three) times daily as needed (dry/irritated eyes.).   Yes [provider]  potassium chloride SA (K-DUR) 20 MEQ tablet Take 1 tablet (20 mEq total) by mouth daily. 06/17/19  Yes [name], MD  rosuvastatin (CRESTOR) 40 MG tablet Take 1 tablet (40 mg total) by mouth daily. 03/25/19  Yes [name], MD  temazepam (RESTORIL) 15 MG capsule Take 1 capsule (15 mg total) by mouth at bedtime as needed for sleep. 06/23/19  Yes [name], MD  triamterene-hydrochlorothiazide (MAXZIDE-25) 37.5-25 MG tablet Take 1 tablet by mouth daily. 03/25/19  Yes [name], MD    Inpatient Medications: Scheduled Meds:  amLODipine  2.5 mg Oral Daily   aspirin EC  81 mg Oral QHS   busPIRone  5 mg Oral BID   clopidogrel  75 mg Oral Daily  enoxaparin (LOVENOX) injection  40 mg Subcutaneous Q24H   pantoprazole  40 mg Oral Daily   potassium chloride SA  20 mEq Oral Daily   rosuvastatin  40 mg Oral Daily   Continuous Infusions:  PRN Meds: acetaminophen **OR** acetaminophen, polyvinyl alcohol, temazepam  Allergies:   No Known Allergies  Social History:   Social History   Socioeconomic History   Marital status: Married    Spouse name: Not on file   Number of children: Not on file   Years of education: Not on file   Highest education level: Not on file  Occupational History   Not on file  Social Needs   Financial resource strain: Not on file   Food insecurity    Worry: Not on file    Inability: Not on file   Transportation needs    Medical: Not on file    Non-medical: Not on file  Tobacco Use   Smoking status: Never Smoker   Smokeless tobacco: Never Used  Substance and Sexual Activity   Alcohol use: No    Alcohol/week:  0.0 standard drinks   Drug use: No   Sexual activity: Not on file  Lifestyle   Physical activity    Days per week: Not on file    Minutes per session: Not on file   Stress: Not on file  Relationships   Social connections    Talks on phone: Not on file    Gets together: Not on file    Attends religious service: Not on file    Active member of club or organization: Not on file    Attends meetings of clubs or organizations: Not on file    Relationship status: Not on file   Intimate partner violence    Fear of current or ex partner: Not on file    Emotionally abused: Not on file    Physically abused: Not on file    Forced sexual activity: Not on file  Other Topics Concern   Not on file  Social History Narrative   Pt originally from the [place] been in [place] for 20 years       Family History:    Family History  Problem Relation Age of Onset   Dementia Mother    Heart disease Mother        heart attack    Hypertension Mother    Heart attack Father    Heart disease Father        heart attack    Hypertension Father       Review of Systems    General:  No chills, fever, night sweats or weight changes.  Cardiovascular:  No edema, orthopnea, palpitations, paroxysmal nocturnal dyspnea. Positive for chest pain and dyspnea on exertion.  Dermatological: No rash, lesions/masses Respiratory: No cough, dyspnea Urologic: No hematuria, dysuria Abdominal:   No nausea, vomiting, diarrhea, bright red blood per rectum, melena, or hematemesis Neurologic:  No visual changes, wkns, changes in mental status. Positive for numbness.   All other systems reviewed and are otherwise negative except as noted above.  Physical Exam/Data    Vitals:   06/29/19 1700 06/29/19 1742 06/29/19 2139 06/30/19 0521  BP: 128/62 (!) 148/63 (!) 144/69 137/82  Pulse: 64 (!) 57 (!) 58 (!) 55  Resp: 16  16 16   Temp:   98.2 F (36.8 C) 98.4 F (36.9 C)  TempSrc:   Oral Oral  SpO2: 100% 100%  99% 99%  Weight:    59.5 kg  Height:        Intake/Output Summary (Last 24 hours) at 06/30/2019 1041 Last data filed at 06/30/2019 0600 Gross per 24 hour  Intake 1669.06 ml  Output 300 ml  Net 1369.06 ml   Filed Weights   06/28/19 2322 06/30/19 0521  Weight: 59 kg 59.5 kg   Body mass index is 22.52 kg/m.   General: Pleasant female appearing in NAD Psych: Normal affect. Neuro: Alert and oriented X 3. Moves all extremities spontaneously. HEENT: Normal  Neck: Supple without bruits or JVD. Lungs:  Resp regular and unlabored, CTA without wheezing or rales. Heart: RRR no s3, s4, or murmurs. Abdomen: Soft, non-tender, non-distended, BS + x 4.  Extremities: No clubbing, cyanosis or lower extremity edema. DP/PT/Radials 2+ and equal bilaterally.   EKG:  The EKG was personally reviewed and demonstrates: NSR, HR 73, with no acute ST changes when compared to prior tracings.   Telemetry:  Telemetry was personally reviewed and demonstrates: Sinus rhythm, HR in 50's to 60's. No significant arrhythmias.    Labs/Studies     Relevant CV Studies:  Cardiac Catheterization: 04/24/2019  40% eccentric mid LAD in-stent restenosis.  At completion of stent procedure in September 2020, there was residual stent deformity in this region.  There is no high-grade obstruction in the LAD stent.  The LAD is otherwise normal.  Normal left main  Normal circumflex  Normal RCA  Normal LV systolic function and end-diastolic pressure  RECOMMENDATIONS:   Dyspnea cannot be explained by findings on today's study based on absence of significant coronary obstruction and normal LV hemodynamics.  Continue dual antiplatelet therapy beyond 12 months due to overlapping stents in the LAD.   Echocardiogram: 06/29/2019 IMPRESSIONS    1. Left ventricular ejection fraction, by visual estimation, is 55 to 60%. The left ventricle has normal function. There is no left ventricular hypertrophy.  2. Left  ventricular diastolic Doppler parameters are consistent with impaired relaxation pattern of LV diastolic filling.  3. Global right ventricle has normal systolic function.The right ventricular size is normal. No increase in right ventricular wall thickness.  4. Left atrial size was normal.  5. Right atrial size was normal.  6. The mitral valve is normal in structure. Trace mitral valve regurgitation. No evidence of mitral stenosis.  7. The tricuspid valve is normal in structure. Tricuspid valve regurgitation was not visualized by color flow Doppler.  8. The aortic valve is tricuspid Aortic valve regurgitation was not visualized by color flow Doppler. Mild aortic valve sclerosis without stenosis.  9. The pulmonic valve was not well visualized. Pulmonic valve regurgitation is not visualized by color flow Doppler. 10. Normal pulmonary artery systolic pressure. 11. The inferior vena cava IVC is small, suggesting low RA pressure and hypovolemia.   Laboratory Data:  Chemistry Recent Labs  Lab 06/29/19 0005 06/29/19 0946 06/30/19 0456  NA 131* 139 144  K 2.2* 3.2* 3.5  CL 95* 107 114*  CO2 22 23 24   GLUCOSE 108* 109* 95  BUN 17 13 11   CREATININE 0.74 0.66 0.65  CALCIUM 9.4 9.0 8.9  GFRNONAA >60 >60 >60  GFRAA >60 >60 >60  ANIONGAP 14 9.0 6    Recent Labs  Lab 06/29/19 0005 06/29/19 0946  PROT 7.5 7.0  ALBUMIN 4.5 4.1  AST 27 23  ALT 19 17  ALKPHOS 55 48  BILITOT 0.7 0.8   Hematology Recent Labs  Lab 06/29/19 0005 06/29/19 0946  WBC 6.6 4.9  RBC 4.10 4.30  HGB 12.7  13.1  HCT 37.1 39.9  MCV 90.5 92.8  MCH 31.0 30.5  MCHC 34.2 32.8  RDW 12.8 13.2  PLT 357 355   Cardiac EnzymesNo results for input(s): TROPONINI in the last 168 hours. No results for input(s): TROPIPOC in the last 168 hours.  BNP Recent Labs  Lab 06/29/19 0005  BNP 14.0    DDimer  Recent Labs  Lab 06/29/19 0005  DDIMER 0.27    Radiology/Studies:  Ct Head Wo Contrast  Result Date:  06/29/2019 CLINICAL DATA:  68 year old female with numbness. Recent headache. EXAM: CT HEAD WITHOUT CONTRAST TECHNIQUE: Contiguous axial images were obtained from the base of the skull through the vertex without intravenous contrast. COMPARISON:  Brain MRI 06/16/2019, head CT 06/15/2019. FINDINGS: Brain: No midline shift, ventriculomegaly, mass effect, evidence of mass lesion, intracranial hemorrhage or evidence of cortically based acute infarction. Gray-white matter differentiation is within normal limits throughout the brain. Partially empty sella redemonstrated. Vascular: Calcified atherosclerosis at the skull base. No suspicious intracranial vascular hyperdensity. Skull: Negative; a small benign hemangioma was seen to correspond to the 6 millimeter lucent area described on 06/15/2019. Sinuses/Orbits: Visualized paranasal sinuses and mastoids are stable and well pneumatized. Other: Visualized orbits and scalp soft tissues are within normal limits. IMPRESSION: Stable and negative noncontrast CT appearance of the brain. Electronically Signed   By: Genevie Ann M.D.   On: 06/29/2019 00:12   Mr Angio Head Wo Contrast  Result Date: 06/29/2019 CLINICAL DATA:  Numbness or tingling. EXAM: MRI HEAD WITHOUT CONTRAST MRA HEAD WITHOUT CONTRAST TECHNIQUE: Multiplanar, multiecho pulse sequences of the brain and surrounding structures were obtained without intravenous contrast. Angiographic images of the head were obtained using MRA technique without contrast. COMPARISON:  06/16/2019 brain MRI. FINDINGS: MRI HEAD FINDINGS Brain: No acute infarction, hemorrhage, hydrocephalus, extra-axial collection or mass lesion. Vascular: Normal flow voids. 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 Skull and upper cervical spine: Normal marrow signal. Sinuses/Orbits: Negative. MRA HEAD FINDINGS Widely patent carotid and vertebral arteries. The basilar is smooth and widely patent. No Selestino Nila occlusion or  flow limiting stenosis. Inferior outpouching from the supraclinoid left ICA measuring 2 mm. This could reflect aneurysm or infundibulum below the resolution of MRA. IMPRESSION: Brain MRI: No acute finding or interval change.  No explanation for symptoms. Intracranial MRA: 1. No emergent finding. 2. 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 Electronically Signed   By: Monte Fantasia M.D.   On: 06/29/2019 09:47   Mr Brain Wo Contrast  Result Date: 06/29/2019 CLINICAL DATA:  Numbness or tingling. EXAM: MRI HEAD WITHOUT CONTRAST MRA HEAD WITHOUT CONTRAST TECHNIQUE: Multiplanar, multiecho pulse sequences of the brain and surrounding structures were obtained without intravenous contrast. Angiographic images of the head were obtained using MRA technique without contrast. COMPARISON:  06/16/2019 brain MRI. FINDINGS: MRI HEAD FINDINGS Brain: No acute infarction, hemorrhage, hydrocephalus, extra-axial collection or mass lesion. Vascular: Normal flow voids. 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 Skull and upper cervical spine: Normal marrow signal. Sinuses/Orbits: Negative. MRA HEAD FINDINGS Widely patent carotid and vertebral arteries. The basilar is smooth and widely patent. No Jenay Morici occlusion or flow limiting stenosis. Inferior outpouching from the supraclinoid left ICA measuring 2 mm. This could reflect aneurysm or infundibulum below the resolution of MRA. IMPRESSION: Brain MRI: No acute finding or interval change.  No explanation for symptoms. Intracranial MRA: 1. No emergent finding. 2. 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 Electronically Signed  By: Monte Fantasia M.D.   On: 06/29/2019 09:47   Dg Chest Port 1 View  Result Date: 06/29/2019 CLINICAL DATA:  Shortness of breath. EXAM: PORTABLE CHEST 1 VIEW COMPARISON:  04/09/2016 FINDINGS: The cardiomediastinal contours are normal. The lungs are clear. Pulmonary vasculature is  normal. No consolidation, pleural effusion, or pneumothorax. No acute osseous abnormalities are seen. IMPRESSION: No acute chest findings. Electronically Signed   By: Keith Rake M.D.   On: 06/29/2019 01:21     Assessment & Plan    1. Dyspnea on Exertion/ Atypical Chest Pain - reports episodes of dyspnea on exertion occurring for the past several months. Was worse in the days prior to admission and she describes intermittent episodes of right-sided chest discomfort typically occurring at rest.  - recent cath approximately two months ago showed nonobstructive CAD as outlined above. Initial and repeat HS Troponin values negative. D-dimer negative. BNP normal. EKG shows NSR, HR 73, with no acute ST changes when compared to prior tracings. Repeat echo this admission shows a preserved EF of 55-60% with no regional WMA and no significant valve abnormalities.  - would not anticipate further ischemic testing at this time given recent cardiac catheterization. Will add low-dose Amlodipine 2.5mg  daily to assist with BP along with helping with possible coronary vasospasm. If dyspnea does not improve, would consider Pulmonology referral as an outpatient as she does bring up concerns about mold exposure and fumes from cleaning products.   2. CAD - s/p DESx2 to proximal LAD in 06/2018 with recent cath in 04/2019 showing 40% mid-LAD ISR with no significant stenosis along LM, LCx, or RCA as outlined above.  - continue ASA and Plavix (on this for longer than 12 months given over-lapping stent placement) along with statin therapy. Not on BB due to baseline bradycardia.   3. HTN - BP variable at 107/53 - 157/82 within the past 24 hours, at 137/82 on most recent check.  - on Triamterene-HCTZ prior to admission which has now been discontinued given hypokalemia. Will start low-dose Amlodipine 2.5mg  daily to assist with BP and to also help with possible vasospasm as outlined above. Can be further titrated as an  outpatient pending response.   4. HLD - FLP this admission shows total cholesterol of 107, HDL 48, and LDL 48. Continue PTA Crestor 40mg  daily.   5. Numbness/ Hypokalemia - occurring along her right upper and lower extremities. 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 Presentation thought to be secondary to her hypokalemia. K+ 2.2 on admission and improved to 3.5 today.  - further management per admitting team.    For questions or updates, please contact [place] Please consult [email or web address] for contact info under Cardiology/STEMI.  Signed, Erma Heritage, PA-C 06/30/2019, 10:41 AM Pager: [phone]   Attending note Patient seen and discussed with PA Ahmed Prima, I agree with her documentation above. 68 yo female history of CAD with prior DES x 2 to LAD 06/2018 on extended DAPT due to overlapping stents, HTN, HL, GERD admitted with chest pain. Recent cath 04/2019 in setting of chest pain with mild to moderate nonobstructive disease.    WBC 6.6 Hgb 12.7 Plt 357 K 2.2 BUN 17 Ddimer 0.27 BNP 14  hstrop 4-->6-->5.7 COVID neg CXR no acute process EKG SR, no ischemic changes MRI brain no acute process 06/2019 echo LVEF 0000000, grade I diastolic dysfunction, normal RV  With recent cath and no objective evidence of ischemia by EKG or enzymes  would not plan on repeat ischemic testing, symptoms are not typical.  Echo without significant acute findings.  Norvasc started as additional antianginal therapy emperically. Hypokalemia management per primary team, agree with stopping maxide.   [facility] will sign off.   Medication Recommendations:  Continue current regimen Other recommendations (labs, testing, etc):  none Follow up as an outpatient:  n/a   [name] MD

## 2019-06-30 NOTE — Progress Notes (Signed)
Nsg Discharge Note  Admit Date:  06/28/2019 Discharge date: 06/30/2019   Tanya [name] to be D/C'd Home per MD order.  AVS completed.  Copy for chart, and copy for patient signed, and dated. Patient/caregiver able to verbalize understanding.  Discharge Medication: Allergies as of 06/30/2019   No Known Allergies     Medication List    STOP taking these medications   acetaminophen 500 MG tablet Commonly known as: TYLENOL   triamterene-hydrochlorothiazide 37.5-25 MG tablet Commonly known as: MAXZIDE-25     TAKE these medications   amLODipine 2.5 MG tablet Commonly known as: NORVASC Take 1 tablet (2.5 mg total) by mouth daily. Start taking on: July 01, 2019   aspirin EC 81 MG tablet Take 81 mg by mouth at bedtime.   busPIRone 5 MG tablet Commonly known as: BUSPAR TAKE 1 TABLET TWICE A DAY (NEED FOLLOW UP APPOINTMENT WITH PCP) What changed: See the new instructions.   cloNIDine 0.2 MG tablet Commonly known as: CATAPRES Take one tablet by mouth at bedtime for blood pressure What changed:   how much to take  how to take this  when to take this  additional instructions   clopidogrel 75 MG tablet Commonly known as: PLAVIX Take 1 tablet (75 mg total) by mouth daily.   Lubricant Eye Drops 0.4-0.3 % Soln Generic drug: Polyethyl Glycol-Propyl Glycol Place 1 drop into both eyes 3 (three) times daily as needed (dry/irritated eyes.).   nitroGLYCERIN 0.4 MG SL tablet Commonly known as: Nitrostat Place 1 tablet (0.4 mg total) under the tongue every 5 (five) minutes as needed. What changed:   when to take this  reasons to take this   pantoprazole 40 MG tablet Commonly known as: PROTONIX Take 1 tablet (40 mg total) by mouth daily.   potassium chloride SA 20 MEQ tablet Commonly known as: KLOR-CON Take 1 tablet (20 mEq total) by mouth daily.   rosuvastatin 40 MG tablet Commonly known as: Crestor Take 1 tablet (40 mg total) by mouth daily.   temazepam 15  MG capsule Commonly known as: RESTORIL Take 1 capsule (15 mg total) by mouth at bedtime as needed for sleep.   vitamin C 1000 MG tablet Take 1,000 mg by mouth daily.       Discharge Assessment: Vitals:   06/29/19 2139 06/30/19 0521  BP: (!) 144/69 137/82  Pulse: (!) 58 (!) 55  Resp: 16 16  Temp: 98.2 F (36.8 C) 98.4 F (36.9 C)  SpO2: 99% 99%   Skin clean, dry and intact without evidence of skin break down, no evidence of skin tears noted. IV catheter discontinued intact. Site without signs and symptoms of complications - no redness or edema noted at insertion site, patient denies c/o pain - only slight tenderness at site.  Dressing with slight pressure applied.  D/c Instructions-Education: Discharge instructions given to patient/family with verbalized understanding. D/c education completed with patient/family including follow up instructions, medication list, d/c activities limitations if indicated, with other d/c instructions as indicated by MD - patient able to verbalize understanding, all questions fully answered. Patient instructed to return to ED, call 911, or call MD for any changes in condition.  Patient escorted via [place], and D/C home via private auto.  Venita Sheffield, RN 06/30/2019 12:59 PM

## 2019-06-30 NOTE — Plan of Care (Signed)

## 2019-06-30 NOTE — Progress Notes (Signed)
PT Cancellation Note  Patient Details Name: Tanya [name] MRN: [id number] DOB: 31-Dec-1950   Cancelled Treatment:    Reason Eval/Treat Not Completed: PT screened, no needs identified, will sign off.  Patient is independent with functional mobility and ambulation.  Patient to ambulate ad lib for length of stay.   10:23 AM, 06/30/19 Lonell Grandchild, MPT Physical Therapist with [facility] [phone] office [phone] mobile phone

## 2019-06-30 NOTE — Discharge Summary (Signed)
Physician Discharge Summary  Tanya [name] [id number] DOB: 1950/10/28 DOA: 06/28/2019  PCP: [name], MD  Admit date: 06/28/2019 Discharge date: 06/30/2019  Admitted From: Home Disposition:  Home   Recommendations for Outpatient Follow-up:  1. Follow up with PCP in 1-2 weeks 2. Please obtain BMP/CBC in one week    Discharge Condition: Stable CODE STATUS: FULL Diet recommendation: Heart Healthy   Brief/Interim Summary: 68 year old female with a history of hypertension, hyperlipidemia, hypothyroidism, coronary artery disease status post LAD stent (DES x 2) 06/2018, depression, anxiety presented to the hospital with 3-day history of numbness and tingling to her right face, right upper extremity and leg.  The patient denied any visual disturbance, dysarthria, focal extremity weakness, or facial droop.  In addition, the patient also complained of intermittent chest discomfort for the past week with some associated shortness of breath.  She states that she has substernal chest discomfort that sharp in nature that only last a few seconds but occurs when she is mowing the yard with a push mower as well as working in the garden.  She had denied any nausea, vomiting, diarrhea, abdominal pain, fevers, chills. In the emergency department, the patient was afebrile hemodynamically stable saturating 100% room air.  CBC and LFTs were unremarkable.  Sodium was 131 potassium 2.2 and serum creatinine 0.74.  Chest x-ray was negative.  Troponins were negative.  CT of the brain was negative. The patient was recently discharged from the hospital after a stay from 06/15/2027 to 06/16/2019 during which she had treatment for headaches, nausea, vomiting, and hypokalemia.  Review of the medical record shows there is been some difficulty with compliance according to her primary care provider, Dr. Moshe Cipro.  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 -Suspect partly related to hypokalemia -PT evaluation--no followup needed  chest pain -Personally reviewed EKG--sinus rhythm, nonspecific T wave change -Troponins reassuring -Recent cath 04/2019 in setting of chest pain with mild to moderate nonobstructive disease -Echocardiogram -Cardiology consult appreciated--no further ischemic testing -continue DAPT  Hypokalemia -partly for Maxzide and poor compliance with potassium supplement -Repleted appropriately -Check Magnesium--2.2 -d/c home with daily KCl 20 daily x 1 week -stop maxzide -follow up with PCP for labs within one week  Impaired Glucose Tolerance -06/29/19 A1C--6.1 -follow up PCP -lifestyle modification  Hyponatremia -due to hypovolemia -started IVF with KCl -improved with IVF  Intractable headache/Chronic Daily Headahce -Suspect partly analgesic headache -06/16/19 MRI brain--Normal appearance of the brain parenchyma for age. 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 No other concerning findings -no focal neurologic deficits -refer to neurology oupt, Dr. Tomi Likens  Sinus bradycardia -Baseline heart rate 50s -Heart rate in the low 40s and nighttime, but patient asymptomatic -Personally reviewed telemetry--no AV blocks, no pauses -May need to consider discontinuation of clonidine  Coronary artery disease -Personally reviewed EKG--sinus bradycardia, nonspecific T wave change -04/24/2019--heart catheterization--40% eccentric in-stent stenosis mid LAD;no other high-grade obstruction in the LAD stent -Continue aspirin and Plavix -Continue statin  Essential hypertension -d/c maxzide -holding clonidine due to soft BP -start amlodipine  Hyperlipidemia -Continue statin -LDL  48  Depression/anxiety -Continue BuSpar    Discharge Instructions  Discharge Instructions    Ambulatory referral to Neurology   Complete by: As directed    An appointment is  requested in approximately:2-4 weeks Chronic Daily Headache     Allergies as of 06/30/2019   No Known Allergies     Medication List    STOP taking these medications   acetaminophen 500 MG tablet Commonly known as: TYLENOL   triamterene-hydrochlorothiazide 37.5-25 MG tablet Commonly known as: MAXZIDE-25     TAKE these medications   amLODipine 2.5 MG tablet Commonly known as: NORVASC Take 1 tablet (2.5 mg total) by mouth daily. Start taking on: July 01, 2019   aspirin EC 81 MG tablet Take 81 mg by mouth at bedtime.   busPIRone 5 MG tablet Commonly known as: BUSPAR TAKE 1 TABLET TWICE A DAY (NEED FOLLOW UP APPOINTMENT WITH PCP) What changed: See the new instructions.   cloNIDine 0.2 MG tablet Commonly known as: CATAPRES Take one tablet by mouth at bedtime for blood pressure What changed:   how much to take  how to take this  when to take this  additional instructions   clopidogrel 75 MG tablet Commonly known as: PLAVIX Take 1 tablet (75 mg total) by mouth daily.   Lubricant Eye Drops 0.4-0.3 % Soln Generic drug: Polyethyl Glycol-Propyl Glycol Place 1 drop into both eyes 3 (three) times daily as needed (dry/irritated eyes.).   nitroGLYCERIN 0.4 MG SL tablet Commonly known as: Nitrostat Place 1 tablet (0.4 mg total) under the tongue every 5 (five) minutes as needed. What changed:   when to take this  reasons to take this   pantoprazole 40 MG tablet Commonly known as: PROTONIX Take 1 tablet (40 mg total) by mouth daily.   potassium chloride SA 20 MEQ tablet Commonly known as: KLOR-CON Take 1 tablet (20 mEq total) by mouth daily.   rosuvastatin 40 MG tablet Commonly known as: Crestor Take 1 tablet (40 mg total) by mouth daily.   temazepam 15 MG capsule Commonly  known as: RESTORIL Take 1 capsule (15 mg total) by mouth at bedtime as needed for sleep.   vitamin C 1000 MG tablet Take 1,000 mg by mouth daily.      Follow-up Information    Herminio Commons, MD Follow up on 08/13/2019.   Specialty: Cardiology Why: Keep scheduled Cardiology follow-up on 08/13/2019 at 1:20 PM.  Contact information: [place] [address] [phone]          No Known Allergies  Consultations:  cardiology   Procedures/Studies: Ct Angio Head W/cm &/or Wo Cm  Result Date: 06/15/2019 CLINICAL DATA:  Headache, acute, normal neuro exam.a EXAM: CT ANGIOGRAPHY HEAD AND NECK TECHNIQUE: Multidetector CT imaging of the head and neck was performed using the standard protocol during bolus administration of intravenous contrast. Multiplanar CT image reconstructions and MIPs were obtained to evaluate the vascular anatomy. Carotid stenosis measurements (when applicable) are obtained utilizing NASCET criteria, using the distal internal carotid diameter as the denominator. CONTRAST:  136mL OMNIPAQUE IOHEXOL 350 MG/ML SOLN COMPARISON:  Report for brain MRI 07/22/2001 (images unavailable) FINDINGS: CT HEAD FINDINGS Brain: There is no acute intracranial hemorrhage. No demarcated cortical infarction. No evidence of intracranial mass. No midline shift or extra-axial fluid collection. Cerebral volume is normal for age. Partially empty sella turcica. Vascular: Reported separately Skull: No calvarial fracture. Nonspecific 6 mm well-circumscribed lytic lucency within the right frontal calvarium. Sinuses: Mild ethmoid sinus mucosal thickening. No significant mastoid effusion. Orbits: Imaged globes and orbits demonstrate no acute abnormality. Review of the MIP images confirms the above findings CTA NECK FINDINGS Aortic arch: Standard branching. Imaged  portions of the aortic arch demonstrate no aneurysm or evidence of dissection. Mild scattered plaque at the origins of the  great vessels of the neck. Right carotid system: The common carotid artery is widely patent to the bifurcation. Calcified and noncalcified plaque at the bifurcation without significant narrowing of the proximal cervical ICA. Distal to the bifurcation, the cervical ICA is widely patent. Left carotid system: The common carotid artery is widely patent to the bifurcation. Calcified and noncalcified plaque at the bifurcation without significant narrowing of the proximal cervical ICA. Distal to the bifurcation, the cervical ICA is widely patent. Vertebral arteries: The bilateral vertebral arteries are patent within the neck to the level of the foramen magnum without significant stenosis. The left vertebral artery is slightly dominant. Skeleton: Reversal of the expected cervical lordosis. Cervical spondylosis. No acute bony abnormality. Other neck: Mild generalized prominence of the thyroid gland. 3 mm left thyroid lobe nodule not meeting consensus criteria for ultrasound follow-up. No cervical lymphadenopathy. Upper chest: No confluent consolidation within the imaged lung apices. Review of the MIP images confirms the above findings CTA HEAD FINDINGS Anterior circulation: Scattered calcified atherosclerotic plaque within the bilateral intracranial internal carotid artery siphons with regions of no more than mild luminal narrowing. The right middle and anterior cerebral arteries are patent without significant proximal stenosis. The left middle and anterior cerebral arteries are patent without significant proximal stenosis. 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 A 1-2 mm vascular protrusion arising inferiorly from the paraclinoid right internal carotid artery is favored to reflect an infundibulum. Tiny aneurysm not excluded. Posterior circulation: The intracranial vertebral arteries are patent without significant stenosis, as is the  basilar artery.The posterior cerebral arteries are patent bilaterally without significant proximal stenosis. Venous sinuses: Within the limitations of contrast timing, no evidence of thrombosis. Anatomic variants: None significant Review of the MIP images confirms the above findings IMPRESSION: CT head: No CT evidence of acute intracranial abnormality. Nonspecific 6 mm well-circumscribed lytic lucency within the right frontal calvarium. CTA head: No intracranial large vessel occlusion or proximal high-grade arterial stenosis. Atherosclerotic calcification of the carotid artery siphons with regions of no more than mild luminal narrowing. 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 A 1-2 mm vascular protrusion arising inferiorly from the paraclinoid right ICA is favored to reflect an infundibulum, with tiny aneurysm not excluded. CTA neck: Mild atherosclerotic plaque within the visualized aortic arch and proximal branch vessels. Calcified and noncalcified plaque also present at the carotid bifurcations bilaterally. The bilateral common carotid, internal carotid and vertebral arteries are patent within the neck without significant stenosis. Mild generalized prominence of the thyroid gland. Correlate clinically and with relevant laboratory values. 3 mm left thyroid lobe nodule not meeting consensus criteria for ultrasound follow-up. Electronically Signed   By: Kellie Simmering   On: 06/15/2019 17:09   Ct Head Wo Contrast  Result Date: 06/29/2019 CLINICAL DATA:  68 year old female with numbness. Recent headache. EXAM: CT HEAD WITHOUT CONTRAST TECHNIQUE: Contiguous axial images were obtained from the base of the skull through the vertex without intravenous contrast. COMPARISON:  Brain MRI 06/16/2019, head CT 06/15/2019. FINDINGS: Brain: No midline shift, ventriculomegaly, mass effect, evidence of mass lesion, intracranial hemorrhage or evidence of cortically based acute infarction.  Gray-white matter differentiation is within normal limits throughout the brain. Partially empty sella redemonstrated. Vascular: Calcified atherosclerosis at the skull base. No suspicious intracranial vascular hyperdensity. Skull: Negative;  a small benign hemangioma was seen to correspond to the 6 millimeter lucent area described on 06/15/2019. Sinuses/Orbits: Visualized paranasal sinuses and mastoids are stable and well pneumatized. Other: Visualized orbits and scalp soft tissues are within normal limits. IMPRESSION: Stable and negative noncontrast CT appearance of the brain. Electronically Signed   By: Genevie Ann M.D.   On: 06/29/2019 00:12   Ct Angio Neck W And/or Wo Contrast  Result Date: 06/15/2019 CLINICAL DATA:  Headache, acute, normal neuro exam.a EXAM: CT ANGIOGRAPHY HEAD AND NECK TECHNIQUE: Multidetector CT imaging of the head and neck was performed using the standard protocol during bolus administration of intravenous contrast. Multiplanar CT image reconstructions and MIPs were obtained to evaluate the vascular anatomy. Carotid stenosis measurements (when applicable) are obtained utilizing NASCET criteria, using the distal internal carotid diameter as the denominator. CONTRAST:  177mL OMNIPAQUE IOHEXOL 350 MG/ML SOLN COMPARISON:  Report for brain MRI 07/22/2001 (images unavailable) FINDINGS: CT HEAD FINDINGS Brain: There is no acute intracranial hemorrhage. No demarcated cortical infarction. No evidence of intracranial mass. No midline shift or extra-axial fluid collection. Cerebral volume is normal for age. Partially empty sella turcica. Vascular: Reported separately Skull: No calvarial fracture. Nonspecific 6 mm well-circumscribed lytic lucency within the right frontal calvarium. Sinuses: Mild ethmoid sinus mucosal thickening. No significant mastoid effusion. Orbits: Imaged globes and orbits demonstrate no acute abnormality. Review of the MIP images confirms the above findings CTA NECK FINDINGS Aortic  arch: Standard branching. Imaged portions of the aortic arch demonstrate no aneurysm or evidence of dissection. Mild scattered plaque at the origins of the great vessels of the neck. Right carotid system: The common carotid artery is widely patent to the bifurcation. Calcified and noncalcified plaque at the bifurcation without significant narrowing of the proximal cervical ICA. Distal to the bifurcation, the cervical ICA is widely patent. Left carotid system: The common carotid artery is widely patent to the bifurcation. Calcified and noncalcified plaque at the bifurcation without significant narrowing of the proximal cervical ICA. Distal to the bifurcation, the cervical ICA is widely patent. Vertebral arteries: The bilateral vertebral arteries are patent within the neck to the level of the foramen magnum without significant stenosis. The left vertebral artery is slightly dominant. Skeleton: Reversal of the expected cervical lordosis. Cervical spondylosis. No acute bony abnormality. Other neck: Mild generalized prominence of the thyroid gland. 3 mm left thyroid lobe nodule not meeting consensus criteria for ultrasound follow-up. No cervical lymphadenopathy. Upper chest: No confluent consolidation within the imaged lung apices. Review of the MIP images confirms the above findings CTA HEAD FINDINGS Anterior circulation: Scattered calcified atherosclerotic plaque within the bilateral intracranial internal carotid artery siphons with regions of no more than mild luminal narrowing. The right middle and anterior cerebral arteries are patent without significant proximal stenosis. The left middle and anterior cerebral arteries are patent without significant proximal stenosis. 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 A 1-2 mm vascular protrusion arising inferiorly from the paraclinoid right internal carotid artery is favored to reflect an  infundibulum. Tiny aneurysm not excluded. Posterior circulation: The intracranial vertebral arteries are patent without significant stenosis, as is the basilar artery.The posterior cerebral arteries are patent bilaterally without significant proximal stenosis. Venous sinuses: Within the limitations of contrast timing, no evidence of thrombosis. Anatomic variants: None significant Review of the MIP images confirms the above findings IMPRESSION: CT head: No CT evidence of acute intracranial abnormality. Nonspecific 6 mm well-circumscribed  lytic lucency within the right frontal calvarium. CTA head: No intracranial large vessel occlusion or proximal high-grade arterial stenosis. Atherosclerotic calcification of the carotid artery siphons with regions of no more than mild luminal narrowing. 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 A 1-2 mm vascular protrusion arising inferiorly from the paraclinoid right ICA is favored to reflect an infundibulum, with tiny aneurysm not excluded. CTA neck: Mild atherosclerotic plaque within the visualized aortic arch and proximal branch vessels. Calcified and noncalcified plaque also present at the carotid bifurcations bilaterally. The bilateral common carotid, internal carotid and vertebral arteries are patent within the neck without significant stenosis. Mild generalized prominence of the thyroid gland. Correlate clinically and with relevant laboratory values. 3 mm left thyroid lobe nodule not meeting consensus criteria for ultrasound follow-up. Electronically Signed   By: Kellie Simmering   On: 06/15/2019 17:09   Mr Angio Head Wo Contrast  Result Date: 06/29/2019 CLINICAL DATA:  Numbness or tingling. EXAM: MRI HEAD WITHOUT CONTRAST MRA HEAD WITHOUT CONTRAST TECHNIQUE: Multiplanar, multiecho pulse sequences of the brain and surrounding structures were obtained without intravenous contrast. Angiographic images of the head were obtained using MRA  technique without contrast. COMPARISON:  06/16/2019 brain MRI. FINDINGS: MRI HEAD FINDINGS Brain: No acute infarction, hemorrhage, hydrocephalus, extra-axial collection or mass lesion. Vascular: Normal flow voids. 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 Skull and upper cervical spine: Normal marrow signal. Sinuses/Orbits: Negative. MRA HEAD FINDINGS Widely patent carotid and vertebral arteries. The basilar is smooth and widely patent. No branch occlusion or flow limiting stenosis. Inferior outpouching from the supraclinoid left ICA measuring 2 mm. This could reflect aneurysm or infundibulum below the resolution of MRA. IMPRESSION: Brain MRI: No acute finding or interval change.  No explanation for symptoms. Intracranial MRA: 1. No emergent finding. 2. 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 Electronically Signed   By: Monte Fantasia M.D.   On: 06/29/2019 09:47   Mr Brain Wo Contrast  Result Date: 06/29/2019 CLINICAL DATA:  Numbness or tingling. EXAM: MRI HEAD WITHOUT CONTRAST MRA HEAD WITHOUT CONTRAST TECHNIQUE: Multiplanar, multiecho pulse sequences of the brain and surrounding structures were obtained without intravenous contrast. Angiographic images of the head were obtained using MRA technique without contrast. COMPARISON:  06/16/2019 brain MRI. FINDINGS: MRI HEAD FINDINGS Brain: No acute infarction, hemorrhage, hydrocephalus, extra-axial collection or mass lesion. Vascular: Normal flow voids. 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 Skull and upper cervical spine: Normal marrow signal. Sinuses/Orbits: Negative. MRA HEAD FINDINGS Widely patent carotid and vertebral arteries. The basilar is smooth and widely patent. No branch occlusion or flow limiting stenosis. Inferior outpouching from the supraclinoid left ICA measuring 2 mm. This could reflect aneurysm or infundibulum below the resolution of MRA.  IMPRESSION: Brain MRI: No acute finding or interval change.  No explanation for symptoms. Intracranial MRA: 1. No emergent finding. 2. 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 Electronically Signed   By: Monte Fantasia M.D.   On: 06/29/2019 09:47   Mr Jeri Cos [id number] Contrast  Result Date: 06/16/2019 CLINICAL DATA:  Headache. Normal neurological exam. Altered mental status the last 2 days. EXAM: MRI HEAD WITHOUT AND WITH CONTRAST TECHNIQUE: Multiplanar, multiecho pulse sequences of the brain and surrounding structures were obtained without and with intravenous contrast. CONTRAST:  90mL GADAVIST GADOBUTROL 1 MMOL/ML IV SOLN COMPARISON:  CT studies done yesterday. FINDINGS: Brain: The brain has a normal appearance without evidence of  malformation, atrophy, old or acute small or large vessel infarction, mass lesion, hemorrhage, hydrocephalus or extra-axial collection. After contrast administration no significant abnormal enhancement occurs. Incidental venous angioma the left superior frontal lobe. Vascular: Major vessels at the base of the brain show flow. Venous sinuses appear patent. Skull and upper cervical spine: Normal. Sinuses/Orbits: Clear/normal. Other: None significant. IMPRESSION: No abnormality seen to explain the clinical presentation. Normal appearance of the brain parenchyma for age. 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 Electronically Signed   By: Nelson Chimes M.D.   On: 06/16/2019 15:12   Dg Chest Port 1 View  Result Date: 06/29/2019 CLINICAL DATA:  Shortness of breath. EXAM: PORTABLE CHEST 1 VIEW COMPARISON:  04/09/2016 FINDINGS: The cardiomediastinal contours are normal. The lungs are clear. Pulmonary vasculature is normal. No consolidation, pleural effusion, or pneumothorax. No acute osseous abnormalities are seen. IMPRESSION: No acute chest findings. Electronically Signed   By: Keith Rake M.D.   On: 06/29/2019 01:21        Discharge  Exam: Vitals:   06/29/19 2139 06/30/19 0521  BP: (!) 144/69 137/82  Pulse: (!) 58 (!) 55  Resp: 16 16  Temp: 98.2 F (36.8 C) 98.4 F (36.9 C)  SpO2: 99% 99%   Vitals:   06/29/19 1700 06/29/19 1742 06/29/19 2139 06/30/19 0521  BP: 128/62 (!) 148/63 (!) 144/69 137/82  Pulse: 64 (!) 57 (!) 58 (!) 55  Resp: 16  16 16   Temp:   98.2 F (36.8 C) 98.4 F (36.9 C)  TempSrc:   Oral Oral  SpO2: 100% 100% 99% 99%  Weight:    59.5 kg  Height:        General: Pt is alert, awake, not in acute distress Cardiovascular: RRR, S1/S2 +, no rubs, no gallops Respiratory: CTA bilaterally, no wheezing, no rhonchi Abdominal: Soft, NT, ND, bowel sounds + Extremities: no edema, no cyanosis   The results of significant diagnostics from this hospitalization (including imaging, microbiology, ancillary and laboratory) are listed below for reference.    Significant Diagnostic Studies: Ct Angio Head W/cm &/or Wo Cm  Result Date: 06/15/2019 CLINICAL DATA:  Headache, acute, normal neuro exam.a EXAM: CT ANGIOGRAPHY HEAD AND NECK TECHNIQUE: Multidetector CT imaging of the head and neck was performed using the standard protocol during bolus administration of intravenous contrast. Multiplanar CT image reconstructions and MIPs were obtained to evaluate the vascular anatomy. Carotid stenosis measurements (when applicable) are obtained utilizing NASCET criteria, using the distal internal carotid diameter as the denominator. CONTRAST:  160mL OMNIPAQUE IOHEXOL 350 MG/ML SOLN COMPARISON:  Report for brain MRI 07/22/2001 (images unavailable) FINDINGS: CT HEAD FINDINGS Brain: There is no acute intracranial hemorrhage. No demarcated cortical infarction. No evidence of intracranial mass. No midline shift or extra-axial fluid collection. Cerebral volume is normal for age. Partially empty sella turcica. Vascular: Reported separately Skull: No calvarial fracture. Nonspecific 6 mm well-circumscribed lytic lucency within the right  frontal calvarium. Sinuses: Mild ethmoid sinus mucosal thickening. No significant mastoid effusion. Orbits: Imaged globes and orbits demonstrate no acute abnormality. Review of the MIP images confirms the above findings CTA NECK FINDINGS Aortic arch: Standard branching. Imaged portions of the aortic arch demonstrate no aneurysm or evidence of dissection. Mild scattered plaque at the origins of the great vessels of the neck. Right carotid system: The common carotid artery is widely patent to the bifurcation. Calcified and noncalcified plaque at the bifurcation without significant narrowing of the proximal cervical ICA. Distal to the bifurcation, the cervical ICA is  widely patent. Left carotid system: The common carotid artery is widely patent to the bifurcation. Calcified and noncalcified plaque at the bifurcation without significant narrowing of the proximal cervical ICA. Distal to the bifurcation, the cervical ICA is widely patent. Vertebral arteries: The bilateral vertebral arteries are patent within the neck to the level of the foramen magnum without significant stenosis. The left vertebral artery is slightly dominant. Skeleton: Reversal of the expected cervical lordosis. Cervical spondylosis. No acute bony abnormality. Other neck: Mild generalized prominence of the thyroid gland. 3 mm left thyroid lobe nodule not meeting consensus criteria for ultrasound follow-up. No cervical lymphadenopathy. Upper chest: No confluent consolidation within the imaged lung apices. Review of the MIP images confirms the above findings CTA HEAD FINDINGS Anterior circulation: Scattered calcified atherosclerotic plaque within the bilateral intracranial internal carotid artery siphons with regions of no more than mild luminal narrowing. The right middle and anterior cerebral arteries are patent without significant proximal stenosis. The left middle and anterior cerebral arteries are patent without significant proximal stenosis. 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 A 1-2 mm vascular protrusion arising inferiorly from the paraclinoid right internal carotid artery is favored to reflect an infundibulum. Tiny aneurysm not excluded. Posterior circulation: The intracranial vertebral arteries are patent without significant stenosis, as is the basilar artery.The posterior cerebral arteries are patent bilaterally without significant proximal stenosis. Venous sinuses: Within the limitations of contrast timing, no evidence of thrombosis. Anatomic variants: None significant Review of the MIP images confirms the above findings IMPRESSION: CT head: No CT evidence of acute intracranial abnormality. Nonspecific 6 mm well-circumscribed lytic lucency within the right frontal calvarium. CTA head: No intracranial large vessel occlusion or proximal high-grade arterial stenosis. Atherosclerotic calcification of the carotid artery siphons with regions of no more than mild luminal narrowing. 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 A 1-2 mm vascular protrusion arising inferiorly from the paraclinoid right ICA is favored to reflect an infundibulum, with tiny aneurysm not excluded. CTA neck: Mild atherosclerotic plaque within the visualized aortic arch and proximal branch vessels. Calcified and noncalcified plaque also present at the carotid bifurcations bilaterally. The bilateral common carotid, internal carotid and vertebral arteries are patent within the neck without significant stenosis. Mild generalized prominence of the thyroid gland. Correlate clinically and with relevant laboratory values. 3 mm left thyroid lobe nodule not meeting consensus criteria for ultrasound follow-up. Electronically Signed   By: Kellie Simmering   On: 06/15/2019 17:09   Ct Head Wo Contrast  Result Date: 06/29/2019 CLINICAL DATA:  68 year old female with numbness.  Recent headache. EXAM: CT HEAD WITHOUT CONTRAST TECHNIQUE: Contiguous axial images were obtained from the base of the skull through the vertex without intravenous contrast. COMPARISON:  Brain MRI 06/16/2019, head CT 06/15/2019. FINDINGS: Brain: No midline shift, ventriculomegaly, mass effect, evidence of mass lesion, intracranial hemorrhage or evidence of cortically based acute infarction. Gray-white matter differentiation is within normal limits throughout the brain. Partially empty sella redemonstrated. Vascular: Calcified atherosclerosis at the skull base. No suspicious intracranial vascular hyperdensity. Skull: Negative; a small benign hemangioma was seen to correspond to the 6 millimeter lucent area described on 06/15/2019. Sinuses/Orbits: Visualized paranasal sinuses and mastoids are stable and well pneumatized. Other: Visualized orbits and scalp soft tissues are within normal limits. IMPRESSION: Stable and negative noncontrast CT appearance of the brain. Electronically Signed   By: Genevie Ann M.D.   On: 06/29/2019  00:12   Ct Angio Neck W And/or Wo Contrast  Result Date: 06/15/2019 CLINICAL DATA:  Headache, acute, normal neuro exam.a EXAM: CT ANGIOGRAPHY HEAD AND NECK TECHNIQUE: Multidetector CT imaging of the head and neck was performed using the standard protocol during bolus administration of intravenous contrast. Multiplanar CT image reconstructions and MIPs were obtained to evaluate the vascular anatomy. Carotid stenosis measurements (when applicable) are obtained utilizing NASCET criteria, using the distal internal carotid diameter as the denominator. CONTRAST:  161mL OMNIPAQUE IOHEXOL 350 MG/ML SOLN COMPARISON:  Report for brain MRI 07/22/2001 (images unavailable) FINDINGS: CT HEAD FINDINGS Brain: There is no acute intracranial hemorrhage. No demarcated cortical infarction. No evidence of intracranial mass. No midline shift or extra-axial fluid collection. Cerebral volume is normal for age. Partially  empty sella turcica. Vascular: Reported separately Skull: No calvarial fracture. Nonspecific 6 mm well-circumscribed lytic lucency within the right frontal calvarium. Sinuses: Mild ethmoid sinus mucosal thickening. No significant mastoid effusion. Orbits: Imaged globes and orbits demonstrate no acute abnormality. Review of the MIP images confirms the above findings CTA NECK FINDINGS Aortic arch: Standard branching. Imaged portions of the aortic arch demonstrate no aneurysm or evidence of dissection. Mild scattered plaque at the origins of the great vessels of the neck. Right carotid system: The common carotid artery is widely patent to the bifurcation. Calcified and noncalcified plaque at the bifurcation without significant narrowing of the proximal cervical ICA. Distal to the bifurcation, the cervical ICA is widely patent. Left carotid system: The common carotid artery is widely patent to the bifurcation. Calcified and noncalcified plaque at the bifurcation without significant narrowing of the proximal cervical ICA. Distal to the bifurcation, the cervical ICA is widely patent. Vertebral arteries: The bilateral vertebral arteries are patent within the neck to the level of the foramen magnum without significant stenosis. The left vertebral artery is slightly dominant. Skeleton: Reversal of the expected cervical lordosis. Cervical spondylosis. No acute bony abnormality. Other neck: Mild generalized prominence of the thyroid gland. 3 mm left thyroid lobe nodule not meeting consensus criteria for ultrasound follow-up. No cervical lymphadenopathy. Upper chest: No confluent consolidation within the imaged lung apices. Review of the MIP images confirms the above findings CTA HEAD FINDINGS Anterior circulation: Scattered calcified atherosclerotic plaque within the bilateral intracranial internal carotid artery siphons with regions of no more than mild luminal narrowing. The right middle and anterior cerebral arteries are  patent without significant proximal stenosis. The left middle and anterior cerebral arteries are patent without significant proximal stenosis. 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 A 1-2 mm vascular protrusion arising inferiorly from the paraclinoid right internal carotid artery is favored to reflect an infundibulum. Tiny aneurysm not excluded. Posterior circulation: The intracranial vertebral arteries are patent without significant stenosis, as is the basilar artery.The posterior cerebral arteries are patent bilaterally without significant proximal stenosis. Venous sinuses: Within the limitations of contrast timing, no evidence of thrombosis. Anatomic variants: None significant Review of the MIP images confirms the above findings IMPRESSION: CT head: No CT evidence of acute intracranial abnormality. Nonspecific 6 mm well-circumscribed lytic lucency within the right frontal calvarium. CTA head: No intracranial large vessel occlusion or proximal high-grade arterial stenosis. Atherosclerotic calcification of the carotid artery siphons with regions of no more than mild luminal narrowing. 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 A 1-2 mm vascular protrusion arising inferiorly from the paraclinoid right ICA is favored to reflect  an infundibulum, with tiny aneurysm not excluded. CTA neck: Mild atherosclerotic plaque within the visualized aortic arch and proximal branch vessels. Calcified and noncalcified plaque also present at the carotid bifurcations bilaterally. The bilateral common carotid, internal carotid and vertebral arteries are patent within the neck without significant stenosis. Mild generalized prominence of the thyroid gland. Correlate clinically and with relevant laboratory values. 3 mm left thyroid lobe nodule not meeting consensus criteria for ultrasound follow-up. Electronically  Signed   By: Kellie Simmering   On: 06/15/2019 17:09   Mr Angio Head Wo Contrast  Result Date: 06/29/2019 CLINICAL DATA:  Numbness or tingling. EXAM: MRI HEAD WITHOUT CONTRAST MRA HEAD WITHOUT CONTRAST TECHNIQUE: Multiplanar, multiecho pulse sequences of the brain and surrounding structures were obtained without intravenous contrast. Angiographic images of the head were obtained using MRA technique without contrast. COMPARISON:  06/16/2019 brain MRI. FINDINGS: MRI HEAD FINDINGS Brain: No acute infarction, hemorrhage, hydrocephalus, extra-axial collection or mass lesion. Vascular: Normal flow voids. 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 Skull and upper cervical spine: Normal marrow signal. Sinuses/Orbits: Negative. MRA HEAD FINDINGS Widely patent carotid and vertebral arteries. The basilar is smooth and widely patent. No branch occlusion or flow limiting stenosis. Inferior outpouching from the supraclinoid left ICA measuring 2 mm. This could reflect aneurysm or infundibulum below the resolution of MRA. IMPRESSION: Brain MRI: No acute finding or interval change.  No explanation for symptoms. Intracranial MRA: 1. No emergent finding. 2. 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 Electronically Signed   By: Monte Fantasia M.D.   On: 06/29/2019 09:47   Mr Brain Wo Contrast  Result Date: 06/29/2019 CLINICAL DATA:  Numbness or tingling. EXAM: MRI HEAD WITHOUT CONTRAST MRA HEAD WITHOUT CONTRAST TECHNIQUE: Multiplanar, multiecho pulse sequences of the brain and surrounding structures were obtained without intravenous contrast. Angiographic images of the head were obtained using MRA technique without contrast. COMPARISON:  06/16/2019 brain MRI. FINDINGS: MRI HEAD FINDINGS Brain: No acute infarction, hemorrhage, hydrocephalus, extra-axial collection or mass lesion. Vascular: Normal flow voids. Developmental venous anomaly in the left frontal lobe,  uncomplicated/incidental. Skull and upper cervical spine: Normal marrow signal. Sinuses/Orbits: Negative. MRA HEAD FINDINGS Widely patent carotid and vertebral arteries. The basilar is smooth and widely patent. No branch occlusion or flow limiting stenosis. Inferior outpouching from the supraclinoid left ICA measuring 2 mm. This could reflect aneurysm or infundibulum below the resolution of MRA. IMPRESSION: Brain MRI: No acute finding or interval change.  No explanation for symptoms. Intracranial MRA: 1. No emergent finding. 2. 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 Electronically Signed   By: Monte Fantasia M.D.   On: 06/29/2019 09:47   Mr Jeri Cos [id number] Contrast  Result Date: 06/16/2019 CLINICAL DATA:  Headache. Normal neurological exam. Altered mental status the last 2 days. EXAM: MRI HEAD WITHOUT AND WITH CONTRAST TECHNIQUE: Multiplanar, multiecho pulse sequences of the brain and surrounding structures were obtained without and with intravenous contrast. CONTRAST:  34mL GADAVIST GADOBUTROL 1 MMOL/ML IV SOLN COMPARISON:  CT studies done yesterday. FINDINGS: Brain: The brain has a normal appearance without evidence of malformation, atrophy, old or acute small or large vessel infarction, mass lesion, hemorrhage, hydrocephalus or extra-axial collection. After contrast administration no significant abnormal enhancement occurs. Incidental venous angioma the left superior frontal lobe. Vascular: Major vessels at the base of the brain show flow. Venous sinuses appear patent. Skull and upper cervical spine: Normal. Sinuses/Orbits: Clear/normal. Other: None significant. IMPRESSION: No abnormality  seen to explain the clinical presentation. Normal appearance of the brain parenchyma for age. 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 Electronically Signed   By: Nelson Chimes M.D.   On: 06/16/2019 15:12   Dg Chest Port 1 View  Result Date: 06/29/2019 CLINICAL DATA:  Shortness of breath.  EXAM: PORTABLE CHEST 1 VIEW COMPARISON:  04/09/2016 FINDINGS: The cardiomediastinal contours are normal. The lungs are clear. Pulmonary vasculature is normal. No consolidation, pleural effusion, or pneumothorax. No acute osseous abnormalities are seen. IMPRESSION: No acute chest findings. Electronically Signed   By: Keith Rake M.D.   On: 06/29/2019 01:21     Microbiology: Recent Results (from the past 240 hour(s))  SARS CORONAVIRUS 2 (Virdell Hoiland 6-24 HRS) Nasopharyngeal Nasopharyngeal Swab     Status: None   Collection Time: 06/29/19  1:24 AM   Specimen: Nasopharyngeal Swab  Result Value Ref Range Status   SARS Coronavirus 2 NEGATIVE NEGATIVE Final    Comment: (NOTE) SARS-CoV-2 target nucleic acids are NOT DETECTED. The SARS-CoV-2 RNA is generally detectable in upper and lower respiratory specimens during the acute phase of infection. Negative results do not preclude SARS-CoV-2 infection, do not rule out co-infections with other pathogens, and should not be used as the sole basis for treatment or other patient management decisions. Negative results must be combined with clinical observations, patient history, and epidemiological information. The expected result is Negative. Fact Sheet for Patients: SugarRoll.be Fact Sheet for Healthcare Providers: https://www.woods-mathews.com/ This test is not yet approved or cleared by the [place] FDA and  has been authorized for detection and/or diagnosis of SARS-CoV-2 by FDA under an Emergency Use Authorization (EUA). This EUA will remain  in effect (meaning this test can be used) for the duration of the COVID-19 declaration under Section 56 4(b)(1) of the Act, 21 U.S.C. section 360bbb-3(b)(1), unless the authorization is terminated or revoked sooner. Performed at [facility], [place] [address] [place], [place] [address]      Labs: Basic Metabolic Panel: Recent Labs  Lab 06/29/19 0005  06/29/19 0946 06/30/19 0456  NA 131* 139 144  K 2.2* 3.2* 3.5  CL 95* 107 114*  CO2 22 23 24   GLUCOSE 108* 109* 95  BUN 17 13 11   CREATININE 0.74 0.66 0.65  CALCIUM 9.4 9.0 8.9  MG 2.1  --  2.2   Liver Function Tests: Recent Labs  Lab 06/29/19 0005 06/29/19 0946  AST 27 23  ALT 19 17  ALKPHOS 55 48  BILITOT 0.7 0.8  PROT 7.5 7.0  ALBUMIN 4.5 4.1   No results for input(s): LIPASE, AMYLASE in the last 168 hours. No results for input(s): AMMONIA in the last 168 hours. CBC: Recent Labs  Lab 06/29/19 0005 06/29/19 0946  WBC 6.6 4.9  NEUTROABS 4.0  --   HGB 12.7 13.1  HCT 37.1 39.9  MCV 90.5 92.8  PLT 357 355   Cardiac Enzymes: No results for input(s): CKTOTAL, CKMB, CKMBINDEX, TROPONINI in the last 168 hours. BNP: Invalid input(s): POCBNP CBG: No results for input(s): GLUCAP in the last 168 hours.  Time coordinating discharge:  36 minutes  Signed:  Orson Eva, DO [facility] Pager: [phone] 06/30/2019, 1:38 PM

## 2019-07-01 ENCOUNTER — Encounter (HOSPITAL_COMMUNITY): Admission: RE | Admit: 2019-07-01 | Payer: Medicare Other | Source: Ambulatory Visit

## 2019-07-02 ENCOUNTER — Other Ambulatory Visit (HOSPITAL_COMMUNITY): Payer: Managed Care, Other (non HMO)

## 2019-07-03 ENCOUNTER — Telehealth: Payer: Self-pay

## 2019-07-03 ENCOUNTER — Telehealth: Payer: Self-pay | Admitting: Cardiovascular Disease

## 2019-07-03 ENCOUNTER — Encounter (HOSPITAL_COMMUNITY): Admission: RE | Payer: Self-pay | Source: Home / Self Care

## 2019-07-03 ENCOUNTER — Ambulatory Visit (HOSPITAL_COMMUNITY): Admission: RE | Admit: 2019-07-03 | Payer: Medicare Other | Source: Home / Self Care | Admitting: Ophthalmology

## 2019-07-03 SURGERY — PHACOEMULSIFICATION, CATARACT, WITH IOL INSERTION
Anesthesia: Monitor Anesthesia Care | Laterality: Left

## 2019-07-03 NOTE — Telephone Encounter (Signed)
[id number]  Attempted to contact patient to complete [facility] telephone call. No answer. Left message requesting call back. Will try again later. 2nd attempt

## 2019-07-03 NOTE — Telephone Encounter (Signed)
[id number]  Attempted to contact patient to complete [facility] telephone call. No answer. Left message requesting call back. Will try again later. 1st attempt

## 2019-07-03 NOTE — Telephone Encounter (Signed)
Having a lot more SOB than usual

## 2019-07-03 NOTE — Telephone Encounter (Signed)
Left message to return call 

## 2019-07-06 NOTE — Telephone Encounter (Signed)
[id number]  Attempted to contact patient to complete [facility] telephone call. No answer. Left message requesting call back.  3rd attempt

## 2019-07-06 NOTE — Telephone Encounter (Signed)
Patient states she has follow up with her pmd tomorrow.  States everything is fine now.

## 2019-07-07 ENCOUNTER — Encounter: Payer: Self-pay | Admitting: Family Medicine

## 2019-07-07 ENCOUNTER — Other Ambulatory Visit: Payer: Self-pay

## 2019-07-07 ENCOUNTER — Ambulatory Visit (INDEPENDENT_AMBULATORY_CARE_PROVIDER_SITE_OTHER): Payer: Medicare Other | Admitting: Family Medicine

## 2019-07-07 VITALS — BP 140/58 | HR 65 | Temp 98.1°F | Resp 15 | Ht 64.0 in | Wt 134.0 lb

## 2019-07-07 DIAGNOSIS — F419 Anxiety disorder, unspecified: Secondary | ICD-10-CM

## 2019-07-07 DIAGNOSIS — E876 Hypokalemia: Secondary | ICD-10-CM

## 2019-07-07 DIAGNOSIS — I10 Essential (primary) hypertension: Secondary | ICD-10-CM | POA: Diagnosis not present

## 2019-07-07 MED ORDER — ESCITALOPRAM OXALATE 10 MG PO TABS: 10.0000 mg | ORAL_TABLET | Freq: Every day | ORAL | 0 refills | Status: DC

## 2019-07-07 NOTE — Progress Notes (Signed)
Subjective:     Patient ID: Tanya [name], female   DOB: [birth year], 68 y.o.   MRN: [id number]  Tanya [name] presents for follow up from ER History of GERD, hypertension, hyperlipidemia, CAD status post DES to proximal LAD with 40% mild and stenting restenosis on cath in July 2020.  Recent observation for nausea and vomiting, hypokalemia, dyspnea, right-sided chest discomfort.  This was back on September 14.  On September 27 she presented back to the emergency room. With dyspnea, chest discomfort, numbness of the right side, arm, leg for the last 3 days.  Chest x-ray was normal.  CT was normal and stable.  Admitted for dyspnea, chest heaviness, right face and right arm and leg numbness and severe hypokalemia.  Potassium was found to be 2.2 at the time.  Unable to identify dyspnea causes.  K was replenished.  MRI was negative for acute findings.  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  Troponins were reassuring.  EKG was sinus rhythm.  Has a cardiology consult follow-up on Monday, October 12. Maxide was DC'd.  Home with 20 mEq of potassium for a week.  Follow-up with primary care within the week.  Today she reports that she is extremely anxious and worked up.  She does not know what she is supposed to because she is not working anymore.  She continues to have the headache which neurology was set up for post being in the hospital.  Has a cardiology appointment coming up in the next week.  She reports that she just needs to get back to work.  She answers high on the GAD score.  Reports BuSpar is not really do anything for her.  She is finished taking the potassium at this time.  Reports that she has not been taking the Maxide that they stopped.  Reports she has been taking the Norvasc at this time.   Today patient denies signs and symptoms of COVID 19 infection including fever, chills, cough, shortness of breath, and headache.  Past Medical, Surgical, Social History, Allergies,  and Medications have been Reviewed.  Past Medical History:  Diagnosis Date  . Anhedonia   . CAD (coronary artery disease)    a. s/p DESx2 to proximal LAD in 06/2018 b. cath in 04/2019 showing 40% mid-LAD ISR with no significant stenosis along LM, LCx, or RCA  . Cystitis   . Elevated liver enzymes   . Fatty liver   . GERD (gastroesophageal reflux disease)   . Hyperlipidemia   . Hypertension   . Insomnia   . Unspecified hypothyroidism   . Vaginitis    Past Surgical History:  Procedure Laterality Date  . CATARACT EXTRACTION W/PHACO Right 05/08/2019   Procedure: CATARACT EXTRACTION PHACO AND INTRAOCULAR LENS PLACEMENT (IOC);  Surgeon: Baruch Goldmann, MD;  [facility];  Service: Ophthalmology;  Laterality: Right;  CDE: 10.02  . COLONOSCOPY N/A 02/08/2016   Procedure: COLONOSCOPY;  Surgeon: [name], MD;  [facility];  Service: Endoscopy;  Laterality: N/A;  1200 - moved to 11:45 - Ann to notify  . CORONARY STENT INTERVENTION N/A 06/06/2018   Procedure: CORONARY STENT INTERVENTION;  Surgeon: [name], [name], MD;  Location: [place] [facility];  Service: Cardiovascular;  Laterality: N/A;  . INTRAVASCULAR PRESSURE WIRE/FFR STUDY N/A 06/06/2018   Procedure: INTRAVASCULAR PRESSURE WIRE/FFR STUDY;  Surgeon: [name], [name], MD;  Location: [place] [facility];  Service: Cardiovascular;  Laterality: N/A;  . LEFT HEART  CATH AND CORONARY ANGIOGRAPHY N/A 04/24/2019   Procedure: LEFT HEART CATH AND CORONARY ANGIOGRAPHY;  Surgeon: Belva Crome, MD;  Location: [place] [facility];  Service: Cardiovascular;  Laterality: N/A;  . LEFT HEART CATHETERIZATION WITH CORONARY ANGIOGRAM N/A 11/19/2014   Procedure: LEFT HEART CATHETERIZATION WITH CORONARY ANGIOGRAM;  Surgeon: [name], MD;  Location: [facility];  Service: Cardiovascular;  Laterality: N/A;  . RIGHT/LEFT HEART CATH AND CORONARY ANGIOGRAPHY N/A 06/06/2018   Procedure: RIGHT/LEFT HEART CATH AND CORONARY ANGIOGRAPHY;  Surgeon:  [name], [name], MD;  Location: [facility] [place] [facility];  Service: Cardiovascular;  Laterality: N/A;   Social History   Socioeconomic History  . Marital status: Married    Spouse name: Not on file  . Number of children: Not on file  . Years of education: Not on file  . Highest education level: Not on file  Occupational History  . Not on file  Social Needs  . Financial resource strain: Not on file  . Food insecurity    Worry: Not on file    Inability: Not on file  . Transportation needs    Medical: Not on file    Non-medical: Not on file  Tobacco Use  . Smoking status: Never Smoker  . Smokeless tobacco: Never Used  Substance and Sexual Activity  . Alcohol use: No    Alcohol/week: 0.0 standard drinks  . Drug use: No  . Sexual activity: Not on file  Lifestyle  . Physical activity    Days per week: Not on file    Minutes per session: Not on file  . Stress: Not on file  Relationships  . Social Herbalist on phone: Not on file    Gets together: Not on file    Attends religious service: Not on file    Active member of club or organization: Not on file    Attends meetings of clubs or organizations: Not on file    Relationship status: Not on file  . Intimate partner violence    Fear of current or ex partner: Not on file    Emotionally abused: Not on file    Physically abused: Not on file    Forced sexual activity: Not on file  Other Topics Concern  . Not on file  Social History Narrative   Pt originally from the [place] been in [place] for 20 years      Outpatient Encounter Medications as of 07/07/2019  Medication Sig  . amLODipine (NORVASC) 2.5 MG tablet Take 1 tablet (2.5 mg total) by mouth daily.  . Ascorbic Acid (VITAMIN C) 1000 MG tablet Take 1,000 mg by mouth daily.   Marland Kitchen aspirin EC 81 MG tablet Take 81 mg by mouth at bedtime.  . busPIRone (BUSPAR) 5 MG tablet TAKE 1 TABLET TWICE A DAY (NEED FOLLOW UP APPOINTMENT WITH PCP) (Patient taking differently: Take 5 mg  by mouth 2 (two) times a day. )  . cloNIDine (CATAPRES) 0.2 MG tablet Take one tablet by mouth at bedtime for blood pressure (Patient taking differently: Take 0.2 mg by mouth at bedtime. )  . clopidogrel (PLAVIX) 75 MG tablet Take 1 tablet (75 mg total) by mouth daily.  . nitroGLYCERIN (NITROSTAT) 0.4 MG SL tablet Place 1 tablet (0.4 mg total) under the tongue every 5 (five) minutes as needed. (Patient taking differently: Place 0.4 mg under the tongue every 5 (five) minutes x 3 doses as needed for chest pain. )  . pantoprazole (PROTONIX)  40 MG tablet Take 1 tablet (40 mg total) by mouth daily.  Vladimir Faster Glycol-Propyl Glycol (LUBRICANT EYE DROPS) 0.4-0.3 % SOLN Place 1 drop into both eyes 3 (three) times daily as needed (dry/irritated eyes.).  Marland Kitchen potassium chloride SA (K-DUR) 20 MEQ tablet Take 1 tablet (20 mEq total) by mouth daily.  . rosuvastatin (CRESTOR) 40 MG tablet Take 1 tablet (40 mg total) by mouth daily.  . temazepam (RESTORIL) 15 MG capsule Take 1 capsule (15 mg total) by mouth at bedtime as needed for sleep.  Marland Kitchen escitalopram (LEXAPRO) 10 MG tablet Take 1 tablet (10 mg total) by mouth daily.   No facility-administered encounter medications on file as of 07/07/2019.    No Known Allergies  Review of Systems  Constitutional: Negative for chills and fever.  HENT: Negative.   Eyes: Negative.   Respiratory: Negative.        Dyspnea per her  Cardiovascular: Negative.   Gastrointestinal: Negative.   Endocrine: Negative.   Genitourinary: Negative.   Musculoskeletal: Negative.   Skin: Negative.   Allergic/Immunologic: Negative.   Neurological: Positive for headaches.  Hematological: Negative.   Psychiatric/Behavioral: The patient is nervous/anxious.   All other systems reviewed and are negative.      Objective:     BP (!) 140/58   Pulse 65   Temp 98.1 F (36.7 C) (Oral)   Resp 15   Ht 5\' 4"  (1.626 m)   Wt 134 lb 0.6 oz (60.8 kg)   SpO2 96%   BMI 23.01 kg/m   Physical  Exam Vitals signs and nursing note reviewed.  Constitutional:      Appearance: Normal appearance. She is well-developed, well-groomed and normal weight.  HENT:     Head: Normocephalic and atraumatic.     Right Ear: External ear normal.     Left Ear: External ear normal.     Nose: Nose normal.     Mouth/Throat:     Mouth: Mucous membranes are moist.     Pharynx: Oropharynx is clear.  Eyes:     General:        Right eye: No discharge.        Left eye: No discharge.     Conjunctiva/sclera: Conjunctivae normal.  Neck:     Musculoskeletal: Normal range of motion and neck supple.  Cardiovascular:     Rate and Rhythm: Normal rate and regular rhythm.     Pulses: Normal pulses.     Heart sounds: Normal heart sounds.  Pulmonary:     Effort: Pulmonary effort is normal.     Breath sounds: Normal breath sounds.  Musculoskeletal: Normal range of motion.  Skin:    General: Skin is warm.  Neurological:     General: No focal deficit present.     Mental Status: She is alert and oriented to person, place, and time.     Cranial Nerves: Cranial nerves are intact.     Motor: Motor function is intact.     Coordination: Coordination is intact.     Gait: Gait is intact.  Psychiatric:        Attention and Perception: Attention normal.        Mood and Affect: Mood is anxious. Affect is tearful.        Speech: Speech normal.        Behavior: Behavior is hyperactive. Behavior is cooperative.        Thought Content: Thought content normal.        Cognition and  Memory: Cognition normal.        Judgment: Judgment normal.    GAD 7 : Generalized Anxiety Score 07/07/2019 03/06/2018  Nervous, Anxious, on Edge 3 3  Control/stop worrying 3 3  Worry too much - different things 3 3  Trouble relaxing 1 3  Restless 2 3  Easily annoyed or irritable 2 2  Afraid - awful might happen 0 3  Total GAD 7 Score 14 20  Anxiety Difficulty Very difficult Somewhat difficult    Depression screen [facility] 2/9 07/07/2019  03/25/2019 08/12/2018  Decreased Interest 0 3 2  Down, Depressed, Hopeless 3 3 1   PHQ - 2 Score 3 6 3   Altered sleeping 3 3 3   Tired, decreased energy 0 2 2  Change in appetite 3 - 3  Feeling bad or failure about yourself  1 3 2   Trouble concentrating 1 2 3   Moving slowly or fidgety/restless 0 3 1  Suicidal thoughts 0 0 0  PHQ-9 Score 11 19 17   Difficult doing work/chores - Somewhat difficult Not difficult at all       Assessment and Plan       1. Anxiety Anxiety is rather high today GAD of 14.  In addition PHQ 9 was elevated but better than it was previous.  Very upset that she is lost her job recently.  This appears to be playing a role in some of her symptoms as well as panic-like attacks.  Try to counsel on alternatives including walking, developing hobbies, medication she did not really appear to be receptive to any of these.  Was receptive to wanting to be on medication to help with it.  We will try Lexapro at this time.  Following up in 2 weeks to see if she is feeling any better at that time.  - escitalopram (LEXAPRO) 10 MG tablet; Take 1 tablet (10 mg total) by mouth daily.  Dispense: 30 tablet; Refill: 0  2. Hypokalemia we will be rechecking Monday, October 12 this will be a week after she is completed taking all of the potassium.  And she has stayed off of the [facility].  Hopefully her potassium will stay.  If not we will consider starting a 10 mEq potassium supplement.  Also has an appointment with the cardiologist next week.  Appreciate their input if they feel that she should be on a potassium supplement at that time.  - COMPLETE METABOLIC PANEL WITH GFR  3. Essential hypertension Not well controlled.  There was question as to whether or not she was compliant.  Change from Maxide to 2.5 of Norvasc.  Is also on clonidine at this time.  Not currently taking any more potassium.  We will be rechecking panel next Monday to see if she is able to hold her potassium a week after  stopping it.  Cardiology appointment coming up.  Appreciate collaboration in her care please let PCP know if they need anything        Follow-up: 07/21/2019  Perlie Mayo, DNP, AGNP-BC [place], [place],  [id number] Office Hours: Mon-Thurs 8 am-5 pm; Fri 8 am-12 pm Office Phone:  [phone]  Office Fax: [phone]

## 2019-07-07 NOTE — Patient Instructions (Signed)
    I appreciate the opportunity to provide you with the care for your health and wellness.  Today we discussed: blood pressure medication change, and anxiety  Follow up: 2 weeks on phone for anxiety   Lab work Monday to check potassium  Please continue to amlodipine 2.5 mg. We will check your potassium next week if they are stable, you will no longer need to take potassium.  Keep appt with your cardiologist.   Be patient with yourself, you are going through lot.  Continue to walk daily where you enjoy. Work on meditation daily to help relax.   Please continue to practice social distancing to keep you, your family, and our community safe.  If you must go out, please wear a mask and practice good handwashing.  GET FRESH AIR DAILY. STAY HYDRATED WITH WATER.   It was a pleasure to see you and I look forward to continuing to work together on your health and well-being. Please do not hesitate to call the office if you need care or have questions about your care.  Have a wonderful day and week. With Gratitude, Cherly Beach, DNP, AGNP-BC

## 2019-07-14 LAB — COMPLETE METABOLIC PANEL WITH GFR
AG Ratio: 1.6 (calc) (ref 1.0–2.5)
ALT: 14 U/L (ref 6–29)
AST: 21 U/L (ref 10–35)
Albumin: 4.4 g/dL (ref 3.6–5.1)
Alkaline phosphatase (APISO): 51 U/L (ref 37–153)
BUN: 14 mg/dL (ref 7–25)
CO2: 24 mmol/L (ref 20–32)
Calcium: 9.7 mg/dL (ref 8.6–10.4)
Chloride: 107 mmol/L (ref 98–110)
Creat: 0.96 mg/dL (ref 0.50–0.99)
GFR, Est African American: 70 mL/min/{1.73_m2} (ref >=60)
GFR, Est Non African American: 60 mL/min/{1.73_m2} (ref >=60)
Globulin: 2.8 g/dL (calc) (ref 1.9–3.7)
Glucose, Bld: 204 mg/dL — ABNORMAL HIGH (ref 65–139)
Potassium: 4.2 mmol/L (ref 3.5–5.3)
Sodium: 142 mmol/L (ref 135–146)
Total Bilirubin: 0.4 mg/dL (ref 0.2–1.2)
Total Protein: 7.2 g/dL (ref 6.1–8.1)

## 2019-07-15 NOTE — Progress Notes (Signed)
Please call and let Tanya [name] know that her potassium was in range on last labs, hopefully it will stay that way. At this time I do not think she needs a supplement of potassium. Reminder her of her next appt.

## 2019-07-20 ENCOUNTER — Telehealth: Payer: Self-pay | Admitting: *Deleted

## 2019-07-20 ENCOUNTER — Other Ambulatory Visit: Payer: Self-pay

## 2019-07-20 ENCOUNTER — Telehealth (HOSPITAL_COMMUNITY): Payer: Self-pay

## 2019-07-20 MED ORDER — CLONIDINE HCL 0.2 MG PO TABS: ORAL_TABLET | ORAL | 1 refills | Status: DC

## 2019-07-20 NOTE — Telephone Encounter (Signed)
Called to schedule angio, no answer, left vm. AW  

## 2019-07-20 NOTE — Telephone Encounter (Signed)
Pt needs refill on her clonidine 0.2 mg sent to [facility] in [place] 

## 2019-07-20 NOTE — Telephone Encounter (Signed)
Medication refilled and sent in.

## 2019-07-21 ENCOUNTER — Encounter: Payer: Self-pay | Admitting: Family Medicine

## 2019-07-21 ENCOUNTER — Other Ambulatory Visit: Payer: Self-pay

## 2019-07-21 ENCOUNTER — Ambulatory Visit (INDEPENDENT_AMBULATORY_CARE_PROVIDER_SITE_OTHER): Payer: Medicare Other | Admitting: Family Medicine

## 2019-07-21 DIAGNOSIS — F419 Anxiety disorder, unspecified: Secondary | ICD-10-CM | POA: Diagnosis not present

## 2019-07-21 DIAGNOSIS — F5101 Primary insomnia: Secondary | ICD-10-CM | POA: Diagnosis not present

## 2019-07-21 NOTE — Progress Notes (Signed)
Virtual Visit via Telephone Note   This visit type was conducted due to national recommendations for restrictions regarding the COVID-19 Pandemic (e.g. social distancing) in an effort to limit this patient's exposure and mitigate transmission in our community.  Due to her co-morbid illnesses, this patient is at least at moderate risk for complications without adequate follow up.  This format is felt to be most appropriate for this patient at this time.  The patient did not have access to video technology/had technical difficulties with video requiring transitioning to audio format only (telephone).  All issues noted in this document were discussed and addressed.  No physical exam could be performed with this format.   Evaluation Performed:  Follow-up visit  Date:  07/21/2019   ID:  Tanya [name], Tanya [name] [birth year], MRN [id number]  Patient Location: Home Provider Location: Office  Location of Patient: Home Location of Provider: Telehealth Consent was obtain for visit to be over via telehealth. I verified that I am speaking with the correct person using two identifiers.  PCP:  [name], MD   Chief Complaint:  Anxiety follow up   History of Present Illness:    Tanya [name] is a 68 y.o. female with history of GERD, hypertension, hyperlipidemia, anxiety among others.  Reported to her office visit back on October 6 she had a lot of anxiety.  She was started on Lexapro at that time reported that BuSpar was not helping her.  She reports that she stopped taking Lexapro because it made her have shakiness.  She reports she no longer has anxiety because she was able to find a job.  Which was causing most of her anxiety when we spoke 2 weeks ago.  The patient does not have symptoms concerning for COVID-19 infection (fever, chills, cough, or new shortness of breath).   Past Medical, Surgical, Social History, Allergies, and Medications have been Reviewed.  Past Medical History:   Diagnosis Date  . Anhedonia   . CAD (coronary artery disease)    a. s/p DESx2 to proximal LAD in 06/2018 b. cath in 04/2019 showing 40% mid-LAD ISR with no significant stenosis along LM, LCx, or RCA  . Cystitis   . Elevated liver enzymes   . Fatty liver   . GERD (gastroesophageal reflux disease)   . Hyperlipidemia   . Hypertension   . Insomnia   . Unspecified hypothyroidism   . Vaginitis    Past Surgical History:  Procedure Laterality Date  . CATARACT EXTRACTION W/PHACO Right 05/08/2019   Procedure: CATARACT EXTRACTION PHACO AND INTRAOCULAR LENS PLACEMENT (IOC);  Surgeon: Baruch Goldmann, MD;  [facility];  Service: Ophthalmology;  Laterality: Right;  CDE: 10.02  . COLONOSCOPY N/A 02/08/2016   Procedure: COLONOSCOPY;  Surgeon: [name], MD;  [facility];  Service: Endoscopy;  Laterality: N/A;  1200 - moved to 11:45 - Ann to notify  . CORONARY STENT INTERVENTION N/A 06/06/2018   Procedure: CORONARY STENT INTERVENTION;  Surgeon: [name], [name], MD;  Location: [place] [facility];  Service: Cardiovascular;  Laterality: N/A;  . INTRAVASCULAR PRESSURE WIRE/FFR STUDY N/A 06/06/2018   Procedure: INTRAVASCULAR PRESSURE WIRE/FFR STUDY;  Surgeon: [name], [name], MD;  Location: [place] [facility];  Service: Cardiovascular;  Laterality: N/A;  . LEFT HEART CATH AND CORONARY ANGIOGRAPHY N/A 04/24/2019   Procedure: LEFT HEART CATH AND CORONARY ANGIOGRAPHY;  Surgeon: Belva Crome, MD;  Location: [place] [facility];  Service: Cardiovascular;  Laterality:  N/A;  . LEFT HEART CATHETERIZATION WITH CORONARY ANGIOGRAM N/A 11/19/2014   Procedure: LEFT HEART CATHETERIZATION WITH CORONARY ANGIOGRAM;  Surgeon: [name], MD;  Location: [facility];  Service: Cardiovascular;  Laterality: N/A;  . RIGHT/LEFT HEART CATH AND CORONARY ANGIOGRAPHY N/A 06/06/2018   Procedure: RIGHT/LEFT HEART CATH AND CORONARY ANGIOGRAPHY;  Surgeon: [name], [name], MD;  Location: [place] [facility];  Service:  Cardiovascular;  Laterality: N/A;     Current Meds  Medication Sig  . amLODipine (NORVASC) 2.5 MG tablet Take 1 tablet (2.5 mg total) by mouth daily.  . Ascorbic Acid (VITAMIN C) 1000 MG tablet Take 1,000 mg by mouth daily.   Marland Kitchen aspirin EC 81 MG tablet Take 81 mg by mouth at bedtime.  . busPIRone (BUSPAR) 5 MG tablet TAKE 1 TABLET TWICE A DAY (NEED FOLLOW UP APPOINTMENT WITH PCP)  . cloNIDine (CATAPRES) 0.2 MG tablet Take one tablet by mouth at bedtime for blood pressure  . clopidogrel (PLAVIX) 75 MG tablet Take 1 tablet (75 mg total) by mouth daily.  . nitroGLYCERIN (NITROSTAT) 0.4 MG SL tablet Place 1 tablet (0.4 mg total) under the tongue every 5 (five) minutes as needed. (Patient taking differently: Place 0.4 mg under the tongue every 5 (five) minutes x 3 doses as needed for chest pain. )  . pantoprazole (PROTONIX) 40 MG tablet Take 1 tablet (40 mg total) by mouth daily.  Vladimir Faster Glycol-Propyl Glycol (LUBRICANT EYE DROPS) 0.4-0.3 % SOLN Place 1 drop into both eyes 3 (three) times daily as needed (dry/irritated eyes.).  Marland Kitchen potassium chloride SA (K-DUR) 20 MEQ tablet Take 1 tablet (20 mEq total) by mouth daily.  . rosuvastatin (CRESTOR) 40 MG tablet Take 1 tablet (40 mg total) by mouth daily.  . temazepam (RESTORIL) 15 MG capsule Take 1 capsule (15 mg total) by mouth at bedtime as needed for sleep.     Allergies:   Patient has no known allergies.   Social History   Tobacco Use  . Smoking status: Never Smoker  . Smokeless tobacco: Never Used  Substance Use Topics  . Alcohol use: No    Alcohol/week: 0.0 standard drinks  . Drug use: No     Family Hx: The patient's family history includes Dementia in her mother; Heart attack in her father; Heart disease in her father and mother; Hypertension in her father and mother.  ROS:   Please see the history of present illness.    All other systems reviewed and are negative.   Labs/Other Tests and Data Reviewed:    Recent Labs:  02/24/2019: TSH 1.23 06/29/2019: B Natriuretic Peptide 14.0; Hemoglobin 13.1; Platelets 355 06/30/2019: Magnesium 2.2 07/13/2019: ALT 14; BUN 14; Creat 0.96; Potassium 4.2; Sodium 142   Recent Lipid Panel Lab Results  Component Value Date/Time   CHOL 107 06/29/2019 09:46 AM   TRIG 56 06/29/2019 09:46 AM   HDL 48 06/29/2019 09:46 AM   CHOLHDL 2.2 06/29/2019 09:46 AM   LDLCALC 48 06/29/2019 09:46 AM   LDLCALC 80 02/24/2019 09:58 AM    Wt Readings from Last 3 Encounters:  07/07/19 134 lb 0.6 oz (60.8 kg)  06/30/19 131 lb 2.8 oz (59.5 kg)  06/17/19 138 lb (62.6 kg)     Objective:    Vital Signs:  There were no vitals taken for this visit.   GEN:  Alert and oriented RESPIRATORY:  No shortness of breath noted in conversation PSYCH:  Normal affect and mood   GAD 7 : Generalized  Anxiety Score 07/21/2019 07/07/2019 03/06/2018  Nervous, Anxious, on Edge 0 3 3  Control/stop worrying 0 3 3  Worry too much - different things 0 3 3  Trouble relaxing 0 1 3  Restless 0 2 3  Easily annoyed or irritable 0 2 2  Afraid - awful might happen 0 0 3  Total GAD 7 Score 0 14 20  Anxiety Difficulty Not difficult at all Very difficult Somewhat difficult      ASSESSMENT & PLAN:    1. Anxiety Resolved, declines taking any more BuSpar Lexapro at this time.  2. Primary insomnia Continues Restoril is not having any issues with sleeping at this time.    Time:   Today, I have spent 7 minutes with the patient with telehealth technology discussing the above problems.     Medication Adjustments/Labs and Tests Ordered: Current medicines are reviewed at length with the patient today.  Concerns regarding medicines are outlined above.   Tests Ordered: No orders of the defined types were placed in this encounter.   Medication Changes: No orders of the defined types were placed in this encounter.   Disposition:  Follow up 07/29/2019   Signed, Perlie Mayo, NP  07/21/2019 9:42 AM      [facility]

## 2019-07-21 NOTE — Patient Instructions (Signed)
  I appreciate the opportunity to provide you with the care for your health and wellness. Today we discussed:  anxiety  Follow up: 07/29/2019  No labs or referrals today  So happy for you on your new job. I am glad the anxiety you were experiencing has improved.  Please continue to practice social distancing to keep you, your family, and our community safe.  If you must go out, please wear a Mask and practice good handwashing.  It was a pleasure to see you and I look forward to continuing to work together on your health and well-being. Please do not hesitate to call the office if you need care or have questions about your care.  Have a wonderful day and week. With Gratitude, Cherly Beach, DNP, AGNP-BC

## 2019-07-27 ENCOUNTER — Telehealth (HOSPITAL_COMMUNITY): Payer: Self-pay

## 2019-07-27 ENCOUNTER — Ambulatory Visit: Payer: MEDICARE | Admitting: Neurology

## 2019-07-27 NOTE — Telephone Encounter (Signed)
Called to schedule angio, no answer, left vm. AW  

## 2019-07-28 ENCOUNTER — Other Ambulatory Visit: Payer: Self-pay | Admitting: Family Medicine

## 2019-07-29 ENCOUNTER — Ambulatory Visit: Payer: Self-pay | Admitting: Family Medicine

## 2019-08-03 ENCOUNTER — Other Ambulatory Visit: Payer: Self-pay | Admitting: Family Medicine

## 2019-08-03 ENCOUNTER — Other Ambulatory Visit: Payer: Self-pay

## 2019-08-03 ENCOUNTER — Telehealth: Payer: Self-pay

## 2019-08-03 MED ORDER — TEMAZEPAM 15 MG PO CAPS: 15.0000 mg | ORAL_CAPSULE | Freq: Every evening | ORAL | 3 refills | Status: DC | PRN

## 2019-08-03 MED ORDER — TEMAZEPAM 15 MG PO CAPS: 15.0000 mg | ORAL_CAPSULE | Freq: Every evening | ORAL | 0 refills | Status: DC | PRN

## 2019-08-03 MED ORDER — BUSPIRONE HCL 5 MG PO TABS: 5.0000 mg | ORAL_TABLET | Freq: Two times a day (BID) | ORAL | 0 refills | Status: DC

## 2019-08-03 MED ORDER — AMLODIPINE BESYLATE 2.5 MG PO TABS: 2.5000 mg | ORAL_TABLET | Freq: Every day | ORAL | 1 refills | Status: DC

## 2019-08-03 NOTE — Progress Notes (Unsigned)
restoril 15

## 2019-08-03 NOTE — Telephone Encounter (Signed)
Amlodipine and Temazepam have been refilled. Buspar not on her active med list but I see she has gotten it as late as Sept of this year. Do you still want her on the Buspar? Please advise.

## 2019-08-03 NOTE — Telephone Encounter (Signed)
pls refill a 3 month supply, needs tele visit to f/u in January wit me, no appt in the sytem for her , thanks

## 2019-08-03 NOTE — Telephone Encounter (Signed)
Pt needs amlodipine , temazepam, and Buspiron called in

## 2019-08-03 NOTE — Telephone Encounter (Signed)
Called patient to schedule telephone visit for January. No answer.Left vm

## 2019-08-04 ENCOUNTER — Telehealth (HOSPITAL_COMMUNITY): Payer: Self-pay

## 2019-08-04 ENCOUNTER — Ambulatory Visit (INDEPENDENT_AMBULATORY_CARE_PROVIDER_SITE_OTHER): Payer: Medicare Other | Admitting: Family Medicine

## 2019-08-04 ENCOUNTER — Other Ambulatory Visit: Payer: Self-pay

## 2019-08-04 ENCOUNTER — Ambulatory Visit: Payer: MEDICARE

## 2019-08-04 ENCOUNTER — Encounter: Payer: Self-pay | Admitting: Family Medicine

## 2019-08-04 ENCOUNTER — Other Ambulatory Visit (HOSPITAL_COMMUNITY)
Admission: RE | Admit: 2019-08-04 | Discharge: 2019-08-04 | Disposition: A | Discharge status: Home / Self Care | Payer: Medicare Other | Source: Ambulatory Visit | Attending: Family Medicine | Admitting: Family Medicine

## 2019-08-04 VITALS — BP 160/70 | HR 95 | Temp 97.5°F | Resp 15 | Ht 64.0 in | Wt 136.0 lb

## 2019-08-04 DIAGNOSIS — R350 Frequency of micturition: Secondary | ICD-10-CM

## 2019-08-04 DIAGNOSIS — N3001 Acute cystitis with hematuria: Secondary | ICD-10-CM | POA: Insufficient documentation

## 2019-08-04 LAB — POCT URINALYSIS DIP (CLINITEK)
Bilirubin, UA: NEGATIVE
Glucose, UA: NEGATIVE mg/dL
Ketones, POC UA: NEGATIVE mg/dL
Nitrite, UA: POSITIVE — AB
POC PROTEIN,UA: 100 — AB
Spec Grav, UA: 1.025 (ref 1.010–1.025)
Urobilinogen, UA: 0.2 E.U./dL
pH, UA: 7.5 (ref 5.0–8.0)

## 2019-08-04 MED ORDER — SULFAMETHOXAZOLE-TRIMETHOPRIM 800-160 MG PO TABS: 1.0000 | ORAL_TABLET | Freq: Two times a day (BID) | ORAL | 0 refills | Status: DC

## 2019-08-04 NOTE — Patient Instructions (Addendum)
I appreciate the opportunity to provide you with the care for your health and wellness. Today we discussed: UTI   Follow up:  As scheduled  No labs or referrals today  Please pick up medications and take as directed. Please hydrate with water daily.   Please continue to practice social distancing to keep you, your family, and our community safe.  If you must go out, please wear a mask and practice good handwashing.  It was a pleasure to see you and I look forward to continuing to work together on your health and well-being. Please do not hesitate to call the office if you need care or have questions about your care.  Have a wonderful day and week. With Gratitude, Cherly Beach, DNP, AGNP-BC   Urinary Tract Infection, Adult A urinary tract infection (UTI) is an infection of any part of the urinary tract. The urinary tract includes:  The kidneys.  The ureters.  The bladder.  The urethra. These organs make, store, and get rid of pee (urine) in the body. What are the causes? This is caused by germs (bacteria) in your genital area. These germs grow and cause swelling (inflammation) of your urinary tract. What increases the risk? You are more likely to develop this condition if:  You have a small, thin tube (catheter) to drain pee.  You cannot control when you pee or poop (incontinence).  You are female, and: ? You use these methods to prevent pregnancy: ? A medicine that kills sperm (spermicide). ? A device that blocks sperm (diaphragm). ? You have low levels of a female hormone (estrogen). ? You are pregnant.  You have genes that add to your risk.  You are sexually active.  You take antibiotic medicines.  You have trouble peeing because of: ? A prostate that is bigger than normal, if you are female. ? A blockage in the part of your body that drains pee from the bladder (urethra). ? A kidney stone. ? A nerve condition that affects your bladder (neurogenic bladder).  ? Not getting enough to drink. ? Not peeing often enough.  You have other conditions, such as: ? Diabetes. ? A weak disease-fighting system (immune system). ? Sickle cell disease. ? Gout. ? Injury of the spine. What are the signs or symptoms? Symptoms of this condition include:  Needing to pee right away (urgently).  Peeing often.  Peeing small amounts often.  Pain or burning when peeing.  Blood in the pee.  Pee that smells bad or not like normal.  Trouble peeing.  Pee that is cloudy.  Fluid coming from the vagina, if you are female.  Pain in the belly or lower back. Other symptoms include:  Throwing up (vomiting).  No urge to eat.  Feeling mixed up (confused).  Being tired and grouchy (irritable).  A fever.  Watery poop (diarrhea). How is this treated? This condition may be treated with:  Antibiotic medicine.  Other medicines.  Drinking enough water. Follow these instructions at home:  Medicines  Take over-the-counter and prescription medicines only as told by your doctor.  If you were prescribed an antibiotic medicine, take it as told by your doctor. Do not stop taking it even if you start to feel better. General instructions  Make sure you: ? Pee until your bladder is empty. ? Do not hold pee for a long time. ? Empty your bladder after sex. ? Wipe from front to back after pooping if you are a female. Use each tissue one  time when you wipe.  Drink enough fluid to keep your pee pale yellow.  Keep all follow-up visits as told by your doctor. This is important. Contact a doctor if:  You do not get better after 1-2 days.  Your symptoms go away and then come back. Get help right away if:  You have very bad back pain.  You have very bad pain in your lower belly.  You have a fever.  You are sick to your stomach (nauseous).  You are throwing up. Summary  A urinary tract infection (UTI) is an infection of any part of the urinary tract.   This condition is caused by germs in your genital area.  There are many risk factors for a UTI. These include having a small, thin tube to drain pee and not being able to control when you pee or poop.  Treatment includes antibiotic medicines for germs.  Drink enough fluid to keep your pee pale yellow. This information is not intended to replace advice given to you by your health care provider. Make sure you discuss any questions you have with your health care provider. Document Released: 03/05/2008 Document Revised: 09/04/2018 Document Reviewed: 03/27/2018 Elsevier Patient Education  2020 [employer].

## 2019-08-04 NOTE — Telephone Encounter (Signed)
Called to schedule angio, no answer, left vm. AW  

## 2019-08-04 NOTE — Telephone Encounter (Signed)
Patient's follow up appt scheduled while she was in the office for same day appt.

## 2019-08-04 NOTE — Progress Notes (Signed)
[id number], Norwood Levo, MD Chief Complaint  Patient presents with  . Urinary Tract Infection    blood in urine    Current Issues:  Presents with 4 days of urinary urgency and urinary frequency Associated symptoms include:  cloudy urine, hematuria, lower abdominal pain and urinary frequency   There is no history of of similar symptoms. Sexually active:  No   No concern for STI.  Prior to Admission medications   Medication Sig Start Date End Date Taking? Authorizing Provider  amLODipine (NORVASC) 2.5 MG tablet Take 1 tablet (2.5 mg total) by mouth daily. 08/03/19  Yes [name], MD  Ascorbic Acid (VITAMIN C) 1000 MG tablet Take 1,000 mg by mouth daily.    Yes [provider]  aspirin EC 81 MG tablet Take 81 mg by mouth at bedtime.   Yes [provider]  busPIRone (BUSPAR) 5 MG tablet Take 1 tablet (5 mg total) by mouth 2 (two) times daily. 08/03/19  Yes [name], MD  cloNIDine (CATAPRES) 0.2 MG tablet Take one tablet by mouth at bedtime for blood pressure 07/20/19  Yes [name], MD  clopidogrel (PLAVIX) 75 MG tablet Take 1 tablet (75 mg total) by mouth daily. 02/25/19  Yes Herminio Commons, MD  nitroGLYCERIN (NITROSTAT) 0.4 MG SL tablet Place 1 tablet (0.4 mg total) under the tongue every 5 (five) minutes as needed. Patient taking differently: Place 0.4 mg under the tongue every 5 (five) minutes x 3 doses as needed for chest pain.  06/06/18  Yes Reino Bellis B, NP  pantoprazole (PROTONIX) 40 MG tablet Take 1 tablet (40 mg total) by mouth daily. 02/25/19  Yes Herminio Commons, MD  Polyethyl Glycol-Propyl Glycol (LUBRICANT EYE DROPS) 0.4-0.3 % SOLN Place 1 drop into both eyes 3 (three) times daily as needed (dry/irritated eyes.).   Yes [provider]  potassium chloride SA (K-DUR) 20 MEQ tablet Take 1 tablet (20 mEq total) by mouth daily. 06/30/19  Yes Tat, Shanon Brow, MD  promethazine (PHENERGAN) 25 MG tablet Take 25 mg by mouth  every 6 (six) hours as needed for nausea. 07/13/19  Yes [provider]  rosuvastatin (CRESTOR) 40 MG tablet Take 1 tablet (40 mg total) by mouth daily. 03/25/19  Yes [name], MD  temazepam (RESTORIL) 15 MG capsule Take 1 capsule (15 mg total) by mouth at bedtime as needed for sleep. 08/03/19  Yes [name], MD  topiramate (TOPAMAX) 25 MG tablet Take 25 mg by mouth at bedtime. 07/13/19  Yes [provider]    Review of Systems: Review of Systems  Constitutional: Negative for chills and fever.  HENT: Negative.   Eyes: Negative.   Respiratory: Negative.   Cardiovascular: Negative.   Gastrointestinal: Negative.  Negative for nausea and vomiting.  Endocrine: Negative.   Genitourinary: Positive for frequency, hematuria and urgency. Negative for difficulty urinating, dyspareunia, dysuria and pelvic pain.       Mild low back pain  Skin: Negative.   Allergic/Immunologic: Negative.   Neurological: Negative.   Hematological: Negative.   Psychiatric/Behavioral: Negative.   All other systems reviewed and are negative.    PE:  BP (!) 170/74   Pulse 95   Temp (!) 97.5 F (36.4 C) (Oral)   Resp 15   Ht 5\' 4"  (1.626 m)   Wt 136 lb 0.6 oz (61.7 kg)   SpO2 95%   BMI 23.35 kg/m  Physical Exam Vitals signs and nursing note reviewed.  Constitutional:  Appearance: Normal appearance. She is well-developed, well-groomed and normal weight.  HENT:     Head: Normocephalic and atraumatic.     Right Ear: External ear normal.     Left Ear: External ear normal.     Nose: Nose normal.     Mouth/Throat:     Mouth: Mucous membranes are moist.     Pharynx: Oropharynx is clear.  Eyes:     General:        Right eye: No discharge.        Left eye: No discharge.     Conjunctiva/sclera: Conjunctivae normal.  Neck:     Musculoskeletal: Normal range of motion and neck supple.  Cardiovascular:     Rate and Rhythm: Normal rate and regular rhythm.     Pulses:  Normal pulses.     Heart sounds: Normal heart sounds.  Pulmonary:     Effort: Pulmonary effort is normal.     Breath sounds: Normal breath sounds.  Abdominal:     General: Bowel sounds are normal.     Palpations: Abdomen is soft.     Tenderness: There is no abdominal tenderness. There is no right CVA tenderness or left CVA tenderness.     Hernia: No hernia is present.  Musculoskeletal: Normal range of motion.  Skin:    General: Skin is warm.  Neurological:     General: No focal deficit present.     Mental Status: She is alert and oriented to person, place, and time.  Psychiatric:        Attention and Perception: Attention normal.        Mood and Affect: Mood normal.        Speech: Speech normal.        Behavior: Behavior normal. Behavior is cooperative.        Thought Content: Thought content normal.        Cognition and Memory: Cognition normal.        Judgment: Judgment normal.      Results for orders placed or performed in visit on 07/07/19  COMPLETE METABOLIC PANEL WITH GFR  Result Value Ref Range   Glucose, Bld 204 (H) 65 - 139 mg/dL   BUN 14 7 - 25 mg/dL   Creat 0.96 0.50 - 0.99 mg/dL   GFR, Est Non African American 60 > OR = 60 mL/min/1.43m2   GFR, Est African American 70 > OR = 60 mL/min/1.74m2   BUN/Creatinine Ratio NOT APPLICABLE 6 - 22 (calc)   Sodium 142 135 - 146 mmol/L   Potassium 4.2 3.5 - 5.3 mmol/L   Chloride 107 98 - 110 mmol/L   CO2 24 20 - 32 mmol/L   Calcium 9.7 8.6 - 10.4 mg/dL   Total Protein 7.2 6.1 - 8.1 g/dL   Albumin 4.4 3.6 - 5.1 g/dL   Globulin 2.8 1.9 - 3.7 g/dL (calc)   AG Ratio 1.6 1.0 - 2.5 (calc)   Total Bilirubin 0.4 0.2 - 1.2 mg/dL   Alkaline phosphatase (APISO) 51 37 - 153 U/L   AST 21 10 - 35 U/L   ALT 14 6 - 29 U/L    Assessment and Plan:   1. Urine frequency S&S consistent with UTI see #2  - POCT URINALYSIS DIP (CLINITEK)  2. Acute cystitis with hematuria S&S consistent with UTI send out for Cx Tx provided, will  change if needed Educated on UTI and prevention and hydration with water  - Urine culture - POCT urinalysis dipstick -  sulfamethoxazole-trimethoprim (BACTRIM DS) 800-160 MG tablet; Take 1 tablet by mouth 2 (two) times daily.  Dispense: 6 tablet; Refill: 0   Follow up: PRN  Perlie Mayo, DNP, AGNP-BC [address], [place] [address] Office Hours: Mon-Thurs 8 am-5 pm; Fri 8 am-12 pm Office Phone:  [phone]  Office Fax: [phone]

## 2019-08-05 NOTE — Progress Notes (Signed)
Please call Tanya [name] and let her know that her urine cx is back and she has e. Coli in her urine. This can happen when not wiping well from front to back. Please discuss good hygiene measures. Also advise that she call the office after she finishes her medication if symptoms are not better. Encourage hydration with water.

## 2019-08-06 ENCOUNTER — Other Ambulatory Visit: Payer: Self-pay | Admitting: Family Medicine

## 2019-08-06 DIAGNOSIS — N3001 Acute cystitis with hematuria: Secondary | ICD-10-CM

## 2019-08-06 LAB — URINE CULTURE: Culture: >=100000 — AB

## 2019-08-06 MED ORDER — CIPROFLOXACIN HCL 500 MG PO TABS: 500.0000 mg | ORAL_TABLET | Freq: Every day | ORAL | 0 refills | Status: AC

## 2019-08-06 NOTE — Progress Notes (Signed)
Please call and ask how Tanya [name] is doing. She will need a different antibiotic due to resistance. Ask if she is okay with this and I will send it in. Thanks

## 2019-08-10 ENCOUNTER — Other Ambulatory Visit (HOSPITAL_COMMUNITY): Payer: Self-pay | Admitting: Interventional Radiology

## 2019-08-10 DIAGNOSIS — I671 Cerebral aneurysm, nonruptured: Secondary | ICD-10-CM

## 2019-08-13 ENCOUNTER — Telehealth: Payer: Self-pay | Admitting: *Deleted

## 2019-08-13 ENCOUNTER — Ambulatory Visit: Payer: Self-pay | Admitting: Cardiovascular Disease

## 2019-08-13 NOTE — Telephone Encounter (Signed)
Patients husband did not schedule the 8am appt at the time of the call. He stated he would let her know but he doubted she could make it that early.

## 2019-08-13 NOTE — Telephone Encounter (Signed)
When I last spoke with patient it was regarding her urine culture and the need to change antibiotics. Hips were never discussed. Please advise.

## 2019-08-13 NOTE — Telephone Encounter (Signed)
Spoke with patients husband and advised him patient would need to be seen by [place] or urgent care. Offered 8am appt tomorrow morning with Cherly Beach NP. He said she doesn't get in til about 1am but he would let her know. I advised him if she couldn't make or didn't want that appt she could go to an urgent care to be evaluated with verbal understanding.

## 2019-08-13 NOTE — Telephone Encounter (Signed)
pls explain Jarrett Soho will address as she has most recently evaluated the patient, No appts left for today. She may also be advised to go to urgent care if the same is true for tomorrow as  His appears  To be a new problem

## 2019-08-13 NOTE — Telephone Encounter (Signed)
Pt called returning your call her hips are still hurting they are no better wanted to know if an antibiotic could be sent to [facility].

## 2019-08-13 NOTE — Telephone Encounter (Signed)
Pt wanted a call back but she has to go to work so she said to call her husband Darnelle Spangle [phone] and let him know it is ok

## 2019-08-31 ENCOUNTER — Other Ambulatory Visit: Payer: Self-pay | Admitting: Student

## 2019-08-31 ENCOUNTER — Other Ambulatory Visit: Payer: Self-pay | Admitting: Physician Assistant

## 2019-09-01 ENCOUNTER — Telehealth (HOSPITAL_COMMUNITY): Payer: Self-pay

## 2019-09-01 ENCOUNTER — Other Ambulatory Visit: Payer: Self-pay

## 2019-09-01 ENCOUNTER — Encounter (HOSPITAL_COMMUNITY): Payer: Self-pay

## 2019-09-01 ENCOUNTER — Other Ambulatory Visit (HOSPITAL_COMMUNITY): Payer: Self-pay | Admitting: Interventional Radiology

## 2019-09-01 ENCOUNTER — Ambulatory Visit (HOSPITAL_COMMUNITY)
Admission: RE | Admit: 2019-09-01 | Discharge: 2019-09-01 | Disposition: A | Discharge status: Home / Self Care | Payer: Medicare Other | Source: Ambulatory Visit | Attending: Interventional Radiology | Admitting: Interventional Radiology

## 2019-09-01 DIAGNOSIS — I2510 Atherosclerotic heart disease of native coronary artery without angina pectoris: Secondary | ICD-10-CM | POA: Diagnosis not present

## 2019-09-01 DIAGNOSIS — Z7982 Long term (current) use of aspirin: Secondary | ICD-10-CM | POA: Insufficient documentation

## 2019-09-01 DIAGNOSIS — F329 Major depressive disorder, single episode, unspecified: Secondary | ICD-10-CM | POA: Diagnosis not present

## 2019-09-01 DIAGNOSIS — E785 Hyperlipidemia, unspecified: Secondary | ICD-10-CM | POA: Insufficient documentation

## 2019-09-01 DIAGNOSIS — F419 Anxiety disorder, unspecified: Secondary | ICD-10-CM | POA: Diagnosis not present

## 2019-09-01 DIAGNOSIS — I10 Essential (primary) hypertension: Secondary | ICD-10-CM | POA: Insufficient documentation

## 2019-09-01 DIAGNOSIS — I671 Cerebral aneurysm, nonruptured: Secondary | ICD-10-CM | POA: Insufficient documentation

## 2019-09-01 DIAGNOSIS — K219 Gastro-esophageal reflux disease without esophagitis: Secondary | ICD-10-CM | POA: Insufficient documentation

## 2019-09-01 DIAGNOSIS — E039 Hypothyroidism, unspecified: Secondary | ICD-10-CM | POA: Diagnosis not present

## 2019-09-01 DIAGNOSIS — Z79899 Other long term (current) drug therapy: Secondary | ICD-10-CM | POA: Insufficient documentation

## 2019-09-01 HISTORY — PX: IR ANGIO INTRA EXTRACRAN SEL COM CAROTID INNOMINATE BILAT MOD SED: IMG5360

## 2019-09-01 HISTORY — PX: IR ANGIO VERTEBRAL SEL VERTEBRAL BILAT MOD SED: IMG5369

## 2019-09-01 LAB — CBC
HCT: 37.6 % (ref 36.0–46.0)
Hemoglobin: 12.7 g/dL (ref 12.0–15.0)
MCH: 31.4 pg (ref 26.0–34.0)
MCHC: 33.8 g/dL (ref 30.0–36.0)
MCV: 92.8 fL (ref 80.0–100.0)
Platelets: 319 10*3/uL (ref 150–400)
RBC: 4.05 MIL/uL (ref 3.87–5.11)
RDW: 13.9 % (ref 11.5–15.5)
WBC: 5.2 10*3/uL (ref 4.0–10.5)
nRBC: 0 % (ref 0.0–0.2)

## 2019-09-01 LAB — BASIC METABOLIC PANEL
Anion gap: 9 (ref 5–15)
BUN: 13 mg/dL (ref 8–23)
CO2: 22 mmol/L (ref 22–32)
Calcium: 9.7 mg/dL (ref 8.9–10.3)
Chloride: 110 mmol/L (ref 98–111)
Creatinine, Ser: 0.66 mg/dL (ref 0.44–1.00)
GFR calc Af Amer: >60 mL/min (ref >60)
GFR calc non Af Amer: >60 mL/min (ref >60)
Glucose, Bld: 98 mg/dL (ref 70–99)
Potassium: 2.9 mmol/L — ABNORMAL LOW (ref 3.5–5.1)
Sodium: 141 mmol/L (ref 135–145)

## 2019-09-01 LAB — PROTIME-INR
INR: 1 (ref 0.8–1.2)
Prothrombin Time: 12.6 seconds (ref 11.4–15.2)

## 2019-09-01 MED ORDER — MIDAZOLAM HCL 2 MG/2ML IJ SOLN
INTRAMUSCULAR | Status: AC | PRN
  Administered 2019-09-01: 1 mg via INTRAVENOUS

## 2019-09-01 MED ORDER — MIDAZOLAM HCL 2 MG/2ML IJ SOLN
INTRAMUSCULAR | Status: AC
  Filled 2019-09-01: qty 2

## 2019-09-01 MED ORDER — SODIUM CHLORIDE 0.9 % IV SOLN
INTRAVENOUS | Status: DC
  Administered 2019-09-01: 07:00:00 via INTRAVENOUS

## 2019-09-01 MED ORDER — POTASSIUM CHLORIDE CRYS ER 20 MEQ PO TBCR
EXTENDED_RELEASE_TABLET | ORAL | Status: AC
  Filled 2019-09-01: qty 2

## 2019-09-01 MED ORDER — HEPARIN SODIUM (PORCINE) 1000 UNIT/ML IJ SOLN
INTRAMUSCULAR | Status: AC
  Filled 2019-09-01: qty 1

## 2019-09-01 MED ORDER — NITROGLYCERIN 1 MG/10 ML FOR IR/CATH LAB
INTRA_ARTERIAL | Status: AC
  Filled 2019-09-01: qty 10

## 2019-09-01 MED ORDER — LIDOCAINE HCL 1 % IJ SOLN
INTRAMUSCULAR | Status: AC
  Filled 2019-09-01: qty 20

## 2019-09-01 MED ORDER — SODIUM CHLORIDE 0.9 % IV SOLN: INTRAVENOUS | Status: AC

## 2019-09-01 MED ORDER — IOHEXOL 300 MG/ML  SOLN
150.0000 mL | Freq: Once | INTRAMUSCULAR | Status: AC | PRN
  Administered 2019-09-01: 75 mL via INTRA_ARTERIAL

## 2019-09-01 MED ORDER — FENTANYL CITRATE (PF) 100 MCG/2ML IJ SOLN
INTRAMUSCULAR | Status: AC | PRN
  Administered 2019-09-01: 25 ug via INTRAVENOUS

## 2019-09-01 MED ORDER — HEPARIN SODIUM (PORCINE) 1000 UNIT/ML IJ SOLN
INTRAMUSCULAR | Status: AC | PRN
  Administered 2019-09-01: 1000 [IU] via INTRAVENOUS

## 2019-09-01 MED ORDER — VERAPAMIL HCL 2.5 MG/ML IV SOLN
INTRAVENOUS | Status: AC
  Filled 2019-09-01: qty 2

## 2019-09-01 MED ORDER — FENTANYL CITRATE (PF) 100 MCG/2ML IJ SOLN
INTRAMUSCULAR | Status: AC
  Filled 2019-09-01: qty 2

## 2019-09-01 MED ORDER — POTASSIUM CHLORIDE CRYS ER 20 MEQ PO TBCR
40.0000 meq | EXTENDED_RELEASE_TABLET | Freq: Once | ORAL | Status: AC
  Administered 2019-09-01: 40 meq via ORAL

## 2019-09-01 NOTE — Sedation Documentation (Signed)
ETCo2/O2 removed per md

## 2019-09-01 NOTE — Discharge Instructions (Signed)
Cerebral Angiogram  A cerebral angiogram is a procedure that is used to examine the blood vessels in the brain and neck. In this procedure, contrast dye is injected through a long, thin tube (catheter) into an artery. X-rays are then taken, which show if there is a blockage or problem in a blood vessel. Tell a health care provider about:  Any allergies you have, including allergies to shellfish, contrast dye, or iodine  All medicines you are taking, including vitamins, herbs, eye drops, creams, and over-the-counter medicines.  Any problems you or family members have had with anesthetic medicines.  Any blood disorders you have.  Any surgeries you have had.  Any medical conditions you have.  Whether you are pregnant or may be pregnant.  Whether you are currently breastfeeding. What are the risks? Generally, this is a safe procedure. However, problems may occur, including:  Damage to surrounding nerves, tissues, or structures.  Blood clot.  Inability to remember what happened (amnesia). This is usually temporary.  Weakness, numbness, speech, or vision problems. This is usually temporary.  Stroke.  Kidney injury.  Bleeding or bruising.  Allergic reaction medicines or dyes.  Infection. What happens before the procedure? Staying hydrated Follow instructions from your health care provider about hydration, which may include:  Up to 2 hours before the procedure - you may continue to drink clear liquids, such as water, clear fruit juice, black coffee, and plain tea. Eating and drinking restrictions Follow instructions from your health care provider about eating and drinking, which may include:  8 hours before the procedure - stop eating heavy meals or foods such as meat, fried foods, or fatty foods.  6 hours before the procedure - stop eating light meals or foods, such as toast or cereal.  6 hours before the procedure - stop drinking milk or drinks that contain milk.  2  hours before the procedure - stop drinking clear liquids. General instructions  Ask your health care provider about: ? Changing or stopping your regular medicines. This is especially important if you are taking diabetes medicines or blood thinners. ? Taking medicines such as aspirin or ibuprofen. These medicines can thin your blood. Do not take these medicines before your procedure if your health care provider asks you not to.  You may have blood tests done.  Plan to have someone take you home from the hospital or clinic.  If you will be going home right after the procedure, plan to have someone with you for 24 hours. What happens during the procedure?  To reduce your risk of infection: ? Your health care team will wash or sanitize their hands. ? Your skin will be washed with soap. ? Hair may be removed from the surgical area.  You will lie on your back on an imaging bed with an X-ray machine around you.  Your head will be secured to the bed with a strap or device to help you keep still.  An IV tube will be inserted into one of your veins.  You will be given one or more of the following: ? A medicine to help you relax (sedative). ? A medicine to numb the area (local anesthetic) where the catheter will be inserted. This is usually your groin, leg, or arm.  Your heart rate and other vital signs will be watched carefully. You may have electrodes placed on your chest.  A small cut (incision) will be made where the catheter will be inserted.  The catheter will be inserted into  an artery that leads to the head. You may feel slight pressure.  The catheter will be moved through the body up to your neck and brain. X-ray images will help your health care provider bring the catheter to the correct location.  The dye will be injected into the catheter and will travel to your head or neck area. You may feel a warming or burning sensation or notice a strange taste in your mouth as the dye goes  through your system.  Images will be taken to show how the dye flows through the area.  While the images are being taken, you may be given instructions on breathing, swallowing, moving, or talking.  When the images are finished, the catheter will be slowly removed.  Pressure will be applied to the skin to stop any bleeding. A tight bandage (dressing) or seal will be applied to the skin.  Your IV will be removed. The procedure may vary among health care providers and hospitals. What happens after the procedure?  Your blood pressure, heart rate, breathing rate, and blood oxygen level will be monitored until the medicines you were given have worn off.  You will be asked to lie flat for several hours. The arm or leg where the catheter was inserted will need to be kept straight while you are in the recovery room.  The insertion site will be watched for bleeding and you will be checked often.  You will be instructed to drink plenty of fluids. This will help wash the contrast dye out of your system.  Do not drive for 24 hours if you received a sedative.  It is up to you to get the results of your procedure. Ask your health care provider, or the department that is doing the procedure, when your results will be ready. Summary  A cerebral angiogram is a procedure that is used to examine the blood vessels in the brain and neck.  In this procedure, contrast dye is injected through a long, thin tube (catheter) into an artery. X-rays are then taken, which show if there is a blockage or problem in a blood vessel.  You will be given a sedative to help you relax during the procedure. A local anesthetic will be used to numb the area where the catheter is inserted. You may feel pressure when the catheter is inserted, and you may feel a warm sensation when the dye is injected.  After the procedure, you will be asked to lie flat for several hours. The arm or leg where the catheter was inserted will need  to be kept straight while you are in the recovery room. This information is not intended to replace advice given to you by your health care provider. Make sure you discuss any questions you have with your health care provider. Document Released: 02/01/2014 Document Revised: 08/30/2017 Document Reviewed: 10/22/2016 Elsevier Patient Education  2020 [place] After This sheet gives you information about how to care for yourself after your procedure. Your doctor may also give you more specific instructions. If you have problems or questions, contact your doctor. Follow these instructions at home: Insertion site care  Follow instructions from your doctor about how to take care of your long, thin tube (catheter) insertion area. Make sure you: ? Wash your hands with soap and water before you change your bandage (dressing). If you cannot use soap and water, use hand sanitizer. ? Change your bandage as told by your doctor. ? Leave stitches (sutures),  skin glue, or skin tape (adhesive) strips in place. They may need to stay in place for 2 weeks or longer. If tape strips get loose and curl up, you may trim the loose edges. Do not remove tape strips completely unless your doctor says it is okay.  Do not take baths, swim, or use a hot tub until your doctor says it is okay.  You may shower 24-48 hours after the procedure or as told by your doctor. ? Gently wash the area with plain soap and water. ? Pat the area dry with a clean towel. ? Do not rub the area. This may cause bleeding.  Do not apply powder or lotion to the area. Keep the area clean and dry.  Check your insertion area every day for signs of infection. Check for: ? More redness, swelling, or pain. ? Fluid or blood. ? Warmth. ? Pus or a bad smell. Activity  Rest as told by your doctor, usually for 1-2 days.  Do not lift anything that is heavier than 10 lbs. (4.5 kg) or as told by your doctor.  Do not drive for 24  hours if you were given a medicine to help you relax (sedative).  Do not drive or use heavy machinery while taking prescription pain medicine. General instructions   Go back to your normal activities as told by your doctor, usually in about a week. Ask your doctor what activities are safe for you.  If the insertion area starts to bleed, lie flat and put pressure on the area. If the bleeding does not stop, get help right away. This is an emergency.  Drink enough fluid to keep your pee (urine) clear or pale yellow.  Take over-the-counter and prescription medicines only as told by your doctor.  Keep all follow-up visits as told by your doctor. This is important. Contact a doctor if:  You have a fever.  You have chills.  You have more redness, swelling, or pain around your insertion area.  You have fluid or blood coming from your insertion area.  The insertion area feels warm to the touch.  You have pus or a bad smell coming from your insertion area.  You have more bruising around the insertion area.  Blood collects in the tissue around the insertion area (hematoma) that may be painful to the touch. Get help right away if:  You have a lot of pain in the insertion area.  The insertion area swells very fast.  The insertion area is bleeding, and the bleeding does not stop after holding steady pressure on the area.  The area near or just beyond the insertion area becomes pale, cool, tingly, or numb. These symptoms may be an emergency. Do not wait to see if the symptoms will go away. Get medical help right away. Call your local emergency services (911 in the [place] Do not drive yourself to the hospital. Summary  After the procedure, it is common to have bruising and tenderness at the long, thin tube insertion area.  After the procedure, it is important to rest and drink plenty of fluids.  Do not take baths, swim, or use a hot tub until your doctor says it is okay to do so. You  may shower 24-48 hours after the procedure or as told by your doctor.  If the insertion area starts to bleed, lie flat and put pressure on the area. If the bleeding does not stop, get help right away. This is an emergency. This information  is not intended to replace advice given to you by your health care provider. Make sure you discuss any questions you have with your health care provider. Document Released: 12/14/2008 Document Revised: 08/30/2017 Document Reviewed: 09/11/2016 Elsevier Patient Education  [place].  Moderate Conscious Sedation, Adult, Care After These instructions provide you with information about caring for yourself after your procedure. Your health care provider may also give you more specific instructions. Your treatment has been planned according to current medical practices, but problems sometimes occur. Call your health care provider if you have any problems or questions after your procedure. What can I expect after the procedure? After your procedure, it is common:  To feel sleepy for several hours.  To feel clumsy and have poor balance for several hours.  To have poor judgment for several hours.  To vomit if you eat too soon. Follow these instructions at home: For at least 24 hours after the procedure:   Do not: ? Participate in activities where you could fall or become injured. ? Drive. ? Use heavy machinery. ? Drink alcohol. ? Take sleeping pills or medicines that cause drowsiness. ? Make important decisions or sign legal documents. ? Take care of children on your own.  Rest. Eating and drinking  Follow the diet recommended by your health care provider.  If you vomit: ? Drink water, juice, or soup when you can drink without vomiting. ? Make sure you have little or no nausea before eating solid foods. General instructions  Have a responsible adult stay with you until you are awake and alert.  Take over-the-counter and prescription  medicines only as told by your health care provider.  If you smoke, do not smoke without supervision.  Keep all follow-up visits as told by your health care provider. This is important. Contact a health care provider if:  You keep feeling nauseous or you keep vomiting.  You feel light-headed.  You develop a rash.  You have a fever. Get help right away if:  You have trouble breathing. This information is not intended to replace advice given to you by your health care provider. Make sure you discuss any questions you have with your health care provider. Document Released: 07/08/2013 Document Revised: 08/30/2017 Document Reviewed: 01/07/2016 Elsevier Patient Education  2020 [employer].

## 2019-09-01 NOTE — Progress Notes (Signed)
No bleeding or swelling noted after ambulation 

## 2019-09-01 NOTE — Sedation Documentation (Signed)
Pt has bruising to right inner thigh marked with marker

## 2019-09-01 NOTE — Procedures (Signed)
S/P  4 vessel cerebral arteriogram RT CFA approach  Findings. 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 S./Thierno Hun MD

## 2019-09-01 NOTE — Sedation Documentation (Signed)
Pressure held until [phone]

## 2019-09-01 NOTE — Sedation Documentation (Signed)
5 Fr exoseal R groin

## 2019-09-01 NOTE — H&P (Signed)
Chief Complaint: Patient was seen in consultation today for image guided diagnostic cerebral angiogram.  Referring Physician(s): Dr. Merlene Laughter  Supervising Physician: Luanne Bras  Patient Status: [name] - Out-pt  History of Present Illness: [name] is a 68 y.o. female with a past medical history significant for depression, anxiety, hypothyroidism, GERD, HLD, HTN, CAD s/p stenting, chronic headaches and intermittent numbness/tingling who presents today for a diagnostic cerebral angiogram. Patient is somewhat of a poor historian but states that she was previously having headaches "all over all the time" however these have recently improved after she started a new medication, she is not sure what medication it is or who prescribed the medication. She does continue to have intermittent right sided headaches without known triggers. She reports blurry vision and intermittent tingling/numbness in her hands which she states she was told is a side effect of her medications. She states that she occasionally has dizziness with standing which resolves quickly and is not associated with any other symptoms. She has a history of high blood pressure and as far she knows it is well controlled. She states understanding of the requested procedure and wishes to proceed.   Past Medical History:  Diagnosis Date  . Anhedonia   . CAD (coronary artery disease)    a. s/p DESx2 to proximal LAD in 06/2018 b. cath in 04/2019 showing 40% mid-LAD ISR with no significant stenosis along LM, LCx, or RCA  . Cystitis   . Elevated liver enzymes   . Fatty liver   . GERD (gastroesophageal reflux disease)   . Hyperlipidemia   . Hypertension   . Insomnia   . Unspecified hypothyroidism   . Vaginitis     Past Surgical History:  Procedure Laterality Date  . CATARACT EXTRACTION W/PHACO Right 05/08/2019   Procedure: CATARACT EXTRACTION PHACO AND INTRAOCULAR LENS PLACEMENT (IOC);  Surgeon: Baruch Goldmann, MD;   Location: [facility];  Service: Ophthalmology;  Laterality: Right;  CDE: 10.02  . COLONOSCOPY N/A 02/08/2016   Procedure: COLONOSCOPY;  Surgeon: [name], MD;  Location: [facility];  Service: Endoscopy;  Laterality: N/A;  1200 - moved to 11:45 - Ann to notify  . CORONARY STENT INTERVENTION N/A 06/06/2018   Procedure: CORONARY STENT INTERVENTION;  Surgeon: [name], [name], MD;  Location: [facility];  Service: Cardiovascular;  Laterality: N/A;  . INTRAVASCULAR PRESSURE WIRE/FFR STUDY N/A 06/06/2018   Procedure: INTRAVASCULAR PRESSURE WIRE/FFR STUDY;  Surgeon: [name], [name], MD;  Location: [facility];  Service: Cardiovascular;  Laterality: N/A;  . LEFT HEART CATH AND CORONARY ANGIOGRAPHY N/A 04/24/2019   Procedure: LEFT HEART CATH AND CORONARY ANGIOGRAPHY;  Surgeon: Belva Crome, MD;  Location: [facility];  Service: Cardiovascular;  Laterality: N/A;  . LEFT HEART CATHETERIZATION WITH CORONARY ANGIOGRAM N/A 11/19/2014   Procedure: LEFT HEART CATHETERIZATION WITH CORONARY ANGIOGRAM;  Surgeon: [name], MD;  Location: [name];  Service: Cardiovascular;  Laterality: N/A;  . RIGHT/LEFT HEART CATH AND CORONARY ANGIOGRAPHY N/A 06/06/2018   Procedure: RIGHT/LEFT HEART CATH AND CORONARY ANGIOGRAPHY;  Surgeon: [name], [name], MD;  Location: [facility];  Service: Cardiovascular;  Laterality: N/A;    Allergies: Patient has no known allergies.  Medications: Prior to Admission medications   Medication Sig Start Date End Date Taking? Authorizing Provider  amLODipine (NORVASC) 2.5 MG tablet Take 1 tablet (2.5 mg total) by mouth daily. 08/03/19  Yes [name], MD  Ascorbic Acid (VITAMIN C) 1000  MG tablet Take 1,000 mg by mouth daily.    Yes [provider]  aspirin EC 81 MG tablet Take 81 mg by mouth at bedtime.   Yes [provider]  Biotin 5000 MCG CAPS Take 5,000 mcg by mouth daily.   Yes [provider]  busPIRone (BUSPAR) 5 MG  tablet Take 1 tablet (5 mg total) by mouth 2 (two) times daily. 08/03/19  Yes [name], MD  Carboxymethylcellulose Sodium (THERATEARS OP) Place 1 drop into both eyes 3 (three) times daily.   Yes [provider]  cloNIDine (CATAPRES) 0.2 MG tablet Take one tablet by mouth at bedtime for blood pressure Patient taking differently: Take 0.2 mg by mouth at bedtime.  07/20/19  Yes [name], MD  clopidogrel (PLAVIX) 75 MG tablet Take 1 tablet (75 mg total) by mouth daily. 02/25/19  Yes Herminio Commons, MD  nitroGLYCERIN (NITROSTAT) 0.4 MG SL tablet Place 1 tablet (0.4 mg total) under the tongue every 5 (five) minutes as needed. Patient taking differently: Place 0.4 mg under the tongue every 5 (five) minutes x 3 doses as needed for chest pain.  06/06/18  Yes Cheryln Manly, NP  Omega-3 Fatty Acids (FISH OIL) 1000 MG CAPS Take 1,000 mg by mouth daily.   Yes [provider]  pantoprazole (PROTONIX) 40 MG tablet Take 1 tablet (40 mg total) by mouth daily. 02/25/19  Yes Herminio Commons, MD  promethazine (PHENERGAN) 25 MG tablet Take 25 mg by mouth every 6 (six) hours as needed for nausea. 07/13/19  Yes [provider]  rosuvastatin (CRESTOR) 40 MG tablet Take 1 tablet (40 mg total) by mouth daily. 03/25/19  Yes [name], MD  temazepam (RESTORIL) 15 MG capsule Take 1 capsule (15 mg total) by mouth at bedtime as needed for sleep. 08/03/19  Yes [name], MD  topiramate (TOPAMAX) 25 MG tablet Take 50 mg by mouth at bedtime.  07/13/19  Yes [provider]  potassium chloride SA (K-DUR) 20 MEQ tablet Take 1 tablet (20 mEq total) by mouth daily. Patient not taking: Reported on 08/26/2019 06/30/19   Orson Eva, MD  sulfamethoxazole-trimethoprim (BACTRIM DS) 800-160 MG tablet Take 1 tablet by mouth 2 (two) times daily. Patient not taking: Reported on 08/26/2019 08/04/19   Perlie Mayo, NP     Family History  Problem Relation Age  of Onset  . Dementia Mother   . Heart disease Mother        heart attack   . Hypertension Mother   . Heart attack Father   . Heart disease Father        heart attack   . Hypertension Father     Social History   Socioeconomic History  . Marital status: Married    Spouse name: Not on file  . Number of children: Not on file  . Years of education: Not on file  . Highest education level: Not on file  Occupational History  . Not on file  Social Needs  . Financial resource strain: Not on file  . Food insecurity    Worry: Not on file    Inability: Not on file  . Transportation needs    Medical: Not on file    Non-medical: Not on file  Tobacco Use  . Smoking status: Never Smoker  . Smokeless tobacco: Never Used  Substance and Sexual Activity  . Alcohol use: No    Alcohol/week: 0.0 standard drinks  . Drug  use: No  . Sexual activity: Not on file  Lifestyle  . Physical activity    Days per week: Not on file    Minutes per session: Not on file  . Stress: Not on file  Relationships  . Social Herbalist on phone: Not on file    Gets together: Not on file    Attends religious service: Not on file    Active member of club or organization: Not on file    Attends meetings of clubs or organizations: Not on file    Relationship status: Not on file  Other Topics Concern  . Not on file  Social History Narrative   Pt originally from the [place] been in [place] for 20 years       Review of Systems: A 12 point ROS discussed and pertinent positives are indicated in the HPI above.  All other systems are negative.  Review of Systems  Constitutional: Negative for appetite change, chills and fever.  HENT: Negative for nosebleeds.   Respiratory: Positive for shortness of breath ("with a lot of walking"). Negative for cough.   Cardiovascular: Negative for chest pain.  Gastrointestinal: Negative for abdominal pain, blood in stool, diarrhea, nausea and vomiting.   Musculoskeletal: Negative for back pain.  Skin: Negative for rash and wound.  Neurological: Positive for dizziness, numbness and headaches. Negative for tremors, seizures, syncope, facial asymmetry, speech difficulty and weakness.    Vital Signs: BP 138/76   Pulse (!) 55   Temp 97.7 F (36.5 C) (Oral)   Resp 16   Ht 5\' 4"  (1.626 m)   Wt 130 lb (59 kg)   SpO2 100%   BMI 22.31 kg/m   Physical Exam Vitals signs reviewed.  Constitutional:      General: She is not in acute distress. HENT:     Head: Normocephalic.     Mouth/Throat:     Mouth: Mucous membranes are moist.     Pharynx: Oropharynx is clear. No oropharyngeal exudate or posterior oropharyngeal erythema.  Cardiovascular:     Rate and Rhythm: Normal rate and regular rhythm.     Pulses: Normal pulses.     Comments: Palpable distal pulses bilaterally Pulmonary:     Effort: Pulmonary effort is normal.     Breath sounds: Normal breath sounds.  Abdominal:     General: Bowel sounds are normal. There is no distension.     Palpations: Abdomen is soft.     Tenderness: There is no abdominal tenderness.  Skin:    General: Skin is warm and dry.  Neurological:     Mental Status: She is alert and oriented to person, place, and time.  Psychiatric:        Mood and Affect: Mood normal.        Behavior: Behavior normal.        Thought Content: Thought content normal.        Judgment: Judgment normal.      MD Evaluation Airway: WNL Heart: WNL Abdomen: WNL Chest/ Lungs: WNL ASA  Classification: 3 Mallampati/Airway Score: Two   Imaging: No results found.  Labs:  CBC: Recent Labs    06/16/19 0549 06/29/19 0005 06/29/19 0946 09/01/19 0624  WBC 4.8 6.6 4.9 5.2  HGB 12.0 12.7 13.1 12.7  HCT 36.1 37.1 39.9 37.6  PLT 321 357 355 319    COAGS: Recent Labs    06/15/19 1442 06/29/19 0005  INR 1.0 1.0  APTT 26 27  BMP: Recent Labs    06/29/19 0946 06/30/19 0456 07/13/19 1414 09/01/19 0624  NA 139  144 142 141  K 3.2* 3.5 4.2 2.9*  CL 107 114* 107 110  CO2 23 24 24 22   GLUCOSE 109* 95 204* 98  BUN 13 11 14 13   CALCIUM 9.0 8.9 9.7 9.7  CREATININE 0.66 0.65 0.96 0.66  GFRNONAA >60 >60 60 >60  GFRAA >60 >60 70 >60    LIVER FUNCTION TESTS: Recent Labs    06/15/19 1442 06/16/19 0549 06/29/19 0005 06/29/19 0946 07/13/19 1414  BILITOT 0.6 0.7 0.7 0.8 0.4  AST 28 22 27 23 21   ALT 21 19 19 17 14   ALKPHOS 54 44 55 48  --   PROT 7.8 6.8 7.5 7.0 7.2  ALBUMIN 4.5 3.8 4.5 4.1  --     TUMOR MARKERS: No results for input(s): AFPTM, CEA, CA199, CHROMGRNA in the last 8760 hours.  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  Patient has been NPO since midnight, she does not take blood thinning medications. Afebrile, WBC 5.2, hgb 12.7, plt 319, creatinine 0.66, K+ 2.9 -- asymptomatic; will give potassium chloride 40 mEq PO x 1 today prior to sedation, INR pending at time of this note writing.  Risks and benefits of diagnostic cerebral angiogram were discussed with the patient including, but not limited to bleeding, infection, vascular injury, stroke, or contrast induced renal failure.  This interventional procedure involves the use of X-rays and because of the nature of the planned procedure, it is possible that we will have prolonged use of X-ray fluoroscopy. Potential radiation risks to you include (but are not limited to) the following: - A slightly elevated risk for cancer  several years later in life. This risk is typically less than 0.5% percent. This risk is low in comparison to the normal incidence of human cancer, which is 33% for women and 50% for men according to the [place]. -  Radiation induced injury can include skin redness, resembling a rash, tissue breakdown / ulcers and hair loss (which can be temporary or permanent).  The likelihood of either of these occurring depends on the difficulty of the procedure and whether you are sensitive to radiation due to previous procedures, disease, or genetic conditions.  IF your procedure requires a prolonged use of radiation, you will be notified and given written instructions for further action.  It is your responsibility to monitor the irradiated area for the 2 weeks following the procedure and to notify your physician if you are concerned that you have suffered a radiation induced injury.    All of the patient's questions were answered, patient is agreeable to proceed.  Consent signed and in chart.  Thank you for this interesting consult.  I greatly enjoyed meeting Carlesha A Lar[place]ee and look forward to participating in their care.  A copy of this report was sent to the requesting provider on this date.  Electronically Signed: Joaquim Nam, PA-C 09/01/2019, 8:10 AM   I spent a total of15 Minutes in face to face in clinical consultation, greater than 50% of which was counseling/coordinating care for diagnostic cerebral angiogram.

## 2019-09-01 NOTE — Telephone Encounter (Signed)
Called to schedule a consult, no answer, left vm. AW

## 2019-09-02 ENCOUNTER — Other Ambulatory Visit: Payer: Self-pay | Admitting: Family Medicine

## 2019-09-02 ENCOUNTER — Other Ambulatory Visit: Payer: Self-pay

## 2019-09-02 ENCOUNTER — Telehealth: Payer: Self-pay

## 2019-09-02 ENCOUNTER — Other Ambulatory Visit (HOSPITAL_COMMUNITY): Payer: Self-pay | Admitting: Interventional Radiology

## 2019-09-02 ENCOUNTER — Encounter (HOSPITAL_COMMUNITY): Payer: Self-pay | Admitting: Interventional Radiology

## 2019-09-02 DIAGNOSIS — I671 Cerebral aneurysm, nonruptured: Secondary | ICD-10-CM

## 2019-09-02 DIAGNOSIS — E876 Hypokalemia: Secondary | ICD-10-CM

## 2019-09-02 MED ORDER — POTASSIUM CHLORIDE CRYS ER 20 MEQ PO TBCR: 20.0000 meq | EXTENDED_RELEASE_TABLET | Freq: Every day | ORAL | 3 refills | Status: DC

## 2019-09-02 NOTE — Telephone Encounter (Signed)
Pt is calling to let us know that the hosp told her that her Potassium is very Low, please call the pt back

## 2019-09-02 NOTE — Telephone Encounter (Signed)
Patient advised of treatment plan with verbal understanding.

## 2019-09-02 NOTE — Telephone Encounter (Signed)
Patient states that she had surgery yesterday and they told her that her potassium was low and gave her 2 potassium tablets prior to her surgery. They asked if she was taking potassium and she stated no. Please advise.

## 2019-09-02 NOTE — Progress Notes (Signed)
klor con 20

## 2019-09-02 NOTE — Telephone Encounter (Signed)
Yes , it was low yesterday, I  Want hher to continue to take potassium 20 meq daily after she finishes the 7 prescribed yesterday and have sent in to her local pharmacy, one daily.Will need rept chem 7 and eGFR  3 to 5 days before her Jan appt , please order and let her know

## 2019-09-06 IMAGING — MG DIGITAL SCREENING BILATERAL MAMMOGRAM WITH TOMO AND CAD
6 of 10 series · 6 of 30 positions shown · non-contrast
Comparison: Previous exam(s).

CLINICAL DATA: Screening.

EXAM:
DIGITAL SCREENING BILATERAL MAMMOGRAM WITH TOMO AND CAD

[L MLO synth-2D (1 of 2)]
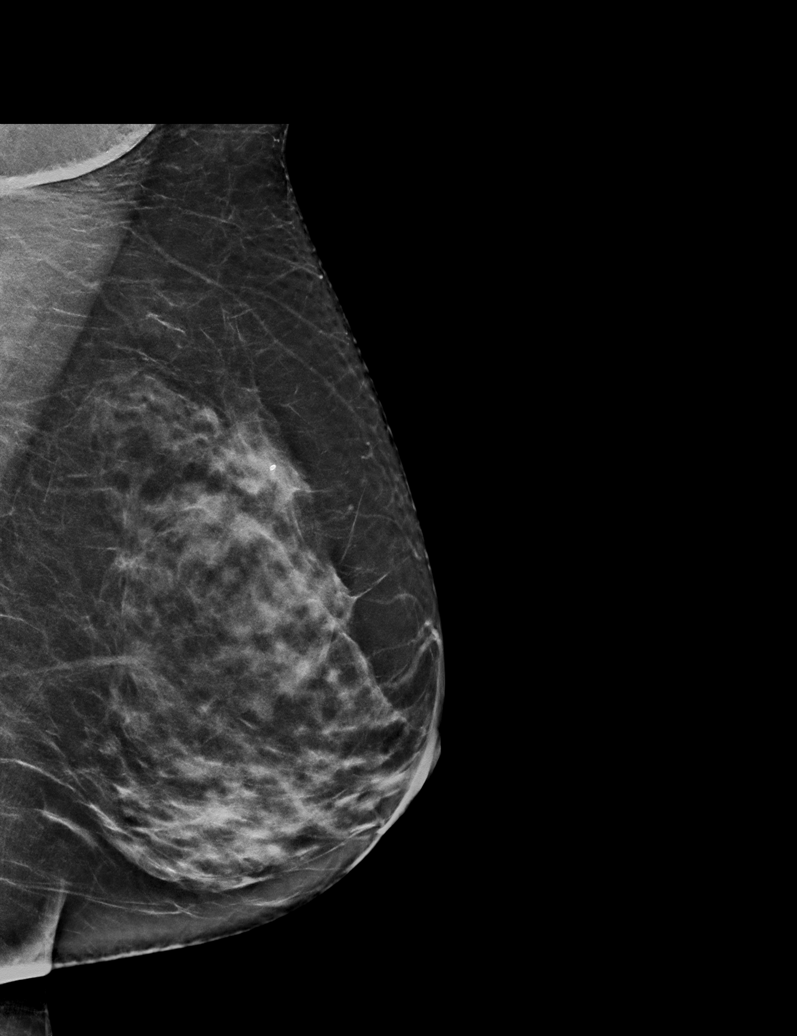

[R CC synth-2D]
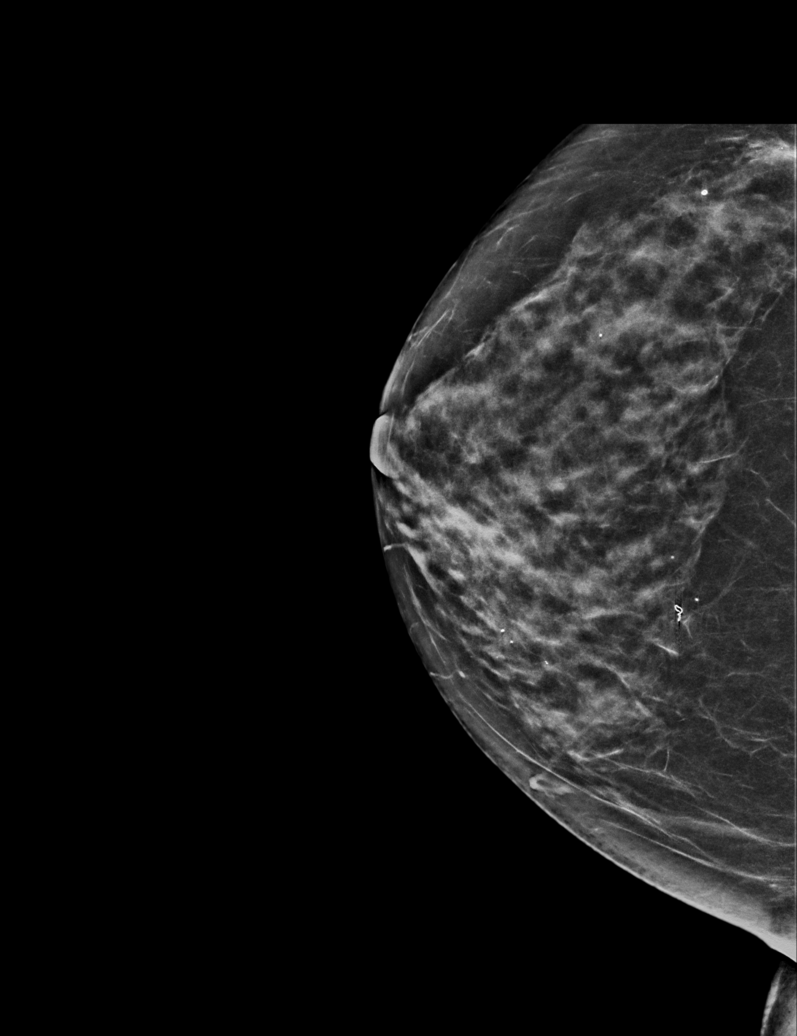

[L MLO synth-2D (2 of 2)]
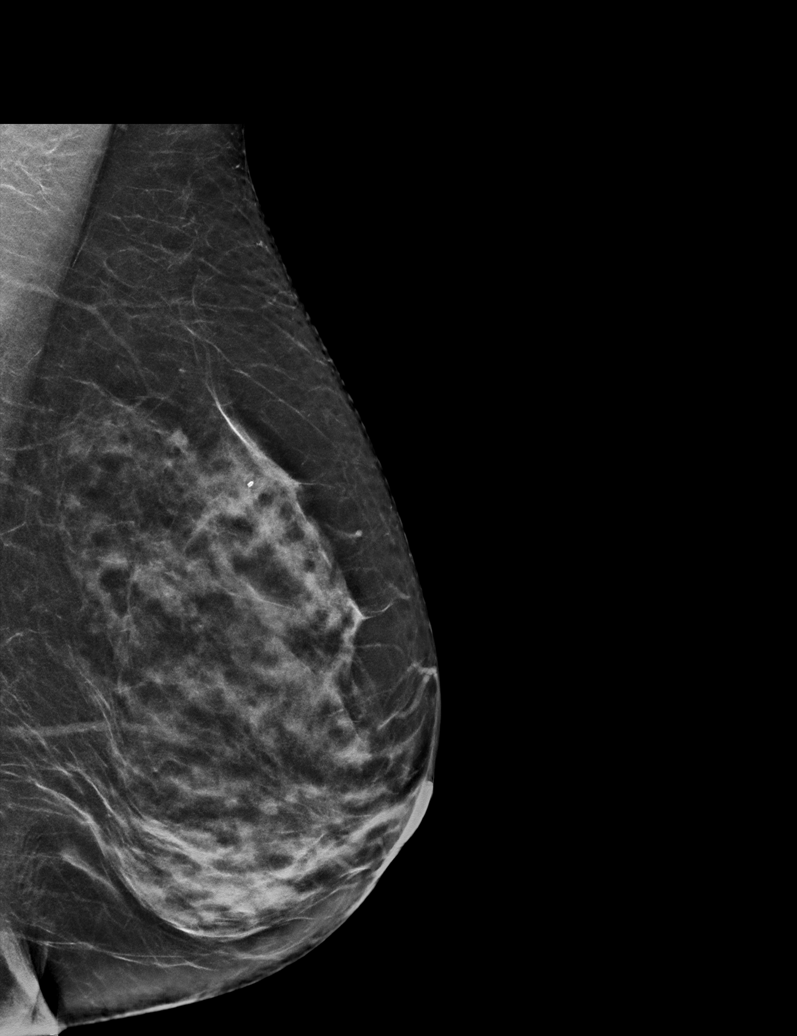

[R MLO synth-2D]
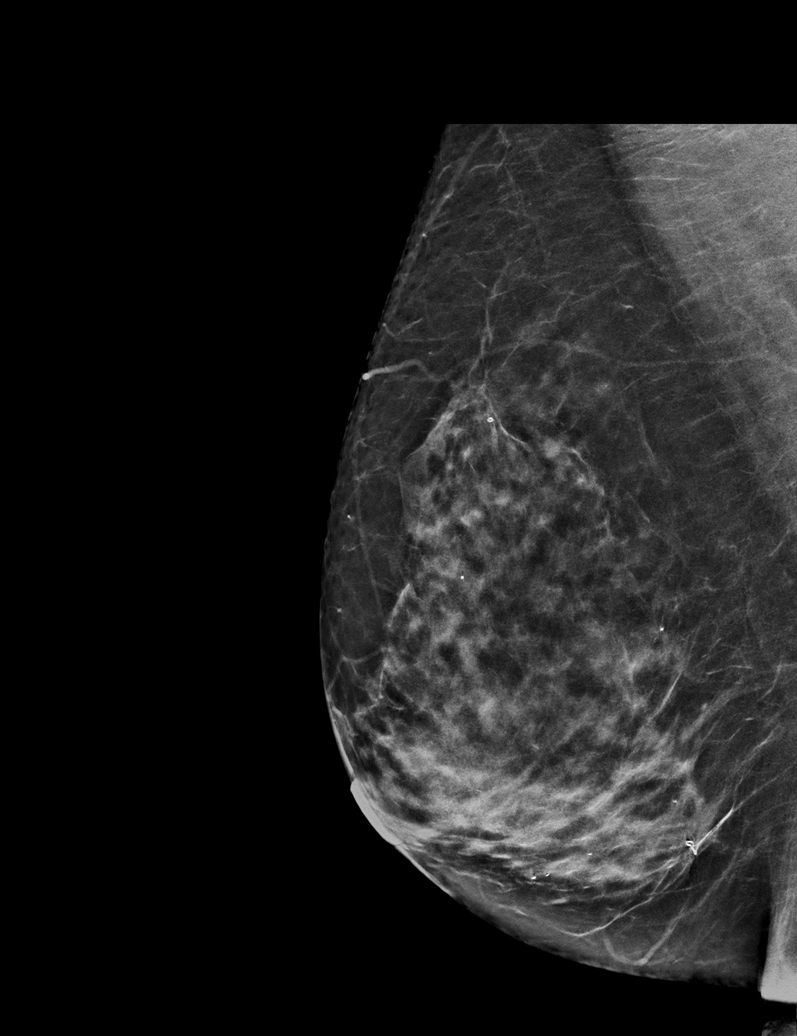

[L CC synth-2D]
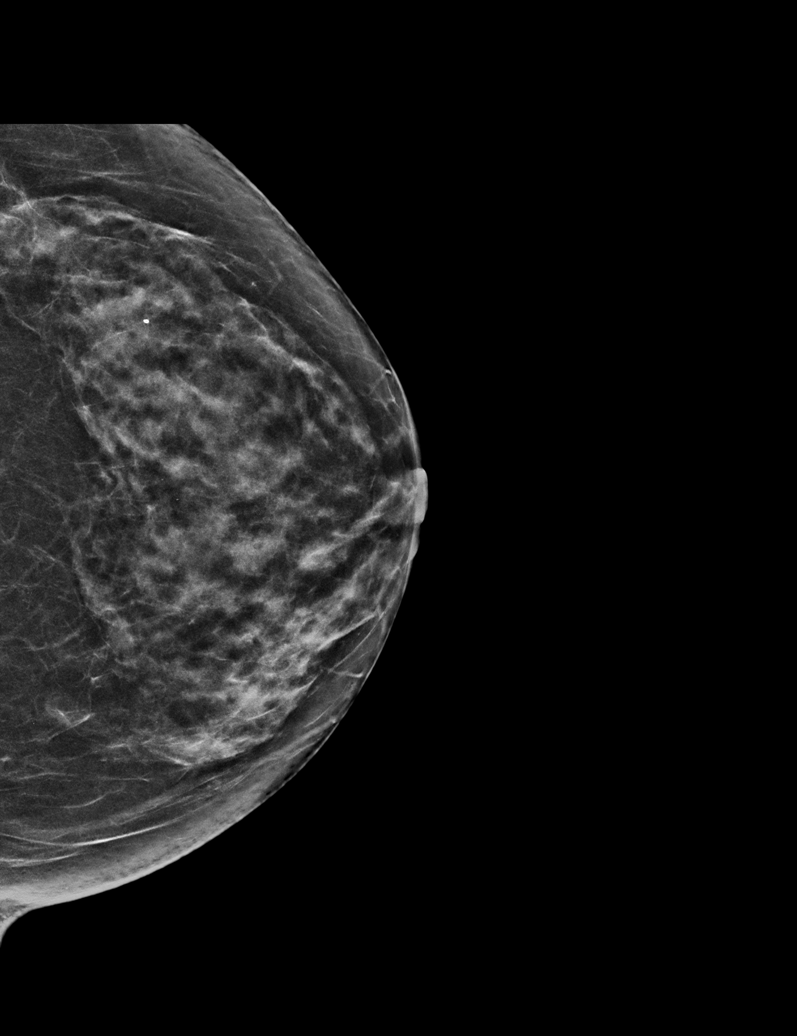

[L MLO tomo · tomo slice 32/63.0]
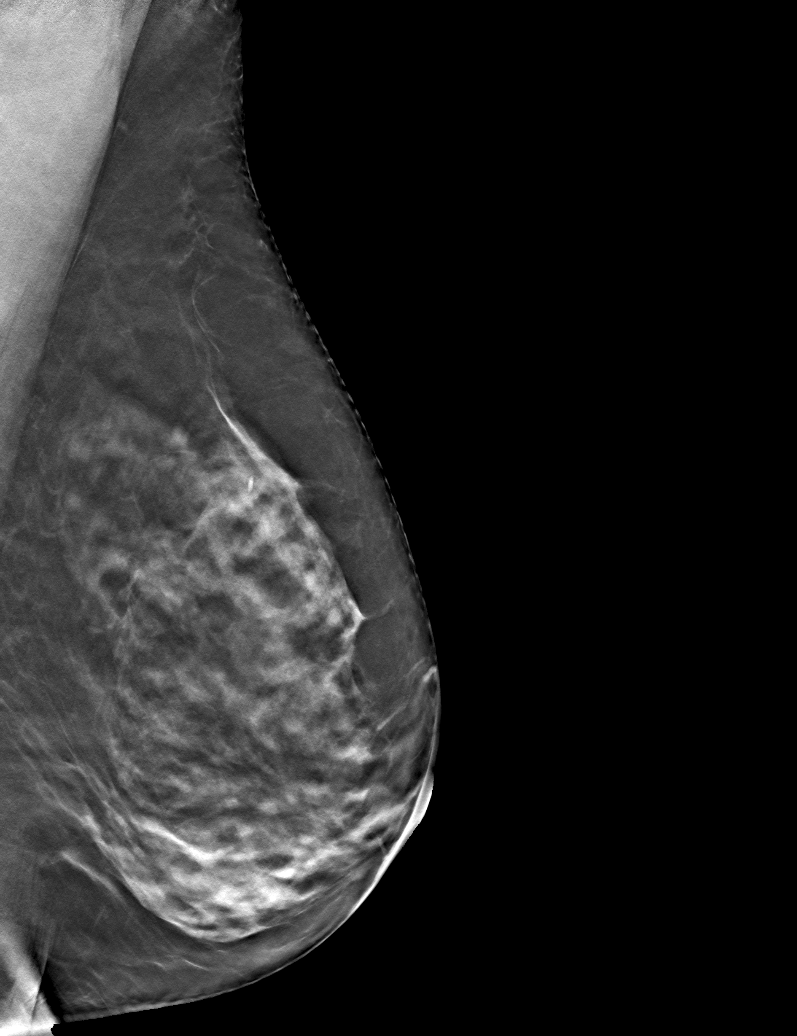

[6 of 30 positions shown; findings below may reference images not displayed]

ACR Breast Density Category c: The breast tissue is heterogeneously
dense, which may obscure small masses.
FINDINGS: There are no findings suspicious for malignancy. Images were
processed with CAD.
IMPRESSION: No mammographic evidence of malignancy. A result letter of this
screening mammogram will be mailed directly to the patient.

RECOMMENDATION:
Screening mammogram in one year. (Code:[id number]-U-LHB)

BI-RADS CATEGORY  1: Negative.

## 2019-09-07 ENCOUNTER — Encounter (HOSPITAL_COMMUNITY): Payer: Self-pay

## 2019-09-08 ENCOUNTER — Other Ambulatory Visit (HOSPITAL_COMMUNITY): Payer: Self-pay | Admitting: Interventional Radiology

## 2019-09-08 ENCOUNTER — Ambulatory Visit (HOSPITAL_COMMUNITY)
Admission: RE | Admit: 2019-09-08 | Discharge: 2019-09-08 | Disposition: A | Discharge status: Home / Self Care | Payer: MEDICARE | Source: Ambulatory Visit | Attending: Interventional Radiology | Admitting: Interventional Radiology

## 2019-09-08 ENCOUNTER — Other Ambulatory Visit: Payer: Self-pay

## 2019-09-08 DIAGNOSIS — I671 Cerebral aneurysm, nonruptured: Secondary | ICD-10-CM

## 2019-09-08 NOTE — Progress Notes (Signed)
Chief Complaint: Patient was seen in consultation today for left ICA aneurysm.  Referring Physician(s): Phillips Odor A  Supervising Physician: Luanne Bras  Patient Status: [facility] - Out-pt  History of Present Illness: Tanya [name] is a 68 y.o. female with a past medical history as below, with pertinent past medical history including hypertension, hyperlipidemia, CAD, GERD, fatty liver disease, hypothyroidism, insomnia, and anhedonia. She is known to [facility] and has been followed by Dr. Estanislado Pandy since 09/2019. She first presented to our department at the request of Dr. Merlene Laughter for management of an incidental finding of a left ICA aneurysm seen on MRI/MRA brain/head 06/29/2019. She underwent an image-guided diagnostic cerebral arteriogram 09/01/2019 by Dr. Estanislado Pandy which confirmed the presence of an intracranial left ICA aneurysm.  Diagnostic cerebral arteriogram 09/01/2019: 1. 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   Patient presents today for follow-up to discuss findings of recent diagnostic cerebral arteriogram 09/01/2019. Patient awake and alert sitting in chair. Accompanied by husband. Complains of intermittent daily right-sided headaches. States pain radiates down right neck. Describes headache as pulsatile. Complains of intermittent right-eye blurred vision. Denies weakness, numbness/tingling, dizziness, diplopia/curtain over eyes/complete blindness, hearing changes, tinnitus, or speech difficulty.  Currently taking Plavix 75 mg once daily and Aspirin 81 mg once daily.   Past Medical History:  Diagnosis Date   Anhedonia    CAD (coronary artery disease)    a. s/p DESx2 to proximal LAD in 06/2018 b. cath in 04/2019 showing 40% mid-LAD ISR with no significant stenosis along LM, LCx, or RCA   Cystitis    Elevated liver enzymes    Fatty liver    GERD  (gastroesophageal reflux disease)    Hyperlipidemia    Hypertension    Insomnia    Unspecified hypothyroidism    Vaginitis     Past Surgical History:  Procedure Laterality Date   CATARACT EXTRACTION W/PHACO Right 05/08/2019   Procedure: CATARACT EXTRACTION PHACO AND INTRAOCULAR LENS PLACEMENT ([place]);  Surgeon: Baruch Goldmann, MD;  Location: [facility] ORS;  Service: Ophthalmology;  Laterality: Right;  CDE: 10.02   COLONOSCOPY N/A 02/08/2016   Procedure: COLONOSCOPY;  Surgeon: [name], MD;  Location: [facility] ENDO SUITE;  Service: Endoscopy;  Laterality: N/A;  1200 - moved to 11:45 - Ann to notify   CORONARY STENT INTERVENTION N/A 06/06/2018   Procedure: CORONARY STENT INTERVENTION;  Surgeon: [name], [name], MD;  Location: [place] CV LAB;  Service: Cardiovascular;  Laterality: N/A;   INTRAVASCULAR PRESSURE WIRE/FFR STUDY N/A 06/06/2018   Procedure: INTRAVASCULAR PRESSURE WIRE/FFR STUDY;  Surgeon: [name], [name], MD;  Location: [place] CV LAB;  Service: Cardiovascular;  Laterality: N/A;   IR ANGIO INTRA EXTRACRAN SEL COM CAROTID INNOMINATE BILAT MOD SED  09/01/2019   IR ANGIO VERTEBRAL SEL VERTEBRAL BILAT MOD SED  09/01/2019   LEFT HEART CATH AND CORONARY ANGIOGRAPHY N/A 04/24/2019   Procedure: LEFT HEART CATH AND CORONARY ANGIOGRAPHY;  Surgeon: Belva Crome, MD;  Location: [place] CV LAB;  Service: Cardiovascular;  Laterality: N/A;   LEFT HEART CATHETERIZATION WITH CORONARY ANGIOGRAM N/A 11/19/2014   Procedure: LEFT HEART CATHETERIZATION WITH CORONARY ANGIOGRAM;  Surgeon: [name], MD;  Location: [facility] CATH LAB;  Service: Cardiovascular;  Laterality: N/A;   RIGHT/LEFT HEART CATH AND CORONARY ANGIOGRAPHY N/A 06/06/2018   Procedure: RIGHT/LEFT HEART CATH AND CORONARY ANGIOGRAPHY;  Surgeon: [name], [name], MD;  Location: [place]  CV LAB;  Service: Cardiovascular;  Laterality: N/A;    Allergies: Patient has no known allergies.  Medications: Prior to Admission medications    Medication Sig Start Date End Date Taking? Authorizing Provider  amLODipine (NORVASC) 2.5 MG tablet Take 1 tablet (2.5 mg total) by mouth daily. 08/03/19   [name], MD  Ascorbic Acid (VITAMIN C) 1000 MG tablet Take 1,000 mg by mouth daily.     [provider]  aspirin EC 81 MG tablet Take 81 mg by mouth at bedtime.    [provider]  Biotin 5000 MCG CAPS Take 5,000 mcg by mouth daily.    [provider]  busPIRone (BUSPAR) 5 MG tablet Take 1 tablet (5 mg total) by mouth 2 (two) times daily. 08/03/19   [name], MD  Carboxymethylcellulose Sodium (THERATEARS OP) Place 1 drop into both eyes 3 (three) times daily.    [provider]  cloNIDine (CATAPRES) 0.2 MG tablet Take one tablet by mouth at bedtime for blood pressure Patient taking differently: Take 0.2 mg by mouth at bedtime.  07/20/19   [name], MD  clopidogrel (PLAVIX) 75 MG tablet Take 1 tablet (75 mg total) by mouth daily. 02/25/19   Herminio Commons, MD  nitroGLYCERIN (NITROSTAT) 0.4 MG SL tablet Place 1 tablet (0.4 mg total) under the tongue every 5 (five) minutes as needed. Patient taking differently: Place 0.4 mg under the tongue every 5 (five) minutes x 3 doses as needed for chest pain.  06/06/18   Cheryln Manly, NP  Omega-3 Fatty Acids (FISH OIL) 1000 MG CAPS Take 1,000 mg by mouth daily.    [provider]  pantoprazole (PROTONIX) 40 MG tablet Take 1 tablet (40 mg total) by mouth daily. 02/25/19   Herminio Commons, MD  potassium chloride SA (K-DUR) 20 MEQ tablet Take 1 tablet (20 mEq total) by mouth daily. Patient not taking: Reported on 08/26/2019 06/30/19   TatShanon Brow, MD  potassium chloride SA (KLOR-CON) 20 MEQ tablet Take 1 tablet (20 mEq total) by mouth daily. 09/02/19   [name], MD  promethazine (PHENERGAN) 25 MG tablet Take 25 mg by mouth every 6 (six) hours as needed for nausea. 07/13/19   [provider]  rosuvastatin  (CRESTOR) 40 MG tablet Take 1 tablet (40 mg total) by mouth daily. 03/25/19   [name], MD  temazepam (RESTORIL) 15 MG capsule Take 1 capsule (15 mg total) by mouth at bedtime as needed for sleep. 08/03/19   [name], MD  topiramate (TOPAMAX) 25 MG tablet Take 50 mg by mouth at bedtime.  07/13/19   [provider]     Family History  Problem Relation Age of Onset   Dementia Mother    Heart disease Mother        heart attack    Hypertension Mother    Heart attack Father    Heart disease Father        heart attack    Hypertension Father     Social History   Socioeconomic History   Marital status: Married    Spouse name: Not on file   Number of children: Not on file   Years of education: Not on file   Highest education level: Not on file  Occupational History   Not on file  Social Needs   Financial resource strain: Not on file   Food insecurity    Worry: Not on file  Inability: Not on file   Transportation needs    Medical: Not on file    Non-medical: Not on file  Tobacco Use   Smoking status: Never Smoker   Smokeless tobacco: Never Used  Substance and Sexual Activity   Alcohol use: No    Alcohol/week: 0.0 standard drinks   Drug use: No   Sexual activity: Not on file  Lifestyle   Physical activity    Days per week: Not on file    Minutes per session: Not on file   Stress: Not on file  Relationships   Social connections    Talks on phone: Not on file    Gets together: Not on file    Attends religious service: Not on file    Active member of club or organization: Not on file    Attends meetings of clubs or organizations: Not on file    Relationship status: Not on file  Other Topics Concern   Not on file  Social History Narrative   Pt originally from the [place] been in [place] for 20 years       Review of Systems: A 12 point ROS discussed and pertinent positives are indicated in the HPI above.  All other  systems are negative.  Review of Systems  Constitutional: Negative for chills and fever.  HENT: Negative for hearing loss and tinnitus.   Eyes: Positive for visual disturbance.  Respiratory: Negative for shortness of breath and wheezing.   Cardiovascular: Negative for chest pain and palpitations.  Neurological: Positive for headaches. Negative for dizziness, speech difficulty, weakness and numbness.  Psychiatric/Behavioral: Negative for behavioral problems and confusion.    Physical Exam Constitutional:      General: She is not in acute distress.    Appearance: Normal appearance.  Pulmonary:     Effort: Pulmonary effort is normal. No respiratory distress.  Skin:    General: Skin is warm and dry.  Neurological:     Mental Status: She is alert and oriented to person, place, and time.  Psychiatric:        Mood and Affect: Mood normal.        Behavior: Behavior normal.      Imaging: Ir Angio Intra Extracran Sel Com Carotid Innominate Bilat Mod Sed  Result Date: 09/02/2019 CLINICAL DATA:  New onset severe headaches. Discovery of a left internal carotid artery posterior wall intracranial aneurysm. EXAM: BILATERAL COMMON CAROTID AND INNOMINATE ANGIOGRAPHY COMPARISON:  MRI MRA of the brain of June 29, 2019. MEDICATIONS: Heparin 1000 units IV; no antibiotic was administered within 1 hour of the procedure. ANESTHESIA/SEDATION: Versed 1 mg IV; Fentanyl 25 mcg IV Moderate Sedation Time:  22 minutes The patient was continuously monitored during the procedure by the interventional radiology nurse under my direct supervision. CONTRAST:  Omnipaque 300 approximately 60 mL. FLUOROSCOPY TIME:  Fluoroscopy Time: 7 minutes 12 seconds (879 mGy). COMPLICATIONS: None immediate. TECHNIQUE: Informed written consent was obtained from the patient after a thorough discussion of the procedural risks, benefits and alternatives. All questions were addressed. Maximal Sterile Barrier Technique was utilized  including caps, mask, sterile gowns, sterile gloves, sterile drape, hand hygiene and skin antiseptic. A timeout was performed prior to the initiation of the procedure. The right groin was prepped and draped in the usual sterile fashion. Thereafter using modified Seldinger technique, transfemoral access into the right common femoral artery was obtained without difficulty. Over a 0.035 inch guidewire, a 5 French Pinnacle sheath was inserted. Through this, and also over 0.035  inch guidewire, a 5 [place] JB 1 catheter was advanced to the aortic arch region and selectively positioned in the right common carotid artery, the right vertebral artery, the left common carotid artery and the left vertebral artery. FINDINGS: The right vertebral artery origin is widely patent. The vessel is seen to opacify to the cranial skull base. Wide patency is seen of the right vertebrobasilar junction and the right posterior-inferior cerebellar artery. The opacified basilar artery, the posterior cerebral arteries, the superior cerebellar arteries and the anterior-inferior cerebellar arteries demonstrate wide patency into the capillary and venous phases. Unopacified blood is seen in the basilar artery from the contralateral vertebral artery. The right common carotid arteriogram demonstrates the right external carotid artery and its major branches to be widely patent. The right internal carotid artery at the bulb to the cranial skull base is widely patent. The petrous, cavernous and the supraclinoid segments are widely patent. The right middle cerebral artery and the right anterior cerebral artery opacify into the capillary and venous phases. Cross-filling via the anterior communicating artery of the left anterior cerebral A2 segment and distally is seen. An infundibulum is seen in the region of the right posterior communicating artery. The right middle cerebral artery and the right anterior cerebral artery opacify into the capillary and  venous phases. Transient filling via the anterior communicating artery of the left anterior cerebral A2 segment is seen. The origin of the left vertebral artery is widely patent. The vessel demonstrates moderate tortuosity proximally. More distally the vessel is seen to opacify to the cranial skull base. Wide patency is seen of the left vertebrobasilar junction and the left posterior-inferior cerebellar artery. The basilar artery, the posterior cerebral arteries, the superior cerebellar arteries and the anterior-inferior cerebellar arteries demonstrate opacification into the capillary and venous phases. The left common carotid arteriogram demonstrates the left external carotid artery and its major branches to be widely patent. The left internal carotid artery at the bulb has a mild circumferential narrowing due to atherosclerotic plaque. More distally the vessel is seen to opacify to the cranial skull base. The petrous, cavernous and supraclinoid segments are widely patent. Arising in the left posterior communicating artery region is a bulbous outpouching measuring approximately 3.5 mm x 3.2 mm. A linear vascular projection is seen emanating from the region of the neck of the aneurysm. The left middle cerebral artery, and the left anterior cerebral artery opacify into the capillary and venous phases. 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 PLAN: Findings reviewed with patient and the spouse. Consultation scheduled with me to discuss management of this bulbous intracranial aneurysm. Electronically Signed   By: Luanne Bras M.D.   On: 09/01/2019 12:49   Ir Angio Vertebral Sel Vertebral Bilat Mod Sed  Result Date: 09/01/2019 CLINICAL DATA:  New onset severe headaches. Discovery of a left internal carotid artery posterior wall intracranial aneurysm.  EXAM: BILATERAL COMMON CAROTID AND  INNOMINATE ANGIOGRAPHY  COMPARISON:  MRI MRA of the brain of June 29, 2019.  MEDICATIONS: Heparin 1000 units IV; no antibiotic was administered within 1 hour of the procedure.  ANESTHESIA/SEDATION: Versed 1 mg IV; Fentanyl 25 mcg IV  Moderate Sedation Time:  22 minutes  The patient was continuously monitored during the procedure by the interventional radiology nurse under my direct supervision.  CONTRAST:  Omnipaque 300 approximately 60 mL.  FLUOROSCOPY TIME:  Fluoroscopy Time: 7 minutes 12 seconds (879  mGy).  COMPLICATIONS: None immediate.  TECHNIQUE: Informed written consent was obtained from the patient after a thorough discussion of the procedural risks, benefits and alternatives. All questions were addressed. Maximal Sterile Barrier Technique was utilized including caps, mask, sterile gowns, sterile gloves, sterile drape, hand hygiene and skin antiseptic. A timeout was performed prior to the initiation of the procedure.  The right groin was prepped and draped in the usual sterile fashion. Thereafter using modified Seldinger technique, transfemoral access into the right common femoral artery was obtained without difficulty. Over a 0.035 inch guidewire, a 5 French Pinnacle sheath was inserted. Through this, and also over 0.035 inch guidewire, a 5 [place] JB 1 catheter was advanced to the aortic arch region and selectively positioned in the right common carotid artery, the right vertebral artery, the left common carotid artery and the left vertebral artery.  FINDINGS: The right vertebral artery origin is widely patent.  The vessel is seen to opacify to the cranial skull base. Wide patency is seen of the right vertebrobasilar junction and the right posterior-inferior cerebellar artery.  The opacified basilar artery, the posterior cerebral arteries, the superior cerebellar arteries and the anterior-inferior cerebellar arteries demonstrate wide patency into the capillary and venous phases.  Unopacified blood is seen in the basilar artery from the contralateral vertebral artery.  The right common carotid arteriogram demonstrates the right external carotid artery and its major branches to be widely patent.  The right internal carotid artery at the bulb to the cranial skull base is widely patent. The petrous, cavernous and the supraclinoid segments are widely patent.  The right middle cerebral artery and the right anterior cerebral artery opacify into the capillary and venous phases. Cross-filling via the anterior communicating artery of the left anterior cerebral A2 segment and distally is seen.  An infundibulum is seen in the region of the right posterior communicating artery. The right middle cerebral artery and the right anterior cerebral artery opacify into the capillary and venous phases. Transient filling via the anterior communicating artery of the left anterior cerebral A2 segment is seen.  The origin of the left vertebral artery is widely patent.  The vessel demonstrates moderate tortuosity proximally. More distally the vessel is seen to opacify to the cranial skull base. Wide patency is seen of the left vertebrobasilar junction and the left posterior-inferior cerebellar artery.  The basilar artery, the posterior cerebral arteries, the superior cerebellar arteries and the anterior-inferior cerebellar arteries demonstrate opacification into the capillary and venous phases.  The left common carotid arteriogram demonstrates the left external carotid artery and its major branches to be widely patent.  The left internal carotid artery at the bulb has a mild circumferential narrowing due to atherosclerotic plaque. More distally the vessel is seen to opacify to the cranial skull base. The petrous, cavernous and supraclinoid segments are widely patent.  Arising in the left posterior communicating artery region is a bulbous outpouching measuring approximately 3.5 mm x 3.2 mm. A linear vascular  projection is seen emanating from the region of the neck of the aneurysm.  The left middle cerebral artery, and the left anterior cerebral artery opacify into the capillary and venous phases.  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  PLAN: Findings reviewed with patient and the spouse. Consultation scheduled with me to discuss management of this bulbous intracranial aneurysm.   Electronically Signed   By: Corky Downs.D.  On: 09/01/2019 12:49    Labs:  CBC: Recent Labs    06/16/19 0549 06/29/19 0005 06/29/19 0946 09/01/19 0624  WBC 4.8 6.6 4.9 5.2  HGB 12.0 12.7 13.1 12.7  HCT 36.1 37.1 39.9 37.6  PLT 321 357 355 319    COAGS: Recent Labs    06/15/19 1442 06/29/19 0005 09/01/19 0624  INR 1.0 1.0 1.0  APTT 26 27  --     BMP: Recent Labs    06/29/19 0946 06/30/19 0456 07/13/19 1414 09/01/19 0624  NA 139 144 142 141  K 3.2* 3.5 4.2 2.9*  CL 107 114* 107 110  CO2 23 24 24 22   GLUCOSE 109* 95 204* 98  BUN 13 11 14 13   CALCIUM 9.0 8.9 9.7 9.7  CREATININE 0.66 0.65 0.96 0.66  GFRNONAA >60 >60 60 >60  GFRAA >60 >60 70 >60    LIVER FUNCTION TESTS: Recent Labs    06/15/19 1442 06/16/19 0549 06/29/19 0005 06/29/19 0946 07/13/19 1414  BILITOT 0.6 0.7 0.7 0.8 0.4  AST 28 22 27 23 21   ALT 21 19 19 17 14   ALKPHOS 54 44 55 48  --   PROT 7.8 6.8 7.5 7.0 7.2  ALBUMIN 4.5 3.8 4.5 4.1  --      Assessment and Plan:  Left ICA aneurysm. Dr. Estanislado Pandy was present for consultation. Discussed symptoms. Discussed findings of diagnostic cerebral arteriogram, explaining that left ICA aneurysm is an incidental finding (not the cause of her right-sided headaches or right-sided blurred vision). Discussed nature of aneurysms, including risks of rupture. Explained that there are two management options moving forward- either conservative management  including routine imaging scans to monitor for changes, or with an endovascular embolization procedure. Explained procedure, including risks and benefits. Patient expresses desire to move forward with procedure.  Plan for follow-up with an image-guided cerebral arteriogram with intent to treat left ICA aneurysm, first available in 2021. Informed patient that our schedulers will call her to set up this procedure. Instructed patient to continue taking Plavix 75 mg once daily and Aspirin 81 mg once daily.  All questions answered and concerns addressed. Patient and husband convey understanding and agree with plan.  Thank you for this interesting consult.  I greatly enjoyed meeting Beyonka A Tabak and look forward to participating in their care.  A copy of this report was sent to the requesting provider on this date.  Electronically Signed: Earley Abide, PA-C 09/08/2019, 9:53 AM   I spent a total of 40 Minutes in face to face in clinical consultation, greater than 50% of which was counseling/coordinating care for left ICA aneurysm.

## 2019-09-18 ENCOUNTER — Ambulatory Visit (INDEPENDENT_AMBULATORY_CARE_PROVIDER_SITE_OTHER): Payer: Medicare Other | Admitting: Cardiovascular Disease

## 2019-09-18 ENCOUNTER — Other Ambulatory Visit: Payer: Self-pay

## 2019-09-18 ENCOUNTER — Other Ambulatory Visit: Payer: Self-pay | Admitting: Family Medicine

## 2019-09-18 ENCOUNTER — Encounter: Payer: Self-pay | Admitting: Cardiovascular Disease

## 2019-09-18 VITALS — BP 135/84 | HR 66 | Ht 64.0 in | Wt 138.0 lb

## 2019-09-18 DIAGNOSIS — I25118 Atherosclerotic heart disease of native coronary artery with other forms of angina pectoris: Secondary | ICD-10-CM | POA: Diagnosis not present

## 2019-09-18 DIAGNOSIS — E785 Hyperlipidemia, unspecified: Secondary | ICD-10-CM | POA: Diagnosis not present

## 2019-09-18 DIAGNOSIS — Z955 Presence of coronary angioplasty implant and graft: Secondary | ICD-10-CM

## 2019-09-18 DIAGNOSIS — I10 Essential (primary) hypertension: Secondary | ICD-10-CM

## 2019-09-18 DIAGNOSIS — F5101 Primary insomnia: Secondary | ICD-10-CM

## 2019-09-18 MED ORDER — BUSPIRONE HCL 5 MG PO TABS: 5.0000 mg | ORAL_TABLET | Freq: Two times a day (BID) | ORAL | 0 refills | Status: DC

## 2019-09-18 MED ORDER — AMLODIPINE BESYLATE 2.5 MG PO TABS: 2.5000 mg | ORAL_TABLET | Freq: Every day | ORAL | 0 refills | Status: DC

## 2019-09-18 MED ORDER — ROSUVASTATIN CALCIUM 40 MG PO TABS: 40.0000 mg | ORAL_TABLET | Freq: Every day | ORAL | 0 refills | Status: DC

## 2019-09-18 MED ORDER — CLONIDINE HCL 0.2 MG PO TABS: ORAL_TABLET | ORAL | 0 refills | Status: DC

## 2019-09-18 MED ORDER — TEMAZEPAM 15 MG PO CAPS: 15.0000 mg | ORAL_CAPSULE | Freq: Every evening | ORAL | 1 refills | Status: DC | PRN

## 2019-09-18 NOTE — Progress Notes (Signed)
SUBJECTIVE: The patient presents routine follow-up.  Past medical history includes coronary artery disease.  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  She underwent cardiac catheterization on 04/24/2019 which demonstrated 40% eccentric mid LAD in-stent restenosis with no high-grade obstruction.  Remaining epicardial coronary arteries were normal.  It was recommended dual antiplatelet therapy be continued beyond 12 months due to overlapping stents in the LAD.  Echocardiogram on 06/29/2019 demonstrated normal LV systolic function, EF 55 to 60%.  There was grade 1 diastolic dysfunction and mild aortic valve sclerosis without stenosis.  She has occasional chest pains which are atypical.  She only has shortness of breath if she walks for very long distances.  She also complains of right-sided headaches.    Review of Systems: As per "subjective", otherwise negative.  No Known Allergies  Current Outpatient Medications  Medication Sig Dispense Refill  . amLODipine (NORVASC) 2.5 MG tablet Take 1 tablet (2.5 mg total) by mouth daily. 30 tablet 1  . Ascorbic Acid (VITAMIN C) 1000 MG tablet Take 1,000 mg by mouth daily.     Marland Kitchen aspirin EC 81 MG tablet Take 81 mg by mouth at bedtime.    . Biotin 5000 MCG CAPS Take 5,000 mcg by mouth daily.    . busPIRone (BUSPAR) 5 MG tablet Take 1 tablet (5 mg total) by mouth 2 (two) times daily. 180 tablet 0  . Carboxymethylcellulose Sodium (THERATEARS OP) Place 1 drop into both eyes 3 (three) times daily.    . cloNIDine (CATAPRES) 0.2 MG tablet Take one tablet by mouth at bedtime for blood pressure (Patient taking differently: Take 0.2 mg by mouth at bedtime. ) 90 tablet 1  . clopidogrel (PLAVIX) 75 MG tablet Take 1 tablet (75 mg total) by mouth daily. 90 tablet 3  . nitroGLYCERIN (NITROSTAT) 0.4 MG SL tablet Place 1 tablet (0.4 mg total)  under the tongue every 5 (five) minutes as needed. (Patient taking differently: Place 0.4 mg under the tongue every 5 (five) minutes x 3 doses as needed for chest pain. ) 25 tablet 2  . Omega-3 Fatty Acids (FISH OIL) 1000 MG CAPS Take 1,000 mg by mouth daily.    . pantoprazole (PROTONIX) 40 MG tablet Take 1 tablet (40 mg total) by mouth daily. 90 tablet 3  . potassium chloride SA (KLOR-CON) 20 MEQ tablet Take 1 tablet (20 mEq total) by mouth daily. 30 tablet 3  . promethazine (PHENERGAN) 25 MG tablet Take 25 mg by mouth every 6 (six) hours as needed for nausea.    . rosuvastatin (CRESTOR) 40 MG tablet Take 1 tablet (40 mg total) by mouth daily. 90 tablet 1  . temazepam (RESTORIL) 15 MG capsule Take 1 capsule (15 mg total) by mouth at bedtime as needed for sleep. 30 capsule 3  . topiramate (TOPAMAX) 25 MG tablet Take 50 mg by mouth at bedtime.      No current facility-administered medications for this visit.    Past Medical History:  Diagnosis Date  . Anhedonia   . CAD (coronary artery disease)    a. s/p DESx2 to proximal LAD in 06/2018 b. cath in 04/2019 showing 40% mid-LAD ISR with no significant stenosis along LM, LCx, or RCA  . Cystitis   . Elevated liver enzymes   . Fatty liver   . GERD (gastroesophageal reflux disease)   . Hyperlipidemia   . Hypertension   .  Insomnia   . Unspecified hypothyroidism   . Vaginitis     Past Surgical History:  Procedure Laterality Date  . CATARACT EXTRACTION W/PHACO Right 05/08/2019   Procedure: CATARACT EXTRACTION PHACO AND INTRAOCULAR LENS PLACEMENT (IOC);  Surgeon: Baruch Goldmann, MD;  Location: [facility];  Service: Ophthalmology;  Laterality: Right;  CDE: 10.02  . COLONOSCOPY N/A 02/08/2016   Procedure: COLONOSCOPY;  Surgeon: [name], MD;  [facility];  Service: Endoscopy;  Laterality: N/A;  1200 - moved to 11:45 - Ann to notify  . CORONARY STENT INTERVENTION N/A 06/06/2018   Procedure: CORONARY STENT INTERVENTION;  Surgeon: [name],  [name], MD;  Location: [place] [facility];  Service: Cardiovascular;  Laterality: N/A;  . INTRAVASCULAR PRESSURE WIRE/FFR STUDY N/A 06/06/2018   Procedure: INTRAVASCULAR PRESSURE WIRE/FFR STUDY;  Surgeon: [name], [name], MD;  Location: [place] [facility];  Service: Cardiovascular;  Laterality: N/A;  . IR ANGIO INTRA EXTRACRAN SEL COM CAROTID INNOMINATE BILAT MOD SED  09/01/2019  . IR ANGIO VERTEBRAL SEL VERTEBRAL BILAT MOD SED  09/01/2019  . LEFT HEART CATH AND CORONARY ANGIOGRAPHY N/A 04/24/2019   Procedure: LEFT HEART CATH AND CORONARY ANGIOGRAPHY;  Surgeon: Belva Crome, MD;  Location: [place] [facility];  Service: Cardiovascular;  Laterality: N/A;  . LEFT HEART CATHETERIZATION WITH CORONARY ANGIOGRAM N/A 11/19/2014   Procedure: LEFT HEART CATHETERIZATION WITH CORONARY ANGIOGRAM;  Surgeon: [name], MD;  Location: [facility];  Service: Cardiovascular;  Laterality: N/A;  . RIGHT/LEFT HEART CATH AND CORONARY ANGIOGRAPHY N/A 06/06/2018   Procedure: RIGHT/LEFT HEART CATH AND CORONARY ANGIOGRAPHY;  Surgeon: [name], [name], MD;  Location: [place] [facility];  Service: Cardiovascular;  Laterality: N/A;    Social History   Socioeconomic History  . Marital status: Married    Spouse name: Not on file  . Number of children: Not on file  . Years of education: Not on file  . Highest education level: Not on file  Occupational History  . Not on file  Tobacco Use  . Smoking status: Never Smoker  . Smokeless tobacco: Never Used  Substance and Sexual Activity  . Alcohol use: No    Alcohol/week: 0.0 standard drinks  . Drug use: No  . Sexual activity: Not on file  Other Topics Concern  . Not on file  Social History Narrative   Pt originally from the [place] been in [place] for 20 years     Social Determinants of Health   Financial Resource Strain:   . Difficulty of Paying Living Expenses: Not on file  Food Insecurity:   . Worried About Charity fundraiser in the Last Year: Not on file  .  Ran Out of Food in the Last Year: Not on file  Transportation Needs:   . Lack of Transportation (Medical): Not on file  . Lack of Transportation (Non-Medical): Not on file  Physical Activity:   . Days of Exercise per Week: Not on file  . Minutes of Exercise per Session: Not on file  Stress:   . Feeling of Stress : Not on file  Social Connections:   . Frequency of Communication with Friends and Family: Not on file  . Frequency of Social Gatherings with Friends and Family: Not on file  . Attends Religious Services: Not on file  . Active Member of Clubs or Organizations: Not on file  . Attends Archivist Meetings: Not on file  . Marital Status: Not on file  Intimate Partner Violence:   . Fear of Current or Ex-Partner: Not on file  . Emotionally Abused: Not on file  . Physically Abused: Not on file  . Sexually Abused: Not on file     Vitals:   09/18/19 0823  BP: 135/84  Pulse: 66  SpO2: 99%  Weight: 138 lb (62.6 kg)  Height: 5\' 4"  (1.626 m)    Wt Readings from Last 3 Encounters:  09/18/19 138 lb (62.6 kg)  09/01/19 130 lb (59 kg)  08/04/19 136 lb 0.6 oz (61.7 kg)     PHYSICAL EXAM General: NAD HEENT: Normal. Neck: No JVD, no thyromegaly. Lungs: Clear to auscultation bilaterally with normal respiratory effort. CV: Regular rate and rhythm, normal S1/S2, no S3/S4, no murmur. No pretibial or periankle edema.  No carotid bruit.   Abdomen: Soft, nontender, no distention.  Neurologic: Alert and oriented.  Psych: Normal affect. Skin: Normal. Musculoskeletal: No gross deformities.      Labs: Lab Results  Component Value Date/Time   K 2.9 (L) 09/01/2019 06:24 AM   BUN 13 09/01/2019 06:24 AM   CREATININE 0.66 09/01/2019 06:24 AM   CREATININE 0.96 07/13/2019 02:14 PM   ALT 14 07/13/2019 02:14 PM   TSH 1.23 02/24/2019 09:58 AM   HGB 12.7 09/01/2019 06:24 AM     Lipids: Lab Results  Component Value Date/Time   LDLCALC 48 06/29/2019 09:46 AM   LDLCALC 80  02/24/2019 09:58 AM   CHOL 107 06/29/2019 09:46 AM   TRIG 56 06/29/2019 09:46 AM   HDL 48 06/29/2019 09:46 AM       ASSESSMENT AND PLAN: 1.  Coronary artery disease: She underwent cardiac catheterization on 04/24/2019 which demonstrated 40% eccentric mid LAD in-stent restenosis with no high-grade obstruction.  Remaining epicardial coronary arteries were normal.  It was recommended dual antiplatelet therapy be continued beyond 12 months due to overlapping stents in the LAD.  Stents were placed on 06/06/2018.  Continue aspirin, clopidogrel, and rosuvastatin.  2.  Hypertension: Blood pressure is normal.  No changes to therapy.  3.  Hyperlipidemia: Lipids reviewed above with LDL found to be at goal, 48 on 06/21/2019.  Continue rosuvastatin.    Disposition: Follow up 6 months virtual visit   [name], M.D., F.A.C.C.

## 2019-09-18 NOTE — Patient Instructions (Signed)
Medication Instructions:  Continue all current medications.   Labwork: none  Testing/Procedures: none  Follow-Up: 6 months   Any Other Special Instructions Will Be Listed Below (If Applicable).   If you need a refill on your cardiac medications before your next appointment, please call your pharmacy.  

## 2019-09-23 ENCOUNTER — Other Ambulatory Visit: Payer: Self-pay

## 2019-09-23 MED ORDER — PANTOPRAZOLE SODIUM 40 MG PO TBEC: 40.0000 mg | DELAYED_RELEASE_TABLET | Freq: Every day | ORAL | 3 refills | Status: DC

## 2019-09-23 MED ORDER — CLOPIDOGREL BISULFATE 75 MG PO TABS: 75.0000 mg | ORAL_TABLET | Freq: Every day | ORAL | 3 refills | Status: DC

## 2019-09-28 ENCOUNTER — Other Ambulatory Visit (HOSPITAL_COMMUNITY): Payer: Self-pay | Admitting: Interventional Radiology

## 2019-09-28 ENCOUNTER — Telehealth (HOSPITAL_COMMUNITY): Payer: Self-pay | Admitting: Radiology

## 2019-09-28 DIAGNOSIS — I671 Cerebral aneurysm, nonruptured: Secondary | ICD-10-CM

## 2019-09-28 NOTE — Telephone Encounter (Signed)
Called pt to set up brain aneurysm tx with Deveshwar. Pt seemed confused about this and stated she wanted to talk to her husband because she was having problems right now. I will call her spouse Delfino Lovett). JM

## 2019-10-07 ENCOUNTER — Telehealth: Payer: Self-pay | Admitting: *Deleted

## 2019-10-07 DIAGNOSIS — M8580 Other specified disorders of bone density and structure, unspecified site: Secondary | ICD-10-CM

## 2019-10-07 DIAGNOSIS — Z780 Asymptomatic menopausal state: Secondary | ICD-10-CM

## 2019-10-07 NOTE — Telephone Encounter (Signed)
ordered

## 2019-10-07 NOTE — Telephone Encounter (Signed)
Pt needs updated bone density order so I can schedule

## 2019-10-12 ENCOUNTER — Ambulatory Visit (HOSPITAL_COMMUNITY)
Admission: RE | Admit: 2019-10-12 | Discharge: 2019-10-12 | Disposition: A | Discharge status: Home / Self Care | Payer: Medicare Other | Source: Ambulatory Visit | Attending: Family Medicine | Admitting: Family Medicine

## 2019-10-12 ENCOUNTER — Other Ambulatory Visit: Payer: Self-pay

## 2019-10-12 ENCOUNTER — Other Ambulatory Visit (HOSPITAL_COMMUNITY): Payer: Medicare Other

## 2019-10-12 DIAGNOSIS — M8580 Other specified disorders of bone density and structure, unspecified site: Secondary | ICD-10-CM | POA: Diagnosis present

## 2019-10-12 DIAGNOSIS — Z780 Asymptomatic menopausal state: Secondary | ICD-10-CM | POA: Insufficient documentation

## 2019-10-12 DIAGNOSIS — Z13820 Encounter for screening for osteoporosis: Secondary | ICD-10-CM | POA: Insufficient documentation

## 2019-10-20 ENCOUNTER — Other Ambulatory Visit: Payer: Self-pay

## 2019-10-20 ENCOUNTER — Ambulatory Visit (INDEPENDENT_AMBULATORY_CARE_PROVIDER_SITE_OTHER): Payer: Medicare Other | Admitting: Family Medicine

## 2019-10-20 VITALS — BP 135/84 | Ht 64.0 in | Wt 138.0 lb

## 2019-10-20 DIAGNOSIS — E876 Hypokalemia: Secondary | ICD-10-CM | POA: Diagnosis not present

## 2019-10-20 DIAGNOSIS — E782 Mixed hyperlipidemia: Secondary | ICD-10-CM | POA: Diagnosis not present

## 2019-10-20 DIAGNOSIS — I10 Essential (primary) hypertension: Secondary | ICD-10-CM

## 2019-10-20 DIAGNOSIS — R82998 Other abnormal findings in urine: Secondary | ICD-10-CM

## 2019-10-20 DIAGNOSIS — I671 Cerebral aneurysm, nonruptured: Secondary | ICD-10-CM | POA: Diagnosis not present

## 2019-10-20 DIAGNOSIS — Z1231 Encounter for screening mammogram for malignant neoplasm of breast: Secondary | ICD-10-CM

## 2019-10-20 NOTE — Assessment & Plan Note (Signed)
REPT NON FAST CHEM 7 AND Egfr IN AM

## 2019-10-20 NOTE — Assessment & Plan Note (Signed)
[place]

## 2019-10-20 NOTE — Progress Notes (Signed)
Virtual Visit via Telephone Note  I connected with Tanya [name] on 10/20/19 at  9:40 AM EST by telephone and verified that I am speaking with the correct person using two identifiers.  Location: Patient: home Provider: office   I discussed the limitations, risks, security and privacy concerns of performing an evaluation and management service by telephone and the availability of in person appointments. I also discussed with the patient that there may be a patient responsible charge related to this service. The patient expressed understanding and agreed to proceed.   History of Present Illness: F/U chronic problems, medication review, and refill medication when necessary. Review most recent labs and order labs which are due Review preventive health and update with necessary referrals or immunizations as indicated C/O intermittent redness and warmth of right side of face , denies itch does have a rash in affected area Has questions about the need for having aneurysm repaired and wants to get confirmation from PCP before she goes through with this Report of imaging study  Reviewed with patient and she is satisfied of the need to follow through on repair Denies recent fever or chills. Denies sinus pressure, nasal congestion, ear pain or sore throat. Denies chest congestion, productive cough or wheezing. Denies chest pains, palpitations and leg swelling Denies abdominal pain, nausea, vomiting,diarrhea or constipation.   Denies dysuria, frequency,.c/o foamy urine Denies joint pain, swelling and limitation in mobility. Chronic headache history Denies depression, anxiety or insomnia.         Observations/Objective: BP 135/84   Ht _0  (1.626 m)   Wt 138 lb (62.6 kg)   BMI 23.69 kg/m  Good communication with no confusion and intact memory. Alert and oriented x 3 No signs of respiratory distress during speech    Assessment and Plan:  Hypokalemia REPT NON FAST CHEM 7 AND  Egfr IN AM  Foamy urine CHECK CCUA  Aneurysm, carotid artery, internal Reviewed report with patient and advised she have procedure to correct, she agrees  Essential hypertension Controlled, no change in medication   Hyperlipemia Hyperlipidemia:Low fat diet discussed and encouraged. Controlled, no change in medication Updated lab needed at/ before next visit.   Lipid Panel  Lab Results  Component Value Date   CHOL 107 06/29/2019   HDL 48 06/29/2019   LDLCALC 48 06/29/2019   TRIG 56 06/29/2019   CHOLHDL 2.2 06/29/2019        Follow Up Instructions:    I discussed the assessment and treatment plan with the patient. The patient was provided an opportunity to ask questions and all were answered. The patient agreed with the plan and demonstrated an understanding of the instructions.   The patient was advised to call back or seek an in-person evaluation if the symptoms worsen or if the condition fails to improve as anticipated.  I provided 15 minutes of non-face-to-face time during this encounter.   Tula Nakayama, MD

## 2019-10-20 NOTE — Patient Instructions (Signed)
ANNUAL PHYSICAL EXAM IN OFFICE WITH MD IN 3 MONTHS, CALL IF YOU NEED ME SOONER  PLEASE SCHEDULE MARCH MAMMOGRAM AT CHECKOUT (MORNING APPT BEFORE 12 DUE TO WORK SCHEDULE)   NON FAST CHEM 7 AND EGFR  AND CCUA IN LAB TOMORROW, PT WILL COLLECT D/C SUMMARY, ORDERS, AMND F/U INFO IN PERSON TOMORROW MORNING    PLEASE DO GO AND HAVE YOUR ANEURYSM REPAIRED, VERY IMPORTANT YOU DO THIS  It is important that you exercise regularly at least 30 minutes 5 times a week. If you develop chest pain, have severe difficulty breathing, or feel very tired, stop exercising immediately and seek medical attention   Thanks for choosing [facility], we consider it a privelige to serve you. bEST FOR 2021!

## 2019-10-21 LAB — URINALYSIS
Bilirubin Urine: NEGATIVE
Glucose, UA: NEGATIVE
Hgb urine dipstick: NEGATIVE
Ketones, ur: NEGATIVE
Leukocytes,Ua: NEGATIVE
Nitrite: NEGATIVE
Protein, ur: NEGATIVE
Specific Gravity, Urine: 1.008 (ref 1.001–1.03)
pH: 7 (ref 5.0–8.0)

## 2019-10-21 LAB — BASIC METABOLIC PANEL WITH GFR
BUN: 18 mg/dL (ref 7–25)
CO2: 27 mmol/L (ref 20–32)
Calcium: 10 mg/dL (ref 8.6–10.4)
Chloride: 109 mmol/L (ref 98–110)
Creat: 0.68 mg/dL (ref 0.50–0.99)
GFR, Est African American: 103 mL/min/{1.73_m2} (ref >=60)
GFR, Est Non African American: 89 mL/min/{1.73_m2} (ref >=60)
Glucose, Bld: 118 mg/dL (ref 65–139)
Potassium: 3.5 mmol/L (ref 3.5–5.3)
Sodium: 143 mmol/L (ref 135–146)

## 2019-10-22 ENCOUNTER — Telehealth (HOSPITAL_COMMUNITY): Payer: Self-pay

## 2019-10-22 NOTE — Telephone Encounter (Signed)
Pt's husband called to schedule tx for his wife. I got all of his contact info and date that they would like to schedule and gave to [place]. I informed him that she would give him a call to schedule. AW

## 2019-10-23 ENCOUNTER — Other Ambulatory Visit (HOSPITAL_COMMUNITY): Payer: Self-pay | Admitting: Interventional Radiology

## 2019-10-25 ENCOUNTER — Encounter: Payer: Self-pay | Admitting: Family Medicine

## 2019-10-25 DIAGNOSIS — I671 Cerebral aneurysm, nonruptured: Secondary | ICD-10-CM | POA: Insufficient documentation

## 2019-10-25 NOTE — Assessment & Plan Note (Signed)
Reviewed report with patient and advised she have procedure to correct, she agrees

## 2019-10-25 NOTE — Assessment & Plan Note (Addendum)
Hyperlipidemia:Low fat diet discussed and encouraged. Controlled, no change in medication Updated lab needed at/ before next visit.   Lipid Panel  Lab Results  Component Value Date   CHOL 107 06/29/2019   HDL 48 06/29/2019   LDLCALC 48 06/29/2019   TRIG 56 06/29/2019   CHOLHDL 2.2 06/29/2019

## 2019-10-25 NOTE — Assessment & Plan Note (Signed)
Controlled, no change in medication  

## 2019-10-26 ENCOUNTER — Other Ambulatory Visit: Payer: Self-pay

## 2019-10-26 ENCOUNTER — Telehealth: Payer: Self-pay | Admitting: *Deleted

## 2019-10-26 MED ORDER — AMLODIPINE BESYLATE 2.5 MG PO TABS: 2.5000 mg | ORAL_TABLET | Freq: Every day | ORAL | 5 refills | Status: DC

## 2019-10-26 NOTE — Telephone Encounter (Signed)
Med sent to [facility] [place]

## 2019-10-26 NOTE — Telephone Encounter (Signed)
Pt is out of amlodopine and needs this sent to [facility] in [place] 

## 2019-11-05 ENCOUNTER — Telehealth (HOSPITAL_COMMUNITY): Payer: Self-pay

## 2019-11-05 NOTE — Telephone Encounter (Signed)
Pt's husband called with questions regarding upcoming procedure. He wanted to know what time her COVID test was scheduled. Answered all questions. AW

## 2019-11-11 NOTE — Progress Notes (Deleted)
Office Visit Note  Patient: Tanya [name]             Date of Birth: 12-07-1950           MRN: [id number]             PCP: [name], MD Referring: [name], MD Visit Date: 11/13/2019 Occupation: @GUAROCC @  Subjective:  No chief complaint on file.   History of Present Illness: Tanya [name] is a 69 y.o. female ***   Activities of Daily Living:  Patient reports morning stiffness for *** {minute/hour:19697}.   Patient {ACTIONS;DENIES/REPORTS:21021675::"Denies"} nocturnal pain.  Difficulty dressing/grooming: {ACTIONS;DENIES/REPORTS:21021675::"Denies"} Difficulty climbing stairs: {ACTIONS;DENIES/REPORTS:21021675::"Denies"} Difficulty getting out of chair: {ACTIONS;DENIES/REPORTS:21021675::"Denies"} Difficulty using hands for taps, buttons, cutlery, and/or writing: {ACTIONS;DENIES/REPORTS:21021675::"Denies"}  No Rheumatology ROS completed.   PMFS History:  Patient Active Problem List   Diagnosis Date Noted  . 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 Foamy urine 10/20/2019  . Sensory disturbance 06/29/2019  . Hospital discharge follow-up 06/21/2019  . Hypokalemia 06/15/2019  . Need for shingles vaccine 03/25/2019  . Educated about COVID-19 virus infection 02/15/2019  . CAD (coronary artery disease), native coronary artery 06/06/2018  . Headache 03/08/2018  . Primary osteoarthritis of both hands 02/13/2018  . Primary osteoarthritis of both knees 02/13/2018  . Anxiety 02/06/2017  . Pruritus 07/20/2016  . Arthritis 07/20/2016  . Tubular adenoma of colon 02/11/2016  . Coronary artery calcification seen on CAT scan - Moderate LAD lesion noted 11/16/2014  . Abnormal CT scan, chest 11/08/2014  . IGT (impaired glucose tolerance) 06/11/2014  . Osteopenia 01/27/2014  . Dyspnea 01/19/2014  . Microscopic hematuria 01/05/2013  . Urinary incontinence, mixed 01/05/2013  . GERD (gastroesophageal reflux disease) 12/16/2009  . Chest pain 03/01/2009  .  Hyperlipemia 04/09/2008  . Essential hypertension 04/09/2008  . Insomnia disorder 04/09/2008    Past Medical History:  Diagnosis Date  . Anhedonia   . CAD (coronary artery disease)    a. s/p DESx2 to proximal LAD in 06/2018 b. cath in 04/2019 showing 40% mid-LAD ISR with no significant stenosis along LM, LCx, or RCA  . Cystitis   . Elevated liver enzymes   . Fatty liver   . GERD (gastroesophageal reflux disease)   . Hyperlipidemia   . Hypertension   . Insomnia   . Unspecified hypothyroidism   . Vaginitis     Family History  Problem Relation Age of Onset  . Dementia Mother   . Heart disease Mother        heart attack   . Hypertension Mother   . Heart attack Father   . Heart disease Father        heart attack   . Hypertension Father    Past Surgical History:  Procedure Laterality Date  . CATARACT EXTRACTION W/PHACO Right 05/08/2019   Procedure: CATARACT EXTRACTION PHACO AND INTRAOCULAR LENS PLACEMENT (IOC);  Surgeon: Baruch Goldmann, MD;  Location: [facility];  Service: Ophthalmology;  Laterality: Right;  CDE: 10.02  . COLONOSCOPY N/A 02/08/2016   Procedure: COLONOSCOPY;  Surgeon: [name], MD;  [facility];  Service: Endoscopy;  Laterality: N/A;  1200 - moved to 11:45 - Ann to notify  . CORONARY STENT INTERVENTION N/A 06/06/2018   Procedure: CORONARY STENT INTERVENTION;  Surgeon: [name], [name], MD;  Location: [place] [facility];  Service: Cardiovascular;  Laterality: N/A;  . INTRAVASCULAR PRESSURE WIRE/FFR STUDY N/A 06/06/2018   Procedure: INTRAVASCULAR PRESSURE WIRE/FFR STUDY;  Surgeon: [name], [name],  MD;  Location: [place] [facility];  Service: Cardiovascular;  Laterality: N/A;  . IR ANGIO INTRA EXTRACRAN SEL COM CAROTID INNOMINATE BILAT MOD SED  09/01/2019  . IR ANGIO VERTEBRAL SEL VERTEBRAL BILAT MOD SED  09/01/2019  . LEFT HEART CATH AND CORONARY ANGIOGRAPHY N/A 04/24/2019   Procedure: LEFT HEART CATH AND CORONARY ANGIOGRAPHY;  Surgeon: Belva Crome, MD;   Location: [place] [facility];  Service: Cardiovascular;  Laterality: N/A;  . LEFT HEART CATHETERIZATION WITH CORONARY ANGIOGRAM N/A 11/19/2014   Procedure: LEFT HEART CATHETERIZATION WITH CORONARY ANGIOGRAM;  Surgeon: [name], MD;  Location: [facility] LAB;  Service: Cardiovascular;  Laterality: N/A;  . RIGHT/LEFT HEART CATH AND CORONARY ANGIOGRAPHY N/A 06/06/2018   Procedure: RIGHT/LEFT HEART CATH AND CORONARY ANGIOGRAPHY;  Surgeon: [name], [name], MD;  Location: [place] [facility];  Service: Cardiovascular;  Laterality: N/A;   Social History   Social History Narrative   Pt originally from the [place] been in [place] for 20 years     Immunization History  Administered Date(s) Administered  . Fluad Quad(high Dose 65+) 06/16/2019  . Influenza Whole 07/05/2008  . Influenza,inj,Quad PF,6+ Mos 07/17/2013, 08/01/2014, 05/24/2016  . Influenza-Unspecified 06/10/2017  . Pneumococcal Conjugate-13 11/08/2014  . Pneumococcal Polysaccharide-23 09/06/2004, 03/30/2016  . Td 01/21/2003  . Tdap 07/17/2013  . Zoster 01/12/2011  . Zoster Recombinat (Shingrix) 03/25/2019     Objective: Vital Signs: There were no vitals taken for this visit.   Physical Exam   Musculoskeletal Exam: ***  CDAI Exam: CDAI Score: -- Patient Global: --; Provider Global: -- Swollen: --; Tender: -- Joint Exam 11/13/2019   No joint exam has been documented for this visit   There is currently no information documented on the homunculus. Go to the Rheumatology activity and complete the homunculus joint exam.  Investigation: No additional findings.  Imaging: No results found.  Recent Labs: Lab Results  Component Value Date   WBC 5.2 09/01/2019   HGB 12.7 09/01/2019   PLT 319 09/01/2019   NA 143 10/21/2019   K 3.5 10/21/2019   CL 109 10/21/2019   CO2 27 10/21/2019   GLUCOSE 118 10/21/2019   BUN 18 10/21/2019   CREATININE 0.68 10/21/2019   BILITOT 0.4 07/13/2019   ALKPHOS 48 06/29/2019   AST 21  07/13/2019   ALT 14 07/13/2019   PROT 7.2 07/13/2019   ALBUMIN 4.1 06/29/2019   CALCIUM 10.0 10/21/2019   GFRAA 103 10/21/2019    Speciality Comments: No specialty comments available.  Procedures:  No procedures performed Allergies: Patient has no known allergies.   Assessment / Plan:     Visit Diagnoses: No diagnosis found.  Orders: No orders of the defined types were placed in this encounter.  No orders of the defined types were placed in this encounter.   Face-to-face time spent with patient was *** minutes. Greater than 50% of time was spent in counseling and coordination of care.  Follow-Up Instructions: No follow-ups on file.   Ofilia Neas, PA-C  Note - This record has been created using Dragon software.  Chart creation errors have been sought, but may not always  have been located. Such creation errors do not reflect on  the standard of medical care.

## 2019-11-13 ENCOUNTER — Ambulatory Visit: Payer: Medicare Other | Admitting: Rheumatology

## 2019-11-17 ENCOUNTER — Telehealth: Payer: Self-pay | Admitting: *Deleted

## 2019-11-17 ENCOUNTER — Other Ambulatory Visit: Payer: Self-pay

## 2019-11-17 DIAGNOSIS — F5101 Primary insomnia: Secondary | ICD-10-CM

## 2019-11-17 MED ORDER — TEMAZEPAM 15 MG PO CAPS: 15.0000 mg | ORAL_CAPSULE | Freq: Every evening | ORAL | 1 refills | Status: DC | PRN

## 2019-11-17 NOTE — Telephone Encounter (Signed)
Pt husband called requesting a refill on pt tamazepam sent to [facility] in [place]

## 2019-11-17 NOTE — Telephone Encounter (Signed)
Could you refill this for her?

## 2019-11-18 ENCOUNTER — Other Ambulatory Visit: Payer: Self-pay | Admitting: Family Medicine

## 2019-11-18 DIAGNOSIS — F5101 Primary insomnia: Secondary | ICD-10-CM

## 2019-11-18 MED ORDER — TEMAZEPAM 15 MG PO CAPS: 15.0000 mg | ORAL_CAPSULE | Freq: Every evening | ORAL | 0 refills | Status: DC | PRN

## 2019-11-19 ENCOUNTER — Other Ambulatory Visit: Payer: Self-pay

## 2019-11-19 ENCOUNTER — Other Ambulatory Visit: Payer: Self-pay | Admitting: Family Medicine

## 2019-11-19 ENCOUNTER — Encounter (HOSPITAL_COMMUNITY): Payer: Self-pay | Admitting: Vascular Surgery

## 2019-11-19 ENCOUNTER — Other Ambulatory Visit: Payer: Self-pay | Admitting: Radiology

## 2019-11-19 ENCOUNTER — Other Ambulatory Visit (HOSPITAL_COMMUNITY): Payer: Medicare Other

## 2019-11-19 ENCOUNTER — Encounter (HOSPITAL_COMMUNITY): Payer: Self-pay | Admitting: Interventional Radiology

## 2019-11-19 ENCOUNTER — Other Ambulatory Visit (HOSPITAL_COMMUNITY): Admission: RE | Admit: 2019-11-19 | Payer: Medicare Other | Source: Ambulatory Visit

## 2019-11-19 NOTE — Anesthesia Preprocedure Evaluation (Deleted)
Anesthesia Evaluation    Reviewed: Allergy & Precautions, Patient's Chart, lab work & pertinent test results  Airway        Dental   Pulmonary neg pulmonary ROS,           Cardiovascular hypertension, Pt. on medications + CAD       Neuro/Psych  Headaches, Anxiety    GI/Hepatic Neg liver ROS, GERD  Medicated,  Endo/Other  Hypothyroidism   Renal/GU negative Renal ROS     Musculoskeletal  (+) Arthritis ,   Abdominal   Peds  Hematology negative hematology ROS (+)   Anesthesia Other Findings   Reproductive/Obstetrics                            Echo:  1. Left ventricular ejection fraction, by visual estimation, is 55 to  60%. The left ventricle has normal function. There is no left ventricular  hypertrophy.  2. Left ventricular diastolic Doppler parameters are consistent with  impaired relaxation pattern of LV diastolic filling.  3. Global right ventricle has normal systolic function.The right  ventricular size is normal. No increase in right ventricular wall  thickness.  4. Left atrial size was normal.  5. Right atrial size was normal.  6. The mitral valve is normal in structure. Trace mitral valve  regurgitation. No evidence of mitral stenosis.  7. The tricuspid valve is normal in structure. Tricuspid valve  regurgitation was not visualized by color flow Doppler.  8. The aortic valve is tricuspid Aortic valve regurgitation was not  visualized by color flow Doppler. Mild aortic valve sclerosis without  stenosis.  9. The pulmonic valve was not well visualized. Pulmonic valve  regurgitation is not visualized by color flow Doppler.  10. Normal pulmonary artery systolic pressure.  11. The inferior vena cava IVC is small, suggesting low RA pressure and  hypovolemia.   Anesthesia Physical Anesthesia Plan  ASA: III  Anesthesia Plan: General   Post-op Pain Management:     Induction: Intravenous  PONV Risk Score and Plan: 4 or greater and Ondansetron and Treatment may vary due to age or medical condition  Airway Management Planned: Oral ETT  Additional Equipment: Arterial line  Intra-op Plan:   Post-operative Plan: Extubation in OR  Informed Consent:   Plan Discussed with: CRNA  Anesthesia Plan Comments: (PAT note written by Myra Gianotti, PA-C.  Case cancelled due to labs out of range.  )      Anesthesia Quick Evaluation

## 2019-11-19 NOTE — Progress Notes (Signed)
Anesthesia Chart Review: Tanya [name]   Case: [id number] Date/Time: 11/23/19 0815   Procedure: Drucilla Schmidt (N/A )   Anesthesia type: General   Pre-op diagnosis: ANERYSYM   Location: [place] [facility] [place] [facility]   Surgeons: Luanne Bras, MD      DISCUSSION: Patient is a 69 year old female scheduled for the above procedure. According to 09/08/19 IR notes, "Plan for follow-up with an image-guided cerebral arteriogram with intent to treat left ICA aneurysm, first available in 2021.Marland KitchenMarland KitchenInstructed patient to continue taking Plavix 75 mg once daily and Aspirin 81 mg once daily."  History includes never smoker, CAD (s/p DES x2 LAD 06/06/18), HTN, HLD, GERD, hypothyroidism, fatty liver with elevated LFTs, migraine headaches, anxiety, anhedonia, insomnia, hearing aids, aneurysm (left ICA).  Recent evaluations by her PCP Dr. Moshe Cipro and cardiologist Dr. Bronson Ing. Of note, per Dr. Court Joy 09/18/19 note, he is aware that she has occasional atypical chest pains and can get SOB when she walks for long distances. Symptoms were also noted in consult note by [name] 06/30/19. There was 40% in-stent restenosis mid LAD on 04/2019 cardiac cath with no intervention recommended. LCX and RCA were angiographically normal by 2019 cath.    She is a same day work-up and is for labs and anesthesia team evaluation on the day of her procedure. Presurgical COVID-19 test is scheduled for 11/20/19. Patient's husband is Darnelle Spangle.    VS:   BP Readings from Last 3 Encounters:  10/20/19 135/84  09/18/19 135/84  09/01/19 (!) 147/70   Pulse Readings from Last 3 Encounters:  09/18/19 66  09/01/19 (!) 53  08/04/19 95    PROVIDERS: [name], MD is PCP. Last evaluation 10/25/19. She has advised patient proceed with procedure for ICA aneurysm.  [name], MD is cardiologist. Last evaluation 09/18/19. Continue ASA, Plavix, rosuvastatin with 6 month follow-up planned.  He was aware of  recent finding of left carotid aneurysm.   LABS: She is for updated labs on the day of procedure. 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   EKG: 06/28/19: Sinus rhythm Nonspecific intraventricular conduction delay No significant change since last tracing Confirmed by Orpah Greek [phone]) on 06/28/2019 11:43:01 PM   CV: Echo 06/29/19: IMPRESSIONS  1. Left ventricular ejection fraction, by visual estimation, is 55 to  60%. The left ventricle has normal function. There is no left ventricular  hypertrophy.  2. Left ventricular diastolic Doppler parameters are consistent with  impaired relaxation pattern of LV diastolic filling.  3. Global right ventricle has normal systolic function.The right  ventricular size is normal. No increase in right ventricular wall  thickness.  4. Left atrial size was normal.  5. Right atrial size was normal.  6. The mitral valve is normal in structure. Trace mitral valve  regurgitation. No evidence of mitral stenosis.  7. The tricuspid valve is normal in structure. Tricuspid valve  regurgitation was not visualized by color flow Doppler.  8. The aortic valve is tricuspid Aortic valve regurgitation was not  visualized by color flow Doppler. Mild aortic valve sclerosis without  stenosis.  9. The pulmonic valve was not  well visualized. Pulmonic valve  regurgitation is not visualized by color flow Doppler.  10. Normal pulmonary artery systolic pressure.  11. The inferior vena cava IVC is small, suggesting low RA pressure and  hypovolemia.   Cardiac cath 04/24/19: LAD: Prox LAD is 40% stenosed. The lesion was previously treated. Mid LAD is 10% stenosed. The lesion was previously treated.  - CONCLUSION:  40% eccentric mid LAD in-stent restenosis.  At completion of stent procedure in September 2020, there was residual stent deformity in this region.  There is no high-grade obstruction in the LAD stent.  The LAD is otherwise normal.  Normal left main  Normal circumflex  Normal RCA  Normal LV systolic function and end-diastolic pressure - RECOMMENDATIONS:  Dyspnea cannot be explained by findings on today's study based on absence of significant coronary obstruction and normal LV hemodynamics.  Continue dual antiplatelet therapy beyond 12 months due to overlapping stents in the LAD. (LHC/PCI 06/06/18: 80% Prox LAd. Large, normal LCx. Angiographically normal RCA. S/p DES LAD x2.)   Past Medical History:  Diagnosis Date  . Aneurysm ([place])    left ICA 09/2019  . Anhedonia   . Anxiety   . CAD (coronary artery disease)    a. s/p DESx2 to proximal LAD in 06/2018 b. cath in 04/2019 showing 40% mid-LAD ISR with no significant stenosis along LM, LCx, or RCA  . Cystitis   . Elevated liver enzymes   . Fatty liver   . GERD (gastroesophageal reflux disease)   . Headache    migraine  . Hyperlipidemia   . Hypertension   . Insomnia   . Unspecified hypothyroidism   . Vaginitis   . Wears glasses   . Wears hearing aid    B/L    Past Surgical History:  Procedure Laterality Date  . CATARACT EXTRACTION W/PHACO Right 05/08/2019   Procedure: CATARACT EXTRACTION PHACO AND INTRAOCULAR LENS PLACEMENT (IOC);  Surgeon: Baruch Goldmann, MD;  [facility];  Service: Ophthalmology;  Laterality: Right;  CDE: 10.02  .  COLONOSCOPY N/A 02/08/2016   Procedure: COLONOSCOPY;  Surgeon: [name], MD;  [facility]: [facility];  Service: Endoscopy;  Laterality: N/A;  1200 - moved to 11:45 - Ann to notify  . CORONARY STENT INTERVENTION N/A 06/06/2018   Procedure: CORONARY STENT INTERVENTION;  Surgeon: [name], [name], MD;  Location: [place] CV LAB;  Service: Cardiovascular;  Laterality: N/A;  . INTRAVASCULAR PRESSURE WIRE/FFR STUDY N/A 06/06/2018   Procedure: INTRAVASCULAR PRESSURE WIRE/FFR STUDY;  Surgeon: [name], [name], MD;  Location: [place] CV LAB;  Service: Cardiovascular;  Laterality: N/A;  . IR ANGIO INTRA EXTRACRAN SEL COM CAROTID INNOMINATE BILAT MOD SED  09/01/2019  . IR ANGIO VERTEBRAL SEL VERTEBRAL BILAT MOD SED  09/01/2019  . LEFT HEART CATH AND CORONARY ANGIOGRAPHY N/A 04/24/2019   Procedure: LEFT HEART CATH AND CORONARY ANGIOGRAPHY;  Surgeon: Belva Crome, MD;  Location: [place] CV LAB;  Service: Cardiovascular;  Laterality: N/A;  . LEFT HEART CATHETERIZATION WITH CORONARY ANGIOGRAM N/A 11/19/2014   Procedure: LEFT HEART CATHETERIZATION WITH CORONARY ANGIOGRAM;  Surgeon: [name], MD;  Location: [facility] CATH LAB;  Service: Cardiovascular;  Laterality: N/A;  . RIGHT/LEFT HEART CATH AND CORONARY ANGIOGRAPHY N/A 06/06/2018   Procedure: RIGHT/LEFT HEART CATH AND CORONARY ANGIOGRAPHY;  Surgeon: [name], [name], MD;  Location: [place] CV LAB;  Service: Cardiovascular;  Laterality: N/A;    MEDICATIONS: No current facility-administered medications for this encounter.   Marland Kitchen amLODipine (NORVASC) 2.5 MG tablet  . Ascorbic Acid (VITAMIN C) 1000 MG tablet  . aspirin EC 81 MG tablet  . Biotin 5000 MCG CAPS  . busPIRone (BUSPAR) 5 MG tablet  . Carboxymethylcellulose Sodium (THERATEARS OP)  . cloNIDine (CATAPRES) 0.2 MG tablet  . clopidogrel (PLAVIX) 75 MG tablet  . diphenhydrAMINE (BENADRYL) 25 MG tablet  . nitroGLYCERIN (NITROSTAT) 0.4 MG SL tablet  . Omega-3 Fatty Acids (FISH OIL) 1000 MG CAPS   . pantoprazole (PROTONIX) 40 MG tablet  . potassium chloride SA (KLOR-CON) 20 MEQ tablet  . topiramate (TOPAMAX) 25 MG tablet  . rosuvastatin (CRESTOR) 40 MG tablet  . temazepam (RESTORIL) 15 MG capsule    Myra Gianotti, PA-C Surgical Short Stay/Anesthesiology [facility] Phone [phone] [facility] Phone [phone] 11/19/2019 3:01 PM

## 2019-11-19 NOTE — Progress Notes (Signed)
Called patient, no answer.  Left voicemail, stating to call back for instructions for day of surgery.

## 2019-11-19 NOTE — Progress Notes (Signed)
SDW-pre-op call completed by both pt and spouse, Darnelle Spangle [facility]).  Pt stated  " I have had SOB and chest pain sometimes since having my stents put in. "  Pt stated that she is under the care of Dr. Bronson Ing, Cardiology and Dr. Tula Nakayama, PCP.  Pt stated that she takes Aspirin at night.  Pt made aware to stop taking vitamins, fish oil, Biotin and herbal medications.  Do not take any NSAIDs ie: Ibuprofen, Advil, Naproxen (Aleve), Motrin, BC and Goody Powder. Pt reminded to quarantine. Pt verbalized understanding of all pre-op instructions. PA, Anesthesiology, made aware of order for " Consult " and pt cardiac history.

## 2019-11-20 ENCOUNTER — Other Ambulatory Visit (HOSPITAL_COMMUNITY)
Admission: RE | Admit: 2019-11-20 | Discharge: 2019-11-20 | Disposition: A | Discharge status: Home / Self Care | Payer: Medicare Other | Source: Ambulatory Visit | Attending: Interventional Radiology | Admitting: Interventional Radiology

## 2019-11-20 DIAGNOSIS — Z01812 Encounter for preprocedural laboratory examination: Secondary | ICD-10-CM | POA: Insufficient documentation

## 2019-11-20 DIAGNOSIS — Z20822 Contact with and (suspected) exposure to covid-19: Secondary | ICD-10-CM | POA: Diagnosis not present

## 2019-11-20 LAB — SARS CORONAVIRUS 2 (TAT 6-24 HRS): SARS Coronavirus 2: NEGATIVE

## 2019-11-22 ENCOUNTER — Other Ambulatory Visit: Payer: Self-pay | Admitting: Family Medicine

## 2019-11-23 ENCOUNTER — Ambulatory Visit (HOSPITAL_COMMUNITY)
Admission: RE | Admit: 2019-11-23 | Discharge: 2019-11-23 | Disposition: A | Discharge status: Home / Self Care | Payer: Medicare Other | Attending: Interventional Radiology | Admitting: Interventional Radiology

## 2019-11-23 ENCOUNTER — Encounter (HOSPITAL_COMMUNITY): Payer: Self-pay | Admitting: Interventional Radiology

## 2019-11-23 ENCOUNTER — Encounter (HOSPITAL_COMMUNITY): Payer: Self-pay

## 2019-11-23 ENCOUNTER — Encounter (HOSPITAL_COMMUNITY)
Admission: RE | Disposition: A | Discharge status: Home / Self Care | Payer: Self-pay | Source: Home / Self Care | Attending: Interventional Radiology

## 2019-11-23 ENCOUNTER — Ambulatory Visit (HOSPITAL_COMMUNITY)
Admission: RE | Admit: 2019-11-23 | Discharge: 2019-11-23 | Disposition: A | Discharge status: Home / Self Care | Payer: Medicare Other | Source: Ambulatory Visit | Attending: Interventional Radiology | Admitting: Interventional Radiology

## 2019-11-23 ENCOUNTER — Other Ambulatory Visit: Payer: Self-pay

## 2019-11-23 DIAGNOSIS — K219 Gastro-esophageal reflux disease without esophagitis: Secondary | ICD-10-CM | POA: Diagnosis not present

## 2019-11-23 DIAGNOSIS — I671 Cerebral aneurysm, nonruptured: Secondary | ICD-10-CM | POA: Diagnosis not present

## 2019-11-23 DIAGNOSIS — Z01812 Encounter for preprocedural laboratory examination: Secondary | ICD-10-CM | POA: Diagnosis not present

## 2019-11-23 DIAGNOSIS — Z7902 Long term (current) use of antithrombotics/antiplatelets: Secondary | ICD-10-CM | POA: Diagnosis not present

## 2019-11-23 DIAGNOSIS — G4700 Insomnia, unspecified: Secondary | ICD-10-CM | POA: Diagnosis not present

## 2019-11-23 DIAGNOSIS — I10 Essential (primary) hypertension: Secondary | ICD-10-CM | POA: Diagnosis not present

## 2019-11-23 DIAGNOSIS — E785 Hyperlipidemia, unspecified: Secondary | ICD-10-CM | POA: Insufficient documentation

## 2019-11-23 DIAGNOSIS — Z79899 Other long term (current) drug therapy: Secondary | ICD-10-CM | POA: Diagnosis not present

## 2019-11-23 DIAGNOSIS — Z538 Procedure and treatment not carried out for other reasons: Secondary | ICD-10-CM | POA: Insufficient documentation

## 2019-11-23 DIAGNOSIS — E039 Hypothyroidism, unspecified: Secondary | ICD-10-CM | POA: Diagnosis not present

## 2019-11-23 DIAGNOSIS — Z8249 Family history of ischemic heart disease and other diseases of the circulatory system: Secondary | ICD-10-CM | POA: Insufficient documentation

## 2019-11-23 DIAGNOSIS — Z955 Presence of coronary angioplasty implant and graft: Secondary | ICD-10-CM | POA: Insufficient documentation

## 2019-11-23 DIAGNOSIS — I2510 Atherosclerotic heart disease of native coronary artery without angina pectoris: Secondary | ICD-10-CM | POA: Insufficient documentation

## 2019-11-23 DIAGNOSIS — Z7982 Long term (current) use of aspirin: Secondary | ICD-10-CM | POA: Diagnosis not present

## 2019-11-23 DIAGNOSIS — K760 Fatty (change of) liver, not elsewhere classified: Secondary | ICD-10-CM | POA: Insufficient documentation

## 2019-11-23 HISTORY — DX: Headache, unspecified: R51.9

## 2019-11-23 HISTORY — DX: Aneurysm of unspecified site: I72.9

## 2019-11-23 HISTORY — DX: Presence of external hearing-aid: Z97.4

## 2019-11-23 HISTORY — DX: Presence of spectacles and contact lenses: Z97.3

## 2019-11-23 HISTORY — DX: Anxiety disorder, unspecified: F41.9

## 2019-11-23 HISTORY — PX: RADIOLOGY WITH ANESTHESIA: SHX6223

## 2019-11-23 LAB — URINALYSIS, COMPLETE (UACMP) WITH MICROSCOPIC
Bilirubin Urine: NEGATIVE
Glucose, UA: NEGATIVE mg/dL
Ketones, ur: NEGATIVE mg/dL
Leukocytes,Ua: NEGATIVE
Nitrite: NEGATIVE
Protein, ur: NEGATIVE mg/dL
Specific Gravity, Urine: 1.01 (ref 1.005–1.030)
pH: 7 (ref 5.0–8.0)

## 2019-11-23 LAB — CBC WITH DIFFERENTIAL/PLATELET
Abs Immature Granulocytes: 0 10*3/uL (ref 0.00–0.07)
Basophils Absolute: 0.1 10*3/uL (ref 0.0–0.1)
Basophils Relative: 2 %
Eosinophils Absolute: 0.2 10*3/uL (ref 0.0–0.5)
Eosinophils Relative: 4 %
HCT: 41 % (ref 36.0–46.0)
Hemoglobin: 13.5 g/dL (ref 12.0–15.0)
Immature Granulocytes: 0 %
Lymphocytes Relative: 38 %
Lymphs Abs: 1.8 10*3/uL (ref 0.7–4.0)
MCH: 31 pg (ref 26.0–34.0)
MCHC: 32.9 g/dL (ref 30.0–36.0)
MCV: 94 fL (ref 80.0–100.0)
Monocytes Absolute: 0.5 10*3/uL (ref 0.1–1.0)
Monocytes Relative: 9 %
Neutro Abs: 2.4 10*3/uL (ref 1.7–7.7)
Neutrophils Relative %: 47 %
Platelets: 306 10*3/uL (ref 150–400)
RBC: 4.36 MIL/uL (ref 3.87–5.11)
RDW: 13.3 % (ref 11.5–15.5)
WBC: 4.9 10*3/uL (ref 4.0–10.5)
nRBC: 0 % (ref 0.0–0.2)

## 2019-11-23 LAB — BASIC METABOLIC PANEL
Anion gap: 11 (ref 5–15)
BUN: 17 mg/dL (ref 8–23)
CO2: 21 mmol/L — ABNORMAL LOW (ref 22–32)
Calcium: 9.4 mg/dL (ref 8.9–10.3)
Chloride: 108 mmol/L (ref 98–111)
Creatinine, Ser: 0.74 mg/dL (ref 0.44–1.00)
GFR calc Af Amer: >60 mL/min (ref >60)
GFR calc non Af Amer: >60 mL/min (ref >60)
Glucose, Bld: 98 mg/dL (ref 70–99)
Potassium: 3.8 mmol/L (ref 3.5–5.1)
Sodium: 140 mmol/L (ref 135–145)

## 2019-11-23 LAB — PROTIME-INR
INR: 1 (ref 0.8–1.2)
Prothrombin Time: 13.1 seconds (ref 11.4–15.2)

## 2019-11-23 LAB — PLATELET INHIBITION P2Y12: Platelet Function  P2Y12: 6 [PRU] — ABNORMAL LOW (ref 182–335)

## 2019-11-23 LAB — APTT: aPTT: 27 seconds (ref 24–36)

## 2019-11-23 SURGERY — IR WITH ANESTHESIA
Anesthesia: General

## 2019-11-23 MED ORDER — SODIUM CHLORIDE 0.9 % IV SOLN: INTRAVENOUS | Status: DC

## 2019-11-23 MED ORDER — CLOPIDOGREL BISULFATE 75 MG PO TABS
75.0000 mg | ORAL_TABLET | ORAL | Status: DC
  Filled 2019-11-23: qty 1

## 2019-11-23 MED ORDER — ASPIRIN EC 325 MG PO TBEC
325.0000 mg | DELAYED_RELEASE_TABLET | ORAL | Status: DC
  Filled 2019-11-23: qty 1

## 2019-11-23 MED ORDER — NIMODIPINE 30 MG PO CAPS
0.0000 mg | ORAL_CAPSULE | ORAL | Status: DC
  Filled 2019-11-23: qty 2

## 2019-11-23 MED ORDER — CEFAZOLIN SODIUM-DEXTROSE 2-4 GM/100ML-% IV SOLN
2.0000 g | INTRAVENOUS | Status: DC
  Filled 2019-11-23: qty 100

## 2019-11-23 NOTE — H&P (Signed)
Chief Complaint: Patient was seen in consultation today for cerebral arteriogram with possible L ICA aneurysm embolization at the request of Dr Norton Blizzard   Supervising Physician: Luanne Bras  Patient Status: [facility] - Out-pt  History of Present Illness: Tanya [name] is a 69 y.o. female   Left internal carotid artery aneurysm Known to Dr Estanislado Pandy since 09/2019 after referral from Dr Merlene Laughter Noted incidental L ICA aneurysm on MRI 06/29/19 for Rt blurred vision and headaches Denies speech changes Denies numbness; tingling Denies gait instability  Taking ASA and Plavix daily   Diagnostic cerebral arteriogram 09/01/2019: 1. 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  Consultation with Dr Estanislado Pandy 09/08/19 Discussed findings of diagnostic cerebral arteriogram, explaining that left ICA aneurysm is an incidental finding (not the cause of her right-sided headaches or right-sided blurred vision).   Pt opting to treat aneurysm asap Planned for cerebral arteriogram and possible embolization today in IR   Past Medical History:  Diagnosis Date  . Aneurysm ([place])    left ICA 09/2019  . Anhedonia   . Anxiety   . CAD (coronary artery disease)    a. s/p DESx2 to proximal LAD in 06/2018 b. cath in 04/2019 showing 40% mid-LAD ISR with no significant stenosis along LM, LCx, or RCA  . Cystitis   . Elevated liver enzymes   . Fatty liver   . GERD (gastroesophageal reflux disease)   . Headache    migraine  . Hyperlipidemia   . Hypertension   . Insomnia   . Unspecified hypothyroidism   . Vaginitis   . Wears glasses   . Wears hearing aid    B/L    Past Surgical History:  Procedure Laterality Date  . CATARACT EXTRACTION W/PHACO Right 05/08/2019   Procedure: CATARACT EXTRACTION PHACO AND INTRAOCULAR LENS PLACEMENT (IOC);  Surgeon: Baruch Goldmann, MD;  Location: [facility];   Service: Ophthalmology;  Laterality: Right;  CDE: 10.02  . COLONOSCOPY N/A 02/08/2016   Procedure: COLONOSCOPY;  Surgeon: [name], MD;  [facility];  Service: Endoscopy;  Laterality: N/A;  1200 - moved to 11:45 - Ann to notify  . CORONARY STENT INTERVENTION N/A 06/06/2018   Procedure: CORONARY STENT INTERVENTION;  Surgeon: [name], [name], MD;  Location: [place] CV LAB;  Service: Cardiovascular;  Laterality: N/A;  . INTRAVASCULAR PRESSURE WIRE/FFR STUDY N/A 06/06/2018   Procedure: INTRAVASCULAR PRESSURE WIRE/FFR STUDY;  Surgeon: [name], [name], MD;  Location: [place] CV LAB;  Service: Cardiovascular;  Laterality: N/A;  . IR ANGIO INTRA EXTRACRAN SEL COM CAROTID INNOMINATE BILAT MOD SED  09/01/2019  . IR ANGIO VERTEBRAL SEL VERTEBRAL BILAT MOD SED  09/01/2019  . LEFT HEART CATH AND CORONARY ANGIOGRAPHY N/A 04/24/2019   Procedure: LEFT HEART CATH AND CORONARY ANGIOGRAPHY;  Surgeon: Belva Crome, MD;  Location: [place] CV LAB;  Service: Cardiovascular;  Laterality: N/A;  . LEFT HEART CATHETERIZATION WITH CORONARY ANGIOGRAM N/A 11/19/2014   Procedure: LEFT HEART CATHETERIZATION WITH CORONARY ANGIOGRAM;  Surgeon: [name], MD;  Location: [facility] CATH LAB;  Service: Cardiovascular;  Laterality: N/A;  . RIGHT/LEFT HEART CATH AND CORONARY ANGIOGRAPHY N/A 06/06/2018   Procedure: RIGHT/LEFT HEART CATH AND CORONARY ANGIOGRAPHY;  Surgeon: [name], [name], MD;  Location: [place] CV LAB;  Service: Cardiovascular;  Laterality: N/A;    Allergies: Patient has no known allergies.  Medications: Prior to Admission medications  Medication Sig Start Date End Date Taking? Authorizing Provider  amLODipine (NORVASC) 2.5 MG tablet Take 1 tablet (2.5 mg total) by mouth daily. 10/26/19   [name], MD  Ascorbic Acid (VITAMIN C) 1000 MG tablet Take 1,000 mg by mouth daily.     [provider]  aspirin EC 81 MG tablet Take 81 mg by mouth at bedtime.    [provider]  Biotin 5000 MCG CAPS Take 5,000 mcg by mouth daily.    [provider]  busPIRone (BUSPAR) 5 MG tablet Take 5 mg by mouth 2 (two) times daily.    [provider]  Carboxymethylcellulose Sodium (THERATEARS OP) Place 1 drop into both eyes 2 (two) times daily.     [provider]  cloNIDine (CATAPRES) 0.2 MG tablet Take one tablet by mouth at bedtime for blood pressure Patient taking differently: Take 0.2 mg by mouth at bedtime.  09/18/19   [name], MD  clopidogrel (PLAVIX) 75 MG tablet Take 1 tablet (75 mg total) by mouth daily. 09/23/19   [name], MD  diphenhydrAMINE (BENADRYL) 25 MG tablet Take 25 mg by mouth every 6 (six) hours as needed for itching.    [provider]  nitroGLYCERIN (NITROSTAT) 0.4 MG SL tablet Place 1 tablet (0.4 mg total) under the tongue every 5 (five) minutes as needed. Patient taking differently: Place 0.4 mg under the tongue every 5 (five) minutes x 3 doses as needed for chest pain.  06/06/18   Cheryln Manly, NP  Omega-3 Fatty Acids (FISH OIL) 1000 MG CAPS Take 1,000 mg by mouth daily.    [provider]  pantoprazole (PROTONIX) 40 MG tablet Take 1 tablet (40 mg total) by mouth daily. 09/23/19   [name], MD  potassium chloride SA (KLOR-CON) 20 MEQ tablet Take 1 tablet (20 mEq total) by mouth daily. 09/02/19   [name], MD  rosuvastatin (CRESTOR) 40 MG tablet TAKE 1 TABLET BY MOUTH  DAILY 11/19/19   Perlie Mayo, NP  temazepam (RESTORIL) 15 MG capsule Take 1 capsule (15 mg total) by mouth at bedtime as needed for sleep. 11/18/19   Perlie Mayo, NP  topiramate (TOPAMAX) 25 MG tablet Take 50 mg by mouth at bedtime.  07/13/19   [provider]     Family History  Problem Relation Age of Onset  . Dementia Mother   . Heart disease Mother        heart attack   . Hypertension Mother   . Heart attack Father   . Heart disease Father        heart attack     . Hypertension Father     Social History   Socioeconomic History  . Marital status: Married    Spouse name: Not on file  . Number of children: Not on file  . Years of education: Not on file  . Highest education level: Not on file  Occupational History  . Not on file  Tobacco Use  . Smoking status: Never Smoker  . Smokeless tobacco: Never Used  Substance and Sexual Activity  . Alcohol use: No    Alcohol/week: 0.0 standard drinks  . Drug use: No  . Sexual activity: Not on file  Other Topics Concern  . Not on file  Social History Narrative   Pt originally from the [place] been in [place] for 20 years     Social Determinants of Radio broadcast assistant Strain:   .  Difficulty of Paying Living Expenses: Not on file  Food Insecurity:   . Worried About Charity fundraiser in the Last Year: Not on file  . Ran Out of Food in the Last Year: Not on file  Transportation Needs:   . Lack of Transportation (Medical): Not on file  . Lack of Transportation (Non-Medical): Not on file  Physical Activity:   . Days of Exercise per Week: Not on file  . Minutes of Exercise per Session: Not on file  Stress:   . Feeling of Stress : Not on file  Social Connections:   . Frequency of Communication with Friends and Family: Not on file  . Frequency of Social Gatherings with Friends and Family: Not on file  . Attends Religious Services: Not on file  . Active Member of Clubs or Organizations: Not on file  . Attends Archivist Meetings: Not on file  . Marital Status: Not on file    Review of Systems: A 12 point ROS discussed and pertinent positives are indicated in the HPI above.  All other systems are negative.  Review of Systems  Constitutional: Negative for activity change, fatigue, fever and unexpected weight change.  HENT: Negative for facial swelling, hearing loss, tinnitus and trouble swallowing.   Eyes: Negative for visual disturbance.  Respiratory: Negative for cough and  shortness of breath.   Cardiovascular: Negative for chest pain.  Gastrointestinal: Negative for abdominal pain.  Musculoskeletal: Negative for back pain and gait problem.  Neurological: Positive for headaches. Negative for dizziness, tremors, seizures, syncope, facial asymmetry, speech difficulty, weakness, light-headedness and numbness.  Psychiatric/Behavioral: Negative for behavioral problems and confusion.    Vital Signs: There were no vitals taken for this visit.  Physical Exam Vitals reviewed.  HENT:     Head: Atraumatic.     Mouth/Throat:     Mouth: Mucous membranes are moist.  Eyes:     Extraocular Movements: Extraocular movements intact.  Cardiovascular:     Rate and Rhythm: Normal rate and regular rhythm.     Heart sounds: Normal heart sounds.  Pulmonary:     Effort: Pulmonary effort is normal.     Breath sounds: Normal breath sounds.  Abdominal:     Palpations: Abdomen is soft.  Musculoskeletal:        General: Normal range of motion.     Cervical back: Normal range of motion.     Right lower leg: No edema.     Left lower leg: No edema.  Skin:    General: Skin is warm and dry.  Neurological:     Mental Status: She is alert and oriented to person, place, and time.  Psychiatric:        Behavior: Behavior normal.        Thought Content: Thought content normal.        Judgment: Judgment normal.     Imaging: No results found.  Labs:  CBC: Recent Labs    06/16/19 0549 06/29/19 0005 06/29/19 0946 09/01/19 0624  WBC 4.8 6.6 4.9 5.2  HGB 12.0 12.7 13.1 12.7  HCT 36.1 37.1 39.9 37.6  PLT 321 357 355 319    COAGS: Recent Labs    06/15/19 1442 06/29/19 0005 09/01/19 0624  INR 1.0 1.0 1.0  APTT 26 27  --     BMP: Recent Labs    06/30/19 0456 07/13/19 1414 09/01/19 0624 10/21/19 1019  NA 144 142 141 143  K 3.5 4.2 2.9* 3.5  CL 114*  107 110 109  CO2 24 24 22 27   GLUCOSE 95 204* 98 118  BUN 11 14 13 18   CALCIUM 8.9 9.7 9.7 10.0    CREATININE 0.65 0.96 0.66 0.68  GFRNONAA >60 60 >60 89  GFRAA >60 70 >60 103    LIVER FUNCTION TESTS: Recent Labs    06/15/19 1442 06/15/19 1442 06/16/19 0549 06/29/19 0005 06/29/19 0946 07/13/19 1414  BILITOT 0.6   < > 0.7 0.7 0.8 0.4  AST 28   < > 22 27 23 21   ALT 21   < > 19 19 17 14   ALKPHOS 54  --  44 55 48  --   PROT 7.8   < > 6.8 7.5 7.0 7.2  ALBUMIN 4.5  --  3.8 4.5 4.1  --    < > = values in this interval not displayed.    TUMOR MARKERS: No results for input(s): AFPTM, CEA, CA199, CHROMGRNA in the last 8760 hours.  Assessment and Plan:  L ICA aneurysm found incidentally on MR  Evaluating Rt blurred vision and headaches Consulted with Dr Estanislado Pandy and has opted for treatment of same Scheduled now for cerebral arteriogram and possible L ICA aneurysm embolization Risks and benefits of cerebral angiogram with intervention were discussed with the patient including, but not limited to bleeding, infection, vascular injury, contrast induced renal failure, stroke or even death.  This interventional procedure involves the use of X-rays and because of the nature of the planned procedure, it is possible that we will have prolonged use of X-ray fluoroscopy.  Potential radiation risks to you include (but are not limited to) the following: - A slightly elevated risk for cancer  several years later in life. This risk is typically less than 0.5% percent. This risk is low in comparison to the normal incidence of human cancer, which is 33% for women and 50% for men according to the [place]. - Radiation induced injury can include skin redness, resembling a rash, tissue breakdown / ulcers and hair loss (which can be temporary or permanent).   The likelihood of either of these occurring depends on the difficulty of the procedure and whether you are sensitive to radiation due to previous procedures, disease, or genetic conditions.   IF your procedure requires a  prolonged use of radiation, you will be notified and given written instructions for further action.  It is your responsibility to monitor the irradiated area for the 2 weeks following the procedure and to notify your physician if you are concerned that you have suffered a radiation induced injury.    All of the patient's questions were answered, patient is agreeable to proceed. Consent signed and in chart.  Pt is aware of intervention is performed-- she will be admitted overnight into Neuro ICU Plan for discharge home in am She is agreeable  Thank you for this interesting consult.  I greatly enjoyed meeting Cherene A Cawthorn and look forward to participating in their care.  A copy of this report was sent to the requesting provider on this date.  Electronically Signed: Lavonia Drafts, PA-C 11/23/2019, 7:25 AM   I spent a total of  30 Minutes   in face to face in clinical consultation, greater than 50% of which was counseling/coordinating care for L ICA aneurysm embolization

## 2019-11-23 NOTE — Progress Notes (Signed)
Paged Tanya [name] regarding P2Y12 results of less than 6. Per Pam hold meds/A-line and IV until she can speak with MD.

## 2019-11-23 NOTE — Progress Notes (Signed)
Per Namon Cirri, PA- patients procedure canceled due to labs. Written instructions given to patient per [facility].

## 2019-11-23 NOTE — Progress Notes (Signed)
NIR.  Patient was scheduled for an image-guided cerebral arteriogram with possible embolization of left ICA aneurysm this AM with Dr. Estanislado Pandy.  P2Y12 6 PRU this AM. Patient currently taking Plavix 75 mg once daily and Aspirin 81 mg once daily. Discussed case with Dr. Estanislado Pandy who recommends postponing procedure along with medication alterations. Patient to continue taking Aspirin 81 mg once daily, discontinue taking Plavix 75 mg once daily, and begin taking Plavix 37.5 mg once daily (1/2 tablet once daily). Patient to return to [facility] Friday [facility]/27/2019 for repeat P2Y12. Patient will be rescheduled for procedure once P2Y12 acceptable level per Dr. Estanislado Pandy. Discussed above with patient and her husband (via telephone). All questions answered and concerns addressed. Patient conveys understanding and agrees with plan.  Please call NIR with questions/concerns.   Bea Graff Aqua Denslow, PA-C 11/23/2019, 9:12 AM

## 2019-11-23 NOTE — H&P (Deleted)
  The note originally documented on this encounter has been moved the the encounter in which it belongs.  

## 2019-11-24 ENCOUNTER — Encounter: Payer: Self-pay | Admitting: *Deleted

## 2019-11-27 ENCOUNTER — Other Ambulatory Visit (HOSPITAL_COMMUNITY): Payer: Self-pay | Admitting: Radiology

## 2019-11-27 DIAGNOSIS — I671 Cerebral aneurysm, nonruptured: Secondary | ICD-10-CM

## 2019-11-27 LAB — PLATELET INHIBITION P2Y12: Platelet Function  P2Y12: 50 [PRU] — ABNORMAL LOW (ref 182–335)

## 2019-12-04 ENCOUNTER — Ambulatory Visit (HOSPITAL_COMMUNITY)
Admission: RE | Admit: 2019-12-04 | Discharge: 2019-12-04 | Disposition: A | Discharge status: Home / Self Care | Payer: Medicare Other | Source: Ambulatory Visit | Attending: Family Medicine | Admitting: Family Medicine

## 2019-12-04 ENCOUNTER — Other Ambulatory Visit: Payer: Self-pay

## 2019-12-04 DIAGNOSIS — Z1231 Encounter for screening mammogram for malignant neoplasm of breast: Secondary | ICD-10-CM | POA: Insufficient documentation

## 2019-12-16 ENCOUNTER — Other Ambulatory Visit: Payer: Self-pay | Admitting: Radiology

## 2019-12-17 ENCOUNTER — Other Ambulatory Visit: Payer: Self-pay

## 2019-12-17 ENCOUNTER — Other Ambulatory Visit (HOSPITAL_COMMUNITY)
Admission: RE | Admit: 2019-12-17 | Discharge: 2019-12-17 | Disposition: A | Discharge status: Home / Self Care | Payer: Medicare Other | Source: Ambulatory Visit | Attending: Interventional Radiology | Admitting: Interventional Radiology

## 2019-12-17 DIAGNOSIS — Z20822 Contact with and (suspected) exposure to covid-19: Secondary | ICD-10-CM | POA: Diagnosis not present

## 2019-12-17 DIAGNOSIS — Z01812 Encounter for preprocedural laboratory examination: Secondary | ICD-10-CM | POA: Diagnosis not present

## 2019-12-18 ENCOUNTER — Other Ambulatory Visit: Payer: Self-pay | Admitting: Radiology

## 2019-12-18 ENCOUNTER — Other Ambulatory Visit: Payer: Self-pay

## 2019-12-18 ENCOUNTER — Encounter (HOSPITAL_COMMUNITY): Payer: Self-pay | Admitting: Interventional Radiology

## 2019-12-18 LAB — SARS CORONAVIRUS 2 (TAT 6-24 HRS): SARS Coronavirus 2: NEGATIVE

## 2019-12-18 NOTE — Progress Notes (Signed)
SDW-pre-op call completed by pt spouse, Darnelle Spangle [facility]). Spouse stated that pt is under the care of Dr. Bronson Ing, Cardiology and Dr. Tula Nakayama, PCP.  S[pouse made aware to have pt stop  taking vitamins, fish oil, Biotin and herbal medications.  Do not take any NSAIDs ie: Ibuprofen, Advil, Naproxen (Aleve), Motrin, BC and Goody Powder. Spouse reminded to have pt quarantine. Spouse verbalized understanding of all pre-op instructions. PA, Anesthesiology, made aware of order for " Consult " and pt cardiac history.

## 2019-12-18 NOTE — Anesthesia Preprocedure Evaluation (Addendum)
Anesthesia Evaluation  Patient identified by MRN, date of birth, ID band Patient awake    Reviewed: Allergy & Precautions, NPO status , Patient's Chart, lab work & pertinent test results  Airway Mallampati: II  TM Distance: >3 FB Neck ROM: Full    Dental no notable dental hx. (+) Teeth Intact, Dental Advisory Given   Pulmonary neg pulmonary ROS,    Pulmonary exam normal breath sounds clear to auscultation       Cardiovascular hypertension, Pt. on medications + CAD and + Cardiac Stents (s/p DESx2 to proximal LAD in 06/2018 )  Normal cardiovascular exam Rhythm:Regular Rate:Normal  TTE 06/2019 1. Left ventricular ejection fraction, by visual estimation, is 55 to 60%. The left ventricle has normal function. There is no left ventricular hypertrophy.  2. Left ventricular diastolic Doppler parameters are consistent with impaired relaxation pattern of LV diastolic filling.  3. Global right ventricle has normal systolic function.The right ventricular size is normal. No increase in right ventricular wall thickness.  4. Left atrial size was normal.  5. Right atrial size was normal.  6. The mitral valve is normal in structure. Trace mitral valve regurgitation. No evidence of mitral stenosis.  7. The tricuspid valve is normal in structure. Tricuspid valve regurgitation was not visualized by color flow Doppler.  8. The aortic valve is tricuspid Aortic valve regurgitation was not visualized by color flow Doppler. Mild aortic valve sclerosis without stenosis.  9. The pulmonic valve was not well visualized. Pulmonic valve regurgitation is not visualized by color flow Doppler.  10. Normal pulmonary artery systolic pressure.  11. The inferior vena cava IVC is small, suggesting low RA pressure and hypovolemia.   LHC 2020 40% eccentric mid LAD in-stent restenosis.  At completion of stent procedure in September 2020, there was residual stent  deformity in this region.  There is no high-grade obstruction in the LAD stent.  The LAD is otherwise normal. Normal left main Normal circumflex Normal RCA Normal LV systolic function and end-diastolic pressure RECOMMENDATIONS: Dyspnea cannot be explained by findings on today's study based on absence of significant coronary obstruction and normal LV hemodynamics. Continue dual antiplatelet therapy beyond 12 months due to overlapping stents in the LAD.   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  Reproductive/Obstetrics                           Anesthesia Physical Anesthesia Plan  ASA: III  Anesthesia Plan: General   Post-op Pain Management:    Induction: Intravenous  PONV Risk Score and Plan: 3 and Dexamethasone, Ondansetron and Treatment may vary due to age or medical condition  Airway Management Planned: Oral ETT  Additional Equipment:   Intra-op Plan:   Post-operative Plan: Extubation in OR  Informed Consent: I have reviewed the patients History and Physical, chart, labs and discussed the procedure including the risks, benefits and alternatives for the proposed anesthesia with the patient or authorized representative who has indicated his/her understanding and acceptance.     Dental advisory given  Plan Discussed with: CRNA  Anesthesia Plan Comments: (See APP note by Durel Salts, FNP)  Anesthesia Quick Evaluation  

## 2019-12-18 NOTE — Progress Notes (Signed)
Anesthesia Chart Review:  Pt is a same day work up    Case: Tanya [name]:[id number] Date/Time: 12/21/19 0815   Procedure: EMBOLIZATION (N/A )   Anesthesia type: General   Pre-op diagnosis: BRAIN ANEURYSM   Location: [facility]   Surgeons: Luanne Bras, MD      DISCUSSION:  Patient is a 69 year old female scheduled for the above procedure. According to 09/08/19 IR notes, "Plan for follow-upwith an image-guided cerebral arteriogram with intent to treat left ICA aneurysm, first available in 2021.Tanya KitchenMarland KitchenInstructed patient to continue taking Plavix 75 mg once daily and Aspirin 81 mg once daily."  Procedure originally scheduled for 11/23/19; was postponed when P2Y12 was out of therapeutic range.   History includes never smoker, CAD (s/p DES x2 LAD 06/06/18), HTN, HLD, GERD, hypothyroidism, fatty liver with elevated LFTs, migraine headaches, anxiety, anhedonia, insomnia, hearing aids, aneurysm (left ICA).  Recent evaluations by her PCP Dr. Moshe Cipro and cardiologist Dr. Bronson Ing. Of note, per Dr. Court Joy 09/18/19 note, he is aware that she has occasional atypical chest pains and can get SOB when she walks for long distances. Symptoms were also noted in consult note by [name] 06/30/19. There was 40% in-stent restenosis mid LAD on 04/2019 cardiac cath with no intervention recommended. LCX and RCA were angiographically normal by 2019 cath.    Patient's husband is Tanya [name].    PROVIDERS: - PCP is [name], MD  - Cardiologist is [name], MD. Last evaluation 09/18/19. Continue ASA, Plavix, rosuvastatin with 6 month follow-up planned.  He was aware of recent finding of left carotid aneurysm.   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   EKG 06/08/19: Sinus rhythm. Nonspecific intraventricular conduction delay. No significant change since last tracing   CV:  Echo 06/29/19: IMPRESSIONS  1. Left ventricular ejection fraction, by visual estimation, is 55 to 60%. The left ventricle has normal function. There is no left ventricular hypertrophy.  2. Left ventricular diastolic Doppler parameters are consistent with impaired relaxation pattern of LV diastolic filling.  3. Global right ventricle has normal systolic function.The right ventricular size is normal. No increase in right ventricular wall thickness.  4. Left atrial size was normal.  5. Right atrial size was normal.  6. The mitral valve is normal in structure. Trace mitral valve regurgitation. No evidence of mitral stenosis.  7. The tricuspid valve is normal in structure. Tricuspid valve regurgitation was not visualized by color flow Doppler.  8. The aortic valve is tricuspid Aortic valve regurgitation was not visualized by color flow Doppler. Mild aortic valve sclerosis without stenosis.  9. The pulmonic valve was not well visualized. Pulmonic valve regurgitation is not visualized by color flow Doppler.  10. Normal pulmonary artery systolic pressure.  11. The inferior vena cava IVC is small, suggesting low RA pressure and hypovolemia.    Cardiac cath 04/24/19: LAD: Prox LAD is 40% stenosed. The lesion was previously treated. Mid LAD is 10% stenosed. The lesion was previously treated.  - CONCLUSION:  40% eccentric mid LAD in-stent restenosis. At completion of stent procedure in September 2020, there was residual stent deformity in this region. There is no high-grade obstruction in the LAD stent. The LAD is otherwise normal.  Normal left main  Normal circumflex  Normal RCA  Normal LV systolic function and end-diastolic pressure - RECOMMENDATIONS:  Dyspnea cannot be explained by findings on today's study based on absence of significant coronary  obstruction and normal LV hemodynamics.  Continue dual antiplatelet therapy beyond 12 months due to overlapping stents in the LAD. (LHC/PCI 06/06/18: 80% Prox LAd. Large, normal LCx. Angiographically normal RCA. S/p DES LAD x2.)    Past Medical History:  Diagnosis Date  . Aneurysm ([place])    left ICA 09/2019  . Anhedonia   . Anxiety   . CAD (coronary artery disease)    a. s/p DESx2 to proximal LAD in 06/2018 b. cath in 04/2019 showing 40% mid-LAD ISR with no significant stenosis along LM, LCx, or RCA  . Cystitis   . Elevated liver enzymes   . Fatty liver   . GERD (gastroesophageal reflux disease)   . Headache    migraine  . Hyperlipidemia   . Hypertension   . Insomnia   . Unspecified hypothyroidism   . Vaginitis   . Wears glasses   . Wears hearing aid    B/L    Past Surgical History:  Procedure Laterality Date  . CATARACT EXTRACTION W/PHACO Right 05/08/2019   Procedure: CATARACT EXTRACTION PHACO AND INTRAOCULAR LENS PLACEMENT (IOC);  Surgeon: Baruch Goldmann, MD;  Location: [facility];  Service: Ophthalmology;  Laterality: Right;  CDE: 10.02  . COLONOSCOPY N/A 02/08/2016   Procedure: COLONOSCOPY;  Surgeon: [name], MD;  Location: [facility];  Service: Endoscopy;  Laterality: N/A;  1200 - moved to 11:45 - Ann to notify  . CORONARY STENT INTERVENTION N/A 06/06/2018   Procedure: CORONARY STENT INTERVENTION;  Surgeon: [name], [name], MD;  Location: [place] [facility];  Service: Cardiovascular;  Laterality: N/A;  . INTRAVASCULAR PRESSURE WIRE/FFR STUDY N/A 06/06/2018   Procedure: INTRAVASCULAR PRESSURE WIRE/FFR STUDY;  Surgeon: [name], [name], MD;  Location: [place] [facility];  Service: Cardiovascular;  Laterality: N/A;  . IR ANGIO INTRA EXTRACRAN SEL COM CAROTID INNOMINATE BILAT MOD SED  09/01/2019  . IR ANGIO VERTEBRAL SEL VERTEBRAL BILAT MOD SED  09/01/2019  . LEFT HEART CATH AND CORONARY ANGIOGRAPHY N/A 04/24/2019   Procedure: LEFT HEART CATH AND CORONARY ANGIOGRAPHY;  Surgeon:  Belva Crome, MD;  Location: [place] CV LAB;  Service: Cardiovascular;  Laterality: N/A;  . LEFT HEART CATHETERIZATION WITH CORONARY ANGIOGRAM N/A 11/19/2014   Procedure: LEFT HEART CATHETERIZATION WITH CORONARY ANGIOGRAM;  Surgeon: [name], MD;  Location: [facility] C[facility] LAB;  Service: Cardiovascular;  Laterality: N/A;  . RADIOLOGY WITH ANESTHESIA N/A 11/23/2019   Procedure: Drucilla Schmidt;  Surgeon: Luanne Bras, MD;  Location: [facility];  Service: Radiology;  Laterality: N/A;  . RIGHT/LEFT HEART CATH AND CORONARY ANGIOGRAPHY N/A 06/06/2018   Procedure: RIGHT/LEFT HEART CATH AND CORONARY ANGIOGRAPHY;  Surgeon: [name], [name], MD;  Location: [place] CV LAB;  Service: Cardiovascular;  Laterality: N/A;    MEDICATIONS: No current facility-administered medications for this encounter.   Tanya [name] acetaminophen (TYLENOL) 500 MG tablet  . amLODipine (NORVASC) 2.5 MG tablet  . Ascorbic Acid (VITAMIN C) 1000 MG tablet  . aspirin EC 81 MG tablet  . Biotin 5000 MCG CAPS  . busPIRone (BUSPAR) 5 MG tablet  . Carboxymethylcellulose Sodium (THERATEARS OP)  . cloNIDine (CATAPRES) 0.2 MG tablet  . clopidogrel (PLAVIX) 75 MG tablet  . diphenhydrAMINE (BENADRYL) 25 MG tablet  . nitroGLYCERIN (NITROSTAT) 0.4 MG SL tablet  . Omega-3 Fatty Acids (FISH OIL) 1000 MG CAPS  . pantoprazole (PROTONIX)  40 MG tablet  . potassium chloride SA (KLOR-CON) 20 MEQ tablet  . rosuvastatin (CRESTOR) 40 MG tablet  . temazepam (RESTORIL) 15 MG capsule  . topiramate (TOPAMAX) 25 MG tablet    If labs acceptable day of surgery, I anticipate pt can proceed with surgery as scheduled.  Willeen Cass, FNP-BC [facility]/Anesthesiology Phone: [phone] 12/18/2019 11:40 AM

## 2019-12-21 ENCOUNTER — Encounter (HOSPITAL_COMMUNITY)
Admission: RE | Disposition: A | Discharge status: Home / Self Care | Payer: Self-pay | Source: Home / Self Care | Attending: Interventional Radiology

## 2019-12-21 ENCOUNTER — Observation Stay (HOSPITAL_COMMUNITY)
Admission: RE | Admit: 2019-12-21 | Discharge: 2019-12-22 | Disposition: A | Discharge status: Home / Self Care | Payer: Medicare Other | Attending: Interventional Radiology | Admitting: Interventional Radiology

## 2019-12-21 ENCOUNTER — Encounter (HOSPITAL_COMMUNITY): Payer: Self-pay | Admitting: Interventional Radiology

## 2019-12-21 ENCOUNTER — Ambulatory Visit (HOSPITAL_COMMUNITY)
Admission: RE | Admit: 2019-12-21 | Discharge: 2019-12-21 | Disposition: A | Discharge status: Home / Self Care | Payer: Medicare Other | Source: Ambulatory Visit | Attending: Interventional Radiology | Admitting: Interventional Radiology

## 2019-12-21 ENCOUNTER — Ambulatory Visit (HOSPITAL_COMMUNITY): Payer: Medicare Other | Admitting: Emergency Medicine

## 2019-12-21 ENCOUNTER — Encounter (HOSPITAL_COMMUNITY): Payer: Self-pay

## 2019-12-21 ENCOUNTER — Other Ambulatory Visit: Payer: Self-pay

## 2019-12-21 DIAGNOSIS — E785 Hyperlipidemia, unspecified: Secondary | ICD-10-CM | POA: Diagnosis not present

## 2019-12-21 DIAGNOSIS — I671 Cerebral aneurysm, nonruptured: Secondary | ICD-10-CM | POA: Diagnosis not present

## 2019-12-21 DIAGNOSIS — E039 Hypothyroidism, unspecified: Secondary | ICD-10-CM | POA: Insufficient documentation

## 2019-12-21 DIAGNOSIS — Z8249 Family history of ischemic heart disease and other diseases of the circulatory system: Secondary | ICD-10-CM | POA: Diagnosis not present

## 2019-12-21 DIAGNOSIS — I2510 Atherosclerotic heart disease of native coronary artery without angina pectoris: Secondary | ICD-10-CM | POA: Diagnosis not present

## 2019-12-21 DIAGNOSIS — K760 Fatty (change of) liver, not elsewhere classified: Secondary | ICD-10-CM | POA: Diagnosis not present

## 2019-12-21 DIAGNOSIS — R1030 Lower abdominal pain, unspecified: Secondary | ICD-10-CM | POA: Insufficient documentation

## 2019-12-21 DIAGNOSIS — Z79899 Other long term (current) drug therapy: Secondary | ICD-10-CM | POA: Insufficient documentation

## 2019-12-21 DIAGNOSIS — G4700 Insomnia, unspecified: Secondary | ICD-10-CM | POA: Diagnosis not present

## 2019-12-21 DIAGNOSIS — Z955 Presence of coronary angioplasty implant and graft: Secondary | ICD-10-CM | POA: Diagnosis not present

## 2019-12-21 DIAGNOSIS — Z7982 Long term (current) use of aspirin: Secondary | ICD-10-CM | POA: Insufficient documentation

## 2019-12-21 DIAGNOSIS — K219 Gastro-esophageal reflux disease without esophagitis: Secondary | ICD-10-CM | POA: Diagnosis not present

## 2019-12-21 DIAGNOSIS — I10 Essential (primary) hypertension: Secondary | ICD-10-CM | POA: Insufficient documentation

## 2019-12-21 DIAGNOSIS — Z7902 Long term (current) use of antithrombotics/antiplatelets: Secondary | ICD-10-CM | POA: Insufficient documentation

## 2019-12-21 HISTORY — PX: IR ANGIOGRAM FOLLOW UP STUDY: IMG697

## 2019-12-21 HISTORY — PX: IR ANGIO INTRA EXTRACRAN SEL INTERNAL CAROTID UNI L MOD SED: IMG5361

## 2019-12-21 HISTORY — PX: IR TRANSCATH/EMBOLIZ: IMG695

## 2019-12-21 HISTORY — PX: RADIOLOGY WITH ANESTHESIA: SHX6223

## 2019-12-21 LAB — BASIC METABOLIC PANEL
Anion gap: 12 (ref 5–15)
BUN: 23 mg/dL (ref 8–23)
CO2: 22 mmol/L (ref 22–32)
Calcium: 9.3 mg/dL (ref 8.9–10.3)
Chloride: 107 mmol/L (ref 98–111)
Creatinine, Ser: 0.73 mg/dL (ref 0.44–1.00)
GFR calc Af Amer: >60 mL/min (ref >60)
GFR calc non Af Amer: >60 mL/min (ref >60)
Glucose, Bld: 102 mg/dL — ABNORMAL HIGH (ref 70–99)
Potassium: 3.6 mmol/L (ref 3.5–5.1)
Sodium: 141 mmol/L (ref 135–145)

## 2019-12-21 LAB — CBC WITH DIFFERENTIAL/PLATELET
Abs Immature Granulocytes: 0 10*3/uL (ref 0.00–0.07)
Basophils Absolute: 0.1 10*3/uL (ref 0.0–0.1)
Basophils Relative: 2 %
Eosinophils Absolute: 0.2 10*3/uL (ref 0.0–0.5)
Eosinophils Relative: 4 %
HCT: 38.7 % (ref 36.0–46.0)
Hemoglobin: 12.7 g/dL (ref 12.0–15.0)
Immature Granulocytes: 0 %
Lymphocytes Relative: 37 %
Lymphs Abs: 1.8 10*3/uL (ref 0.7–4.0)
MCH: 31 pg (ref 26.0–34.0)
MCHC: 32.8 g/dL (ref 30.0–36.0)
MCV: 94.4 fL (ref 80.0–100.0)
Monocytes Absolute: 0.5 10*3/uL (ref 0.1–1.0)
Monocytes Relative: 9 %
Neutro Abs: 2.3 10*3/uL (ref 1.7–7.7)
Neutrophils Relative %: 48 %
Platelets: 301 10*3/uL (ref 150–400)
RBC: 4.1 MIL/uL (ref 3.87–5.11)
RDW: 13.4 % (ref 11.5–15.5)
WBC: 4.9 10*3/uL (ref 4.0–10.5)
nRBC: 0 % (ref 0.0–0.2)

## 2019-12-21 LAB — HEPARIN LEVEL (UNFRACTIONATED): Heparin Unfractionated: 0.69 IU/mL (ref 0.30–0.70)

## 2019-12-21 LAB — MRSA PCR SCREENING: MRSA by PCR: NEGATIVE

## 2019-12-21 LAB — URINALYSIS, COMPLETE (UACMP) WITH MICROSCOPIC
Bacteria, UA: NONE SEEN
Bilirubin Urine: NEGATIVE
Glucose, UA: NEGATIVE mg/dL
Ketones, ur: NEGATIVE mg/dL
Leukocytes,Ua: NEGATIVE
Nitrite: NEGATIVE
Protein, ur: NEGATIVE mg/dL
Specific Gravity, Urine: 1.011 (ref 1.005–1.030)
pH: 6 (ref 5.0–8.0)

## 2019-12-21 LAB — PROTIME-INR
INR: 1 (ref 0.8–1.2)
Prothrombin Time: 12.9 seconds (ref 11.4–15.2)

## 2019-12-21 LAB — PLATELET INHIBITION P2Y12: Platelet Function  P2Y12: 125 [PRU] — ABNORMAL LOW (ref 182–335)

## 2019-12-21 LAB — POCT ACTIVATED CLOTTING TIME: Activated Clotting Time: 153 seconds

## 2019-12-21 SURGERY — IR WITH ANESTHESIA
Anesthesia: General

## 2019-12-21 MED ORDER — CLOPIDOGREL BISULFATE 75 MG PO TABS
75.0000 mg | ORAL_TABLET | Freq: Every day | ORAL | Status: DC
  Administered 2019-12-22: 09:00:00 75 mg via ORAL

## 2019-12-21 MED ORDER — CLONIDINE HCL 0.2 MG PO TABS
0.2000 mg | ORAL_TABLET | Freq: Every day | ORAL | Status: DC
  Administered 2019-12-21: 21:00:00 0.2 mg via ORAL
  Filled 2019-12-21: qty 1

## 2019-12-21 MED ORDER — AMLODIPINE BESYLATE 2.5 MG PO TABS
2.5000 mg | ORAL_TABLET | Freq: Every day | ORAL | Status: DC
  Administered 2019-12-22: 09:00:00 2.5 mg via ORAL
  Filled 2019-12-21: qty 1

## 2019-12-21 MED ORDER — ACETAMINOPHEN 325 MG PO TABS: 650.0000 mg | ORAL_TABLET | ORAL | Status: DC | PRN

## 2019-12-21 MED ORDER — DEXAMETHASONE SODIUM PHOSPHATE 10 MG/ML IJ SOLN
INTRAMUSCULAR | Status: DC | PRN
  Administered 2019-12-21: 10 mg via INTRAVENOUS

## 2019-12-21 MED ORDER — ROCURONIUM BROMIDE 100 MG/10ML IV SOLN
INTRAVENOUS | Status: DC | PRN
  Administered 2019-12-21: 20 mg via INTRAVENOUS
  Administered 2019-12-21: 60 mg via INTRAVENOUS

## 2019-12-21 MED ORDER — FENTANYL CITRATE (PF) 100 MCG/2ML IJ SOLN
INTRAMUSCULAR | Status: DC | PRN
  Administered 2019-12-21 (×2): 50 ug via INTRAVENOUS

## 2019-12-21 MED ORDER — SODIUM CHLORIDE 0.9 % IV SOLN: INTRAVENOUS | Status: DC | PRN

## 2019-12-21 MED ORDER — ASPIRIN EC 325 MG PO TBEC
325.0000 mg | DELAYED_RELEASE_TABLET | ORAL | Status: AC
  Administered 2019-12-21: 325 mg via ORAL
  Filled 2019-12-21: qty 1

## 2019-12-21 MED ORDER — EPTIFIBATIDE 20 MG/10ML IV SOLN
INTRAVENOUS | Status: AC
  Filled 2019-12-21: qty 10

## 2019-12-21 MED ORDER — NITROGLYCERIN 0.4 MG SL SUBL: 0.4000 mg | SUBLINGUAL_TABLET | SUBLINGUAL | Status: DC | PRN

## 2019-12-21 MED ORDER — HEPARIN (PORCINE) 25000 UT/250ML-% IV SOLN: 300.0000 [IU]/h | INTRAVENOUS | Status: DC

## 2019-12-21 MED ORDER — CEFAZOLIN SODIUM-DEXTROSE 2-4 GM/100ML-% IV SOLN
2.0000 g | INTRAVENOUS | Status: AC
  Administered 2019-12-21: 2 g via INTRAVENOUS

## 2019-12-21 MED ORDER — NIMODIPINE 30 MG PO CAPS
0.0000 mg | ORAL_CAPSULE | ORAL | Status: AC
  Administered 2019-12-21: 08:00:00 60 mg via ORAL
  Filled 2019-12-21: qty 2

## 2019-12-21 MED ORDER — FENTANYL CITRATE (PF) 100 MCG/2ML IJ SOLN
INTRAMUSCULAR | Status: AC
  Filled 2019-12-21: qty 2

## 2019-12-21 MED ORDER — DIPHENHYDRAMINE HCL 25 MG PO CAPS: 50.0000 mg | ORAL_CAPSULE | Freq: Every day | ORAL | Status: DC | PRN

## 2019-12-21 MED ORDER — ROSUVASTATIN CALCIUM 20 MG PO TABS
40.0000 mg | ORAL_TABLET | Freq: Every day | ORAL | Status: DC
  Administered 2019-12-22: 09:00:00 40 mg via ORAL
  Filled 2019-12-21: qty 2

## 2019-12-21 MED ORDER — LABETALOL HCL 5 MG/ML IV SOLN
INTRAVENOUS | Status: DC | PRN
  Administered 2019-12-21 (×3): 5 mg via INTRAVENOUS

## 2019-12-21 MED ORDER — PROTAMINE SULFATE 10 MG/ML IV SOLN
INTRAVENOUS | Status: DC | PRN
  Administered 2019-12-21: 5 mg via INTRAVENOUS
  Administered 2019-12-21: 7.5 mg via INTRAVENOUS

## 2019-12-21 MED ORDER — ACETAMINOPHEN 160 MG/5ML PO SOLN: 650.0000 mg | ORAL | Status: DC | PRN

## 2019-12-21 MED ORDER — CHLORHEXIDINE GLUCONATE CLOTH 2 % EX PADS: 6.0000 | MEDICATED_PAD | Freq: Every day | CUTANEOUS | Status: DC

## 2019-12-21 MED ORDER — PROPOFOL 10 MG/ML IV BOLUS
INTRAVENOUS | Status: DC | PRN
  Administered 2019-12-21: 120 mg via INTRAVENOUS

## 2019-12-21 MED ORDER — HEPARIN (PORCINE) 25000 UT/250ML-% IV SOLN
500.0000 [IU]/h | INTRAVENOUS | Status: DC
  Administered 2019-12-21: 13:00:00 500 [IU]/h via INTRAVENOUS

## 2019-12-21 MED ORDER — SUGAMMADEX SODIUM 200 MG/2ML IV SOLN
INTRAVENOUS | Status: DC | PRN
  Administered 2019-12-21: 150 mg via INTRAVENOUS

## 2019-12-21 MED ORDER — TOPIRAMATE 25 MG PO TABS
50.0000 mg | ORAL_TABLET | Freq: Every day | ORAL | Status: DC
  Administered 2019-12-21: 21:00:00 50 mg via ORAL
  Filled 2019-12-21: qty 2

## 2019-12-21 MED ORDER — PHENYLEPHRINE 40 MCG/ML (10ML) SYRINGE FOR IV PUSH (FOR BLOOD PRESSURE SUPPORT)
PREFILLED_SYRINGE | INTRAVENOUS | Status: DC | PRN
  Administered 2019-12-21 (×2): 40 ug via INTRAVENOUS
  Administered 2019-12-21 (×2): 80 ug via INTRAVENOUS

## 2019-12-21 MED ORDER — ACETAMINOPHEN 650 MG RE SUPP: 650.0000 mg | RECTAL | Status: DC | PRN

## 2019-12-21 MED ORDER — CLOPIDOGREL BISULFATE 75 MG PO TABS
75.0000 mg | ORAL_TABLET | ORAL | Status: AC
  Administered 2019-12-21: 08:00:00 150 mg via ORAL
  Filled 2019-12-21 (×2): qty 1

## 2019-12-21 MED ORDER — ONDANSETRON HCL 4 MG/2ML IJ SOLN
INTRAMUSCULAR | Status: DC | PRN
  Administered 2019-12-21: 4 mg via INTRAVENOUS

## 2019-12-21 MED ORDER — TEMAZEPAM 15 MG PO CAPS
15.0000 mg | ORAL_CAPSULE | Freq: Every day | ORAL | Status: DC
  Administered 2019-12-21: 21:00:00 15 mg via ORAL
  Filled 2019-12-21: qty 1

## 2019-12-21 MED ORDER — BUSPIRONE HCL 5 MG PO TABS
5.0000 mg | ORAL_TABLET | Freq: Two times a day (BID) | ORAL | Status: DC
  Administered 2019-12-21 – 2019-12-22 (×2): 5 mg via ORAL
  Filled 2019-12-21 (×3): qty 1

## 2019-12-21 MED ORDER — IOHEXOL 240 MG/ML SOLN
INTRAMUSCULAR | Status: AC
  Filled 2019-12-21: qty 200

## 2019-12-21 MED ORDER — EPTIFIBATIDE 20 MG/10ML IV SOLN
INTRAVENOUS | Status: DC | PRN
  Administered 2019-12-21: 3 mg via INTRAVENOUS
  Administered 2019-12-21: 1.5 mg via INTRAVENOUS

## 2019-12-21 MED ORDER — ACETAMINOPHEN 500 MG PO TABS: 1000.0000 mg | ORAL_TABLET | Freq: Once | ORAL | Status: DC

## 2019-12-21 MED ORDER — ASPIRIN 325 MG PO TABS
325.0000 mg | ORAL_TABLET | Freq: Every day | ORAL | Status: DC
  Administered 2019-12-22: 09:00:00 325 mg via ORAL
  Filled 2019-12-21: qty 1

## 2019-12-21 MED ORDER — LACTATED RINGERS IV SOLN: INTRAVENOUS | Status: DC

## 2019-12-21 MED ORDER — CLEVIDIPINE BUTYRATE 0.5 MG/ML IV EMUL
0.0000 mg/h | INTRAVENOUS | Status: DC
  Administered 2019-12-21: 18:00:00 11 mg/h via INTRAVENOUS
  Administered 2019-12-21: 12:00:00 2 mg/h via INTRAVENOUS
  Administered 2019-12-21: 21:00:00 16 mg/h via INTRAVENOUS
  Administered 2019-12-22: 04:00:00 2 mg/h via INTRAVENOUS
  Administered 2019-12-22: 04:00:00 6 mg/h via INTRAVENOUS
  Filled 2019-12-21: qty 100
  Filled 2019-12-21 (×2): qty 50

## 2019-12-21 MED ORDER — SODIUM CHLORIDE 0.9 % IV SOLN: INTRAVENOUS | Status: DC

## 2019-12-21 MED ORDER — FENTANYL CITRATE (PF) 100 MCG/2ML IJ SOLN: 25.0000 ug | INTRAMUSCULAR | Status: DC | PRN

## 2019-12-21 MED ORDER — CLEVIDIPINE BUTYRATE 0.5 MG/ML IV EMUL
INTRAVENOUS | Status: AC
  Filled 2019-12-21: qty 50

## 2019-12-21 MED ORDER — HEPARIN (PORCINE) 25000 UT/250ML-% IV SOLN: 300.0000 [IU]/h | INTRAVENOUS | Status: AC

## 2019-12-21 MED ORDER — PANTOPRAZOLE SODIUM 40 MG PO TBEC
40.0000 mg | DELAYED_RELEASE_TABLET | Freq: Every day | ORAL | Status: DC
  Administered 2019-12-22: 09:00:00 40 mg via ORAL
  Filled 2019-12-21: qty 1

## 2019-12-21 MED ORDER — FENTANYL CITRATE (PF) 100 MCG/2ML IJ SOLN: 25.0000 ug | Freq: Once | INTRAMUSCULAR | Status: DC

## 2019-12-21 MED ORDER — CEFAZOLIN SODIUM-DEXTROSE 2-4 GM/100ML-% IV SOLN
INTRAVENOUS | Status: AC
  Filled 2019-12-21: qty 100

## 2019-12-21 MED ORDER — ONDANSETRON HCL 4 MG/2ML IJ SOLN: 4.0000 mg | Freq: Four times a day (QID) | INTRAMUSCULAR | Status: DC | PRN

## 2019-12-21 MED ORDER — ASPIRIN 81 MG PO CHEW: 324.0000 mg | CHEWABLE_TABLET | Freq: Every day | ORAL | Status: DC

## 2019-12-21 MED ORDER — IOHEXOL 300 MG/ML  SOLN
150.0000 mL | Freq: Once | INTRAMUSCULAR | Status: AC | PRN
  Administered 2019-12-21: 75 mL via INTRA_ARTERIAL

## 2019-12-21 MED ORDER — CLOPIDOGREL BISULFATE 75 MG PO TABS: 150.0000 mg | ORAL_TABLET | Freq: Once | ORAL | Status: DC

## 2019-12-21 MED ORDER — PHENYLEPHRINE HCL-NACL 10-0.9 MG/250ML-% IV SOLN
INTRAVENOUS | Status: DC | PRN
  Administered 2019-12-21: 25 ug/min via INTRAVENOUS

## 2019-12-21 MED ORDER — NITROGLYCERIN 1 MG/10 ML FOR IR/CATH LAB
INTRA_ARTERIAL | Status: AC
  Filled 2019-12-21: qty 10

## 2019-12-21 MED ORDER — HEPARIN SODIUM (PORCINE) 1000 UNIT/ML IJ SOLN
INTRAMUSCULAR | Status: DC | PRN
  Administered 2019-12-21: 3000 [IU] via INTRAVENOUS
  Administered 2019-12-21: 1000 [IU] via INTRAVENOUS

## 2019-12-21 MED ORDER — LIDOCAINE 2% (20 MG/ML) 5 ML SYRINGE
INTRAMUSCULAR | Status: DC | PRN
  Administered 2019-12-21: 40 mg via INTRAVENOUS

## 2019-12-21 MED ORDER — CLOPIDOGREL BISULFATE 75 MG PO TABS
75.0000 mg | ORAL_TABLET | Freq: Every day | ORAL | Status: DC
  Filled 2019-12-21: qty 1

## 2019-12-21 MED ORDER — HEPARIN (PORCINE) 25000 UT/250ML-% IV SOLN
INTRAVENOUS | Status: AC
  Filled 2019-12-21: qty 250

## 2019-12-21 NOTE — Progress Notes (Addendum)
At 1743 this nurse noticed blood tinged urine in suction canister for purewick set up. Began holding pressure, laid bed flat, and called for help. Blood pressure on A-line increased to 180s. Titrated cleviprex up.  Claiborne Billings RN came into room and called Dr. Estanislado Pandy. Got orders for Fentanyl 1 time dose, hold heparin until 1845, and blood pressure goal around 120, hold pressure for 20-30 minutes.  Pulses doppler-able and returned to +2 distally after 15 minutes of pressure.  Ecchymosis visible at 1815. Marked by this nurse.  Oozing stopped at 1817. This nurse applied gauze and tegaderm. Patient to remain flat overnight. Restart vascular checks q15 x 4, q30x 2, q1 x 4, then q2 per Dr. Estanislado Pandy verbal order.  Verbal order for 10 lb sandbag if necessary. This nurse did not place sandbag.

## 2019-12-21 NOTE — H&P (Deleted)
  The note originally documented on this encounter has been moved the the encounter in which it belongs.  

## 2019-12-21 NOTE — Transfer of Care (Signed)
Immediate Anesthesia Transfer of Care Note  Patient: Tanya [name]  Procedure(s) Performed: EMBOLIZATION (N/A )  Patient Location: PACU  Anesthesia Type:General  Level of Consciousness: awake, alert  and oriented  Airway & Oxygen Therapy: Patient Spontanous Breathing and Patient connected to face mask oxygen  Post-op Assessment: Report given to RN and Post -op Vital signs reviewed and stable  Post vital signs: Reviewed and stable  Last Vitals:  Vitals Value Taken Time  BP 155/75 12/21/19 1217  Temp    Pulse 73 12/21/19 1219  Resp 14 12/21/19 1219  SpO2 100 % 12/21/19 1219  Vitals shown include unvalidated device data.  Last Pain: There were no vitals filed for this visit.       Complications: No apparent anesthesia complications

## 2019-12-21 NOTE — Progress Notes (Addendum)
ANTICOAGULATION CONSULT NOTE  Pharmacy Consult for Heparin Indication: Post neuro-radiology procedure  No Known Allergies  Patient Measurements: Height: 5\' 4"  (162.6 cm) Weight: 139 lb 12.4 oz (63.4 kg) IBW/kg (Calculated) : 54.7 Heparin Dosing Weight: 59 kg  Vital Signs: Temp: 98.2 F (36.8 C) (03/22 2000) Temp Source: Oral (03/22 2000) BP: 127/65 (03/22 1900) Pulse Rate: 82 (03/22 1915)  Labs: Recent Labs    12/21/19 0549 12/21/19 2000  HGB 12.7  --   HCT 38.7  --   PLT 301  --   LABPROT 12.9  --   INR 1.0  --   HEPARINUNFRC  --  0.69  CREATININE 0.73  --     Estimated Creatinine Clearance: 56.5 mL/min (by C-G formula based on SCr of 0.73 mg/dL).   Medical History: Past Medical History:  Diagnosis Date  . Aneurysm ([place])    left ICA 09/2019  . Anhedonia   . Anxiety   . CAD (coronary artery disease)    a. s/p DESx2 to proximal LAD in 06/2018 b. cath in 04/2019 showing 40% mid-LAD ISR with no significant stenosis along LM, LCx, or RCA  . Cystitis   . Elevated liver enzymes   . Fatty liver   . GERD (gastroesophageal reflux disease)   . Headache    migraine  . Hyperlipidemia   . Hypertension   . Insomnia   . Unspecified hypothyroidism   . Vaginitis   . Wears glasses   . Wears hearing aid    B/L     Assessment: 69 yo female S/P Lt common carotid arteriogram followed by embolization of Lt ICA wide neck PCOM aneurysm using a 60mmx 12 mm evilve surpass flow diverter.  Started on Heparin 500 units/hr.  Pharmacy consulted for heparin dosing.  Heparin was off due to bleeding this afternoon for approximately 1.5 hours- now stabilized. Heparin level this evening came supratherapeutic at 0.69, on 500 units/hr. Level was drawn correctly per nursing. Hgb 12.7, plt 301.   Goal of Therapy:  Heparin level 0.1-0.25 units/ml Monitor platelets by anticoagulation protocol: Yes   Plan:  Hold heparin infusion for 2 hours Restart heparin infusion at 300  units/hr Heparin to turn off on 3/23@0700  (stop time updated in chart)  Antonietta Jewel, PharmD, [place] Pharmacist  Phone: [phone]  Please check AMION for all [place] phone numbers After 10:00 PM, call [place] [phone] 12/21/2019, 9:50 PM

## 2019-12-21 NOTE — Procedures (Signed)
S/P Lt common carotid arteriogram  followed by embolization of Lt ICA wide neck PCOM aneurysm using a 37mmx 12 mm evilve surpass flow diverter. S.Fate Caster MD.  RT groin sheath replaced with an 48F angioseal . Distal pulses Palpable DP and PT bilaterally. Patient extubated. Awake. Denies any H/A,N/V ,visual  Or motor difficulties. Pupils 56mm Rt = LT . No facial asymmetry. Tongue midline. No pronation drift. Moves all 4s equally. Marland Kitchen S.Crescentia Boutwell MD

## 2019-12-21 NOTE — Care Management CC44 (Signed)
Condition Code 44 Documentation Completed  Patient Details  Name: Tanya [name] MRN: [id number] Date of Birth: 01-13-1951   Condition Code 44 given:  Yes Patient signature on Condition Code 44 notice:  Yes Documentation of 2 MD's agreement:  Yes Code 44 added to claim:  Yes    Marilu Favre, RN 12/21/2019, 2:43 PM

## 2019-12-21 NOTE — Progress Notes (Incomplete)
Verbal order from Dr. Estanislado Pandy for STAT ACT. ?

## 2019-12-21 NOTE — Sedation Documentation (Signed)
Quick clot applied for continued oozing from groin site. Direct pressure held at this time.

## 2019-12-21 NOTE — Anesthesia Procedure Notes (Signed)
Arterial Line Insertion Start/End3/22/2021 8:25 AM Performed by: Candis Shine, CRNA, CRNA  Patient location: Pre-op. Preanesthetic checklist: patient identified, IV checked, site marked, risks and benefits discussed, surgical consent, monitors and equipment checked, pre-op evaluation, timeout performed and anesthesia consent Lidocaine 1% used for infiltration Left, radial was placed Catheter size: 20 G Hand hygiene performed  and maximum sterile barriers used   Attempts: 2 Procedure performed without using ultrasound guided technique. Following insertion, dressing applied and Biopatch. Post procedure assessment: normal and unchanged  Patient tolerated the procedure well with no immediate complications.

## 2019-12-21 NOTE — TOC Initial Note (Signed)
Transition of Care [facility]) - Initial/Assessment Note    Patient Details  Name: Tanya [name] MRN: [id number] Date of Birth: 1951/07/14  [facility] CM/SW Contact:    Marilu Favre, RN Phone Number: 12/21/2019, 2:42 PM  Clinical Narrative:                  Patient from home with husband . Will continue to follow for discharge needs.  Expected Discharge Plan: Home/Self Care Barriers to Discharge: Continued Medical Work up   Patient Goals and CMS Choice Patient states their goals for this hospitalization and ongoing recovery are:: to return to hoem CMS Medicare.gov Compare Post Acute Care list provided to:: Patient Choice offered to / list presented to : NA  Expected Discharge Plan and Services Expected Discharge Plan: Home/Self Care       Living arrangements for the past 2 months: Single Family Home                 DME Arranged: N/A         HH Arranged: NA          Prior Living Arrangements/Services Living arrangements for the past 2 months: Single Family Home Lives with:: Spouse Patient language and need for interpreter reviewed:: Yes Do you feel safe going back to the place where you live?: Yes      Need for Family Participation in Patient Care: Yes (Comment) Care giver support system in place?: Yes (comment)   Criminal Activity/Legal Involvement Pertinent to Current Situation/Hospitalization: No - Comment as needed  Activities of Daily Living Home Assistive Devices/Equipment: Eyeglasses, Hearing aid ADL Screening (condition at time of admission) Patient's cognitive ability adequate to safely complete daily activities?: No Is the patient deaf or have difficulty hearing?: No Does the patient have difficulty seeing, even when wearing glasses/contacts?: No Does the patient have difficulty concentrating, remembering, or making decisions?: No Patient able to express need for assistance with ADLs?: Yes Does the patient have difficulty dressing or  bathing?: No Independently performs ADLs?: Yes (appropriate for developmental age) Does the patient have difficulty walking or climbing stairs?: No Weakness of Legs: None Weakness of Arms/Hands: None  Permission Sought/Granted   Permission granted to share information with : Yes, Verbal Permission Granted  Share Information with NAME: Darnelle Spangle Husband           Emotional Assessment              Admission diagnosis:  Brain aneurysm [I67.1] Patient Active Problem List   Diagnosis Date Noted  . Brain aneurysm 12/21/2019  . 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 Foamy urine 10/20/2019  . Sensory disturbance 06/29/2019  . Hospital discharge follow-up 06/21/2019  . Hypokalemia 06/15/2019  . Need for shingles vaccine 03/25/2019  . Educated about COVID-19 virus infection 02/15/2019  . CAD (coronary artery disease), native coronary artery 06/06/2018  . Headache 03/08/2018  . Primary osteoarthritis of both hands 02/13/2018  . Primary osteoarthritis of both knees 02/13/2018  . Anxiety 02/06/2017  . Pruritus 07/20/2016  . Arthritis 07/20/2016  . Tubular adenoma of colon 02/11/2016  . Coronary artery calcification seen on CAT scan - Moderate LAD lesion noted 11/16/2014  . Abnormal CT scan, chest 11/08/2014  . IGT (impaired glucose tolerance) 06/11/2014  . Osteopenia 01/27/2014  . Dyspnea 01/19/2014  . Microscopic hematuria 01/05/2013  . Urinary incontinence, mixed 01/05/2013  . GERD (gastroesophageal reflux disease) 12/16/2009  . Chest pain 03/01/2009  . Hyperlipemia 04/09/2008  . Essential  hypertension 04/09/2008  . Insomnia disorder 04/09/2008   PCP:  [name], MD Pharmacy:   [facility] [address], [place] - [place] [address] [place] [address] [place] [address] Phone: [phone] Fax: [phone]  [place], [place] [facility] [address] [place] [address] [place] [address] Phone:  [phone] Fax: [phone]     Social Determinants of Health (SDOH) Interventions    Readmission Risk Interventions No flowsheet data found.

## 2019-12-21 NOTE — Care Management Obs Status (Signed)
[place] NOTIFICATION   Patient Details  Name: Tanya [name] MRN: [id number] Date of Birth: 01-23-1951   Medicare Observation Status Notification Given:  Yes    Marilu Favre, RN 12/21/2019, 2:43 PM

## 2019-12-21 NOTE — H&P (Signed)
Chief Complaint: Patient was seen in consultation today for cerebral arteriogram with possible left internal carotid artery aneurysm embolization   Referring Physician(s): Dr Norton Blizzard  Supervising Physician: Luanne Bras  Patient Status: [facility] - Out-pt  History of Present Illness: Tanya [name] is a 69 y.o. 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  Known to Dr Estanislado Pandy since 09/2019 after referral from Dr Merlene Laughter Noted incidental L ICA aneurysm on MRI 06/29/19 for Rt blurred vision and headaches Denies speech changes Denies numbness; tingling` Denies gait instability  Was scheduled for cerebral arteriogram with possible L ICA aneurysm embolization 11/23/19--- was rescheduled secondary P2y12  6  On that day Was changed to Plavix 37.5 mg daily at that time  Taking ASA 81 mg and Plavix 37.5 mg daily P2y12 125 today   Past Medical History:  Diagnosis Date  . Aneurysm ([place])    left ICA 09/2019  . Anhedonia   . Anxiety   . CAD (coronary artery disease)    a. s/p DESx2 to proximal LAD in 06/2018 b. cath in 04/2019 showing 40% mid-LAD ISR with no significant stenosis along LM, LCx, or RCA  . Cystitis   . Elevated liver enzymes   . Fatty liver   . GERD (gastroesophageal reflux disease)   . Headache    migraine  . Hyperlipidemia   . Hypertension   . Insomnia   . Unspecified hypothyroidism   . Vaginitis   . Wears glasses   . Wears hearing aid    B/L    Past Surgical History:  Procedure Laterality Date  . CATARACT EXTRACTION W/PHACO Right 05/08/2019   Procedure: CATARACT EXTRACTION PHACO AND INTRAOCULAR LENS PLACEMENT (IOC);  Surgeon: Baruch Goldmann, MD;  Location: [facility] ORS;  Service: Ophthalmology;  Laterality: Right;  CDE: 10.02  . COLONOSCOPY N/A 02/08/2016   Procedure: COLONOSCOPY;  Surgeon: [name], MD;  Location: [facility] ENDO SUITE;  Service: Endoscopy;  Laterality: N/A;  1200 - moved to 11:45 - Ann to notify  . CORONARY STENT INTERVENTION N/A 06/06/2018   Procedure: CORONARY STENT INTERVENTION;  Surgeon: [name], [name], MD;  Location: [place] CV LAB;  Service: Cardiovascular;  Laterality: N/A;  . INTRAVASCULAR PRESSURE WIRE/FFR STUDY N/A 06/06/2018   Procedure: INTRAVASCULAR PRESSURE WIRE/FFR STUDY;  Surgeon: [name], [name], MD;  Location: [place] CV LAB;  Service: Cardiovascular;  Laterality: N/A;  . IR ANGIO INTRA EXTRACRAN SEL COM CAROTID INNOMINATE BILAT MOD SED  09/01/2019  . IR ANGIO VERTEBRAL SEL VERTEBRAL BILAT MOD SED  09/01/2019  . LEFT HEART CATH AND CORONARY ANGIOGRAPHY N/A 04/24/2019   Procedure: LEFT HEART CATH AND CORONARY ANGIOGRAPHY;  Surgeon: Belva Crome, MD;  Location: [place] CV LAB;  Service: Cardiovascular;  Laterality: N/A;  . LEFT HEART CATHETERIZATION WITH CORONARY ANGIOGRAM N/A 11/19/2014   Procedure: LEFT HEART CATHETERIZATION WITH CORONARY ANGIOGRAM;  Surgeon: [name], MD;  Location: [facility] CATH LAB;  Service: Cardiovascular;  Laterality: N/A;  . RADIOLOGY WITH ANESTHESIA N/A 11/23/2019   Procedure: Drucilla Schmidt;  Surgeon: Luanne Bras, MD;  Location: [place];  Service: Radiology;  Laterality: N/A;  . RIGHT/LEFT HEART CATH AND CORONARY ANGIOGRAPHY N/A 06/06/2018   Procedure: RIGHT/LEFT HEART CATH AND CORONARY ANGIOGRAPHY;  Surgeon: [name], [name], MD;  Location: [place] CV LAB;  Service: Cardiovascular;  Laterality: N/A;  Allergies: Patient has no known allergies.  Medications: Prior to Admission medications   Medication Sig Start Date End Date Taking? Authorizing Provider  acetaminophen (TYLENOL) 500 MG tablet Take 1,000 mg by mouth every 6 (six) hours as needed for moderate pain.    [provider]  amLODipine (NORVASC) 2.5 MG tablet Take 1 tablet (2.5 mg total) by mouth daily.  10/26/19   [name], MD  Ascorbic Acid (VITAMIN C) 1000 MG tablet Take 1,000 mg by mouth daily.     [provider]  aspirin EC 81 MG tablet Take 81 mg by mouth at bedtime.    [provider]  Biotin 5000 MCG CAPS Take 5,000 mcg by mouth daily.    [provider]  busPIRone (BUSPAR) 5 MG tablet Take 5 mg by mouth 2 (two) times daily.    [provider]  Carboxymethylcellulose Sodium (THERATEARS OP) Place 1 drop into both eyes 2 (two) times daily.     [provider]  cloNIDine (CATAPRES) 0.2 MG tablet TAKE 1 TABLET BY MOUTH AT  BEDTIME FOR BLOOD PRESSURE Patient taking differently: Take 0.2 mg by mouth at bedtime.  11/23/19   [name], MD  clopidogrel (PLAVIX) 75 MG tablet Take 1 tablet (75 mg total) by mouth daily. 09/23/19   [name], MD  diphenhydrAMINE (BENADRYL) 25 MG tablet Take 50 mg by mouth daily as needed for itching.     [provider]  nitroGLYCERIN (NITROSTAT) 0.4 MG SL tablet Place 1 tablet (0.4 mg total) under the tongue every 5 (five) minutes as needed. Patient taking differently: Place 0.4 mg under the tongue every 5 (five) minutes x 3 doses as needed for chest pain.  06/06/18   Cheryln Manly, NP  Omega-3 Fatty Acids (FISH OIL) 1000 MG CAPS Take 1,000 mg by mouth daily.    [provider]  pantoprazole (PROTONIX) 40 MG tablet Take 1 tablet (40 mg total) by mouth daily. 09/23/19   [name], MD  potassium chloride SA (KLOR-CON) 20 MEQ tablet Take 1 tablet (20 mEq total) by mouth daily. 09/02/19   [name], MD  rosuvastatin (CRESTOR) 40 MG tablet TAKE 1 TABLET BY MOUTH  DAILY Patient taking differently: Take 40 mg by mouth daily.  11/19/19   Perlie Mayo, NP  temazepam (RESTORIL) 15 MG capsule Take 1 capsule (15 mg total) by mouth at bedtime as needed for sleep. Patient taking differently: Take 15 mg by mouth at bedtime.  11/18/19   Perlie Mayo, NP  topiramate  (TOPAMAX) 25 MG tablet Take 50 mg by mouth at bedtime.  07/13/19   [provider]     Family History  Problem Relation Age of Onset  . Dementia Mother   . Heart disease Mother        heart attack   . Hypertension Mother   . Heart attack Father   . Heart disease Father        heart attack   . Hypertension Father     Social History   Socioeconomic History  . Marital status: Married    Spouse name: Not on file  . Number of children: Not on file  . Years of education: Not on file  . Highest education level: Not on file  Occupational History  . Not on file  Tobacco Use  . Smoking status: Never Smoker  . Smokeless tobacco: Never Used  Substance and Sexual Activity  . Alcohol use:  No    Alcohol/week: 0.0 standard drinks  . Drug use: No  . Sexual activity: Not on file  Other Topics Concern  . Not on file  Social History Narrative   Pt originally from the [place] been in [place] for 20 years     Social Determinants of Radio broadcast assistant Strain:   . Difficulty of Paying Living Expenses:   Food Insecurity:   . Worried About Charity fundraiser in the Last Year:   . Arboriculturist in the Last Year:   Transportation Needs:   . Film/video editor (Medical):   Marland Kitchen Lack of Transportation (Non-Medical):   Physical Activity:   . Days of Exercise per Week:   . Minutes of Exercise per Session:   Stress:   . Feeling of Stress :   Social Connections:   . Frequency of Communication with Friends and Family:   . Frequency of Social Gatherings with Friends and Family:   . Attends Religious Services:   . Active Member of Clubs or Organizations:   . Attends Archivist Meetings:   Marland Kitchen Marital Status:     Review of Systems: A 12 point ROS discussed and pertinent positives are indicated in the HPI above.  All other systems are negative.  Review of Systems  Constitutional: Negative for activity change, fatigue, fever and unexpected weight change.  HENT:  Negative for hearing loss, tinnitus and trouble swallowing.   Eyes: Negative for visual disturbance.  Respiratory: Negative for cough and shortness of breath.   Cardiovascular: Negative for chest pain.  Gastrointestinal: Negative for abdominal pain.  Musculoskeletal: Negative for back pain and gait problem.  Neurological: Negative for dizziness, tremors, seizures, syncope, facial asymmetry, speech difficulty, weakness, light-headedness, numbness and headaches.  Psychiatric/Behavioral: Negative for behavioral problems and confusion.    Vital Signs: BP 140/70   Pulse (!) 55   Temp 98.1 F (36.7 C) (Oral)   Resp 17   Ht 5\' 4"  (1.626 m)   Wt 130 lb (59 kg)   SpO2 99%   BMI 22.31 kg/m   Physical Exam Vitals reviewed.  HENT:     Head: Atraumatic.     Comments: Face symmetrical Tongue midline    Mouth/Throat:     Mouth: Mucous membranes are moist.  Eyes:     Extraocular Movements: Extraocular movements intact.  Cardiovascular:     Rate and Rhythm: Normal rate and regular rhythm.     Heart sounds: Normal heart sounds.  Pulmonary:     Effort: Pulmonary effort is normal.     Breath sounds: Normal breath sounds.  Abdominal:     Palpations: Abdomen is soft.  Musculoskeletal:        General: No swelling or tenderness. Normal range of motion.     Cervical back: Normal range of motion.     Right lower leg: No edema.     Left lower leg: No edema.  Skin:    General: Skin is warm and dry.  Neurological:     Mental Status: She is alert and oriented to person, place, and time.  Psychiatric:        Mood and Affect: Mood normal.        Behavior: Behavior normal.        Thought Content: Thought content normal.        Judgment: Judgment normal.     Imaging: MM 3D SCREEN BREAST BILATERAL  Result Date: 12/07/2019 CLINICAL DATA:  Screening. EXAM:  DIGITAL SCREENING BILATERAL MAMMOGRAM WITH TOMO AND CAD COMPARISON:  Previous exam(s). ACR Breast Density Category c: The breast tissue is  heterogeneously dense, which may obscure small masses. FINDINGS: There are no findings suspicious for malignancy. Images were processed with CAD. IMPRESSION: No mammographic evidence of malignancy. A result letter of this screening mammogram will be mailed directly to the patient. RECOMMENDATION: Screening mammogram in one year. (Code:SM-B-01Y) BI-RADS CATEGORY  1: Negative. Electronically Signed   By: Margarette Canada M.D.   On: 12/07/2019 14:35    Labs:  CBC: Recent Labs    06/29/19 0946 09/01/19 0624 11/23/19 0655 12/21/19 0549  WBC 4.9 5.2 4.9 4.9  HGB 13.1 12.7 13.5 12.7  HCT 39.9 37.6 41.0 38.7  PLT 355 319 306 301    COAGS: Recent Labs    06/15/19 1442 06/15/19 1442 06/29/19 0005 09/01/19 0624 11/23/19 0655 12/21/19 0549  INR 1.0   < > 1.0 1.0 1.0 1.0  APTT 26  --  27  --  27  --    < > = values in this interval not displayed.    BMP: Recent Labs    09/01/19 0624 10/21/19 1019 11/23/19 0655 12/21/19 0549  NA 141 143 140 141  K 2.9* 3.5 3.8 3.6  CL 110 109 108 107  CO2 22 27 21* 22  GLUCOSE 98 118 98 102*  BUN 13 18 17 23   CALCIUM 9.7 10.0 9.4 9.3  CREATININE 0.66 0.68 0.74 0.73  GFRNONAA >60 89 >60 >60  GFRAA >60 103 >60 >60    LIVER FUNCTION TESTS: Recent Labs    06/15/19 1442 06/15/19 1442 06/16/19 0549 06/29/19 0005 06/29/19 0946 07/13/19 1414  BILITOT 0.6   < > 0.7 0.7 0.8 0.4  AST 28   < > 22 27 23 21   ALT 21   < > 19 19 17 14   ALKPHOS 54  --  44 55 48  --   PROT 7.8   < > 6.8 7.5 7.0 7.2  ALBUMIN 4.5  --  3.8 4.5 4.1  --    < > = values in this interval not displayed.    TUMOR MARKERS: No results for input(s): AFPTM, CEA, CA199, CHROMGRNA in the last 8760 hours.  Assessment and Plan:  Left internal carotid artery aneurysm Scheduled for cerebral arteriogram with possible aneurysm embolization P2y12 125 today Discussed with Dr Estanislado Pandy-- Plavix 150 mg now and move ahead Risks and benefits of cerebral angiogram with intervention were  discussed with the patient including, but not limited to bleeding, infection, vascular injury, contrast induced renal failure, stroke or even death.  This interventional procedure involves the use of X-rays and because of the nature of the planned procedure, it is possible that we will have prolonged use of X-ray fluoroscopy.  Potential radiation risks to you include (but are not limited to) the following: - A slightly elevated risk for cancer  several years later in life. This risk is typically less than 0.5% percent. This risk is low in comparison to the normal incidence of human cancer, which is 33% for women and 50% for men according to the [place]. - Radiation induced injury can include skin redness, resembling a rash, tissue breakdown / ulcers and hair loss (which can be temporary or permanent).   The likelihood of either of these occurring depends on the difficulty of the procedure and whether you are sensitive to radiation due to previous procedures, disease, or genetic conditions.   IF your  procedure requires a prolonged use of radiation, you will be notified and given written instructions for further action.  It is your responsibility to monitor the irradiated area for the 2 weeks following the procedure and to notify your physician if you are concerned that you have suffered a radiation induced injury.    All of the patient's questions were answered, patient is agreeable to proceed. Consent signed and in chart.  Thank you for this interesting consult.  I greatly enjoyed meeting Sibel A Hiraldo and look forward to participating in their care.  A copy of this report was sent to the requesting provider on this date.  Electronically Signed: Lavonia Drafts, PA-C 12/21/2019, 7:32 AM   I spent a total of    25 Minutes in face to face in clinical consultation, greater than 50% of which was counseling/coordinating care for L ICA aneurysm embolization

## 2019-12-21 NOTE — Progress Notes (Signed)
NIR.  Patient underwent an image-guided cerebral arteriogram with embolization of left ICA/PCOM aneurysm using an Evolve Surpass flow diverter this AM by Dr. Estanislado Pandy.  Evaluated patient in neuro ICU alongside Dr. Estanislado Pandy following procedure. Patient awake and alert laying in bed with no complaints at this time. Asking to go home. Denies headache, weakness, numbness/tingling, dizziness, vision changes, hearing changes, tinnitus, or speech difficulty.  Alert, awake, and oriented x3. Speech and comprehension intact PERRL bilaterally. EOMs intact bilaterally without nystagmus or subjective diplopia. No facial asymmetry. Tongue midline. Can spontaneously move all extremities. no pronator drift. Fine motor and coordination intact and symmetric. Distal pulses 2+ bilaterally. Right groin incision soft without active bleeding or hematoma.  Plan to stay in neuro ICU for overnight observation. SBP goal = 0000000 systolic. Advance diet as tolerated to heart healthy. Continue taking Plavix 75 mg once daily and Aspirin 325 mg once daily. NIR to follow.   Bea Graff Karole Oo, PA-C 12/21/2019, 3:40 PM

## 2019-12-21 NOTE — Anesthesia Procedure Notes (Signed)
Procedure Name: Intubation Date/Time: 12/21/2019 9:27 AM Performed by: Candis Shine, CRNA Pre-anesthesia Checklist: Patient identified, Emergency Drugs available, Suction available and Patient being monitored Patient Re-evaluated:Patient Re-evaluated prior to induction Oxygen Delivery Method: Circle System Utilized Preoxygenation: Pre-oxygenation with 100% oxygen Induction Type: IV induction Ventilation: Mask ventilation without difficulty Laryngoscope Size: Mac and 3 Grade View: Grade I Tube type: Oral Tube size: 7.0 mm Number of attempts: 1 Airway Equipment and Method: Stylet Placement Confirmation: ETT inserted through vocal cords under direct vision,  positive ETCO2 and breath sounds checked- equal and bilateral Secured at: 21 cm Tube secured with: Tape Dental Injury: Teeth and Oropharynx as per pre-operative assessment

## 2019-12-21 NOTE — Sedation Documentation (Signed)
Right groin sheath removed. 8Fr. angioseal deployed 

## 2019-12-21 NOTE — Sedation Documentation (Signed)
Bedside handoff given to PACU RN

## 2019-12-21 NOTE — Sedation Documentation (Signed)
Patient arrives to IR2, alert and oriented, moving all extremities equally and purposefully. Transferred to table with assistance.

## 2019-12-21 NOTE — Progress Notes (Signed)
ANTICOAGULATION CONSULT NOTE - Initial Consult  Pharmacy Consult for Heparin Indication: Post neuro-radiology procedure  No Known Allergies  Patient Measurements:   Heparin Dosing Weight: 59 kg  Vital Signs: Temp: 97.6 F (36.4 C) (03/22 1217) Temp Source: Oral (03/22 0624) BP: 123/67 (03/22 1400) Pulse Rate: 70 (03/22 1400)  Labs: Recent Labs    12/21/19 0549  HGB 12.7  HCT 38.7  PLT 301  LABPROT 12.9  INR 1.0  CREATININE 0.73    Estimated Creatinine Clearance: 56.5 mL/min (by C-G formula based on SCr of 0.73 mg/dL).   Medical History: Past Medical History:  Diagnosis Date  . Aneurysm ([place])    left ICA 09/2019  . Anhedonia   . Anxiety   . CAD (coronary artery disease)    a. s/p DESx2 to proximal LAD in 06/2018 b. cath in 04/2019 showing 40% mid-LAD ISR with no significant stenosis along LM, LCx, or RCA  . Cystitis   . Elevated liver enzymes   . Fatty liver   . GERD (gastroesophageal reflux disease)   . Headache    migraine  . Hyperlipidemia   . Hypertension   . Insomnia   . Unspecified hypothyroidism   . Vaginitis   . Wears glasses   . Wears hearing aid    B/L     Assessment: 69 yo female S/P Lt common carotid arteriogram followed by embolization of Lt ICA wide neck PCOM aneurysm using a 57mmx 12 mm evilve surpass flow diverter.  Started on Heparin 500 units/hr.  Pharmacy consulted for heparin dosing  Goal of Therapy:  Heparin level 0.1-0.25 units/ml Monitor platelets by anticoagulation protocol: Yes   Plan:  Continue Heparin 500 units/hr Draw heparin level in 6 hours  Alanda Slim, PharmD, [facility] Clinical Pharmacist Please see AMION for all Pharmacists' Contact Phone Numbers 12/21/2019, 2:14 PM

## 2019-12-22 ENCOUNTER — Observation Stay (HOSPITAL_BASED_OUTPATIENT_CLINIC_OR_DEPARTMENT_OTHER): Payer: Medicare Other

## 2019-12-22 ENCOUNTER — Encounter: Payer: Self-pay | Admitting: *Deleted

## 2019-12-22 DIAGNOSIS — I724 Aneurysm of artery of lower extremity: Secondary | ICD-10-CM

## 2019-12-22 DIAGNOSIS — K219 Gastro-esophageal reflux disease without esophagitis: Secondary | ICD-10-CM | POA: Diagnosis not present

## 2019-12-22 DIAGNOSIS — Z8249 Family history of ischemic heart disease and other diseases of the circulatory system: Secondary | ICD-10-CM | POA: Diagnosis not present

## 2019-12-22 DIAGNOSIS — Z7982 Long term (current) use of aspirin: Secondary | ICD-10-CM | POA: Diagnosis not present

## 2019-12-22 DIAGNOSIS — I2510 Atherosclerotic heart disease of native coronary artery without angina pectoris: Secondary | ICD-10-CM | POA: Diagnosis not present

## 2019-12-22 DIAGNOSIS — E039 Hypothyroidism, unspecified: Secondary | ICD-10-CM | POA: Diagnosis not present

## 2019-12-22 DIAGNOSIS — Z79899 Other long term (current) drug therapy: Secondary | ICD-10-CM | POA: Diagnosis not present

## 2019-12-22 DIAGNOSIS — Z955 Presence of coronary angioplasty implant and graft: Secondary | ICD-10-CM | POA: Diagnosis not present

## 2019-12-22 DIAGNOSIS — G4700 Insomnia, unspecified: Secondary | ICD-10-CM | POA: Diagnosis not present

## 2019-12-22 DIAGNOSIS — R1030 Lower abdominal pain, unspecified: Secondary | ICD-10-CM | POA: Diagnosis not present

## 2019-12-22 DIAGNOSIS — E785 Hyperlipidemia, unspecified: Secondary | ICD-10-CM | POA: Diagnosis not present

## 2019-12-22 DIAGNOSIS — Z7902 Long term (current) use of antithrombotics/antiplatelets: Secondary | ICD-10-CM | POA: Diagnosis not present

## 2019-12-22 DIAGNOSIS — K760 Fatty (change of) liver, not elsewhere classified: Secondary | ICD-10-CM | POA: Diagnosis not present

## 2019-12-22 DIAGNOSIS — I10 Essential (primary) hypertension: Secondary | ICD-10-CM | POA: Diagnosis not present

## 2019-12-22 DIAGNOSIS — I671 Cerebral aneurysm, nonruptured: Secondary | ICD-10-CM | POA: Diagnosis not present

## 2019-12-22 LAB — CBC WITH DIFFERENTIAL/PLATELET
Abs Immature Granulocytes: 0.02 10*3/uL (ref 0.00–0.07)
Basophils Absolute: 0 10*3/uL (ref 0.0–0.1)
Basophils Relative: 1 %
Eosinophils Absolute: 0 10*3/uL (ref 0.0–0.5)
Eosinophils Relative: 0 %
HCT: 33 % — ABNORMAL LOW (ref 36.0–46.0)
Hemoglobin: 11.1 g/dL — ABNORMAL LOW (ref 12.0–15.0)
Immature Granulocytes: 0 %
Lymphocytes Relative: 24 %
Lymphs Abs: 1.5 10*3/uL (ref 0.7–4.0)
MCH: 31.4 pg (ref 26.0–34.0)
MCHC: 33.6 g/dL (ref 30.0–36.0)
MCV: 93.5 fL (ref 80.0–100.0)
Monocytes Absolute: 0.5 10*3/uL (ref 0.1–1.0)
Monocytes Relative: 8 %
Neutro Abs: 4.4 10*3/uL (ref 1.7–7.7)
Neutrophils Relative %: 67 %
Platelets: 264 10*3/uL (ref 150–400)
RBC: 3.53 MIL/uL — ABNORMAL LOW (ref 3.87–5.11)
RDW: 13.5 % (ref 11.5–15.5)
WBC: 6.5 10*3/uL (ref 4.0–10.5)
nRBC: 0 % (ref 0.0–0.2)

## 2019-12-22 LAB — BASIC METABOLIC PANEL
Anion gap: 9 (ref 5–15)
Anion gap: 8 (ref 5–15)
BUN: 13 mg/dL (ref 8–23)
BUN: 13 mg/dL (ref 8–23)
CO2: 20 mmol/L — ABNORMAL LOW (ref 22–32)
CO2: 21 mmol/L — ABNORMAL LOW (ref 22–32)
Calcium: 8.4 mg/dL — ABNORMAL LOW (ref 8.9–10.3)
Calcium: 8.6 mg/dL — ABNORMAL LOW (ref 8.9–10.3)
Chloride: 113 mmol/L — ABNORMAL HIGH (ref 98–111)
Chloride: 114 mmol/L — ABNORMAL HIGH (ref 98–111)
Creatinine, Ser: 0.6 mg/dL (ref 0.44–1.00)
Creatinine, Ser: 0.54 mg/dL (ref 0.44–1.00)
GFR calc Af Amer: >60 mL/min (ref >60)
GFR calc Af Amer: >60 mL/min (ref >60)
GFR calc non Af Amer: >60 mL/min (ref >60)
GFR calc non Af Amer: >60 mL/min (ref >60)
Glucose, Bld: 110 mg/dL — ABNORMAL HIGH (ref 70–99)
Glucose, Bld: 108 mg/dL — ABNORMAL HIGH (ref 70–99)
Potassium: 2.7 mmol/L — CL (ref 3.5–5.1)
Potassium: 2.9 mmol/L — ABNORMAL LOW (ref 3.5–5.1)
Sodium: 142 mmol/L (ref 135–145)
Sodium: 143 mmol/L (ref 135–145)

## 2019-12-22 LAB — PLATELET INHIBITION P2Y12: Platelet Function  P2Y12: 32 [PRU] — ABNORMAL LOW (ref 182–335)

## 2019-12-22 LAB — POCT ACTIVATED CLOTTING TIME
Activated Clotting Time: 186 seconds
Activated Clotting Time: 279 seconds
Activated Clotting Time: 290 seconds
Activated Clotting Time: 257 seconds

## 2019-12-22 LAB — TRIGLYCERIDES: Triglycerides: 84 mg/dL (ref <150)

## 2019-12-22 LAB — HEPARIN LEVEL (UNFRACTIONATED): Heparin Unfractionated: 0.16 IU/mL — ABNORMAL LOW (ref 0.30–0.70)

## 2019-12-22 MED ORDER — POTASSIUM CHLORIDE CRYS ER 20 MEQ PO TBCR
20.0000 meq | EXTENDED_RELEASE_TABLET | Freq: Once | ORAL | Status: AC
  Administered 2019-12-22: 07:00:00 20 meq via ORAL
  Filled 2019-12-22: qty 1

## 2019-12-22 NOTE — Discharge Instructions (Signed)
Endovascular Therapy for Cerebral Aneurysm, Care After This sheet gives you information about how to care for yourself after your procedure. Your health care provider may also give you more specific instructions. If you have problems or questions, contact your health care provider. What can I expect after the procedure? After the procedure, it is common to have:  Pain, tenderness, and swelling around your incision.  Headaches. Follow these instructions at home: Medicines  Take all home medications as directed.  Continue to take Plavix 75 mg once daily and Aspirin 81 mg once daily. Incision care  Follow instructions from your health care provider about how to take care of your incision. Make sure you: ? Wash your hands with soap and water before you touch your incision. ? Ensure right groin incision remains clean and dry.  Check your incision area every day for signs of infection. Check for: ? Redness, swelling, or pain. ? Fluid or blood. ? Warmth. ? Pus or a bad smell. Activity  Ask your health care provider what activities are safe for you during recovery. Most people can return to normal activities 2-6 weeks after the procedure.  Do not drive until your health care provider approves.  Do not drive or use heavy machinery while taking prescription pain medicine.  Do not lift anything that is heavier than 10 lb (4.5 kg), or the limit that you are told, until your health care provider says that it is safe.  Exercise regularly, as directed by your health care provider. Eating and drinking  Drink enough fluid to keep your urine pale yellow.  Eat a healthy diet. This includes plenty of fruits and vegetables, whole grains, low-fat dairy products, and lean protein. General instructions  Do not use any products that contain nicotine or tobacco, such as cigarettes and e-cigarettes. If you need help quitting, ask your health care provider.  Do not take baths, swim, or use a hot tub  for 7 days post-procedure. Ok to shower 72 hours post-procedure.  Manage your stress. If you need help with this, talk with your health care provider.  Keep all follow-up visits as told by your health care provider. This is important. Contact a health care provider if:  You have redness, swelling, or pain around your incision.  You have fluid or blood coming from your incision.  Your incision feels warm to the touch.  You have pus or a bad smell coming from your incision.  You have a fever. Get help right away if:  You have: ? Stiffness in your neck. ? Pain, numbness, weakness, or swelling in your legs. ? Severe chest pain. ? Difficulty breathing. ? Confusion.  You have any symptoms of stroke. "BE FAST" is an easy way to remember the main warning signs of stroke: ? B - Balance. Signs are dizziness, sudden trouble walking, or loss of balance. ? E - Eyes. Signs are trouble seeing or a sudden change in vision. ? F - Face. Signs are sudden weakness or numbness of the face, or the face or eyelid drooping on one side. ? A - Arms. Signs are weakness or numbness in an arm. This happens suddenly and usually on one side of the body. ? S - Speech. Signs are sudden trouble speaking, slurred speech, or trouble understanding what people say. ? T - Time. Time to call emergency services. Write down what time symptoms started.  You have other signs of stroke, such as: ? A sudden, severe headache that does not get better  with medicine. ? Sudden nausea or vomiting. ? A seizure. These symptoms may represent a serious problem that is an emergency. Do not wait to see if the symptoms will go away. Get medical help right away. Call your local emergency services (911 in the [place] Do not drive yourself to the hospital. Summary  After this procedure, it is common to have some pain and swelling around your incision.  Follow instructions from your health care provider about how to take care of your  incision. Check for signs of infection every day.  Do not drive until your health care provider approves. Do not drive while taking prescription pain medicine.  Do not use any products that contain nicotine or tobacco, such as cigarettes and e-cigarettes. If you need help quitting, ask your health care provider. This information is not intended to replace advice given to you by your health care provider. Make sure you discuss any questions you have with your health care provider. Document Revised: 11/07/2018 Document Reviewed: 12/24/2016 Elsevier Patient Education  [place].

## 2019-12-22 NOTE — Progress Notes (Signed)
CRITICAL VALUE ALERT  Critical Value: K  2.7   Date & Time Notied:  0630 12/22/2019  Provider Notified: Dr Estanislado Pandy   Orders Received/Actions taken: ordered to give 20 meq PO and recheck in 1 hr

## 2019-12-22 NOTE — Anesthesia Postprocedure Evaluation (Signed)
Anesthesia Post Note  Patient: Tanya [name]  Procedure(s) Performed: EMBOLIZATION (N/A )     Patient location during evaluation: PACU Anesthesia Type: General Level of consciousness: awake and alert Pain management: pain level controlled Vital Signs Assessment: post-procedure vital signs reviewed and stable Respiratory status: spontaneous breathing, nonlabored ventilation and respiratory function stable Cardiovascular status: blood pressure returned to baseline and stable Postop Assessment: no apparent nausea or vomiting Anesthetic complications: no    Last Vitals:  Vitals:   12/22/19 0500 12/22/19 0600  BP: 112/60 (!) 143/82  Pulse: 64 79  Resp: 13 15  Temp:    SpO2: 98% 100%    Last Pain:  Vitals:   12/22/19 0400  TempSrc: Oral  PainSc: 0-No pain                 Lynda Rainwater

## 2019-12-22 NOTE — Progress Notes (Signed)
Right groin pseudoaneurysm exam completed.  Preliminary results can be found under CV proc under chart review.  12/22/2019 10:38 AM  Iviona Hole, K., RDMS, RVT

## 2019-12-22 NOTE — Progress Notes (Signed)
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 Estanislado Pandy. Patient awake and alert laying in bed with no complaints at this time. Neurologically intact, moving all 4s. Right groin incision c/d/i.   Allergies: Patient has no known allergies.  Medications: Prior to Admission medications   Medication Sig Start Date End Date Taking? Authorizing Provider  acetaminophen (TYLENOL) 500 MG tablet Take 1,000 mg by mouth every 6 (six) hours as needed for moderate pain.   Yes [provider]  amLODipine (NORVASC) 2.5 MG tablet Take 1 tablet (2.5 mg total) by mouth daily. 10/26/19  Yes [name], MD  Ascorbic Acid (VITAMIN C) 1000 MG tablet Take 1,000 mg by mouth daily.    Yes [provider]  aspirin EC 81 MG tablet Take 81 mg by mouth at bedtime.   Yes [provider]  Biotin 5000 MCG CAPS Take 5,000 mcg by mouth daily.   Yes [provider]  busPIRone (BUSPAR) 5 MG tablet Take 5 mg by mouth 2 (two) times daily.   Yes [provider]  Carboxymethylcellulose Sodium (THERATEARS OP) Place 1 drop into both eyes 2 (two) times daily.    Yes [provider]  cloNIDine (CATAPRES) 0.2 MG tablet TAKE 1 TABLET BY MOUTH AT  BEDTIME FOR BLOOD PRESSURE Patient taking differently: Take 0.2 mg by mouth at bedtime.  11/23/19  Yes [name], MD  clopidogrel (PLAVIX) 75 MG tablet Take 1 tablet (75 mg total) by mouth daily. 09/23/19  Yes [name], MD  diphenhydrAMINE (BENADRYL) 25 MG tablet Take 50 mg by mouth daily as needed for itching.    Yes [provider]  nitroGLYCERIN (NITROSTAT) 0.4 MG SL tablet Place 1 tablet (0.4 mg total) under the tongue every 5 (five) minutes as needed. Patient taking differently: Place 0.4  mg under the tongue every 5 (five) minutes x 3 doses as needed for chest pain.  06/06/18  Yes Cheryln Manly, NP  Omega-3 Fatty Acids (FISH OIL) 1000 MG CAPS Take 1,000 mg by mouth daily.   Yes [provider]  pantoprazole (PROTONIX) 40 MG tablet Take 1 tablet (40 mg total) by mouth daily. 09/23/19  Yes [name], MD  potassium chloride SA (KLOR-CON) 20 MEQ tablet Take 1 tablet (20 mEq total) by mouth daily. 09/02/19  Yes [name], MD  rosuvastatin (CRESTOR) 40 MG tablet TAKE 1 TABLET BY MOUTH  DAILY Patient taking differently: Take 40 mg by mouth daily.  11/19/19  Yes Perlie Mayo, NP  temazepam (RESTORIL) 15 MG capsule Take 1 capsule (15 mg total) by mouth at bedtime as needed for sleep. Patient taking differently: Take 15 mg by mouth at bedtime.  11/18/19  Yes Perlie Mayo, NP  topiramate (TOPAMAX) 25 MG tablet Take 50 mg by mouth at bedtime.  07/13/19  Yes [provider]     Vital Signs: BP (!) 117/104 (BP Location: Right Arm)   Pulse 67   Temp 97.8 F (36.6 C) (Oral)   Resp 14   Ht 5\' 4"  (1.626 m)   Wt 139 lb 12.4 oz (63.4 kg)   SpO2 98%   BMI 23.99 kg/m   Physical Exam Vitals and nursing note reviewed.  Constitutional:      Appearance: Normal appearance.  Cardiovascular:  Rate and Rhythm: Normal rate and regular rhythm.     Heart sounds: Normal heart sounds. No murmur.  Pulmonary:     Effort: Pulmonary effort is normal. No respiratory distress.     Breath sounds: Normal breath sounds. No wheezing.  Skin:    General: Skin is warm and dry.     Comments: Right groin incision soft with surrounding ecchymosis, no active bleeding or hematoma.  Neurological:     Mental Status: She is alert.     Comments: Alert, awake, and oriented x3. Speech and comprehension intact. PERRL bilaterally. No facial asymmetry. Tongue midline. Can spontaneously move all extremities. No pronator drift. Distal pulses 2+ bilaterally.  Psychiatric:         Mood and Affect: Mood normal.        Behavior: Behavior normal.     Imaging: No results found.  Labs:  CBC: Recent Labs    09/01/19 0624 11/23/19 0655 12/21/19 0549 12/22/19 0500  WBC 5.2 4.9 4.9 6.5  HGB 12.7 13.5 12.7 11.1*  HCT 37.6 41.0 38.7 33.0*  PLT 319 306 301 264    COAGS: Recent Labs    06/15/19 1442 06/15/19 1442 06/29/19 0005 09/01/19 0624 11/23/19 0655 12/21/19 0549  INR 1.0   < > 1.0 1.0 1.0 1.0  APTT 26  --  27  --  27  --    < > = values in this interval not displayed.    BMP: Recent Labs    11/23/19 0655 12/21/19 0549 12/22/19 0500 12/22/19 0845  NA 140 141 142 143  K 3.8 3.6 2.7* 2.9*  CL 108 107 113* 114*  CO2 21* 22 20* 21*  GLUCOSE 98 102* 110* 108*  BUN 17 23 13 13   CALCIUM 9.4 9.3 8.4* 8.6*  CREATININE 0.74 0.73 0.60 0.54  GFRNONAA >60 >60 >60 >60  GFRAA >60 >60 >60 >60    LIVER FUNCTION TESTS: Recent Labs    06/15/19 1442 06/15/19 1442 06/16/19 0549 06/29/19 0005 06/29/19 0946 07/13/19 1414  BILITOT 0.6   < > 0.7 0.7 0.8 0.4  AST 28   < > 22 27 23 21   ALT 21   < > 19 19 17 14   ALKPHOS 54  --  44 55 48  --   PROT 7.8   < > 6.8 7.5 7.0 7.2  ALBUMIN 4.5  --  3.8 4.5 4.1  --    < > = values in this interval not displayed.    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 Estanislado Pandy. Patient's condition stable- alert and oriented, moving all 4s, no neurological deficits noted. Heparin was stopped at 0700 today, arterial line was also removed this AM. Continue taking Plavix 75 mg once daily and Aspirin 325 mg once daily.  Received page from RN at approximately [phone] stating that patient got up to use restroom and had pain and swelling of right groin. Patient was placed back in bed, bandage was removed, hematoma was marked, and manual pressure was held by myself and Amy, RN for 20 minutes. Hematoma was pushed out. Hemostasis was achieved at  approximately 0957. Pressure dressing applied to site. Distal pulses 2+ bilaterally. STAT VAS [place] and P2Y12 ordered. Patient to lay flat for minimum of 4 hours with right leg straight.  NIR to follow.  Electronically Signed: Earley Abide, PA-C 12/22/2019, 10:06 AM   I spent a total of 35 Minutes at the the  patient's bedside AND on the patient's hospital floor or unit, greater than 50% of which was counseling/coordinating care for left ICA/PCOM aneurysm s/p embolization.

## 2019-12-22 NOTE — Discharge Summary (Signed)
Patient ID: Tanya [name] MRN: [id number] DOB/AGE: 1950/10/06 69 y.o.  Admit date: 12/21/2019 Discharge date: 12/22/2019  Supervising Physician: Luanne Bras  Patient Status: [facility] - In-pt  Admission Diagnoses:  Brain aneurysm  Discharge Diagnoses:  Active Problems:   Brain aneurysm   Discharged Condition: stable  Hospital Course:  Patient presented to [facility] 12/21/2019 for an image-guided cerebral arteriogram with embolization of left ICA/PCOM aneurysm using an Evolve Surpass flow diverter via right femoral approach by Dr. Estanislado Pandy. Procedure occurred without major complications and patient was transferred to neuro ICU in stable condition (VVS, right groin incision stable) for overnight observation. No major events occurred overnight.  At approximately 0935, received page from RN stating that patient got up to use restroom and developed pain and swelling of right groin. Patient was placed back in bed, bandage was removed, hematoma was marked, and manual pressure was held by myself and Amy, RN for 20 minutes. Hematoma was pushed out. Hemostasis was achieved at approximately 0957. Pressure dressing applied to site. Distal pulses 2+ bilaterally following this. STAT VAS [place] was ordered which revealed no evidence of pseudoaneurysm, AVF, or DVT. Patient remained flat with right leg straight for 4 hours. Following this, patient walked around room without right groin issues. Right groin incision soft. Plan to discharge home today and follow-up with Dr. Estanislado Pandy in clinic 2 weeks after discharge.   Consults: None  Significant Diagnostic Studies: MM 3D SCREEN BREAST BILATERAL  Result Date: 12/07/2019 CLINICAL DATA:  Screening. EXAM: DIGITAL SCREENING BILATERAL MAMMOGRAM WITH TOMO AND CAD COMPARISON:  Previous exam(s). ACR Breast Density Category c: The breast tissue is heterogeneously dense, which may obscure small masses. FINDINGS: There are no findings suspicious for malignancy. Images  were processed with CAD. IMPRESSION: No mammographic evidence of malignancy. A result letter of this screening mammogram will be mailed directly to the patient. RECOMMENDATION: Screening mammogram in one year. (Code:SM-B-01Y) BI-RADS CATEGORY  1: Negative. Electronically Signed   By: Margarette Canada M.D.   On: 12/07/2019 14:35   VAS [place] GROIN PSEUDOANEURYSM  Result Date: 12/22/2019  ARTERIAL PSEUDOANEURYSM  Exam: Right groin Indications: Patient complains of groin pain. History: S/P endovascular embolization via right groin. Limitations: soft tissue interface, bruising Comparison Study: No prior exam. Performing Technologist: Baldwin Crown ARDMS, RVT  Examination Guidelines: A complete evaluation includes B-mode imaging, spectral Doppler, color Doppler, and power Doppler as needed of all accessible portions of each vessel. Bilateral testing is considered an integral part of a complete examination. Limited examinations for reoccurring indications may be performed as noted. +------------+----------+---------+------+----------+ Right DuplexPSV (cm/s)Waveform PlaqueComment(s) +------------+----------+---------+------+----------+ CFA            150    triphasic                 +------------+----------+---------+------+----------+ Prox SFA        94    biphasic                  +------------+----------+---------+------+----------+ Right Vein comments:CFV and FV prox are spontaneous and phasic.  Summary: No evidence of pseudoaneurysm, AVF or DVT  Diagnosing physician: Deitra Mayo MD Electronically signed by Deitra Mayo MD on 12/22/2019 at 1:09:44 PM.   --------------------------------------------------------------------------------    Final     Treatments: Endovascular embolization of left ICA/PCOM aneurysm using an Evolve Surpass flow diverter   Discharge Exam: Blood pressure 122/72, pulse 60, temperature 98.3 F (36.8 C), temperature source Oral, resp. rate 13, height 5\' 4"   (1.626 m), weight 139 lb 12.4  oz (63.4 kg), SpO2 100 %. Physical Exam Vitals and nursing note reviewed.  Constitutional:      General: She is not in acute distress.    Appearance: Normal appearance.  Cardiovascular:     Rate and Rhythm: Normal rate and regular rhythm.     Heart sounds: Normal heart sounds. No murmur.  Pulmonary:     Effort: Pulmonary effort is normal. No respiratory distress.     Breath sounds: Normal breath sounds. No wheezing.  Skin:    General: Skin is warm and dry.     Comments: Right groin incision soft without active bleeding or hematoma.  Neurological:     Mental Status: She is alert.     Comments: Alert, awake, and oriented x3. Speech and comprehension intact. PERRL bilaterally. Can spontaneously move all extremities. No pronator drift. Fine motor and coordination intact and symmetric. Distal pulses 2+ bilaterally.  Psychiatric:        Mood and Affect: Mood normal.        Behavior: Behavior normal.     Disposition: Discharge disposition: 01-Home or Self Care       Discharge Instructions    Call MD for:  difficulty breathing, headache or visual disturbances   Complete by: As directed    Call MD for:  extreme fatigue   Complete by: As directed    Call MD for:  hives   Complete by: As directed    Call MD for:  persistant dizziness or light-headedness   Complete by: As directed    Call MD for:  persistant nausea and vomiting   Complete by: As directed    Call MD for:  redness, tenderness, or signs of infection (pain, swelling, redness, odor or green/yellow discharge around incision site)   Complete by: As directed    Call MD for:  severe uncontrolled pain   Complete by: As directed    Call MD for:  temperature >100.4   Complete by: As directed    Diet - low sodium heart healthy   Complete by: As directed    Discharge instructions   Complete by: As directed    Continue taking Plavix 75 mg once daily. Continue taking Aspirin 81 mg once  daily. No bending, stooping, or lifting more than 10 pounds for 2 weeks. No driving self for 2 weeks. Stay hydrated by drinking plenty of water.   Discharge wound care:   Complete by: As directed    Ensure right groin incision remains clean and dry. Apply pressure to right groin area when coughing/sneezing for the next 3 days.   Increase activity slowly   Complete by: As directed      Allergies as of 12/22/2019   No Known Allergies     Medication List    TAKE these medications   acetaminophen 500 MG tablet Commonly known as: TYLENOL Take 1,000 mg by mouth every 6 (six) hours as needed for moderate pain.   amLODipine 2.5 MG tablet Commonly known as: NORVASC Take 1 tablet (2.5 mg total) by mouth daily.   aspirin EC 81 MG tablet Take 81 mg by mouth at bedtime.   Biotin 5000 MCG Caps Take 5,000 mcg by mouth daily.   busPIRone 5 MG tablet Commonly known as: BUSPAR Take 5 mg by mouth 2 (two) times daily.   cloNIDine 0.2 MG tablet Commonly known as: CATAPRES TAKE 1 TABLET BY MOUTH AT  BEDTIME FOR BLOOD PRESSURE What changed:   how much to take  how to  take this  when to take this  additional instructions   clopidogrel 75 MG tablet Commonly known as: PLAVIX Take 1 tablet (75 mg total) by mouth daily.   diphenhydrAMINE 25 MG tablet Commonly known as: BENADRYL Take 50 mg by mouth daily as needed for itching.   Fish Oil 1000 MG Caps Take 1,000 mg by mouth daily.   nitroGLYCERIN 0.4 MG SL tablet Commonly known as: Nitrostat Place 1 tablet (0.4 mg total) under the tongue every 5 (five) minutes as needed. What changed:   when to take this  reasons to take this   pantoprazole 40 MG tablet Commonly known as: PROTONIX Take 1 tablet (40 mg total) by mouth daily.   potassium chloride SA 20 MEQ tablet Commonly known as: KLOR-CON Take 1 tablet (20 mEq total) by mouth daily.   rosuvastatin 40 MG tablet Commonly known as: CRESTOR TAKE 1 TABLET BY MOUTH  DAILY    temazepam 15 MG capsule Commonly known as: RESTORIL Take 1 capsule (15 mg total) by mouth at bedtime as needed for sleep. What changed: when to take this   THERATEARS OP Place 1 drop into both eyes 2 (two) times daily.   topiramate 25 MG tablet Commonly known as: TOPAMAX Take 50 mg by mouth at bedtime.   vitamin C 1000 MG tablet Take 1,000 mg by mouth daily.            Discharge Care Instructions  (From admission, onward)         Start     Ordered   12/22/19 0000  Discharge wound care:    Comments: Ensure right groin incision remains clean and dry. Apply pressure to right groin area when coughing/sneezing for the next 3 days.   12/22/19 1530         Follow-up Information    Luanne Bras, MD Follow up in 2 week(s).   Specialties: Interventional Radiology, Radiology Why: Please follow-up with Dr. Estanislado Pandy in clinic 2 weeks after discharge. Our office will call you to set up this appointment. Contact information: [place] [address] [phone]            Electronically Signed: Earley Abide, PA-C 12/22/2019, 3:42 PM   I have spent Greater Than 30 Minutes discharging [place].

## 2019-12-22 NOTE — Progress Notes (Addendum)
Pt OOB to bathrm at 0935, R groin lev 0, DP pulse +1.  Back to bed after due to new pain at R groin site, R groin lev 1 w/ hematoma extending past the gauze, no bleeding, DP pulse +1.  Began holding pressure at site at 0937. Earley Abide PA paged and at bedside at [phone].  Pressure held until 0957 and redressed. R groin Lev 0, DP pulse +1. Verbal to lay flat for 4 hours.  Ultrasound and P2y12 ordered.

## 2019-12-23 ENCOUNTER — Other Ambulatory Visit: Payer: Self-pay | Admitting: *Deleted

## 2019-12-23 DIAGNOSIS — F5101 Primary insomnia: Secondary | ICD-10-CM

## 2019-12-23 MED ORDER — TEMAZEPAM 15 MG PO CAPS: 15.0000 mg | ORAL_CAPSULE | Freq: Every evening | ORAL | 0 refills | Status: DC | PRN

## 2019-12-23 MED ORDER — POTASSIUM CHLORIDE CRYS ER 20 MEQ PO TBCR: 20.0000 meq | EXTENDED_RELEASE_TABLET | Freq: Every day | ORAL | 3 refills | Status: DC

## 2019-12-23 NOTE — Progress Notes (Deleted)
Office Visit Note  Patient: Tanya [name]             Date of Birth: [birth year]           MRN: [id number]             PCP: [name], MD Referring: [name], MD Visit Date: 12/30/2019 Occupation: @GUAROCC @  Subjective:  No chief complaint on file.   History of Present Illness: Tanya [name] is a 69 y.o. female ***   Activities of Daily Living:  Patient reports morning stiffness for *** {minute/hour:19697}.   Patient {ACTIONS;DENIES/REPORTS:21021675::"Denies"} nocturnal pain.  Difficulty dressing/grooming: {ACTIONS;DENIES/REPORTS:21021675::"Denies"} Difficulty climbing stairs: {ACTIONS;DENIES/REPORTS:21021675::"Denies"} Difficulty getting out of chair: {ACTIONS;DENIES/REPORTS:21021675::"Denies"} Difficulty using hands for taps, buttons, cutlery, and/or writing: {ACTIONS;DENIES/REPORTS:21021675::"Denies"}  No Rheumatology ROS completed.   PMFS History:  Patient Active Problem List   Diagnosis Date Noted  . Brain aneurysm 12/21/2019  . 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 Foamy urine 10/20/2019  . Sensory disturbance 06/29/2019  . Hospital discharge follow-up 06/21/2019  . Hypokalemia 06/15/2019  . Need for shingles vaccine 03/25/2019  . Educated about COVID-19 virus infection 02/15/2019  . CAD (coronary artery disease), native coronary artery 06/06/2018  . Headache 03/08/2018  . Primary osteoarthritis of both hands 02/13/2018  . Primary osteoarthritis of both knees 02/13/2018  . Anxiety 02/06/2017  . Pruritus 07/20/2016  . Arthritis 07/20/2016  . Tubular adenoma of colon 02/11/2016  . Coronary artery calcification seen on CAT scan - Moderate LAD lesion noted 11/16/2014  . Abnormal CT scan, chest 11/08/2014  . IGT (impaired glucose tolerance) 06/11/2014  . Osteopenia 01/27/2014  . Dyspnea 01/19/2014  . Microscopic hematuria 01/05/2013  . Urinary incontinence, mixed 01/05/2013  . GERD (gastroesophageal reflux disease) 12/16/2009    . Chest pain 03/01/2009  . Hyperlipemia 04/09/2008  . Essential hypertension 04/09/2008  . Insomnia disorder 04/09/2008    Past Medical History:  Diagnosis Date  . 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 Anhedonia   . Anxiety   . CAD (coronary artery disease)    a. s/p DESx2 to proximal LAD in 06/2018 b. cath in 04/2019 showing 40% mid-LAD ISR with no significant stenosis along LM, LCx, or RCA  . Cystitis   . Elevated liver enzymes   . Fatty liver   . GERD (gastroesophageal reflux disease)   . Headache    migraine  . Hyperlipidemia   . Hypertension   . Insomnia   . Unspecified hypothyroidism   . Vaginitis   . Wears glasses   . Wears hearing aid    B/L    Family History  Problem Relation Age of Onset  . Dementia Mother   . Heart disease Mother        heart attack   . Hypertension Mother   . Heart attack Father   . Heart disease Father        heart attack   . Hypertension Father    Past Surgical History:  Procedure Laterality Date  . CATARACT EXTRACTION W/PHACO Right 05/08/2019   Procedure: CATARACT EXTRACTION PHACO AND INTRAOCULAR LENS PLACEMENT (IOC);  Surgeon: Baruch Goldmann, MD;  Location: [facility];  Service: Ophthalmology;  Laterality: Right;  CDE: 10.02  . COLONOSCOPY N/A 02/08/2016   Procedure: COLONOSCOPY;  Surgeon: [name], MD;  Location: [facility];  Service: Endoscopy;  Laterality: N/A;  1200 - moved to 11:45 - Ann to notify  . CORONARY STENT INTERVENTION N/A 06/06/2018   Procedure:  CORONARY STENT INTERVENTION;  Surgeon: [name], [name], MD;  Location: [place];  Service: Cardiovascular;  Laterality: N/A;  . INTRAVASCULAR PRESSURE WIRE/FFR STUDY N/A 06/06/2018   Procedure: INTRAVASCULAR PRESSURE WIRE/FFR STUDY;  Surgeon: [name], [name], MD;  Location: [place] [facility];  Service: Cardiovascular;  Laterality: N/A;  . IR ANGIO INTRA EXTRACRAN SEL COM CAROTID INNOMINATE BILAT MOD SED  09/01/2019  . IR ANGIO VERTEBRAL SEL VERTEBRAL BILAT MOD SED   09/01/2019  . LEFT HEART CATH AND CORONARY ANGIOGRAPHY N/A 04/24/2019   Procedure: LEFT HEART CATH AND CORONARY ANGIOGRAPHY;  Surgeon: Belva Crome, MD;  Location: [place] [facility];  Service: Cardiovascular;  Laterality: N/A;  . LEFT HEART CATHETERIZATION WITH CORONARY ANGIOGRAM N/A 11/19/2014   Procedure: LEFT HEART CATHETERIZATION WITH CORONARY ANGIOGRAM;  Surgeon: [name], MD;  Location: [facility];  Service: Cardiovascular;  Laterality: N/A;  . RADIOLOGY WITH ANESTHESIA N/A 11/23/2019   Procedure: Drucilla Schmidt;  Surgeon: Luanne Bras, MD;  Location: [facility];  Service: Radiology;  Laterality: N/A;  . RADIOLOGY WITH ANESTHESIA N/A 12/21/2019   Procedure: EMBOLIZATION;  Surgeon: Luanne Bras, MD;  Location: [facility];  Service: Radiology;  Laterality: N/A;  . RIGHT/LEFT HEART CATH AND CORONARY ANGIOGRAPHY N/A 06/06/2018   Procedure: RIGHT/LEFT HEART CATH AND CORONARY ANGIOGRAPHY;  Surgeon: [name], [name], MD;  Location: [place] [facility];  Service: Cardiovascular;  Laterality: N/A;   Social History   Social History Narrative   Pt originally from the [place] been in [place] for 20 years     Immunization History  Administered Date(s) Administered  . Fluad Quad(high Dose 65+) 06/16/2019  . Influenza Whole 07/05/2008  . Influenza,inj,Quad PF,6+ Mos 07/17/2013, 08/01/2014, 05/24/2016  . Influenza-Unspecified 06/10/2017  . Pneumococcal Conjugate-13 11/08/2014  . Pneumococcal Polysaccharide-23 09/06/2004, 03/30/2016  . Td 01/21/2003  . Tdap 07/17/2013  . Zoster 01/12/2011  . Zoster Recombinat (Shingrix) 03/25/2019     Objective: Vital Signs: There were no vitals taken for this visit.   Physical Exam   Musculoskeletal Exam: ***  CDAI Exam: CDAI Score: -- Patient Global: --; Provider Global: -- Swollen: --; Tender: -- Joint Exam 12/30/2019   No joint exam has been documented for this visit   There is currently no information documented on the homunculus. Go to the  Rheumatology activity and complete the homunculus joint exam.  Investigation: No additional findings.  Imaging: MM 3D SCREEN BREAST BILATERAL  Result Date: 12/07/2019 CLINICAL DATA:  Screening. EXAM: DIGITAL SCREENING BILATERAL MAMMOGRAM WITH TOMO AND CAD COMPARISON:  Previous exam(s). ACR Breast Density Category c: The breast tissue is heterogeneously dense, which may obscure small masses. FINDINGS: There are no findings suspicious for malignancy. Images were processed with CAD. IMPRESSION: No mammographic evidence of malignancy. A result letter of this screening mammogram will be mailed directly to the patient. RECOMMENDATION: Screening mammogram in one year. (Code:SM-B-01Y) BI-RADS CATEGORY  1: Negative. Electronically Signed   By: Margarette Canada M.D.   On: 12/07/2019 14:35   VAS [place] GROIN PSEUDOANEURYSM  Result Date: 12/22/2019  ARTERIAL PSEUDOANEURYSM  Exam: Right groin Indications: Patient complains of groin pain. History: S/P endovascular embolization via right groin. Limitations: soft tissue interface, bruising Comparison Study: No prior exam. Performing Technologist: Baldwin Crown ARDMS, RVT  Examination Guidelines: A complete evaluation includes B-mode imaging, spectral Doppler, color Doppler, and power Doppler as needed of all accessible portions of each vessel. Bilateral testing is considered an integral part of a complete examination. Limited examinations for reoccurring  indications may be performed as noted. +------------+----------+---------+------+----------+ Right DuplexPSV (cm/s)Waveform PlaqueComment(s) +------------+----------+---------+------+----------+ CFA            150    triphasic                 +------------+----------+---------+------+----------+ Prox SFA        94    biphasic                  +------------+----------+---------+------+----------+ Right Vein comments:CFV and FV prox are spontaneous and phasic.  Summary: No evidence of pseudoaneurysm, AVF or DVT   Diagnosing physician: Deitra Mayo MD Electronically signed by Deitra Mayo MD on 12/22/2019 at 1:09:44 PM.   --------------------------------------------------------------------------------    Final     Recent Labs: Lab Results  Component Value Date   WBC 6.5 12/22/2019   HGB 11.1 (L) 12/22/2019   PLT 264 12/22/2019   NA 143 12/22/2019   K 2.9 (L) 12/22/2019   CL 114 (H) 12/22/2019   CO2 21 (L) 12/22/2019   GLUCOSE 108 (H) 12/22/2019   BUN 13 12/22/2019   CREATININE 0.54 12/22/2019   BILITOT 0.4 07/13/2019   ALKPHOS 48 06/29/2019   AST 21 07/13/2019   ALT 14 07/13/2019   PROT 7.2 07/13/2019   ALBUMIN 4.1 06/29/2019   CALCIUM 8.6 (L) 12/22/2019   GFRAA >60 12/22/2019    Speciality Comments: No specialty comments available.  Procedures:  No procedures performed Allergies: Patient has no known allergies.   Assessment / Plan:     Visit Diagnoses: No diagnosis found.  Orders: No orders of the defined types were placed in this encounter.  No orders of the defined types were placed in this encounter.   Face-to-face time spent with patient was *** minutes. Greater than 50% of time was spent in counseling and coordination of care.  Follow-Up Instructions: No follow-ups on file.   Earnestine Mealing, CMA  Note - This record has been created using Editor, commissioning.  Chart creation errors have been sought, but may not always  have been located. Such creation errors do not reflect on  the standard of medical care.

## 2019-12-28 ENCOUNTER — Telehealth (HOSPITAL_COMMUNITY): Payer: Self-pay

## 2019-12-28 NOTE — Telephone Encounter (Signed)
Called to schedule 2 wk f/u, no answer, vm full. AW

## 2019-12-30 ENCOUNTER — Ambulatory Visit: Payer: Medicare Other | Admitting: Rheumatology

## 2019-12-31 ENCOUNTER — Telehealth (HOSPITAL_COMMUNITY): Payer: Self-pay

## 2019-12-31 NOTE — Telephone Encounter (Signed)
Called to schedule 2 wk f/u, no answer, vm full. AW

## 2020-01-06 ENCOUNTER — Telehealth (HOSPITAL_COMMUNITY): Payer: Self-pay

## 2020-01-06 NOTE — Telephone Encounter (Signed)
Called to schedule 2 wk f/u, no answer, no vm. AW  

## 2020-01-08 NOTE — Progress Notes (Signed)
Office Visit Note  Patient: Tanya [name]             Date of Birth: 07/07/1951           MRN: [id number]             PCP: [name], MD Referring: [name], MD Visit Date: 01/18/2020 Occupation: @GUAROCC @  Subjective:  Pain in multiple joints   History of Present Illness: Tanya [name] is a 69 y.o. female with history of osteoarthritis.  She returns today after 2 years.  She states she continues to have a lot of pain and discomfort in her hands and feet.  She states she has some discomfort in her right knee joint where she had surgery in the past.  She denies any joint swelling.  She states she has seen Dr. Nevada Crane for the rash and was advised to use topical agents which she has been using.  She also takes Benadryl sometimes for itching.  Activities of Daily Living:  Patient reports morning stiffness for 24 hours.   Patient Reports nocturnal pain.  Difficulty dressing/grooming: Denies Difficulty climbing stairs: Reports Difficulty getting out of chair: Denies Difficulty using hands for taps, buttons, cutlery, and/or writing: Reports  Review of Systems  Constitutional: Positive for fatigue. Negative for night sweats, weight gain and weight loss.  HENT: Positive for mouth dryness. Negative for mouth sores, trouble swallowing, trouble swallowing and nose dryness.   Eyes: Positive for dryness. Negative for pain, redness and visual disturbance.  Respiratory: Positive for shortness of breath. Negative for cough and difficulty breathing.   Cardiovascular: Positive for chest pain and palpitations. Negative for hypertension, irregular heartbeat and swelling in legs/feet.       Currently wearing heart monitor  Gastrointestinal: Positive for constipation. Negative for blood in stool and diarrhea.  Endocrine: Negative for heat intolerance and increased urination.  Genitourinary: Negative for difficulty urinating, painful urination and vaginal dryness.  Musculoskeletal:  Positive for arthralgias, joint pain, myalgias, morning stiffness, muscle tenderness and myalgias. Negative for joint swelling and muscle weakness.  Skin: Positive for rash. Negative for color change, hair loss, skin tightness, ulcers and sensitivity to sunlight.  Allergic/Immunologic: Negative for susceptible to infections.  Neurological: Positive for memory loss. Negative for dizziness, numbness, headaches, night sweats and weakness.  Hematological: Negative for bruising/bleeding tendency and swollen glands.  Psychiatric/Behavioral: Negative for depressed mood, confusion and sleep disturbance. The patient is not nervous/anxious.     PMFS History:  Patient Active Problem List   Diagnosis Date Noted  . Brain aneurysm 12/21/2019  . 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 Foamy urine 10/20/2019  . Sensory disturbance 06/29/2019  . Hospital discharge follow-up 06/21/2019  . Hypokalemia 06/15/2019  . Need for shingles vaccine 03/25/2019  . Educated about COVID-19 virus infection 02/15/2019  . CAD (coronary artery disease), native coronary artery 06/06/2018  . Headache 03/08/2018  . Primary osteoarthritis of both hands 02/13/2018  . Primary osteoarthritis of both knees 02/13/2018  . Anxiety 02/06/2017  . Pruritus 07/20/2016  . Arthritis 07/20/2016  . Tubular adenoma of colon 02/11/2016  . Coronary artery calcification seen on CAT scan - Moderate LAD lesion noted 11/16/2014  . Abnormal CT scan, chest 11/08/2014  . IGT (impaired glucose tolerance) 06/11/2014  . Osteopenia 01/27/2014  . Dyspnea 01/19/2014  . Microscopic hematuria 01/05/2013  . Urinary incontinence, mixed 01/05/2013  . GERD (gastroesophageal reflux disease) 12/16/2009  . Chest pain 03/01/2009  . Hyperlipemia 04/09/2008  . Essential hypertension  04/09/2008  . Insomnia disorder 04/09/2008    Past Medical History:  Diagnosis Date  . Aneurysm ([place])    left ICA 09/2019  . Anhedonia   . Anxiety   . CAD  (coronary artery disease)    a. s/p DESx2 to proximal LAD in 06/2018 b. cath in 04/2019 showing 40% mid-LAD ISR with no significant stenosis along LM, LCx, or RCA  . Cystitis   . Elevated liver enzymes   . Fatty liver   . GERD (gastroesophageal reflux disease)   . Headache    migraine  . Hyperlipidemia   . Hypertension   . Insomnia   . Unspecified hypothyroidism   . Vaginitis   . Wears glasses   . Wears hearing aid    B/L    Family History  Problem Relation Age of Onset  . Dementia Mother   . Heart disease Mother        heart attack   . Hypertension Mother   . Heart attack Father   . Heart disease Father        heart attack   . Hypertension Father    Past Surgical History:  Procedure Laterality Date  . CATARACT EXTRACTION W/PHACO Right 05/08/2019   Procedure: CATARACT EXTRACTION PHACO AND INTRAOCULAR LENS PLACEMENT (IOC);  Surgeon: Baruch Goldmann, MD;  Location: [facility];  Service: Ophthalmology;  Laterality: Right;  CDE: 10.02  . COLONOSCOPY N/A 02/08/2016   Procedure: COLONOSCOPY;  Surgeon: [name], MD;  Location: [facility];  Service: Endoscopy;  Laterality: N/A;  1200 - moved to 11:45 - Ann to notify  . CORONARY STENT INTERVENTION N/A 06/06/2018   Procedure: CORONARY STENT INTERVENTION;  Surgeon: [name], [name], MD;  Location: [place] [facility];  Service: Cardiovascular;  Laterality: N/A;  . INTRAVASCULAR PRESSURE WIRE/FFR STUDY N/A 06/06/2018   Procedure: INTRAVASCULAR PRESSURE WIRE/FFR STUDY;  Surgeon: [name], [name], MD;  Location: [place] [facility];  Service: Cardiovascular;  Laterality: N/A;  . IR ANGIO INTRA EXTRACRAN SEL COM CAROTID INNOMINATE BILAT MOD SED  09/01/2019  . IR ANGIO INTRA EXTRACRAN SEL INTERNAL CAROTID UNI L MOD SED  12/21/2019  . IR ANGIO VERTEBRAL SEL VERTEBRAL BILAT MOD SED  09/01/2019  . IR ANGIOGRAM FOLLOW UP STUDY  12/21/2019  . IR TRANSCATH/EMBOLIZ  12/21/2019  . LEFT HEART CATH AND CORONARY ANGIOGRAPHY N/A 04/24/2019   Procedure: LEFT  HEART CATH AND CORONARY ANGIOGRAPHY;  Surgeon: Belva Crome, MD;  Location: [place] [facility];  Service: Cardiovascular;  Laterality: N/A;  . LEFT HEART CATHETERIZATION WITH CORONARY ANGIOGRAM N/A 11/19/2014   Procedure: LEFT HEART CATHETERIZATION WITH CORONARY ANGIOGRAM;  Surgeon: [name], MD;  Location: [facility] CATH LAB;  Service: Cardiovascular;  Laterality: N/A;  . RADIOLOGY WITH ANESTHESIA N/A 11/23/2019   Procedure: Drucilla Schmidt;  Surgeon: Luanne Bras, MD;  Location: [place];  Service: Radiology;  Laterality: N/A;  . RADIOLOGY WITH ANESTHESIA N/A 12/21/2019   Procedure: EMBOLIZATION;  Surgeon: Luanne Bras, MD;  Location: [place];  Service: Radiology;  Laterality: N/A;  . RIGHT/LEFT HEART CATH AND CORONARY ANGIOGRAPHY N/A 06/06/2018   Procedure: RIGHT/LEFT HEART CATH AND CORONARY ANGIOGRAPHY;  Surgeon: [name], [name], MD;  Location: [place] [facility];  Service: Cardiovascular;  Laterality: N/A;   Social History   Social History Narrative   Pt originally from the [place] been in [place] for 20 years     Immunization History  Administered Date(s) Administered  . Fluad Quad(high Dose 65+) 06/16/2019  .  Influenza Whole 07/05/2008  . Influenza,inj,Quad PF,6+ Mos 07/17/2013, 08/01/2014, 05/24/2016  . Influenza-Unspecified 06/10/2017  . Pneumococcal Conjugate-13 11/08/2014  . Pneumococcal Polysaccharide-23 09/06/2004, 03/30/2016  . Td 01/21/2003  . Tdap 07/17/2013  . Zoster 01/12/2011  . Zoster Recombinat (Shingrix) 03/25/2019     Objective: Vital Signs: BP 132/67 (BP Location: Right Arm, Patient Position: Sitting, Cuff Size: Normal)   Pulse (!) 56   Resp 12   Ht 5\' 4"  (1.626 m)   Wt 139 lb 3.2 oz (63.1 kg)   BMI 23.89 kg/m    Physical Exam Vitals and nursing note reviewed.  Constitutional:      Appearance: She is well-developed.  HENT:     Head: Normocephalic and atraumatic.  Eyes:     Conjunctiva/sclera: Conjunctivae normal.  Cardiovascular:     Rate and  Rhythm: Normal rate and regular rhythm.     Heart sounds: Normal heart sounds.  Pulmonary:     Effort: Pulmonary effort is normal.     Breath sounds: Normal breath sounds.  Abdominal:     General: Bowel sounds are normal.     Palpations: Abdomen is soft.  Musculoskeletal:     Cervical back: Normal range of motion.  Lymphadenopathy:     Cervical: No cervical adenopathy.  Skin:    General: Skin is warm and dry.     Capillary Refill: Capillary refill takes less than 2 seconds.  Neurological:     Mental Status: She is alert and oriented to person, place, and time.  Psychiatric:        Behavior: Behavior normal.      Musculoskeletal Exam: C-spine was in good range of motion.  Shoulder joints and elbow joints in good range of motion.  She had bilateral PIP and DIP thickening with subluxation of several joints.  Hip joints and knee joints in good range of motion.  She has some warmth on palpation of her right knee joint.  She has some bilateral first MTP and DIP and PIP thickening consistent with osteoarthritis.  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  CDAI Exam: CDAI Score: -- Patient Global: --; Provider Global: -- Swollen: --; Tender: -- Joint Exam 01/18/2020   No joint exam has been documented for this visit   There is currently no information documented on the homunculus. Go to the Rheumatology activity and complete the homunculus joint exam.  Investigation: No additional findings.  Imaging: IR Transcath/Emboliz  Result Date: 12/24/2019 CLINICAL DATA:  Patient with a history of headaches. 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 EXAM: TRANSCATHETER THERAPY EMBOLIZATION COMPARISON:  Diagnostic catheter arteriogram of September 01, 2019. MEDICATIONS: Heparin 3,000 units IV; Ancef 2 g IV antibiotic was administered within 1 hour of the procedure. ANESTHESIA/SEDATION: General anesthesia.  CONTRAST:  Omnipaque 300 approximately 60 mL. FLUOROSCOPY TIME:  Fluoroscopy Time: 36 minutes 0 seconds 1199 mGy). COMPLICATIONS: None immediate. TECHNIQUE: Informed written consent was obtained from the patient after a thorough discussion of the procedural risks, benefits and alternatives. All questions were addressed. Maximal Sterile Barrier Technique was utilized including caps, mask, sterile gowns, sterile gloves, sterile drape, hand hygiene and skin antiseptic. A timeout was performed prior to the initiation of the procedure. The right groin was prepped and draped in the usual sterile fashion. Thereafter using modified Seldinger technique, transfemoral access into the right common femoral artery was obtained without difficulty. Over a 0.035 inch guidewire, a 5 French Pinnacle sheath was inserted.  Through this, and also over 0.035 inch guidewire, a 5 [place] JB 1 catheter was advanced to the aortic arch region and selectively positioned in the left common carotid artery. FINDINGS: The left common carotid arteriogram demonstrates the left external carotid artery and its major branches to be widely patent. The right internal carotid artery at the bulb to the cranial skull base demonstrates wide patency. The petrous, cavernous and the supraclinoid segments are also widely patent. Again seen arising in the left posterior communicating artery region is a lobulated wide neck saccular aneurysm projecting inferiorly and laterally. The left middle and the left anterior cerebral artery opacify into the capillary and venous phases. Transient cross-filling via the anterior communicating artery of the right anterior A2 segment is seen. ENDOVASCULAR TREATMENT OF LEFT INTERNAL CAROTID ARTERY WIDE NECK POSTERIOR COMMUNICATING ARTERY REGION ANEURYSM. The diagnostic JB 1 catheter in the left common carotid artery was then exchanged over a 0.035 inch 300 cm Rosen exchange guidewire for an 8 French 80 cm Neuron Max sheath using  biplane roadmap technique and constant fluoroscopic guidance. Guidewire was removed. Good aspiration obtained from the hub of the Neuron Max sheath. A gentle control arteriogram performed through the sheath demonstrated no evidence of spasms, dissections or of intraluminal filling defects. Over a 0.035 inch Roadrunner guidewire, a 115 cm Catalyst guide catheter was then advanced to the horizontal cervical segment of the left internal carotid artery. The guidewire was removed. Again good aspiration was obtained from the hub of the 5 [place] Catalyst guide catheter. A control arteriogram performed through this demonstrated no change in the intracranial circulation. At this time, the Neuron Max sheath was advanced to the distal 1/3 of the left internal carotid artery. Over a 0.014 inch standard Synchro micro guidewire with a J configuration to avoid dissections or inducing spasm, an 027 Phenom microcatheter was advanced distal to the tip of the Catalyst guide catheter. Using a torque device, the micro guidewire was then advanced to the M2 M3 region of the dominant inferior division followed by the microcatheter. The guidewire was removed. Good aspiration was obtained from the hub of the Phenom microcatheter. A gentle control arteriogram performed through the microcatheter demonstrates safe position of the tip of the microcatheter which was connected to continuous heparinized saline infusion. Following assessment of the measurements of the proximal and the distal landing zones in conjunction with the diameter proximally and distally, it was elected to use a 4 mm x 12 mm Surpass Evolve device. This device was prepped and purged in its microcatheter with heparinized saline infusion. Thereafter, using continuous heparinized saline infusion, the device was advanced to the distal end of the microcatheter. The O ring on the delivery apparatus was then loosened to straighten the system. With slight forward gentle traction with  the right hand on the delivery micro guidewire, with the left hand the delivery microcatheter was retrieved unsheathing the distal wire and also a few mm of the distal device. Once fully opened, the combination was retrieved to the distal landing zone which was just proximal to the left internal carotid terminus. Once there, with slight forward traction with hand on the delivery guidewire, the device was unsheathed. System was then locked and the delivery apparatus was medialized towards the inner curved of the native vessel. Thereafter again with slight forward gentle traction with the right hand on the delivery micro guidewire, the remainder of the device was then unsheathed. A control arteriogram performed through the 5 [place] Catalyst guide catheter in the  left internal carotid artery demonstrated excellent apposition with optimal proximal and distal landing zones. Mild stagnation of flow was already evident in the aneurysm. There was slight suggestible apposition noted along the proximal landing zone medially. This was felt to be secondary to the transition of the left internal carotid artery and caliber. The microcatheter was then gently advanced without difficulty and the micro guidewire captured. The combination was retrieved and removed. Control arteriograms were then performed 15 and 30 minutes post deployment of the device. These continued to demonstrate excellent apposition of flow without evidence of intra stent irregularity or of distal filling defects or of occlusions involving the left middle cerebral artery and the left anterior cerebral artery distributions. To mitigate the chance of that happening, the patient was given a total of 4.5 mg of Integrilin intra-arterially in divided doses. Throughout the procedure, the patient's blood pressure and neurological status remained stable. The ACT was in the range of approximately 250-270. This was partially reversed with approximately 12.5 mg of protamine  sulfate IV. A final control arteriogram performed through the Neuron Max sheath in the left common carotid artery continued to demonstrate excellent flow through the extracranial and intracranial internal carotid artery, and the anterior circulation. The Neuron Max sheath was then retrieved into the abdominal aorta and exchanged over a J-wire for an 8 French Pinnacle sheath which in turn was then removed within the application of an 8 [place] Angio-Seal closure device. The right groin appeared soft. Distal pulses remained palpable in the dorsalis pedis, and the posterior tibial regions bilaterally unchanged. The patient's general anesthesia was then reversed. The patient was extubated without event. Upon recovery, the patient denied any headaches, nausea, vomiting or motor or visual symptoms. She was then transferred to the neuro PACU and then neuro ICU to continue on vigilant neural checks, control of her blood pressure, and continue on with low-dose IV heparin. Overnight the patient experienced slight bleeding in the right groin which was controlled with compression manually and with the patient lying still with right leg immobile. The following morning, the right groin appeared soft without evidence of a hematoma. However, because of the bleeding and bruising, an ultrasound of the common femoral artery was obtained. This demonstrated no evidence of a hematoma, or of pseudo aneurysm. The patient was then gradually immobilized after another 4 hours. Patient was able tolerate p.o. intake without difficulty. Neurologically she remained stable. She was then discharged home under the care of her husband with special instructions to maintain bed rest for another 24 hours. She was advised to refrain from stooping, bending or lifting weights above 10 pounds for 2 weeks. She was also advised to refrain from driving for 2 weeks. Patient was asked to maintain adequate hydration by drinking water and to continue with aspirin  325 mg a day, and Plavix 75 mg a day for at least 2 weeks. 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 PLAN: Follow-up in the clinic in 2 weeks time. Electronically Signed   By: Luanne Bras M.D.   On: 12/23/2019 10:51   IR Angiogram Follow Up Study  Result Date: 12/24/2019 CLINICAL DATA:  Patient with a history of headaches. 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 EXAM: TRANSCATHETER THERAPY EMBOLIZATION COMPARISON:  Diagnostic catheter arteriogram of September 01, 2019. MEDICATIONS: Heparin 3,000 units IV; Ancef 2 g IV antibiotic was administered within  1 hour of the procedure. ANESTHESIA/SEDATION: General anesthesia. CONTRAST:  Omnipaque 300 approximately 60 mL. FLUOROSCOPY TIME:  Fluoroscopy Time: 36 minutes 0 seconds 1199 mGy). COMPLICATIONS: None immediate. TECHNIQUE: Informed written consent was obtained from the patient after a thorough discussion of the procedural risks, benefits and alternatives. All questions were addressed. Maximal Sterile Barrier Technique was utilized including caps, mask, sterile gowns, sterile gloves, sterile drape, hand hygiene and skin antiseptic. A timeout was performed prior to the initiation of the procedure. The right groin was prepped and draped in the usual sterile fashion. Thereafter using modified Seldinger technique, transfemoral access into the right common femoral artery was obtained without difficulty. Over a 0.035 inch guidewire, a 5 French Pinnacle sheath was inserted. Through this, and also over 0.035 inch guidewire, a 5 [place] JB 1 catheter was advanced to the aortic arch region and selectively positioned in the left common carotid artery. FINDINGS: The left common carotid arteriogram demonstrates the left external carotid artery and its major branches to be widely  patent. The right internal carotid artery at the bulb to the cranial skull base demonstrates wide patency. The petrous, cavernous and the supraclinoid segments are also widely patent. Again seen arising in the left posterior communicating artery region is a lobulated wide neck saccular aneurysm projecting inferiorly and laterally. The left middle and the left anterior cerebral artery opacify into the capillary and venous phases. Transient cross-filling via the anterior communicating artery of the right anterior A2 segment is seen. ENDOVASCULAR TREATMENT OF LEFT INTERNAL CAROTID ARTERY WIDE NECK POSTERIOR COMMUNICATING ARTERY REGION ANEURYSM. The diagnostic JB 1 catheter in the left common carotid artery was then exchanged over a 0.035 inch 300 cm Rosen exchange guidewire for an 8 French 80 cm Neuron Max sheath using biplane roadmap technique and constant fluoroscopic guidance. Guidewire was removed. Good aspiration obtained from the hub of the Neuron Max sheath. A gentle control arteriogram performed through the sheath demonstrated no evidence of spasms, dissections or of intraluminal filling defects. Over a 0.035 inch Roadrunner guidewire, a 115 cm Catalyst guide catheter was then advanced to the horizontal cervical segment of the left internal carotid artery. The guidewire was removed. Again good aspiration was obtained from the hub of the 5 [place] Catalyst guide catheter. A control arteriogram performed through this demonstrated no change in the intracranial circulation. At this time, the Neuron Max sheath was advanced to the distal 1/3 of the left internal carotid artery. Over a 0.014 inch standard Synchro micro guidewire with a J configuration to avoid dissections or inducing spasm, an 027 Phenom microcatheter was advanced distal to the tip of the Catalyst guide catheter. Using a torque device, the micro guidewire was then advanced to the M2 M3 region of the dominant inferior division followed by the  microcatheter. The guidewire was removed. Good aspiration was obtained from the hub of the Phenom microcatheter. A gentle control arteriogram performed through the microcatheter demonstrates safe position of the tip of the microcatheter which was connected to continuous heparinized saline infusion. Following assessment of the measurements of the proximal and the distal landing zones in conjunction with the diameter proximally and distally, it was elected to use a 4 mm x 12 mm Surpass Evolve device. This device was prepped and purged in its microcatheter with heparinized saline infusion. Thereafter, using continuous heparinized saline infusion, the device was advanced to the distal end of the microcatheter. The O ring on the delivery apparatus was then loosened to straighten the system. With slight forward gentle  traction with the right hand on the delivery micro guidewire, with the left hand the delivery microcatheter was retrieved unsheathing the distal wire and also a few mm of the distal device. Once fully opened, the combination was retrieved to the distal landing zone which was just proximal to the left internal carotid terminus. Once there, with slight forward traction with hand on the delivery guidewire, the device was unsheathed. System was then locked and the delivery apparatus was medialized towards the inner curved of the native vessel. Thereafter again with slight forward gentle traction with the right hand on the delivery micro guidewire, the remainder of the device was then unsheathed. A control arteriogram performed through the 5 [place] Catalyst guide catheter in the left internal carotid artery demonstrated excellent apposition with optimal proximal and distal landing zones. Mild stagnation of flow was already evident in the aneurysm. There was slight suggestible apposition noted along the proximal landing zone medially. This was felt to be secondary to the transition of the left internal carotid  artery and caliber. The microcatheter was then gently advanced without difficulty and the micro guidewire captured. The combination was retrieved and removed. Control arteriograms were then performed 15 and 30 minutes post deployment of the device. These continued to demonstrate excellent apposition of flow without evidence of intra stent irregularity or of distal filling defects or of occlusions involving the left middle cerebral artery and the left anterior cerebral artery distributions. To mitigate the chance of that happening, the patient was given a total of 4.5 mg of Integrilin intra-arterially in divided doses. Throughout the procedure, the patient's blood pressure and neurological status remained stable. The ACT was in the range of approximately 250-270. This was partially reversed with approximately 12.5 mg of protamine sulfate IV. A final control arteriogram performed through the Neuron Max sheath in the left common carotid artery continued to demonstrate excellent flow through the extracranial and intracranial internal carotid artery, and the anterior circulation. The Neuron Max sheath was then retrieved into the abdominal aorta and exchanged over a J-wire for an 8 French Pinnacle sheath which in turn was then removed within the application of an 8 [place] Angio-Seal closure device. The right groin appeared soft. Distal pulses remained palpable in the dorsalis pedis, and the posterior tibial regions bilaterally unchanged. The patient's general anesthesia was then reversed. The patient was extubated without event. Upon recovery, the patient denied any headaches, nausea, vomiting or motor or visual symptoms. She was then transferred to the neuro PACU and then neuro ICU to continue on vigilant neural checks, control of her blood pressure, and continue on with low-dose IV heparin. Overnight the patient experienced slight bleeding in the right groin which was controlled with compression manually and with the  patient lying still with right leg immobile. The following morning, the right groin appeared soft without evidence of a hematoma. However, because of the bleeding and bruising, an ultrasound of the common femoral artery was obtained. This demonstrated no evidence of a hematoma, or of pseudo aneurysm. The patient was then gradually immobilized after another 4 hours. Patient was able tolerate p.o. intake without difficulty. Neurologically she remained stable. She was then discharged home under the care of her husband with special instructions to maintain bed rest for another 24 hours. She was advised to refrain from stooping, bending or lifting weights above 10 pounds for 2 weeks. She was also advised to refrain from driving for 2 weeks. Patient was asked to maintain adequate hydration by drinking water and  to continue with aspirin 325 mg a day, and Plavix 75 mg a day for at least 2 weeks. 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 PLAN: Follow-up in the clinic in 2 weeks time. Electronically Signed   By: Luanne Bras M.D.   On: 12/23/2019 10:51   VAS [place] GROIN PSEUDOANEURYSM  Result Date: 12/22/2019  ARTERIAL PSEUDOANEURYSM  Exam: Right groin Indications: Patient complains of groin pain. History: S/P endovascular embolization via right groin. Limitations: soft tissue interface, bruising Comparison Study: No prior exam. Performing Technologist: Baldwin Crown ARDMS, RVT  Examination Guidelines: A complete evaluation includes B-mode imaging, spectral Doppler, color Doppler, and power Doppler as needed of all accessible portions of each vessel. Bilateral testing is considered an integral part of a complete examination. Limited examinations for reoccurring indications may be performed as noted. +------------+----------+---------+------+----------+ Right DuplexPSV (cm/s)Waveform PlaqueComment(s)  +------------+----------+---------+------+----------+ CFA            150    triphasic                 +------------+----------+---------+------+----------+ Prox SFA        94    biphasic                  +------------+----------+---------+------+----------+ Right Vein comments:CFV and FV prox are spontaneous and phasic.  Summary: No evidence of pseudoaneurysm, AVF or DVT  Diagnosing physician: Deitra Mayo MD Electronically signed by Deitra Mayo MD on 12/22/2019 at 1:09:44 PM.   --------------------------------------------------------------------------------    Final    IR ANGIO INTRA EXTRACRAN SEL INTERNAL CAROTID UNI L MOD SED  Result Date: 12/24/2019 CLINICAL DATA:  Patient with a history of headaches. 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 EXAM: TRANSCATHETER THERAPY EMBOLIZATION COMPARISON:  Diagnostic catheter arteriogram of September 01, 2019. MEDICATIONS: Heparin 3,000 units IV; Ancef 2 g IV antibiotic was administered within 1 hour of the procedure. ANESTHESIA/SEDATION: General anesthesia. CONTRAST:  Omnipaque 300 approximately 60 mL. FLUOROSCOPY TIME:  Fluoroscopy Time: 36 minutes 0 seconds 1199 mGy). COMPLICATIONS: None immediate. TECHNIQUE: Informed written consent was obtained from the patient after a thorough discussion of the procedural risks, benefits and alternatives. All questions were addressed. Maximal Sterile Barrier Technique was utilized including caps, mask, sterile gowns, sterile gloves, sterile drape, hand hygiene and skin antiseptic. A timeout was performed prior to the initiation of the procedure. The right groin was prepped and draped in the usual sterile fashion. Thereafter using modified Seldinger technique, transfemoral access into the right common femoral artery was obtained without difficulty. Over a 0.035 inch guidewire, a 5 French Pinnacle sheath was inserted.  Through this, and also over 0.035 inch guidewire, a 5 [place] JB 1 catheter was advanced to the aortic arch region and selectively positioned in the left common carotid artery. FINDINGS: The left common carotid arteriogram demonstrates the left external carotid artery and its major branches to be widely patent. The right internal carotid artery at the bulb to the cranial skull base demonstrates wide patency. The petrous, cavernous and the supraclinoid segments are also widely patent. Again seen arising in the left posterior communicating artery region is a lobulated wide neck saccular aneurysm projecting inferiorly and laterally. The left middle and the left anterior cerebral artery opacify into the capillary and venous phases. Transient cross-filling via the anterior communicating artery of the right anterior A2 segment is seen. ENDOVASCULAR TREATMENT OF LEFT INTERNAL  CAROTID ARTERY WIDE NECK POSTERIOR COMMUNICATING ARTERY REGION ANEURYSM. The diagnostic JB 1 catheter in the left common carotid artery was then exchanged over a 0.035 inch 300 cm Rosen exchange guidewire for an 8 French 80 cm Neuron Max sheath using biplane roadmap technique and constant fluoroscopic guidance. Guidewire was removed. Good aspiration obtained from the hub of the Neuron Max sheath. A gentle control arteriogram performed through the sheath demonstrated no evidence of spasms, dissections or of intraluminal filling defects. Over a 0.035 inch Roadrunner guidewire, a 115 cm Catalyst guide catheter was then advanced to the horizontal cervical segment of the left internal carotid artery. The guidewire was removed. Again good aspiration was obtained from the hub of the 5 [place] Catalyst guide catheter. A control arteriogram performed through this demonstrated no change in the intracranial circulation. At this time, the Neuron Max sheath was advanced to the distal 1/3 of the left internal carotid artery. Over a 0.014 inch standard Synchro micro  guidewire with a J configuration to avoid dissections or inducing spasm, an 027 Phenom microcatheter was advanced distal to the tip of the Catalyst guide catheter. Using a torque device, the micro guidewire was then advanced to the M2 M3 region of the dominant inferior division followed by the microcatheter. The guidewire was removed. Good aspiration was obtained from the hub of the Phenom microcatheter. A gentle control arteriogram performed through the microcatheter demonstrates safe position of the tip of the microcatheter which was connected to continuous heparinized saline infusion. Following assessment of the measurements of the proximal and the distal landing zones in conjunction with the diameter proximally and distally, it was elected to use a 4 mm x 12 mm Surpass Evolve device. This device was prepped and purged in its microcatheter with heparinized saline infusion. Thereafter, using continuous heparinized saline infusion, the device was advanced to the distal end of the microcatheter. The O ring on the delivery apparatus was then loosened to straighten the system. With slight forward gentle traction with the right hand on the delivery micro guidewire, with the left hand the delivery microcatheter was retrieved unsheathing the distal wire and also a few mm of the distal device. Once fully opened, the combination was retrieved to the distal landing zone which was just proximal to the left internal carotid terminus. Once there, with slight forward traction with hand on the delivery guidewire, the device was unsheathed. System was then locked and the delivery apparatus was medialized towards the inner curved of the native vessel. Thereafter again with slight forward gentle traction with the right hand on the delivery micro guidewire, the remainder of the device was then unsheathed. A control arteriogram performed through the 5 [place] Catalyst guide catheter in the left internal carotid artery demonstrated  excellent apposition with optimal proximal and distal landing zones. Mild stagnation of flow was already evident in the aneurysm. There was slight suggestible apposition noted along the proximal landing zone medially. This was felt to be secondary to the transition of the left internal carotid artery and caliber. The microcatheter was then gently advanced without difficulty and the micro guidewire captured. The combination was retrieved and removed. Control arteriograms were then performed 15 and 30 minutes post deployment of the device. These continued to demonstrate excellent apposition of flow without evidence of intra stent irregularity or of distal filling defects or of occlusions involving the left middle cerebral artery and the left anterior cerebral artery distributions. To mitigate the chance of that happening, the patient was given a total  of 4.5 mg of Integrilin intra-arterially in divided doses. Throughout the procedure, the patient's blood pressure and neurological status remained stable. The ACT was in the range of approximately 250-270. This was partially reversed with approximately 12.5 mg of protamine sulfate IV. A final control arteriogram performed through the Neuron Max sheath in the left common carotid artery continued to demonstrate excellent flow through the extracranial and intracranial internal carotid artery, and the anterior circulation. The Neuron Max sheath was then retrieved into the abdominal aorta and exchanged over a J-wire for an 8 French Pinnacle sheath which in turn was then removed within the application of an 8 [place] Angio-Seal closure device. The right groin appeared soft. Distal pulses remained palpable in the dorsalis pedis, and the posterior tibial regions bilaterally unchanged. The patient's general anesthesia was then reversed. The patient was extubated without event. Upon recovery, the patient denied any headaches, nausea, vomiting or motor or visual symptoms. She was  then transferred to the neuro PACU and then neuro ICU to continue on vigilant neural checks, control of her blood pressure, and continue on with low-dose IV heparin. Overnight the patient experienced slight bleeding in the right groin which was controlled with compression manually and with the patient lying still with right leg immobile. The following morning, the right groin appeared soft without evidence of a hematoma. However, because of the bleeding and bruising, an ultrasound of the common femoral artery was obtained. This demonstrated no evidence of a hematoma, or of pseudo aneurysm. The patient was then gradually immobilized after another 4 hours. Patient was able tolerate p.o. intake without difficulty. Neurologically she remained stable. She was then discharged home under the care of her husband with special instructions to maintain bed rest for another 24 hours. She was advised to refrain from stooping, bending or lifting weights above 10 pounds for 2 weeks. She was also advised to refrain from driving for 2 weeks. Patient was asked to maintain adequate hydration by drinking water and to continue with aspirin 325 mg a day, and Plavix 75 mg a day for at least 2 weeks. 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 PLAN: Follow-up in the clinic in 2 weeks time. Electronically Signed   By: Luanne Bras M.D.   On: 12/23/2019 10:51    Recent Labs: Lab Results  Component Value Date   WBC 6.5 12/22/2019   HGB 11.1 (L) 12/22/2019   PLT 264 12/22/2019   NA 143 12/22/2019   K 2.9 (L) 12/22/2019   CL 114 (H) 12/22/2019   CO2 21 (L) 12/22/2019   GLUCOSE 108 (H) 12/22/2019   BUN 13 12/22/2019   CREATININE 0.54 12/22/2019   BILITOT 0.4 07/13/2019   ALKPHOS 48 06/29/2019   AST 21 07/13/2019   ALT 14 07/13/2019   PROT 7.2 07/13/2019   ALBUMIN 4.1 06/29/2019   CALCIUM 8.6 (L) 12/22/2019    GFRAA >60 12/22/2019    Speciality Comments: No specialty comments available.  Procedures:  No procedures performed Allergies: Patient has no known allergies.   Assessment / Plan:     Visit Diagnoses: Pain in both hands-I reviewed her x-rays from 2019 which were consistent with osteoarthritis.  Clinical and radiographic findings are consistent with severe osteoarthritis.  Joint protection muscle strengthening was discussed.  Use of Tylenol was discussed.  I have given her a handout on hand exercises.  Chronic pain of both knees-patient states she had surgery on  her right knee joint in the past which is still causes discomfort.  A handout on knee joint exercises was given.  Osteoarthritis feet-proper fitting shoes were discussed.  Rash and nonspecific skin eruption-patient has been followed by Dr. [place] Crane.  She has been using some topical agents and also Benadryl on as needed basis.  Osteopenia, unspecified location  Coronary artery calcification seen on CAT scan - Moderate LAD lesion noted  Essential hypertension  History of fatty infiltration of liver  History of hyperlipidemia  Primary insomnia  Other fatigue  Orders: No orders of the defined types were placed in this encounter.  No orders of the defined types were placed in this encounter.     Follow-Up Instructions: Return if symptoms worsen or fail to improve, for Osteoarthritis.   Bo Merino, MD  Note - This record has been created using Editor, commissioning.  Chart creation errors have been sought, but may not always  have been located. Such creation errors do not reflect on  the standard of medical care.

## 2020-01-11 NOTE — Progress Notes (Addendum)
Cardiology Office Note  Date: 01/12/2020   ID: [name], DOB 1951/06/08, MRN [id number]  PCP:  [name], MD  Cardiologist:  [name], MD Electrophysiologist:  None   Chief Complaint:  F/U CAD, HLD, DOE, Palpitations  History of Present Illness: Tanya [name] is a 69 y.o. 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   Cardiac catheterization 04/2019: 40% eccentric mLAD ISR with no high grade stenosis.  DAPT recommended > 12 months d/t overlapping stents LAD. Echo 06/2019 EF 55-60% Grade I DD.  At last encounter with Dr. Bronson Ing 09/18/2019 she had occasional CP which were atypical. Complained of sob after walking long distances.  Recent embolization of the left ICA/PCOM aneurysm 12/21/2019 by Dr. Estanislado Pandy complicated by right groin pain and hematoma resolved.STAT vascular US showed no evidence of pseudocyst,AVF,DVT.  Patient states he has been having increasing shortness of breath when performing exertional activities such as climbing stairs at work.  She describes rapid heartbeat/fluttering sensations in addition.  States she occasionally feels dizzy when these episodes occur but no presyncopal or syncopal sensations.  Past Medical History:  Diagnosis Date  . Aneurysm ([place])    left ICA 09/2019  . Anhedonia   . Anxiety   . CAD (coronary artery disease)    a. s/p DESx2 to proximal LAD in 06/2018 b. cath in 04/2019 showing 40% mid-LAD ISR with no significant stenosis along LM, LCx, or RCA  . Cystitis   . Elevated liver enzymes   . Fatty liver   . GERD (gastroesophageal reflux disease)   . Headache    migraine  . Hyperlipidemia   . Hypertension   . Insomnia   . Unspecified hypothyroidism   . Vaginitis   . Wears glasses   . Wears hearing aid    B/L    Past Surgical History:  Procedure Laterality Date  . CATARACT EXTRACTION W/PHACO Right 05/08/2019   Procedure: CATARACT  EXTRACTION PHACO AND INTRAOCULAR LENS PLACEMENT (IOC);  Surgeon: Baruch Goldmann, MD;  Location: [facility];  Service: Ophthalmology;  Laterality: Right;  CDE: 10.02  . COLONOSCOPY N/A 02/08/2016   Procedure: COLONOSCOPY;  Surgeon: [name], MD;  Location: [facility];  Service: Endoscopy;  Laterality: N/A;  1200 - moved to 11:45 - Ann to notify  . CORONARY STENT INTERVENTION N/A 06/06/2018   Procedure: CORONARY STENT INTERVENTION;  Surgeon: [name], [name], MD;  Location: [facility];  Service: Cardiovascular;  Laterality: N/A;  . INTRAVASCULAR PRESSURE WIRE/FFR STUDY N/A 06/06/2018   Procedure: INTRAVASCULAR PRESSURE WIRE/FFR STUDY;  Surgeon: [name], [name], MD;  Location: [facility];  Service: Cardiovascular;  Laterality: N/A;  . IR ANGIO INTRA EXTRACRAN SEL COM CAROTID INNOMINATE BILAT MOD SED  09/01/2019  . IR ANGIO INTRA EXTRACRAN SEL INTERNAL CAROTID UNI L MOD SED  12/21/2019  . IR ANGIO VERTEBRAL SEL VERTEBRAL BILAT MOD SED  09/01/2019  . IR ANGIOGRAM FOLLOW UP STUDY  12/21/2019  . IR TRANSCATH/EMBOLIZ  12/21/2019  . LEFT HEART CATH AND CORONARY ANGIOGRAPHY N/A 04/24/2019   Procedure: LEFT HEART CATH AND CORONARY ANGIOGRAPHY;  Surgeon: Belva Crome, MD;  Location: [facility];  Service: Cardiovascular;  Laterality: N/A;  . LEFT HEART CATHETERIZATION WITH CORONARY ANGIOGRAM N/A 11/19/2014   Procedure: LEFT HEART CATHETERIZATION WITH CORONARY ANGIOGRAM;  Surgeon: [name], MD;  Location: [facility];  Service: Cardiovascular;  Laterality: N/A;  .  RADIOLOGY WITH ANESTHESIA N/A 11/23/2019   Procedure: Drucilla Schmidt;  Surgeon: Luanne Bras, MD;  Location: [facility];  Service: Radiology;  Laterality: N/A;  . RADIOLOGY WITH ANESTHESIA N/A 12/21/2019   Procedure: EMBOLIZATION;  Surgeon: Luanne Bras, MD;  Location: [facility];  Service: Radiology;  Laterality: N/A;  . RIGHT/LEFT HEART CATH AND CORONARY ANGIOGRAPHY N/A 06/06/2018   Procedure: RIGHT/LEFT HEART CATH AND  CORONARY ANGIOGRAPHY;  Surgeon: [name], [name], MD;  Location: [facility];  Service: Cardiovascular;  Laterality: N/A;    Current Outpatient Medications  Medication Sig Dispense Refill  . acetaminophen (TYLENOL) 500 MG tablet Take 1,000 mg by mouth every 6 (six) hours as needed for moderate pain.    Marland Kitchen amLODipine (NORVASC) 2.5 MG tablet Take 1 tablet (2.5 mg total) by mouth daily. 30 tablet 5  . Ascorbic Acid (VITAMIN C) 1000 MG tablet Take 1,000 mg by mouth daily.     Marland Kitchen aspirin EC 81 MG tablet Take 81 mg by mouth at bedtime.    . Biotin 5000 MCG CAPS Take 5,000 mcg by mouth daily.    . busPIRone (BUSPAR) 5 MG tablet Take 5 mg by mouth 2 (two) times daily.    . Carboxymethylcellulose Sodium (THERATEARS OP) Place 1 drop into both eyes 2 (two) times daily.     . cloNIDine (CATAPRES) 0.2 MG tablet TAKE 1 TABLET BY MOUTH AT  BEDTIME FOR BLOOD PRESSURE (Patient taking differently: Take 0.2 mg by mouth at bedtime. ) 90 tablet 1  . clopidogrel (PLAVIX) 75 MG tablet Take 1 tablet (75 mg total) by mouth daily. 90 tablet 3  . diphenhydrAMINE (BENADRYL) 25 MG tablet Take 50 mg by mouth daily as needed for itching.     . nitroGLYCERIN (NITROSTAT) 0.4 MG SL tablet Place 1 tablet (0.4 mg total) under the tongue every 5 (five) minutes as needed. (Patient taking differently: Place 0.4 mg under the tongue every 5 (five) minutes x 3 doses as needed for chest pain. ) 25 tablet 2  . Omega-3 Fatty Acids (FISH OIL) 1000 MG CAPS Take 1,000 mg by mouth daily.    . pantoprazole (PROTONIX) 40 MG tablet Take 1 tablet (40 mg total) by mouth daily. 90 tablet 3  . potassium chloride SA (KLOR-CON) 20 MEQ tablet Take 1 tablet (20 mEq total) by mouth daily. 30 tablet 3  . rosuvastatin (CRESTOR) 40 MG tablet TAKE 1 TABLET BY MOUTH  DAILY (Patient taking differently: Take 40 mg by mouth daily. ) 90 tablet 3  . temazepam (RESTORIL) 15 MG capsule Take 1 capsule (15 mg total) by mouth at bedtime as needed for sleep. 30  capsule 0  . topiramate (TOPAMAX) 25 MG tablet Take 50 mg by mouth at bedtime.     . metoprolol succinate (TOPROL XL) 25 MG 24 hr tablet Take 0.5 tablets (12.5 mg total) by mouth daily. 45 tablet 1   No current facility-administered medications for this visit.   Allergies:  Patient has no known allergies.   Social History: The patient  reports that she has never smoked. She has never used smokeless tobacco. She reports that she does not drink alcohol or use drugs.   Family History: The patient's family history includes Dementia in her mother; Heart attack in her father; Heart disease in her father and mother; Hypertension in her father and mother.   ROS:  Please see the history of present illness. Otherwise, complete review of systems is positive for none.  All other systems are reviewed  and negative.   Physical Exam: VS:  BP 138/90   Pulse 71   Ht 5\' 4"  (1.626 m)   Wt 139 lb (63 kg)   SpO2 98%   BMI 23.86 kg/m , BMI Body mass index is 23.86 kg/m.  Wt Readings from Last 3 Encounters:  01/12/20 139 lb (63 kg)  12/21/19 139 lb 12.4 oz (63.4 kg)  12/21/19 130 lb (59 kg)    General: Patient appears comfortable at rest. Neck: Supple, no elevated JVP or carotid bruits, no thyromegaly. Lungs: Clear to auscultation, nonlabored breathing at rest. Cardiac: Regular rate and rhythm, no S3 or significant systolic murmur, no pericardial rub. Extremities: No pitting edema, distal pulses 2+. Skin: Warm and dry. Musculoskeletal: No kyphosis. Neuropsychiatric: Alert and oriented x3, affect grossly appropriate.  ECG: None today  Recent Labwork: 02/24/2019: TSH 1.23 06/29/2019: B Natriuretic Peptide 14.0 06/30/2019: Magnesium 2.2 07/13/2019: ALT 14; AST 21 12/22/2019: BUN 13; Creatinine, Ser 0.54; Hemoglobin 11.1; Platelets 264; Potassium 2.9; Sodium 143     Component Value Date/Time   CHOL 107 06/29/2019 0946   TRIG 84 12/22/2019 1213   HDL 48 06/29/2019 0946   CHOLHDL 2.2 06/29/2019 0946    VLDL 11 06/29/2019 0946   LDLCALC 48 06/29/2019 0946   LDLCALC 80 02/24/2019 0958    Other Studies Reviewed Today:  Cardiac catheterization 04/24/2019   40% eccentric mid LAD in-stent restenosis.  At completion of stent procedure in September 2020, there was residual stent deformity in this region.  There is no high-grade obstruction in the LAD stent.  The LAD is otherwise normal.  Normal left main  Normal circumflex  Normal RCA  Normal LV systolic function and end-diastolic pressure  RECOMMENDATIONS:   Dyspnea cannot be explained by findings on today's study based on absence of significant coronary obstruction and normal LV hemodynamics.  Continue dual antiplatelet therapy beyond 12 months due to overlapping stents in the LAD.  Diagnostic Dominance: Right  Intervention    Echocardiogram 06/29/2019  Left ventricular ejection fraction, by visual estimation, is 55 to 60%. The left ventricle has normal function. There is no left ventricular hypertrophy. 2. Left ventricular diastolic Doppler parameters are consistent with impaired relaxation pattern of LV diastolic filling. 3. Global right ventricle has normal systolic function.The right ventricular size is normal. No increase in right ventricular wall thickness. 4. Left atrial size was normal. 5. Right atrial size was normal. 6. The mitral valve is normal in structure. Trace mitral valve regurgitation. No evidence of mitral stenosis. 7. The tricuspid valve is normal in structure. Tricuspid valve regurgitation was not visualized by color flow Doppler. 8. The aortic valve is tricuspid Aortic valve regurgitation was not visualized by color flow Doppler. Mild aortic valve sclerosis without stenosis. 9. The pulmonic valve was not well visualized. Pulmonic valve regurgitation is not visualized by color flow Doppler. 10. Normal pulmonary artery systolic pressure. 11. The inferior vena cava IVC is small, suggesting low RA  pressure and hypovolemia.  Lower extremity arterial duplex 12/22/2019 Summary: No evidence of pseudoaneurysm, AVF or DVT  Assessment and Plan:  1. SOB (shortness of breath)   2. CAD in native artery   3. Essential hypertension   4. Hyperlipidemia LDL goal <70   5. Palpitations    1. SOB (shortness of breath) Patient complaining of recent increase in shortness of breath on exertion.  Patient states when she is at work and ascending stairs her shortness of breath becomes worse.  States it is occurring more often  and lasting longer.  She describes associated rapid heart rates/palpitations.  Also complains of associated dizziness at times when this occurs.  Denies any presyncope or syncopal episode.  No history of smoking or environmental exposure to chemicals or dust.  2. CAD in native artery Previous overlapping DES stents x2 to LAD in 2019.  She was recommended to continue DAPT therapy beyond 12 months due to overlapping stents.  Follow-up catheterization July 2020 she had 40% eccentric mid LAD in-stent restenosis.  No high-grade obstruction in the LAD stent, normal left main, circumflex, RCA,LV function.  Continue aspirin 81 mg, Plavix 75 mg, sublingual nitroglycerin as needed for chest pain  3. Essential hypertension Blood pressure is elevated today at 138/90.  Recent echocardiogram in September showed LVEF 55 to 60%.  Left ventricular diastolic parameters consistent with impaired relaxation of low LV diastolic filling.  Start Toprol-XL 12.5 mg daily.  Patient states she could not take metoprolol tartrate because she works second shift and her employer is strict about breaks and it would be hard for her to take the medicine twice a day.  Continue amlodipine 2.5 mg, clonidine 0.2 mg at bedtime.  4. Hyperlipidemia LDL goal <70 Last lipid profile total cholesterol 107, HDL 48, LDL 48, triglycerides 56 on 06/29/2019.  Continue Crestor 40 mg daily.  5.  Palpitations, racing heart rate Patient  states she has increasing rapid heartbeats with sensation of palpitations when performing more than normal activities.  States she sometimes feels dizzy when this occurs but no complaints of presyncope or near syncopal episodes.  States she has increasing shortness of breath since her vascular procedure in March.  Get a 14-day Zio patch.  Patient states she was previously treated for thyroid disease but currently not taking any medication.  Last TSH was 1.23 Feb 24, 2019  Medication Adjustments/Labs and Tests Ordered: Current medicines are reviewed at length with the patient today.  Concerns regarding medicines are outlined above.   Disposition: Follow-up with Dr Bronson Ing or APP 4 weeks  Signed, Levell July, NP 01/12/2020 11:51 AM    [facility] [place], [place], [place] [address] Phone: [phone]; Fax: [phone]

## 2020-01-12 ENCOUNTER — Telehealth: Payer: Self-pay | Admitting: Family Medicine

## 2020-01-12 ENCOUNTER — Encounter: Payer: Self-pay | Admitting: Family Medicine

## 2020-01-12 ENCOUNTER — Other Ambulatory Visit (INDEPENDENT_AMBULATORY_CARE_PROVIDER_SITE_OTHER): Payer: Medicare Other

## 2020-01-12 ENCOUNTER — Ambulatory Visit: Payer: PRIVATE HEALTH INSURANCE

## 2020-01-12 ENCOUNTER — Ambulatory Visit (INDEPENDENT_AMBULATORY_CARE_PROVIDER_SITE_OTHER): Payer: Medicare Other | Admitting: Family Medicine

## 2020-01-12 ENCOUNTER — Other Ambulatory Visit: Payer: Self-pay

## 2020-01-12 VITALS — BP 138/90 | HR 71 | Ht 64.0 in | Wt 139.0 lb

## 2020-01-12 DIAGNOSIS — I10 Essential (primary) hypertension: Secondary | ICD-10-CM | POA: Diagnosis not present

## 2020-01-12 DIAGNOSIS — I2510 Atherosclerotic heart disease of native coronary artery without angina pectoris: Secondary | ICD-10-CM | POA: Diagnosis not present

## 2020-01-12 DIAGNOSIS — R0602 Shortness of breath: Secondary | ICD-10-CM | POA: Diagnosis not present

## 2020-01-12 DIAGNOSIS — R002 Palpitations: Secondary | ICD-10-CM | POA: Diagnosis not present

## 2020-01-12 DIAGNOSIS — E785 Hyperlipidemia, unspecified: Secondary | ICD-10-CM | POA: Diagnosis not present

## 2020-01-12 MED ORDER — METOPROLOL SUCCINATE ER 25 MG PO TB24: 12.5000 mg | ORAL_TABLET | Freq: Every day | ORAL | 1 refills | Status: DC

## 2020-01-12 NOTE — Patient Instructions (Addendum)
Your physician wants you to follow-up in: [place], NP AND 1 [place] BLOOD PRESSURE CHECK    Your physician has recommended you make the following change in your medication:   START TOPROL XL 12.5 MG DAILY (1/2 TABLET)  ZIO MONITOR 14 DAYS - WILL BE MAILED TO YOUR HOME  Thank you for choosing [facility]!!

## 2020-01-12 NOTE — Telephone Encounter (Signed)
Pre-cert Verification for the following procedure     14 ZIO

## 2020-01-18 ENCOUNTER — Encounter: Payer: Self-pay | Admitting: Rheumatology

## 2020-01-18 ENCOUNTER — Other Ambulatory Visit: Payer: Self-pay

## 2020-01-18 ENCOUNTER — Ambulatory Visit (INDEPENDENT_AMBULATORY_CARE_PROVIDER_SITE_OTHER): Payer: Medicare Other | Admitting: Rheumatology

## 2020-01-18 VITALS — BP 132/67 | HR 56 | Resp 12 | Ht 64.0 in | Wt 139.2 lb

## 2020-01-18 DIAGNOSIS — M79641 Pain in right hand: Secondary | ICD-10-CM | POA: Diagnosis not present

## 2020-01-18 DIAGNOSIS — R5383 Other fatigue: Secondary | ICD-10-CM

## 2020-01-18 DIAGNOSIS — I2510 Atherosclerotic heart disease of native coronary artery without angina pectoris: Secondary | ICD-10-CM

## 2020-01-18 DIAGNOSIS — M8580 Other specified disorders of bone density and structure, unspecified site: Secondary | ICD-10-CM | POA: Diagnosis not present

## 2020-01-18 DIAGNOSIS — F5101 Primary insomnia: Secondary | ICD-10-CM

## 2020-01-18 DIAGNOSIS — R21 Rash and other nonspecific skin eruption: Secondary | ICD-10-CM | POA: Diagnosis not present

## 2020-01-18 DIAGNOSIS — M25561 Pain in right knee: Secondary | ICD-10-CM

## 2020-01-18 DIAGNOSIS — Z8639 Personal history of other endocrine, nutritional and metabolic disease: Secondary | ICD-10-CM

## 2020-01-18 DIAGNOSIS — M25562 Pain in left knee: Secondary | ICD-10-CM

## 2020-01-18 DIAGNOSIS — G8929 Other chronic pain: Secondary | ICD-10-CM

## 2020-01-18 DIAGNOSIS — I10 Essential (primary) hypertension: Secondary | ICD-10-CM

## 2020-01-18 DIAGNOSIS — M19071 Primary osteoarthritis, right ankle and foot: Secondary | ICD-10-CM

## 2020-01-18 DIAGNOSIS — Z8719 Personal history of other diseases of the digestive system: Secondary | ICD-10-CM

## 2020-01-18 DIAGNOSIS — M19072 Primary osteoarthritis, left ankle and foot: Secondary | ICD-10-CM

## 2020-01-18 DIAGNOSIS — M79642 Pain in left hand: Secondary | ICD-10-CM

## 2020-01-18 NOTE — Patient Instructions (Signed)
Journal for Nurse Practitioners, 15(4), 263-267. Retrieved July 07, 2018 from [email or web address]">  Knee Exercises Ask your health care provider which exercises are safe for you. Do exercises exactly as told by your health care provider and adjust them as directed. It is normal to feel mild stretching, pulling, tightness, or discomfort as you do these exercises. Stop right away if you feel sudden pain or your pain gets worse. Do not begin these exercises until told by your health care provider. Stretching and range-of-motion exercises These exercises warm up your muscles and joints and improve the movement and flexibility of your knee. These exercises also help to relieve pain and swelling. Knee extension, prone 1. Lie on your abdomen (prone position) on a bed. 2. Place your left / right knee just beyond the edge of the surface so your knee is not on the bed. You can put a towel under your left / right thigh just above your kneecap for comfort. 3. Relax your leg muscles and allow gravity to straighten your knee (extension). You should feel a stretch behind your left / right knee. 4. Hold this position for __________ seconds. 5. Scoot up so your knee is supported between repetitions. Repeat __________ times. Complete this exercise __________ times a day. Knee flexion, active  1. Lie on your back with both legs straight. If this causes back discomfort, bend your left / right knee so your foot is flat on the floor. 2. Slowly slide your left / right heel back toward your buttocks. Stop when you feel a gentle stretch in the front of your knee or thigh (flexion). 3. Hold this position for __________ seconds. 4. Slowly slide your left / right heel back to the starting position. Repeat __________ times. Complete this exercise __________ times a day. Quadriceps stretch, prone  1. Lie on your abdomen on a firm surface, such as a bed or padded floor. 2. Bend your left / right knee and hold  your ankle. If you cannot reach your ankle or pant leg, loop a belt around your foot and grab the belt instead. 3. Gently pull your heel toward your buttocks. Your knee should not slide out to the side. You should feel a stretch in the front of your thigh and knee (quadriceps). 4. Hold this position for __________ seconds. Repeat __________ times. Complete this exercise __________ times a day. Hamstring, supine 1. Lie on your back (supine position). 2. Loop a belt or towel over the ball of your left / right foot. The ball of your foot is on the walking surface, right under your toes. 3. Straighten your left / right knee and slowly pull on the belt to raise your leg until you feel a gentle stretch behind your knee (hamstring). ? Do not let your knee bend while you do this. ? Keep your other leg flat on the floor. 4. Hold this position for __________ seconds. Repeat __________ times. Complete this exercise __________ times a day. Strengthening exercises These exercises build strength and endurance in your knee. Endurance is the ability to use your muscles for a long time, even after they get tired. Quadriceps, isometric This exercise stretches the muscles in front of your thigh (quadriceps) without moving your knee joint (isometric). 1. Lie on your back with your left / right leg extended and your other knee bent. Put a rolled towel or small pillow under your knee if told by your health care provider. 2. Slowly tense the muscles in the front of your left /   right thigh. You should see your kneecap slide up toward your hip or see increased dimpling just above the knee. This motion will push the back of the knee toward the floor. 3. For __________ seconds, hold the muscle as tight as you can without increasing your pain. 4. Relax the muscles slowly and completely. Repeat __________ times. Complete this exercise __________ times a day. Straight leg raises This exercise stretches the muscles in front  of your thigh (quadriceps) and the muscles that move your hips (hip flexors). 1. Lie on your back with your left / right leg extended and your other knee bent. 2. Tense the muscles in the front of your left / right thigh. You should see your kneecap slide up or see increased dimpling just above the knee. Your thigh may even shake a bit. 3. Keep these muscles tight as you raise your leg 4-6 inches (10-15 cm) off the floor. Do not let your knee bend. 4. Hold this position for __________ seconds. 5. Keep these muscles tense as you lower your leg. 6. Relax your muscles slowly and completely after each repetition. Repeat __________ times. Complete this exercise __________ times a day. Hamstring, isometric 1. Lie on your back on a firm surface. 2. Bend your left / right knee about __________ degrees. 3. Dig your left / right heel into the surface as if you are trying to pull it toward your buttocks. Tighten the muscles in the back of your thighs (hamstring) to "dig" as hard as you can without increasing any pain. 4. Hold this position for __________ seconds. 5. Release the tension gradually and allow your muscles to relax completely for __________ seconds after each repetition. Repeat __________ times. Complete this exercise __________ times a day. Hamstring curls If told by your health care provider, do this exercise while wearing ankle weights. Begin with __________ lb weights. Then increase the weight by 1 lb (0.5 kg) increments. Do not wear ankle weights that are more than __________ lb. 1. Lie on your abdomen with your legs straight. 2. Bend your left / right knee as far as you can without feeling pain. Keep your hips flat against the floor. 3. Hold this position for __________ seconds. 4. Slowly lower your leg to the starting position. Repeat __________ times. Complete this exercise __________ times a day. Squats This exercise strengthens the muscles in front of your thigh and knee  (quadriceps). 1. Stand in front of a table, with your feet and knees pointing straight ahead. You may rest your hands on the table for balance but not for support. 2. Slowly bend your knees and lower your hips like you are going to sit in a chair. ? Keep your weight over your heels, not over your toes. ? Keep your lower legs upright so they are parallel with the table legs. ? Do not let your hips go lower than your knees. ? Do not bend lower than told by your health care provider. ? If your knee pain increases, do not bend as low. 3. Hold the squat position for __________ seconds. 4. Slowly push with your legs to return to standing. Do not use your hands to pull yourself to standing. Repeat __________ times. Complete this exercise __________ times a day. Wall slides This exercise strengthens the muscles in front of your thigh and knee (quadriceps). 1. Lean your back against a smooth wall or door, and walk your feet out 18-24 inches (46-61 cm) from it. 2. Place your feet hip-width apart. 3.   Slowly slide down the wall or door until your knees bend __________ degrees. Keep your knees over your heels, not over your toes. Keep your knees in line with your hips. 4. Hold this position for __________ seconds. Repeat __________ times. Complete this exercise __________ times a day. Straight leg raises This exercise strengthens the muscles that rotate the leg at the hip and move it away from your body (hip abductors). 1. Lie on your side with your left / right leg in the top position. Lie so your head, shoulder, knee, and hip line up. You may bend your bottom knee to help you keep your balance. 2. Roll your hips slightly forward so your hips are stacked directly over each other and your left / right knee is facing forward. 3. Leading with your heel, lift your top leg 4-6 inches (10-15 cm). You should feel the muscles in your outer hip lifting. ? Do not let your foot drift forward. ? Do not let your knee  roll toward the ceiling. 4. Hold this position for __________ seconds. 5. Slowly return your leg to the starting position. 6. Let your muscles relax completely after each repetition. Repeat __________ times. Complete this exercise __________ times a day. Straight leg raises This exercise stretches the muscles that move your hips away from the front of the pelvis (hip extensors). 1. Lie on your abdomen on a firm surface. You can put a pillow under your hips if that is more comfortable. 2. Tense the muscles in your buttocks and lift your left / right leg about 4-6 inches (10-15 cm). Keep your knee straight as you lift your leg. 3. Hold this position for __________ seconds. 4. Slowly lower your leg to the starting position. 5. Let your leg relax completely after each repetition. Repeat __________ times. Complete this exercise __________ times a day. This information is not intended to replace advice given to you by your health care provider. Make sure you discuss any questions you have with your health care provider. Document Revised: 07/08/2018 Document Reviewed: 07/08/2018 [employer] Patient Education  2020 [employer] Hand Exercises Hand exercises can be helpful for almost anyone. These exercises can strengthen the hands, improve flexibility and movement, and increase blood flow to the hands. These results can make work and daily tasks easier. Hand exercises can be especially helpful for people who have joint pain from arthritis or have nerve damage from overuse (carpal tunnel syndrome). These exercises can also help people who have injured a hand. Exercises Most of these hand exercises are gentle stretching and motion exercises. It is usually safe to do them often throughout the day. Warming up your hands before exercise may help to reduce stiffness. You can do this with gentle massage or by placing your hands in warm water for 10-15 minutes. It is normal to feel some stretching, pulling,  tightness, or mild discomfort as you begin new exercises. This will gradually improve. Stop an exercise right away if you feel sudden, severe pain or your pain gets worse. Ask your health care provider which exercises are best for you. Knuckle bend or "claw" fist 1. Stand or sit with your arm, hand, and all five fingers pointed straight up. Make sure to keep your wrist straight during the exercise. 2. Gently bend your fingers down toward your palm until the tips of your fingers are touching the top of your palm. Keep your big knuckle straight and just bend the small knuckles in your fingers. 3. Hold this position for   __________ seconds. 4. Straighten (extend) your fingers back to the starting position. Repeat this exercise 5-10 times with each hand. Full finger fist 1. Stand or sit with your arm, hand, and all five fingers pointed straight up. Make sure to keep your wrist straight during the exercise. 2. Gently bend your fingers into your palm until the tips of your fingers are touching the middle of your palm. 3. Hold this position for __________ seconds. 4. Extend your fingers back to the starting position, stretching every joint fully. Repeat this exercise 5-10 times with each hand. Straight fist 1. Stand or sit with your arm, hand, and all five fingers pointed straight up. Make sure to keep your wrist straight during the exercise. 2. Gently bend your fingers at the big knuckle, where your fingers meet your hand, and the middle knuckle. Keep the knuckle at the tips of your fingers straight and try to touch the bottom of your palm. 3. Hold this position for __________ seconds. 4. Extend your fingers back to the starting position, stretching every joint fully. Repeat this exercise 5-10 times with each hand. Tabletop 1. Stand or sit with your arm, hand, and all five fingers pointed straight up. Make sure to keep your wrist straight during the exercise. 2. Gently bend your fingers at the big  knuckle, where your fingers meet your hand, as far down as you can while keeping the small knuckles in your fingers straight. Think of forming a tabletop with your fingers. 3. Hold this position for __________ seconds. 4. Extend your fingers back to the starting position, stretching every joint fully. Repeat this exercise 5-10 times with each hand. Finger spread 1. Place your hand flat on a table with your palm facing down. Make sure your wrist stays straight as you do this exercise. 2. Spread your fingers and thumb apart from each other as far as you can until you feel a gentle stretch. Hold this position for __________ seconds. 3. Bring your fingers and thumb tight together again. Hold this position for __________ seconds. Repeat this exercise 5-10 times with each hand. Making circles 1. Stand or sit with your arm, hand, and all five fingers pointed straight up. Make sure to keep your wrist straight during the exercise. 2. Make a circle by touching the tip of your thumb to the tip of your index finger. 3. Hold for __________ seconds. Then open your hand wide. 4. Repeat this motion with your thumb and each finger on your hand. Repeat this exercise 5-10 times with each hand. Thumb motion 1. Sit with your forearm resting on a table and your wrist straight. Your thumb should be facing up toward the ceiling. Keep your fingers relaxed as you move your thumb. 2. Lift your thumb up as high as you can toward the ceiling. Hold for __________ seconds. 3. Bend your thumb across your palm as far as you can, reaching the tip of your thumb for the small finger (pinkie) side of your palm. Hold for __________ seconds. Repeat this exercise 5-10 times with each hand. Grip strengthening  1. Hold a stress ball or other soft ball in the middle of your hand. 2. Slowly increase the pressure, squeezing the ball as much as you can without causing pain. Think of bringing the tips of your fingers into the middle of  your palm. All of your finger joints should bend when doing this exercise. 3. Hold your squeeze for __________ seconds, then relax. Repeat this exercise 5-10 times with each   hand. Contact a health care provider if:  Your hand pain or discomfort gets much worse when you do an exercise.  Your hand pain or discomfort does not improve within 2 hours after you exercise. If you have any of these problems, stop doing these exercises right away. Do not do them again unless your health care provider says that you can. Get help right away if:  You develop sudden, severe hand pain or swelling. If this happens, stop doing these exercises right away. Do not do them again unless your health care provider says that you can. This information is not intended to replace advice given to you by your health care provider. Make sure you discuss any questions you have with your health care provider. Document Revised: 01/08/2019 Document Reviewed: 09/18/2018 [employer] Patient Education  2020 [employer]  

## 2020-01-19 ENCOUNTER — Other Ambulatory Visit: Payer: Self-pay

## 2020-01-19 ENCOUNTER — Ambulatory Visit (INDEPENDENT_AMBULATORY_CARE_PROVIDER_SITE_OTHER): Payer: Medicare Other | Admitting: *Deleted

## 2020-01-19 VITALS — BP 138/72 | HR 58

## 2020-01-19 DIAGNOSIS — I10 Essential (primary) hypertension: Secondary | ICD-10-CM

## 2020-01-19 NOTE — Progress Notes (Signed)
Patient in office for BP check this morning - recent addition of Toprol XL 12.5mg  daily.    BP check this morning was 138/72  58   Recheck with her monitor was 127/71  58  Her previous readings are:  01/13/20 - 135/71  67 01/14/20 - 134/62  64 01/15/20 - 120/62  64 01/16/20 - 136/67  64 01/17/20 - 122/62  66 01/18/20 - 130/63  65

## 2020-01-20 ENCOUNTER — Encounter: Payer: Medicare Other | Admitting: Family Medicine

## 2020-01-20 NOTE — Progress Notes (Signed)
Husband notified  

## 2020-01-20 NOTE — Progress Notes (Signed)
Verta Ellen., NP  Laurine Blazer, LPN  Ok BP. Did the patient say how she was feeling ? Tell her the BP is looking much better. We need to stay where we are at the moment with the medication dosage. Please tell her that. Thanks       Previous Messages

## 2020-01-25 ENCOUNTER — Other Ambulatory Visit: Payer: Self-pay | Admitting: Family Medicine

## 2020-01-25 ENCOUNTER — Telehealth: Payer: Self-pay

## 2020-01-25 DIAGNOSIS — E782 Mixed hyperlipidemia: Secondary | ICD-10-CM

## 2020-01-25 DIAGNOSIS — I10 Essential (primary) hypertension: Secondary | ICD-10-CM

## 2020-01-25 MED ORDER — TEMAZEPAM 15 MG PO CAPS: 15.0000 mg | ORAL_CAPSULE | Freq: Every evening | ORAL | 4 refills | Status: DC | PRN

## 2020-01-25 NOTE — Telephone Encounter (Signed)
Temazepam needs to be refilled. Husband came to office and said she cannot sleep. Please advise

## 2020-01-25 NOTE — Telephone Encounter (Signed)
Refill sent in and labs ordered

## 2020-01-25 NOTE — Telephone Encounter (Signed)
Medication is at [place], also needs fasting lipid and hepatic in next 1 to 2 weeks, none in over 6 months and on crestor

## 2020-01-26 ENCOUNTER — Telehealth: Payer: Self-pay | Admitting: *Deleted

## 2020-01-27 DIAGNOSIS — E782 Mixed hyperlipidemia: Secondary | ICD-10-CM | POA: Diagnosis not present

## 2020-01-27 LAB — HEPATIC FUNCTION PANEL
AG Ratio: 1.7 (calc) (ref 1.0–2.5)
ALT: 13 U/L (ref 6–29)
AST: 18 U/L (ref 10–35)
Albumin: 4.3 g/dL (ref 3.6–5.1)
Alkaline phosphatase (APISO): 56 U/L (ref 37–153)
Bilirubin, Direct: 0.1 mg/dL (ref 0.0–0.2)
Globulin: 2.6 g/dL (calc) (ref 1.9–3.7)
Indirect Bilirubin: 0.3 mg/dL (calc) (ref 0.2–1.2)
Total Bilirubin: 0.4 mg/dL (ref 0.2–1.2)
Total Protein: 6.9 g/dL (ref 6.1–8.1)

## 2020-01-27 LAB — LIPID PANEL
Cholesterol: 120 mg/dL (ref <200)
HDL: 60 mg/dL (ref >=50)
LDL Cholesterol (Calc): 44 mg/dL (calc)
Non-HDL Cholesterol (Calc): 60 mg/dL (calc) (ref <130)
Total CHOL/HDL Ratio: 2 (calc) (ref <5.0)
Triglycerides: 80 mg/dL (ref <150)

## 2020-01-29 ENCOUNTER — Ambulatory Visit (HOSPITAL_COMMUNITY): Admission: RE | Admit: 2020-01-29 | Payer: PRIVATE HEALTH INSURANCE | Source: Ambulatory Visit

## 2020-02-08 DIAGNOSIS — R002 Palpitations: Secondary | ICD-10-CM | POA: Diagnosis not present

## 2020-02-09 ENCOUNTER — Other Ambulatory Visit: Payer: Self-pay | Admitting: *Deleted

## 2020-02-09 DIAGNOSIS — R002 Palpitations: Secondary | ICD-10-CM

## 2020-02-10 NOTE — Telephone Encounter (Signed)
Resulted and in chart

## 2020-02-11 ENCOUNTER — Telehealth: Payer: Self-pay | Admitting: *Deleted

## 2020-02-11 NOTE — Telephone Encounter (Signed)
-----   Message from Herminio Commons, MD sent at 02/10/2020  5:37 PM EDT ----- Regular rhythm with short bursts of rapid heartbeats.

## 2020-02-15 DIAGNOSIS — H16221 Keratoconjunctivitis sicca, not specified as Sjogren's, right eye: Secondary | ICD-10-CM | POA: Diagnosis not present

## 2020-02-15 DIAGNOSIS — H2512 Age-related nuclear cataract, left eye: Secondary | ICD-10-CM | POA: Diagnosis not present

## 2020-02-15 DIAGNOSIS — Z961 Presence of intraocular lens: Secondary | ICD-10-CM | POA: Diagnosis not present

## 2020-02-19 NOTE — Telephone Encounter (Signed)
Patient's husband informed. Copy sent to PCP 

## 2020-02-22 NOTE — Progress Notes (Signed)
Cardiology Office Note  Date: 02/23/2020   ID: Tanya [name], Tanya [name] 1951/03/07, MRN [id number]  PCP:  [name], MD  Cardiologist:  [name], MD Electrophysiologist:  None   Chief Complaint:  F/U CAD, HLD, DOE, Palpitations  History of Present Illness: Tanya [name] is a 69 y.o. 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   Cardiac catheterization 04/2019: 40% eccentric mLAD ISR with no high grade stenosis. DAPT recommended > 12 months d/t overlapping stents LAD. Echo 06/2019 EF 55-60% Grade I DD.  At last encounter with Dr. Bronson Ing 09/18/2019 she had occasional CPs which were atypical. Complained of sob after walking long distances.  Recent embolization of the left ICA/PCOM aneurysm 12/21/2019 by Dr. Estanislado Pandy complicated by right groin pain and hematoma resolved.STAT vascular US showed no evidence of pseudocyst, AVF,DVT.  Patient states she has been doing well from a cardiac standpoint.  She complains of occasional dizziness but no presyncopal or syncopal episodes and some shortness of breath occasionally which has no predictable pattern and does not necessarily associated with exertion.  States her blood pressure has been doing well.  She denied any CVA or TIA-like symptoms, orthostatic symptoms.  Currently states she is not having any palpitations.  We reviewed the results of her event monitor.  She verbalizes understanding.  Toprol-XL 12.5 mg was started at last visit.  She states she feels much better after starting this medication with less palpitations.   Past Medical History:  Diagnosis Date  . Aneurysm ([place])    left ICA 09/2019  . Anhedonia   . Anxiety   . CAD (coronary artery disease)    a. s/p DESx2 to proximal LAD in 06/2018 b. cath in 04/2019 showing 40% mid-LAD ISR with no significant stenosis along LM, LCx, or RCA  . Cystitis   . Elevated liver enzymes   . Fatty liver   .  GERD (gastroesophageal reflux disease)   . Headache    migraine  . Hyperlipidemia   . Hypertension   . Insomnia   . Unspecified hypothyroidism   . Vaginitis   . Wears glasses   . Wears hearing aid    B/L    Past Surgical History:  Procedure Laterality Date  . CATARACT EXTRACTION W/PHACO Right 05/08/2019   Procedure: CATARACT EXTRACTION PHACO AND INTRAOCULAR LENS PLACEMENT (IOC);  Surgeon: Baruch Goldmann, MD;  Location: [facility];  Service: Ophthalmology;  Laterality: Right;  CDE: 10.02  . COLONOSCOPY N/A 02/08/2016   Procedure: COLONOSCOPY;  Surgeon: [name], MD;  Location: [facility];  Service: Endoscopy;  Laterality: N/A;  1200 - moved to 11:45 - Ann to notify  . CORONARY STENT INTERVENTION N/A 06/06/2018   Procedure: CORONARY STENT INTERVENTION;  Surgeon: [name], [name], MD;  Location: [facility];  Service: Cardiovascular;  Laterality: N/A;  . INTRAVASCULAR PRESSURE WIRE/FFR STUDY N/A 06/06/2018   Procedure: INTRAVASCULAR PRESSURE WIRE/FFR STUDY;  Surgeon: [name], [name], MD;  Location: [facility];  Service: Cardiovascular;  Laterality: N/A;  . IR ANGIO INTRA EXTRACRAN SEL COM CAROTID INNOMINATE BILAT MOD SED  09/01/2019  . IR ANGIO INTRA EXTRACRAN SEL INTERNAL CAROTID UNI L MOD SED  12/21/2019  . IR ANGIO VERTEBRAL SEL VERTEBRAL BILAT MOD SED  09/01/2019  . IR ANGIOGRAM FOLLOW UP STUDY  12/21/2019  . IR TRANSCATH/EMBOLIZ  12/21/2019  . LEFT HEART CATH AND CORONARY ANGIOGRAPHY N/A 04/24/2019  Procedure: LEFT HEART CATH AND CORONARY ANGIOGRAPHY;  Surgeon: Belva Crome, MD;  Location: [facility];  Service: Cardiovascular;  Laterality: N/A;  . LEFT HEART CATHETERIZATION WITH CORONARY ANGIOGRAM N/A 11/19/2014   Procedure: LEFT HEART CATHETERIZATION WITH CORONARY ANGIOGRAM;  Surgeon: [name], MD;  Location: [facility];  Service: Cardiovascular;  Laterality: N/A;  . RADIOLOGY WITH ANESTHESIA N/A 11/23/2019   Procedure: Drucilla Schmidt;  Surgeon: Luanne Bras, MD;  Location: [facility];  Service: Radiology;  Laterality: N/A;  . RADIOLOGY WITH ANESTHESIA N/A 12/21/2019   Procedure: EMBOLIZATION;  Surgeon: Luanne Bras, MD;  Location: [facility];  Service: Radiology;  Laterality: N/A;  . RIGHT/LEFT HEART CATH AND CORONARY ANGIOGRAPHY N/A 06/06/2018   Procedure: RIGHT/LEFT HEART CATH AND CORONARY ANGIOGRAPHY;  Surgeon: [name], [name], MD;  Location: [facility];  Service: Cardiovascular;  Laterality: N/A;    Current Outpatient Medications  Medication Sig Dispense Refill  . acetaminophen (TYLENOL) 500 MG tablet Take 1,000 mg by mouth every 6 (six) hours as needed for moderate pain.    Marland Kitchen amLODipine (NORVASC) 2.5 MG tablet Take 1 tablet (2.5 mg total) by mouth daily. 30 tablet 5  . Ascorbic Acid (VITAMIN C) 1000 MG tablet Take 1,000 mg by mouth daily.     Marland Kitchen aspirin EC 81 MG tablet Take 81 mg by mouth at bedtime.    . Biotin 5000 MCG CAPS Take 5,000 mcg by mouth daily.    . Carboxymethylcellulose Sodium (THERATEARS OP) Place 1 drop into both eyes 2 (two) times daily.     . cloNIDine (CATAPRES) 0.2 MG tablet TAKE 1 TABLET BY MOUTH AT  BEDTIME FOR BLOOD PRESSURE (Patient taking differently: Take 0.2 mg by mouth at bedtime. ) 90 tablet 1  . clopidogrel (PLAVIX) 75 MG tablet Take 1 tablet (75 mg total) by mouth daily. 90 tablet 3  . diphenhydrAMINE (BENADRYL) 25 MG tablet Take 50 mg by mouth daily as needed for itching.     . metoprolol succinate (TOPROL XL) 25 MG 24 hr tablet Take 0.5 tablets (12.5 mg total) by mouth daily. 45 tablet 1  . nitroGLYCERIN (NITROSTAT) 0.4 MG SL tablet Place 1 tablet (0.4 mg total) under the tongue every 5 (five) minutes as needed. (Patient taking differently: Place 0.4 mg under the tongue every 5 (five) minutes x 3 doses as needed for chest pain. ) 25 tablet 2  . Omega-3 Fatty Acids (FISH OIL) 1000 MG CAPS Take 1,000 mg by mouth daily.    . pantoprazole (PROTONIX) 40 MG tablet Take 1 tablet (40 mg total) by mouth daily.  90 tablet 3  . potassium chloride SA (KLOR-CON) 20 MEQ tablet Take 1 tablet (20 mEq total) by mouth daily. 30 tablet 3  . rosuvastatin (CRESTOR) 40 MG tablet TAKE 1 TABLET BY MOUTH  DAILY (Patient taking differently: Take 40 mg by mouth daily. ) 90 tablet 3  . temazepam (RESTORIL) 15 MG capsule Take 1 capsule (15 mg total) by mouth at bedtime as needed for sleep. 30 capsule 0  . temazepam (RESTORIL) 15 MG capsule Take 1 capsule (15 mg total) by mouth at bedtime as needed for sleep. 30 capsule 4  . topiramate (TOPAMAX) 25 MG tablet Take 50 mg by mouth at bedtime.      No current facility-administered medications for this visit.   Allergies:  Patient has no known allergies.   Social History: The patient  reports that she has never smoked. She has never used smokeless  tobacco. She reports that she does not drink alcohol or use drugs.   Family History: The patient's family history includes Dementia in her mother; Heart attack in her father; Heart disease in her father and mother; Hypertension in her father and mother.   ROS:  Please see the history of present illness. Otherwise, complete review of systems is positive for none.  All other systems are reviewed and negative.   Physical Exam: VS:  BP 126/64   Pulse 64   Ht 5\' 4"  (1.626 m)   Wt 134 lb 6.4 oz (61 kg)   SpO2 99%   BMI 23.07 kg/m , BMI Body mass index is 23.07 kg/m.  Wt Readings from Last 3 Encounters:  02/23/20 134 lb 6.4 oz (61 kg)  01/18/20 139 lb 3.2 oz (63.1 kg)  01/12/20 139 lb (63 kg)    General: Patient appears comfortable at rest. Neck: Supple, no elevated JVP or carotid bruits, no thyromegaly. Lungs: Clear to auscultation, nonlabored breathing at rest. Cardiac: Regular rate and rhythm, no S3 or significant systolic murmur, no pericardial rub. Extremities: No pitting edema, distal pulses 2+. Skin: Warm and dry. Musculoskeletal: No kyphosis. Neuropsychiatric: Alert and oriented x3, affect grossly  appropriate.  ECG: None today  Recent Labwork: 02/24/2019: TSH 1.23 06/29/2019: B Natriuretic Peptide 14.0 06/30/2019: Magnesium 2.2 12/22/2019: BUN 13; Creatinine, Ser 0.54; Hemoglobin 11.1; Platelets 264; Potassium 2.9; Sodium 143 01/27/2020: ALT 13; AST 18     Component Value Date/Time   CHOL 120 01/27/2020 1022   TRIG 80 01/27/2020 1022   HDL 60 01/27/2020 1022   CHOLHDL 2.0 01/27/2020 1022   VLDL 11 06/29/2019 0946   LDLCALC 44 01/27/2020 1022    Other Studies Reviewed Today:  Long-term event monitor 01/12/2020. Dr. Court Joy interpretation: Regular rhythm with short bursts of rapid heartbeats.  Preliminary findings 01/12/2020 Patient had a min HR of 45 bpm, max HR of 141 bpm, and avg HR of 68 bpm. Predominant underlying rhythm was Sinus Rhythm. 3 Supraventricular Tachycardia runs occurred, the run with the fastest interval lasting 4 beats with a max rate of 141 bpm, the longest lasting 13.4 secs with an avg rate of 102 bpm. Some episodes of Supraventricular Tachycardia may be possible Atrial Tachycardia with variable block. Isolated SVEs were rare (<1.0%), SVE Couplets were rare (<1.0%), and SVE Triplets were rare (<1.0%). Isolated VEs were rare (<1.0%), and no VE Couplets or VE Triplets were present.  Cardiac catheterization 04/24/2019   40% eccentric mid LAD in-stent restenosis.  At completion of stent procedure in September 2020, there was residual stent deformity in this region.  There is no high-grade obstruction in the LAD stent.  The LAD is otherwise normal.  Normal left main  Normal circumflex  Normal RCA  Normal LV systolic function and end-diastolic pressure  RECOMMENDATIONS:   Dyspnea cannot be explained by findings on today's study based on absence of significant coronary obstruction and normal LV hemodynamics.  Continue dual antiplatelet therapy beyond 12 months due to overlapping stents in the LAD.  Diagnostic Dominance:  Right  Intervention    Echocardiogram 06/29/2019  Left ventricular ejection fraction, by visual estimation, is 55 to 60%. The left ventricle has normal function. There is no left ventricular hypertrophy. 2. Left ventricular diastolic Doppler parameters are consistent with impaired relaxation pattern of LV diastolic filling. 3. Global right ventricle has normal systolic function.The right ventricular size is normal. No increase in right ventricular wall thickness. 4. Left atrial size was normal. 5. Right atrial  size was normal. 6. The mitral valve is normal in structure. Trace mitral valve regurgitation. No evidence of mitral stenosis. 7. The tricuspid valve is normal in structure. Tricuspid valve regurgitation was not visualized by color flow Doppler. 8. The aortic valve is tricuspid Aortic valve regurgitation was not visualized by color flow Doppler. Mild aortic valve sclerosis without stenosis. 9. The pulmonic valve was not well visualized. Pulmonic valve regurgitation is not visualized by color flow Doppler. 10. Normal pulmonary artery systolic pressure. 11. The inferior vena cava IVC is small, suggesting low RA pressure and hypovolemia.  Lower extremity arterial duplex 12/22/2019 Summary: No evidence of pseudoaneurysm, AVF or DVT  Assessment and Plan:   1. SOB (shortness of breath) Currently only complaining of occasional episodes of shortness of breath not necessarily associated with exertional activity.  States the shortness of breath is usually short-lived and resolves.  2. CAD in native artery Previous overlapping DES stents x2 to LAD in 2019.  She was recommended to continue DAPT therapy beyond 12 months due to overlapping stents.  Follow-up catheterization July 2020 she had 40% eccentric mid LAD in-stent restenosis.  No high-grade obstruction in the LAD stent, normal left main, circumflex, RCA,LV function.  Currently denies any anginal or exertional symptoms.  Continue  aspirin 81 mg, Plavix 75 mg, sublingual nitroglycerin as needed for chest pain, Toprol-XL 12.5 mg  3. Essential hypertension Blood pressure within normal limits today at 126/64.  Recent echocardiogram in September showed LVEF 55 to 60%.  Left ventricular diastolic parameters consistent with impaired relaxation of low LV diastolic filling.  Patient states she could not take metoprolol tartrate because she works second shift and her employer is strict about breaks and it would be hard for her to take the medicine twice a day.  Continue amlodipine 2.5 mg, clonidine 0.2 mg at bedtime.  4. Hyperlipidemia LDL goal <70 Last lipid profile total cholesterol 107, HDL 48, LDL 48, triglycerides 56 on 06/29/2019.  Continue Crestor 40 mg daily.  5.  Palpitations, racing heart rate Recent event monitor showed no significant episodes of tachyarrhythmias.  She did have 2 short events.  The longest event was 13.4 seconds with a rate of 102.  One other event of 4 beats with a maximum rate of 141.  There were no other tahky or bradycardia arrhythmias.  Underlying rhythm was sinus.  She does not voice any complaints of palpitations at today's visit, continue Toprol-XL 12.5 mg daily.    Medication Adjustments/Labs and Tests Ordered: Current medicines are reviewed at length with the patient today.  Concerns regarding medicines are outlined above.   Disposition: Follow-up with Dr Bronson Ing or APP 1 year  Signed, Levell July, NP 02/23/2020 11:32 AM    [facility], [place], [place] [address] Phone: [phone]; Fax: [phone]

## 2020-02-23 ENCOUNTER — Encounter: Payer: Self-pay | Admitting: Family Medicine

## 2020-02-23 ENCOUNTER — Ambulatory Visit (INDEPENDENT_AMBULATORY_CARE_PROVIDER_SITE_OTHER): Payer: Medicare Other | Admitting: Family Medicine

## 2020-02-23 ENCOUNTER — Other Ambulatory Visit: Payer: Self-pay

## 2020-02-23 VITALS — BP 126/64 | HR 64 | Ht 64.0 in | Wt 134.4 lb

## 2020-02-23 DIAGNOSIS — I2510 Atherosclerotic heart disease of native coronary artery without angina pectoris: Secondary | ICD-10-CM

## 2020-02-23 DIAGNOSIS — R0602 Shortness of breath: Secondary | ICD-10-CM

## 2020-02-23 DIAGNOSIS — E785 Hyperlipidemia, unspecified: Secondary | ICD-10-CM | POA: Diagnosis not present

## 2020-02-23 DIAGNOSIS — R002 Palpitations: Secondary | ICD-10-CM | POA: Diagnosis not present

## 2020-02-23 DIAGNOSIS — I10 Essential (primary) hypertension: Secondary | ICD-10-CM

## 2020-02-23 NOTE — Patient Instructions (Addendum)

## 2020-03-07 DIAGNOSIS — H26491 Other secondary cataract, right eye: Secondary | ICD-10-CM | POA: Diagnosis not present

## 2020-03-07 DIAGNOSIS — H2512 Age-related nuclear cataract, left eye: Secondary | ICD-10-CM | POA: Diagnosis not present

## 2020-03-07 DIAGNOSIS — H16221 Keratoconjunctivitis sicca, not specified as Sjogren's, right eye: Secondary | ICD-10-CM | POA: Diagnosis not present

## 2020-03-22 ENCOUNTER — Other Ambulatory Visit: Payer: Self-pay | Admitting: *Deleted

## 2020-03-22 MED ORDER — METOPROLOL SUCCINATE ER 25 MG PO TB24: 12.5000 mg | ORAL_TABLET | Freq: Every day | ORAL | 1 refills | Status: DC

## 2020-03-28 ENCOUNTER — Other Ambulatory Visit: Payer: Self-pay

## 2020-03-28 ENCOUNTER — Emergency Department (HOSPITAL_COMMUNITY): Payer: Medicare Other

## 2020-03-28 ENCOUNTER — Encounter (HOSPITAL_COMMUNITY): Payer: Self-pay | Admitting: Emergency Medicine

## 2020-03-28 ENCOUNTER — Emergency Department (HOSPITAL_COMMUNITY)
Admission: EM | Admit: 2020-03-28 | Discharge: 2020-03-28 | Disposition: A | Discharge status: Home / Self Care | Payer: Medicare Other | Attending: Emergency Medicine | Admitting: Emergency Medicine

## 2020-03-28 ENCOUNTER — Ambulatory Visit (INDEPENDENT_AMBULATORY_CARE_PROVIDER_SITE_OTHER): Payer: Medicare Other | Admitting: Family Medicine

## 2020-03-28 ENCOUNTER — Encounter: Payer: Self-pay | Admitting: Family Medicine

## 2020-03-28 VITALS — BP 120/78 | HR 64 | Temp 97.2°F | Ht 64.0 in | Wt 136.0 lb

## 2020-03-28 DIAGNOSIS — R0789 Other chest pain: Secondary | ICD-10-CM | POA: Diagnosis not present

## 2020-03-28 DIAGNOSIS — R0609 Other forms of dyspnea: Secondary | ICD-10-CM

## 2020-03-28 DIAGNOSIS — K219 Gastro-esophageal reflux disease without esophagitis: Secondary | ICD-10-CM

## 2020-03-28 DIAGNOSIS — I119 Hypertensive heart disease without heart failure: Secondary | ICD-10-CM | POA: Diagnosis not present

## 2020-03-28 DIAGNOSIS — Z7982 Long term (current) use of aspirin: Secondary | ICD-10-CM | POA: Diagnosis not present

## 2020-03-28 DIAGNOSIS — R55 Syncope and collapse: Secondary | ICD-10-CM

## 2020-03-28 DIAGNOSIS — R079 Chest pain, unspecified: Secondary | ICD-10-CM

## 2020-03-28 DIAGNOSIS — R0602 Shortness of breath: Secondary | ICD-10-CM

## 2020-03-28 DIAGNOSIS — R0600 Dyspnea, unspecified: Secondary | ICD-10-CM

## 2020-03-28 DIAGNOSIS — Z79899 Other long term (current) drug therapy: Secondary | ICD-10-CM | POA: Diagnosis not present

## 2020-03-28 DIAGNOSIS — I10 Essential (primary) hypertension: Secondary | ICD-10-CM

## 2020-03-28 DIAGNOSIS — R05 Cough: Secondary | ICD-10-CM | POA: Diagnosis not present

## 2020-03-28 LAB — BASIC METABOLIC PANEL
Anion gap: 7 (ref 5–15)
BUN: 15 mg/dL (ref 8–23)
CO2: 24 mmol/L (ref 22–32)
Calcium: 9.4 mg/dL (ref 8.9–10.3)
Chloride: 112 mmol/L — ABNORMAL HIGH (ref 98–111)
Creatinine, Ser: 0.57 mg/dL (ref 0.44–1.00)
GFR calc Af Amer: >60 mL/min (ref >60)
GFR calc non Af Amer: >60 mL/min (ref >60)
Glucose, Bld: 101 mg/dL — ABNORMAL HIGH (ref 70–99)
Potassium: 3.8 mmol/L (ref 3.5–5.1)
Sodium: 143 mmol/L (ref 135–145)

## 2020-03-28 LAB — CBC WITH DIFFERENTIAL/PLATELET
Abs Immature Granulocytes: 0.01 10*3/uL (ref 0.00–0.07)
Basophils Absolute: 0.1 10*3/uL (ref 0.0–0.1)
Basophils Relative: 1 %
Eosinophils Absolute: 0.1 10*3/uL (ref 0.0–0.5)
Eosinophils Relative: 2 %
HCT: 39.5 % (ref 36.0–46.0)
Hemoglobin: 13.2 g/dL (ref 12.0–15.0)
Immature Granulocytes: 0 %
Lymphocytes Relative: 32 %
Lymphs Abs: 1.4 10*3/uL (ref 0.7–4.0)
MCH: 32 pg (ref 26.0–34.0)
MCHC: 33.4 g/dL (ref 30.0–36.0)
MCV: 95.6 fL (ref 80.0–100.0)
Monocytes Absolute: 0.4 10*3/uL (ref 0.1–1.0)
Monocytes Relative: 9 %
Neutro Abs: 2.5 10*3/uL (ref 1.7–7.7)
Neutrophils Relative %: 56 %
Platelets: 302 10*3/uL (ref 150–400)
RBC: 4.13 MIL/uL (ref 3.87–5.11)
RDW: 13 % (ref 11.5–15.5)
WBC: 4.4 10*3/uL (ref 4.0–10.5)
nRBC: 0 % (ref 0.0–0.2)

## 2020-03-28 LAB — TROPONIN I (HIGH SENSITIVITY)
Troponin I (High Sensitivity): 3 ng/L (ref <18)
Troponin I (High Sensitivity): 3 ng/L (ref <18)

## 2020-03-28 MED ORDER — NITROGLYCERIN 0.4 MG SL SUBL
0.4000 mg | SUBLINGUAL_TABLET | Freq: Once | SUBLINGUAL | Status: AC
  Administered 2020-03-28: 0.4 mg via SUBLINGUAL

## 2020-03-28 MED ORDER — NITROGLYCERIN 0.4 MG SL SUBL
0.4000 mg | SUBLINGUAL_TABLET | Freq: Once | SUBLINGUAL | Status: DC
  Filled 2020-03-28: qty 1

## 2020-03-28 NOTE — Progress Notes (Signed)
   Tanya [name]     MRN: [id number]      DOB: [birth year]   HPI Tanya [name] is here with a 3-day history of substernal discomfort which she states is aggravated by activity and last night was severe enough that she thought she would pass out.  She does have known coronary artery disease and recent studies showed  40% restenting of the mid LAD this study was done in July 2020. Tanya [name] states she felt so lightheaded this morning and had shortness of breath that she called in for an acute visit.  Complains of bruising with Plavix.  ROS Denies recent fever or chills. Denies sinus pressure, nasal congestion, ear pain or sore throat. Denies chest congestion, productive cough or wheezing.  Denies abdominal pain, nausea, vomiting,diarrhea or constipation.   Denies dysuria, frequency, hesitancy or incontinence. Denies joint pain, swelling and limitation in mobility. Denies headaches, seizures, numbness, or tingling. Denies depression, anxiety or insomnia.  PE  BP (!) 160/71 (BP Location: Right Arm, Patient Position: Sitting, Cuff Size: Normal)   Pulse 64   Temp (!) 97.2 F (36.2 C) (Temporal)   Ht 5\' 4"  (5.188 m)   Wt 136 lb (61.7 kg)   SpO2 96%   BMI 23.34 kg/m   Patient alert and oriented, blood pressure elevated  HEENT: No facial asymmetry, EOMI,     Neck supple .  Chest: Clear to auscultation bilaterally.  CVS: S1, S2 no murmurs, no S3.Regular rate. EKG: NSR, no LVH , no ischemic changes ABD: Soft non tender.   Ext: No edema  MS: Adequate ROM spine, shoulders, hips and knees.  Skin: Intact, no ulcerations or rash noted.  Psych: Good eye contact, normal affect. Memory intact not anxious or depressed appearing.  CNS: CN 2-12 intact, power,  normal throughout.no focal deficits noted.   Assessment & Plan  Chest pain on exertion 3 dya h/o intermittent substernal discomfort, known CAD, office EKG shows no acute ischemia, based on history , pt sent to ED for further  eval Of note she had a similar concern at her most recent visit to cardiology less than 2 months ago  Exertional dyspnea Reports increased shortness of breath with activity and poor exercise tolerance. ED to eval, will also refer to Pulmonary, as a recurrent complaint  Near syncope Reports near syncope on the job 1 day ago, ED eval with the constellation of symptoms presented  GERD (gastroesophageal reflux disease) May need re evaluation foruncontroled reflux as she describes both epigastricand substernal discomfort

## 2020-03-28 NOTE — ED Provider Notes (Signed)
[facility] EMERGENCY DEPARTMENT Provider Note   CSN: [id number] Arrival date & time: 03/28/20  1148     History Chief Complaint  Patient presents with  . Shortness of Breath    Tanya [name] is a 69 y.o. female w/ hx of CAD s/p 2 stents DES to proximal LAD in 2019, HTN, HLD, presenting emerge department chest pain.  The patient reports she has had exertional chest pain ('pressure") with shortness of breath for the past 3 to 4 days.  She was seen at her primary care doctor's office today and referred into the emergency department.  Patient reports that she has noticed for the past few days that she has a pressure sensation in her epigastrium and a tightness in her throat.  It is also associated with shortness of breath, worse when walking, better at rest.  She has had these same symptoms for many months, but never lasting so long. She denies lightheadedness, nausea vomiting or diaphoresis.  She currently has 2 out of 10 chest pressure intensity.  She reports she does suffer from acid reflux and believes the symptoms may be similar to that but she is not sure.  She does not smoke, drink etoh or use recreational drugs.  Last seen in cardiologist office on 02/23/20 by Levell July NP (Dr Bronson Ing is cardiologist), was noted to have short-lacting dyspnea on exertion at that time, felt to be stable for discharge with follow up in 1 year.  Last cath was 04/24/2019, impression:  40% eccentric mid LAD in-stent restenosis.  At completion of stent procedure in September 2020, there was residual stent deformity in this region.  There is no high-grade obstruction in the LAD stent.  The LAD is otherwise normal.  Normal left main  Normal circumflex  Normal RCA  Normal LV systolic function and end-diastolic pressure  Last echo 06/29/19:  1. Left ventricular ejection fraction, by visual estimation, is 55 to  60%. The left ventricle has normal function. There is no left ventricular   hypertrophy.  2. Left ventricular diastolic Doppler parameters are consistent with  impaired relaxation pattern of LV diastolic filling.  3. Global right ventricle has normal systolic function.The right  ventricular size is normal. No increase in right ventricular wall  thickness.  4. Left atrial size was normal.  5. Right atrial size was normal.  6. The mitral valve is normal in structure. Trace mitral valve  regurgitation. No evidence of mitral stenosis.  7. The tricuspid valve is normal in structure. Tricuspid valve  regurgitation was not visualized by color flow Doppler.  8. The aortic valve is tricuspid Aortic valve regurgitation was not  visualized by color flow Doppler. Mild aortic valve sclerosis without  stenosis.  9. The pulmonic valve was not well visualized. Pulmonic valve  regurgitation is not visualized by color flow Doppler.  10. Normal pulmonary artery systolic pressure.  11. The inferior vena cava IVC is small, suggesting low RA pressure and  hypovolemia.      HPI     Past Medical History:  Diagnosis Date  . 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 Anhedonia   . Anxiety   . CAD (coronary artery disease)    a. s/p DESx2 to proximal LAD in 06/2018 b. cath in 04/2019 showing 40% mid-LAD ISR with no significant stenosis along LM, LCx, or RCA  . Cystitis   . Elevated liver enzymes   . Fatty liver   . GERD (gastroesophageal reflux disease)   .  Headache    migraine  . Hyperlipidemia   . Hypertension   . Insomnia   . Unspecified hypothyroidism   . Vaginitis   . Wears glasses   . Wears hearing aid    B/L    Patient Active Problem List   Diagnosis Date Noted  . Brain aneurysm 12/21/2019  . 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 Foamy urine 10/20/2019  . Sensory disturbance 06/29/2019  . Hospital discharge follow-up 06/21/2019  . Hypokalemia 06/15/2019  . Need for shingles vaccine 03/25/2019  . Educated about COVID-19 virus infection  02/15/2019  . CAD (coronary artery disease), native coronary artery 06/06/2018  . Headache 03/08/2018  . Primary osteoarthritis of both hands 02/13/2018  . Primary osteoarthritis of both knees 02/13/2018  . Anxiety 02/06/2017  . Pruritus 07/20/2016  . Arthritis 07/20/2016  . Tubular adenoma of colon 02/11/2016  . Coronary artery calcification seen on CAT scan - Moderate LAD lesion noted 11/16/2014  . Abnormal CT scan, chest 11/08/2014  . IGT (impaired glucose tolerance) 06/11/2014  . Osteopenia 01/27/2014  . Dyspnea 01/19/2014  . Microscopic hematuria 01/05/2013  . Urinary incontinence, mixed 01/05/2013  . GERD (gastroesophageal reflux disease) 12/16/2009  . Chest pain 03/01/2009  . Hyperlipemia 04/09/2008  . Essential hypertension 04/09/2008  . Insomnia disorder 04/09/2008    Past Surgical History:  Procedure Laterality Date  . CATARACT EXTRACTION W/PHACO Right 05/08/2019   Procedure: CATARACT EXTRACTION PHACO AND INTRAOCULAR LENS PLACEMENT (IOC);  Surgeon: Baruch Goldmann, MD;  Location: [facility];  Service: Ophthalmology;  Laterality: Right;  CDE: 10.02  . COLONOSCOPY N/A 02/08/2016   Procedure: COLONOSCOPY;  Surgeon: [name], MD;  Location: [facility];  Service: Endoscopy;  Laterality: N/A;  1200 - moved to 11:45 - Ann to notify  . CORONARY STENT INTERVENTION N/A 06/06/2018   Procedure: CORONARY STENT INTERVENTION;  Surgeon: [name], [name], MD;  Location: [place] [facility];  Service: Cardiovascular;  Laterality: N/A;  . INTRAVASCULAR PRESSURE WIRE/FFR STUDY N/A 06/06/2018   Procedure: INTRAVASCULAR PRESSURE WIRE/FFR STUDY;  Surgeon: [name], [name], MD;  Location: [place] [facility];  Service: Cardiovascular;  Laterality: N/A;  . IR ANGIO INTRA EXTRACRAN SEL COM CAROTID INNOMINATE BILAT MOD SED  09/01/2019  . IR ANGIO INTRA EXTRACRAN SEL INTERNAL CAROTID UNI L MOD SED  12/21/2019  . IR ANGIO VERTEBRAL SEL VERTEBRAL BILAT MOD SED  09/01/2019  . IR ANGIOGRAM FOLLOW UP STUDY   12/21/2019  . IR TRANSCATH/EMBOLIZ  12/21/2019  . LEFT HEART CATH AND CORONARY ANGIOGRAPHY N/A 04/24/2019   Procedure: LEFT HEART CATH AND CORONARY ANGIOGRAPHY;  Surgeon: Belva Crome, MD;  Location: [place] [facility];  Service: Cardiovascular;  Laterality: N/A;  . LEFT HEART CATHETERIZATION WITH CORONARY ANGIOGRAM N/A 11/19/2014   Procedure: LEFT HEART CATHETERIZATION WITH CORONARY ANGIOGRAM;  Surgeon: [name], MD;  Location: [facility] CATH LAB;  Service: Cardiovascular;  Laterality: N/A;  . RADIOLOGY WITH ANESTHESIA N/A 11/23/2019   Procedure: Drucilla Schmidt;  Surgeon: Luanne Bras, MD;  Location: [place];  Service: Radiology;  Laterality: N/A;  . RADIOLOGY WITH ANESTHESIA N/A 12/21/2019   Procedure: EMBOLIZATION;  Surgeon: Luanne Bras, MD;  Location: [place];  Service: Radiology;  Laterality: N/A;  . RIGHT/LEFT HEART CATH AND CORONARY ANGIOGRAPHY N/A 06/06/2018   Procedure: RIGHT/LEFT HEART CATH AND CORONARY ANGIOGRAPHY;  Surgeon: [name], [name], MD;  Location: [place] [facility];  Service: Cardiovascular;  Laterality: N/A;     OB History   No obstetric history  on file.     Family History  Problem Relation Age of Onset  . Dementia Mother   . Heart disease Mother        heart attack   . Hypertension Mother   . Heart attack Father   . Heart disease Father        heart attack   . Hypertension Father     Social History   Tobacco Use  . Smoking status: Never Smoker  . Smokeless tobacco: Never Used  Vaping Use  . Vaping Use: Never used  Substance Use Topics  . Alcohol use: No    Alcohol/week: 0.0 standard drinks  . Drug use: No    Home Medications Prior to Admission medications   Medication Sig Start Date End Date Taking? Authorizing Provider  acetaminophen (TYLENOL) 500 MG tablet Take 1,000 mg by mouth every 6 (six) hours as needed for moderate pain.   Yes [provider]  amLODipine (NORVASC) 2.5 MG tablet Take 1 tablet (2.5 mg total) by mouth daily.  10/26/19  Yes [name], MD  Ascorbic Acid (VITAMIN C) 1000 MG tablet Take 1,000 mg by mouth daily.    Yes [provider]  aspirin EC 81 MG tablet Take 81 mg by mouth at bedtime.   Yes [provider]  Biotin 5000 MCG CAPS Take 5,000 mcg by mouth daily.   Yes [provider]  Carboxymethylcellulose Sodium (THERATEARS OP) Place 1 drop into both eyes 2 (two) times daily.    Yes [provider]  cloNIDine (CATAPRES) 0.2 MG tablet TAKE 1 TABLET BY MOUTH AT  BEDTIME FOR BLOOD PRESSURE 11/23/19  Yes [name], MD  clopidogrel (PLAVIX) 75 MG tablet Take 1 tablet (75 mg total) by mouth daily. 09/23/19  Yes [name], MD  diphenhydrAMINE (BENADRYL) 25 MG tablet Take 50 mg by mouth daily as needed for itching.    Yes [provider]  metoprolol succinate (TOPROL XL) 25 MG 24 hr tablet Take 0.5 tablets (12.5 mg total) by mouth daily. 03/22/20  Yes Verta Ellen., NP  nitroGLYCERIN (NITROSTAT) 0.4 MG SL tablet Place 1 tablet (0.4 mg total) under the tongue every 5 (five) minutes as needed. Patient taking differently: Place 0.4 mg under the tongue every 5 (five) minutes x 3 doses as needed for chest pain.  06/06/18  Yes Cheryln Manly, NP  Omega-3 Fatty Acids (FISH OIL) 1000 MG CAPS Take 1,000 mg by mouth daily.   Yes [provider]  pantoprazole (PROTONIX) 40 MG tablet Take 1 tablet (40 mg total) by mouth daily. 09/23/19  Yes [name], MD  potassium chloride SA (KLOR-CON) 20 MEQ tablet Take 1 tablet (20 mEq total) by mouth daily. 12/23/19  Yes [name], MD  rosuvastatin (CRESTOR) 40 MG tablet TAKE 1 TABLET BY MOUTH  DAILY Patient taking differently: Take 40 mg by mouth daily.  11/19/19  Yes Perlie Mayo, NP  temazepam (RESTORIL) 15 MG capsule Take 1 capsule (15 mg total) by mouth at bedtime as needed for sleep. 01/25/20  Yes [name], MD  topiramate (TOPAMAX) 25 MG tablet Take 50 mg by  mouth at bedtime.  07/13/19  Yes [provider]    Allergies    Patient has no known allergies.  Review of Systems   Review of Systems  Constitutional: Negative for chills and fever.  HENT: Negative for ear pain and sore throat.   Eyes: Negative for pain and  visual disturbance.  Respiratory: Positive for shortness of breath. Negative for cough.   Cardiovascular: Positive for chest pain. Negative for palpitations.  Gastrointestinal: Negative for abdominal pain and vomiting.  Genitourinary: Negative for dysuria and hematuria.  Musculoskeletal: Negative for arthralgias and back pain.  Skin: Negative for color change and rash.  Neurological: Negative for syncope, light-headedness and headaches.  Psychiatric/Behavioral: Negative for agitation and confusion.  All other systems reviewed and are negative.   Physical Exam Updated Vital Signs BP (!) 142/67   Pulse (!) 55   Temp 97.9 F (36.6 C) (Oral)   Resp 20   Ht 5\' 4"  (1.626 m)   Wt 61.7 kg   SpO2 100%   BMI 23.34 kg/m   Physical Exam Vitals and nursing note reviewed.  Constitutional:      General: She is not in acute distress.    Appearance: She is well-developed.  HENT:     Head: Normocephalic and atraumatic.  Eyes:     Conjunctiva/sclera: Conjunctivae normal.  Cardiovascular:     Rate and Rhythm: Normal rate and regular rhythm.     Pulses: Normal pulses.  Pulmonary:     Effort: Pulmonary effort is normal. No respiratory distress.     Breath sounds: Normal breath sounds.  Abdominal:     Palpations: Abdomen is soft.     Tenderness: There is no abdominal tenderness.  Musculoskeletal:     Cervical back: Neck supple.  Skin:    General: Skin is warm and dry.  Neurological:     General: No focal deficit present.     Mental Status: She is alert and oriented to person, place, and time.  Psychiatric:        Mood and Affect: Mood normal.        Behavior: Behavior normal.     ED Results / Procedures /  Treatments   Labs (all labs ordered are listed, but only abnormal results are displayed) Labs Reviewed  BASIC METABOLIC PANEL - Abnormal; Notable for the following components:      Result Value   Chloride 112 (*)    Glucose, Bld 101 (*)    All other components within normal limits  CBC WITH DIFFERENTIAL/PLATELET  TROPONIN I (HIGH SENSITIVITY)  TROPONIN I (HIGH SENSITIVITY)    EKG EKG Interpretation  Date/Time:  Monday March 28 2020 12:31:16 EDT Ventricular Rate:  52 PR Interval:    QRS Duration: 94 QT Interval:  444 QTC Calculation: 413 R Axis:   78 Text Interpretation: Sinus rhythm No STEMI Confirmed by Octaviano Glow [phone]) on 03/28/2020 12:33:11 PM   Radiology DG Chest 2 View  Result Date: 03/28/2020 CLINICAL DATA:  Shortness of breath with exertion and dry cough for 3 days. EXAM: CHEST - 2 VIEW COMPARISON:  Single-view of the chest 06/29/2019. FINDINGS: Lungs clear. Heart size normal. No pneumothorax or pleural fluid. Aortic atherosclerosis and coronary artery stent is noted. No bony abnormality. IMPRESSION: No acute disease. Aortic Atherosclerosis (ICD10-I70.0). Electronically Signed   By: Inge Rise M.D.   On: 03/28/2020 12:47    Procedures Procedures (including critical care time)  Medications Ordered in ED Medications  nitroGLYCERIN (NITROSTAT) SL tablet 0.4 mg (0.4 mg Sublingual Given 03/28/20 1328)    ED Course  I have reviewed the triage vital signs and the nursing notes.  Pertinent labs & imaging results that were available during my care of the patient were reviewed by me and considered in my medical decision making (see chart for details).  [place]  yo female here with chest pressure and dyspnea on exertion, acute on chronic  Ddx includes reflux vs ACS vs PNA vs other   Pt is very well appearing on exam, comfortable in the bed Vitals wnl ECG shows normal sinus rhythm, no sign of ischemic Trop flat 3 -> 3, unremarkable Hgb wnl at 13.2,  WBC  4.4. Doubt anemia or infection at this time  SL nitro given here with no real change in symptoms.  Cardiologist Dr [name] consulted regarding her coronary history and recommendations for further testing, versus close outpatient management (given she has had these symptoms for so long).   He felt she was reasonable for discharge, and I agree.  Patient's medical records were reviewed including her cath and echo within the past year for similar symptoms, showing no significant CHF or new coronary occlusions.  This is also reassuring from a CAD perspective.  This may be reflux or pulmonary related.  Okay for discharge.  The patient wishes to go home, and will do so in the company of her husband. They are both aware that today's evaluation in the ER is not a definitive rule out for cardiac disease, and if there are new or concerning symptoms, she should return to the ER.      Clinical Course as of Mar 28 1840  Mon Mar 28, 2020  1449 I spoke to Dr [name] from cardiology who states he will come consult on patient and make recommendations regarding disposition.  Patient reports minimal change in CP with SL nitro.  Husband now at bedside.  They are aware of pending consult   [MT]  3212 Pt seen by dr branch who felt with it was reasonable to discharge her home with close office follow up - their office will call her.  Pt given return precautions. She is eager to go home and agrees with the plan.   [MT]    Clinical Course User Index [MT] Lavergne Hiltunen, Carola Rhine, MD   Final Clinical Impression(s) / ED Diagnoses Final diagnoses:  Shortness of breath  Chest pressure    Rx / DC Orders ED Discharge Orders    None       Langston Masker Carola Rhine, MD 03/28/20 [phone]

## 2020-03-28 NOTE — Consult Note (Signed)
Cardiology Consultation:   Patient ID: Tanya [name] MRN: [id number]; DOB: 03/20/1951  Admit date: 03/28/2020 Date of Consult: 03/28/2020  Primary Care Provider: [name], MD [facility] Cardiologist: [name], MD  [facility] Electrophysiologist:  None    Patient Profile:   Tanya [name] is a 69 y.o. female with a hx of CAD who is being seen today for the evaluation of chest pain/SO at the request of Dr . Langston Masker  History of Present Illness:   Ms. [name] 69 yo female history of CAD with prior DES x 2 to LAD in 06/2018, last cath 04/2019 with 40% mid LAD ISR, patent vessels. History of HTN, HL.    04/2019 at time of her last cath reported symptoms of chest pain and SOB similar to her prior angina. Was referred for cath at the time which was benign. Admitted 2 months later with chest pains and SOB, echo was benign at the time other than mild diastolic dysfunction. Trops negative at that time, Ddimer negative, normal BNP.   12/2019 cardiology clinic notes reports increasing DOE at that time, with some associated palpitations. Heart monitor did show some episodes of SVT  Presents with a report history of several months of chest heaviness and SOB similar to her chronic symptoms. Heaviness midchest, lasts up to 10 minutes. Not positional. Not related to food. DOE with activities    K 3.8 Cr 0.57 BUN 15  WBC 4.4 Hgb 13.2 Plt 302  hstrop 3--> EKG sinus brady, no ischemic changes CXR no acute process  06/2019 echo: LVEF 55-60%, grade I DDx, normal RV 04/2019 cath: 40% eccentric mid LAD ISR, patent vessels. LVEDP 10 01/2020 zio patch: SR, short bursts of SVT 2015 PFTs airtrapping, decreased DLCO   Past Medical History:  Diagnosis Date  . 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 Anhedonia   . Anxiety   . CAD (coronary artery disease)    a. s/p DESx2 to proximal LAD in 06/2018 b. cath in 04/2019 showing 40% mid-LAD ISR with no significant stenosis along LM,  LCx, or RCA  . Cystitis   . Elevated liver enzymes   . Fatty liver   . GERD (gastroesophageal reflux disease)   . Headache    migraine  . Hyperlipidemia   . Hypertension   . Insomnia   . Unspecified hypothyroidism   . Vaginitis   . Wears glasses   . Wears hearing aid    B/L    Past Surgical History:  Procedure Laterality Date  . CATARACT EXTRACTION W/PHACO Right 05/08/2019   Procedure: CATARACT EXTRACTION PHACO AND INTRAOCULAR LENS PLACEMENT (IOC);  Surgeon: Baruch Goldmann, MD;  Location: [facility];  Service: Ophthalmology;  Laterality: Right;  CDE: 10.02  . COLONOSCOPY N/A 02/08/2016   Procedure: COLONOSCOPY;  Surgeon: [name], MD;  Location: [facility];  Service: Endoscopy;  Laterality: N/A;  1200 - moved to 11:45 - Ann to notify  . CORONARY STENT INTERVENTION N/A 06/06/2018   Procedure: CORONARY STENT INTERVENTION;  Surgeon: [name], [name], MD;  Location: [place] CV LAB;  Service: Cardiovascular;  Laterality: N/A;  . INTRAVASCULAR PRESSURE WIRE/FFR STUDY N/A 06/06/2018   Procedure: INTRAVASCULAR PRESSURE WIRE/FFR STUDY;  Surgeon: [name], [name], MD;  Location: [place] [facility] LAB;  Service: Cardiovascular;  Laterality: N/A;  . IR ANGIO INTRA EXTRACRAN SEL COM CAROTID INNOMINATE BILAT MOD SED  09/01/2019  . IR ANGIO INTRA EXTRACRAN SEL INTERNAL CAROTID UNI L MOD SED  12/21/2019  . IR ANGIO VERTEBRAL SEL VERTEBRAL BILAT MOD SED  09/01/2019  . IR ANGIOGRAM FOLLOW UP STUDY  12/21/2019  . IR TRANSCATH/EMBOLIZ  12/21/2019  . LEFT HEART CATH AND CORONARY ANGIOGRAPHY N/A 04/24/2019   Procedure: LEFT HEART CATH AND CORONARY ANGIOGRAPHY;  Surgeon: Belva Crome, MD;  Location: [place] [facility];  Service: Cardiovascular;  Laterality: N/A;  . LEFT HEART CATHETERIZATION WITH CORONARY ANGIOGRAM N/A 11/19/2014   Procedure: LEFT HEART CATHETERIZATION WITH CORONARY ANGIOGRAM;  Surgeon: [name], MD;  Location: [facility] C[facility] LAB;  Service: Cardiovascular;  Laterality: N/A;  . RADIOLOGY WITH  ANESTHESIA N/A 11/23/2019   Procedure: Drucilla Schmidt;  Surgeon: Luanne Bras, MD;  Location: [place];  Service: Radiology;  Laterality: N/A;  . RADIOLOGY WITH ANESTHESIA N/A 12/21/2019   Procedure: EMBOLIZATION;  Surgeon: Luanne Bras, MD;  Location: [place];  Service: Radiology;  Laterality: N/A;  . RIGHT/LEFT HEART CATH AND CORONARY ANGIOGRAPHY N/A 06/06/2018   Procedure: RIGHT/LEFT HEART CATH AND CORONARY ANGIOGRAPHY;  Surgeon: [name], [name], MD;  Location: [place] CV LAB;  Service: Cardiovascular;  Laterality: N/A;       Inpatient Medications: Scheduled Meds:  Continuous Infusions:  PRN Meds:   Allergies:   No Known Allergies  Social History:   Social History   Socioeconomic History  . Marital status: Married    Spouse name: Not on file  . Number of children: Not on file  . Years of education: Not on file  . Highest education level: Not on file  Occupational History  . Not on file  Tobacco Use  . Smoking status: Never Smoker  . Smokeless tobacco: Never Used  Vaping Use  . Vaping Use: Never used  Substance and Sexual Activity  . Alcohol use: No    Alcohol/week: 0.0 standard drinks  . Drug use: No  . Sexual activity: Not on file  Other Topics Concern  . Not on file  Social History Narrative   Pt originally from the [place] been in [place] for 20 years     Social Determinants of Radio broadcast assistant Strain:   . Difficulty of Paying Living Expenses:   Food Insecurity:   . Worried About Charity fundraiser in the Last Year:   . Arboriculturist in the Last Year:   Transportation Needs:   . Film/video editor (Medical):   Marland Kitchen Lack of Transportation (Non-Medical):   Physical Activity:   . Days of Exercise per Week:   . Minutes of Exercise per Session:   Stress:   . Feeling of Stress :   Social Connections:   . Frequency of Communication with Friends and Family:   . Frequency of Social Gatherings with Friends and Family:   . Attends  Religious Services:   . Active Member of Clubs or Organizations:   . Attends Archivist Meetings:   Marland Kitchen Marital Status:   Intimate Partner Violence:   . Fear of Current or Ex-Partner:   . Emotionally Abused:   Marland Kitchen Physically Abused:   . Sexually Abused:     Family History:    Family History  Problem Relation Age of Onset  . Dementia Mother   . Heart disease Mother        heart attack   . Hypertension Mother   . Heart attack Father   . Heart disease Father        heart attack   . Hypertension Father  ROS:  Please see the history of present illness.   All other ROS reviewed and negative.     Physical Exam/Data:   Vitals:   03/28/20 1330 03/28/20 1337 03/28/20 1400 03/28/20 1430  BP: (!) 144/75 125/76 137/72 140/72  Pulse: (!) 47 (!) 48 (!) 42 (!) 46  Resp: 15 15 13 17   Temp:      TempSrc:      SpO2: 100% 100% 98% 100%  Weight:      Height:       No intake or output data in the 24 hours ending 03/28/20 1518 Last 3 Weights 03/28/2020 03/28/2020 02/23/2020  Weight (lbs) 136 lb 136 lb 134 lb 6.4 oz  Weight (kg) 61.689 kg 61.689 kg 60.963 kg     Body mass index is 23.34 kg/m.  General:  Well nourished, well developed, in no acute distress HEENT: normal Lymph: no adenopathy Neck: no JVD Endocrine:  No thryomegaly Vascular: No carotid bruits; FA pulses 2+ bilaterally without bruits  Cardiac:  normal S1, S2; RRR; no murmur  Lungs:  clear to auscultation bilaterally, no wheezing, rhonchi or rales  Abd: soft, nontender, no hepatomegaly  Ext: no edema Musculoskeletal:  No deformities, BUE and BLE strength normal and equal Skin: warm and dry  Neuro:  CNs 2-12 intact, no focal abnormalities noted Psych:  Normal affect     Laboratory Data:  High Sensitivity Troponin:   Recent Labs  Lab 03/28/20 1323  TROPONINIHS 3     Chemistry Recent Labs  Lab 03/28/20 1323  NA 143  K 3.8  CL 112*  CO2 24  GLUCOSE 101*  BUN 15  CREATININE 0.57  CALCIUM 9.4    GFRNONAA >60  GFRAA >60  ANIONGAP 7    No results for input(s): PROT, ALBUMIN, AST, ALT, ALKPHOS, BILITOT in the last 168 hours. Hematology Recent Labs  Lab 03/28/20 1323  WBC 4.4  RBC 4.13  HGB 13.2  HCT 39.5  MCV 95.6  MCH 32.0  MCHC 33.4  RDW 13.0  PLT 302   BNPNo results for input(s): BNP, PROBNP in the last 168 hours.  DDimer No results for input(s): DDIMER in the last 168 hours.   Radiology/Studies:  DG Chest 2 View  Result Date: 03/28/2020 CLINICAL DATA:  Shortness of breath with exertion and dry cough for 3 days. EXAM: CHEST - 2 VIEW COMPARISON:  Single-view of the chest 06/29/2019. FINDINGS: Lungs clear. Heart size normal. No pneumothorax or pleural fluid. Aortic atherosclerosis and coronary artery stent is noted. No bony abnormality. IMPRESSION: No acute disease. Aortic Atherosclerosis (ICD10-I70.0). Electronically Signed   By: Inge Rise M.D.   On: 03/28/2020 12:47   {    Assessment and Plan:   1. CAD - chronic chest pain/DOE at least over the last year. Extensive cardiac testing and mulitple hospital evaluations have been benign. Cath and echo within the year for similar symptoms benign. EKG and enzymes are benign here, if delta trop negative would be ok for discharge from ER - chronic SOB/DOE without a cardiac etiology from extensive testing over the past year. She did have abnormal PFTs in 2015, she reports she never saw a pulmonologist. We will refer     For questions or updates, please contact [place] Please consult [email or web address] for contact info under    Signed, [name], MD  03/28/2020 3:18 PM

## 2020-03-28 NOTE — ED Notes (Signed)
PT states her feeling of SOB was not relieved with the nitroglycerin SL tab.

## 2020-03-28 NOTE — Discharge Instructions (Addendum)
The cardiologist office should call you tomorrow to arrange follow up.

## 2020-03-28 NOTE — Patient Instructions (Addendum)
You need to go to the ED for further evaluation when you leave here, I will speak with the Dr in The ED about you.  Your EKG shows no current heart damage, BUT your history is concerning and we do know that you have heart disease   Keep August appointment please  Thanks for choosing [facility], we consider it a privelige to serve you.

## 2020-03-28 NOTE — ED Triage Notes (Addendum)
PT c/o SOB with exertion, congested non-productive cough x3 days. PT states she had both of the covid vaccine doses. PT states she was seen this am at Dr. [name] Dakin office and told to come to the ED for eval.

## 2020-03-29 ENCOUNTER — Telehealth: Payer: Self-pay | Admitting: Family Medicine

## 2020-03-29 DIAGNOSIS — R079 Chest pain, unspecified: Secondary | ICD-10-CM | POA: Insufficient documentation

## 2020-03-29 NOTE — Telephone Encounter (Signed)
Pls let pt know that I reviewed her ED visit,and I have referred her to lung specialist in [place] to evaluate her c/o shortness of breath, andrecommend she go. Has no ED follow up appyt here , needs one also as may be referred to GI as well, pls arrange for next 2 to 3  weeks with me, thanks

## 2020-03-29 NOTE — Assessment & Plan Note (Signed)
May need re evaluation foruncontroled reflux as she describes both epigastricand substernal discomfort

## 2020-03-29 NOTE — Assessment & Plan Note (Signed)
3 dya h/o intermittent substernal discomfort, known CAD, office EKG shows no acute ischemia, based on history , pt sent to ED for further eval Of note she had a similar concern at her most recent visit to cardiology less than 2 months ago

## 2020-03-29 NOTE — Assessment & Plan Note (Signed)
Elevated at visit, will re evaluate at next visit, pt sent to ED for evaluation

## 2020-03-29 NOTE — Assessment & Plan Note (Signed)
Reports increased shortness of breath with activity and poor exercise tolerance. ED to eval, will also refer to Pulmonary, as a recurrent complaint

## 2020-03-29 NOTE — Assessment & Plan Note (Signed)
Reports near syncope on the job 1 day ago, ED eval with the constellation of symptoms presented

## 2020-03-30 ENCOUNTER — Telehealth: Payer: Self-pay | Admitting: *Deleted

## 2020-03-30 DIAGNOSIS — R0602 Shortness of breath: Secondary | ICD-10-CM

## 2020-03-30 NOTE — Telephone Encounter (Signed)
Follow up ED appt was scheduled for July 14 at [name]. Husband scheduled the appt however patient was in the background. Reviewed with them that a referral has been placed with the lung specialist and it is important she goes. They will call her for an appointment when the referral has been worked. All with verbal understanding.

## 2020-03-30 NOTE — Telephone Encounter (Signed)
-----   Message from Arnoldo Lenis, MD sent at 03/28/2020  3:51 PM EDT ----- Can we get this pt ER f/u with Dr Bronson Ing 3 weeks,  also refer her to [place] pulmonary for SOB and abnormal PFTs   Zandra Abts MD

## 2020-03-30 NOTE — Telephone Encounter (Signed)
Left message for patient to call office back.

## 2020-03-31 ENCOUNTER — Other Ambulatory Visit: Payer: Self-pay

## 2020-03-31 MED ORDER — AMLODIPINE BESYLATE 2.5 MG PO TABS: 2.5000 mg | ORAL_TABLET | Freq: Every day | ORAL | 1 refills | Status: DC

## 2020-04-08 ENCOUNTER — Other Ambulatory Visit: Payer: Self-pay | Admitting: *Deleted

## 2020-04-08 MED ORDER — POTASSIUM CHLORIDE CRYS ER 20 MEQ PO TBCR: 20.0000 meq | EXTENDED_RELEASE_TABLET | Freq: Every day | ORAL | 3 refills | Status: DC

## 2020-04-12 DIAGNOSIS — G43701 Chronic migraine without aura, not intractable, with status migrainosus: Secondary | ICD-10-CM | POA: Diagnosis not present

## 2020-04-12 DIAGNOSIS — M542 Cervicalgia: Secondary | ICD-10-CM | POA: Diagnosis not present

## 2020-04-12 DIAGNOSIS — G4701 Insomnia due to medical condition: Secondary | ICD-10-CM | POA: Diagnosis not present

## 2020-04-12 DIAGNOSIS — G500 Trigeminal neuralgia: Secondary | ICD-10-CM | POA: Diagnosis not present

## 2020-04-13 ENCOUNTER — Other Ambulatory Visit: Payer: Self-pay

## 2020-04-13 ENCOUNTER — Ambulatory Visit (INDEPENDENT_AMBULATORY_CARE_PROVIDER_SITE_OTHER): Payer: Medicare Other | Admitting: Family Medicine

## 2020-04-13 ENCOUNTER — Encounter: Payer: Self-pay | Admitting: Family Medicine

## 2020-04-13 VITALS — BP 150/90 | HR 65 | Resp 16 | Ht 64.0 in | Wt 136.0 lb

## 2020-04-13 DIAGNOSIS — K219 Gastro-esophageal reflux disease without esophagitis: Secondary | ICD-10-CM | POA: Diagnosis not present

## 2020-04-13 DIAGNOSIS — F419 Anxiety disorder, unspecified: Secondary | ICD-10-CM | POA: Diagnosis not present

## 2020-04-13 DIAGNOSIS — Z09 Encounter for follow-up examination after completed treatment for conditions other than malignant neoplasm: Secondary | ICD-10-CM

## 2020-04-13 DIAGNOSIS — E782 Mixed hyperlipidemia: Secondary | ICD-10-CM

## 2020-04-13 DIAGNOSIS — I10 Essential (primary) hypertension: Secondary | ICD-10-CM | POA: Diagnosis not present

## 2020-04-13 MED ORDER — PAROXETINE HCL 10 MG PO TABS
10.0000 mg | ORAL_TABLET | Freq: Every day | ORAL | 3 refills | Status: DC
Start: 2020-04-13 — End: 2020-08-15

## 2020-04-13 MED ORDER — AMLODIPINE BESYLATE 5 MG PO TABS
5.0000 mg | ORAL_TABLET | Freq: Every day | ORAL | 3 refills | Status: DC
Start: 2020-04-13 — End: 2020-07-19

## 2020-04-13 NOTE — Patient Instructions (Addendum)
Keep August appointment as before  You are referred to Lung specialist re shortness of breath, we will give you appointment information asap ( sent from 06/27, please follow through)  Blood pressure is high, stop amlodipine 2.5 mg tablet, start new amlodipine 5 mg one daily  New for anxiety is paxil one daily, also work on the things which bother you, as best able  Thanks for choosing [facility], we consider it a privelige to serve you.

## 2020-04-15 ENCOUNTER — Ambulatory Visit: Payer: PRIVATE HEALTH INSURANCE | Admitting: Student

## 2020-04-15 ENCOUNTER — Encounter: Payer: Self-pay | Admitting: Student

## 2020-04-15 ENCOUNTER — Ambulatory Visit (INDEPENDENT_AMBULATORY_CARE_PROVIDER_SITE_OTHER): Payer: Medicare Other | Admitting: Student

## 2020-04-15 ENCOUNTER — Other Ambulatory Visit: Payer: Self-pay

## 2020-04-15 VITALS — BP 140/76 | HR 70 | Ht 64.0 in | Wt 135.0 lb

## 2020-04-15 DIAGNOSIS — R0600 Dyspnea, unspecified: Secondary | ICD-10-CM

## 2020-04-15 DIAGNOSIS — I10 Essential (primary) hypertension: Secondary | ICD-10-CM | POA: Diagnosis not present

## 2020-04-15 DIAGNOSIS — I25118 Atherosclerotic heart disease of native coronary artery with other forms of angina pectoris: Secondary | ICD-10-CM

## 2020-04-15 DIAGNOSIS — R0609 Other forms of dyspnea: Secondary | ICD-10-CM

## 2020-04-15 DIAGNOSIS — R942 Abnormal results of pulmonary function studies: Secondary | ICD-10-CM

## 2020-04-15 DIAGNOSIS — E785 Hyperlipidemia, unspecified: Secondary | ICD-10-CM

## 2020-04-15 MED ORDER — METOPROLOL SUCCINATE ER 25 MG PO TB24: 25.0000 mg | ORAL_TABLET | Freq: Every day | ORAL | 1 refills | Status: DC

## 2020-04-15 NOTE — Patient Instructions (Signed)
Medication Instructions:  INCREASE TOPROL TO 25 MG DAILY (TAKE A WHOLE TABLET)  CALL us BACK AFTER 1 WEEK TO LET us KNOW HOW YOU ARE DOING   *If you need a refill on your cardiac medications before your next appointment, please call your pharmacy*   Lab Work: NONE TODAY If you have labs (blood work) drawn today and your tests are completely normal, you will receive your results only by: Marland Kitchen MyChart Message (if you have MyChart) OR . A paper copy in the mail If you have any lab test that is abnormal or we need to change your treatment, we will call you to review the results.   Testing/Procedures: NONE TODAY   Follow-Up: At [facility], you and your health needs are our priority.  As part of our continuing mission to provide you with exceptional heart care, we have created designated Provider Care Teams.  These Care Teams include your primary Cardiologist (physician) and Advanced Practice Providers (APPs -  Physician Assistants and Nurse Practitioners) who all work together to provide you with the care you need, when you need it.  We recommend signing up for the patient portal called "MyChart".  Sign up information is provided on this After Visit Summary.  MyChart is used to connect with patients for Virtual Visits (Telemedicine).  Patients are able to view lab/test results, encounter notes, upcoming appointments, etc.  Non-urgent messages can be sent to your provider as well.   To learn more about what you can do with MyChart, go to [email or web address].    Your next appointment:   3 month(s)  The format for your next appointment:   In Person  Provider:   [facility] OR Maude Leriche, PA-C     Other Instructions  [place] [facility] [phone]      Thank you for choosing [facility] !

## 2020-04-15 NOTE — Progress Notes (Signed)
Cardiology Office Note    Date:  04/15/2020   ID:  Tanya [name], DOB 1951/03/07, MRN [id number]  PCP:  [name], MD  Cardiologist: [name], MD    Chief Complaint  Patient presents with   Follow-up    recent Emergency Dept visit    History of Present Illness:    Tanya [name] is a 69 y.o. female with past medical history of CAD (s/p DESx2 to proximal LAD in 06/2018 with cath in 04/2019 showing 40% mid-LAD ISR with no significant stenosis along LM, LCx, or RCA), HTN, HLD, and GERD who presents to the office today for follow-up from her recent Emergency Department visit.   She recently presented to [facility] ED on 03/28/2020 for evaluation of chest pain and dyspnea which resembled her prior symptoms. Episodes were not necessarily associated with exertion or positional changes. HS Troponin values were negative and her EKG showed no acute ischemic changes. Dr. [name] evaluated the patient and given her extensive cardiac testing within the past year and no objective evidence of ischemia, further cardiac testing was not recommended at that time. She previously had abnormal PFT's in 2015 which showed air-trapping and decreased DLCO and she never followed-up with Pulmonology at that time. Therefore, a Pulmonology referral was recommended.   In talking with the patient today, she reports having dyspnea on a daily basis which can occur at rest or with activity. Denies any specific orthopnea, PND or edema. She denies any specific chest pain but says she does feel a fullness along her chest which occurs along her entire precordial region and can occur at rest or with activity. She does report intermittent dizziness which is not associated with positional changes. Reports her PCP recently increased her Amlodipine from 2.5 mg daily to 5 mg daily given elevated BP.  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 s/p DESx2 to proximal LAD in 06/2018 b. cath in 04/2019 showing 40% mid-LAD ISR with no significant stenosis along LM, LCx, or RCA   Cystitis    Elevated liver enzymes    Fatty liver    GERD (gastroesophageal reflux disease)    Headache    migraine   Hyperlipidemia    Hypertension    Insomnia    Unspecified hypothyroidism    Vaginitis    Wears glasses    Wears hearing aid    B/L    Past Surgical History:  Procedure Laterality Date   CATARACT EXTRACTION W/PHACO Right 05/08/2019   Procedure: CATARACT EXTRACTION PHACO AND INTRAOCULAR LENS PLACEMENT ([place]);  Surgeon: Baruch Goldmann, MD;  Location: [facility];  Service: Ophthalmology;  Laterality: Right;  CDE: 10.02   COLONOSCOPY N/A 02/08/2016   Procedure: COLONOSCOPY;  Surgeon: [name], MD;  Location: [facility] ENDO SUITE;  Service: Endoscopy;  Laterality: N/A;  1200 - moved to 11:45 - Ann to notify   CORONARY STENT INTERVENTION N/A 06/06/2018   Procedure: CORONARY STENT INTERVENTION;  Surgeon: [name], [name], MD;  Location: [place] [facility] LAB;  Service: Cardiovascular;  Laterality: N/A;   INTRAVASCULAR PRESSURE WIRE/FFR STUDY N/A 06/06/2018   Procedure: INTRAVASCULAR PRESSURE WIRE/FFR STUDY;  Surgeon: [name], [name], MD;  Location: [place] [facility];  Service: Cardiovascular;  Laterality: N/A;   IR ANGIO INTRA EXTRACRAN SEL COM CAROTID INNOMINATE BILAT MOD SED  09/01/2019  IR ANGIO INTRA EXTRACRAN SEL INTERNAL CAROTID UNI L MOD SED  12/21/2019   IR ANGIO VERTEBRAL SEL VERTEBRAL BILAT MOD SED  09/01/2019   IR ANGIOGRAM FOLLOW UP STUDY  12/21/2019   IR TRANSCATH/EMBOLIZ  12/21/2019   LEFT HEART CATH AND CORONARY ANGIOGRAPHY N/A 04/24/2019   Procedure: LEFT HEART CATH AND CORONARY ANGIOGRAPHY;  Surgeon: Belva Crome, MD;  Location: [place] [facility];  Service: Cardiovascular;  Laterality: N/A;   LEFT HEART CATHETERIZATION WITH CORONARY ANGIOGRAM N/A 11/19/2014   Procedure: LEFT HEART  CATHETERIZATION WITH CORONARY ANGIOGRAM;  Surgeon: [name], MD;  Location: [facility] CATH LAB;  Service: Cardiovascular;  Laterality: N/A;   RADIOLOGY WITH ANESTHESIA N/A 11/23/2019   Procedure: Drucilla Schmidt;  Surgeon: Luanne Bras, MD;  Location: [place];  Service: Radiology;  Laterality: N/A;   RADIOLOGY WITH ANESTHESIA N/A 12/21/2019   Procedure: EMBOLIZATION;  Surgeon: Luanne Bras, MD;  Location: [place];  Service: Radiology;  Laterality: N/A;   RIGHT/LEFT HEART CATH AND CORONARY ANGIOGRAPHY N/A 06/06/2018   Procedure: RIGHT/LEFT HEART CATH AND CORONARY ANGIOGRAPHY;  Surgeon: [name], [name], MD;  Location: [place] CV LAB;  Service: Cardiovascular;  Laterality: N/A;    Current Medications: Outpatient Medications Prior to Visit  Medication Sig Dispense Refill   acetaminophen (TYLENOL) 500 MG tablet Take 1,000 mg by mouth every 6 (six) hours as needed for moderate pain.     amLODipine (NORVASC) 5 MG tablet Take 1 tablet (5 mg total) by mouth daily. 30 tablet 3   Ascorbic Acid (VITAMIN C) 1000 MG tablet Take 1,000 mg by mouth daily.      aspirin EC 81 MG tablet Take 81 mg by mouth at bedtime.     Biotin 5000 MCG CAPS Take 5,000 mcg by mouth daily.     Carboxymethylcellulose Sodium (THERATEARS OP) Place 1 drop into both eyes 2 (two) times daily.      cloNIDine (CATAPRES) 0.2 MG tablet TAKE 1 TABLET BY MOUTH AT  BEDTIME FOR BLOOD PRESSURE 90 tablet 1   clopidogrel (PLAVIX) 75 MG tablet Take 1 tablet (75 mg total) by mouth daily. 90 tablet 3   diphenhydrAMINE (BENADRYL) 25 MG tablet Take 50 mg by mouth daily as needed for itching.      nitroGLYCERIN (NITROSTAT) 0.4 MG SL tablet Place 1 tablet (0.4 mg total) under the tongue every 5 (five) minutes as needed. (Patient taking differently: Place 0.4 mg under the tongue every 5 (five) minutes x 3 doses as needed for chest pain. ) 25 tablet 2   Omega-3 Fatty Acids (FISH OIL) 1000 MG CAPS Take 1,000 mg by mouth daily.      pantoprazole (PROTONIX) 40 MG tablet Take 1 tablet (40 mg total) by mouth daily. 90 tablet 3   PARoxetine (PAXIL) 10 MG tablet Take 1 tablet (10 mg total) by mouth daily. 30 tablet 3   potassium chloride SA (KLOR-CON) 20 MEQ tablet Take 1 tablet (20 mEq total) by mouth daily. 30 tablet 3   rosuvastatin (CRESTOR) 40 MG tablet TAKE 1 TABLET BY MOUTH  DAILY (Patient taking differently: Take 40 mg by mouth daily. ) 90 tablet 3   temazepam (RESTORIL) 15 MG capsule Take 1 capsule (15 mg total) by mouth at bedtime as needed for sleep. 30 capsule 4   topiramate (TOPAMAX) 25 MG tablet Take 50 mg by mouth at bedtime.      metoprolol succinate (TOPROL XL) 25 MG 24 hr tablet Take 0.5 tablets (12.5 mg total) by mouth  daily. 45 tablet 1   No facility-administered medications prior to visit.     Allergies:   Patient has no known allergies.   Social History   Socioeconomic History   Marital status: Married    Spouse name: Not on file   Number of children: Not on file   Years of education: Not on file   Highest education level: Not on file  Occupational History   Not on file  Tobacco Use   Smoking status: Never Smoker   Smokeless tobacco: Never Used  Vaping Use   Vaping Use: Never used  Substance and Sexual Activity   Alcohol use: No    Alcohol/week: 0.0 standard drinks   Drug use: No   Sexual activity: Not on file  Other Topics Concern   Not on file  Social History Narrative   Pt originally from the [place] been in [place] for 20 years     Social Determinants of Radio broadcast assistant Strain:    Difficulty of Paying Living Expenses:   Food Insecurity:    Worried About Charity fundraiser in the Last Year:    Arboriculturist in the Last Year:   Transportation Needs:    Film/video editor (Medical):    Lack of Transportation (Non-Medical):   Physical Activity:    Days of Exercise per Week:    Minutes of Exercise per Session:   Stress:    Feeling of  Stress :   Social Connections:    Frequency of Communication with Friends and Family:    Frequency of Social Gatherings with Friends and Family:    Attends Religious Services:    Active Member of Clubs or Organizations:    Attends Music therapist:    Marital Status:      Family History:  The patient's family history includes Dementia in her mother; Heart attack in her father; Heart disease in her father and mother; Hypertension in her father and mother.   Review of Systems:   Please see the history of present illness.     General:  No chills, fever, night sweats or weight changes.  Cardiovascular:  No edema, orthopnea, palpitations, paroxysmal nocturnal dyspnea. Positive for chest pain and dyspnea on exertion.  Dermatological: No rash, lesions/masses Respiratory: No cough, Positive for dyspnea. Urologic: No hematuria, dysuria Abdominal:   No nausea, vomiting, diarrhea, bright red blood per rectum, melena, or hematemesis Neurologic:  No visual changes, wkns, changes in mental status. All other systems reviewed and are otherwise negative except as noted above.   Physical Exam:    VS:  BP 140/76    Pulse 70    Ht 5\' 4"  (1.626 m)    Wt 135 lb (61.2 kg)    SpO2 98%    BMI 23.17 kg/m    General: Well developed, well nourished,female appearing in no acute distress. Head: Normocephalic, atraumatic, sclera non-icteric.  Neck: No carotid bruits. JVD not elevated.  Lungs: Respirations regular and unlabored, without wheezes or rales.  Heart: Regular rate and rhythm. No S3 or S4.  No murmur, no rubs, or gallops appreciated. Abdomen: Soft, non-tender, non-distended. No obvious abdominal masses. Msk:  Strength and tone appear normal for age. No obvious joint deformities or effusions. Extremities: No clubbing or cyanosis. No edema.  Distal pedal pulses are 2+ bilaterally. Neuro: Alert and oriented X 3. Moves all extremities spontaneously. No focal deficits noted. Psych:   Responds to questions appropriately. Tearful during encounter at times  as she reports being anxious about her symptoms.  Skin: No rashes or lesions noted  Wt Readings from Last 3 Encounters:  04/15/20 135 lb (61.2 kg)  04/13/20 136 lb (61.7 kg)  03/28/20 136 lb (61.7 kg)      Studies/Labs Reviewed:   EKG:  EKG is not ordered today. EKG from 03/28/2020 is reviewed which shows sinus bradycardia, HR 52 with no acute ST abnormalities.   Recent Labs: 06/29/2019: B Natriuretic Peptide 14.0 06/30/2019: Magnesium 2.2 01/27/2020: ALT 13 03/28/2020: BUN 15; Creatinine, Ser 0.57; Hemoglobin 13.2; Platelets 302; Potassium 3.8; Sodium 143   Lipid Panel    Component Value Date/Time   CHOL 120 01/27/2020 1022   TRIG 80 01/27/2020 1022   HDL 60 01/27/2020 1022   CHOLHDL 2.0 01/27/2020 1022   VLDL 11 06/29/2019 0946   LDLCALC 44 01/27/2020 1022    Additional studies/ records that were reviewed today include:   Cardiac Catheterization: 04/2019  40% eccentric mid LAD in-stent restenosis.  At completion of stent procedure in September 2020, there was residual stent deformity in this region.  There is no high-grade obstruction in the LAD stent.  The LAD is otherwise normal.  Normal left main  Normal circumflex  Normal RCA  Normal LV systolic function and end-diastolic pressure  RECOMMENDATIONS:   Dyspnea cannot be explained by findings on today's study based on absence of significant coronary obstruction and normal LV hemodynamics.  Continue dual antiplatelet therapy beyond 12 months due to overlapping stents in the LAD.  Echocardiogram: 06/2019 IMPRESSIONS    1. Left ventricular ejection fraction, by visual estimation, is 55 to  60%. The left ventricle has normal function. There is no left ventricular  hypertrophy.  2. Left ventricular diastolic Doppler parameters are consistent with  impaired relaxation pattern of LV diastolic filling.  3. Global right ventricle has normal  systolic function.The right  ventricular size is normal. No increase in right ventricular wall  thickness.  4. Left atrial size was normal.  5. Right atrial size was normal.  6. The mitral valve is normal in structure. Trace mitral valve  regurgitation. No evidence of mitral stenosis.  7. The tricuspid valve is normal in structure. Tricuspid valve  regurgitation was not visualized by color flow Doppler.  8. The aortic valve is tricuspid Aortic valve regurgitation was not  visualized by color flow Doppler. Mild aortic valve sclerosis without  stenosis.  9. The pulmonic valve was not well visualized. Pulmonic valve  regurgitation is not visualized by color flow Doppler.  10. Normal pulmonary artery systolic pressure.  11. The inferior vena cava IVC is small, suggesting low RA pressure and  hypovolemia.   Long-term Monitor: 01/2020  Sinus rhythm with rare PACs and PVCs and short bursts of SVT (possibly atrial tachycardia).   Assessment:    1. Dyspnea on exertion   2. Abnormal PFT   3. Coronary artery disease involving native coronary artery of native heart with other form of angina pectoris ([place])   4. Essential hypertension   5. Hyperlipidemia LDL goal <70      Plan:   In order of problems listed above:  1. Dyspnea on Exertion - She reports having dyspnea on exertion and at rest which has been occurring for over the past year.  She has undergone multiple cardiac tests including a cardiac catheterization last July which showed patent stents. Echocardiogram showed a preserved EF with no wall motion normalities. Recent event monitor showed normal sinus rhythm with occasional PAC's and  PVC's and possible burst of atrial tach but nothing to account for symptoms. - Reviewed her work-up with the patient today and will try increasing Toprol-XL to 25 mg daily to see if this helps with her palpitations and dyspnea. She has been referred to Pulmonology by her PCP but has not heard back  from them. Will reenter the referral for Pulmonology today as she did have PFT's previously which showed air trapping and decreased DLCO.   2. CAD - She is s/p DESx2 to proximal LAD in 06/2018 with recent cath in 04/2019 showing 40% mid-LAD ISR with no significant stenosis along LM, LCx, or RCA. Her chest pain has resolved but she is still having dyspnea as outlined above. Will titrate Toprol-XL as above. If no improvement, can try Imdur 30mg  daily but she is hesitant to add new medications at this time. - Continue ASA, Plavix, BB and statin therapy.   3. HTN - BP is slightly elevated at 140/76 during today's visit. Given her reported dizziness, I did check orthostatics today which were negative. Continue Amlodipine 5 mg daily and Clonidine 0.2mg  at night.  Will titrate Toprol-XL as outlined above.  4. HLD - FLP in 12/2019 showed total cholesterol 120, HDL 60, triglycerides 80 and LDL 44.  Continue Crestor 40 mg daily as LDL is at goal of less than 70.  Medication Adjustments/Labs and Tests Ordered: Current medicines are reviewed at length with the patient today.  Concerns regarding medicines are outlined above.  Medication changes, Labs and Tests ordered today are listed in the Patient Instructions below. Patient Instructions  Medication Instructions:  INCREASE TOPROL TO 25 MG DAILY (TAKE A WHOLE TABLET)  CALL us BACK AFTER 1 WEEK TO LET us KNOW HOW YOU ARE DOING   *If you need a refill on your cardiac medications before your next appointment, please call your pharmacy*   Lab Work: NONE TODAY If you have labs (blood work) drawn today and your tests are completely normal, you will receive your results only by:  [place] (if you have MyChart) OR  A paper copy in the mail If you have any lab test that is abnormal or we need to change your treatment, we will call you to review the results.   Testing/Procedures: NONE TODAY   Follow-Up: At [facility], you and your health  needs are our priority.  As part of our continuing mission to provide you with exceptional heart care, we have created designated Provider Care Teams.  These Care Teams include your primary Cardiologist (physician) and Advanced Practice Providers (APPs -  Physician Assistants and Nurse Practitioners) who all work together to provide you with the care you need, when you need it.  We recommend signing up for the patient portal called "MyChart".  Sign up information is provided on this After Visit Summary.  MyChart is used to connect with patients for Virtual Visits (Telemedicine).  Patients are able to view lab/test results, encounter notes, upcoming appointments, etc.  Non-urgent messages can be sent to your provider as well.   To learn more about what you can do with MyChart, go to [email or web address].    Your next appointment:   3 month(s)  The format for your next appointment:   In Person  Provider:   [facility] OR Maude Leriche, PA-C   Other Instructions  [facility] [phone]  Thank you for choosing [facility] !        Signed, Erma Heritage, PA-C  04/15/2020 5:49  PM    [facility] [address] , [place] [address] Phone: [phone] Fax: [phone]

## 2020-04-17 ENCOUNTER — Encounter: Payer: Self-pay | Admitting: Family Medicine

## 2020-04-17 DIAGNOSIS — Z09 Encounter for follow-up examination after completed treatment for conditions other than malignant neoplasm: Secondary | ICD-10-CM | POA: Insufficient documentation

## 2020-04-17 NOTE — Assessment & Plan Note (Signed)
Uncontrolled , add amlodipine DASH diet and commitment to daily physical activity for a minimum of 30 minutes discussed and encouraged, as a part of hypertension management. The importance of attaining a healthy weight is also discussed.  BP/Weight 04/15/2020 04/13/2020 03/28/2020 03/28/2020 02/23/2020 01/19/2020 12/15/7423  Systolic BP 525 894 834 758 307 460 029  Diastolic BP 76 90 67 78 64 72 67  Wt. (Lbs) 135 136 136 136 134.4 - 139.2  BMI 23.17 23.34 23.34 23.34 23.07 - 23.89

## 2020-04-17 NOTE — Assessment & Plan Note (Signed)
Controlled, no change in medication  

## 2020-04-17 NOTE — Progress Notes (Signed)
   Tanya [name]     MRN: [id number]      DOB: 07-31-1951   HPI [name] is here for follow up of recent ED visit for shortness of breath, which she states is still present. Has upcoming pulmonary eval, cardiac cause ahs bene ruled out  States she worries all the time about her family in the [place] and wants to make sure that she financially supports them while alive and when she dies wants her possessions to go to them States she is seeking legal help for this but it worries her a lot, I advise her to discuss with her husband, she says he will be supportive  As he does not need , while her family is poor and in need. C/o poor sleep and generalized itching   ROS Denies recent fever or chills. Denies sinus pressure, nasal congestion, ear pain or sore throat. Denies chest congestion, productive cough or wheezing. Denies chest pains, palpitations and leg swelling Denies abdominal pain, nausea, vomiting,diarrhea or constipation.   Denies dysuria, frequency, hesitancy or incontinence. Denies joint pain, swelling and limitation in mobility. Denies headaches, seizures, numbness, or tingling.  Denies skin break down or rash.   PE  BP (!) 150/90   Pulse 65   Resp 16   Ht 5\' 4"  (1.626 m)   Wt 136 lb (61.7 kg)   SpO2 97%   BMI 23.34 kg/m   Patient alert and oriented and in no cardiopulmonary distress.  HEENT: No facial asymmetry, EOMI,     Neck supple .  Chest: Clear to auscultation bilaterally.  CVS: S1, S2 no murmurs, no S3.Regular rate.  ABD: Soft non tender.   Ext: No edema  MS: Adequate ROM spine, shoulders, hips and knees.  Skin: Intact, no ulcerations or rash noted.  Psych: Good eye contact, normal affect. Memory intact anxious not depressed appearing.  CNS: CN 2-12 intact, power,  normal throughout.no focal deficits noted.   Assessment & Plan  Encounter for examination following treatment at hospital Patient in for follow up of recent ED  visit Discharge summary, and laboratory and radiology data are reviewed, and any questions or concerns about recent visit are discussed. Specific issues requiring follow up are specifically addressed.   Essential hypertension Uncontrolled , add amlodipine DASH diet and commitment to daily physical activity for a minimum of 30 minutes discussed and encouraged, as a part of hypertension management. The importance of attaining a healthy weight is also discussed.  BP/Weight 04/15/2020 04/13/2020 03/28/2020 03/28/2020 02/23/2020 01/19/2020 01/07/8118  Systolic BP 147 829 562 130 865 784 696  Diastolic BP 76 90 67 78 64 72 67  Wt. (Lbs) 135 136 136 136 134.4 - 139.2  BMI 23.17 23.34 23.34 23.34 23.07 - 23.89       Anxiety Uncontrolled , encouraged to speak to spouse and also a Counsellor. Start Paxil daily, low dose  GERD (gastroesophageal reflux disease) Controlled, no change in medication   Hyperlipemia Hyperlipidemia:Low fat diet discussed and encouraged.   Lipid Panel  Lab Results  Component Value Date   CHOL 120 01/27/2020   HDL 60 01/27/2020   LDLCALC 44 01/27/2020   TRIG 80 01/27/2020   CHOLHDL 2.0 01/27/2020

## 2020-04-17 NOTE — Assessment & Plan Note (Signed)
Uncontrolled , encouraged to speak to spouse and also a Counsellor. Start Paxil daily, low dose

## 2020-04-17 NOTE — Assessment & Plan Note (Signed)
Hyperlipidemia:Low fat diet discussed and encouraged.   Lipid Panel  Lab Results  Component Value Date   CHOL 120 01/27/2020   HDL 60 01/27/2020   LDLCALC 44 01/27/2020   TRIG 80 01/27/2020   CHOLHDL 2.0 01/27/2020

## 2020-04-17 NOTE — Assessment & Plan Note (Signed)
Patient in for follow up of recent ED visit Discharge summary, and laboratory and radiology data are reviewed, and any questions or concerns about recent visit are discussed. Specific issues requiring follow up are specifically addressed.

## 2020-04-18 DIAGNOSIS — M79671 Pain in right foot: Secondary | ICD-10-CM | POA: Diagnosis not present

## 2020-04-18 DIAGNOSIS — M79674 Pain in right toe(s): Secondary | ICD-10-CM | POA: Diagnosis not present

## 2020-04-18 DIAGNOSIS — L110 Acquired keratosis follicularis: Secondary | ICD-10-CM | POA: Diagnosis not present

## 2020-04-18 DIAGNOSIS — I739 Peripheral vascular disease, unspecified: Secondary | ICD-10-CM | POA: Diagnosis not present

## 2020-04-18 DIAGNOSIS — H16221 Keratoconjunctivitis sicca, not specified as Sjogren's, right eye: Secondary | ICD-10-CM | POA: Diagnosis not present

## 2020-04-18 DIAGNOSIS — M79672 Pain in left foot: Secondary | ICD-10-CM | POA: Diagnosis not present

## 2020-04-21 ENCOUNTER — Telehealth: Payer: Self-pay | Admitting: Cardiology

## 2020-04-21 NOTE — Telephone Encounter (Signed)
Called spouse, got machine. I said I would FYI B.Strader, PA-C, his message.

## 2020-04-21 NOTE — Telephone Encounter (Signed)
Pt states recent medication changes are working.  Pt states she is not SOB and she feels better. Pt request to return call to her husband  Please call [phone]   Thanks renee

## 2020-05-19 ENCOUNTER — Encounter: Payer: Self-pay | Admitting: Family Medicine

## 2020-05-19 ENCOUNTER — Other Ambulatory Visit: Payer: Self-pay

## 2020-05-19 ENCOUNTER — Ambulatory Visit (INDEPENDENT_AMBULATORY_CARE_PROVIDER_SITE_OTHER): Payer: Medicare Other | Admitting: Family Medicine

## 2020-05-19 VITALS — BP 129/66 | HR 66 | Resp 16 | Ht 64.0 in | Wt 134.0 lb

## 2020-05-19 DIAGNOSIS — R0781 Pleurodynia: Secondary | ICD-10-CM | POA: Diagnosis not present

## 2020-05-19 DIAGNOSIS — R0600 Dyspnea, unspecified: Secondary | ICD-10-CM

## 2020-05-19 DIAGNOSIS — Z23 Encounter for immunization: Secondary | ICD-10-CM

## 2020-05-19 DIAGNOSIS — Z0000 Encounter for general adult medical examination without abnormal findings: Secondary | ICD-10-CM

## 2020-05-19 DIAGNOSIS — R0609 Other forms of dyspnea: Secondary | ICD-10-CM

## 2020-05-19 NOTE — Patient Instructions (Addendum)
F/u in office with MD in February, call if you need me sooner  Flu vaccine today  Please keep appointment with Lung Specialist this next Monday.   X ray of left ribs today to ensure no fracture  It is important that you exercise regularly at least 30 minutes 5 times a week. If you develop chest pain, have severe difficulty breathing, or feel very tired, stop exercising immediately and seek medical attention  Think about what you will eat, plan ahead. Choose " clean, green, fresh or frozen" over canned, processed or packaged foods which are more sugary, salty and fatty. 70 to 75% of food eaten should be vegetables and fruit. Three meals at set times with snacks allowed between meals, but they must be fruit or vegetables. Aim to eat over a 12 hour period , example 7 am to 7 pm, and STOP after  your last meal of the day. Drink water,generally about 64 ounces per day, no other drink is as healthy. Fruit juice is best enjoyed in a healthy way, by EATING the fruit. Thanks for choosing [facility], we consider it a privelige to serve you.

## 2020-05-19 NOTE — Assessment & Plan Note (Signed)

## 2020-05-19 NOTE — Progress Notes (Signed)
    Tanya [name]     MRN: [id number]      DOB: [birth year]  HPI: Patient is in for annual physical exam. C/o loud pop under left breast while moving furniture 2 weeks ago and c/o point tenderness, concerned re rib fracture, wants x ray  C/o itching on back , but no rash, some relief with medication Recent labs, if available are reviewed. Immunization is reviewed , and  updated    PE: BP 129/66   Pulse 66   Resp 16   Ht 5\' 4"  (1.626 m)   Wt 134 lb (60.8 kg)   SpO2 97%   BMI 23.00 kg/m   Pleasant  female, alert and oriented x 3, in no cardio-pulmonary distress. Afebrile. HEENT No facial trauma or asymetry. Sinuses non tender.  Extra occullar muscles intact.. External ears normal, . Neck: supple, no adenopathy,JVD or thyromegaly.No bruits.  Chest: Clear to ascultation bilaterally.No crackles or wheezes.  tender to palpation over left lower rib anterior axillary line  Breast: No asymetry,no masses or lumps. No tenderness. No nipple discharge or inversion. No axillary or supraclavicular adenopathy  Cardiovascular system; Heart sounds normal,  S1 and  S2 ,no S3.  No murmur, or thrill. Apical beat not displaced Peripheral pulses normal.  Abdomen: Soft, non tender, no organomegaly or masses. No bruits. Bowel sounds normal. No guarding, tenderness or rebound.     Musculoskeletal exam: Full ROM of spine, hips , shoulders and knees. No deformity ,swelling or crepitus noted. No muscle wasting or atrophy.   Neurologic: Cranial nerves 2 to 12 intact. Power, tone ,sensation and reflexes normal throughout. No disturbance in gait. No tremor.  Skin: Intact, no ulceration, erythema , scaling or rash noted. Pigmentation normal throughout  Psych; Normal mood and affect. Judgement and concentration normal   Assessment & Plan:  Annual physical exam Annual exam as documented. Counseling done  re healthy lifestyle involving commitment to 150 minutes exercise per week,  heart healthy diet, and attaining healthy weight.The importance of adequate sleep also discussed. Regular seat belt use and home safety, is also discussed. Changes in health habits are decided on by the patient with goals and time frames  set for achieving them. Immunization and cancer screening needs are specifically addressed at this visit.   Exertional dyspnea Has appt with Pulmonary next week  Rib pain on left side X ray left rib obtained  Need for influenza vaccination After obtaining informed consent, the vaccine is  administered , with no adverse effect noted at the time of administration.

## 2020-05-22 ENCOUNTER — Encounter: Payer: Self-pay | Admitting: Family Medicine

## 2020-05-22 NOTE — Assessment & Plan Note (Signed)
X ray left rib obtained

## 2020-05-22 NOTE — Assessment & Plan Note (Signed)
Has appt with Pulmonary next week

## 2020-05-22 NOTE — Assessment & Plan Note (Signed)
After obtaining informed consent, the vaccine is  administered , with no adverse effect noted at the time of administration.  

## 2020-05-23 ENCOUNTER — Ambulatory Visit (HOSPITAL_COMMUNITY)
Admission: RE | Admit: 2020-05-23 | Discharge: 2020-05-23 | Disposition: A | Discharge status: Home / Self Care | Payer: Medicare Other | Source: Ambulatory Visit | Attending: Family Medicine | Admitting: Family Medicine

## 2020-05-23 ENCOUNTER — Institutional Professional Consult (permissible substitution): Payer: PRIVATE HEALTH INSURANCE | Admitting: Internal Medicine

## 2020-05-23 ENCOUNTER — Telehealth: Payer: Self-pay

## 2020-05-23 ENCOUNTER — Other Ambulatory Visit: Payer: Self-pay | Admitting: Family Medicine

## 2020-05-23 ENCOUNTER — Other Ambulatory Visit: Payer: Self-pay

## 2020-05-23 DIAGNOSIS — R0781 Pleurodynia: Secondary | ICD-10-CM | POA: Diagnosis not present

## 2020-05-23 DIAGNOSIS — S299XXA Unspecified injury of thorax, initial encounter: Secondary | ICD-10-CM | POA: Diagnosis not present

## 2020-05-23 DIAGNOSIS — Z955 Presence of coronary angioplasty implant and graft: Secondary | ICD-10-CM | POA: Diagnosis not present

## 2020-05-23 MED ORDER — METOPROLOL SUCCINATE ER 25 MG PO TB24: 25.0000 mg | ORAL_TABLET | Freq: Every day | ORAL | 3 refills | Status: DC

## 2020-05-23 NOTE — Telephone Encounter (Signed)
New message    Needs new prescription sent in   *STAT* If patient is at the pharmacy, call can be transferred to refill team.   1. Which medications need to be refilled? (please list name of each medication and dose if known)  metoprolol succinate (TOPROL XL) 25 MG 24 hr tablet Take 1 tablet (25 mg total) by mouth daily.     2. Which pharmacy/location (including street and city if local pharmacy) is medication to be sent to? [facility] in [place]   3. Do they need a 30 day or 90 day supply?[place]

## 2020-05-23 NOTE — Telephone Encounter (Signed)
Refill complete 

## 2020-05-25 ENCOUNTER — Encounter: Payer: Medicare Other | Admitting: Family Medicine

## 2020-06-22 DIAGNOSIS — L298 Other pruritus: Secondary | ICD-10-CM | POA: Diagnosis not present

## 2020-06-27 DIAGNOSIS — L298 Other pruritus: Secondary | ICD-10-CM | POA: Diagnosis not present

## 2020-06-28 ENCOUNTER — Telehealth: Payer: Self-pay

## 2020-06-28 ENCOUNTER — Other Ambulatory Visit: Payer: Self-pay | Admitting: Family Medicine

## 2020-06-28 MED ORDER — TEMAZEPAM 15 MG PO CAPS
15.0000 mg | ORAL_CAPSULE | Freq: Every evening | ORAL | 5 refills | Status: DC | PRN
Start: 2020-06-29 — End: 2020-07-05

## 2020-06-28 NOTE — Telephone Encounter (Signed)
Sent in medication

## 2020-06-28 NOTE — Telephone Encounter (Signed)
Please call in Temazepam to [facility] in [place].

## 2020-07-05 ENCOUNTER — Encounter: Payer: Self-pay | Admitting: Internal Medicine

## 2020-07-05 ENCOUNTER — Ambulatory Visit (INDEPENDENT_AMBULATORY_CARE_PROVIDER_SITE_OTHER): Payer: Medicare Other | Admitting: Internal Medicine

## 2020-07-05 ENCOUNTER — Other Ambulatory Visit: Payer: Self-pay

## 2020-07-05 DIAGNOSIS — R0609 Other forms of dyspnea: Secondary | ICD-10-CM

## 2020-07-05 DIAGNOSIS — R0600 Dyspnea, unspecified: Secondary | ICD-10-CM | POA: Diagnosis not present

## 2020-07-05 NOTE — Assessment & Plan Note (Addendum)
Onset 2015 - PFT's  09/22/14   FEV1 2.17 (92 % ) ratio 0.83  p 9 % improvement from saba p 0 prior to study with DLCO  12.40 (54%) corrects to 3.95 (84%)  for alv volume and FV curve nl exp/ severe truncation insp loop   - LHC  04/24/2019   40% eccentric mid LAD in-stent restenosis.  At completion of stent procedure in September 2020, there was residual stent deformity in this region.  There is no high-grade obstruction in the LAD stent.  The LAD is otherwise normal.  Normal left main  Normal circumflex  Normal RCA  Normal LV systolic function and end-diastolic pressure - Echo 5/62/1308 nl x for   diastolic dysfunction  -  65/03/8468   Walked RA  approx   500 ft  @ very fast pace  stopped due to  End of study , min  sob ,  sats still 96%    Extremely variable sob sometimes at rest, sometimes with walking 50 ft but still able to push a mower and sleeps fine flat s noct episodes assoc with only inspiratory abnormality on f/v (which must not be a "fixed blockage" as would not explain variability which is c/w dx of VCD but not apparent on today's exam p stating sob x 50 ft walking in here.  rec continue rx for GERD/ LPR acknowledging up front this is much more likely functional based on hx/  Reconditioning then cpst at 6 weeks           Each maintenance medication was reviewed in detail including emphasizing most importantly the difference between maintenance and prns and under what circumstances the prns are to be triggered using an action plan format where appropriate.  Total time for H and P, chart review, counseling,  directly observing portions of ambulatory 02 saturation study/  and generating customized AVS unique to this office visit / charting > 60 min

## 2020-07-05 NOTE — Patient Instructions (Addendum)
To get the most out of exercise, you need to be continuously aware that you are short of breath, but never out of breath, for 30 minutes daily. As you improve, it will actually be easier for you to do the same amount of exercise  in  30 minutes so always push to the level where you are short of breath.     If not improving what you will need is a cpst - please call in 6 weeks to schedule if not impoving.  Late ADD:   Chart review suggests also dx of possible gerd/lpr so rec give diet:  GERD (REFLUX)  is an extremely common cause of respiratory symptoms just like yours , many times with no obvious heartburn at all.    It can be treated with medication, but also with lifestyle changes including elevation of the head of your bed (ideally with 6 -8inch blocks under the headboard of your bed),  Smoking cessation, avoidance of late meals, excessive alcohol, and avoid fatty foods, chocolate, peppermint, colas, red wine, and acidic juices such as orange juice.  NO MINT OR MENTHOL PRODUCTS SO NO COUGH DROPS  USE SUGARLESS CANDY INSTEAD (Jolley ranchers or Stover's or Life Savers) or even ice chips will also do - the key is to swallow to prevent all throat clearing. NO OIL BASED VITAMINS - use powdered substitutes.  Avoid fish oil when coughing.   And be sure to take protonix 40 Take 30-60 min before first meal of the day including the day of the cpst.

## 2020-07-05 NOTE — Progress Notes (Signed)
[id number]  Tanya [name], female    DOB: 1950-12-26,   MRN: [id number]   Brief patient profile:  69 [place] in [place] since 1980 and new onset doe x 2015  and progressed since to point where can't even even walk 50 ft "sometimes"    History of Present Illness  07/05/2020  Pulmonary/ 1st office eval/ Tieara Flitton / [place] 50 ft to office on day of 1st eval  Chief Complaint  Patient presents with  . Pulmonary Consult    Referred by [name], PA.  Pt c/o SOB x 2 years. She states she was winded walking from lobby to nearest exam room today.   Dyspnea:  Sometimes a  Little sob at rest and worse "when walk anywhere" s assoc cp or sweating / nausea  Uses treadmill once or twice a week using x 30 min s stopping at slt tilt (can't place higher due to headache)  Cough: sometimes but not often and no assoc with sob Sleep: ok flat/ one pillow  SABA use: none   No obvious patterns/ triggers in day to day   Variability says if sob in am stays sob all day including on day of of)  or assoc excess/ purulent sputum or mucus plugs or hemoptysis or cp or chest tightness, subjective wheeze or overt sinus or hb symptoms.   Sleeping ok flat  without nocturnal  or early am exacerbation  of respiratory  c/o's or need for noct saba. Also denies any obvious fluctuation of symptoms with weather or environmental changes or other aggravating or alleviating factors except as outlined above   No unusual exposure hx or h/o childhood pna/ asthma or knowledge of premature birth.  Current Allergies, Complete Past Medical History, Past Surgical History, Family History, and Social History were reviewed in [facility] record.  ROS  The following are not active complaints unless bolded Hoarseness, sore throat, dysphagia, dental problems, itching, sneezing,  nasal congestion or discharge of excess mucus or purulent secretions, ear ache,   fever, chills, sweats,  unintended wt loss or wt gain, classically pleuritic or exertional cp,  orthopnea pnd or arm/hand swelling  or leg swelling, presyncope, palpitations, abdominal pain, anorexia, nausea, vomiting, diarrhea  or change in bowel habits or change in bladder habits, change in stools or change in urine, dysuria, hematuria,  rash, arthralgias, visual complaints, headache, numbness, weakness or ataxia or problems with walking or coordination,  change in mood or  memory.           Past Medical History:  Diagnosis Date  . 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 Anhedonia   . Anxiety   . CAD (coronary artery disease)    a. s/p DESx2 to proximal LAD in 06/2018 b. cath in 04/2019 showing 40% mid-LAD ISR with no significant stenosis along LM, LCx, or RCA  . Cystitis   . Elevated liver enzymes   . Fatty liver   . GERD (gastroesophageal reflux disease)   . Headache    migraine  . Hyperlipidemia   . Hypertension   . Insomnia   . Unspecified hypothyroidism   . Vaginitis   . Wears glasses   . Wears hearing aid    B/L    Outpatient Medications Prior to Visit  Medication Sig Dispense Refill  . acetaminophen (TYLENOL) 500 MG tablet Take 1,000 mg by mouth every 6 (six) hours as needed for moderate pain.    Marland Kitchen amLODipine (NORVASC)  5 MG tablet Take 1 tablet (5 mg total) by mouth daily. 30 tablet 3  . cloNIDine (CATAPRES) 0.2 MG tablet TAKE 1 TABLET BY MOUTH AT  BEDTIME FOR BLOOD PRESSURE 90 tablet 0  . clopidogrel (PLAVIX) 75 MG tablet Take 1 tablet (75 mg total) by mouth daily. 90 tablet 3  . metoprolol succinate (TOPROL XL) 25 MG 24 hr tablet Take 1 tablet (25 mg total) by mouth daily. 90 tablet 3  . nitroGLYCERIN (NITROSTAT) 0.4 MG SL tablet Place 1 tablet (0.4 mg total) under the tongue every 5 (five) minutes as needed. (Patient taking differently: Place 0.4 mg under the tongue every 5 (five) minutes x 3 doses as needed for chest pain. ) 25 tablet 2  . pantoprazole (PROTONIX) 40 MG tablet Take 1 tablet (40  mg total) by mouth daily. 90 tablet 3  . PARoxetine (PAXIL) 10 MG tablet Take 1 tablet (10 mg total) by mouth daily. 30 tablet 3  . potassium chloride SA (KLOR-CON) 20 MEQ tablet Take 1 tablet (20 mEq total) by mouth daily. 30 tablet 3  . rosuvastatin (CRESTOR) 40 MG tablet TAKE 1 TABLET BY MOUTH  DAILY (Patient taking differently: Take 40 mg by mouth daily. ) 90 tablet 3  . Ascorbic Acid (VITAMIN C) 1000 MG tablet Take 1,000 mg by mouth daily.     Marland Kitchen aspirin EC 81 MG tablet Take 81 mg by mouth at bedtime.    . Biotin 5000 MCG CAPS Take 5,000 mcg by mouth daily.    . Carboxymethylcellulose Sodium (THERATEARS OP) Place 1 drop into both eyes 2 (two) times daily.     . diphenhydrAMINE (BENADRYL) 25 MG tablet Take 50 mg by mouth daily as needed for itching.     . Omega-3 Fatty Acids (FISH OIL) 1000 MG CAPS Take 1,000 mg by mouth daily.    . temazepam (RESTORIL) 15 MG capsule Take 1 capsule (15 mg total) by mouth at bedtime as needed for sleep. 30 capsule 4  . temazepam (RESTORIL) 15 MG capsule Take 1 capsule (15 mg total) by mouth at bedtime as needed for sleep. 30 capsule 5  . topiramate (TOPAMAX) 25 MG tablet Take 50 mg by mouth at bedtime.      No facility-administered medications prior to visit.     Objective:     BP (!) 154/76 (BP Location: Left Arm, Cuff Size: Normal)   Pulse 62   Temp (!) 97.1 F (36.2 C) (Temporal)   Ht 5\' 4"  (1.626 m)   Wt 135 lb (61.2 kg)   SpO2 98% Comment: on RA  BMI 23.17 kg/m   SpO2: 98 % (on RA)   Somber amb [place]ill[place] who speaks [place] but very difficult to understand thru mask and misunderstood many yes no questions during extensive hx   HEENT : pt wearing mask not removed for exam due to covid -19 concerns.    NECK :  without JVD/Nodes/TM/ nl carotid upstrokes bilaterally   LUNGS: no acc muscle use,  Nl contour chest which is clear to A and P bilaterally without cough on insp or exp maneuvers   CV:  RRR  no s3 or murmur or increase in P2, and  no edema   ABD:  soft and nontender with nl inspiratory excursion in the supine position. No bruits or organomegaly appreciated, bowel sounds nl  MS:  Nl gait/ ext warm without deformities, calf tenderness, cyanosis or clubbing No obvious joint restrictions   SKIN: warm and dry without lesions  NEURO:  alert, approp, nl sensorium with  no motor or cerebellar deficits apparent.      I personally reviewed images and agree with radiology impression as follows:  CXR:   03/28/20  No acute dz   Labs reviewed:      Chemistry      Component Value Date/Time   NA 143 03/28/2020 1323   K 3.8 03/28/2020 1323   CL 112 (H) 03/28/2020 1323   CO2 24 03/28/2020 1323   BUN 15 03/28/2020 1323   CREATININE 0.57 03/28/2020 1323   CREATININE 0.68 10/21/2019 1019      Component Value Date/Time   CALCIUM 9.4 03/28/2020 1323   ALKPHOS 48 06/29/2019 0946   AST 18 01/27/2020 1022   ALT 13 01/27/2020 1022   BILITOT 0.4 01/27/2020 1022        Lab Results  Component Value Date   WBC 4.4 03/28/2020   HGB 13.2 03/28/2020   HCT 39.5 03/28/2020   MCV 95.6 03/28/2020   PLT 302 03/28/2020     Lab Results  Component Value Date   DDIMER 0.27 06/29/2019      Lab Results  Component Value Date   TSH 1.23 02/24/2019     BNP    06/29/2019   =  14      Lab Results  Component Value Date   ESRSEDRATE 14 01/23/2018   ESRSEDRATE 9 05/06/2015             Assessment   No problem-specific Assessment & Plan notes found for this encounter.     Christinia Gully, MD 07/05/2020

## 2020-07-08 NOTE — Progress Notes (Signed)
Office Visit Note  Patient: Tanya [name]             Date of Birth: 07-14-1951           MRN: [id number]             PCP: [name], MD Referring: [name], MD Visit Date: 07/22/2020 Occupation: @GUAROCC @  Subjective:  Osteoarthritis (Bil hand pain, itching)   History of Present Illness: Tanya [name] is a 69 y.o. female with history of osteoarthritis.  She was accompanied by her husband today.  She states she continues to have pain and discomfort in her hands.  According to her husband is active all day outside in the yard.  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  She comes late at night and then still is active at night.  She complains of a lot of discomfort in her hands.  She takes Tylenol on as needed basis.  She complains of itching all over her body.  She states she has been seen by dermatologist and they have prescribed some lotions and medications.  She is also experiencing shortness of breath.  She had an appointment with pulmonologist and cardiologist and the work-up was negative per patient's husband.  Activities of Daily Living:  Patient reports morning stiffness for 24 hours.   Patient Denies nocturnal pain.  Difficulty dressing/grooming: Reports Difficulty climbing stairs: Denies Difficulty getting out of chair: Denies Difficulty using hands for taps, buttons, cutlery, and/or writing: Reports  Review of Systems  Constitutional: Positive for fatigue.  HENT: Negative for mouth dryness.   Eyes: Positive for dryness.  Respiratory: Positive for shortness of breath.   Cardiovascular: Negative for swelling in legs/feet.  Gastrointestinal: Positive for constipation.  Endocrine: Positive for cold intolerance, heat intolerance and excessive thirst.  Genitourinary: Negative for difficulty urinating.  Musculoskeletal: Positive for arthralgias, joint pain, joint swelling, morning stiffness and muscle tenderness.  Skin:  Negative for rash.  Allergic/Immunologic: Negative for susceptible to infections.  Neurological: Positive for light-headedness and numbness.  Hematological: Positive for bruising/bleeding tendency.  Psychiatric/Behavioral: Positive for sleep disturbance.    PMFS History:  Patient Active Problem List   Diagnosis Date Noted  . Rib pain on left side 05/19/2020  . Chest pain on exertion 03/29/2020  . DOE (dyspnea on exertion) 03/29/2020  . Near syncope 03/29/2020  . Brain aneurysm 12/21/2019  . 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 Sensory disturbance 06/29/2019  . Need for influenza vaccination 06/15/2019  . Educated about COVID-19 virus infection 02/15/2019  . CAD (coronary artery disease), native coronary artery 06/06/2018  . Headache 03/08/2018  . Primary osteoarthritis of both hands 02/13/2018  . Primary osteoarthritis of both knees 02/13/2018  . Anxiety 02/06/2017  . Pruritus 07/20/2016  . Arthritis 07/20/2016  . Annual physical exam 03/30/2016  . Tubular adenoma of colon 02/11/2016  . Coronary artery calcification seen on CAT scan - Moderate LAD lesion noted 11/16/2014  . Abnormal CT scan, chest 11/08/2014  . IGT (impaired glucose tolerance) 06/11/2014  . Osteopenia 01/27/2014  . Dyspnea 01/19/2014  . Microscopic hematuria 01/05/2013  . Urinary incontinence, mixed 01/05/2013  . GERD (gastroesophageal reflux disease) 12/16/2009  . Chest pain 03/01/2009  . Hyperlipemia 04/09/2008  . Essential hypertension 04/09/2008  . Insomnia disorder 04/09/2008    Past Medical History:  Diagnosis Date  . Aneurysm ([place])    left ICA 09/2019  . Anhedonia   . Anxiety   .  CAD (coronary artery disease)    a. s/p DESx2 to proximal LAD in 06/2018 b. cath in 04/2019 showing 40% mid-LAD ISR with no significant stenosis along LM, LCx, or RCA  . Cystitis   . Elevated liver enzymes   . Fatty liver   . GERD (gastroesophageal reflux disease)   . Headache    migraine  .  Hyperlipidemia   . Hypertension   . Insomnia   . Osteoarthritis   . Unspecified hypothyroidism   . Vaginitis   . Wears glasses   . Wears hearing aid    B/L    Family History  Problem Relation Age of Onset  . Dementia Mother   . Heart disease Mother        heart attack   . Hypertension Mother   . Heart attack Father   . Heart disease Father        heart attack   . Hypertension Father    Past Surgical History:  Procedure Laterality Date  . CATARACT EXTRACTION W/PHACO Right 05/08/2019   Procedure: CATARACT EXTRACTION PHACO AND INTRAOCULAR LENS PLACEMENT (IOC);  Surgeon: Baruch Goldmann, MD;  [facility]: [facility];  Service: Ophthalmology;  Laterality: Right;  CDE: 10.02  . COLONOSCOPY N/A 02/08/2016   Procedure: COLONOSCOPY;  Surgeon: [name], MD;  [facility];  Service: Endoscopy;  Laterality: N/A;  1200 - moved to 11:45 - Ann to notify  . CORONARY STENT INTERVENTION N/A 06/06/2018   Procedure: CORONARY STENT INTERVENTION;  Surgeon: [name], [name], MD;  Location: [place] [facility];  Service: Cardiovascular;  Laterality: N/A;  . INTRAVASCULAR PRESSURE WIRE/FFR STUDY N/A 06/06/2018   Procedure: INTRAVASCULAR PRESSURE WIRE/FFR STUDY;  Surgeon: [name], [name], MD;  Location: [place] [facility];  Service: Cardiovascular;  Laterality: N/A;  . IR ANGIO INTRA EXTRACRAN SEL COM CAROTID INNOMINATE BILAT MOD SED  09/01/2019  . IR ANGIO INTRA EXTRACRAN SEL INTERNAL CAROTID UNI L MOD SED  12/21/2019  . IR ANGIO VERTEBRAL SEL VERTEBRAL BILAT MOD SED  09/01/2019  . IR ANGIOGRAM FOLLOW UP STUDY  12/21/2019  . IR TRANSCATH/EMBOLIZ  12/21/2019  . LEFT HEART CATH AND CORONARY ANGIOGRAPHY N/A 04/24/2019   Procedure: LEFT HEART CATH AND CORONARY ANGIOGRAPHY;  Surgeon: Belva Crome, MD;  Location: [place] [facility];  Service: Cardiovascular;  Laterality: N/A;  . LEFT HEART CATHETERIZATION WITH CORONARY ANGIOGRAM N/A 11/19/2014   Procedure: LEFT HEART CATHETERIZATION WITH CORONARY ANGIOGRAM;   Surgeon: [name], MD;  Location: [facility];  Service: Cardiovascular;  Laterality: N/A;  . RADIOLOGY WITH ANESTHESIA N/A 11/23/2019   Procedure: Drucilla Schmidt;  Surgeon: Luanne Bras, MD;  Location: [place];  Service: Radiology;  Laterality: N/A;  . RADIOLOGY WITH ANESTHESIA N/A 12/21/2019   Procedure: EMBOLIZATION;  Surgeon: Luanne Bras, MD;  Location: [place];  Service: Radiology;  Laterality: N/A;  . RIGHT/LEFT HEART CATH AND CORONARY ANGIOGRAPHY N/A 06/06/2018   Procedure: RIGHT/LEFT HEART CATH AND CORONARY ANGIOGRAPHY;  Surgeon: [name], [name], MD;  Location: [place] [facility];  Service: Cardiovascular;  Laterality: N/A;   Social History   Social History Narrative   Pt originally from the [place] been in [place] for 20 years     Immunization History  Administered Date(s) Administered  . Fluad Quad(high Dose 65+) 06/16/2019  . Influenza Whole 07/05/2008  . Influenza,inj,Quad PF,6+ Mos 07/17/2013, 08/01/2014, 05/24/2016, 05/19/2020  . Influenza-Unspecified 06/10/2017  . Moderna SARS-COVID-2 Vaccination 12/07/2019, 01/05/2020  . Pneumococcal Conjugate-13 11/08/2014  . Pneumococcal  Polysaccharide-23 09/06/2004, 03/30/2016  . Td 01/21/2003  . Tdap 07/17/2013  . Zoster 01/12/2011  . Zoster Recombinat (Shingrix) 03/25/2019     Objective: Vital Signs: BP 130/75 (BP Location: Left Arm, Patient Position: Sitting, Cuff Size: Normal)   Pulse (!) 58   Resp 16   Ht 5\' 4"  (1.626 m)   Wt 136 lb 12.8 oz (62.1 kg)   BMI 23.48 kg/m    Physical Exam Vitals and nursing note reviewed.  Constitutional:      Appearance: She is well-developed.  HENT:     Head: Normocephalic and atraumatic.  Eyes:     Conjunctiva/sclera: Conjunctivae normal.  Cardiovascular:     Rate and Rhythm: Normal rate and regular rhythm.     Heart sounds: Normal heart sounds.  Pulmonary:     Effort: Pulmonary effort is normal.     Breath sounds: Normal breath sounds.  Abdominal:     General: Bowel  sounds are normal.     Palpations: Abdomen is soft.  Musculoskeletal:     Cervical back: Normal range of motion.  Lymphadenopathy:     Cervical: No cervical adenopathy.  Skin:    General: Skin is warm and dry.     Capillary Refill: Capillary refill takes less than 2 seconds.  Neurological:     Mental Status: She is alert and oriented to person, place, and time.  Psychiatric:        Behavior: Behavior normal.      Musculoskeletal Exam: C-spine thoracic and lumbar spine were in good range of motion.  Shoulder joints, elbow joints, wrist joints with good range of motion.  She has bilateral PIP and DIP thickening and subluxation of some of the PIP and DIP joints.  No synovitis was noted.  Hip joints, knee joints with good range of motion.  She has PIP and DIP thickening in her feet. CDAI Exam: CDAI Score: -- Patient Global: --; Provider Global: -- Swollen: --; Tender: -- Joint Exam 07/22/2020   No joint exam has been documented for this visit   There is currently no information documented on the homunculus. Go to the Rheumatology activity and complete the homunculus joint exam.  Investigation: No additional findings.  Imaging: No results found.  Recent Labs: Lab Results  Component Value Date   WBC 4.4 03/28/2020   HGB 13.2 03/28/2020   PLT 302 03/28/2020   NA 143 03/28/2020   K 3.8 03/28/2020   CL 112 (H) 03/28/2020   CO2 24 03/28/2020   GLUCOSE 101 (H) 03/28/2020   BUN 15 03/28/2020   CREATININE 0.57 03/28/2020   BILITOT 0.4 01/27/2020   ALKPHOS 48 06/29/2019   AST 18 01/27/2020   ALT 13 01/27/2020   PROT 6.9 01/27/2020   ALBUMIN 4.1 06/29/2019   CALCIUM 9.4 03/28/2020   GFRAA >60 03/28/2020    Speciality Comments: No specialty comments available.  Procedures:  No procedures performed Allergies: Patient has no known allergies.   Assessment / Plan:     Visit Diagnoses: Primary osteoarthritis of both hands-she has severe osteoarthritis in her hands with DIP  and PIP subluxation.  She continues to have pain and discomfort in her hands.  No synovitis was noted.  I reviewed the x-rays from 2019 and discussed the findings again.  She cannot take any NSAIDs due to chronic anticoagulation.  Use of Tylenol was discussed.  I also offered referral to pain clinic which she declined.  I offered referral to  OT which she declined.  Have  given her a handout on hand exercises.  Chronic pain of both knees-she is currently not having much discomfort.  Primary osteoarthritis of both feet-she declines discomfort in her feet.  Pruritus-she complains of severe pruritus for a long time.  She is being followed by a dermatologist.  Osteopenia, unspecified location-she gets DEXA scan with her PCP. Other medical problems are listed as follows:  Coronary artery calcification seen on CAT scan - Moderate LAD lesion noted  Essential hypertension  History of fatty infiltration of liver  History of hyperlipidemia  Primary insomnia  Other fatigue  Orders: No orders of the defined types were placed in this encounter.  No orders of the defined types were placed in this encounter.   Follow-Up Instructions: Return in about 1 year (around 07/22/2021), or if symptoms worsen or fail to improve, for Osteoarthritis.   Bo Merino, MD  Note - This record has been created using Editor, commissioning.  Chart creation errors have been sought, but may not always  have been located. Such creation errors do not reflect on  the standard of medical care.

## 2020-07-18 DIAGNOSIS — M79672 Pain in left foot: Secondary | ICD-10-CM | POA: Diagnosis not present

## 2020-07-18 DIAGNOSIS — M79674 Pain in right toe(s): Secondary | ICD-10-CM | POA: Diagnosis not present

## 2020-07-18 DIAGNOSIS — L110 Acquired keratosis follicularis: Secondary | ICD-10-CM | POA: Diagnosis not present

## 2020-07-18 DIAGNOSIS — M79671 Pain in right foot: Secondary | ICD-10-CM | POA: Diagnosis not present

## 2020-07-18 DIAGNOSIS — I739 Peripheral vascular disease, unspecified: Secondary | ICD-10-CM | POA: Diagnosis not present

## 2020-07-19 ENCOUNTER — Other Ambulatory Visit: Payer: Self-pay

## 2020-07-19 MED ORDER — AMLODIPINE BESYLATE 5 MG PO TABS: 5.0000 mg | ORAL_TABLET | Freq: Every day | ORAL | 3 refills | Status: DC

## 2020-07-21 ENCOUNTER — Ambulatory Visit (INDEPENDENT_AMBULATORY_CARE_PROVIDER_SITE_OTHER): Payer: Medicare Other | Admitting: Cardiology

## 2020-07-21 ENCOUNTER — Encounter: Payer: Self-pay | Admitting: Cardiology

## 2020-07-21 ENCOUNTER — Other Ambulatory Visit: Payer: Self-pay

## 2020-07-21 VITALS — BP 116/66 | HR 68 | Ht 64.0 in | Wt 135.0 lb

## 2020-07-21 DIAGNOSIS — I10 Essential (primary) hypertension: Secondary | ICD-10-CM | POA: Diagnosis not present

## 2020-07-21 DIAGNOSIS — R0600 Dyspnea, unspecified: Secondary | ICD-10-CM | POA: Diagnosis not present

## 2020-07-21 DIAGNOSIS — R0609 Other forms of dyspnea: Secondary | ICD-10-CM

## 2020-07-21 DIAGNOSIS — M79604 Pain in right leg: Secondary | ICD-10-CM

## 2020-07-21 DIAGNOSIS — E782 Mixed hyperlipidemia: Secondary | ICD-10-CM | POA: Diagnosis not present

## 2020-07-21 DIAGNOSIS — I2510 Atherosclerotic heart disease of native coronary artery without angina pectoris: Secondary | ICD-10-CM | POA: Diagnosis not present

## 2020-07-21 DIAGNOSIS — M79605 Pain in left leg: Secondary | ICD-10-CM

## 2020-07-21 NOTE — Patient Instructions (Signed)
Medication Instructions:  Your physician recommends that you continue on your current medications as directed. Please refer to the Current Medication list given to you today.  *If you need a refill on your cardiac medications before your next appointment, please call your pharmacy*   Lab Work: None today If you have labs (blood work) drawn today and your tests are completely normal, you will receive your results only by: Marland Kitchen MyChart Message (if you have MyChart) OR . A paper copy in the mail If you have any lab test that is abnormal or we need to change your treatment, we will call you to review the results.   Testing/Procedures: Your physician has requested that you have an ankle brachial index (ABI). During this test an ultrasound and blood pressure cuff are used to evaluate the arteries that supply the arms and legs with blood. Allow thirty minutes for this exam. There are no restrictions or special instructions.to be done in the [place] office .     Follow-Up: At [facility], you and your health needs are our priority.  As part of our continuing mission to provide you with exceptional heart care, we have created designated Provider Care Teams.  These Care Teams include your primary Cardiologist (physician) and Advanced Practice Providers (APPs -  Physician Assistants and Nurse Practitioners) who all work together to provide you with the care you need, when you need it.  We recommend signing up for the patient portal called "MyChart".  Sign up information is provided on this After Visit Summary.  MyChart is used to connect with patients for Virtual Visits (Telemedicine).  Patients are able to view lab/test results, encounter notes, upcoming appointments, etc.  Non-urgent messages can be sent to your provider as well.   To learn more about what you can do with MyChart, go to [email or web address].    Your next appointment:   6 month(s)  The format for your next appointment:   In  Person  Provider:   [name], MD   Other Instructions None    Thank you for choosing [facility] !

## 2020-07-21 NOTE — Progress Notes (Signed)
Clinical Summary Tanya [name] is a 69 y.o.female seen today for follow up of the following medical problems.   1. CAD - s/p DESx2 to proximal LAD in 06/2018 with cath in 04/2019 showing 40% mid-LAD ISR with no significant stenosis along LM, LCx, or RCA. LVEDP 10  - infrequent chest pains at times, right sided - chronic ongoing SOB unchanged   2. HTN -  Compliant with meds  3. Hyperlipidemia 12/2019 TC 120 HDL 60 TG 80 LDL 44 - complant with statin  4. DOE - followed by pulmonary - working on regular exercise to improve conditioning.   5. Leg pains - left leg pains, left calf with walking   Past Medical History:  Diagnosis Date  . Aneurysm ([place])    left ICA 09/2019  . Anhedonia   . Anxiety   . CAD (coronary artery disease)    a. s/p DESx2 to proximal LAD in 06/2018 b. cath in 04/2019 showing 40% mid-LAD ISR with no significant stenosis along LM, LCx, or RCA  . Cystitis   . Elevated liver enzymes   . Fatty liver   . GERD (gastroesophageal reflux disease)   . Headache    migraine  . Hyperlipidemia   . Hypertension   . Insomnia   . Unspecified hypothyroidism   . Vaginitis   . Wears glasses   . Wears hearing aid    B/L     No Known Allergies   Current Outpatient Medications  Medication Sig Dispense Refill  . acetaminophen (TYLENOL) 500 MG tablet Take 1,000 mg by mouth every 6 (six) hours as needed for moderate pain.    Marland Kitchen amLODipine (NORVASC) 5 MG tablet Take 1 tablet (5 mg total) by mouth daily. 30 tablet 3  . cloNIDine (CATAPRES) 0.2 MG tablet TAKE 1 TABLET BY MOUTH AT  BEDTIME FOR BLOOD PRESSURE 90 tablet 0  . clopidogrel (PLAVIX) 75 MG tablet Take 1 tablet (75 mg total) by mouth daily. 90 tablet 3  . metoprolol succinate (TOPROL XL) 25 MG 24 hr tablet Take 1 tablet (25 mg total) by mouth daily. 90 tablet 3  . nitroGLYCERIN (NITROSTAT) 0.4 MG SL tablet Place 1 tablet (0.4 mg total) under the tongue every 5 (five) minutes as needed. (Patient taking  differently: Place 0.4 mg under the tongue every 5 (five) minutes x 3 doses as needed for chest pain. ) 25 tablet 2  . pantoprazole (PROTONIX) 40 MG tablet Take 1 tablet (40 mg total) by mouth daily. 90 tablet 3  . PARoxetine (PAXIL) 10 MG tablet Take 1 tablet (10 mg total) by mouth daily. 30 tablet 3  . potassium chloride SA (KLOR-CON) 20 MEQ tablet Take 1 tablet (20 mEq total) by mouth daily. 30 tablet 3  . rosuvastatin (CRESTOR) 40 MG tablet TAKE 1 TABLET BY MOUTH  DAILY (Patient taking differently: Take 40 mg by mouth daily. ) 90 tablet 3   No current facility-administered medications for this visit.     Past Surgical History:  Procedure Laterality Date  . CATARACT EXTRACTION W/PHACO Right 05/08/2019   Procedure: CATARACT EXTRACTION PHACO AND INTRAOCULAR LENS PLACEMENT (IOC);  Surgeon: Baruch Goldmann, MD;  [facility];  Service: Ophthalmology;  Laterality: Right;  CDE: 10.02  . COLONOSCOPY N/A 02/08/2016   Procedure: COLONOSCOPY;  Surgeon: [name], MD;  [facility];  Service: Endoscopy;  Laterality: N/A;  1200 - moved to 11:45 - Ann to notify  . CORONARY STENT INTERVENTION N/A 06/06/2018  Procedure: CORONARY STENT INTERVENTION;  Surgeon: [name], [name], MD;  Location: [place] [facility];  Service: Cardiovascular;  Laterality: N/A;  . INTRAVASCULAR PRESSURE WIRE/FFR STUDY N/A 06/06/2018   Procedure: INTRAVASCULAR PRESSURE WIRE/FFR STUDY;  Surgeon: [name], [name], MD;  Location: [place] [facility];  Service: Cardiovascular;  Laterality: N/A;  . IR ANGIO INTRA EXTRACRAN SEL COM CAROTID INNOMINATE BILAT MOD SED  09/01/2019  . IR ANGIO INTRA EXTRACRAN SEL INTERNAL CAROTID UNI L MOD SED  12/21/2019  . IR ANGIO VERTEBRAL SEL VERTEBRAL BILAT MOD SED  09/01/2019  . IR ANGIOGRAM FOLLOW UP STUDY  12/21/2019  . IR TRANSCATH/EMBOLIZ  12/21/2019  . LEFT HEART CATH AND CORONARY ANGIOGRAPHY N/A 04/24/2019   Procedure: LEFT HEART CATH AND CORONARY ANGIOGRAPHY;  Surgeon: Belva Crome,  MD;  Location: [place] [facility];  Service: Cardiovascular;  Laterality: N/A;  . LEFT HEART CATHETERIZATION WITH CORONARY ANGIOGRAM N/A 11/19/2014   Procedure: LEFT HEART CATHETERIZATION WITH CORONARY ANGIOGRAM;  Surgeon: [name], MD;  Location: [facility] CATH LAB;  Service: Cardiovascular;  Laterality: N/A;  . RADIOLOGY WITH ANESTHESIA N/A 11/23/2019   Procedure: Drucilla Schmidt;  Surgeon: Luanne Bras, MD;  Location: [place];  Service: Radiology;  Laterality: N/A;  . RADIOLOGY WITH ANESTHESIA N/A 12/21/2019   Procedure: EMBOLIZATION;  Surgeon: Luanne Bras, MD;  Location: [place];  Service: Radiology;  Laterality: N/A;  . RIGHT/LEFT HEART CATH AND CORONARY ANGIOGRAPHY N/A 06/06/2018   Procedure: RIGHT/LEFT HEART CATH AND CORONARY ANGIOGRAPHY;  Surgeon: [name], [name], MD;  Location: [place] [facility];  Service: Cardiovascular;  Laterality: N/A;     No Known Allergies    Family History  Problem Relation Age of Onset  . Dementia Mother   . Heart disease Mother        heart attack   . Hypertension Mother   . Heart attack Father   . Heart disease Father        heart attack   . Hypertension Father      Social History Tanya [name] reports that she has never smoked. She has never used smokeless tobacco. Tanya [name] reports no history of alcohol use.   Review of Systems CONSTITUTIONAL: No weight loss, fever, chills, weakness or fatigue.  HEENT: Eyes: No visual loss, blurred vision, double vision or yellow sclerae.No hearing loss, sneezing, congestion, runny nose or sore throat.  SKIN: No rash or itching.  CARDIOVASCULAR: per hpi RESPIRATORY: per hpi GASTROINTESTINAL: No anorexia, nausea, vomiting or diarrhea. No abdominal pain or blood.  GENITOURINARY: No burning on urination, no polyuria NEUROLOGICAL: No headache, dizziness, syncope, paralysis, ataxia, numbness or tingling in the extremities. No change in bowel or bladder control.  MUSCULOSKELETAL: No muscle, back pain,  joint pain or stiffness.  LYMPHATICS: No enlarged nodes. No history of splenectomy.  PSYCHIATRIC: No history of depression or anxiety.  ENDOCRINOLOGIC: No reports of sweating, cold or heat intolerance. No polyuria or polydipsia.  Marland Kitchen   Physical Examination Today's Vitals   07/21/20 0821  BP: 116/66  Pulse: 68  SpO2: 99%  Weight: 135 lb (61.2 kg)  Height: 5\' 4"  (1.626 m)   Body mass index is 23.17 kg/m.  Gen: resting comfortably, no acute distress HEENT: no scleral icterus, pupils equal round and reactive, no palptable cervical adenopathy,  CV Resp: Clear to auscultation bilaterally GI: abdomen is soft, non-tender, non-distended, normal bowel sounds, no hepatosplenomegaly MSK: extremities are warm, no edema.  Skin: warm, no rash Neuro:  no focal deficits Psych: appropriate  affect   Diagnostic Studies  Cardiac Catheterization: 04/2019  40% eccentric mid LAD in-stent restenosis. At completion of stent procedure in September 2020, there was residual stent deformity in this region. There is no high-grade obstruction in the LAD stent. The LAD is otherwise normal.  Normal left main  Normal circumflex  Normal RCA  Normal LV systolic function and end-diastolic pressure  RECOMMENDATIONS:   Dyspnea cannot be explained by findings on today's study based on absence of significant coronary obstruction and normal LV hemodynamics.  Continue dual antiplatelet therapy beyond 12 months due to overlapping stents in the LAD.  Echocardiogram: 06/2019 IMPRESSIONS    1. Left ventricular ejection fraction, by visual estimation, is 55 to  60%. The left ventricle has normal function. There is no left ventricular  hypertrophy.  2. Left ventricular diastolic Doppler parameters are consistent with  impaired relaxation pattern of LV diastolic filling.  3. Global right ventricle has normal systolic function.The right  ventricular size is normal. No increase in right ventricular  wall  thickness.  4. Left atrial size was normal.  5. Right atrial size was normal.  6. The mitral valve is normal in structure. Trace mitral valve  regurgitation. No evidence of mitral stenosis.  7. The tricuspid valve is normal in structure. Tricuspid valve  regurgitation was not visualized by color flow Doppler.  8. The aortic valve is tricuspid Aortic valve regurgitation was not  visualized by color flow Doppler. Mild aortic valve sclerosis without  stenosis.  9. The pulmonic valve was not well visualized. Pulmonic valve  regurgitation is not visualized by color flow Doppler.  10. Normal pulmonary artery systolic pressure.  11. The inferior vena cava IVC is small, suggesting low RA pressure and  hypovolemia.   Long-term Monitor: 01/2020  Sinus rhythm with rare PACs and PVCs and short bursts of SVT (possibly atrial tachycardia).   Assessment and Plan  1. CAD - no recent cardiac chest pains, continue current meds   2. DOE - chronic symptos, extensive cardiac testing last year was overall benign including cath 04/2019 without significant CAD and normal LVEDP, benign echo 06/2019 - following with pulmonary - working on regular exercise to improve conditioning.   3. HTN - at goal, continue current meds   4. Hyperlipidemia - continue statin, she is at goal.   5. Leg pains - obtain ABI  Arnoldo Lenis, M.D.

## 2020-07-22 ENCOUNTER — Encounter: Payer: Self-pay | Admitting: Rheumatology

## 2020-07-22 ENCOUNTER — Ambulatory Visit: Payer: Medicare Other | Admitting: Rheumatology

## 2020-07-22 VITALS — BP 130/75 | HR 58 | Resp 16 | Ht 64.0 in | Wt 136.8 lb

## 2020-07-22 DIAGNOSIS — M19071 Primary osteoarthritis, right ankle and foot: Secondary | ICD-10-CM

## 2020-07-22 DIAGNOSIS — Z8639 Personal history of other endocrine, nutritional and metabolic disease: Secondary | ICD-10-CM

## 2020-07-22 DIAGNOSIS — M25562 Pain in left knee: Secondary | ICD-10-CM

## 2020-07-22 DIAGNOSIS — Z8719 Personal history of other diseases of the digestive system: Secondary | ICD-10-CM

## 2020-07-22 DIAGNOSIS — I10 Essential (primary) hypertension: Secondary | ICD-10-CM

## 2020-07-22 DIAGNOSIS — M25561 Pain in right knee: Secondary | ICD-10-CM

## 2020-07-22 DIAGNOSIS — R5383 Other fatigue: Secondary | ICD-10-CM

## 2020-07-22 DIAGNOSIS — L299 Pruritus, unspecified: Secondary | ICD-10-CM | POA: Diagnosis not present

## 2020-07-22 DIAGNOSIS — M19072 Primary osteoarthritis, left ankle and foot: Secondary | ICD-10-CM

## 2020-07-22 DIAGNOSIS — G8929 Other chronic pain: Secondary | ICD-10-CM

## 2020-07-22 DIAGNOSIS — F5101 Primary insomnia: Secondary | ICD-10-CM

## 2020-07-22 DIAGNOSIS — M8580 Other specified disorders of bone density and structure, unspecified site: Secondary | ICD-10-CM | POA: Diagnosis not present

## 2020-07-22 DIAGNOSIS — R21 Rash and other nonspecific skin eruption: Secondary | ICD-10-CM

## 2020-07-22 DIAGNOSIS — M19042 Primary osteoarthritis, left hand: Secondary | ICD-10-CM

## 2020-07-22 DIAGNOSIS — M19041 Primary osteoarthritis, right hand: Secondary | ICD-10-CM | POA: Diagnosis not present

## 2020-07-22 DIAGNOSIS — I2510 Atherosclerotic heart disease of native coronary artery without angina pectoris: Secondary | ICD-10-CM

## 2020-07-22 NOTE — Patient Instructions (Signed)
Hand Exercises Hand exercises can be helpful for almost anyone. These exercises can strengthen the hands, improve flexibility and movement, and increase blood flow to the hands. These results can make work and daily tasks easier. Hand exercises can be especially helpful for people who have joint pain from arthritis or have nerve damage from overuse (carpal tunnel syndrome). These exercises can also help people who have injured a hand. Exercises Most of these hand exercises are gentle stretching and motion exercises. It is usually safe to do them often throughout the day. Warming up your hands before exercise may help to reduce stiffness. You can do this with gentle massage or by placing your hands in warm water for 10-15 minutes. It is normal to feel some stretching, pulling, tightness, or mild discomfort as you begin new exercises. This will gradually improve. Stop an exercise right away if you feel sudden, severe pain or your pain gets worse. Ask your health care provider which exercises are best for you. Knuckle bend or "claw" fist 1. Stand or sit with your arm, hand, and all five fingers pointed straight up. Make sure to keep your wrist straight during the exercise. 2. Gently bend your fingers down toward your palm until the tips of your fingers are touching the top of your palm. Keep your big knuckle straight and just bend the small knuckles in your fingers. 3. Hold this position for __________ seconds. 4. Straighten (extend) your fingers back to the starting position. Repeat this exercise 5-10 times with each hand. Full finger fist 1. Stand or sit with your arm, hand, and all five fingers pointed straight up. Make sure to keep your wrist straight during the exercise. 2. Gently bend your fingers into your palm until the tips of your fingers are touching the middle of your palm. 3. Hold this position for __________ seconds. 4. Extend your fingers back to the starting position, stretching every  joint fully. Repeat this exercise 5-10 times with each hand. Straight fist 1. Stand or sit with your arm, hand, and all five fingers pointed straight up. Make sure to keep your wrist straight during the exercise. 2. Gently bend your fingers at the big knuckle, where your fingers meet your hand, and the middle knuckle. Keep the knuckle at the tips of your fingers straight and try to touch the bottom of your palm. 3. Hold this position for __________ seconds. 4. Extend your fingers back to the starting position, stretching every joint fully. Repeat this exercise 5-10 times with each hand. Tabletop 1. Stand or sit with your arm, hand, and all five fingers pointed straight up. Make sure to keep your wrist straight during the exercise. 2. Gently bend your fingers at the big knuckle, where your fingers meet your hand, as far down as you can while keeping the small knuckles in your fingers straight. Think of forming a tabletop with your fingers. 3. Hold this position for __________ seconds. 4. Extend your fingers back to the starting position, stretching every joint fully. Repeat this exercise 5-10 times with each hand. Finger spread 1. Place your hand flat on a table with your palm facing down. Make sure your wrist stays straight as you do this exercise. 2. Spread your fingers and thumb apart from each other as far as you can until you feel a gentle stretch. Hold this position for __________ seconds. 3. Bring your fingers and thumb tight together again. Hold this position for __________ seconds. Repeat this exercise 5-10 times with each hand.   Making circles 1. Stand or sit with your arm, hand, and all five fingers pointed straight up. Make sure to keep your wrist straight during the exercise. 2. Make a circle by touching the tip of your thumb to the tip of your index finger. 3. Hold for __________ seconds. Then open your hand wide. 4. Repeat this motion with your thumb and each finger on your  hand. Repeat this exercise 5-10 times with each hand. Thumb motion 1. Sit with your forearm resting on a table and your wrist straight. Your thumb should be facing up toward the ceiling. Keep your fingers relaxed as you move your thumb. 2. Lift your thumb up as high as you can toward the ceiling. Hold for __________ seconds. 3. Bend your thumb across your palm as far as you can, reaching the tip of your thumb for the small finger (pinkie) side of your palm. Hold for __________ seconds. Repeat this exercise 5-10 times with each hand. Grip strengthening  1. Hold a stress ball or other soft ball in the middle of your hand. 2. Slowly increase the pressure, squeezing the ball as much as you can without causing pain. Think of bringing the tips of your fingers into the middle of your palm. All of your finger joints should bend when doing this exercise. 3. Hold your squeeze for __________ seconds, then relax. Repeat this exercise 5-10 times with each hand. Contact a health care provider if:  Your hand pain or discomfort gets much worse when you do an exercise.  Your hand pain or discomfort does not improve within 2 hours after you exercise. If you have any of these problems, stop doing these exercises right away. Do not do them again unless your health care provider says that you can. Get help right away if:  You develop sudden, severe hand pain or swelling. If this happens, stop doing these exercises right away. Do not do them again unless your health care provider says that you can. This information is not intended to replace advice given to you by your health care provider. Make sure you discuss any questions you have with your health care provider. Document Revised: 01/08/2019 Document Reviewed: 09/18/2018 Elsevier Patient Education  2020 [employer]  

## 2020-07-28 ENCOUNTER — Other Ambulatory Visit: Payer: Self-pay | Admitting: Cardiology

## 2020-07-28 DIAGNOSIS — I739 Peripheral vascular disease, unspecified: Secondary | ICD-10-CM

## 2020-08-09 ENCOUNTER — Other Ambulatory Visit: Payer: Self-pay | Admitting: Cardiology

## 2020-08-09 DIAGNOSIS — I739 Peripheral vascular disease, unspecified: Secondary | ICD-10-CM

## 2020-08-11 ENCOUNTER — Other Ambulatory Visit: Payer: Self-pay

## 2020-08-11 ENCOUNTER — Telehealth: Payer: Self-pay

## 2020-08-11 MED ORDER — POTASSIUM CHLORIDE CRYS ER 20 MEQ PO TBCR: 20.0000 meq | EXTENDED_RELEASE_TABLET | Freq: Every day | ORAL | 3 refills | Status: DC

## 2020-08-13 ENCOUNTER — Other Ambulatory Visit: Payer: Self-pay | Admitting: Family Medicine

## 2020-08-15 ENCOUNTER — Telehealth: Payer: Self-pay | Admitting: Family Medicine

## 2020-08-15 ENCOUNTER — Other Ambulatory Visit: Payer: Self-pay | Admitting: *Deleted

## 2020-08-15 MED ORDER — PAROXETINE HCL 10 MG PO TABS: 10.0000 mg | ORAL_TABLET | Freq: Every day | ORAL | 0 refills | Status: DC

## 2020-08-15 NOTE — Telephone Encounter (Signed)
Pt medication sent to pharmacy  

## 2020-08-15 NOTE — Telephone Encounter (Signed)
Patient needing a refill on paroxetine 10mg  sent to [facility] ph# [phone]

## 2020-08-18 ENCOUNTER — Ambulatory Visit (INDEPENDENT_AMBULATORY_CARE_PROVIDER_SITE_OTHER): Payer: Medicare Other

## 2020-08-18 DIAGNOSIS — I739 Peripheral vascular disease, unspecified: Secondary | ICD-10-CM

## 2020-08-22 ENCOUNTER — Other Ambulatory Visit: Payer: Self-pay | Admitting: Family Medicine

## 2020-08-24 ENCOUNTER — Telehealth: Payer: Self-pay | Admitting: *Deleted

## 2020-08-24 NOTE — Telephone Encounter (Signed)
-----   Message from Arnoldo Lenis, MD sent at 08/23/2020  1:58 PM EST ----- Tests show normal circulation in legs  Zandra Abts MD

## 2020-08-24 NOTE — Telephone Encounter (Signed)
Pt voiced understanding

## 2020-09-05 DIAGNOSIS — H16221 Keratoconjunctivitis sicca, not specified as Sjogren's, right eye: Secondary | ICD-10-CM | POA: Diagnosis not present

## 2020-09-05 DIAGNOSIS — Z961 Presence of intraocular lens: Secondary | ICD-10-CM | POA: Diagnosis not present

## 2020-09-05 DIAGNOSIS — H2512 Age-related nuclear cataract, left eye: Secondary | ICD-10-CM | POA: Diagnosis not present

## 2020-09-12 ENCOUNTER — Telehealth: Payer: Self-pay | Admitting: Family Medicine

## 2020-09-12 ENCOUNTER — Other Ambulatory Visit: Payer: Self-pay

## 2020-09-12 MED ORDER — PAROXETINE HCL 10 MG PO TABS: 10.0000 mg | ORAL_TABLET | Freq: Every day | ORAL | 5 refills | Status: DC

## 2020-09-12 NOTE — Telephone Encounter (Signed)
Refill med Paroxteine 10 mg Pharm: [facility]

## 2020-09-12 NOTE — Telephone Encounter (Signed)
Med refilled.

## 2020-09-25 ENCOUNTER — Other Ambulatory Visit: Payer: Self-pay | Admitting: Family Medicine

## 2020-09-26 ENCOUNTER — Other Ambulatory Visit: Payer: Self-pay | Admitting: Family Medicine

## 2020-09-28 ENCOUNTER — Encounter: Payer: Self-pay | Admitting: Internal Medicine

## 2020-09-28 ENCOUNTER — Ambulatory Visit (INDEPENDENT_AMBULATORY_CARE_PROVIDER_SITE_OTHER): Payer: Medicare Other | Admitting: Internal Medicine

## 2020-09-28 ENCOUNTER — Other Ambulatory Visit: Payer: Self-pay

## 2020-09-28 DIAGNOSIS — R0600 Dyspnea, unspecified: Secondary | ICD-10-CM | POA: Diagnosis not present

## 2020-09-28 DIAGNOSIS — R0609 Other forms of dyspnea: Secondary | ICD-10-CM

## 2020-09-28 MED ORDER — FAMOTIDINE 20 MG PO TABS: ORAL_TABLET | ORAL | 11 refills | Status: DC

## 2020-09-28 NOTE — Progress Notes (Signed)
Tanya [name], female    DOB: 03-Aug-1951,   MRN: [id number]  Brief patient profile:  69 [place] and new onset doe x 2015  and progressed since to point where can't even even walk 50 ft "sometimes"    History of Present Illness  07/05/2020  Pulmonary/ 1st office eval/ Tobenna Needs / [place] 50 ft to office on day of 1st eval  Chief Complaint  Patient presents with  . Pulmonary Consult    Referred by [name], PA.  Pt c/o SOB x 2 years. She states she was winded walking from lobby to nearest exam room today.   Dyspnea:  Sometimes a  Little sob at rest and worse "when walk anywhere" s assoc cp or sweating / nausea  Uses treadmill once or twice a week using x 30 min s stopping at slt tilt (can't pace higher due to headache)  Cough: sometimes but not often and no assoc with sob Sleep: ok flat/ one pillow  SABA use: none  rec To get the most out of exercise, you need to be continuously aware that you are short of breath, but never out of breath, for 30 minutes daily.     If not improving what you will need is a cpst - please call in 6 weeks to schedule if not impoving.  Late ADD:   Chart review suggests also dx of possible gerd/lpr so rec give diet: GERD diet   And be sure to take protonix 40 Take 30-60 min before first meal of the day including the day of the cpst.   09/28/2020  f/u ov/[facility] office/TRUE Garciamartinez re: unexplained sob/ fleeting cp and "really bad hb" not really sure what she takes for it. Chief Complaint  Patient presents with  . Follow-up    Shortness of breath with exertion  Dyspnea:  No change Cough: none  Sleeping: not disturbing sleep  SABA use: none  02: none  CP on R ant comes and goes  "when is the last time you had it"  ?   A  Can't remember  How long does it last "maybe a second"    No obvious day to day or daytime variability or assoc excess/ purulent sputum or mucus plugs or hemoptysis  or chest tightness, subjective  wheeze or overt sinus or hb symptoms.   Sleeping  without nocturnal  or early am exacerbation  of respiratory  c/o's or need for noct saba. Also denies any obvious fluctuation of symptoms with weather or environmental changes or other aggravating or alleviating factors except as outlined above   No unusual exposure hx or h/o childhood pna/ asthma or knowledge of premature birth.  Current Allergies, Complete Past Medical History, Past Surgical History, Family History, and Social History were reviewed in [employer] [employer] record.  ROS  The following are not active complaints unless bolded Hoarseness, sore throat, dysphagia, dental problems, itching, sneezing,  nasal congestion or discharge of excess mucus or purulent secretions, ear ache,   fever, chills, sweats, unintended wt loss or wt gain, classically pleuritic or exertional cp,  orthopnea pnd or arm/hand swelling  or leg swelling, presyncope, palpitations, abdominal pain, anorexia, nausea, vomiting, diarrhea  or change in bowel habits or change in bladder habits, change in stools or change in urine, dysuria, hematuria,  rash, arthralgias, visual complaints, headache, numbness, weakness or ataxia or problems with walking or coordination,  change in mood or  memory.  Current Meds -  - NOTE:   Unable to verify as accurately reflecting what pt takes      Medication Sig  . acetaminophen (TYLENOL) 500 MG tablet Take 1,000 mg by mouth every 6 (six) hours as needed for moderate pain.  Marland Kitchen amLODipine (NORVASC) 5 MG tablet TAKE 1 TABLET BY MOUTH  DAILY  . busPIRone (BUSPAR) 5 MG tablet buspirone 5 mg tablet  . cloNIDine (CATAPRES) 0.2 MG tablet TAKE 1 TABLET BY MOUTH AT  BEDTIME FOR BLOOD PRESSURE  . clopidogrel (PLAVIX) 75 MG tablet TAKE 1 TABLET BY MOUTH  DAILY  . escitalopram (LEXAPRO) 10 MG tablet escitalopram 10 mg tablet  TAKE 1 TABLET BY MOUTH ONCE DAILY  . metoprolol succinate (TOPROL-XL) 25 MG 24 hr tablet TAKE ONE-HALF  TABLET BY  MOUTH DAILY  . nitroGLYCERIN (NITROSTAT) 0.4 MG SL tablet Place 1 tablet (0.4 mg total) under the tongue every 5 (five) minutes as needed. (Patient taking differently: Place 0.4 mg under the tongue every 5 (five) minutes x 3 doses as needed for chest pain.)  . OXcarbazepine (TRILEPTAL) 150 MG tablet Take 300 mg by mouth 2 (two) times daily.  . pantoprazole (PROTONIX) 40 MG tablet TAKE 1 TABLET BY MOUTH  DAILY  . PARoxetine (PAXIL) 10 MG tablet Take 1 tablet (10 mg total) by mouth daily.  . potassium chloride SA (KLOR-CON) 20 MEQ tablet Take 1 tablet (20 mEq total) by mouth daily.  . prednisoLONE acetate (PRED FORTE) 1 % ophthalmic suspension prednisolone acetate 1 % eye drops,suspension  INSTILL 1 DROP INTO RIGHT EYE EVERY 8 HOURS SHAKE WELL  . rosuvastatin (CRESTOR) 40 MG tablet TAKE 1 TABLET BY MOUTH  DAILY (Patient taking differently: Take 40 mg by mouth daily.)  . temazepam (RESTORIL) 7.5 MG capsule temazepam 7.5 mg capsule  TAKE 1 CAPSULE BY MOUTH AT BEDTIME AS NEEDED FOR SLEEP                    Past Medical History:  Diagnosis Date  . 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 Anhedonia   . Anxiety   . CAD (coronary artery disease)    a. s/p DESx2 to proximal LAD in 06/2018 b. cath in 04/2019 showing 40% mid-LAD ISR with no significant stenosis along LM, LCx, or RCA  . Cystitis   . Elevated liver enzymes   . Fatty liver   . GERD (gastroesophageal reflux disease)   . Headache    migraine  . Hyperlipidemia   . Hypertension   . Insomnia   . Unspecified hypothyroidism   . Vaginitis   . Wears glasses   . Wears hearing aid    B/L        Objective:      Wt Readings from Last 3 Encounters:  09/28/20 138 lb (62.6 kg)  07/22/20 136 lb 12.8 oz (62.1 kg)  07/21/20 135 lb (61.2 kg)     Vital signs reviewed  09/28/2020  - Note at rest 02 sats  98% on RA   General appearance:   Anxious amb [place] female ? If really understanding fundamental/simple questions  being asked like "when did you last have cp or sob" or "what seems to bring it on"    HEENT : pt wearing mask not removed for exam due to covid -19 concerns.    NECK :  without JVD/Nodes/TM/ nl carotid upstrokes bilaterally   LUNGS: no acc muscle use,  Nl contour chest which is clear to  A and P bilaterally without cough on insp or exp maneuvers   CV:  RRR  no s3 or murmur or increase in P2, and no edema   ABD:  soft and nontender with nl inspiratory excursion in the supine position. No bruits or organomegaly appreciated, bowel sounds nl  MS:  Nl gait/ ext warm without deformities, calf tenderness, cyanosis or clubbing No obvious joint restrictions   SKIN: warm and dry without lesions    NEURO:  alert, approp, nl sensorium with  no motor or cerebellar deficits apparent.        I personally reviewed images and agree with radiology impression as follows:  CXR:   Pa and lateral 03/28/20 Lungs clear. Heart size normal. No pneumothorax or pleural fluid. Aortic atherosclerosis and coronary artery stent is noted. No bony abnormality.         Assessment

## 2020-09-28 NOTE — Patient Instructions (Addendum)
Be sure you are taking Pantoprazole (protonix) 40 mg   Take  30-60 min before first meal of the day and add  Pepcid (famotidine)  20 mg one after supper  until return to office - this is the best way to tell whether stomach acid is contributing to your problem.  GERD (REFLUX)  is an extremely common cause of respiratory symptoms just like yours , many times with no obvious heartburn at all.    It can be treated with medication, but also with lifestyle changes including elevation of the head of your bed (ideally with 6 -8inch blocks under the headboard of your bed),  Smoking cessation, avoidance of late meals, excessive alcohol, and avoid fatty foods, chocolate, peppermint, colas, red wine, and acidic juices such as orange juice.  NO MINT OR MENTHOL PRODUCTS SO NO COUGH DROPS  USE SUGARLESS CANDY INSTEAD (Jolley ranchers or Stover's or Life Savers) or even ice chips will also do - the key is to swallow to prevent all throat clearing. NO OIL BASED VITAMINS - use powdered substitutes.  Avoid fish oil when coughing.      I will let Dr Lodema Hong know that you will need to be referred to a stomach doctor of her choice since you still have bad heartburn despite saying you are taking the right medications for it.   If still having problems with breathing  after you finish your stomach problem evaluation then I will need to see you back with all your medications in hand to order to consider what the next step should be.    I very strongly recommend you get the moderna  vaccine booster as soon as possible based on your risk of dying from the virus  and the proven safety and benefit of these vaccines against even the delta or omicron variant.  This can save your life as well as  those of your loved ones,  especially if they are also not vaccinated.

## 2020-09-28 NOTE — Assessment & Plan Note (Signed)
Onset 2015 - PFT's  09/22/14   FEV1 2.17 (92 % ) ratio 0.83  p 9 % improvement from saba p 0 prior to study with DLCO  12.40 (54%) corrects to 3.95 (84%)  for alv volume and FV curve nl exp/ severe truncation insp loop   - LHC  04/24/2019   40% eccentric mid LAD in-stent restenosis.  At completion of stent procedure in September 2020, there was residual stent deformity in this region.  There is no high-grade obstruction in the LAD stent.  The LAD is otherwise normal.  Normal left main  Normal circumflex  Normal RCA  Normal LV systolic function and end-diastolic pressure - Echo 06/29/2019 nl x for   diastolic dysfunction  -  07/05/2020   Walked RA  approx   500 ft  @ very fast pace  stopped due to  End of study , min  sob ,  sats still 96%    Assoc with non cardiac cp is gerd related until proven otherwise and says has overt hb but not sure what she takes for it so rec  rx gerd more aggressivley:  Pantoprazole (protonix) 40 mg   Take  30-60 min before first meal of the day and Pepcid (famotidine)  20 mg one @  bedtime until return    Refer to GI  When w/u completed if still having doe return to office with all meds in hand using a trust but verify approach to confirm accurate Medication  Reconciliation The principal here is that until we are certain that the  patients are doing what we've asked, it makes no sense to ask them to do more.          Each maintenance medication was reviewed in detail including emphasizing most importantly the difference between maintenance and prns and under what circumstances the prns are to be triggered using an action plan format where appropriate.  Total time for H and P, chart review, counseling, teaching device and generating customized AVS unique to this office visit / charting  > 30 min

## 2020-09-29 ENCOUNTER — Telehealth: Payer: Self-pay

## 2020-09-29 ENCOUNTER — Other Ambulatory Visit: Payer: Self-pay

## 2020-09-29 MED ORDER — PANTOPRAZOLE SODIUM 40 MG PO TBEC: 40.0000 mg | DELAYED_RELEASE_TABLET | Freq: Every day | ORAL | 0 refills | Status: DC

## 2020-09-29 NOTE — Telephone Encounter (Signed)
Med refill needs enough to get her through until she receives her meds in the mail January 10th.    Pantoprazole 40 mg   Pharmacy [facility]

## 2020-09-29 NOTE — Telephone Encounter (Signed)
Temp supply sent

## 2020-10-03 ENCOUNTER — Other Ambulatory Visit: Payer: Self-pay

## 2020-10-03 ENCOUNTER — Encounter: Payer: Self-pay | Admitting: Internal Medicine

## 2020-10-03 ENCOUNTER — Ambulatory Visit (INDEPENDENT_AMBULATORY_CARE_PROVIDER_SITE_OTHER): Payer: Medicare Other | Admitting: Internal Medicine

## 2020-10-03 VITALS — BP 128/78 | HR 70 | Temp 98.1°F | Resp 18 | Ht 64.0 in | Wt 137.1 lb

## 2020-10-03 DIAGNOSIS — R140 Abdominal distension (gaseous): Secondary | ICD-10-CM | POA: Diagnosis not present

## 2020-10-03 DIAGNOSIS — Z8679 Personal history of other diseases of the circulatory system: Secondary | ICD-10-CM | POA: Insufficient documentation

## 2020-10-03 DIAGNOSIS — K219 Gastro-esophageal reflux disease without esophagitis: Secondary | ICD-10-CM

## 2020-10-03 DIAGNOSIS — M25552 Pain in left hip: Secondary | ICD-10-CM | POA: Insufficient documentation

## 2020-10-03 MED ORDER — SIMETHICONE 80 MG PO TABS: 1.0000 | ORAL_TABLET | Freq: Two times a day (BID) | ORAL | 1 refills | Status: DC | PRN

## 2020-10-03 NOTE — Progress Notes (Signed)
Established Patient Office Visit  Subjective:  Patient ID: Tanya [name], female    DOB: [birth year]  Age: 70 y.o. MRN: [id number]  CC:  Chief Complaint  Patient presents with  . GI Problem    Pt feels bloated all the time also her left leg has been hurting going up to her hip. She has been sob for a long time also     HPI Tanya [name] is a 70 year old female with PMH of CAD s/p stent placement, HTN, GERD and osteoarthritis who presents for c/o bloating sensation, which is intermittent and is not associated with food intake. Of note, patient has a h/o GERD and has run out of Pantoprazole. Patient has not picked up her medication from pharmacy while waiting for mail order supply. Patient denies nausea, vomiting, abdominal pain, constipation/ diarrhea, melena.  She also c/o left hip pain associated with left leg pain. She denies any numbness, weakness or tingling. She has h/o arthritis, for which she visited a Rheumatologist and was advised to get OT for hand pain, but patient denied. Patient denies PT as she has to work during the afternoon hours. She is willing to be seen by Orthopedic surgeon for evaluation of left hip pain.  Past Medical History:  Diagnosis Date  . 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 Anhedonia   . Anxiety   . CAD (coronary artery disease)    a. s/p DESx2 to proximal LAD in 06/2018 b. cath in 04/2019 showing 40% mid-LAD ISR with no significant stenosis along LM, LCx, or RCA  . Cystitis   . Elevated liver enzymes   . Fatty liver   . GERD (gastroesophageal reflux disease)   . Headache    migraine  . Hyperlipidemia   . Hypertension   . Insomnia   . Osteoarthritis   . Unspecified hypothyroidism   . Vaginitis   . Wears glasses   . Wears hearing aid    B/L    Past Surgical History:  Procedure Laterality Date  . CATARACT EXTRACTION W/PHACO Right 05/08/2019   Procedure: CATARACT EXTRACTION PHACO AND INTRAOCULAR LENS PLACEMENT (IOC);  Surgeon: Baruch Goldmann, MD;  Location: [facility];  Service: Ophthalmology;  Laterality: Right;  CDE: 10.02  . COLONOSCOPY N/A 02/08/2016   Procedure: COLONOSCOPY;  Surgeon: [name], MD;  [facility]: [facility];  Service: Endoscopy;  Laterality: N/A;  1200 - moved to 11:45 - Ann to notify  . CORONARY STENT INTERVENTION N/A 06/06/2018   Procedure: CORONARY STENT INTERVENTION;  Surgeon: [name], [name], MD;  Location: [place] [facility];  Service: Cardiovascular;  Laterality: N/A;  . INTRAVASCULAR PRESSURE WIRE/FFR STUDY N/A 06/06/2018   Procedure: INTRAVASCULAR PRESSURE WIRE/FFR STUDY;  Surgeon: [name], [name], MD;  Location: [place] [facility];  Service: Cardiovascular;  Laterality: N/A;  . IR ANGIO INTRA EXTRACRAN SEL COM CAROTID INNOMINATE BILAT MOD SED  09/01/2019  . IR ANGIO INTRA EXTRACRAN SEL INTERNAL CAROTID UNI L MOD SED  12/21/2019  . IR ANGIO VERTEBRAL SEL VERTEBRAL BILAT MOD SED  09/01/2019  . IR ANGIOGRAM FOLLOW UP STUDY  12/21/2019  . IR TRANSCATH/EMBOLIZ  12/21/2019  . LEFT HEART CATH AND CORONARY ANGIOGRAPHY N/A 04/24/2019   Procedure: LEFT HEART CATH AND CORONARY ANGIOGRAPHY;  Surgeon: Belva Crome, MD;  Location: [place] [facility];  Service: Cardiovascular;  Laterality: N/A;  . LEFT HEART CATHETERIZATION WITH CORONARY ANGIOGRAM N/A 11/19/2014   Procedure: LEFT HEART CATHETERIZATION WITH CORONARY ANGIOGRAM;  Surgeon: Marykay Lex, MD;  Location: [facility] CATH LAB;  Service: Cardiovascular;  Laterality: N/A;  . RADIOLOGY WITH ANESTHESIA N/A 11/23/2019   Procedure: Otho Bellows;  Surgeon: Julieanne Cotton, MD;  Location: [facility];  Service: Radiology;  Laterality: N/A;  . RADIOLOGY WITH ANESTHESIA N/A 12/21/2019   Procedure: EMBOLIZATION;  Surgeon: Julieanne Cotton, MD;  Location: [facility] OR;  Service: Radiology;  Laterality: N/A;  . RIGHT/LEFT HEART CATH AND CORONARY ANGIOGRAPHY N/A 06/06/2018   Procedure: RIGHT/LEFT HEART CATH AND CORONARY ANGIOGRAPHY;  Surgeon: Swaziland, [name], MD;  Location: [facility] INVASIVE CV  LAB;  Service: Cardiovascular;  Laterality: N/A;    Family History  Problem Relation Age of Onset  . Dementia Mother   . Heart disease Mother        heart attack   . Hypertension Mother   . Heart attack Father   . Heart disease Father        heart attack   . Hypertension Father     Social History   Socioeconomic History  . Marital status: Married    Spouse name: Not on file  . Number of children: Not on file  . Years of education: Not on file  . Highest education level: Not on file  Occupational History  . Not on file  Tobacco Use  . Smoking status: Never Smoker  . Smokeless tobacco: Never Used  Vaping Use  . Vaping Use: Never used  Substance and Sexual Activity  . Alcohol use: No    Alcohol/week: 0.0 standard drinks  . Drug use: No  . Sexual activity: Not on file  Other Topics Concern  . Not on file  Social History Narrative   [place] been in [place] for 20 years     Social Determinants of Corporate investment banker Strain: Not on file  Food Insecurity: Not on file  Transportation Needs: Not on file  Physical Activity: Not on file  Stress: Not on file  Social Connections: Not on file  Intimate Partner Violence: Not on file    Outpatient Medications Prior to Visit  Medication Sig Dispense Refill  . acetaminophen (TYLENOL) 500 MG tablet Take 1,000 mg by mouth every 6 (six) hours as needed for moderate pain.    Marland Kitchen amLODipine (NORVASC) 5 MG tablet TAKE 1 TABLET BY MOUTH  DAILY 90 tablet 3  . busPIRone (BUSPAR) 5 MG tablet buspirone 5 mg tablet    . cloNIDine (CATAPRES) 0.2 MG tablet TAKE 1 TABLET BY MOUTH AT  BEDTIME FOR BLOOD PRESSURE 90 tablet 3  . clopidogrel (PLAVIX) 75 MG tablet TAKE 1 TABLET BY MOUTH  DAILY 90 tablet 3  . escitalopram (LEXAPRO) 10 MG tablet escitalopram 10 mg tablet  TAKE 1 TABLET BY MOUTH ONCE DAILY    . metoprolol succinate (TOPROL-XL) 25 MG 24 hr tablet TAKE ONE-HALF TABLET BY  MOUTH DAILY 45 tablet 3  .  nitroGLYCERIN (NITROSTAT) 0.4 MG SL tablet Place 1 tablet (0.4 mg total) under the tongue every 5 (five) minutes as needed. (Patient taking differently: Place 0.4 mg under the tongue every 5 (five) minutes x 3 doses as needed for chest pain.) 25 tablet 2  . OXcarbazepine (TRILEPTAL) 150 MG tablet Take 300 mg by mouth 2 (two) times daily.    . pantoprazole (PROTONIX) 40 MG tablet Take 1 tablet (40 mg total) by mouth daily. 15 tablet 0  . PARoxetine (PAXIL) 10 MG tablet Take 1 tablet (10 mg total) by mouth daily. 30  tablet 5  . potassium chloride SA (KLOR-CON) 20 MEQ tablet Take 1 tablet (20 mEq total) by mouth daily. 30 tablet 3  . prednisoLONE acetate (PRED FORTE) 1 % ophthalmic suspension prednisolone acetate 1 % eye drops,suspension  INSTILL 1 DROP INTO RIGHT EYE EVERY 8 HOURS SHAKE WELL    . rosuvastatin (CRESTOR) 40 MG tablet TAKE 1 TABLET BY MOUTH  DAILY (Patient taking differently: Take 40 mg by mouth daily.) 90 tablet 3  . temazepam (RESTORIL) 7.5 MG capsule temazepam 7.5 mg capsule  TAKE 1 CAPSULE BY MOUTH AT BEDTIME AS NEEDED FOR SLEEP    . famotidine (PEPCID) 20 MG tablet One after supper 30 tablet 11  . topiramate (TOPAMAX) 25 MG tablet Take 25 mg by mouth 2 (two) times daily.     No facility-administered medications prior to visit.    No Known Allergies  ROS Review of Systems  Constitutional: Negative for chills and fever.  HENT: Negative for congestion, sinus pressure, sinus pain and sore throat.   Eyes: Negative for pain and discharge.  Respiratory: Negative for cough and shortness of breath.   Cardiovascular: Negative for chest pain and palpitations.  Gastrointestinal: Positive for abdominal distention. Negative for abdominal pain, constipation, diarrhea, nausea and vomiting.  Endocrine: Negative for polydipsia and polyuria.  Genitourinary: Negative for dysuria and hematuria.  Musculoskeletal: Positive for arthralgias. Negative for neck pain and neck stiffness.  Skin:  Negative for rash.  Neurological: Negative for dizziness and weakness.  Psychiatric/Behavioral: Negative for agitation and behavioral problems.      Objective:    Physical Exam Vitals reviewed.  Constitutional:      General: She is not in acute distress.    Appearance: She is not diaphoretic.  HENT:     Head: Normocephalic and atraumatic.     Nose: Nose normal. No congestion.     Mouth/Throat:     Mouth: Mucous membranes are moist.     Pharynx: No posterior oropharyngeal erythema.  Eyes:     General: No scleral icterus.    Extraocular Movements: Extraocular movements intact.     Pupils: Pupils are equal, round, and reactive to light.  Cardiovascular:     Rate and Rhythm: Normal rate and regular rhythm.     Pulses: Normal pulses.     Heart sounds: Normal heart sounds. No murmur heard.   Pulmonary:     Breath sounds: Normal breath sounds. No wheezing or rales.  Abdominal:     Palpations: Abdomen is soft.     Tenderness: There is no abdominal tenderness. There is no guarding or rebound.  Musculoskeletal:     Cervical back: Neck supple. No tenderness.     Right lower leg: No edema.     Left lower leg: No edema.  Skin:    General: Skin is warm.     Findings: No rash.  Neurological:     General: No focal deficit present.     Mental Status: She is alert and oriented to person, place, and time.     Sensory: No sensory deficit.     Motor: No weakness.  Psychiatric:        Mood and Affect: Mood normal.        Behavior: Behavior normal.     BP 128/78 (BP Location: Right Arm, Patient Position: Sitting, Cuff Size: Normal)   Pulse 70   Temp 98.1 F (36.7 C) (Oral)   Resp 18   Ht 5\' 4"  (1.626 m)   Wt 137 lb  1.9 oz (62.2 kg)   SpO2 98%   BMI 23.54 kg/m  Wt Readings from Last 3 Encounters:  10/03/20 137 lb 1.9 oz (62.2 kg)  09/28/20 138 lb (62.6 kg)  07/22/20 136 lb 12.8 oz (62.1 kg)     Health Maintenance Due  Topic Date Due  . COVID-19 Vaccine (3 - Booster for  Moderna series) 07/06/2020    There are no preventive care reminders to display for this patient.  Lab Results  Component Value Date   TSH 1.23 02/24/2019   Lab Results  Component Value Date   WBC 4.4 03/28/2020   HGB 13.2 03/28/2020   HCT 39.5 03/28/2020   MCV 95.6 03/28/2020   PLT 302 03/28/2020   Lab Results  Component Value Date   NA 143 03/28/2020   K 3.8 03/28/2020   CO2 24 03/28/2020   GLUCOSE 101 (H) 03/28/2020   BUN 15 03/28/2020   CREATININE 0.57 03/28/2020   BILITOT 0.4 01/27/2020   ALKPHOS 48 06/29/2019   AST 18 01/27/2020   ALT 13 01/27/2020   PROT 6.9 01/27/2020   ALBUMIN 4.1 06/29/2019   CALCIUM 9.4 03/28/2020   ANIONGAP 7 03/28/2020   Lab Results  Component Value Date   CHOL 120 01/27/2020   Lab Results  Component Value Date   HDL 60 01/27/2020   Lab Results  Component Value Date   LDLCALC 44 01/27/2020   Lab Results  Component Value Date   TRIG 80 01/27/2020   Lab Results  Component Value Date   CHOLHDL 2.0 01/27/2020   Lab Results  Component Value Date   HGBA1C 6.1 (H) 06/29/2019      Assessment & Plan:   Problem List Items Addressed This Visit      Digestive   GERD (gastroesophageal reflux disease) Continue Pantoprazole Discontinued Pepcid   Relevant Medications   Simethicone 80 MG TABS     Other   Left hip pain - Primary Likely related to OA Tylenol PRN Referral to Orthopedic surgery Patient not willing for PT currently   Relevant Orders   Ambulatory referral to Orthopedic Surgery   Abdominal bloating Trial of Simethicone GERD symptoms might mimic bloating as well, patient ran out of Pantoprazole. Advised to resume Pantoprazole    Relevant Medications   Simethicone 80 MG TABS      Meds ordered this encounter  Medications  . Simethicone 80 MG TABS    Sig: Take 1 tablet (80 mg total) by mouth 2 (two) times daily as needed.    Dispense:  60 tablet    Refill:  1    Follow-up: Return in about 3 months  (around 01/01/2021), or if symptoms worsen or fail to improve.    Anabel Halon, MD

## 2020-10-03 NOTE — Patient Instructions (Signed)
Please take Simethicone as needed for bloating as prescribed.  Please start taking Pantoprazole for acid reflux.  Please follow up with Orthopedic surgeon for left hip pain.

## 2020-10-14 ENCOUNTER — Encounter: Payer: Self-pay | Admitting: Orthopedic Surgery

## 2020-10-14 ENCOUNTER — Other Ambulatory Visit: Payer: Self-pay

## 2020-10-14 ENCOUNTER — Ambulatory Visit: Payer: PRIVATE HEALTH INSURANCE

## 2020-10-14 ENCOUNTER — Ambulatory Visit (INDEPENDENT_AMBULATORY_CARE_PROVIDER_SITE_OTHER): Payer: Medicare Other | Admitting: Orthopedic Surgery

## 2020-10-14 VITALS — BP 141/76 | HR 62 | Ht 64.0 in | Wt 133.8 lb

## 2020-10-14 DIAGNOSIS — M25552 Pain in left hip: Secondary | ICD-10-CM

## 2020-10-14 NOTE — Progress Notes (Signed)
New Patient Visit  Assessment: Tanya [name] is a 70 y.o. female with the following: Lumbar back pain; radiculopathy into posterior left leg  Plan: Tanya [name] has lower back pain with some radiating pain into her left leg.  Her description of her pain is most consistent with her lumbar spine.  There are no concerning signs on exam.  Her pain is controlled with occasional Tylenol.  She has excellent strength.  I have recommended PT for her back.  If this does not improve her symptoms, we may have to consider an MRI or referral to a back specialist.   Follow-up: Return if symptoms worsen or fail to improve.  Subjective:  Chief Complaint  Patient presents with  . Hip Pain    Left hip, Patient reports a little pain,     History of Present Illness: Tanya [name] is a 71 y.o. female who has been referred to clinic today about Tanya Dow, MD for evaluation of left hip pain.  She has been having left-sided pain in her buttock for the past 4 months.  The pain radiates from her buttock and down the posterior aspect of her left leg into her calf.  This does not get worse with standing.  No specific injury.  She has not done any physical therapy.  She will occasionally take Tylenol for pain, but this does not happen very often.  No bowel or bladder dysfunction.  She denies numbness and tingling.  She does not have issues with falling.   Review of Systems: No fevers or chills No numbness or tingling No chest pain No shortness of breath No bowel or bladder dysfunction No GI distress No headaches   Medical History:  Past Medical History:  Diagnosis Date  . 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 Anhedonia   . Anxiety   . CAD (coronary artery disease)    a. s/p DESx2 to proximal LAD in 06/2018 b. cath in 04/2019 showing 40% mid-LAD ISR with no significant stenosis along LM, LCx, or RCA  . Cystitis   . Elevated liver enzymes   . Fatty liver   . GERD (gastroesophageal reflux  disease)   . Headache    migraine  . Hyperlipidemia   . Hypertension   . Insomnia   . Osteoarthritis   . Unspecified hypothyroidism   . Vaginitis   . Wears glasses   . Wears hearing aid    B/L    Past Surgical History:  Procedure Laterality Date  . CATARACT EXTRACTION W/PHACO Right 05/08/2019   Procedure: CATARACT EXTRACTION PHACO AND INTRAOCULAR LENS PLACEMENT (IOC);  Surgeon: Baruch Goldmann, MD;  [facility]: [facility];  Service: Ophthalmology;  Laterality: Right;  CDE: 10.02  . COLONOSCOPY N/A 02/08/2016   Procedure: COLONOSCOPY;  Surgeon: [name], MD;  [facility];  Service: Endoscopy;  Laterality: N/A;  1200 - moved to 11:45 - Ann to notify  . CORONARY STENT INTERVENTION N/A 06/06/2018   Procedure: CORONARY STENT INTERVENTION;  Surgeon: [name], [name], MD;  Location: [place] [facility];  Service: Cardiovascular;  Laterality: N/A;  . INTRAVASCULAR PRESSURE WIRE/FFR STUDY N/A 06/06/2018   Procedure: INTRAVASCULAR PRESSURE WIRE/FFR STUDY;  Surgeon: [name], [name], MD;  Location: [place] [facility];  Service: Cardiovascular;  Laterality: N/A;  . IR ANGIO INTRA EXTRACRAN SEL COM CAROTID INNOMINATE BILAT MOD SED  09/01/2019  . IR ANGIO INTRA EXTRACRAN SEL INTERNAL CAROTID UNI L MOD SED  12/21/2019  . IR  ANGIO VERTEBRAL SEL VERTEBRAL BILAT MOD SED  09/01/2019  . IR ANGIOGRAM FOLLOW UP STUDY  12/21/2019  . IR TRANSCATH/EMBOLIZ  12/21/2019  . LEFT HEART CATH AND CORONARY ANGIOGRAPHY N/A 04/24/2019   Procedure: LEFT HEART CATH AND CORONARY ANGIOGRAPHY;  Surgeon: Belva Crome, MD;  Location: [place] [facility];  Service: Cardiovascular;  Laterality: N/A;  . LEFT HEART CATHETERIZATION WITH CORONARY ANGIOGRAM N/A 11/19/2014   Procedure: LEFT HEART CATHETERIZATION WITH CORONARY ANGIOGRAM;  Surgeon: [name], MD;  Location: [facility] CATH LAB;  Service: Cardiovascular;  Laterality: N/A;  . RADIOLOGY WITH ANESTHESIA N/A 11/23/2019   Procedure: Drucilla Schmidt;  Surgeon: Luanne Bras, MD;   Location: [place];  Service: Radiology;  Laterality: N/A;  . RADIOLOGY WITH ANESTHESIA N/A 12/21/2019   Procedure: EMBOLIZATION;  Surgeon: Luanne Bras, MD;  Location: [place];  Service: Radiology;  Laterality: N/A;  . RIGHT/LEFT HEART CATH AND CORONARY ANGIOGRAPHY N/A 06/06/2018   Procedure: RIGHT/LEFT HEART CATH AND CORONARY ANGIOGRAPHY;  Surgeon: [name], [name], MD;  Location: [place] [facility] LAB;  Service: Cardiovascular;  Laterality: N/A;    Family History  Problem Relation Age of Onset  . Dementia Mother   . Heart disease Mother        heart attack   . Hypertension Mother   . Heart attack Father   . Heart disease Father        heart attack   . Hypertension Father    Social History   Tobacco Use  . Smoking status: Never Smoker  . Smokeless tobacco: Never Used  Vaping Use  . Vaping Use: Never used  Substance Use Topics  . Alcohol use: No    Alcohol/week: 0.0 standard drinks  . Drug use: No    No Known Allergies  Current Meds  Medication Sig  . acetaminophen (TYLENOL) 500 MG tablet Take 1,000 mg by mouth every 6 (six) hours as needed for moderate pain.  Marland Kitchen amLODipine (NORVASC) 5 MG tablet TAKE 1 TABLET BY MOUTH  DAILY  . busPIRone (BUSPAR) 5 MG tablet buspirone 5 mg tablet  . cloNIDine (CATAPRES) 0.2 MG tablet TAKE 1 TABLET BY MOUTH AT  BEDTIME FOR BLOOD PRESSURE  . clopidogrel (PLAVIX) 75 MG tablet TAKE 1 TABLET BY MOUTH  DAILY  . escitalopram (LEXAPRO) 10 MG tablet escitalopram 10 mg tablet  TAKE 1 TABLET BY MOUTH ONCE DAILY  . metoprolol succinate (TOPROL-XL) 25 MG 24 hr tablet TAKE ONE-HALF TABLET BY  MOUTH DAILY  . nitroGLYCERIN (NITROSTAT) 0.4 MG SL tablet Place 1 tablet (0.4 mg total) under the tongue every 5 (five) minutes as needed. (Patient taking differently: Place 0.4 mg under the tongue every 5 (five) minutes x 3 doses as needed for chest pain.)  . OXcarbazepine (TRILEPTAL) 150 MG tablet Take 300 mg by mouth 2 (two) times daily.  . pantoprazole (PROTONIX)  40 MG tablet Take 1 tablet (40 mg total) by mouth daily.  Marland Kitchen PARoxetine (PAXIL) 10 MG tablet Take 1 tablet (10 mg total) by mouth daily.  . potassium chloride SA (KLOR-CON) 20 MEQ tablet Take 1 tablet (20 mEq total) by mouth daily.  . prednisoLONE acetate (PRED FORTE) 1 % ophthalmic suspension prednisolone acetate 1 % eye drops,suspension  INSTILL 1 DROP INTO RIGHT EYE EVERY 8 HOURS SHAKE WELL  . rosuvastatin (CRESTOR) 40 MG tablet TAKE 1 TABLET BY MOUTH  DAILY (Patient taking differently: Take 40 mg by mouth daily.)  . Simethicone 80 MG TABS Take 1 tablet (80 mg total) by  mouth 2 (two) times daily as needed.  . temazepam (RESTORIL) 7.5 MG capsule temazepam 7.5 mg capsule  TAKE 1 CAPSULE BY MOUTH AT BEDTIME AS NEEDED FOR SLEEP  . topiramate (TOPAMAX) 25 MG tablet Take 25 mg by mouth 2 (two) times daily.    Objective: BP (!) 141/76   Pulse 62   Ht 5\' 4"  (1.626 m)   Wt 133 lb 12.8 oz (60.7 kg)   BMI 22.97 kg/m   Physical Exam:  General:  Alert and oriented, no acute distress Gait: Normal  No tenderness to palpation in the lower back Strength is 5/5 in her LLE. Negative SLR Sensation intact throughout LLE 3+ patellar and Achilles reflex bilaterally    IMAGING: I personally ordered and reviewed the following images   Left hip XR demonstrates minimal degenerative changes.  No evidence of acute injury.  Femoral head without AVN or flattening, well seated within the acetabulum.  Impression: mild degenerative changes left hip   New Medications:  No orders of the defined types were placed in this encounter.     Mordecai Rasmussen, MD  10/14/2020 10:59 AM

## 2020-10-24 ENCOUNTER — Telehealth: Payer: Self-pay

## 2020-10-24 NOTE — Telephone Encounter (Signed)
Patient needing refill on clopidogrel 75 mg tablets 90 day supply sent to [facility] in [place] not [facility] please ph# [phone]

## 2020-10-25 ENCOUNTER — Other Ambulatory Visit: Payer: Self-pay

## 2020-10-25 ENCOUNTER — Other Ambulatory Visit: Payer: Self-pay | Admitting: *Deleted

## 2020-10-25 MED ORDER — CLOPIDOGREL BISULFATE 75 MG PO TABS: 75.0000 mg | ORAL_TABLET | Freq: Every day | ORAL | 3 refills | Status: DC

## 2020-10-25 MED ORDER — PAROXETINE HCL 10 MG PO TABS: 10.0000 mg | ORAL_TABLET | Freq: Every day | ORAL | 5 refills | Status: DC

## 2020-10-25 MED ORDER — POTASSIUM CHLORIDE CRYS ER 20 MEQ PO TBCR: 20.0000 meq | EXTENDED_RELEASE_TABLET | Freq: Every day | ORAL | 3 refills | Status: DC

## 2020-10-25 NOTE — Telephone Encounter (Signed)
Rx sent in

## 2020-11-03 ENCOUNTER — Other Ambulatory Visit (HOSPITAL_COMMUNITY): Payer: Self-pay | Admitting: Family Medicine

## 2020-11-03 DIAGNOSIS — Z1231 Encounter for screening mammogram for malignant neoplasm of breast: Secondary | ICD-10-CM

## 2020-11-08 ENCOUNTER — Other Ambulatory Visit: Payer: Self-pay | Admitting: Family Medicine

## 2020-11-10 DIAGNOSIS — H26491 Other secondary cataract, right eye: Secondary | ICD-10-CM | POA: Diagnosis not present

## 2020-11-10 DIAGNOSIS — H11823 Conjunctivochalasis, bilateral: Secondary | ICD-10-CM | POA: Diagnosis not present

## 2020-11-10 DIAGNOSIS — H01001 Unspecified blepharitis right upper eyelid: Secondary | ICD-10-CM | POA: Diagnosis not present

## 2020-11-10 DIAGNOSIS — H01002 Unspecified blepharitis right lower eyelid: Secondary | ICD-10-CM | POA: Diagnosis not present

## 2020-11-11 ENCOUNTER — Ambulatory Visit (HOSPITAL_COMMUNITY): Payer: PRIVATE HEALTH INSURANCE

## 2020-11-14 NOTE — Progress Notes (Signed)
Cardiology Office Note    Date:  11/16/2020   ID:  Tanya [name], DOB [birth year], MRN [id number]  PCP:  Lindell Spar, MD  Cardiologist: No primary care provider on file. EPS: None  No chief complaint on file.   History of Present Illness:  Tanya [name] is a 70 y.o. female CAD s/p DESx2 to proximal LAD in 06/2018 with cath in 04/2019 showing 40% mid-LAD ISR with no significant stenosis along LM, LCx, or RCA. LVEDP 10, HTN, HLD, chronic DOE followed by Pulmonary   LOV Dr. [name] 07/2020 she was having infrequent chest pains at times, right sided, chronic ongoing SOB unchanged and left calf pain with walking-ABI's normal. Has had recent GI complaints per pulm and PCP notes and started on pantoprazole.  Patient comes in for f/u after husband walked in saying she needed to be seen for SOB and chest pain.She is upset because her BP is high today. Doesn't check often at home but when she does BP usually about 701 systolic. No extra salt. Complains of epigastric pain around right abdomen and having a lot of indigestion. Hasn't seen GIChronic shortness of breath but seems to be improving. Walks on treadmill 30 min daily.No chest pain.    Past Medical History:  Diagnosis Date  . 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 Anhedonia   . Anxiety   . CAD (coronary artery disease)    a. s/p DESx2 to proximal LAD in 06/2018 b. cath in 04/2019 showing 40% mid-LAD ISR with no significant stenosis along LM, LCx, or RCA  . Cystitis   . Elevated liver enzymes   . Fatty liver   . GERD (gastroesophageal reflux disease)   . Headache    migraine  . Hyperlipidemia   . Hypertension   . Insomnia   . Osteoarthritis   . Unspecified hypothyroidism   . Vaginitis   . Wears glasses   . Wears hearing aid    B/L    Past Surgical History:  Procedure Laterality Date  . CATARACT EXTRACTION W/PHACO Right 05/08/2019   Procedure: CATARACT EXTRACTION PHACO AND INTRAOCULAR LENS PLACEMENT (IOC);  Surgeon:  Baruch Goldmann, MD;  Location: [facility];  Service: Ophthalmology;  Laterality: Right;  CDE: 10.02  . COLONOSCOPY N/A 02/08/2016   Procedure: COLONOSCOPY;  Surgeon: [name], MD;  Location: [facility];  Service: Endoscopy;  Laterality: N/A;  1200 - moved to 11:45 - Ann to notify  . CORONARY STENT INTERVENTION N/A 06/06/2018   Procedure: CORONARY STENT INTERVENTION;  Surgeon: [name], [name], MD;  Location: [place] [facility];  Service: Cardiovascular;  Laterality: N/A;  . INTRAVASCULAR PRESSURE WIRE/FFR STUDY N/A 06/06/2018   Procedure: INTRAVASCULAR PRESSURE WIRE/FFR STUDY;  Surgeon: [name], [name], MD;  Location: [place] [facility];  Service: Cardiovascular;  Laterality: N/A;  . IR ANGIO INTRA EXTRACRAN SEL COM CAROTID INNOMINATE BILAT MOD SED  09/01/2019  . IR ANGIO INTRA EXTRACRAN SEL INTERNAL CAROTID UNI L MOD SED  12/21/2019  . IR ANGIO VERTEBRAL SEL VERTEBRAL BILAT MOD SED  09/01/2019  . IR ANGIOGRAM FOLLOW UP STUDY  12/21/2019  . IR TRANSCATH/EMBOLIZ  12/21/2019  . LEFT HEART CATH AND CORONARY ANGIOGRAPHY N/A 04/24/2019   Procedure: LEFT HEART CATH AND CORONARY ANGIOGRAPHY;  Surgeon: Belva Crome, MD;  Location: [place] [facility];  Service: Cardiovascular;  Laterality: N/A;  . LEFT HEART CATHETERIZATION WITH CORONARY ANGIOGRAM N/A 11/19/2014   Procedure: LEFT HEART CATHETERIZATION WITH CORONARY  Cyril Loosen;  Surgeon: [name], MD;  [facility];  Service: Cardiovascular;  Laterality: N/A;  . RADIOLOGY WITH ANESTHESIA N/A 11/23/2019   Procedure: Drucilla Schmidt;  Surgeon: Luanne Bras, MD;  Location: [place];  Service: Radiology;  Laterality: N/A;  . RADIOLOGY WITH ANESTHESIA N/A 12/21/2019   Procedure: EMBOLIZATION;  Surgeon: Luanne Bras, MD;  Location: [place];  Service: Radiology;  Laterality: N/A;  . RIGHT/LEFT HEART CATH AND CORONARY ANGIOGRAPHY N/A 06/06/2018   Procedure: RIGHT/LEFT HEART CATH AND CORONARY ANGIOGRAPHY;  Surgeon: [name], [name], MD;  Location: [place] [facility];  Service: Cardiovascular;  Laterality: N/A;    Current Medications: Current Meds  Medication Sig  . acetaminophen (TYLENOL) 500 MG tablet Take 1,000 mg by mouth every 6 (six) hours as needed for moderate pain.  Marland Kitchen amLODipine (NORVASC) 5 MG tablet TAKE 1 TABLET BY MOUTH  DAILY  . busPIRone (BUSPAR) 5 MG tablet buspirone 5 mg tablet  . clopidogrel (PLAVIX) 75 MG tablet Take 1 tablet (75 mg total) by mouth daily.  Marland Kitchen escitalopram (LEXAPRO) 10 MG tablet escitalopram 10 mg tablet  TAKE 1 TABLET BY MOUTH ONCE DAILY  . metoprolol succinate (TOPROL-XL) 25 MG 24 hr tablet TAKE ONE-HALF TABLET BY  MOUTH DAILY  . nitroGLYCERIN (NITROSTAT) 0.4 MG SL tablet Place 1 tablet (0.4 mg total) under the tongue every 5 (five) minutes as needed.  . OXcarbazepine (TRILEPTAL) 150 MG tablet Take 300 mg by mouth 2 (two) times daily.  . pantoprazole (PROTONIX) 40 MG tablet Take 1 tablet (40 mg total) by mouth daily.  Marland Kitchen PARoxetine (PAXIL) 10 MG tablet Take 1 tablet (10 mg total) by mouth daily.  . potassium chloride SA (KLOR-CON) 20 MEQ tablet Take 1 tablet (20 mEq total) by mouth daily.  . prednisoLONE acetate (PRED FORTE) 1 % ophthalmic suspension prednisolone acetate 1 % eye drops,suspension  INSTILL 1 DROP INTO RIGHT EYE EVERY 8 HOURS SHAKE WELL  . rosuvastatin (CRESTOR) 40 MG tablet TAKE 1 TABLET BY MOUTH  DAILY  . Simethicone 80 MG TABS Take 1 tablet (80 mg total) by mouth 2 (two) times daily as needed.  . temazepam (RESTORIL) 7.5 MG capsule temazepam 7.5 mg capsule  TAKE 1 CAPSULE BY MOUTH AT BEDTIME AS NEEDED FOR SLEEP  . topiramate (TOPAMAX) 25 MG tablet Take 25 mg by mouth 2 (two) times daily.  . [DISCONTINUED] cloNIDine (CATAPRES) 0.2 MG tablet TAKE 1 TABLET BY MOUTH AT  BEDTIME FOR BLOOD PRESSURE     Allergies:   Patient has no known allergies.   Social History   Socioeconomic History  . Marital status: Married    Spouse name: Not on file  . Number of children: Not on file  . Years  of education: Not on file  . Highest education level: Not on file  Occupational History  . Not on file  Tobacco Use  . Smoking status: Never Smoker  . Smokeless tobacco: Never Used  Vaping Use  . Vaping Use: Never used  Substance and Sexual Activity  . Alcohol use: No    Alcohol/week: 0.0 standard drinks  . Drug use: No  . Sexual activity: Not on file  Other Topics Concern  . Not on file  Social History Narrative   Pt originally from the [place] been in [place] for 20 years     Social Determinants of Radio broadcast assistant Strain: Not on file  Food Insecurity: Not on file  Transportation Needs: Not on file  Physical  Activity: Not on file  Stress: Not on file  Social Connections: Not on file     Family History:  The patient's family history includes Dementia in her mother; Heart attack in her father; Heart disease in her father and mother; Hypertension in her father and mother.   ROS:   Please see the history of present illness.    ROS All other systems reviewed and are negative.   PHYSICAL EXAM:   VS:  BP (!) 168/80   Pulse 61   Ht 5\' 4"  (1.626 m)   Wt 138 lb 6.4 oz (62.8 kg)   SpO2 98%   BMI 23.76 kg/m   Physical Exam  GEN: Well nourished, well developed, in no acute distress  Neck: no JVD, carotid bruits, or masses Cardiac:RRR; no murmurs, rubs, or gallops  Respiratory:  clear to auscultation bilaterally, normal work of breathing GI: soft, nontender, nondistended, + BS Ext: without cyanosis, clubbing, or edema, Good distal pulses bilaterally Neuro:  Alert and Oriented x 3, Psych: anxious, full affect  Wt Readings from Last 3 Encounters:  11/16/20 138 lb 6.4 oz (62.8 kg)  10/14/20 133 lb 12.8 oz (60.7 kg)  10/03/20 137 lb 1.9 oz (62.2 kg)      Studies/Labs Reviewed:   EKG:  EKG is  ordered today.  The ekg ordered today demonstrates NSR normal EKG  Recent Labs: 01/27/2020: ALT 13 03/28/2020: BUN 15; Creatinine, Ser 0.57; Hemoglobin 13.2; Platelets  302; Potassium 3.8; Sodium 143   Lipid Panel    Component Value Date/Time   CHOL 120 01/27/2020 1022   TRIG 80 01/27/2020 1022   HDL 60 01/27/2020 1022   CHOLHDL 2.0 01/27/2020 1022   VLDL 11 06/29/2019 0946   LDLCALC 44 01/27/2020 1022    Additional studies/ records that were reviewed today include:  ABI's 08/18/20 Summary:  Right: Resting right ankle-brachial index is within normal range. No  evidence of significant right lower extremity arterial disease. The right  toe-brachial index is normal.   Left: Resting left ankle-brachial index is within normal range. No  evidence of significant left lower extremity arterial disease. The left  toe-brachial index is normal.   Cardiac Catheterization: 04/2019  40% eccentric mid LAD in-stent restenosis.  At completion of stent procedure in September 2020, there was residual stent deformity in this region.  There is no high-grade obstruction in the LAD stent.  The LAD is otherwise normal.  Normal left main  Normal circumflex  Normal RCA  Normal LV systolic function and end-diastolic pressure   RECOMMENDATIONS:    Dyspnea cannot be explained by findings on today's study based on absence of significant coronary obstruction and normal LV hemodynamics.  Continue dual antiplatelet therapy beyond 12 months due to overlapping stents in the LAD.   Echocardiogram: 06/2019 IMPRESSIONS     1. Left ventricular ejection fraction, by visual estimation, is 55 to  60%. The left ventricle has normal function. There is no left ventricular  hypertrophy.   2. Left ventricular diastolic Doppler parameters are consistent with  impaired relaxation pattern of LV diastolic filling.   3. Global right ventricle has normal systolic function.The right  ventricular size is normal. No increase in right ventricular wall  thickness.   4. Left atrial size was normal.   5. Right atrial size was normal.   6. The mitral valve is normal in structure. Trace  mitral valve  regurgitation. No evidence of mitral stenosis.   7. The tricuspid valve is normal in structure.  Tricuspid valve  regurgitation was not visualized by color flow Doppler.   8. The aortic valve is tricuspid Aortic valve regurgitation was not  visualized by color flow Doppler. Mild aortic valve sclerosis without  stenosis.   9. The pulmonic valve was not well visualized. Pulmonic valve  regurgitation is not visualized by color flow Doppler.  10. Normal pulmonary artery systolic pressure.  11. The inferior vena cava IVC is small, suggesting low RA pressure and  hypovolemia.    Long-term Monitor: 01/2020  Sinus rhythm with rare PACs and PVCs and short bursts of SVT (possibly atrial tachycardia).      Risk Assessment/Calculations:         ASSESSMENT:    1. CAD in native artery   2. SOB (shortness of breath)   3. Essential hypertension   4. Hyperlipidemia, unspecified hyperlipidemia type   5. Pain of upper abdomen   6. Gastroesophageal reflux disease, unspecified whether esophagitis present      PLAN:  In order of problems listed above:  CAD s/p DESx2 to proximal LAD in 06/2018 with cath in 04/2019 showing 40% mid-LAD ISR with no significant stenosis along LM, LCx, or RCA. LVEDP 10. No chest pain and normal EKG.  DOE Chronic followed by Pulm and started on Protonix for GERD. Walking on treadmill 30 min daily.  HTN BP high today. Patient very anxious. Increase clonidine 0.3 mg qHS and have her monitor more closely at home  HLD-on Crest[place] LDL 44 12/2019  GERD and abdominal pain-main complaint, not eating much. Refer to GI  Shared Decision Making/Informed Consent        Medication Adjustments/Labs and Tests Ordered: Current medicines are reviewed at length with the patient today.  Concerns regarding medicines are outlined above.  Medication changes, Labs and Tests ordered today are listed in the Patient Instructions below. There are no Patient Instructions on  file for this visit.   Sumner Boast, PA-C  11/16/2020 9:08 AM    [facility] [place], [place], [place]  [address] Phone: [phone]; Fax: [phone]

## 2020-11-16 ENCOUNTER — Encounter: Payer: Self-pay | Admitting: Physician Assistant

## 2020-11-16 ENCOUNTER — Ambulatory Visit: Payer: Medicare Other | Admitting: Physician Assistant

## 2020-11-16 VITALS — BP 168/80 | HR 61 | Ht 64.0 in | Wt 138.4 lb

## 2020-11-16 DIAGNOSIS — E785 Hyperlipidemia, unspecified: Secondary | ICD-10-CM

## 2020-11-16 DIAGNOSIS — R0602 Shortness of breath: Secondary | ICD-10-CM

## 2020-11-16 DIAGNOSIS — I2510 Atherosclerotic heart disease of native coronary artery without angina pectoris: Secondary | ICD-10-CM

## 2020-11-16 DIAGNOSIS — R1010 Upper abdominal pain, unspecified: Secondary | ICD-10-CM | POA: Diagnosis not present

## 2020-11-16 DIAGNOSIS — I10 Essential (primary) hypertension: Secondary | ICD-10-CM | POA: Diagnosis not present

## 2020-11-16 DIAGNOSIS — K219 Gastro-esophageal reflux disease without esophagitis: Secondary | ICD-10-CM

## 2020-11-16 MED ORDER — CLONIDINE HCL 0.3 MG PO TABS: 0.3000 mg | ORAL_TABLET | Freq: Every day | ORAL | 3 refills | Status: DC

## 2020-11-16 NOTE — Patient Instructions (Addendum)
Medication Instructions:  Increase the clonidine to 0.3 mg, take one tablet by mouth at bedtime. *If you need a refill on your cardiac medications before your next appointment, please call your pharmacy*   Lab Work: None If you have labs (blood work) drawn today and your tests are completely normal, you will receive your results only by: Marland Kitchen MyChart Message (if you have MyChart) OR . A paper copy in the mail If you have any lab test that is abnormal or we need to change your treatment, we will call you to review the results.   Testing/Procedures: None   Follow-Up: At [facility], you and your health needs are our priority.  As part of our continuing mission to provide you with exceptional heart care, we have created designated Provider Care Teams.  These Care Teams include your primary Cardiologist (physician) and Advanced Practice Providers (APPs -  Physician Assistants and Nurse Practitioners) who all work together to provide you with the care you need, when you need it.  We recommend signing up for the patient portal called "MyChart".  Sign up information is provided on this After Visit Summary.  MyChart is used to connect with patients for Virtual Visits (Telemedicine).  Patients are able to view lab/test results, encounter notes, upcoming appointments, etc.  Non-urgent messages can be sent to your provider as well.   To learn more about what you can do with MyChart, go to [email or web address].    Your next appointment:   January 19, 2021  The format for your next appointment:   In Person  Provider:   [name], MD   Other Instructions 1.You have been referred to Gastroenterology, [facility] 2.Check your blood pressure at home and notify our office if it remains elevated

## 2020-11-17 ENCOUNTER — Encounter (INDEPENDENT_AMBULATORY_CARE_PROVIDER_SITE_OTHER): Payer: Self-pay | Admitting: *Deleted

## 2020-11-17 NOTE — Addendum Note (Signed)
Addended by: Julian Hy T on: 11/17/2020 12:17 PM   Modules accepted: Orders

## 2020-11-22 ENCOUNTER — Telehealth: Payer: Self-pay | Admitting: Cardiology

## 2020-11-22 MED ORDER — METOPROLOL SUCCINATE ER 25 MG PO TB24: 12.5000 mg | ORAL_TABLET | Freq: Every day | ORAL | 3 refills | Status: DC

## 2020-11-22 NOTE — Telephone Encounter (Signed)
New message   Patient husband stopped in said that the office changed her medication to a whole pill a day , but a new prescription wasn't sent in ?   *STAT* If patient is at the pharmacy, call can be transferred to refill team.   1. Which medications need to be refilled? (please list name of each medication and dose if known) metoprolol succinate (TOPROL-XL) 25 MG 24 hr tablet  2. Which pharmacy/location (including street and city if local pharmacy) is medication to be sent to? [facility] in [place]   3. Do they need a 30 day or 90 day supply? [place]

## 2020-11-22 NOTE — Telephone Encounter (Signed)
Medication refill request approved and sent to [facility] in [place].

## 2020-11-23 ENCOUNTER — Telehealth: Payer: Self-pay | Admitting: Cardiology

## 2020-11-23 ENCOUNTER — Other Ambulatory Visit: Payer: Self-pay

## 2020-11-23 MED ORDER — CLONIDINE HCL 0.3 MG PO TABS: 0.3000 mg | ORAL_TABLET | Freq: Every day | ORAL | 3 refills | Status: DC

## 2020-11-23 NOTE — Telephone Encounter (Signed)
Per patients last OV note with M. Lenze, PA-C, Toprol- XL 25 mg tablets were not increased.

## 2020-11-23 NOTE — Telephone Encounter (Signed)
Mr Noberto Retort stopped by the office again this morning about patients medication metoprolol succinate (TOPROL-XL) 25 MG 24 hr tablet , he states that her medication was increased from 12.5 to 25mg  at her last appointment and needs new prescription sent to [facility] stating the change.

## 2020-11-23 NOTE — Telephone Encounter (Signed)
Medication refill request approved.

## 2020-11-25 NOTE — Telephone Encounter (Signed)
From notes toprol is still 12.5mg  daily. Clonidine was increased at that visit, is she confusing that increase for toprol  Zandra Abts MD

## 2020-11-28 NOTE — Telephone Encounter (Signed)
Spoke to patients spouse who states that patient did confuse the medications Troprol and cloinidine with one another and we have resolved the situation.

## 2020-12-06 DIAGNOSIS — M542 Cervicalgia: Secondary | ICD-10-CM | POA: Diagnosis not present

## 2020-12-06 DIAGNOSIS — G4485 Primary stabbing headache: Secondary | ICD-10-CM | POA: Diagnosis not present

## 2020-12-06 DIAGNOSIS — G500 Trigeminal neuralgia: Secondary | ICD-10-CM | POA: Insufficient documentation

## 2020-12-06 DIAGNOSIS — G909 Disorder of the autonomic nervous system, unspecified: Secondary | ICD-10-CM | POA: Diagnosis not present

## 2020-12-06 DIAGNOSIS — G4701 Insomnia due to medical condition: Secondary | ICD-10-CM | POA: Diagnosis not present

## 2020-12-06 DIAGNOSIS — G43701 Chronic migraine without aura, not intractable, with status migrainosus: Secondary | ICD-10-CM | POA: Diagnosis not present

## 2020-12-06 DIAGNOSIS — Z79899 Other long term (current) drug therapy: Secondary | ICD-10-CM | POA: Diagnosis not present

## 2020-12-07 ENCOUNTER — Other Ambulatory Visit: Payer: Self-pay

## 2020-12-07 ENCOUNTER — Ambulatory Visit (HOSPITAL_COMMUNITY)
Admission: RE | Admit: 2020-12-07 | Discharge: 2020-12-07 | Disposition: A | Discharge status: Home / Self Care | Payer: Medicare Other | Source: Ambulatory Visit | Attending: Family Medicine | Admitting: Family Medicine

## 2020-12-07 DIAGNOSIS — Z79899 Other long term (current) drug therapy: Secondary | ICD-10-CM | POA: Diagnosis not present

## 2020-12-07 DIAGNOSIS — Z1231 Encounter for screening mammogram for malignant neoplasm of breast: Secondary | ICD-10-CM | POA: Insufficient documentation

## 2020-12-07 NOTE — Telephone Encounter (Signed)
cancel

## 2020-12-12 ENCOUNTER — Telehealth: Payer: Self-pay

## 2020-12-12 ENCOUNTER — Other Ambulatory Visit: Payer: Self-pay | Admitting: *Deleted

## 2020-12-12 MED ORDER — POTASSIUM CHLORIDE CRYS ER 20 MEQ PO TBCR: 20.0000 meq | EXTENDED_RELEASE_TABLET | Freq: Every day | ORAL | 3 refills | Status: DC

## 2020-12-12 NOTE — Telephone Encounter (Signed)
Pt medication sent to pharmacy  

## 2020-12-12 NOTE — Telephone Encounter (Signed)
Patient walk in need med refiil  potassium chloride SA (KLOR-CON) 20 MEQ tablet  Pharmacy: [facility]

## 2020-12-23 ENCOUNTER — Telehealth: Payer: Self-pay

## 2020-12-23 ENCOUNTER — Other Ambulatory Visit: Payer: Self-pay | Admitting: Internal Medicine

## 2020-12-23 DIAGNOSIS — F5101 Primary insomnia: Secondary | ICD-10-CM

## 2020-12-23 MED ORDER — TEMAZEPAM 7.5 MG PO CAPS: ORAL_CAPSULE | ORAL | 5 refills | Status: DC

## 2020-12-23 NOTE — Telephone Encounter (Signed)
Refilled. Thanks!

## 2020-12-23 NOTE — Telephone Encounter (Signed)
Can this med be refilled electronically? She is out

## 2020-12-23 NOTE — Telephone Encounter (Signed)
temazepam (RESTORIL) 7.5 MG capsule Please call in this medication to the pharmacy she is out

## 2020-12-26 ENCOUNTER — Other Ambulatory Visit: Payer: Self-pay | Admitting: Internal Medicine

## 2020-12-26 DIAGNOSIS — F5101 Primary insomnia: Secondary | ICD-10-CM

## 2020-12-26 MED ORDER — TEMAZEPAM 15 MG PO CAPS: ORAL_CAPSULE | ORAL | 5 refills | Status: DC

## 2020-12-27 ENCOUNTER — Telehealth: Payer: Self-pay

## 2020-12-27 NOTE — Telephone Encounter (Signed)
Pt notified that right dose of medication had been sent in

## 2020-12-27 NOTE — Telephone Encounter (Signed)
Patient needs med refill wrong strength was sent in and patient did not pick up the 7.5 cost more. Needs 15 mg.  temazepam (RESTORIL) 15 MG capsule  Pharmacy [facility] [place]

## 2021-01-02 ENCOUNTER — Other Ambulatory Visit: Payer: Self-pay

## 2021-01-02 ENCOUNTER — Ambulatory Visit (INDEPENDENT_AMBULATORY_CARE_PROVIDER_SITE_OTHER): Payer: Medicare Other | Admitting: Family Medicine

## 2021-01-02 ENCOUNTER — Encounter: Payer: Self-pay | Admitting: Family Medicine

## 2021-01-02 VITALS — BP 162/67 | HR 65 | Resp 15 | Ht 64.0 in | Wt 135.1 lb

## 2021-01-02 DIAGNOSIS — E785 Hyperlipidemia, unspecified: Secondary | ICD-10-CM

## 2021-01-02 DIAGNOSIS — F419 Anxiety disorder, unspecified: Secondary | ICD-10-CM | POA: Diagnosis not present

## 2021-01-02 DIAGNOSIS — R42 Dizziness and giddiness: Secondary | ICD-10-CM

## 2021-01-02 DIAGNOSIS — I10 Essential (primary) hypertension: Secondary | ICD-10-CM | POA: Diagnosis not present

## 2021-01-02 MED ORDER — AMLODIPINE BESYLATE 10 MG PO TABS: 10.0000 mg | ORAL_TABLET | Freq: Every day | ORAL | 2 refills | Status: DC

## 2021-01-02 NOTE — Patient Instructions (Addendum)
F/un in office with MD in 8  weeks, re evaluate  Blood pressure, call if you need me sooner  New higher dose of amlodipine, 10 mg one daily, collect at [place] today, stop amlodipine 5 mg tablet, blood pressure still high  You are referred for carotid doppler  You are referred to rheumatology  Fasting lipid, cmp and eGFr,  TSH, cBC asap past due  It is important that you exercise regularly at least 30 minutes 5 times a week. If you develop chest pain, have severe difficulty breathing, or feel very tired, stop exercising immediately and seek medical attention

## 2021-01-02 NOTE — Progress Notes (Signed)
   Tanya [name]     MRN: [id number]      DOB: [birth year]   HPI Ms. Yoo is here for follow up and re-evaluation of chronic medical conditions, medication management and review of any available recent lab and radiology data.  Preventive health is updated, specifically  Cancer screening and Immunization.   Questions or concerns regarding consultations or procedures which the PT has had in the interim are  addressed. The PT denies any adverse reactions to current medications since the last visit.  C/o right sided headache with dizziness and light headedness over past 2 months C/o uncontrolled blood pressure  ROS Denies recent fever or chills. Denies sinus pressure, nasal congestion, ear pain or sore throat. Denies chest congestion, productive cough or wheezing. Denies chest pains, palpitations and leg swelling Denies abdominal pain, nausea, vomiting,diarrhea or constipation.   Denies dysuria, frequency, hesitancy or incontinence. Denies joint pain, swelling and limitation in mobility.  Denies uncontrolled depression, anxiety or insomnia. Denies skin break down or rash.   PE  BP (!) 162/67   Pulse 65   Resp 15   Ht 5\' 4"  (1.626 m)   Wt 135 lb 1.9 oz (61.3 kg)   SpO2 98%   BMI 23.19 kg/m   Patient alert and oriented and in no cardiopulmonary distress.  HEENT: No facial asymmetry, EOMI,     Neck supple .Bruit  Chest: Clear to auscultation bilaterally.  CVS: S1, S2 no murmurs, no S3.Regular rate.  ABD: Soft non tender.   Ext: No edema  MS: Adequate ROM spine, shoulders, hips and knees.  Skin: Intact, no ulcerations or rash noted.  Psych: Good eye contact, normal affect. Memory intact not anxious or depressed appearing.  CNS: CN 2-12 intact, power,  normal throughout.no focal deficits noted.   Assessment & Plan  Essential hypertension Uncontrolled, increase amlodipine to 10 mg DASH diet and commitment to daily physical activity for a minimum of 30 minutes  discussed and encouraged, as a part of hypertension management. The importance of attaining a healthy weight is also discussed.  BP/Weight 01/12/2021 01/02/2021 11/16/2020 10/14/2020 10/03/2020 09/28/2020 40/98/1191  Systolic BP 478 295 621 308 [phone]  Diastolic BP 79 67 80 76 78 80 75  Wt. (Lbs) 137 135.12 138.4 133.8 137.12 138 136.8  BMI 23.52 23.19 23.76 22.97 23.54 23.69 23.48       Light headed Reports recurrent lightheadedness carotid US  Hyperlipemia Hyperlipidemia:Low fat diet discussed and encouraged.   Lipid Panel  Lab Results  Component Value Date   CHOL 130 01/04/2021   HDL 67 01/04/2021   LDLCALC 50 01/04/2021   TRIG 63 01/04/2021   CHOLHDL 1.9 01/04/2021   Controlled, no change in medication     Anxiety Controlled, no change in medication

## 2021-01-04 DIAGNOSIS — I10 Essential (primary) hypertension: Secondary | ICD-10-CM | POA: Diagnosis not present

## 2021-01-04 DIAGNOSIS — E785 Hyperlipidemia, unspecified: Secondary | ICD-10-CM | POA: Diagnosis not present

## 2021-01-05 LAB — CBC
Hematocrit: 38.1 % (ref 34.0–46.6)
Hemoglobin: 12.6 g/dL (ref 11.1–15.9)
MCH: 31.3 pg (ref 26.6–33.0)
MCHC: 33.1 g/dL (ref 31.5–35.7)
MCV: 95 fL (ref 79–97)
Platelets: 287 10*3/uL (ref 150–450)
RBC: 4.03 x10E6/uL (ref 3.77–5.28)
RDW: 13.1 % (ref 11.7–15.4)
WBC: 3.9 10*3/uL (ref 3.4–10.8)

## 2021-01-05 LAB — CMP14+EGFR
ALT: 14 IU/L (ref 0–32)
AST: 24 IU/L (ref 0–40)
Albumin/Globulin Ratio: 1.9 (ref 1.2–2.2)
Albumin: 4.7 g/dL (ref 3.7–4.7)
Alkaline Phosphatase: 62 IU/L (ref 44–121)
BUN/Creatinine Ratio: 19 (ref 12–28)
BUN: 14 mg/dL (ref 8–27)
Bilirubin Total: 0.4 mg/dL (ref 0.0–1.2)
CO2: 20 mmol/L (ref 20–29)
Calcium: 9.8 mg/dL (ref 8.7–10.3)
Chloride: 108 mmol/L — ABNORMAL HIGH (ref 96–106)
Creatinine, Ser: 0.75 mg/dL (ref 0.57–1.00)
Globulin, Total: 2.5 g/dL (ref 1.5–4.5)
Glucose: 98 mg/dL (ref 65–99)
Potassium: 4.6 mmol/L (ref 3.5–5.2)
Sodium: 143 mmol/L (ref 134–144)
Total Protein: 7.2 g/dL (ref 6.0–8.5)
eGFR: 85 mL/min/{1.73_m2} (ref >59)

## 2021-01-05 LAB — LIPID PANEL
Chol/HDL Ratio: 1.9 ratio (ref 0.0–4.4)
Cholesterol, Total: 130 mg/dL (ref 100–199)
HDL: 67 mg/dL (ref >39)
LDL Chol Calc (NIH): 50 mg/dL (ref 0–99)
Triglycerides: 63 mg/dL (ref 0–149)
VLDL Cholesterol Cal: 13 mg/dL (ref 5–40)

## 2021-01-05 LAB — TSH: TSH: 1.88 u[IU]/mL (ref 0.450–4.500)

## 2021-01-09 ENCOUNTER — Ambulatory Visit (HOSPITAL_COMMUNITY): Payer: Medicare Other

## 2021-01-11 ENCOUNTER — Other Ambulatory Visit: Payer: Self-pay

## 2021-01-11 ENCOUNTER — Ambulatory Visit (HOSPITAL_COMMUNITY)
Admission: RE | Admit: 2021-01-11 | Discharge: 2021-01-11 | Disposition: A | Discharge status: Home / Self Care | Payer: Medicare Other | Source: Ambulatory Visit | Attending: Family Medicine | Admitting: Family Medicine

## 2021-01-11 DIAGNOSIS — I10 Essential (primary) hypertension: Secondary | ICD-10-CM | POA: Diagnosis not present

## 2021-01-11 DIAGNOSIS — E785 Hyperlipidemia, unspecified: Secondary | ICD-10-CM | POA: Diagnosis not present

## 2021-01-11 DIAGNOSIS — R55 Syncope and collapse: Secondary | ICD-10-CM | POA: Diagnosis not present

## 2021-01-11 DIAGNOSIS — R42 Dizziness and giddiness: Secondary | ICD-10-CM | POA: Diagnosis not present

## 2021-01-12 ENCOUNTER — Encounter (INDEPENDENT_AMBULATORY_CARE_PROVIDER_SITE_OTHER): Payer: Self-pay

## 2021-01-12 ENCOUNTER — Ambulatory Visit (INDEPENDENT_AMBULATORY_CARE_PROVIDER_SITE_OTHER): Payer: Medicare Other | Admitting: Gastroenterology

## 2021-01-12 ENCOUNTER — Other Ambulatory Visit (INDEPENDENT_AMBULATORY_CARE_PROVIDER_SITE_OTHER): Payer: Self-pay

## 2021-01-12 ENCOUNTER — Encounter (INDEPENDENT_AMBULATORY_CARE_PROVIDER_SITE_OTHER): Payer: Self-pay | Admitting: Gastroenterology

## 2021-01-12 VITALS — BP 145/79 | HR 68 | Temp 97.5°F | Ht 64.0 in | Wt 137.0 lb

## 2021-01-12 DIAGNOSIS — G8929 Other chronic pain: Secondary | ICD-10-CM | POA: Insufficient documentation

## 2021-01-12 DIAGNOSIS — R6881 Early satiety: Secondary | ICD-10-CM

## 2021-01-12 DIAGNOSIS — R1013 Epigastric pain: Secondary | ICD-10-CM | POA: Diagnosis not present

## 2021-01-12 DIAGNOSIS — K581 Irritable bowel syndrome with constipation: Secondary | ICD-10-CM | POA: Diagnosis not present

## 2021-01-12 DIAGNOSIS — R140 Abdominal distension (gaseous): Secondary | ICD-10-CM

## 2021-01-12 DIAGNOSIS — R12 Heartburn: Secondary | ICD-10-CM | POA: Diagnosis not present

## 2021-01-12 DIAGNOSIS — K589 Irritable bowel syndrome without diarrhea: Secondary | ICD-10-CM | POA: Insufficient documentation

## 2021-01-12 MED ORDER — OMEPRAZOLE 40 MG PO CPDR: 40.0000 mg | DELAYED_RELEASE_CAPSULE | Freq: Every day | ORAL | 3 refills | Status: DC

## 2021-01-12 NOTE — Progress Notes (Signed)
Tanya [name], M.D. Gastroenterology & Hepatology [facility] [address] [place], [place] [address] Primary Care Physician: Lindell Spar, MD [address] [place] [address]  Referring MD: PCP  Chief Complaint:  Abdominal pain and nausea.  History of Present Illness: Tanya [name] is a 70 y.o. female from [place] with [place] aneurysm surgery in 2020, CAD s/p stent placement 2 years ago,  who presents for evaluation of abdominal pain and nausea.  Patient reports that for the last 6 months she has presented some intermittent episodes of upper abdominal pain worse in the epigastric area, she cannot describe the characteristics of the pain but states that it is 7/10 in severity.  Sometimes she feels some pain in the R flank, which is intermittent but less frequent than the pain in the epigastric area. There are no triggers or alleviating factors for this. She also feels that she has constant nausea but does not vomit. She has also poor appetite and gets full easily. She has a bowel movement possibly 2-3 times a week, but it may increase if she eats vegetables. Also has bloating episodes intermittently but does not follow any type of diet.  She has a chronic history of heartburn, has been taking protonix 40 mg in the morning, after eating breakfast. She is having heartburn on a daily basis. Denies dysphagia or odynophagia.  The patient denies having any nausea, vomiting, fever, chills, hematochezia, melena, hematemesis, abdominal distention, abdominal pain, diarrhea, jaundice, pruritus or weight loss.  Last labs from 01/04/2021 showed normal CMP including liver enzymes, CBC showed white blood cell count of 3.9 with normal hemoglobin of 12.6 and platelets of 287.  TSH was 1.88.  Most recent imaging performed on 05/13/2015 - fatty liver, no other abnormalities.  Last EGD: 2011 - gastritis Last Colonoscopy:2017 -- One 4 mm polyp in  the cecum, removed with a cold snare. Path TA. - External hemorrhoids.  FHx: neg for any gastrointestinal/liver disease, no malignancies Social: neg smoking, alcohol or illicit drug use Surgical: no abdominal surgeries  Past Medical History: Past Medical History:  Diagnosis Date  . 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 Anhedonia   . Anxiety   . CAD (coronary artery disease)    a. s/p DESx2 to proximal LAD in 06/2018 b. cath in 04/2019 showing 40% mid-LAD ISR with no significant stenosis along LM, LCx, or RCA  . Cystitis   . Elevated liver enzymes   . Fatty liver   . GERD (gastroesophageal reflux disease)   . Headache    migraine  . Hyperlipidemia   . Hypertension   . Insomnia   . Osteoarthritis   . Unspecified hypothyroidism   . Vaginitis   . Wears glasses   . Wears hearing aid    B/L    Past Surgical History: Past Surgical History:  Procedure Laterality Date  . CATARACT EXTRACTION W/PHACO Right 05/08/2019   Procedure: CATARACT EXTRACTION PHACO AND INTRAOCULAR LENS PLACEMENT (IOC);  Surgeon: Baruch Goldmann, MD;  [facility];  Service: Ophthalmology;  Laterality: Right;  CDE: 10.02  . COLONOSCOPY N/A 02/08/2016   Procedure: COLONOSCOPY;  Surgeon: [name], MD;  [facility];  Service: Endoscopy;  Laterality: N/A;  1200 - moved to 11:45 - Ann to notify  . CORONARY STENT INTERVENTION N/A 06/06/2018   Procedure: CORONARY STENT INTERVENTION;  Surgeon: [name], [name], MD;  Location: [place] [facility];  Service: Cardiovascular;  Laterality: N/A;  . INTRAVASCULAR PRESSURE WIRE/FFR STUDY N/A 06/06/2018   Procedure: INTRAVASCULAR PRESSURE WIRE/FFR STUDY;  Surgeon: [name], [name], MD;  Location: [place] [facility];  Service: Cardiovascular;  Laterality: N/A;  . IR ANGIO INTRA EXTRACRAN SEL COM CAROTID INNOMINATE BILAT MOD SED  09/01/2019  . IR ANGIO INTRA EXTRACRAN SEL INTERNAL CAROTID UNI L MOD SED  12/21/2019  . IR ANGIO VERTEBRAL SEL VERTEBRAL BILAT MOD SED   09/01/2019  . IR ANGIOGRAM FOLLOW UP STUDY  12/21/2019  . IR TRANSCATH/EMBOLIZ  12/21/2019  . LEFT HEART CATH AND CORONARY ANGIOGRAPHY N/A 04/24/2019   Procedure: LEFT HEART CATH AND CORONARY ANGIOGRAPHY;  Surgeon: Belva Crome, MD;  Location: [place] [facility];  Service: Cardiovascular;  Laterality: N/A;  . LEFT HEART CATHETERIZATION WITH CORONARY ANGIOGRAM N/A 11/19/2014   Procedure: LEFT HEART CATHETERIZATION WITH CORONARY ANGIOGRAM;  Surgeon: [name], MD;  Location: [facility] LAB;  Service: Cardiovascular;  Laterality: N/A;  . RADIOLOGY WITH ANESTHESIA N/A 11/23/2019   Procedure: Drucilla Schmidt;  Surgeon: Luanne Bras, MD;  Location: [place];  Service: Radiology;  Laterality: N/A;  . RADIOLOGY WITH ANESTHESIA N/A 12/21/2019   Procedure: EMBOLIZATION;  Surgeon: Luanne Bras, MD;  Location: [place];  Service: Radiology;  Laterality: N/A;  . RIGHT/LEFT HEART CATH AND CORONARY ANGIOGRAPHY N/A 06/06/2018   Procedure: RIGHT/LEFT HEART CATH AND CORONARY ANGIOGRAPHY;  Surgeon: [name], [name], MD;  Location: [place] [facility];  Service: Cardiovascular;  Laterality: N/A;    Family History: Family History  Problem Relation Age of Onset  . Dementia Mother   . Heart disease Mother        heart attack   . Hypertension Mother   . Heart attack Father   . Heart disease Father        heart attack   . Hypertension Father     Social History: Social History   Tobacco Use  Smoking Status Never Smoker  Smokeless Tobacco Never Used   Social History   Substance and Sexual Activity  Alcohol Use No  . Alcohol/week: 0.0 standard drinks   Social History   Substance and Sexual Activity  Drug Use No    Allergies: No Known Allergies  Medications: Current Outpatient Medications  Medication Sig Dispense Refill  . acetaminophen (TYLENOL) 500 MG tablet Take 1,000 mg by mouth every 6 (six) hours as needed for moderate pain.    Marland Kitchen amLODipine (NORVASC) 10 MG tablet Take 1 tablet (10 mg  total) by mouth daily. 30 tablet 2  . aspirin EC 81 MG tablet Take 81 mg by mouth daily. Swallow whole.    . cloNIDine (CATAPRES) 0.3 MG tablet Take 1 tablet (0.3 mg total) by mouth at bedtime. 90 tablet 3  . clopidogrel (PLAVIX) 75 MG tablet Take 1 tablet (75 mg total) by mouth daily. 90 tablet 3  . escitalopram (LEXAPRO) 10 MG tablet escitalopram 10 mg tablet  TAKE 1 TABLET BY MOUTH ONCE DAILY    . metoprolol succinate (TOPROL-XL) 25 MG 24 hr tablet Take 0.5 tablets (12.5 mg total) by mouth daily. 45 tablet 3  . nitroGLYCERIN (NITROSTAT) 0.4 MG SL tablet Place 1 tablet (0.4 mg total) under the tongue every 5 (five) minutes as needed. 25 tablet 2  . pantoprazole (PROTONIX) 40 MG tablet Take 1 tablet (40 mg total) by mouth daily. 15 tablet 0  . PARoxetine (PAXIL) 10 MG tablet Take 1 tablet (10 mg total) by mouth daily. 30 tablet 5  . potassium  chloride SA (KLOR-CON) 20 MEQ tablet Take 1 tablet (20 mEq total) by mouth daily. 30 tablet 3  . rosuvastatin (CRESTOR) 40 MG tablet TAKE 1 TABLET BY MOUTH  DAILY 90 tablet 3  . temazepam (RESTORIL) 15 MG capsule TAKE 1 CAPSULE BY MOUTH AT BEDTIME AS NEEDED FOR SLEEP 30 capsule 5  . topiramate (TOPAMAX) 25 MG tablet Take 50 mg by mouth daily.    . busPIRone (BUSPAR) 5 MG tablet buspirone 5 mg tablet (Patient not taking: Reported on 01/12/2021)    . prednisoLONE acetate (PRED FORTE) 1 % ophthalmic suspension prednisolone acetate 1 % eye drops,suspension  INSTILL 1 DROP INTO RIGHT EYE EVERY 8 HOURS SHAKE WELL    . Simethicone 80 MG TABS Take 1 tablet (80 mg total) by mouth 2 (two) times daily as needed. (Patient not taking: Reported on 01/12/2021) 60 tablet 1   No current facility-administered medications for this visit.    Review of Systems: GENERAL: negative for malaise, night sweats HEENT: No changes in hearing or vision, no nose bleeds or other nasal problems. NECK: Negative for lumps, goiter, pain and significant neck swelling RESPIRATORY: Negative  for cough, wheezing CARDIOVASCULAR: Negative for chest pain, leg swelling, palpitations, orthopnea GI: SEE HPI MUSCULOSKELETAL: Negative for joint pain or swelling, back pain, and muscle pain. SKIN: Negative for lesions, rash PSYCH: Negative for sleep disturbance, mood disorder and recent psychosocial stressors. HEMATOLOGY Negative for prolonged bleeding, bruising easily, and swollen nodes. ENDOCRINE: Negative for cold or heat intolerance, polyuria, polydipsia and goiter. NEURO: negative for tremor, gait imbalance, syncope and seizures. The remainder of the review of systems is noncontributory.   Physical Exam: BP (!) 145/79 (BP Location: Left Arm, Patient Position: Sitting, Cuff Size: Large)   Pulse 68   Temp (!) 97.5 F (36.4 C) (Oral)   Ht 5\' 4"  (1.626 m)   Wt 137 lb (62.1 kg)   BMI 23.52 kg/m  GENERAL: The patient is AO x3, in no acute distress. HEENT: Head is normocephalic and atraumatic. EOMI are intact. Mouth is well hydrated and without lesions. NECK: Supple. No masses LUNGS: Clear to auscultation. No presence of rhonchi/wheezing/rales. Adequate chest expansion HEART: RRR, normal s1 and s2. ABDOMEN: mildly tender in epigastric area, no guarding, no peritoneal signs, and nondistended. BS +. No masses. EXTREMITIES: Without any cyanosis, clubbing, rash, lesions or edema. NEUROLOGIC: AOx3, no focal motor deficit. SKIN: no jaundice, no rashes   Imaging/Labs: as above  I personally reviewed and interpreted the available labs, imaging and endoscopic files.  Impression and Plan: Tanya [name] is a 70 y.o. female from [place] with [place] aneurysm surgery in 2020, CAD s/p stent placement 2 years ago,  who presents for evaluation of abdominal pain and nausea.  Patient has been taking onset of symptoms of abdominal pain along with bloating and constipation.  Patient has not presented any red flag signs.  Given her age, it would be important to evaluate this further with an EGD  with small bowel biopsies.  Patient may benefit from starting MiraLAX daily as well as improvement in the ED but endoscopic ultrasound for her symptoms may be related to IBS-C.  She will also implementing a low FODMAP diet.  As she is presenting recurrent heartburn despite taking pantoprazole, will attempt to treat with a PPI with higher potency such as omeprazole daily.  Patient understood and agreed.  -Schedule EGD with SB biopsy off Plavix, will obtain clearance - Start taking Miralax 1 cap every day for one week. If  bowel movements do not improve, increase to 1 cup every 12 hours. If after two weeks there is no improvement, increase to 1 cup every 8 hours - Eat prune and/or kiwi daily - Stop pantoprazole, start omeprazole 40 mg qday - Explained presumed etiology of IBS symptoms. Patient was counseled about the benefit of implementing a low FODMAP to improve symptoms and recurrent episodes. A dietary list was provided to the patient. Also, the patient was counseled about the benefit of avoiding stressing situations and potential environmental triggers leading to symptomatology. - Explained presumed etiology of reflux symptoms. Instruction provided in the use of antireflux medication - patient should take medication in the morning 30-45 minutes before eating breakfast. Discussed avoidance of eating within 2 hours of lying down to sleep and benefit of blocks to elevate head of bed.  All questions were answered.      Tanya Peppers, MD Gastroenterology and Hepatology [facility]

## 2021-01-12 NOTE — H&P (View-Only) (Signed)
[name], M.D. Gastroenterology & Hepatology [facility] [address] [place], [place] [address] Primary Care Physician: Lindell Spar, MD [address] [place] [address]  Referring MD: PCP  Chief Complaint:  Abdominal pain and nausea.  History of Present Illness: Tanya [name] is a 70 y.o. female from [place] with [place] aneurysm surgery in 2020, CAD s/p stent placement 2 years ago,  who presents for evaluation of abdominal pain and nausea.  Patient reports that for the last 6 months she has presented some intermittent episodes of upper abdominal pain worse in the epigastric area, she cannot describe the characteristics of the pain but states that it is 7/10 in severity.  Sometimes she feels some pain in the R flank, which is intermittent but less frequent than the pain in the epigastric area. There are no triggers or alleviating factors for this. She also feels that she has constant nausea but does not vomit. She has also poor appetite and gets full easily. She has a bowel movement possibly 2-3 times a week, but it may increase if she eats vegetables. Also has bloating episodes intermittently but does not follow any type of diet.  She has a chronic history of heartburn, has been taking protonix 40 mg in the morning, after eating breakfast. She is having heartburn on a daily basis. Denies dysphagia or odynophagia.  The patient denies having any nausea, vomiting, fever, chills, hematochezia, melena, hematemesis, abdominal distention, abdominal pain, diarrhea, jaundice, pruritus or weight loss.  Last labs from 01/04/2021 showed normal CMP including liver enzymes, CBC showed white blood cell count of 3.9 with normal hemoglobin of 12.6 and platelets of 287.  TSH was 1.88.  Most recent imaging performed on 05/13/2015 - fatty liver, no other abnormalities.  Last EGD: 2011 - gastritis Last Colonoscopy:2017 -- One 4 mm polyp in  the cecum, removed with a cold snare. Path TA. - External hemorrhoids.  FHx: neg for any gastrointestinal/liver disease, no malignancies Social: neg smoking, alcohol or illicit drug use Surgical: no abdominal surgeries  Past Medical History: Past Medical History:  Diagnosis Date  . 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 Anhedonia   . Anxiety   . CAD (coronary artery disease)    a. s/p DESx2 to proximal LAD in 06/2018 b. cath in 04/2019 showing 40% mid-LAD ISR with no significant stenosis along LM, LCx, or RCA  . Cystitis   . Elevated liver enzymes   . Fatty liver   . GERD (gastroesophageal reflux disease)   . Headache    migraine  . Hyperlipidemia   . Hypertension   . Insomnia   . Osteoarthritis   . Unspecified hypothyroidism   . Vaginitis   . Wears glasses   . Wears hearing aid    B/L    Past Surgical History: Past Surgical History:  Procedure Laterality Date  . CATARACT EXTRACTION W/PHACO Right 05/08/2019   Procedure: CATARACT EXTRACTION PHACO AND INTRAOCULAR LENS PLACEMENT (IOC);  Surgeon: Baruch Goldmann, MD;  [facility];  Service: Ophthalmology;  Laterality: Right;  CDE: 10.02  . COLONOSCOPY N/A 02/08/2016   Procedure: COLONOSCOPY;  Surgeon: [name], MD;  [facility];  Service: Endoscopy;  Laterality: N/A;  1200 - moved to 11:45 - Ann to notify  . CORONARY STENT INTERVENTION N/A 06/06/2018   Procedure: CORONARY STENT INTERVENTION;  Surgeon: [name], [name], MD;  Location: [place] [facility];  Service: Cardiovascular;  Laterality: N/A;  . INTRAVASCULAR PRESSURE WIRE/FFR STUDY N/A 06/06/2018   Procedure: INTRAVASCULAR PRESSURE WIRE/FFR STUDY;  Surgeon: [name], [name], MD;  Location: [place] [facility];  Service: Cardiovascular;  Laterality: N/A;  . IR ANGIO INTRA EXTRACRAN SEL COM CAROTID INNOMINATE BILAT MOD SED  09/01/2019  . IR ANGIO INTRA EXTRACRAN SEL INTERNAL CAROTID UNI L MOD SED  12/21/2019  . IR ANGIO VERTEBRAL SEL VERTEBRAL BILAT MOD SED   09/01/2019  . IR ANGIOGRAM FOLLOW UP STUDY  12/21/2019  . IR TRANSCATH/EMBOLIZ  12/21/2019  . LEFT HEART CATH AND CORONARY ANGIOGRAPHY N/A 04/24/2019   Procedure: LEFT HEART CATH AND CORONARY ANGIOGRAPHY;  Surgeon: Belva Crome, MD;  Location: [place] [facility];  Service: Cardiovascular;  Laterality: N/A;  . LEFT HEART CATHETERIZATION WITH CORONARY ANGIOGRAM N/A 11/19/2014   Procedure: LEFT HEART CATHETERIZATION WITH CORONARY ANGIOGRAM;  Surgeon: [name], MD;  Location: [facility] C[facility] LAB;  Service: Cardiovascular;  Laterality: N/A;  . RADIOLOGY WITH ANESTHESIA N/A 11/23/2019   Procedure: Drucilla Schmidt;  Surgeon: Luanne Bras, MD;  Location: [place];  Service: Radiology;  Laterality: N/A;  . RADIOLOGY WITH ANESTHESIA N/A 12/21/2019   Procedure: EMBOLIZATION;  Surgeon: Luanne Bras, MD;  Location: [place];  Service: Radiology;  Laterality: N/A;  . RIGHT/LEFT HEART CATH AND CORONARY ANGIOGRAPHY N/A 06/06/2018   Procedure: RIGHT/LEFT HEART CATH AND CORONARY ANGIOGRAPHY;  Surgeon: [name], [name], MD;  Location: [place] [facility];  Service: Cardiovascular;  Laterality: N/A;    Family History: Family History  Problem Relation Age of Onset  . Dementia Mother   . Heart disease Mother        heart attack   . Hypertension Mother   . Heart attack Father   . Heart disease Father        heart attack   . Hypertension Father     Social History: Social History   Tobacco Use  Smoking Status Never Smoker  Smokeless Tobacco Never Used   Social History   Substance and Sexual Activity  Alcohol Use No  . Alcohol/week: 0.0 standard drinks   Social History   Substance and Sexual Activity  Drug Use No    Allergies: No Known Allergies  Medications: Current Outpatient Medications  Medication Sig Dispense Refill  . acetaminophen (TYLENOL) 500 MG tablet Take 1,000 mg by mouth every 6 (six) hours as needed for moderate pain.    Marland Kitchen amLODipine (NORVASC) 10 MG tablet Take 1 tablet (10 mg  total) by mouth daily. 30 tablet 2  . aspirin EC 81 MG tablet Take 81 mg by mouth daily. Swallow whole.    . cloNIDine (CATAPRES) 0.3 MG tablet Take 1 tablet (0.3 mg total) by mouth at bedtime. 90 tablet 3  . clopidogrel (PLAVIX) 75 MG tablet Take 1 tablet (75 mg total) by mouth daily. 90 tablet 3  . escitalopram (LEXAPRO) 10 MG tablet escitalopram 10 mg tablet  TAKE 1 TABLET BY MOUTH ONCE DAILY    . metoprolol succinate (TOPROL-XL) 25 MG 24 hr tablet Take 0.5 tablets (12.5 mg total) by mouth daily. 45 tablet 3  . nitroGLYCERIN (NITROSTAT) 0.4 MG SL tablet Place 1 tablet (0.4 mg total) under the tongue every 5 (five) minutes as needed. 25 tablet 2  . pantoprazole (PROTONIX) 40 MG tablet Take 1 tablet (40 mg total) by mouth daily. 15 tablet 0  . PARoxetine (PAXIL) 10 MG tablet Take 1 tablet (10 mg total) by mouth daily. 30 tablet 5  . potassium  chloride SA (KLOR-CON) 20 MEQ tablet Take 1 tablet (20 mEq total) by mouth daily. 30 tablet 3  . rosuvastatin (CRESTOR) 40 MG tablet TAKE 1 TABLET BY MOUTH  DAILY 90 tablet 3  . temazepam (RESTORIL) 15 MG capsule TAKE 1 CAPSULE BY MOUTH AT BEDTIME AS NEEDED FOR SLEEP 30 capsule 5  . topiramate (TOPAMAX) 25 MG tablet Take 50 mg by mouth daily.    . busPIRone (BUSPAR) 5 MG tablet buspirone 5 mg tablet (Patient not taking: Reported on 01/12/2021)    . prednisoLONE acetate (PRED FORTE) 1 % ophthalmic suspension prednisolone acetate 1 % eye drops,suspension  INSTILL 1 DROP INTO RIGHT EYE EVERY 8 HOURS SHAKE WELL    . Simethicone 80 MG TABS Take 1 tablet (80 mg total) by mouth 2 (two) times daily as needed. (Patient not taking: Reported on 01/12/2021) 60 tablet 1   No current facility-administered medications for this visit.    Review of Systems: GENERAL: negative for malaise, night sweats HEENT: No changes in hearing or vision, no nose bleeds or other nasal problems. NECK: Negative for lumps, goiter, pain and significant neck swelling RESPIRATORY: Negative  for cough, wheezing CARDIOVASCULAR: Negative for chest pain, leg swelling, palpitations, orthopnea GI: SEE HPI MUSCULOSKELETAL: Negative for joint pain or swelling, back pain, and muscle pain. SKIN: Negative for lesions, rash PSYCH: Negative for sleep disturbance, mood disorder and recent psychosocial stressors. HEMATOLOGY Negative for prolonged bleeding, bruising easily, and swollen nodes. ENDOCRINE: Negative for cold or heat intolerance, polyuria, polydipsia and goiter. NEURO: negative for tremor, gait imbalance, syncope and seizures. The remainder of the review of systems is noncontributory.   Physical Exam: BP (!) 145/79 (BP Location: Left Arm, Patient Position: Sitting, Cuff Size: Large)   Pulse 68   Temp (!) 97.5 F (36.4 C) (Oral)   Ht 5\' 4"  (1.626 m)   Wt 137 lb (62.1 kg)   BMI 23.52 kg/m  GENERAL: The patient is AO x3, in no acute distress. HEENT: Head is normocephalic and atraumatic. EOMI are intact. Mouth is well hydrated and without lesions. NECK: Supple. No masses LUNGS: Clear to auscultation. No presence of rhonchi/wheezing/rales. Adequate chest expansion HEART: RRR, normal s1 and s2. ABDOMEN: mildly tender in epigastric area, no guarding, no peritoneal signs, and nondistended. BS +. No masses. EXTREMITIES: Without any cyanosis, clubbing, rash, lesions or edema. NEUROLOGIC: AOx3, no focal motor deficit. SKIN: no jaundice, no rashes   Imaging/Labs: as above  I personally reviewed and interpreted the available labs, imaging and endoscopic files.  Impression and Plan: Tanya [name] is a 70 y.o. female from [place] with [place] aneurysm surgery in 2020, CAD s/p stent placement 2 years ago,  who presents for evaluation of abdominal pain and nausea.  Patient has been taking onset of symptoms of abdominal pain along with bloating and constipation.  Patient has not presented any red flag signs.  Given her age, it would be important to evaluate this further with an EGD  with small bowel biopsies.  Patient may benefit from starting MiraLAX daily as well as improvement in the ED but endoscopic ultrasound for her symptoms may be related to IBS-C.  She will also implementing a low FODMAP diet.  As she is presenting recurrent heartburn despite taking pantoprazole, will attempt to treat with a PPI with higher potency such as omeprazole daily.  Patient understood and agreed.  -Schedule EGD with SB biopsy off Plavix, will obtain clearance - Start taking Miralax 1 cap every day for one week. If  bowel movements do not improve, increase to 1 cup every 12 hours. If after two weeks there is no improvement, increase to 1 cup every 8 hours - Eat prune and/or kiwi daily - Stop pantoprazole, start omeprazole 40 mg qday - Explained presumed etiology of IBS symptoms. Patient was counseled about the benefit of implementing a low FODMAP to improve symptoms and recurrent episodes. A dietary list was provided to the patient. Also, the patient was counseled about the benefit of avoiding stressing situations and potential environmental triggers leading to symptomatology. - Explained presumed etiology of reflux symptoms. Instruction provided in the use of antireflux medication - patient should take medication in the morning 30-45 minutes before eating breakfast. Discussed avoidance of eating within 2 hours of lying down to sleep and benefit of blocks to elevate head of bed.  All questions were answered.      [name], MD Gastroenterology and Hepatology [facility]

## 2021-01-12 NOTE — Patient Instructions (Addendum)
Schedule EGD with SB biopsy off Plavix, will obtain clearance Start taking Miralax 1 cap every day for one week. If bowel movements do not improve, increase to 1 cup every 12 hours. If after two weeks there is no improvement, increase to 1 cup every 8 hours Eat prune and/or kiwi daily Stop pantoprazole, start omeprazole 40 mg qday Explained presumed etiology of IBS symptoms. Patient was counseled about the benefit of implementing a low FODMAP to improve symptoms and recurrent episodes. A dietary list was provided to the patient. Also, the patient was counseled about the benefit of avoiding stressing situations and potential environmental triggers leading to symptomatology. Explained presumed etiology of reflux symptoms. Instruction provided in the use of antireflux medication - patient should take medication in the morning 30-45 minutes before eating breakfast. Discussed avoidance of eating within 2 hours of lying down to sleep and benefit of blocks to elevate head of bed.

## 2021-01-15 ENCOUNTER — Encounter: Payer: Self-pay | Admitting: Family Medicine

## 2021-01-15 DIAGNOSIS — R42 Dizziness and giddiness: Secondary | ICD-10-CM | POA: Insufficient documentation

## 2021-01-15 NOTE — Assessment & Plan Note (Signed)
Reports recurrent lightheadedness carotid US

## 2021-01-15 NOTE — Assessment & Plan Note (Signed)
Hyperlipidemia:Low fat diet discussed and encouraged.   Lipid Panel  Lab Results  Component Value Date   CHOL 130 01/04/2021   HDL 67 01/04/2021   LDLCALC 50 01/04/2021   TRIG 63 01/04/2021   CHOLHDL 1.9 01/04/2021   Controlled, no change in medication

## 2021-01-15 NOTE — Assessment & Plan Note (Signed)
Controlled, no change in medication  

## 2021-01-15 NOTE — Assessment & Plan Note (Signed)
Uncontrolled, increase amlodipine to 10 mg DASH diet and commitment to daily physical activity for a minimum of 30 minutes discussed and encouraged, as a part of hypertension management. The importance of attaining a healthy weight is also discussed.  BP/Weight 01/12/2021 01/02/2021 11/16/2020 10/14/2020 10/03/2020 09/28/2020 68/34/1962  Systolic BP 229 798 921 194 174 081 448  Diastolic BP 79 67 80 76 78 80 75  Wt. (Lbs) 137 135.12 138.4 133.8 137.12 138 136.8  BMI 23.52 23.19 23.76 22.97 23.54 23.69 23.48

## 2021-01-17 ENCOUNTER — Other Ambulatory Visit: Payer: Self-pay

## 2021-01-17 ENCOUNTER — Other Ambulatory Visit (HOSPITAL_COMMUNITY)
Admission: RE | Admit: 2021-01-17 | Discharge: 2021-01-17 | Disposition: A | Discharge status: Home / Self Care | Payer: Medicare Other | Source: Ambulatory Visit | Attending: Gastroenterology | Admitting: Gastroenterology

## 2021-01-17 DIAGNOSIS — Z01812 Encounter for preprocedural laboratory examination: Secondary | ICD-10-CM | POA: Insufficient documentation

## 2021-01-17 DIAGNOSIS — Z20822 Contact with and (suspected) exposure to covid-19: Secondary | ICD-10-CM | POA: Diagnosis not present

## 2021-01-17 LAB — SARS CORONAVIRUS 2 (TAT 6-24 HRS): SARS Coronavirus 2: NEGATIVE

## 2021-01-18 ENCOUNTER — Encounter (INDEPENDENT_AMBULATORY_CARE_PROVIDER_SITE_OTHER): Payer: Self-pay | Admitting: Gastroenterology

## 2021-01-18 ENCOUNTER — Other Ambulatory Visit (INDEPENDENT_AMBULATORY_CARE_PROVIDER_SITE_OTHER): Payer: Self-pay | Admitting: Gastroenterology

## 2021-01-18 ENCOUNTER — Encounter (HOSPITAL_COMMUNITY)
Admission: RE | Disposition: A | Discharge status: Home / Self Care | Payer: Self-pay | Source: Home / Self Care | Attending: Gastroenterology

## 2021-01-18 ENCOUNTER — Other Ambulatory Visit: Payer: Self-pay

## 2021-01-18 ENCOUNTER — Telehealth: Payer: Self-pay

## 2021-01-18 ENCOUNTER — Ambulatory Visit (HOSPITAL_COMMUNITY)
Admission: RE | Admit: 2021-01-18 | Discharge: 2021-01-18 | Disposition: A | Discharge status: Home / Self Care | Payer: Medicare Other | Attending: Gastroenterology | Admitting: Gastroenterology

## 2021-01-18 ENCOUNTER — Encounter (HOSPITAL_COMMUNITY): Payer: Self-pay | Admitting: Gastroenterology

## 2021-01-18 ENCOUNTER — Telehealth (INDEPENDENT_AMBULATORY_CARE_PROVIDER_SITE_OTHER): Payer: Self-pay | Admitting: *Deleted

## 2021-01-18 ENCOUNTER — Ambulatory Visit (HOSPITAL_COMMUNITY): Payer: Medicare Other | Admitting: Certified Registered"

## 2021-01-18 DIAGNOSIS — B9681 Helicobacter pylori [H. pylori] as the cause of diseases classified elsewhere: Secondary | ICD-10-CM | POA: Insufficient documentation

## 2021-01-18 DIAGNOSIS — K31A19 Gastric intestinal metaplasia without dysplasia, unspecified site: Secondary | ICD-10-CM | POA: Insufficient documentation

## 2021-01-18 DIAGNOSIS — Z820 Family history of epilepsy and other diseases of the nervous system: Secondary | ICD-10-CM | POA: Diagnosis not present

## 2021-01-18 DIAGNOSIS — K644 Residual hemorrhoidal skin tags: Secondary | ICD-10-CM | POA: Diagnosis not present

## 2021-01-18 DIAGNOSIS — Z79899 Other long term (current) drug therapy: Secondary | ICD-10-CM | POA: Diagnosis not present

## 2021-01-18 DIAGNOSIS — I2510 Atherosclerotic heart disease of native coronary artery without angina pectoris: Secondary | ICD-10-CM | POA: Insufficient documentation

## 2021-01-18 DIAGNOSIS — R1013 Epigastric pain: Secondary | ICD-10-CM | POA: Diagnosis not present

## 2021-01-18 DIAGNOSIS — Z955 Presence of coronary angioplasty implant and graft: Secondary | ICD-10-CM | POA: Insufficient documentation

## 2021-01-18 DIAGNOSIS — Z7982 Long term (current) use of aspirin: Secondary | ICD-10-CM | POA: Diagnosis not present

## 2021-01-18 DIAGNOSIS — R109 Unspecified abdominal pain: Secondary | ICD-10-CM

## 2021-01-18 DIAGNOSIS — R140 Abdominal distension (gaseous): Secondary | ICD-10-CM | POA: Diagnosis not present

## 2021-01-18 DIAGNOSIS — K219 Gastro-esophageal reflux disease without esophagitis: Secondary | ICD-10-CM | POA: Insufficient documentation

## 2021-01-18 DIAGNOSIS — K2950 Unspecified chronic gastritis without bleeding: Secondary | ICD-10-CM | POA: Diagnosis not present

## 2021-01-18 DIAGNOSIS — K3189 Other diseases of stomach and duodenum: Secondary | ICD-10-CM

## 2021-01-18 DIAGNOSIS — K449 Diaphragmatic hernia without obstruction or gangrene: Secondary | ICD-10-CM | POA: Diagnosis not present

## 2021-01-18 DIAGNOSIS — Z7902 Long term (current) use of antithrombotics/antiplatelets: Secondary | ICD-10-CM | POA: Insufficient documentation

## 2021-01-18 DIAGNOSIS — Z8249 Family history of ischemic heart disease and other diseases of the circulatory system: Secondary | ICD-10-CM | POA: Diagnosis not present

## 2021-01-18 DIAGNOSIS — I10 Essential (primary) hypertension: Secondary | ICD-10-CM | POA: Diagnosis not present

## 2021-01-18 HISTORY — PX: BIOPSY: SHX5522

## 2021-01-18 HISTORY — PX: ESOPHAGOGASTRODUODENOSCOPY (EGD) WITH PROPOFOL: SHX5813

## 2021-01-18 SURGERY — ESOPHAGOGASTRODUODENOSCOPY (EGD) WITH PROPOFOL
Anesthesia: General

## 2021-01-18 MED ORDER — PHENYLEPHRINE 40 MCG/ML (10ML) SYRINGE FOR IV PUSH (FOR BLOOD PRESSURE SUPPORT)
PREFILLED_SYRINGE | INTRAVENOUS | Status: DC | PRN
  Administered 2021-01-18: 80 ug via INTRAVENOUS

## 2021-01-18 MED ORDER — PROPOFOL 10 MG/ML IV BOLUS
INTRAVENOUS | Status: DC | PRN
  Administered 2021-01-18: 100 mg via INTRAVENOUS

## 2021-01-18 MED ORDER — PROPOFOL 500 MG/50ML IV EMUL
INTRAVENOUS | Status: DC | PRN
  Administered 2021-01-18: 150 ug/kg/min via INTRAVENOUS

## 2021-01-18 MED ORDER — LACTATED RINGERS IV SOLN: INTRAVENOUS | Status: DC

## 2021-01-18 MED ORDER — LIDOCAINE HCL (CARDIAC) PF 100 MG/5ML IV SOSY
PREFILLED_SYRINGE | INTRAVENOUS | Status: DC | PRN
  Administered 2021-01-18: 50 mg via INTRAVENOUS

## 2021-01-18 NOTE — Telephone Encounter (Signed)
FMLA   Noted Copied  Sleeved  

## 2021-01-18 NOTE — Discharge Instructions (Signed)
You are being discharged to home.  Resume your previous diet.  We are waiting for your pathology results.  Schedule CT abdomen and pelvis with IV contrast. The office will call you to schedule this test.  Continue omeprazole and Miralax.   Upper Endoscopy, Adult, Care After This sheet gives you information about how to care for yourself after your procedure. Your health care provider may also give you more specific instructions. If you have problems or questions, contact your health care provider. What can I expect after the procedure? After the procedure, it is common to have:  A sore throat.  Mild stomach pain or discomfort.  Bloating.  Nausea. Follow these instructions at home:  Follow instructions from your health care provider about what to eat or drink after your procedure.  Return to your normal activities as told by your health care provider. Ask your health care provider what activities are safe for you.  Take over-the-counter and prescription medicines only as told by your health care provider.  If you were given a sedative during the procedure, it can affect you for several hours. Do not drive or operate machinery until your health care provider says that it is safe.  Keep all follow-up visits as told by your health care provider. This is important.   Contact a health care provider if you have:  A sore throat that lasts longer than one day.  Trouble swallowing. Get help right away if:  You vomit blood or your vomit looks like coffee grounds.  You have: ? A fever. ? Bloody, black, or tarry stools. ? A severe sore throat or you cannot swallow. ? Difficulty breathing. ? Severe pain in your chest or abdomen. Summary  After the procedure, it is common to have a sore throat, mild stomach discomfort, bloating, and nausea.  If you were given a sedative during the procedure, it can affect you for several hours. Do not drive or operate machinery until your health  care provider says that it is safe.  Follow instructions from your health care provider about what to eat or drink after your procedure.  Return to your normal activities as told by your health care provider. This information is not intended to replace advice given to you by your health care provider. Make sure you discuss any questions you have with your health care provider. Document Revised: 09/15/2019 Document Reviewed: 02/17/2018 Elsevier Patient Education  2021 [place], Adult     A hernia happens when tissue inside your body pushes out through a weak spot in your belly muscles (abdominal wall). This makes a round lump (bulge). The lump may be:  In a scar from surgery that was done in your belly (incisional hernia).  Near your belly button (umbilical hernia).  In your groin (inguinal hernia). Your groin is the area where your leg meets your lower belly (abdomen). This kind of hernia could also be: ? In your scrotum, if you are female. ? In folds of skin around your vagina, if you are female.  In your upper thigh (femoral hernia).  Inside your belly (hiatal hernia). This happens when your stomach slides above the muscle between your belly and your chest (diaphragm). If your hernia is small and it does not cause pain, you may not need treatment. If your hernia is large or it causes pain, you may need surgery. Follow these instructions at home: Activity  Avoid stretching or overusing (straining) the muscles near your hernia. Straining can happen  when you: ? Lift something heavy. ? Poop (have a bowel movement).  Do not lift anything that is heavier than 10 lb (4.5 kg), or the limit that you are told, until your doctor says that it is safe.  Use the strength of your legs when you lift something heavy. Do not use only your back muscles to lift. General instructions  Do these things if told by your doctor so you do not have trouble pooping (constipation): ? Drink  enough fluid to keep your pee (urine) pale yellow. ? Eat foods that are high in fiber. These include fresh fruits and vegetables, whole grains, and beans. ? Limit foods that are high in fat and processed sugars. These include foods that are fried or sweet. ? Take medicine for trouble pooping.  When you cough, try to cough gently.  You may try to push your hernia in by very gently pressing on it when you are lying down. Do not try to force the bulge back in if it will not push in easily.  If you are overweight, work with your doctor to lose weight safely.  Do not use any products that have nicotine or tobacco in them. These include cigarettes and e-cigarettes. If you need help quitting, ask your doctor.  If you will be having surgery (hernia repair), watch your hernia for changes in shape, size, or color. Tell your doctor if you see any changes.  Take over-the-counter and prescription medicines only as told by your doctor.  Keep all follow-up visits as told by your doctor. Contact a doctor if:  You get new pain, swelling, or redness near your hernia.  You poop fewer times in a week than normal.  You have trouble pooping.  You have poop (stool) that is more dry than normal.  You have poop that is harder or larger than normal. Get help right away if:  You have a fever.  You have belly pain that gets worse.  You feel sick to your stomach (nauseous).  You throw up (vomit).  Your hernia cannot be pushed in by very gently pressing on it when you are lying down. Do not try to force the bulge back in if it will not push in easily.  Your hernia: ? Changes in shape or size. ? Changes color. ? Feels hard or it hurts when you touch it. These symptoms may represent a serious problem that is an emergency. Do not wait to see if the symptoms will go away. Get medical help right away. Call your local emergency services (911 in the [place] Summary  A hernia happens when tissue inside your  body pushes out through a weak spot in the belly muscles. This creates a bulge.  If your hernia is small and it does not hurt, you may not need treatment. If your hernia is large or it hurts, you may need surgery.  If you will be having surgery, watch your hernia for changes in shape, size, or color. Tell your doctor about any changes. This information is not intended to replace advice given to you by your health care provider. Make sure you discuss any questions you have with your health care provider. Document Revised: 01/08/2019 Document Reviewed: 06/19/2017 Elsevier Patient Education  [place].

## 2021-01-18 NOTE — Anesthesia Preprocedure Evaluation (Signed)
Anesthesia Evaluation  Patient identified by MRN, date of birth, ID band Patient awake    Reviewed: Allergy & Precautions, H&P , NPO status , Patient's Chart, lab work & pertinent test results, reviewed documented beta blocker date and time   Airway Mallampati: II  TM Distance: >3 FB Neck ROM: full    Dental no notable dental hx.    Pulmonary neg pulmonary ROS,    Pulmonary exam normal breath sounds clear to auscultation       Cardiovascular Exercise Tolerance: Good hypertension, + CAD and + Cardiac Stents   Rhythm:regular Rate:Normal     Neuro/Psych  Headaches, PSYCHIATRIC DISORDERS Anxiety    GI/Hepatic Neg liver ROS, GERD  Medicated,  Endo/Other  Hypothyroidism   Renal/GU negative Renal ROS  negative genitourinary   Musculoskeletal   Abdominal   Peds  Hematology negative hematology ROS (+)   Anesthesia Other Findings Carotid aneurysm s/p repair  Reproductive/Obstetrics negative OB ROS                             Anesthesia Physical Anesthesia Plan  ASA: II  Anesthesia Plan: General   Post-op Pain Management:    Induction:   PONV Risk Score and Plan: Propofol infusion  Airway Management Planned:   Additional Equipment:   Intra-op Plan:   Post-operative Plan:   Informed Consent: I have reviewed the patients History and Physical, chart, labs and discussed the procedure including the risks, benefits and alternatives for the proposed anesthesia with the patient or authorized representative who has indicated his/her understanding and acceptance.     Dental Advisory Given  Plan Discussed with: CRNA  Anesthesia Plan Comments:         Anesthesia Quick Evaluation

## 2021-01-18 NOTE — Progress Notes (Signed)
Patient is scheduled on Wednesday 02/15/21 at 9:00 AM

## 2021-01-18 NOTE — Telephone Encounter (Signed)
Per EGD op note, patient needs CT A/P w/ contrast

## 2021-01-18 NOTE — Progress Notes (Signed)
Thanks

## 2021-01-18 NOTE — OR Nursing (Signed)
Tanya [name] had a procedure performed at [facility] on 01/18/2021. She can return to work tomorrow on 01/19/2021.

## 2021-01-18 NOTE — Op Note (Signed)
[facility] Patient Name: Tanya [name] Procedure Date: 01/18/2021 8:43 AM MRN: [id number] Date of Birth: 06/07/1951 Attending MD: [name] ,  CSN: [id number] Age: 70 Admit Type: Outpatient Procedure:                Upper GI endoscopy Indications:              Epigastric abdominal pain, Abdominal bloating Providers:                [name], Gwenlyn Fudge, RN, Steele                            Risa Grill, Technician Referring MD:              Medicines:                Monitored Anesthesia Care Complications:            No immediate complications. Estimated Blood Loss:     Estimated blood loss: none. Procedure:                Pre-Anesthesia Assessment:                           - Prior to the procedure, a History and Physical                            was performed, and patient medications, allergies                            and sensitivities were reviewed. The patient's                            tolerance of previous anesthesia was reviewed.                           - The risks and benefits of the procedure and the                            sedation options and risks were discussed with the                            patient. All questions were answered and informed                            consent was obtained.                           - ASA Grade Assessment: II - A patient with mild                            systemic disease.                           After obtaining informed consent, the endoscope was                            passed under direct vision. Throughout the  procedure, the patient's blood pressure, pulse, and                            oxygen saturations were monitored continuously. The                            [id number]-H190 ([id number]) scope was introduced through the                            mouth, and advanced to the second part of duodenum.                            The upper GI endoscopy was accomplished  without                            difficulty. The patient tolerated the procedure                            well. Scope In: 9:01:10 AM Scope Out: 9:07:50 AM Total Procedure Duration: 0 hours 6 minutes 40 seconds  Findings:      A small hiatal hernia was present.      Pinpoint erythematous mucosa without bleeding was found in the gastric       fundus. Biopsies from the body and antrum were taken with a cold forceps       for Helicobacter pylori testing.      The examined duodenum was normal. Impression:               - Small hiatal hernia.                           - Erythematous mucosa in the gastric fundus.                            Biopsied.                           - Normal examined duodenum. Moderate Sedation:      Per Anesthesia Care Recommendation:           - Discharge patient to home (ambulatory).                           - Resume previous diet.                           - Await pathology results.                           - Schedule CT abdomen and pelvis with IV contrast.                           - Continue omeprazole and Miralax. Procedure Code(s):        --- Professional ---                           [phone], Esophagogastroduodenoscopy, flexible,  transoral; with biopsy, single or multiple Diagnosis Code(s):        --- Professional ---                           K44.9, Diaphragmatic hernia without obstruction or                            gangrene                           K31.89, Other diseases of stomach and duodenum                           R10.13, Epigastric pain                           R14.0, Abdominal distension (gaseous) CPT copyright 2019 American Medical Association. All rights reserved. The codes documented in this report are preliminary and upon coder review may  be revised to meet current compliance requirements. [name], MD [name],  01/18/2021 9:15:36 AM This report has been signed electronically. Number  of Addenda: 0

## 2021-01-18 NOTE — Interval H&P Note (Signed)
History and Physical Interval Note:  01/18/2021 8:51 AM Tanya [name] is a 70 y.o. female from [place] with [place] aneurysm surgery in 2020, CAD s/p stent placement 2 years ago,  who presents for evaluation of abdominal pain and throat discomfort.  Patient reports that after she started taking MiraLAX her bowel movements have improved in amount she is having 1-2 bowel movements every day.  Denies any nausea, vomiting, fever or chills but reports that still has occasional discomfort in the epigastric area on the right side of her abdomen.  Also endorses having some occasional discomfort in her throat.  BP 139/65   Pulse 62   Temp 97.7 F (36.5 C) (Oral)   Resp 16   Ht 5\' 4"  (1.626 m)   Wt 62.1 kg   SpO2 100%   BMI 23.50 kg/m  GENERAL: The patient is AO x3, in no acute distress. HEENT: Head is normocephalic and atraumatic. EOMI are intact. Mouth is well hydrated and without lesions. NECK: Supple. No masses LUNGS: Clear to auscultation. No presence of rhonchi/wheezing/rales. Adequate chest expansion HEART: RRR, normal s1 and s2. ABDOMEN: Soft, nontender, no guarding, no peritoneal signs, and nondistended. BS +. No masses. EXTREMITIES: Without any cyanosis, clubbing, rash, lesions or edema. NEUROLOGIC: AOx3, no focal motor deficit. SKIN: no jaundice, no rashes   Tanya [name]  has presented today for surgery, with the diagnosis of Epigastric Pain.  The various methods of treatment have been discussed with the patient and family. After consideration of risks, benefits and other options for treatment, the patient has consented to  Procedure(s) with comments: ESOPHAGOGASTRODUODENOSCOPY (EGD) WITH PROPOFOL (N/A) - 10:30AM BIOPSY (N/A) as a surgical intervention.  The patient's history has been reviewed, patient examined, no change in status, stable for surgery.  I have reviewed the patient's chart and labs.  Questions were answered to the patient's satisfaction.     [name]  [place]

## 2021-01-18 NOTE — Telephone Encounter (Signed)
Done, thanks

## 2021-01-18 NOTE — Anesthesia Procedure Notes (Signed)
Date/Time: 01/18/2021 9:03 AM Performed by: Orlie Dakin, CRNA Pre-anesthesia Checklist: Patient identified, Emergency Drugs available, Suction available and Patient being monitored Patient Re-evaluated:Patient Re-evaluated prior to induction Oxygen Delivery Method: Nasal cannula Induction Type: IV induction Placement Confirmation: positive ETCO2

## 2021-01-18 NOTE — Progress Notes (Signed)
Hi Darius Bump,   Can you please schedule a CT abdomen/pelvis with IV contrast? Dx: abdominal pain.  Thanks,  [name], MD Gastroenterology and Hepatology [facility]

## 2021-01-18 NOTE — Transfer of Care (Signed)
Immediate Anesthesia Transfer of Care Note  Patient: Tanya [name]  Procedure(s) Performed: ESOPHAGOGASTRODUODENOSCOPY (EGD) WITH PROPOFOL (N/A ) BIOPSY (N/A )  Patient Location: Endoscopy Unit  Anesthesia Type:General  Level of Consciousness: drowsy  Airway & Oxygen Therapy: Patient Spontanous Breathing  Post-op Assessment: Report given to RN and Post -op Vital signs reviewed and stable  Post vital signs: Reviewed and stable  Last Vitals:  Vitals Value Taken Time  BP 78/36 01/18/21 0913  Temp    Pulse 61 01/18/21 0913  Resp 14 01/18/21 0913  SpO2 98 % 01/18/21 0913    Last Pain:  Vitals:   01/18/21 0859  TempSrc:   PainSc: 0-No pain      Patients Stated Pain Goal: 8 (67/73/73 [id number])  Complications: No complications documented.

## 2021-01-18 NOTE — Anesthesia Postprocedure Evaluation (Signed)
Anesthesia Post Note  Patient: Tanya [name]  Procedure(s) Performed: ESOPHAGOGASTRODUODENOSCOPY (EGD) WITH PROPOFOL (N/A ) BIOPSY (N/A )  Patient location during evaluation: Phase II Anesthesia Type: General Level of consciousness: awake Pain management: pain level controlled Vital Signs Assessment: post-procedure vital signs reviewed and stable Respiratory status: spontaneous breathing and respiratory function stable Cardiovascular status: blood pressure returned to baseline and stable Postop Assessment: no headache and no apparent nausea or vomiting Anesthetic complications: no Comments: Late entry   No complications documented.   Last Vitals:  Vitals:   01/18/21 0918 01/18/21 0923  BP: 95/68 (!) 103/57  Pulse: 64 (!) 58  Resp: 14 15  Temp:    SpO2: 98% 97%    Last Pain:  Vitals:   01/18/21 0913  TempSrc: Axillary  PainSc: 0-No pain                 Louann Sjogren

## 2021-01-19 ENCOUNTER — Ambulatory Visit: Payer: PRIVATE HEALTH INSURANCE | Admitting: Cardiology

## 2021-01-19 LAB — SURGICAL PATHOLOGY

## 2021-01-20 ENCOUNTER — Other Ambulatory Visit (INDEPENDENT_AMBULATORY_CARE_PROVIDER_SITE_OTHER): Payer: Self-pay | Admitting: Gastroenterology

## 2021-01-20 ENCOUNTER — Encounter (HOSPITAL_COMMUNITY): Payer: Self-pay | Admitting: Gastroenterology

## 2021-01-20 DIAGNOSIS — K2970 Gastritis, unspecified, without bleeding: Secondary | ICD-10-CM

## 2021-01-20 DIAGNOSIS — B9681 Helicobacter pylori [H. pylori] as the cause of diseases classified elsewhere: Secondary | ICD-10-CM

## 2021-01-20 MED ORDER — BISMUTH 262 MG PO CHEW: 2.0000 | CHEWABLE_TABLET | Freq: Four times a day (QID) | ORAL | 0 refills | Status: AC

## 2021-01-20 MED ORDER — OMEPRAZOLE 40 MG PO CPDR
40.0000 mg | DELAYED_RELEASE_CAPSULE | Freq: Two times a day (BID) | ORAL | 0 refills | Status: DC
Start: 2021-01-20 — End: 2021-02-14

## 2021-01-20 MED ORDER — TETRACYCLINE HCL 500 MG PO CAPS: 500.0000 mg | ORAL_CAPSULE | Freq: Four times a day (QID) | ORAL | 0 refills | Status: AC

## 2021-01-20 MED ORDER — METRONIDAZOLE 500 MG PO TABS: 500.0000 mg | ORAL_TABLET | Freq: Three times a day (TID) | ORAL | 0 refills | Status: AC

## 2021-01-24 ENCOUNTER — Ambulatory Visit (INDEPENDENT_AMBULATORY_CARE_PROVIDER_SITE_OTHER): Payer: PRIVATE HEALTH INSURANCE | Admitting: Gastroenterology

## 2021-01-25 ENCOUNTER — Telehealth: Payer: Self-pay | Admitting: Cardiology

## 2021-01-25 NOTE — Telephone Encounter (Signed)
Phone call was actually to pt husband, who is also a pt regarding f/u appt with Dr. [name]

## 2021-01-25 NOTE — Telephone Encounter (Signed)
New Message    Patient husband returning a call did not know what it was regarding

## 2021-01-26 ENCOUNTER — Other Ambulatory Visit: Payer: Self-pay

## 2021-01-26 ENCOUNTER — Ambulatory Visit (INDEPENDENT_AMBULATORY_CARE_PROVIDER_SITE_OTHER): Payer: Medicare Other | Admitting: Internal Medicine

## 2021-01-26 ENCOUNTER — Telehealth: Payer: Self-pay | Admitting: *Deleted

## 2021-01-26 ENCOUNTER — Encounter: Payer: Self-pay | Admitting: Internal Medicine

## 2021-01-26 VITALS — BP 150/80 | HR 69 | Resp 18 | Ht 64.0 in | Wt 136.1 lb

## 2021-01-26 DIAGNOSIS — G43909 Migraine, unspecified, not intractable, without status migrainosus: Secondary | ICD-10-CM | POA: Insufficient documentation

## 2021-01-26 DIAGNOSIS — A048 Other specified bacterial intestinal infections: Secondary | ICD-10-CM

## 2021-01-26 DIAGNOSIS — I10 Essential (primary) hypertension: Secondary | ICD-10-CM

## 2021-01-26 DIAGNOSIS — G441 Vascular headache, not elsewhere classified: Secondary | ICD-10-CM | POA: Diagnosis not present

## 2021-01-26 DIAGNOSIS — Z8679 Personal history of other diseases of the circulatory system: Secondary | ICD-10-CM

## 2021-01-26 DIAGNOSIS — G43819 Other migraine, intractable, without status migrainosus: Secondary | ICD-10-CM

## 2021-01-26 DIAGNOSIS — E785 Hyperlipidemia, unspecified: Secondary | ICD-10-CM

## 2021-01-26 MED ORDER — NURTEC 75 MG PO TBDP
1.0000 | ORAL_TABLET | ORAL | 0 refills | Status: DC
Start: 2021-01-26 — End: 2021-01-26

## 2021-01-26 MED ORDER — NURTEC 75 MG PO TBDP: 1.0000 | ORAL_TABLET | Freq: Every day | ORAL | 0 refills | Status: DC | PRN

## 2021-01-26 NOTE — Assessment & Plan Note (Signed)
BP Readings from Last 1 Encounters:  01/26/21 (!) 150/80   uncontrolled with Amlodipine 10 mg QD, recent increase in dose Could be elevated due to persistent headache Although elevated BP might also be contributing to headache, pattern of headache more consistent with migraine Counseled for compliance with the medications Advised DASH diet and moderate exercise/walking, at least 150 mins/week

## 2021-01-26 NOTE — Progress Notes (Signed)
Acute Office Visit  Subjective:    Patient ID: Tanya [name], female    DOB: 05/14/1951, 70 y.o.   MRN: [id number]  Chief Complaint  Patient presents with  . Headache    Pt has been having bad headaches for about 4 days she was told she has more blockages she doesn't understand why she is getting bad headaches     HPI Patient is in today for evaluation of persistent headache for last 3-4 days, which is 05/10/09, in right occipital and temporal area. Pain has been more severe, sharp and nonradiating. She also reports nausea. She was prescribed Topiramate by Dr Merlene Laughter, but she has been concerned about her eyes as she came to know it can damage her eyes. Of note, she has h/o left ICA aneurysm repair. She recently had carotid artery [place], which showed worsening of bilateral atherosclerotic plaque, but no significant stenosis. She is already on plavix and statin. She denies any numbness, tingling or weakness of the extremities. She wants to see a different Neurologist for migraine.  She recently had EGD and was diagnosed with H Pylori. She is on quadruple therapy for it.  Past Medical History:  Diagnosis Date  . Aneurysm ([place])    left ICA 09/2019  . Anhedonia   . Anxiety   . CAD (coronary artery disease)    a. s/p DESx2 to proximal LAD in 06/2018 b. cath in 04/2019 showing 40% mid-LAD ISR with no significant stenosis along LM, LCx, or RCA  . Cystitis   . Elevated liver enzymes   . Fatty liver   . GERD (gastroesophageal reflux disease)   . Headache    migraine  . Hyperlipidemia   . Hypertension   . Insomnia   . Osteoarthritis   . Unspecified hypothyroidism   . Vaginitis   . Wears glasses   . Wears hearing aid    B/L    Past Surgical History:  Procedure Laterality Date  . BIOPSY N/A 01/18/2021   Procedure: BIOPSY;  Surgeon: Harvel Quale, MD;  [facility];  Service: Gastroenterology;  Laterality: N/A;  . CATARACT EXTRACTION W/PHACO Right 05/08/2019    Procedure: CATARACT EXTRACTION PHACO AND INTRAOCULAR LENS PLACEMENT (IOC);  Surgeon: Baruch Goldmann, MD;  Location: [facility];  Service: Ophthalmology;  Laterality: Right;  CDE: 10.02  . COLONOSCOPY N/A 02/08/2016   Procedure: COLONOSCOPY;  Surgeon: [name], MD;  [facility];  Service: Endoscopy;  Laterality: N/A;  1200 - moved to 11:45 - Ann to notify  . CORONARY STENT INTERVENTION N/A 06/06/2018   Procedure: CORONARY STENT INTERVENTION;  Surgeon: [name], [name], MD;  Location: [place] [facility];  Service: Cardiovascular;  Laterality: N/A;  . ESOPHAGOGASTRODUODENOSCOPY (EGD) WITH PROPOFOL N/A 01/18/2021   Procedure: ESOPHAGOGASTRODUODENOSCOPY (EGD) WITH PROPOFOL;  Surgeon: Harvel Quale, MD;  [facility];  Service: Gastroenterology;  Laterality: N/A;  10:30AM  . INTRAVASCULAR PRESSURE WIRE/FFR STUDY N/A 06/06/2018   Procedure: INTRAVASCULAR PRESSURE WIRE/FFR STUDY;  Surgeon: [name], [name], MD;  Location: [place] [facility];  Service: Cardiovascular;  Laterality: N/A;  . IR ANGIO INTRA EXTRACRAN SEL COM CAROTID INNOMINATE BILAT MOD SED  09/01/2019  . IR ANGIO INTRA EXTRACRAN SEL INTERNAL CAROTID UNI L MOD SED  12/21/2019  . IR ANGIO VERTEBRAL SEL VERTEBRAL BILAT MOD SED  09/01/2019  . IR ANGIOGRAM FOLLOW UP STUDY  12/21/2019  . IR TRANSCATH/EMBOLIZ  12/21/2019  . LEFT HEART CATH AND CORONARY ANGIOGRAPHY N/A 04/24/2019  Procedure: LEFT HEART CATH AND CORONARY ANGIOGRAPHY;  Surgeon: Belva Crome, MD;  Location: [place] [facility];  Service: Cardiovascular;  Laterality: N/A;  . LEFT HEART CATHETERIZATION WITH CORONARY ANGIOGRAM N/A 11/19/2014   Procedure: LEFT HEART CATHETERIZATION WITH CORONARY ANGIOGRAM;  Surgeon: [name], MD;  Location: [facility];  Service: Cardiovascular;  Laterality: N/A;  . RADIOLOGY WITH ANESTHESIA N/A 11/23/2019   Procedure: Drucilla Schmidt;  Surgeon: Luanne Bras, MD;  Location: [place];  Service: Radiology;  Laterality: N/A;  .  RADIOLOGY WITH ANESTHESIA N/A 12/21/2019   Procedure: EMBOLIZATION;  Surgeon: Luanne Bras, MD;  Location: [place];  Service: Radiology;  Laterality: N/A;  . RIGHT/LEFT HEART CATH AND CORONARY ANGIOGRAPHY N/A 06/06/2018   Procedure: RIGHT/LEFT HEART CATH AND CORONARY ANGIOGRAPHY;  Surgeon: [name], [name], MD;  Location: [place] [facility];  Service: Cardiovascular;  Laterality: N/A;    Family History  Problem Relation Age of Onset  . Dementia Mother   . Heart disease Mother        heart attack   . Hypertension Mother   . Heart attack Father   . Heart disease Father        heart attack   . Hypertension Father     Social History   Socioeconomic History  . Marital status: Married    Spouse name: Not on file  . Number of children: Not on file  . Years of education: Not on file  . Highest education level: Not on file  Occupational History  . Not on file  Tobacco Use  . Smoking status: Never Smoker  . Smokeless tobacco: Never Used  Vaping Use  . Vaping Use: Never used  Substance and Sexual Activity  . Alcohol use: No    Alcohol/week: 0.0 standard drinks  . Drug use: No  . Sexual activity: Not on file  Other Topics Concern  . Not on file  Social History Narrative   Pt originally from the [place] been in [place] for 20 years     Social Determinants of Radio broadcast assistant Strain: Not on file  Food Insecurity: Not on file  Transportation Needs: Not on file  Physical Activity: Not on file  Stress: Not on file  Social Connections: Not on file  Intimate Partner Violence: Not on file    Outpatient Medications Prior to Visit  Medication Sig Dispense Refill  . acetaminophen (TYLENOL) 500 MG tablet Take 1,000 mg by mouth every 6 (six) hours as needed for moderate pain.    Marland Kitchen amLODipine (NORVASC) 10 MG tablet Take 1 tablet (10 mg total) by mouth daily. 30 tablet 2  . aspirin EC 81 MG tablet Take 81 mg by mouth daily. Swallow whole.    . Bismuth 262 MG CHEW Chew 2 each  by mouth every 6 (six) hours for 14 days. 112 tablet 0  . busPIRone (BUSPAR) 5 MG tablet     . cloNIDine (CATAPRES) 0.3 MG tablet Take 1 tablet (0.3 mg total) by mouth at bedtime. 90 tablet 3  . clopidogrel (PLAVIX) 75 MG tablet Take 1 tablet (75 mg total) by mouth daily. 90 tablet 3  . escitalopram (LEXAPRO) 10 MG tablet escitalopram 10 mg tablet  TAKE 1 TABLET BY MOUTH ONCE DAILY    . metoprolol succinate (TOPROL-XL) 25 MG 24 hr tablet Take 0.5 tablets (12.5 mg total) by mouth daily. 45 tablet 3  . metroNIDAZOLE (FLAGYL) 500 MG tablet Take 1 tablet (500 mg total) by mouth 3 (  three) times daily for 14 days. 42 tablet 0  . nitroGLYCERIN (NITROSTAT) 0.4 MG SL tablet Place 1 tablet (0.4 mg total) under the tongue every 5 (five) minutes as needed. 25 tablet 2  . omeprazole (PRILOSEC) 40 MG capsule Take 1 capsule (40 mg total) by mouth daily. 90 capsule 3  . omeprazole (PRILOSEC) 40 MG capsule Take 1 capsule (40 mg total) by mouth 2 (two) times daily for 14 days. 28 capsule 0  . PARoxetine (PAXIL) 10 MG tablet Take 1 tablet (10 mg total) by mouth daily. 30 tablet 5  . potassium chloride SA (KLOR-CON) 20 MEQ tablet Take 1 tablet (20 mEq total) by mouth daily. 30 tablet 3  . prednisoLONE acetate (PRED FORTE) 1 % ophthalmic suspension prednisolone acetate 1 % eye drops,suspension  INSTILL 1 DROP INTO RIGHT EYE EVERY 8 HOURS SHAKE WELL    . rosuvastatin (CRESTOR) 40 MG tablet TAKE 1 TABLET BY MOUTH  DAILY 90 tablet 3  . Simethicone 80 MG TABS Take 1 tablet (80 mg total) by mouth 2 (two) times daily as needed. 60 tablet 1  . temazepam (RESTORIL) 15 MG capsule TAKE 1 CAPSULE BY MOUTH AT BEDTIME AS NEEDED FOR SLEEP 30 capsule 5  . tetracycline (SUMYCIN) 500 MG capsule Take 1 capsule (500 mg total) by mouth 4 (four) times daily for 14 days. 56 capsule 0  . topiramate (TOPAMAX) 25 MG tablet Take 50 mg by mouth daily.     No facility-administered medications prior to visit.    No Known Allergies  Review  of Systems  Constitutional: Negative for chills and fever.  HENT: Negative for congestion, sinus pressure, sinus pain and sore throat.   Eyes: Negative for pain and discharge.  Respiratory: Negative for cough and shortness of breath.   Cardiovascular: Negative for chest pain and palpitations.  Gastrointestinal: Positive for abdominal pain and nausea. Negative for constipation, diarrhea and vomiting.  Endocrine: Negative for polydipsia and polyuria.  Genitourinary: Negative for dysuria and hematuria.  Musculoskeletal: Negative for neck pain and neck stiffness.  Skin: Negative for rash.  Neurological: Positive for dizziness and headaches. Negative for weakness.  Psychiatric/Behavioral: Negative for agitation and behavioral problems.       Objective:    Physical Exam Vitals reviewed.  Constitutional:      General: She is not in acute distress.    Appearance: She is not diaphoretic.  HENT:     Head: Normocephalic and atraumatic.     Nose: Nose normal.     Mouth/Throat:     Mouth: Mucous membranes are moist.  Eyes:     General: No scleral icterus.    Extraocular Movements: Extraocular movements intact.     Pupils: Pupils are equal, round, and reactive to light.  Cardiovascular:     Rate and Rhythm: Normal rate and regular rhythm.     Pulses: Normal pulses.     Heart sounds: Normal heart sounds. No murmur heard.   Pulmonary:     Breath sounds: Normal breath sounds. No wheezing or rales.  Abdominal:     Palpations: Abdomen is soft.     Tenderness: There is no abdominal tenderness.  Musculoskeletal:     Cervical back: Neck supple. No tenderness.     Right lower leg: No edema.     Left lower leg: No edema.  Lymphadenopathy:     Cervical: No cervical adenopathy.  Skin:    General: Skin is warm.     Findings: No rash.  Neurological:  General: No focal deficit present.     Mental Status: She is alert and oriented to person, place, and time.     Cranial Nerves: No cranial  nerve deficit.     Sensory: No sensory deficit.     Motor: No weakness.  Psychiatric:        Mood and Affect: Mood normal.        Behavior: Behavior normal.     BP (!) 150/80 (BP Location: Left Arm, Cuff Size: Normal)   Pulse 69   Resp 18   Ht _0  (1.626 m)   Wt 136 lb 1.9 oz (61.7 kg)   SpO2 99%   BMI 23.36 kg/m  Wt Readings from Last 3 Encounters:  01/26/21 136 lb 1.9 oz (61.7 kg)  01/18/21 136 lb 14.5 oz (62.1 kg)  01/12/21 137 lb (62.1 kg)    There are no preventive care reminders to display for this patient.  There are no preventive care reminders to display for this patient.   Lab Results  Component Value Date   TSH 1.880 01/04/2021   Lab Results  Component Value Date   WBC 3.9 01/04/2021   HGB 12.6 01/04/2021   HCT 38.1 01/04/2021   MCV 95 01/04/2021   PLT 287 01/04/2021   Lab Results  Component Value Date   NA 143 01/04/2021   K 4.6 01/04/2021   CO2 20 01/04/2021   GLUCOSE 98 01/04/2021   BUN 14 01/04/2021   CREATININE 0.75 01/04/2021   BILITOT 0.4 01/04/2021   ALKPHOS 62 01/04/2021   AST 24 01/04/2021   ALT 14 01/04/2021   PROT 7.2 01/04/2021   ALBUMIN 4.7 01/04/2021   CALCIUM 9.8 01/04/2021   ANIONGAP 7 03/28/2020   EGFR 85 01/04/2021   Lab Results  Component Value Date   CHOL 130 01/04/2021   Lab Results  Component Value Date   HDL 67 01/04/2021   Lab Results  Component Value Date   LDLCALC 50 01/04/2021   Lab Results  Component Value Date   TRIG 63 01/04/2021   Lab Results  Component Value Date   CHOLHDL 1.9 01/04/2021   Lab Results  Component Value Date   HGBA1C 6.1 (H) 06/29/2019       Assessment & Plan:   Problem List Items Addressed This Visit      Cardiovascular and Mediastinum   Essential hypertension (Chronic)    BP Readings from Last 1 Encounters:  01/26/21 (!) 150/80   uncontrolled with Amlodipine 10 mg QD, recent increase in dose Could be elevated due to persistent headache Although elevated BP  might also be contributing to headache, pattern of headache more consistent with migraine Counseled for compliance with the medications Advised DASH diet and moderate exercise/walking, at least 150 mins/week       Migraine - Primary    Headache likely migraine On Topiramate Has h/o CVA and cerebral aneurysm, triptan contraindicated Prescribed Nurtec Referred to Neurology, patient wants to see a different Neurologist.      Relevant Medications   Rimegepant Sulfate (NURTEC) 75 MG TBDP   Other Relevant Orders   Ambulatory referral to Neurology     Other   Hyperlipemia (Chronic)    On statin      Headache   Relevant Medications   Rimegepant Sulfate (NURTEC) 75 MG TBDP   Other Relevant Orders   MR Brain Wo Contrast   History of cerebral aneurysm    S/p surgical repair Has headache on right side now  Change in quality of pain, will check MRI brain to r/o any new changes      Relevant Orders   Ambulatory referral to Neurology   H. pylori infection    Recently diagnosed from EGD On quadruple treatment, followed by GI          Meds ordered this encounter  Medications  . Rimegepant Sulfate (NURTEC) 75 MG TBDP    Sig: Take 1 tablet by mouth daily as needed (Migraine headache).    Dispense:  8 tablet    Refill:  0    Please cancel previous prescription. Change in sig and quantity.     Lindell Spar, MD

## 2021-01-26 NOTE — Assessment & Plan Note (Addendum)
Headache likely migraine On Topiramate Has h/o CVA and cerebral aneurysm, triptan contraindicated Prescribed Nurtec Referred to Neurology, patient wants to see a different Neurologist.

## 2021-01-26 NOTE — Assessment & Plan Note (Signed)
On statin.

## 2021-01-26 NOTE — Patient Instructions (Addendum)
Please continue taking Topiramate for migraine.  Please take Nurtec as needed for migraine, maximum 1 per day for now.  You are being scheduled to get MRI of the brain.  You are being referred to Neurology for evaluation of migraine.

## 2021-01-26 NOTE — Telephone Encounter (Signed)
Pt has been notified.

## 2021-01-26 NOTE — Telephone Encounter (Signed)
Attempted to leave a vm  letting pt know MRI had been set up for [facility] NA VM full

## 2021-01-26 NOTE — Assessment & Plan Note (Signed)
S/p surgical repair Has headache on right side now Change in quality of pain, will check MRI brain to r/o any new changes

## 2021-01-26 NOTE — Assessment & Plan Note (Signed)
Recently diagnosed from EGD On quadruple treatment, followed by GI

## 2021-01-27 ENCOUNTER — Other Ambulatory Visit: Payer: Self-pay

## 2021-01-27 DIAGNOSIS — G43819 Other migraine, intractable, without status migrainosus: Secondary | ICD-10-CM

## 2021-01-27 MED ORDER — NURTEC 75 MG PO TBDP: 1.0000 | ORAL_TABLET | Freq: Every day | ORAL | 0 refills | Status: DC | PRN

## 2021-02-07 NOTE — Progress Notes (Signed)
Cardiology Office Note    Date:  02/14/2021   ID:  Tanya [name], DOB 19-Oct-1950, MRN [id number]   PCP:  [name], MD   [place]  Cardiologist:  [name], MD  Advanced Practice Provider:  No care team member to display Electrophysiologist:  None   [phone]   Chief Complaint  Patient presents with  . Follow-up    History of Present Illness:  Tanya [name] is a 70 y.o. female with history of  CAD s/p DESx2 to proximal LAD in 06/2018 with cath in 04/2019 showing 40% mid-LAD ISR with no significant stenosis along LM, LCx, or RCA. LVEDP 10, HTN, HLD, chronic DOE followed by Pulmonary   LOV Dr. [name] 07/2020 she was having infrequent chest pains at times, right sided, chronic ongoing SOB unchanged and left calf pain with walking-ABI's normal. Has had recent GI complaints per pulm and PCP notes and started on pantoprazole.   I saw the patient 11/2020 complaining of epigastric pain around her right abdomen and indigestion.  Chronic shortness of breath seem to be improving.  She was walking on a treadmill 30 minutes daily and denied chest pain.  Blood pressure was high and she was very anxious.  I increased clonidine to 0.3 mg nightly and asked her to monitor closely at home.  I also referred her to GI.  She underwent endoscopy 01/18/21 and was found to have small hiatal hernia with erythematous mucosa in the gastric fundus that was biopsied and positive for H. Pylori.  Patient comes in for f/u. No further indigestion or GI pain. Chronic DOE unchanged. BP doing much better. Running out of clonidine and pharmacy won't fill until July.     Past Medical History:  Diagnosis Date  . 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 Anhedonia   . Anxiety   . CAD (coronary artery disease)    a. s/p DESx2 to proximal LAD in 06/2018 b. cath in 04/2019 showing 40% mid-LAD ISR with no significant stenosis along LM, LCx, or RCA  . Cystitis   . Elevated  liver enzymes   . Fatty liver   . GERD (gastroesophageal reflux disease)   . Headache    migraine  . Hyperlipidemia   . Hypertension   . Insomnia   . Osteoarthritis   . Unspecified hypothyroidism   . Vaginitis   . Wears glasses   . Wears hearing aid    B/L    Past Surgical History:  Procedure Laterality Date  . BIOPSY N/A 01/18/2021   Procedure: BIOPSY;  Surgeon: Harvel Quale, MD;  [facility];  Service: Gastroenterology;  Laterality: N/A;  . CATARACT EXTRACTION W/PHACO Right 05/08/2019   Procedure: CATARACT EXTRACTION PHACO AND INTRAOCULAR LENS PLACEMENT (IOC);  Surgeon: Baruch Goldmann, MD;  Location: [facility];  Service: Ophthalmology;  Laterality: Right;  CDE: 10.02  . COLONOSCOPY N/A 02/08/2016   Procedure: COLONOSCOPY;  Surgeon: [name], MD;  [facility];  Service: Endoscopy;  Laterality: N/A;  1200 - moved to 11:45 - Ann to notify  . CORONARY STENT INTERVENTION N/A 06/06/2018   Procedure: CORONARY STENT INTERVENTION;  Surgeon: [name], [name], MD;  Location: [facility];  Service: Cardiovascular;  Laterality: N/A;  . ESOPHAGOGASTRODUODENOSCOPY (EGD) WITH PROPOFOL N/A 01/18/2021   Procedure: ESOPHAGOGASTRODUODENOSCOPY (EGD) WITH PROPOFOL;  Surgeon: Harvel Quale, MD;  [facility];  Service: Gastroenterology;  Laterality: N/A;  10:30AM  .  INTRAVASCULAR PRESSURE WIRE/FFR STUDY N/A 06/06/2018   Procedure: INTRAVASCULAR PRESSURE WIRE/FFR STUDY;  Surgeon: [name], [name], MD;  Location: [facility];  Service: Cardiovascular;  Laterality: N/A;  . IR ANGIO INTRA EXTRACRAN SEL COM CAROTID INNOMINATE BILAT MOD SED  09/01/2019  . IR ANGIO INTRA EXTRACRAN SEL INTERNAL CAROTID UNI L MOD SED  12/21/2019  . IR ANGIO VERTEBRAL SEL VERTEBRAL BILAT MOD SED  09/01/2019  . IR ANGIOGRAM FOLLOW UP STUDY  12/21/2019  . IR TRANSCATH/EMBOLIZ  12/21/2019  . LEFT HEART CATH AND CORONARY ANGIOGRAPHY N/A 04/24/2019   Procedure: LEFT HEART  CATH AND CORONARY ANGIOGRAPHY;  Surgeon: Belva Crome, MD;  Location: [facility];  Service: Cardiovascular;  Laterality: N/A;  . LEFT HEART CATHETERIZATION WITH CORONARY ANGIOGRAM N/A 11/19/2014   Procedure: LEFT HEART CATHETERIZATION WITH CORONARY ANGIOGRAM;  Surgeon: [name], MD;  [facility]: [facility];  Service: Cardiovascular;  Laterality: N/A;  . RADIOLOGY WITH ANESTHESIA N/A 11/23/2019   Procedure: Drucilla Schmidt;  Surgeon: Luanne Bras, MD;  Location: [facility];  Service: Radiology;  Laterality: N/A;  . RADIOLOGY WITH ANESTHESIA N/A 12/21/2019   Procedure: EMBOLIZATION;  Surgeon: Luanne Bras, MD;  Location: [facility];  Service: Radiology;  Laterality: N/A;  . RIGHT/LEFT HEART CATH AND CORONARY ANGIOGRAPHY N/A 06/06/2018   Procedure: RIGHT/LEFT HEART CATH AND CORONARY ANGIOGRAPHY;  Surgeon: [name], [name], MD;  Location: [facility];  Service: Cardiovascular;  Laterality: N/A;    Current Medications: Current Meds  Medication Sig  . acetaminophen (TYLENOL) 500 MG tablet Take 1,000 mg by mouth every 6 (six) hours as needed for moderate pain.  Marland Kitchen amLODipine (NORVASC) 10 MG tablet Take 1 tablet (10 mg total) by mouth daily.  Marland Kitchen aspirin EC 81 MG tablet Take 81 mg by mouth daily. Swallow whole.  . busPIRone (BUSPAR) 5 MG tablet   . clopidogrel (PLAVIX) 75 MG tablet Take 1 tablet (75 mg total) by mouth daily.  Marland Kitchen escitalopram (LEXAPRO) 10 MG tablet escitalopram 10 mg tablet  TAKE 1 TABLET BY MOUTH ONCE DAILY  . metoprolol succinate (TOPROL-XL) 25 MG 24 hr tablet Take 0.5 tablets (12.5 mg total) by mouth daily.  Marland Kitchen omeprazole (PRILOSEC) 40 MG capsule Take 1 capsule (40 mg total) by mouth daily.  Marland Kitchen PARoxetine (PAXIL) 10 MG tablet Take 1 tablet (10 mg total) by mouth daily.  . potassium chloride SA (KLOR-CON) 20 MEQ tablet Take 1 tablet (20 mEq total) by mouth daily.  . prednisoLONE acetate (PRED FORTE) 1 % ophthalmic suspension prednisolone acetate 1 % eye drops,suspension   INSTILL 1 DROP INTO RIGHT EYE EVERY 8 HOURS SHAKE WELL  . Rimegepant Sulfate (NURTEC) 75 MG TBDP Take 1 tablet by mouth daily as needed (Migraine headache).  . rosuvastatin (CRESTOR) 40 MG tablet TAKE 1 TABLET BY MOUTH  DAILY  . Simethicone 80 MG TABS Take 1 tablet (80 mg total) by mouth 2 (two) times daily as needed.  . temazepam (RESTORIL) 15 MG capsule TAKE 1 CAPSULE BY MOUTH AT BEDTIME AS NEEDED FOR SLEEP  . topiramate (TOPAMAX) 25 MG tablet Take 50 mg by mouth daily.  . [DISCONTINUED] cloNIDine (CATAPRES) 0.3 MG tablet Take 1 tablet (0.3 mg total) by mouth at bedtime.  . [DISCONTINUED] nitroGLYCERIN (NITROSTAT) 0.4 MG SL tablet Place 1 tablet (0.4 mg total) under the tongue every 5 (five) minutes as needed.     Allergies:   Patient has no known allergies.   Social History   Socioeconomic History  .  Marital status: Married    Spouse name: Not on file  . Number of children: Not on file  . Years of education: Not on file  . Highest education level: Not on file  Occupational History  . Not on file  Tobacco Use  . Smoking status: Never Smoker  . Smokeless tobacco: Never Used  Vaping Use  . Vaping Use: Never used  Substance and Sexual Activity  . Alcohol use: No    Alcohol/week: 0.0 standard drinks  . Drug use: No  . Sexual activity: Not on file  Other Topics Concern  . Not on file  Social History Narrative   Pt originally from the [place] been in [place] for 20 years     Social Determinants of Radio broadcast assistant Strain: Not on file  Food Insecurity: Not on file  Transportation Needs: Not on file  Physical Activity: Not on file  Stress: Not on file  Social Connections: Not on file     Family History:  The patient's family history includes Dementia in her mother; Heart attack in her father; Heart disease in her father and mother; Hypertension in her father and mother.   ROS:   Please see the history of present illness.    ROS All other systems reviewed and are  negative.   PHYSICAL EXAM:   VS:  BP 126/76   Pulse 72   Ht 5\' 4"  (1.626 m)   Wt 133 lb (60.3 kg)   SpO2 98%   BMI 22.83 kg/m   Physical Exam  GEN: Well nourished, well developed, in no acute distress  Neck: no JVD, carotid bruits, or masses Cardiac:RRR; no murmurs, rubs, or gallops  Respiratory:  clear to auscultation bilaterally, normal work of breathing GI: soft, nontender, nondistended, + BS Ext: without cyanosis, clubbing, or edema, Good distal pulses bilaterally Neuro:  Alert and Oriented x 3 Psych: euthymic mood, full affect  Wt Readings from Last 3 Encounters:  02/14/21 133 lb (60.3 kg)  01/26/21 136 lb 1.9 oz (61.7 kg)  01/18/21 136 lb 14.5 oz (62.1 kg)      Studies/Labs Reviewed:   EKG:  EKG is not ordered today.   Recent Labs: 01/04/2021: ALT 14; BUN 14; Creatinine, Ser 0.75; Hemoglobin 12.6; Platelets 287; Potassium 4.6; Sodium 143; TSH 1.880   Lipid Panel    Component Value Date/Time   CHOL 130 01/04/2021 1123   TRIG 63 01/04/2021 1123   HDL 67 01/04/2021 1123   CHOLHDL 1.9 01/04/2021 1123   CHOLHDL 2.0 01/27/2020 1022   VLDL 11 06/29/2019 0946   LDLCALC 50 01/04/2021 1123   LDLCALC 44 01/27/2020 1022    Additional studies/ records that were reviewed today include:  ABI's 08/18/20 Summary:  Right: Resting right ankle-brachial index is within normal range. No  evidence of significant right lower extremity arterial disease. The right  toe-brachial index is normal.   Left: Resting left ankle-brachial index is within normal range. No  evidence of significant left lower extremity arterial disease. The left  toe-brachial index is normal.    Cardiac Catheterization: 04/2019  40% eccentric mid LAD in-stent restenosis.  At completion of stent procedure in September 2020, there was residual stent deformity in this region.  There is no high-grade obstruction in the LAD stent.  The LAD is otherwise normal.  Normal left main  Normal circumflex  Normal  RCA  Normal LV systolic function and end-diastolic pressure   RECOMMENDATIONS:    Dyspnea cannot be explained by  findings on today's study based on absence of significant coronary obstruction and normal LV hemodynamics.  Continue dual antiplatelet therapy beyond 12 months due to overlapping stents in the LAD.   Echocardiogram: 06/2019 IMPRESSIONS     1. Left ventricular ejection fraction, by visual estimation, is 55 to  60%. The left ventricle has normal function. There is no left ventricular  hypertrophy.   2. Left ventricular diastolic Doppler parameters are consistent with  impaired relaxation pattern of LV diastolic filling.   3. Global right ventricle has normal systolic function.The right  ventricular size is normal. No increase in right ventricular wall  thickness.   4. Left atrial size was normal.   5. Right atrial size was normal.   6. The mitral valve is normal in structure. Trace mitral valve  regurgitation. No evidence of mitral stenosis.   7. The tricuspid valve is normal in structure. Tricuspid valve  regurgitation was not visualized by color flow Doppler.   8. The aortic valve is tricuspid Aortic valve regurgitation was not  visualized by color flow Doppler. Mild aortic valve sclerosis without  stenosis.   9. The pulmonic valve was not well visualized. Pulmonic valve  regurgitation is not visualized by color flow Doppler.  10. Normal pulmonary artery systolic pressure.  11. The inferior vena cava IVC is small, suggesting low RA pressure and  hypovolemia.    Long-term Monitor: 01/2020  Sinus rhythm with rare PACs and PVCs and short bursts of SVT (possibly atrial tachycardia).        Risk Assessment/Calculations:         ASSESSMENT:    1. Coronary artery disease involving native coronary artery of native heart without angina pectoris   2. Dyspnea on exertion   3. Essential hypertension   4. Other hyperlipidemia   5. Gastroesophageal reflux  disease, unspecified whether esophagitis present      PLAN:  In order of problems listed above:  CAD s/p DESx2 to proximal LAD in 06/2018 with cath in 04/2019 showing 40% mid-LAD ISR with no significant stenosis along LM, LCx, or RCA. LVEDP 10. No chest pain and normal EKG.    DOE Chronic followed by [employer] on treadmill 30 min daily.   HTN BP normal today. Doing better on  Increased clonidine 0.3 mg qHS    HLD-on Crestor LDL 44 12/2019   GERD and abdominal pain-Endo with biopsy confirmed small hiatal hernia with H. Pylori-much improved after treatment.  Shared Decision Making/Informed Consent        Medication Adjustments/Labs and Tests Ordered: Current medicines are reviewed at length with the patient today.  Concerns regarding medicines are outlined above.  Medication changes, Labs and Tests ordered today are listed in the Patient Instructions below. Patient Instructions  Medication Instructions:  Continue all current medications.  Labwork: none  Testing/Procedures: none  Follow-Up: 4-5 months   Any Other Special Instructions Will Be Listed Below (If Applicable).  If you need a refill on your cardiac medications before your next appointment, please call your pharmacy.     Sumner Boast, PA-C  02/14/2021 11:57 AM    [facility] [address], [place], [place]  [address] Phone: [phone]; Fax: [phone]

## 2021-02-10 ENCOUNTER — Other Ambulatory Visit: Payer: Self-pay

## 2021-02-10 ENCOUNTER — Ambulatory Visit (HOSPITAL_COMMUNITY)
Admission: RE | Admit: 2021-02-10 | Discharge: 2021-02-10 | Disposition: A | Discharge status: Home / Self Care | Payer: Medicare Other | Source: Ambulatory Visit | Attending: Internal Medicine | Admitting: Internal Medicine

## 2021-02-10 DIAGNOSIS — G441 Vascular headache, not elsewhere classified: Secondary | ICD-10-CM | POA: Diagnosis not present

## 2021-02-10 DIAGNOSIS — R519 Headache, unspecified: Secondary | ICD-10-CM | POA: Diagnosis not present

## 2021-02-14 ENCOUNTER — Other Ambulatory Visit: Payer: Self-pay

## 2021-02-14 ENCOUNTER — Encounter: Payer: Self-pay | Admitting: Physician Assistant

## 2021-02-14 ENCOUNTER — Ambulatory Visit (INDEPENDENT_AMBULATORY_CARE_PROVIDER_SITE_OTHER): Payer: Medicare Other | Admitting: Physician Assistant

## 2021-02-14 VITALS — BP 126/76 | HR 72 | Ht 64.0 in | Wt 133.0 lb

## 2021-02-14 DIAGNOSIS — I10 Essential (primary) hypertension: Secondary | ICD-10-CM | POA: Diagnosis not present

## 2021-02-14 DIAGNOSIS — E7849 Other hyperlipidemia: Secondary | ICD-10-CM | POA: Diagnosis not present

## 2021-02-14 DIAGNOSIS — R0600 Dyspnea, unspecified: Secondary | ICD-10-CM

## 2021-02-14 DIAGNOSIS — K219 Gastro-esophageal reflux disease without esophagitis: Secondary | ICD-10-CM | POA: Diagnosis not present

## 2021-02-14 DIAGNOSIS — I2510 Atherosclerotic heart disease of native coronary artery without angina pectoris: Secondary | ICD-10-CM

## 2021-02-14 DIAGNOSIS — R0609 Other forms of dyspnea: Secondary | ICD-10-CM

## 2021-02-14 MED ORDER — PAROXETINE HCL 10 MG PO TABS: 10.0000 mg | ORAL_TABLET | Freq: Every day | ORAL | 5 refills | Status: DC

## 2021-02-14 MED ORDER — POTASSIUM CHLORIDE CRYS ER 20 MEQ PO TBCR: 20.0000 meq | EXTENDED_RELEASE_TABLET | Freq: Every day | ORAL | 5 refills | Status: DC

## 2021-02-14 MED ORDER — CLONIDINE HCL 0.3 MG PO TABS: 0.3000 mg | ORAL_TABLET | Freq: Every day | ORAL | 3 refills | Status: DC

## 2021-02-14 MED ORDER — AMLODIPINE BESYLATE 10 MG PO TABS: 10.0000 mg | ORAL_TABLET | Freq: Every day | ORAL | 5 refills | Status: DC

## 2021-02-14 MED ORDER — NITROGLYCERIN 0.4 MG SL SUBL: 0.4000 mg | SUBLINGUAL_TABLET | SUBLINGUAL | 3 refills | Status: AC | PRN

## 2021-02-14 NOTE — Patient Instructions (Signed)
Medication Instructions:  Continue all current medications.  Labwork: none  Testing/Procedures: none  Follow-Up: 4-5 months   Any Other Special Instructions Will Be Listed Below (If Applicable).  If you need a refill on your cardiac medications before your next appointment, please call your pharmacy.

## 2021-02-15 ENCOUNTER — Ambulatory Visit (HOSPITAL_COMMUNITY)
Admission: RE | Admit: 2021-02-15 | Discharge: 2021-02-15 | Disposition: A | Discharge status: Home / Self Care | Payer: Medicare Other | Source: Ambulatory Visit | Attending: Gastroenterology | Admitting: Gastroenterology

## 2021-02-15 ENCOUNTER — Encounter (HOSPITAL_COMMUNITY): Payer: Self-pay | Admitting: Radiology

## 2021-02-15 DIAGNOSIS — R109 Unspecified abdominal pain: Secondary | ICD-10-CM | POA: Diagnosis not present

## 2021-02-15 LAB — POCT I-STAT CREATININE: Creatinine, Ser: 0.6 mg/dL (ref 0.44–1.00)

## 2021-02-15 MED ORDER — IOHEXOL 300 MG/ML  SOLN
100.0000 mL | Freq: Once | INTRAMUSCULAR | Status: AC | PRN
  Administered 2021-02-15: 100 mL via INTRAVENOUS

## 2021-02-15 MED ORDER — IOHEXOL 300 MG/ML  SOLN
75.0000 mL | Freq: Once | INTRAMUSCULAR | Status: AC | PRN
  Administered 2021-02-15: 75 mL via INTRAVENOUS

## 2021-02-16 ENCOUNTER — Ambulatory Visit (INDEPENDENT_AMBULATORY_CARE_PROVIDER_SITE_OTHER): Payer: PRIVATE HEALTH INSURANCE | Admitting: Gastroenterology

## 2021-02-20 ENCOUNTER — Other Ambulatory Visit: Payer: Self-pay

## 2021-02-20 DIAGNOSIS — R12 Heartburn: Secondary | ICD-10-CM

## 2021-02-20 MED ORDER — OMEPRAZOLE 40 MG PO CPDR: 40.0000 mg | DELAYED_RELEASE_CAPSULE | Freq: Every day | ORAL | 3 refills | Status: DC

## 2021-02-28 ENCOUNTER — Ambulatory Visit (INDEPENDENT_AMBULATORY_CARE_PROVIDER_SITE_OTHER): Payer: Medicare Other | Admitting: Family Medicine

## 2021-02-28 ENCOUNTER — Other Ambulatory Visit: Payer: Self-pay

## 2021-02-28 ENCOUNTER — Encounter: Payer: Self-pay | Admitting: Family Medicine

## 2021-02-28 VITALS — BP 136/74 | HR 65 | Resp 16 | Ht 64.0 in | Wt 134.1 lb

## 2021-02-28 DIAGNOSIS — N3000 Acute cystitis without hematuria: Secondary | ICD-10-CM | POA: Diagnosis not present

## 2021-02-28 DIAGNOSIS — N3001 Acute cystitis with hematuria: Secondary | ICD-10-CM

## 2021-02-28 DIAGNOSIS — F419 Anxiety disorder, unspecified: Secondary | ICD-10-CM

## 2021-02-28 DIAGNOSIS — R350 Frequency of micturition: Secondary | ICD-10-CM | POA: Diagnosis not present

## 2021-02-28 DIAGNOSIS — F5101 Primary insomnia: Secondary | ICD-10-CM | POA: Diagnosis not present

## 2021-02-28 DIAGNOSIS — N3946 Mixed incontinence: Secondary | ICD-10-CM

## 2021-02-28 DIAGNOSIS — M79642 Pain in left hand: Secondary | ICD-10-CM | POA: Diagnosis not present

## 2021-02-28 DIAGNOSIS — M79641 Pain in right hand: Secondary | ICD-10-CM | POA: Diagnosis not present

## 2021-02-28 DIAGNOSIS — M79643 Pain in unspecified hand: Secondary | ICD-10-CM | POA: Insufficient documentation

## 2021-02-28 DIAGNOSIS — G43819 Other migraine, intractable, without status migrainosus: Secondary | ICD-10-CM | POA: Diagnosis not present

## 2021-02-28 DIAGNOSIS — E7849 Other hyperlipidemia: Secondary | ICD-10-CM | POA: Diagnosis not present

## 2021-02-28 DIAGNOSIS — G8929 Other chronic pain: Secondary | ICD-10-CM | POA: Diagnosis not present

## 2021-02-28 DIAGNOSIS — M5442 Lumbago with sciatica, left side: Secondary | ICD-10-CM

## 2021-02-28 DIAGNOSIS — I10 Essential (primary) hypertension: Secondary | ICD-10-CM

## 2021-02-28 LAB — POCT URINALYSIS DIP (CLINITEK)
Bilirubin, UA: NEGATIVE
Blood, UA: NEGATIVE
Glucose, UA: NEGATIVE mg/dL
Ketones, POC UA: NEGATIVE mg/dL
Nitrite, UA: NEGATIVE
POC PROTEIN,UA: NEGATIVE
Spec Grav, UA: 1.01 (ref 1.010–1.025)
Urobilinogen, UA: NEGATIVE E.U./dL — AB
pH, UA: 7 (ref 5.0–8.0)

## 2021-02-28 NOTE — Patient Instructions (Addendum)
F/u in 6 weeks, call if you need me sooner  You are referred  to Rheumatology   you need to physically bring your medication so nurse can goo over them with you and update your record acurately. She will arrange time with you at checkout  Please get X ray of low back today , due to left lower leg and back pain  Urine is being sent for additional testing, I do nOT believve that you have a bladder infection  Thanks for choosing [facility], we consider it a privelige to serve you.

## 2021-02-28 NOTE — Assessment & Plan Note (Addendum)
Bilateral hand dpain and joint deformity refer to Rheumatology, reports rediuced functioin

## 2021-03-01 ENCOUNTER — Ambulatory Visit: Payer: Medicare Other

## 2021-03-01 DIAGNOSIS — E785 Hyperlipidemia, unspecified: Secondary | ICD-10-CM

## 2021-03-01 NOTE — Progress Notes (Signed)
meds reviewed and updated in her chart.

## 2021-03-03 ENCOUNTER — Other Ambulatory Visit: Payer: Self-pay | Admitting: Family Medicine

## 2021-03-03 MED ORDER — SULFAMETHOXAZOLE-TRIMETHOPRIM 400-80 MG PO TABS: 1.0000 | ORAL_TABLET | Freq: Two times a day (BID) | ORAL | 0 refills | Status: DC

## 2021-03-04 LAB — URINE CULTURE

## 2021-03-05 ENCOUNTER — Other Ambulatory Visit: Payer: Self-pay | Admitting: Family Medicine

## 2021-03-06 ENCOUNTER — Other Ambulatory Visit: Payer: Self-pay

## 2021-03-06 MED ORDER — NITROFURANTOIN MONOHYD MACRO 100 MG PO CAPS
100.0000 mg | ORAL_CAPSULE | Freq: Two times a day (BID) | ORAL | 0 refills | Status: DC
Start: 2021-03-06 — End: 2021-03-30

## 2021-03-07 DIAGNOSIS — R350 Frequency of micturition: Secondary | ICD-10-CM | POA: Insufficient documentation

## 2021-03-07 DIAGNOSIS — M5442 Lumbago with sciatica, left side: Secondary | ICD-10-CM | POA: Insufficient documentation

## 2021-03-07 NOTE — Assessment & Plan Note (Signed)
Needs X ray of low back, likely has arthritis with disc disease

## 2021-03-07 NOTE — Assessment & Plan Note (Signed)
Controlled, no change in medication DASH diet and commitment to daily physical activity for a minimum of 30 minutes discussed and encouraged, as a part of hypertension management. The importance of attaining a healthy weight is also discussed.  BP/Weight 02/28/2021 02/14/2021 01/26/2021 01/18/2021 01/12/2021 01/02/2021 10/27/5168  Systolic BP 017 494 496 759 163 846 659  Diastolic BP 74 76 80 57 79 67 80  Wt. (Lbs) 134.12 133 136.12 136.91 137 135.12 138.4  BMI 23.02 22.83 23.36 23.5 23.52 23.19 23.76

## 2021-03-07 NOTE — Assessment & Plan Note (Signed)
Hyperlipidemia:Low fat diet discussed and encouraged.   Lipid Panel  Lab Results  Component Value Date   CHOL 130 01/04/2021   HDL 67 01/04/2021   LDLCALC 50 01/04/2021   TRIG 63 01/04/2021   CHOLHDL 1.9 01/04/2021   Controlled, no change in medication

## 2021-03-07 NOTE — Assessment & Plan Note (Signed)
Sleep hygiene reviewed and written information offered also. Prescription sent for  medication needed.  

## 2021-03-07 NOTE — Assessment & Plan Note (Signed)
Reduced frequency on topamax

## 2021-03-07 NOTE — Progress Notes (Signed)
   Tanya [name]     MRN: [id number]      DOB: [birth year]   HPI [name] is here for follow up and re-evaluation of chronic medical conditions, medication management and review of any available recent lab and radiology data.  Preventive health is updated, specifically  Cancer screening and Immunization.   Questions or concerns regarding consultations or procedures which the PT has had in the interim are  addressed. The PT denies any adverse reactions to current medications since the last visit.  Ongoing and worsening bilateral hand pain with weakness and swelling awaiting rheumatology eval C/o LBP radiating down left hip and leg Needs medication review, will bring medication later today or tomorrow  ROS Denies recent fever or chills. Denies sinus pressure, nasal congestion, ear pain or sore throat. Denies chest congestion, productive cough or wheezing. Denies chest pains, palpitations and leg swelling Denies abdominal pain, nausea, vomiting,diarrhea or constipation.   .  Denies headaches, seizures, numbness, or tingling. Denies depression, uncontrolled anxiety or insomnia. Denies skin break down or rash.   PE  BP 136/74   Pulse 65   Resp 16   Ht 5\' 4"  (1.626 m)   Wt 134 lb 1.9 oz (60.8 kg)   SpO2 98%   BMI 23.02 kg/m   Patient alert and oriented and in no cardiopulmonary distress.  HEENT: No facial asymmetry, EOMI,     Neck supple .  Chest: Clear to auscultation bilaterally.  CVS: S1, S2 no murmurs, no S3.Regular rate.  ABD: Soft non tender.   Ext: No edema  MS: Decreased  ROM lumbar spine,adequate in  shoulders, hips and knees.Deformity of digits and mild reduction in hand grips  Skin: Intact, no ulcerations or rash noted.  Psych: Good eye contact, normal affect. Memory intact not anxious or depressed appearing.  CNS: CN 2-12 intact, power,  normal throughout.no focal deficits noted.   Assessment & Plan  Hand pain Bilateral hand dpain and joint  deformity refer to Rheumatology, reports rediuced functioin  Essential hypertension Controlled, no change in medication DASH diet and commitment to daily physical activity for a minimum of 30 minutes discussed and encouraged, as a part of hypertension management. The importance of attaining a healthy weight is also discussed.  BP/Weight 02/28/2021 02/14/2021 01/26/2021 01/18/2021 01/12/2021 01/02/2021 03/07/3015  Systolic BP 010 932 355 732 202 542 706  Diastolic BP 74 76 80 57 79 67 80  Wt. (Lbs) 134.12 133 136.12 136.91 137 135.12 138.4  BMI 23.02 22.83 23.36 23.5 23.52 23.19 23.76       Hyperlipemia Hyperlipidemia:Low fat diet discussed and encouraged.   Lipid Panel  Lab Results  Component Value Date   CHOL 130 01/04/2021   HDL 67 01/04/2021   LDLCALC 50 01/04/2021   TRIG 63 01/04/2021   CHOLHDL 1.9 01/04/2021   Controlled, no change in medication     Urinary frequency Abn CCUA will f/u c/S  Anxiety Continue paxil, improved  Headache Reduced frequency on topamax  Insomnia Sleep hygiene reviewed and written information offered also. Prescription sent for  medication needed.   Migraine Improved with topamax daily  Low back pain with left-sided sciatica Needs X ray of low back, likely has arthritis with disc disease

## 2021-03-07 NOTE — Assessment & Plan Note (Signed)
Abn CCUA will f/u c/S

## 2021-03-07 NOTE — Assessment & Plan Note (Signed)
Improved with topamax daily

## 2021-03-07 NOTE — Assessment & Plan Note (Signed)
Continue paxil, improved

## 2021-03-16 NOTE — Progress Notes (Signed)
Office Visit Note  Patient: Tanya [name]             Date of Birth: 1950/12/30           MRN: [id number]             PCP: [name], MD Referring: [name], MD Visit Date: 03/30/2021 Occupation: @GUAROCC @  Subjective:  Other (Patient reports left knee/leg pain and swelling and bilateral hand pain/swelling )   History of Present Illness: Tanya [name] is a 70 y.o. female with a history of osteoarthritis.  She states she continues to have pain and swelling in her hands.  She states for the last 3 weeks she has been having pain and swelling in her left knee joint.  She has been having difficulty walking due to left knee joint swelling.  She continues to have some lower back and stiffness.  Activities of Daily Living:  Patient reports morning stiffness for 10 minutes.   Patient Reports nocturnal pain.  Difficulty dressing/grooming: Denies Difficulty climbing stairs: Reports Difficulty getting out of chair: Denies Difficulty using hands for taps, buttons, cutlery, and/or writing: Reports  Review of Systems  Constitutional:  Negative for fatigue.  HENT:  Positive for mouth dryness and nose dryness. Negative for mouth sores.   Eyes:  Negative for pain, itching and dryness.  Respiratory:  Positive for shortness of breath. Negative for difficulty breathing.   Cardiovascular:  Negative for chest pain and palpitations.  Gastrointestinal:  Negative for blood in stool, constipation and diarrhea.  Endocrine: Negative for increased urination.  Genitourinary:  Negative for difficulty urinating.  Musculoskeletal:  Positive for joint pain, joint pain, joint swelling, myalgias, morning stiffness, muscle tenderness and myalgias.  Skin:  Negative for color change, rash and redness.  Allergic/Immunologic: Negative for susceptible to infections.  Neurological:  Positive for numbness, headaches and memory loss. Negative for dizziness and weakness.  Hematological:  Positive  for bruising/bleeding tendency.  Psychiatric/Behavioral:  Negative for confusion.    PMFS History:  Patient Active Problem List   Diagnosis Date Noted   Urinary frequency 03/07/2021   Low back pain with left-sided sciatica 03/07/2021   Hand pain 02/28/2021   H. pylori infection 01/26/2021   Migraine 01/26/2021   Light headed 01/15/2021   IBS (irritable bowel syndrome) 01/12/2021   Abdominal pain, chronic, epigastric 01/12/2021   Right trigeminal neuralgia 12/06/2020   History of cerebral aneurysm 10/03/2020   Left hip pain 10/03/2020   Abdominal bloating 10/03/2020   Chest pain on exertion 03/29/2020   Brain aneurysm 12/21/2019   Aneurysm, carotid artery, internal 10/25/2019   Sensory disturbance 06/29/2019   CAD (coronary artery disease), native coronary artery 06/06/2018   Headache 03/08/2018   Primary osteoarthritis of both hands 02/13/2018   Primary osteoarthritis of both knees 02/13/2018   Anxiety 02/06/2017   Arthritis 07/20/2016   Tubular adenoma of colon 02/11/2016   Coronary artery calcification seen on CAT scan - Moderate LAD lesion noted 11/16/2014   Abnormal CT scan, chest 11/08/2014   IGT (impaired glucose tolerance) 06/11/2014   Osteopenia 01/27/2014   Dyspnea 01/19/2014   Microscopic hematuria 01/05/2013   Urinary incontinence, mixed 01/05/2013   GERD (gastroesophageal reflux disease) 12/16/2009   Hyperlipemia 04/09/2008   Essential hypertension 04/09/2008   Insomnia 04/09/2008    Past Medical History:  Diagnosis Date   Aneurysm ([place])    left ICA 09/2019   Anhedonia    Anxiety    CAD (coronary artery disease)  a. s/p DESx2 to proximal LAD in 06/2018 b. cath in 04/2019 showing 40% mid-LAD ISR with no significant stenosis along LM, LCx, or RCA   Cystitis    Elevated liver enzymes    Fatty liver    GERD (gastroesophageal reflux disease)    Headache    migraine   Hyperlipidemia    Hypertension    Insomnia    Osteoarthritis    Unspecified  hypothyroidism    Vaginitis    Wears glasses    Wears hearing aid    B/L    Family History  Problem Relation Age of Onset   Dementia Mother    Heart disease Mother        heart attack    Hypertension Mother    Heart attack Father    Heart disease Father        heart attack    Hypertension Father    Past Surgical History:  Procedure Laterality Date   BIOPSY N/A 01/18/2021   Procedure: BIOPSY;  Surgeon: Harvel Quale, MD;  [facility];  Service: Gastroenterology;  Laterality: N/A;   CATARACT EXTRACTION W/PHACO Right 05/08/2019   Procedure: CATARACT EXTRACTION PHACO AND INTRAOCULAR LENS PLACEMENT (IOC);  Surgeon: Baruch Goldmann, MD;  Location: [facility];  Service: Ophthalmology;  Laterality: Right;  CDE: 10.02   COLONOSCOPY N/A 02/08/2016   Procedure: COLONOSCOPY;  Surgeon: [name], MD;  [facility];  Service: Endoscopy;  Laterality: N/A;  1200 - moved to 11:45 - Ann to notify   CORONARY STENT INTERVENTION N/A 06/06/2018   Procedure: CORONARY STENT INTERVENTION;  Surgeon: [name], [name], MD;  Location: [place] [facility];  Service: Cardiovascular;  Laterality: N/A;   ESOPHAGOGASTRODUODENOSCOPY (EGD) WITH PROPOFOL N/A 01/18/2021   Procedure: ESOPHAGOGASTRODUODENOSCOPY (EGD) WITH PROPOFOL;  Surgeon: Harvel Quale, MD;  [facility];  Service: Gastroenterology;  Laterality: N/A;  10:30AM   INTRAVASCULAR PRESSURE WIRE/FFR STUDY N/A 06/06/2018   Procedure: INTRAVASCULAR PRESSURE WIRE/FFR STUDY;  Surgeon: [name], [name], MD;  Location: [place] [facility];  Service: Cardiovascular;  Laterality: N/A;   IR ANGIO INTRA EXTRACRAN SEL COM CAROTID INNOMINATE BILAT MOD SED  09/01/2019   IR ANGIO INTRA EXTRACRAN SEL INTERNAL CAROTID UNI L MOD SED  12/21/2019   IR ANGIO VERTEBRAL SEL VERTEBRAL BILAT MOD SED  09/01/2019   IR ANGIOGRAM FOLLOW UP STUDY  12/21/2019   IR TRANSCATH/EMBOLIZ  12/21/2019   LEFT HEART CATH AND CORONARY ANGIOGRAPHY N/A  04/24/2019   Procedure: LEFT HEART CATH AND CORONARY ANGIOGRAPHY;  Surgeon: Belva Crome, MD;  Location: [place] [facility];  Service: Cardiovascular;  Laterality: N/A;   LEFT HEART CATHETERIZATION WITH CORONARY ANGIOGRAM N/A 11/19/2014   Procedure: LEFT HEART CATHETERIZATION WITH CORONARY ANGIOGRAM;  Surgeon: [name], MD;  Location: [facility];  Service: Cardiovascular;  Laterality: N/A;   RADIOLOGY WITH ANESTHESIA N/A 11/23/2019   Procedure: Drucilla Schmidt;  Surgeon: Luanne Bras, MD;  Location: [place];  Service: Radiology;  Laterality: N/A;   RADIOLOGY WITH ANESTHESIA N/A 12/21/2019   Procedure: EMBOLIZATION;  Surgeon: Luanne Bras, MD;  Location: [place];  Service: Radiology;  Laterality: N/A;   RIGHT/LEFT HEART CATH AND CORONARY ANGIOGRAPHY N/A 06/06/2018   Procedure: RIGHT/LEFT HEART CATH AND CORONARY ANGIOGRAPHY;  Surgeon: [name], [name], MD;  Location: [place] [facility];  Service: Cardiovascular;  Laterality: N/A;   Social History   Social History Narrative   Pt originally from the [place] been in [place]  for 20 years     Immunization History  Administered Date(s) Administered   Fluad Quad(high Dose 65+) 06/16/2019   Influenza Whole 07/05/2008   Influenza,inj,Quad PF,6+ Mos 07/17/2013, 08/01/2014, 05/24/2016, 05/19/2020   Influenza-Unspecified 06/10/2017   Moderna Sars-Covid-2 Vaccination 12/07/2019, 01/05/2020, 09/28/2020   Pneumococcal Conjugate-13 11/08/2014   Pneumococcal Polysaccharide-23 09/06/2004, 03/30/2016   Td 01/21/2003   Tdap 07/17/2013   Zoster Recombinat (Shingrix) 03/25/2019   Zoster, Live 01/12/2011     Objective: Vital Signs: BP (!) 152/70 (BP Location: Right Arm, Patient Position: Sitting, Cuff Size: Normal)   Pulse 60   Ht 5\' 4"  (1.626 m)   Wt 134 lb 12.8 oz (61.1 kg)   BMI 23.14 kg/m    Physical Exam Vitals and nursing note reviewed.  Constitutional:      Appearance: She is well-developed.  HENT:     Head: Normocephalic and  atraumatic.  Eyes:     Conjunctiva/sclera: Conjunctivae normal.  Cardiovascular:     Rate and Rhythm: Normal rate and regular rhythm.     Heart sounds: Normal heart sounds.  Pulmonary:     Effort: Pulmonary effort is normal.     Breath sounds: Normal breath sounds.  Abdominal:     General: Bowel sounds are normal.     Palpations: Abdomen is soft.  Musculoskeletal:     Cervical back: Normal range of motion.  Lymphadenopathy:     Cervical: No cervical adenopathy.  Skin:    General: Skin is warm and dry.     Capillary Refill: Capillary refill takes less than 2 seconds.  Neurological:     Mental Status: She is alert and oriented to person, place, and time.  Psychiatric:        Behavior: Behavior normal.     Musculoskeletal Exam: She had good range of motion of her cervical spine.  She had some discomfort range of motion of lumbar spine.  Shoulder joints, elbow joints, wrist joints with good range of motion.  She had bilateral PIP DIP and CMC thickening consistent with osteoarthritis.  No synovitis was noted.  Hip joints with good range of motion.  Knee joints are in good range of motion without any warmth swelling or effusion.  She has pain and discomfort range of motion of her left knee joint.  Ankles and MTPs with good range of motion with no synovitis.  CDAI Exam: CDAI Score: -- Patient Global: --; Provider Global: -- Swollen: --; Tender: -- Joint Exam 03/30/2021   No joint exam has been documented for this visit   There is currently no information documented on the homunculus. Go to the Rheumatology activity and complete the homunculus joint exam.  Investigation: No additional findings.  Imaging: DG Lumbar Spine Complete  Result Date: 03/18/2021 CLINICAL DATA:  Lower back pain radiating into the left leg for several weeks, initial encounter EXAM: LUMBAR SPINE - COMPLETE 4+ VIEW COMPARISON:  None. FINDINGS: Five lumbar type vertebral bodies are well visualized. Vertebral  body height is well maintained. No pars defects are noted. Mild disc space narrowing is noted at L4-5 and to a lesser degree at L5-S1. Osteophytic changes at L4-5 are noted. Facet hypertrophic changes are seen without anterolisthesis. IMPRESSION: Degenerative change without acute abnormality. Electronically Signed   By: Inez Catalina M.D.   On: 03/18/2021 19:30    Recent Labs: Lab Results  Component Value Date   WBC 3.9 01/04/2021   HGB 12.6 01/04/2021   PLT 287 01/04/2021   NA 143 01/04/2021  K 4.6 01/04/2021   CL 108 (H) 01/04/2021   CO2 20 01/04/2021   GLUCOSE 98 01/04/2021   BUN 14 01/04/2021   CREATININE 0.60 02/15/2021   BILITOT 0.4 01/04/2021   ALKPHOS 62 01/04/2021   AST 24 01/04/2021   ALT 14 01/04/2021   PROT 7.2 01/04/2021   ALBUMIN 4.7 01/04/2021   CALCIUM 9.8 01/04/2021   GFRAA >60 03/28/2020    Speciality Comments: No specialty comments available.  Procedures:  Large Joint Inj: L knee on 03/30/2021 11:11 AM Indications: pain Details: 27 G 1.5 in needle, medial approach  Arthrogram: No  Medications: 40 mg triamcinolone acetonide 40 MG/ML; 1.5 mL lidocaine 1 % Aspirate: 0 mL Outcome: tolerated well, no immediate complications Procedure, treatment alternatives, risks and benefits explained, specific risks discussed. Consent was given by the patient. Immediately prior to procedure a time out was called to verify the correct patient, procedure, equipment, support staff and site/side marked as required. Patient was prepped and draped in the usual sterile fashion.    Allergies: Patient has no known allergies.   Assessment / Plan:     Visit Diagnoses: Primary osteoarthritis of both hands-she has severe PIP and DIP thickening and CMC prominence consistent with osteoarthritis.  Has been having discomfort mainly in her right third PIP joint.  I discussed that it could be injected in the future if she has ongoing discomfort.  Joint protection muscle strengthening was  discussed.  A handout on hand exercises was given.  Chronic pain of both knees-she did x-rays in 2019 which showed moderate osteoarthritis and moderate chondromalacia patella.  She has been experiencing increased pain and discomfort in her left knee joint.  She complains of swelling.  I do not see any warmth swelling or effusion.  Different treatment options were discussed.  Per her request left knee joint was injected with cortisone as described above.  She tolerated the procedure well.  Postprocedure instructions were given.  A handout on lower extremity exercises was given.  I offered referral to physical therapy which she declined.  Primary osteoarthritis of both feet-proper fitting shoes were discussed.  DDD lumbar spine-she has been experiencing some lower back discomfort.  She had x-rays done by her PCP recently which were consistent with degenerative disc disease.  Detailed counseling was provided.  A handout on lumbar spine exercises was given.  Pruritus - She is being followed by a dermatologist.  Osteopenia, unspecified location - she gets DEXA scan with her PCP.  Coronary artery calcification seen on CAT scan - Moderate LAD lesion noted  History of fatty infiltration of liver  Essential hypertension-blood pressure is elevated today.  Patient states is due to discomfort and increase in stress.  Advised to monitor blood pressure closely and follow-up with Dr. Moshe Cipro.  History of hyperlipidemia  Other fatigue  Primary insomnia  Orders: Orders Placed This Encounter  Procedures   Large Joint Inj    No orders of the defined types were placed in this encounter.  Face-to-face time spent patient was 30 minutes.  More than 50% time was spent in counseling and coordination of care.  Follow-Up Instructions: Return if symptoms worsen or fail to improve, for Osteoarthritis.   Bo Merino, MD  Note - This record has been created using Editor, commissioning.  Chart creation errors  have been sought, but may not always  have been located. Such creation errors do not reflect on  the standard of medical care.

## 2021-03-17 ENCOUNTER — Ambulatory Visit (HOSPITAL_COMMUNITY)
Admission: RE | Admit: 2021-03-17 | Discharge: 2021-03-17 | Disposition: A | Discharge status: Home / Self Care | Payer: Medicare Other | Source: Ambulatory Visit | Attending: Family Medicine | Admitting: Family Medicine

## 2021-03-17 ENCOUNTER — Other Ambulatory Visit: Payer: Self-pay

## 2021-03-17 DIAGNOSIS — G8929 Other chronic pain: Secondary | ICD-10-CM | POA: Diagnosis not present

## 2021-03-17 DIAGNOSIS — M5442 Lumbago with sciatica, left side: Secondary | ICD-10-CM | POA: Insufficient documentation

## 2021-03-17 DIAGNOSIS — M545 Low back pain, unspecified: Secondary | ICD-10-CM | POA: Diagnosis not present

## 2021-03-30 ENCOUNTER — Ambulatory Visit (INDEPENDENT_AMBULATORY_CARE_PROVIDER_SITE_OTHER): Payer: Medicare Other | Admitting: Rheumatology

## 2021-03-30 ENCOUNTER — Other Ambulatory Visit: Payer: Self-pay

## 2021-03-30 ENCOUNTER — Encounter: Payer: Self-pay | Admitting: Rheumatology

## 2021-03-30 VITALS — BP 152/70 | HR 60 | Ht 64.0 in | Wt 134.8 lb

## 2021-03-30 DIAGNOSIS — I2510 Atherosclerotic heart disease of native coronary artery without angina pectoris: Secondary | ICD-10-CM

## 2021-03-30 DIAGNOSIS — Z8719 Personal history of other diseases of the digestive system: Secondary | ICD-10-CM

## 2021-03-30 DIAGNOSIS — F5101 Primary insomnia: Secondary | ICD-10-CM | POA: Diagnosis not present

## 2021-03-30 DIAGNOSIS — Z8639 Personal history of other endocrine, nutritional and metabolic disease: Secondary | ICD-10-CM

## 2021-03-30 DIAGNOSIS — M8580 Other specified disorders of bone density and structure, unspecified site: Secondary | ICD-10-CM

## 2021-03-30 DIAGNOSIS — I10 Essential (primary) hypertension: Secondary | ICD-10-CM

## 2021-03-30 DIAGNOSIS — M5136 Other intervertebral disc degeneration, lumbar region: Secondary | ICD-10-CM | POA: Diagnosis not present

## 2021-03-30 DIAGNOSIS — M19071 Primary osteoarthritis, right ankle and foot: Secondary | ICD-10-CM | POA: Diagnosis not present

## 2021-03-30 DIAGNOSIS — R5383 Other fatigue: Secondary | ICD-10-CM

## 2021-03-30 DIAGNOSIS — M19072 Primary osteoarthritis, left ankle and foot: Secondary | ICD-10-CM

## 2021-03-30 DIAGNOSIS — M19041 Primary osteoarthritis, right hand: Secondary | ICD-10-CM | POA: Diagnosis not present

## 2021-03-30 DIAGNOSIS — M19042 Primary osteoarthritis, left hand: Secondary | ICD-10-CM

## 2021-03-30 DIAGNOSIS — L299 Pruritus, unspecified: Secondary | ICD-10-CM

## 2021-03-30 DIAGNOSIS — M170 Bilateral primary osteoarthritis of knee: Secondary | ICD-10-CM | POA: Diagnosis not present

## 2021-03-30 MED ORDER — TRIAMCINOLONE ACETONIDE 40 MG/ML IJ SUSP
40.0000 mg | INTRAMUSCULAR | Status: AC | PRN
  Administered 2021-03-30: 40 mg via INTRA_ARTICULAR

## 2021-03-30 MED ORDER — LIDOCAINE HCL 1 % IJ SOLN
1.5000 mL | INTRAMUSCULAR | Status: AC | PRN
  Administered 2021-03-30: 1.5 mL

## 2021-03-30 NOTE — Patient Instructions (Signed)
Journal for Nurse Practitioners, 15(4), 263-267. Retrieved July 07, 2018 from [email or web address]">  Knee Exercises Ask your health care provider which exercises are safe for you. Do exercises exactly as told by your health care provider and adjust them as directed. It is normal to feel mild stretching, pulling, tightness, or discomfort as you do these exercises. Stop right away if you feel sudden pain or your pain gets worse. Do not begin these exercises until told by your health care provider. Stretching and range-of-motion exercises These exercises warm up your muscles and joints and improve the movement and flexibility of your knee. These exercises also help to relieve pain andswelling. Knee extension, prone Lie on your abdomen (prone position) on a bed. Place your left / right knee just beyond the edge of the surface so your knee is not on the bed. You can put a towel under your left / right thigh just above your kneecap for comfort. Relax your leg muscles and allow gravity to straighten your knee (extension). You should feel a stretch behind your left / right knee. Hold this position for __________ seconds. Scoot up so your knee is supported between repetitions. Repeat __________ times. Complete this exercise __________ times a day. Knee flexion, active  Lie on your back with both legs straight. If this causes back discomfort, bend your left / right knee so your foot is flat on the floor. Slowly slide your left / right heel back toward your buttocks. Stop when you feel a gentle stretch in the front of your knee or thigh (flexion). Hold this position for __________ seconds. Slowly slide your left / right heel back to the starting position. Repeat __________ times. Complete this exercise __________ times a day. Quadriceps stretch, prone  Lie on your abdomen on a firm surface, such as a bed or padded floor. Bend your left / right knee and hold your ankle. If you cannot reach  your ankle or pant leg, loop a belt around your foot and grab the belt instead. Gently pull your heel toward your buttocks. Your knee should not slide out to the side. You should feel a stretch in the front of your thigh and knee (quadriceps). Hold this position for __________ seconds. Repeat __________ times. Complete this exercise __________ times a day. Hamstring, supine Lie on your back (supine position). Loop a belt or towel over the ball of your left / right foot. The ball of your foot is on the walking surface, right under your toes. Straighten your left / right knee and slowly pull on the belt to raise your leg until you feel a gentle stretch behind your knee (hamstring). Do not let your knee bend while you do this. Keep your other leg flat on the floor. Hold this position for __________ seconds. Repeat __________ times. Complete this exercise __________ times a day. Strengthening exercises These exercises build strength and endurance in your knee. Endurance is theability to use your muscles for a long time, even after they get tired. Quadriceps, isometric This exercise stretches the muscles in front of your thigh (quadriceps) without moving your knee joint (isometric). Lie on your back with your left / right leg extended and your other knee bent. Put a rolled towel or small pillow under your knee if told by your health care provider. Slowly tense the muscles in the front of your left / right thigh. You should see your kneecap slide up toward your hip or see increased dimpling just above the knee. This motion will   push the back of the knee toward the floor. For __________ seconds, hold the muscle as tight as you can without increasing your pain. Relax the muscles slowly and completely. Repeat __________ times. Complete this exercise __________ times a day. Straight leg raises This exercise stretches the muscles in front of your thigh (quadriceps) and the muscles that move your hips (hip  flexors). Lie on your back with your left / right leg extended and your other knee bent. Tense the muscles in the front of your left / right thigh. You should see your kneecap slide up or see increased dimpling just above the knee. Your thigh may even shake a bit. Keep these muscles tight as you raise your leg 4-6 inches (10-15 cm) off the floor. Do not let your knee bend. Hold this position for __________ seconds. Keep these muscles tense as you lower your leg. Relax your muscles slowly and completely after each repetition. Repeat __________ times. Complete this exercise __________ times a day. Hamstring, isometric Lie on your back on a firm surface. Bend your left / right knee about __________ degrees. Dig your left / right heel into the surface as if you are trying to pull it toward your buttocks. Tighten the muscles in the back of your thighs (hamstring) to "dig" as hard as you can without increasing any pain. Hold this position for __________ seconds. Release the tension gradually and allow your muscles to relax completely for __________ seconds after each repetition. Repeat __________ times. Complete this exercise __________ times a day. Hamstring curls If told by your health care provider, do this exercise while wearing ankle weights. Begin with __________ lb weights. Then increase the weight by 1 lb (0.5 kg) increments. Do not wear ankle weights that are more than __________ lb. Lie on your abdomen with your legs straight. Bend your left / right knee as far as you can without feeling pain. Keep your hips flat against the floor. Hold this position for __________ seconds. Slowly lower your leg to the starting position. Repeat __________ times. Complete this exercise __________ times a day. Squats This exercise strengthens the muscles in front of your thigh and knee (quadriceps). Stand in front of a table, with your feet and knees pointing straight ahead. You may rest your hands on the  table for balance but not for support. Slowly bend your knees and lower your hips like you are going to sit in a chair. Keep your weight over your heels, not over your toes. Keep your lower legs upright so they are parallel with the table legs. Do not let your hips go lower than your knees. Do not bend lower than told by your health care provider. If your knee pain increases, do not bend as low. Hold the squat position for __________ seconds. Slowly push with your legs to return to standing. Do not use your hands to pull yourself to standing. Repeat __________ times. Complete this exercise __________ times a day. Wall slides This exercise strengthens the muscles in front of your thigh and knee (quadriceps). Lean your back against a smooth wall or door, and walk your feet out 18-24 inches (46-61 cm) from it. Place your feet hip-width apart. Slowly slide down the wall or door until your knees bend __________ degrees. Keep your knees over your heels, not over your toes. Keep your knees in line with your hips. Hold this position for __________ seconds. Repeat __________ times. Complete this exercise __________ times a day. Straight leg raises This exercise   strengthens the muscles that rotate the leg at the hip and move it away from your body (hip abductors). Lie on your side with your left / right leg in the top position. Lie so your head, shoulder, knee, and hip line up. You may bend your bottom knee to help you keep your balance. Roll your hips slightly forward so your hips are stacked directly over each other and your left / right knee is facing forward. Leading with your heel, lift your top leg 4-6 inches (10-15 cm). You should feel the muscles in your outer hip lifting. Do not let your foot drift forward. Do not let your knee roll toward the ceiling. Hold this position for __________ seconds. Slowly return your leg to the starting position. Let your muscles relax completely after each  repetition. Repeat __________ times. Complete this exercise __________ times a day. Straight leg raises This exercise stretches the muscles that move your hips away from the front of the pelvis (hip extensors). Lie on your abdomen on a firm surface. You can put a pillow under your hips if that is more comfortable. Tense the muscles in your buttocks and lift your left / right leg about 4-6 inches (10-15 cm). Keep your knee straight as you lift your leg. Hold this position for __________ seconds. Slowly lower your leg to the starting position. Let your leg relax completely after each repetition. Repeat __________ times. Complete this exercise __________ times a day. This information is not intended to replace advice given to you by your health care provider. Make sure you discuss any questions you have with your healthcare provider. Document Revised: 07/08/2018 Document Reviewed: 07/08/2018 [employer] Patient Education  2022 [employer] Hand Exercises Hand exercises can be helpful for almost anyone. These exercises can strengthen the hands, improve flexibility and movement, and increase blood flow to the hands. These results can make work and daily tasks easier. Hand exercises can be especially helpful for people who have joint pain from arthritis or have nerve damage from overuse (carpal tunnel syndrome). These exercises can also help people who have injured a hand. Exercises Most of these hand exercises are gentle stretching and motion exercises. It is usually safe to do them often throughout the day. Warming up your hands before exercise may help to reduce stiffness. You can do this with gentle massage orby placing your hands in warm water for 10-15 minutes. It is normal to feel some stretching, pulling, tightness, or mild discomfort as you begin new exercises. This will gradually improve. Stop an exercise right away if you feel sudden, severe pain or your pain gets worse. Ask your healthcare  provider which exercises are best for you. Knuckle bend or "claw" fist Stand or sit with your arm, hand, and all five fingers pointed straight up. Make sure to keep your wrist straight during the exercise. Gently bend your fingers down toward your palm until the tips of your fingers are touching the top of your palm. Keep your big knuckle straight and just bend the small knuckles in your fingers. Hold this position for __________ seconds. Straighten (extend) your fingers back to the starting position. Repeat this exercise 5-10 times with each hand. Full finger fist Stand or sit with your arm, hand, and all five fingers pointed straight up. Make sure to keep your wrist straight during the exercise. Gently bend your fingers into your palm until the tips of your fingers are touching the middle of your palm. Hold this position for __________ seconds.   Extend your fingers back to the starting position, stretching every joint fully. Repeat this exercise 5-10 times with each hand. Straight fist Stand or sit with your arm, hand, and all five fingers pointed straight up. Make sure to keep your wrist straight during the exercise. Gently bend your fingers at the big knuckle, where your fingers meet your hand, and the middle knuckle. Keep the knuckle at the tips of your fingers straight and try to touch the bottom of your palm. Hold this position for __________ seconds. Extend your fingers back to the starting position, stretching every joint fully. Repeat this exercise 5-10 times with each hand. Tabletop Stand or sit with your arm, hand, and all five fingers pointed straight up. Make sure to keep your wrist straight during the exercise. Gently bend your fingers at the big knuckle, where your fingers meet your hand, as far down as you can while keeping the small knuckles in your fingers straight. Think of forming a tabletop with your fingers. Hold this position for __________ seconds. Extend your fingers  back to the starting position, stretching every joint fully. Repeat this exercise 5-10 times with each hand. Finger spread Place your hand flat on a table with your palm facing down. Make sure your wrist stays straight as you do this exercise. Spread your fingers and thumb apart from each other as far as you can until you feel a gentle stretch. Hold this position for __________ seconds. Bring your fingers and thumb tight together again. Hold this position for __________ seconds. Repeat this exercise 5-10 times with each hand. Making circles Stand or sit with your arm, hand, and all five fingers pointed straight up. Make sure to keep your wrist straight during the exercise. Make a circle by touching the tip of your thumb to the tip of your index finger. Hold for __________ seconds. Then open your hand wide. Repeat this motion with your thumb and each finger on your hand. Repeat this exercise 5-10 times with each hand. Thumb motion Sit with your forearm resting on a table and your wrist straight. Your thumb should be facing up toward the ceiling. Keep your fingers relaxed as you move your thumb. Lift your thumb up as high as you can toward the ceiling. Hold for __________ seconds. Bend your thumb across your palm as far as you can, reaching the tip of your thumb for the small finger (pinkie) side of your palm. Hold for __________ seconds. Repeat this exercise 5-10 times with each hand. Grip strengthening  Hold a stress ball or other soft ball in the middle of your hand. Slowly increase the pressure, squeezing the ball as much as you can without causing pain. Think of bringing the tips of your fingers into the middle of your palm. All of your finger joints should bend when doing this exercise. Hold your squeeze for __________ seconds, then relax. Repeat this exercise 5-10 times with each hand. Contact a health care provider if: Your hand pain or discomfort gets much worse when you do an  exercise. Your hand pain or discomfort does not improve within 2 hours after you exercise. If you have any of these problems, stop doing these exercises right away. Do not do them again unless your health care provider says that you can. Get help right away if: You develop sudden, severe hand pain or swelling. If this happens, stop doing these exercises right away. Do not do them again unless your health care provider says that you can. This   information is not intended to replace advice given to you by your health care provider. Make sure you discuss any questions you have with your healthcare provider. Document Revised: 01/08/2019 Document Reviewed: 09/18/2018 [employer] Patient Education  [place]. Back Exercises The following exercises strengthen the muscles that help to support the trunk and back. They also help to keep the lower back flexible. Doing these exercises can help to prevent back pain or lessen existing pain. If you have back pain or discomfort, try doing these exercises 2-3 times each day or as told by your health care provider. As your pain improves, do them once each day, but increase the number of times that you repeat the steps for each exercise (do more repetitions). To prevent the recurrence of back pain, continue to do these exercises once each day or as told by your health care provider. Do exercises exactly as told by your health care provider and adjust them as directed. It is normal to feel mild stretching, pulling, tightness, or discomfort as you do these exercises, but you should stop right away if youfeel sudden pain or your pain gets worse. Exercises Single knee to chest Repeat these steps 3-5 times for each leg: Lie on your back on a firm bed or the floor with your legs extended. Bring one knee to your chest. Your other leg should stay extended and in contact with the floor. Hold your knee in place by grabbing your knee or thigh with both hands and  hold. Pull on your knee until you feel a gentle stretch in your lower back or buttocks. Hold the stretch for 10-30 seconds. Slowly release and straighten your leg. Pelvic tilt Repeat these steps 5-10 times: Lie on your back on a firm bed or the floor with your legs extended. Bend your knees so they are pointing toward the ceiling and your feet are flat on the floor. Tighten your lower abdominal muscles to press your lower back against the floor. This motion will tilt your pelvis so your tailbone points up toward the ceiling instead of pointing to your feet or the floor. With gentle tension and even breathing, hold this position for 5-10 seconds. Cat-cow Repeat these steps until your lower back becomes more flexible: Get into a hands-and-knees position on a firm surface. Keep your hands under your shoulders, and keep your knees under your hips. You may place padding under your knees for comfort. Let your head hang down toward your chest. Contract your abdominal muscles and point your tailbone toward the floor so your lower back becomes rounded like the back of a cat. Hold this position for 5 seconds. Slowly lift your head, let your abdominal muscles relax and point your tailbone up toward the ceiling so your back forms a sagging arch like the back of a cow. Hold this position for 5 seconds.  Press-ups Repeat these steps 5-10 times: Lie on your abdomen (face-down) on the floor. Place your palms near your head, about shoulder-width apart. Keeping your back as relaxed as possible and keeping your hips on the floor, slowly straighten your arms to raise the top half of your body and lift your shoulders. Do not use your back muscles to raise your upper torso. You may adjust the placement of your hands to make yourself more comfortable. Hold this position for 5 seconds while you keep your back relaxed. Slowly return to lying flat on the floor.  Bridges Repeat these steps 10 times: Lie on your  back  on a firm surface. Bend your knees so they are pointing toward the ceiling and your feet are flat on the floor. Your arms should be flat at your sides, next to your body. Tighten your buttocks muscles and lift your buttocks off the floor until your waist is at almost the same height as your knees. You should feel the muscles working in your buttocks and the back of your thighs. If you do not feel these muscles, slide your feet 1-2 inches farther away from your buttocks. Hold this position for 3-5 seconds. Slowly lower your hips to the starting position, and allow your buttocks muscles to relax completely. If this exercise is too easy, try doing it with your arms crossed over yourchest. Abdominal crunches Repeat these steps 5-10 times: Lie on your back on a firm bed or the floor with your legs extended. Bend your knees so they are pointing toward the ceiling and your feet are flat on the floor. Cross your arms over your chest. Tip your chin slightly toward your chest without bending your neck. Tighten your abdominal muscles and slowly raise your trunk (torso) high enough to lift your shoulder blades a tiny bit off the floor. Avoid raising your torso higher than that because it can put too much stress on your low back and does not help to strengthen your abdominal muscles. Slowly return to your starting position. Back lifts Repeat these steps 5-10 times: Lie on your abdomen (face-down) with your arms at your sides, and rest your forehead on the floor. Tighten the muscles in your legs and your buttocks. Slowly lift your chest off the floor while you keep your hips pressed to the floor. Keep the back of your head in line with the curve in your back. Your eyes should be looking at the floor. Hold this position for 3-5 seconds. Slowly return to your starting position. Contact a health care provider if: Your back pain or discomfort gets much worse when you do an exercise. Your worsening back  pain or discomfort does not lessen within 2 hours after you exercise. If you have any of these problems, stop doing these exercises right away. Do not do them again unless your health care provider says that you can. Get help right away if: You develop sudden, severe back pain. If this happens, stop doing the exercises right away. Do not do them again unless your health care provider says that you can. This information is not intended to replace advice given to you by your health care provider. Make sure you discuss any questions you have with your healthcare provider. Document Revised: 01/22/2019 Document Reviewed: 06/19/2018 Elsevier Patient Education  [place].

## 2021-04-04 ENCOUNTER — Telehealth: Payer: Self-pay

## 2021-04-04 ENCOUNTER — Other Ambulatory Visit: Payer: Self-pay

## 2021-04-04 DIAGNOSIS — I10 Essential (primary) hypertension: Secondary | ICD-10-CM

## 2021-04-04 MED ORDER — AMLODIPINE BESYLATE 10 MG PO TABS: 10.0000 mg | ORAL_TABLET | Freq: Every day | ORAL | 5 refills | Status: DC

## 2021-04-04 NOTE — Telephone Encounter (Signed)
Pt needs amlodipine called in 90 days worth --[facility]

## 2021-04-04 NOTE — Telephone Encounter (Signed)
Rx  sent  to  [facility]

## 2021-04-05 ENCOUNTER — Ambulatory Visit (INDEPENDENT_AMBULATORY_CARE_PROVIDER_SITE_OTHER): Payer: Medicare Other

## 2021-04-05 ENCOUNTER — Other Ambulatory Visit: Payer: Self-pay

## 2021-04-05 DIAGNOSIS — Z1211 Encounter for screening for malignant neoplasm of colon: Secondary | ICD-10-CM

## 2021-04-05 DIAGNOSIS — Z0000 Encounter for general adult medical examination without abnormal findings: Secondary | ICD-10-CM

## 2021-04-05 NOTE — Patient Instructions (Signed)
Ms. Sevillano , Thank you for taking time to come for your Medicare Wellness Visit. I appreciate your ongoing commitment to your health goals. Please review the following plan we discussed and let me know if I can assist you in the future.   Screening recommendations/referrals: Colonoscopy: Currently due, orders placed this visit  Mammogram: Up to date, next due 12/07/2021 Bone Density: Up to date, next due 10/11/2024 Recommended yearly ophthalmology/optometry visit for glaucoma screening and checkup Recommended yearly dental visit for hygiene and checkup  Vaccinations: Influenza vaccine: Up to date, next due fall 2022  Pneumococcal vaccine: Completed series  Tdap vaccine: Up to date, next due 07/18/2023 Shingles vaccine: Completed series     Advanced directives: Please bring in copies of your advanced medical directives so that we may scan them into your chart.   Conditions/risks identified: none  Next appointment: none    Preventive Care 65 Years and Older, Female Preventive care refers to lifestyle choices and visits with your health care provider that can promote health and wellness. What does preventive care include? A yearly physical exam. This is also called an annual well check. Dental exams once or twice a year. Routine eye exams. Ask your health care provider how often you should have your eyes checked. Personal lifestyle choices, including: Daily care of your teeth and gums. Regular physical activity. Eating a healthy diet. Avoiding tobacco and drug use. Limiting alcohol use. Practicing safe sex. Taking low-dose aspirin every day. Taking vitamin and mineral supplements as recommended by your health care provider. What happens during an annual well check? The services and screenings done by your health care provider during your annual well check will depend on your age, overall health, lifestyle risk factors, and family history of disease. Counseling  Your health  care provider may ask you questions about your: Alcohol use. Tobacco use. Drug use. Emotional well-being. Home and relationship well-being. Sexual activity. Eating habits. History of falls. Memory and ability to understand (cognition). Work and work Statistician. Reproductive health. Screening  You may have the following tests or measurements: Height, weight, and BMI. Blood pressure. Lipid and cholesterol levels. These may be checked every 5 years, or more frequently if you are over 92 years old. Skin check. Lung cancer screening. You may have this screening every year starting at age 66 if you have a 30-pack-year history of smoking and currently smoke or have quit within the past 15 years. Fecal occult blood test (FOBT) of the stool. You may have this test every year starting at age 67. Flexible sigmoidoscopy or colonoscopy. You may have a sigmoidoscopy every 5 years or a colonoscopy every 10 years starting at age 66. Hepatitis C blood test. Hepatitis B blood test. Sexually transmitted disease (STD) testing. Diabetes screening. This is done by checking your blood sugar (glucose) after you have not eaten for a while (fasting). You may have this done every 1-3 years. Bone density scan. This is done to screen for osteoporosis. You may have this done starting at age 12. Mammogram. This may be done every 1-2 years. Talk to your health care provider about how often you should have regular mammograms. Talk with your health care provider about your test results, treatment options, and if necessary, the need for more tests. Vaccines  Your health care provider may recommend certain vaccines, such as: Influenza vaccine. This is recommended every year. Tetanus, diphtheria, and acellular pertussis (Tdap, Td) vaccine. You may need a Td booster every 10 years. Zoster vaccine. You may  need this after age 9. Pneumococcal 13-valent conjugate (PCV13) vaccine. One dose is recommended after age  20. Pneumococcal polysaccharide (PPSV23) vaccine. One dose is recommended after age 40. Talk to your health care provider about which screenings and vaccines you need and how often you need them. This information is not intended to replace advice given to you by your health care provider. Make sure you discuss any questions you have with your health care provider. Document Released: 10/14/2015 Document Revised: 06/06/2016 Document Reviewed: 07/19/2015 Elsevier Interactive Patient Education  2017 [place] Prevention in the Home Falls can cause injuries. They can happen to people of all ages. There are many things you can do to make your home safe and to help prevent falls. What can I do on the outside of my home? Regularly fix the edges of walkways and driveways and fix any cracks. Remove anything that might make you trip as you walk through a door, such as a raised step or threshold. Trim any bushes or trees on the path to your home. Use bright outdoor lighting. Clear any walking paths of anything that might make someone trip, such as rocks or tools. Regularly check to see if handrails are loose or broken. Make sure that both sides of any steps have handrails. Any raised decks and porches should have guardrails on the edges. Have any leaves, snow, or ice cleared regularly. Use sand or salt on walking paths during winter. Clean up any spills in your garage right away. This includes oil or grease spills. What can I do in the bathroom? Use night lights. Install grab bars by the toilet and in the tub and shower. Do not use towel bars as grab bars. Use non-skid mats or decals in the tub or shower. If you need to sit down in the shower, use a plastic, non-slip stool. Keep the floor dry. Clean up any water that spills on the floor as soon as it happens. Remove soap buildup in the tub or shower regularly. Attach bath mats securely with double-sided non-slip rug tape. Do not have throw  rugs and other things on the floor that can make you trip. What can I do in the bedroom? Use night lights. Make sure that you have a light by your bed that is easy to reach. Do not use any sheets or blankets that are too big for your bed. They should not hang down onto the floor. Have a firm chair that has side arms. You can use this for support while you get dressed. Do not have throw rugs and other things on the floor that can make you trip. What can I do in the kitchen? Clean up any spills right away. Avoid walking on wet floors. Keep items that you use a lot in easy-to-reach places. If you need to reach something above you, use a strong step stool that has a grab bar. Keep electrical cords out of the way. Do not use floor polish or wax that makes floors slippery. If you must use wax, use non-skid floor wax. Do not have throw rugs and other things on the floor that can make you trip. What can I do with my stairs? Do not leave any items on the stairs. Make sure that there are handrails on both sides of the stairs and use them. Fix handrails that are broken or loose. Make sure that handrails are as long as the stairways. Check any carpeting to make sure that it is firmly attached  to the stairs. Fix any carpet that is loose or worn. Avoid having throw rugs at the top or bottom of the stairs. If you do have throw rugs, attach them to the floor with carpet tape. Make sure that you have a light switch at the top of the stairs and the bottom of the stairs. If you do not have them, ask someone to add them for you. What else can I do to help prevent falls? Wear shoes that: Do not have high heels. Have rubber bottoms. Are comfortable and fit you well. Are closed at the toe. Do not wear sandals. If you use a stepladder: Make sure that it is fully opened. Do not climb a closed stepladder. Make sure that both sides of the stepladder are locked into place. Ask someone to hold it for you, if  possible. Clearly mark and make sure that you can see: Any grab bars or handrails. First and last steps. Where the edge of each step is. Use tools that help you move around (mobility aids) if they are needed. These include: Canes. Walkers. Scooters. Crutches. Turn on the lights when you go into a dark area. Replace any light bulbs as soon as they burn out. Set up your furniture so you have a clear path. Avoid moving your furniture around. If any of your floors are uneven, fix them. If there are any pets around you, be aware of where they are. Review your medicines with your doctor. Some medicines can make you feel dizzy. This can increase your chance of falling. Ask your doctor what other things that you can do to help prevent falls. This information is not intended to replace advice given to you by your health care provider. Make sure you discuss any questions you have with your health care provider. Document Released: 07/14/2009 Document Revised: 02/23/2016 Document Reviewed: 10/22/2014 Elsevier Interactive Patient Education  2017 [employer].

## 2021-04-05 NOTE — Progress Notes (Signed)
Subjective:   Tanya [name] is a 70 y.o. female who presents for an Initial Medicare Annual Wellness Visit.  Review of Systems    N/A  Cardiac Risk Factors include: advanced age (>35men, >32 women);hypertension;dyslipidemia     Objective:    Today's Vitals   04/05/21 0943 04/05/21 0947  BP: (!) 144/77   Pulse: 61   Temp: 98 F (36.7 C)   TempSrc: Oral   SpO2: 98%   Weight: 132 lb 0.4 oz (59.9 kg)   Height: 5\' 4"  (1.626 m)   PainSc:  4    Body mass index is 22.66 kg/m.  Advanced Directives 04/05/2021 01/18/2021 03/28/2020 12/21/2019 11/23/2019 09/01/2019 06/29/2019  Does Patient Have a Medical Advance Directive? Yes No No No No Yes Yes  Type of Paramedic of [place];Living will - - - - Press photographer;Living will Living will  Does patient want to make changes to medical advance directive? No - Patient declined - - - - No - Patient declined No - Patient declined  Copy of [place] in Chart? No - copy requested - - - - No - copy requested -  Would patient like information on creating a medical advance directive? - No - Patient declined No - Patient declined - No - Patient declined - -    Current Medications (verified) Outpatient Encounter Medications as of 04/05/2021  Medication Sig   acetaminophen (TYLENOL) 500 MG tablet Take 1,000 mg by mouth every 6 (six) hours as needed for moderate pain.   amLODipine (NORVASC) 10 MG tablet Take 1 tablet (10 mg total) by mouth daily.   Ascorbic Acid (VITAMIN C) 1000 MG tablet Take 1,000 mg by mouth daily.   aspirin EC 81 MG tablet Take 81 mg by mouth daily. Swallow whole.   cholecalciferol (VITAMIN D3) 25 MCG (1000 UNIT) tablet Take 1,000 Units by mouth daily.   clopidogrel (PLAVIX) 75 MG tablet Take 1 tablet (75 mg total) by mouth daily.   metoprolol succinate (TOPROL-XL) 25 MG 24 hr tablet Take 0.5 tablets (12.5 mg total) by mouth daily.   omeprazole (PRILOSEC) 40 MG capsule Take 1  capsule (40 mg total) by mouth daily.   PARoxetine (PAXIL) 10 MG tablet Take 1 tablet (10 mg total) by mouth daily.   potassium chloride SA (KLOR-CON) 20 MEQ tablet Take 1 tablet (20 mEq total) by mouth daily.   prednisoLONE acetate (PRED FORTE) 1 % ophthalmic suspension prednisolone acetate 1 % eye drops,suspension  INSTILL 1 DROP INTO RIGHT EYE EVERY 8 HOURS SHAKE WELL   rosuvastatin (CRESTOR) 40 MG tablet TAKE 1 TABLET BY MOUTH  DAILY   Simethicone 80 MG TABS Take 1 tablet (80 mg total) by mouth 2 (two) times daily as needed.   temazepam (RESTORIL) 15 MG capsule TAKE 1 CAPSULE BY MOUTH AT BEDTIME AS NEEDED FOR SLEEP   nitroGLYCERIN (NITROSTAT) 0.4 MG SL tablet Place 1 tablet (0.4 mg total) under the tongue every 5 (five) minutes as needed. (Patient not taking: Reported on 04/05/2021)   No facility-administered encounter medications on file as of 04/05/2021.    Allergies (verified) Patient has no known allergies.   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 s/p DESx2 to proximal LAD in 06/2018 b. cath in 04/2019 showing 40% mid-LAD ISR with no significant stenosis  along LM, LCx, or RCA   Cystitis    Elevated liver enzymes    Fatty liver    GERD (gastroesophageal reflux disease)    Headache    migraine   Hyperlipidemia    Hypertension    Insomnia    Osteoarthritis    Unspecified hypothyroidism    Vaginitis    Wears glasses    Wears hearing aid    B/L   Past Surgical History:  Procedure Laterality Date   BIOPSY N/A 01/18/2021   Procedure: BIOPSY;  Surgeon: Harvel Quale, MD;  [facility];  Service: Gastroenterology;  Laterality: N/A;   CATARACT EXTRACTION W/PHACO Right 05/08/2019   Procedure: CATARACT EXTRACTION PHACO AND INTRAOCULAR LENS PLACEMENT (IOC);  Surgeon: Baruch Goldmann, MD;  Location: [facility];  Service: Ophthalmology;  Laterality: Right;  CDE:  10.02   COLONOSCOPY N/A 02/08/2016   Procedure: COLONOSCOPY;  Surgeon: [name], MD;  [facility];  Service: Endoscopy;  Laterality: N/A;  1200 - moved to 11:45 - Ann to notify   CORONARY STENT INTERVENTION N/A 06/06/2018   Procedure: CORONARY STENT INTERVENTION;  Surgeon: [name], [name], MD;  Location: [place] [facility];  Service: Cardiovascular;  Laterality: N/A;   ESOPHAGOGASTRODUODENOSCOPY (EGD) WITH PROPOFOL N/A 01/18/2021   Procedure: ESOPHAGOGASTRODUODENOSCOPY (EGD) WITH PROPOFOL;  Surgeon: Harvel Quale, MD;  [facility];  Service: Gastroenterology;  Laterality: N/A;  10:30AM   INTRAVASCULAR PRESSURE WIRE/FFR STUDY N/A 06/06/2018   Procedure: INTRAVASCULAR PRESSURE WIRE/FFR STUDY;  Surgeon: [name], [name], MD;  Location: [place] [facility];  Service: Cardiovascular;  Laterality: N/A;   IR ANGIO INTRA EXTRACRAN SEL COM CAROTID INNOMINATE BILAT MOD SED  09/01/2019   IR ANGIO INTRA EXTRACRAN SEL INTERNAL CAROTID UNI L MOD SED  12/21/2019   IR ANGIO VERTEBRAL SEL VERTEBRAL BILAT MOD SED  09/01/2019   IR ANGIOGRAM FOLLOW UP STUDY  12/21/2019   IR TRANSCATH/EMBOLIZ  12/21/2019   LEFT HEART CATH AND CORONARY ANGIOGRAPHY N/A 04/24/2019   Procedure: LEFT HEART CATH AND CORONARY ANGIOGRAPHY;  Surgeon: Belva Crome, MD;  Location: [facility];  Service: Cardiovascular;  Laterality: N/A;   LEFT HEART CATHETERIZATION WITH CORONARY ANGIOGRAM N/A 11/19/2014   Procedure: LEFT HEART CATHETERIZATION WITH CORONARY ANGIOGRAM;  Surgeon: [name], MD;  Location: [facility];  Service: Cardiovascular;  Laterality: N/A;   RADIOLOGY WITH ANESTHESIA N/A 11/23/2019   Procedure: Drucilla Schmidt;  Surgeon: Luanne Bras, MD;  Location: [facility];  Service: Radiology;  Laterality: N/A;   RADIOLOGY WITH ANESTHESIA N/A 12/21/2019   Procedure: EMBOLIZATION;  Surgeon: Luanne Bras, MD;  Location: [facility];  Service: Radiology;  Laterality: N/A;   RIGHT/LEFT HEART CATH AND  CORONARY ANGIOGRAPHY N/A 06/06/2018   Procedure: RIGHT/LEFT HEART CATH AND CORONARY ANGIOGRAPHY;  Surgeon: [name], [name], MD;  Location: [place] [facility];  Service: Cardiovascular;  Laterality: N/A;   Family History  Problem Relation Age of Onset   Dementia Mother    Heart disease Mother        heart attack    Hypertension Mother    Heart attack Father    Heart disease Father        heart attack    Hypertension Father    Social History   Socioeconomic History   Marital status: Married    Spouse name: Not on file   Number of children: Not on file   Years of education: Not on file   Highest  education level: Not on file  Occupational History   Not on file  Tobacco Use   Smoking status: Never   Smokeless tobacco: Never  Vaping Use   Vaping Use: Never used  Substance and Sexual Activity   Alcohol use: No    Alcohol/week: 0.0 standard drinks   Drug use: No   Sexual activity: Not on file  Other Topics Concern   Not on file  Social History Narrative   Pt originally from the [place] been in [place] for 20 years     Social Determinants of Radio broadcast assistant Strain: Low Risk    Difficulty of Paying Living Expenses: Not hard at all  Food Insecurity: No Food Insecurity   Worried About Charity fundraiser in the Last Year: Never true   Arboriculturist in the Last Year: Never true  Transportation Needs: No Transportation Needs   Lack of Transportation (Medical): No   Lack of Transportation (Non-Medical): No  Physical Activity: Sufficiently Active   Days of Exercise per Week: 7 days   Minutes of Exercise per Session: 30 min  Stress: No Stress Concern Present   Feeling of Stress : Not at all  Social Connections: Moderately Isolated   Frequency of Communication with Friends and Family: Three times a week   Frequency of Social Gatherings with Friends and Family: Once a week   Attends Religious Services: Never   Marine scientist or Organizations: No   Attends  Music therapist: Never   Marital Status: Married    Tobacco Counseling Counseling given: Not Answered   Clinical Intake:  Pre-visit preparation completed: Yes  Pain : 0-10 Pain Score: 4  Pain Type: Acute pain Pain Location: Leg Pain Orientation: Lower, Left Pain Onset: More than a month ago Pain Frequency: Intermittent     Nutritional Risks: None Diabetes: No  How often do you need to have someone help you when you read instructions, pamphlets, or other written materials from your doctor or pharmacy?: 1 - Never  Diabetic?No   Interpreter Needed?: No  Information entered by :: [place] of Daily Living In your present state of health, do you have any difficulty performing the following activities: 04/05/2021 01/26/2021  Hearing? Y N  Vision? N N  Difficulty concentrating or making decisions? Y N  Walking or climbing stairs? N N  Dressing or bathing? N N  Doing errands, shopping? N N  Preparing Food and eating ? N -  Using the Toilet? N -  In the past six months, have you accidently leaked urine? Y -  Do you have problems with loss of bowel control? N -  Managing your Medications? N -  Managing your Finances? N -  Housekeeping or managing your Housekeeping? N -  Some recent data might be hidden    Patient Care Team: [name], MD as PCP - General (Family Medicine) [name] Alphonse Guild, MD as PCP - Cardiology (Cardiology)  Indicate any recent Medical Services you may have received from other than [facility] providers in the past year (date may be approximate).     Assessment:   This is a routine wellness examination for Tanya [name].  Hearing/Vision screen Vision Screening - Comments:: Patient states gets eye exams every 3 months. Has hx of cataract surgery to right eye. Awaiting surgery to left eye   Dietary issues and exercise activities discussed: Current Exercise Habits: Home exercise routine, Type of exercise: walking, Time  (Minutes):  30, Frequency (Times/Week): 7, Weekly Exercise (Minutes/Week): 210, Intensity: Moderate, Exercise limited by: None identified   Goals Addressed             This Visit's Progress    Prevent falls         Depression Screen PHQ 2/9 Scores 04/05/2021 01/26/2021 01/02/2021 10/03/2020 03/28/2020 08/04/2019 07/21/2019  PHQ - 2 Score 0 0 0 1 0 0 0  PHQ- 9 Score - - - - - - -    Fall Risk Fall Risk  04/05/2021 02/28/2021 01/26/2021 01/02/2021 10/03/2020  Falls in the past year? 0 0 0 0 0  Number falls in past yr: 0 0 0 0 0  Injury with Fall? 0 0 0 0 0  Risk for fall due to : No Fall Risks - No Fall Risks - No Fall Risks  Follow up Falls prevention discussed;Falls evaluation completed - Falls evaluation completed - Falls evaluation completed    FALL RISK PREVENTION PERTAINING TO THE HOME:  Any stairs in or around the home? Yes  If so, are there any without handrails? No  Home free of loose throw rugs in walkways, pet beds, electrical cords, etc? Yes  Adequate lighting in your home to reduce risk of falls? Yes   ASSISTIVE DEVICES UTILIZED TO PREVENT FALLS:  Life alert? No  Use of a cane, walker or w/c? No  Grab bars in the bathroom? Yes  Shower chair or bench in shower? Yes  Elevated toilet seat or a handicapped toilet? No   TIMED UP AND GO:  Was the test performed? Yes .  Length of time to ambulate 10 feet: 3 sec.   Gait steady and fast without use of assistive device  Cognitive Function:     6CIT Screen 04/05/2021  What Year? 0 points  What month? 0 points  What time? 3 points  Count back from 20 0 points  Months in reverse 4 points  Repeat phrase 4 points  Total Score 11    Immunizations Immunization History  Administered Date(s) Administered   Fluad Quad(high Dose 65+) 06/16/2019   Influenza Whole 07/05/2008   Influenza,inj,Quad PF,6+ Mos 07/17/2013, 08/01/2014, 05/24/2016, 05/19/2020   Influenza-Unspecified 06/10/2017   Moderna Sars-Covid-2 Vaccination  12/07/2019, 01/05/2020, 09/28/2020   Pneumococcal Conjugate-13 11/08/2014   Pneumococcal Polysaccharide-23 09/06/2004, 03/30/2016   Td 01/21/2003   Tdap 07/17/2013   Zoster Recombinat (Shingrix) 03/25/2019   Zoster, Live 01/12/2011    TDAP status: Up to date  Flu Vaccine status: Up to date  Pneumococcal vaccine status: Up to date  Covid-19 vaccine status: Completed vaccines  Qualifies for Shingles Vaccine? Yes   Zostavax completed Yes   Shingrix Completed?: Yes  Screening Tests Health Maintenance  Topic Date Due   Zoster Vaccines- Shingrix (2 of 2) 05/20/2019   COVID-19 Vaccine (4 - Booster for Moderna series) 01/27/2021   COLONOSCOPY (Pts 45-55yrs Insurance coverage will need to be confirmed)  02/07/2021   INFLUENZA VACCINE  05/01/2021   MAMMOGRAM  12/08/2022   TETANUS/TDAP  07/18/2023   DEXA SCAN  Completed   Hepatitis C Screening  Completed   PNA vac Low Risk Adult  Completed   HPV VACCINES  Aged Out    Health Maintenance  Health Maintenance Due  Topic Date Due   Zoster Vaccines- Shingrix (2 of 2) 05/20/2019   COVID-19 Vaccine (4 - Booster for Moderna series) 01/27/2021   COLONOSCOPY (Pts 45-109yrs Insurance coverage will need to be confirmed)  02/07/2021    Colorectal cancer screening:  Referral to GI placed 04/05/2021. Pt aware the office will call re: appt.  Mammogram status: Completed 12/07/2020. Repeat every year  Bone Density status: Completed 10/12/2019. Results reflect: Bone density results: OSTEOPENIA. Repeat every 5 years.  Lung Cancer Screening: (Low Dose CT Chest recommended if Age 6-80 years, 30 pack-year currently smoking OR have quit w/in 15years.) does not qualify.   Lung Cancer Screening Referral: N/A   Additional Screening:  Hepatitis C Screening: does qualify; Completed 05/06/2015  Vision Screening: Recommended annual ophthalmology exams for early detection of glaucoma and other disorders of the eye. Is the patient up to date with their  annual eye exam?  Yes  Who is the provider or what is the name of the office in which the patient attends annual eye exams? Unsure of eye doctor name If pt is not established with a provider, would they like to be referred to a provider to establish care? No .   Dental Screening: Recommended annual dental exams for proper oral hygiene  Community Resource Referral / Chronic Care Management: CRR required this visit?  No   CCM required this visit?  No      Plan:     I have personally reviewed and noted the following in the patient's chart:   Medical and social history Use of alcohol, tobacco or illicit drugs  Current medications and supplements including opioid prescriptions. Patient is not currently taking opioid prescriptions. Functional ability and status Nutritional status Physical activity Advanced directives List of other physicians Hospitalizations, surgeries, and ER visits in previous 12 months Vitals Screenings to include cognitive, depression, and falls Referrals and appointments  In addition, I have reviewed and discussed with patient certain preventive protocols, quality metrics, and best practice recommendations. A written personalized care plan for preventive services as well as general preventive health recommendations were provided to patient.     Ofilia Neas, LPN   10/01/5724   Nurse Notes: Please see 6CIT. Patient has some cognitive deficits and concerns of memory loss. Also had c/o of abnormal leg swelling and abdominal pain. Patient to be scheduled with PCP

## 2021-04-11 ENCOUNTER — Other Ambulatory Visit: Payer: Self-pay

## 2021-04-11 ENCOUNTER — Ambulatory Visit (INDEPENDENT_AMBULATORY_CARE_PROVIDER_SITE_OTHER): Payer: Medicare Other | Admitting: Family Medicine

## 2021-04-11 ENCOUNTER — Encounter: Payer: Self-pay | Admitting: Family Medicine

## 2021-04-11 VITALS — BP 134/69 | HR 68 | Temp 97.8°F | Resp 20 | Ht 64.0 in | Wt 133.0 lb

## 2021-04-11 DIAGNOSIS — I10 Essential (primary) hypertension: Secondary | ICD-10-CM

## 2021-04-11 DIAGNOSIS — R7302 Impaired glucose tolerance (oral): Secondary | ICD-10-CM

## 2021-04-11 DIAGNOSIS — F418 Other specified anxiety disorders: Secondary | ICD-10-CM

## 2021-04-11 DIAGNOSIS — E7849 Other hyperlipidemia: Secondary | ICD-10-CM | POA: Diagnosis not present

## 2021-04-11 DIAGNOSIS — M7989 Other specified soft tissue disorders: Secondary | ICD-10-CM | POA: Diagnosis not present

## 2021-04-11 NOTE — Assessment & Plan Note (Addendum)
2 week history, reports noting this after left knee intrarticular injection, also palpable nodule on medical aspct of leg, max diameter 1.5 in, refer to vascular,

## 2021-04-11 NOTE — Patient Instructions (Addendum)
F/U end August to re evaluate chronic problems  MMSE at that visit  Urine is good  You are referred to vascular specialist re leg swelling and knot  You are referred to Neurologist re headache and memeory ; los

## 2021-04-13 ENCOUNTER — Encounter: Payer: Self-pay | Admitting: Family Medicine

## 2021-04-13 DIAGNOSIS — F418 Other specified anxiety disorders: Secondary | ICD-10-CM | POA: Insufficient documentation

## 2021-04-13 NOTE — Assessment & Plan Note (Signed)
Patient educated about the importance of limiting  Carbohydrate intake , the need to commit to daily physical activity for a minimum of 30 minutes , and to commit weight loss. The fact that changes in all these areas will reduce or eliminate all together the development of diabetes is stressed.   Diabetic Labs Latest Ref Rng & Units 02/15/2021 01/04/2021 03/28/2020 01/27/2020 12/22/2019  HbA1c 4.8 - 5.6 % - - - - -  Chol 100 - 199 mg/dL - 130 - 120 -  HDL >39 mg/dL - 67 - 60 -  Calc LDL 0 - 99 mg/dL - 50 - 44 -  Triglycerides 0 - 149 mg/dL - 63 - 80 84  Creatinine 0.44 - 1.00 mg/dL 0.60 0.75 0.57 - -   BP/Weight 04/11/2021 04/05/2021 03/30/2021 02/28/2021 02/14/2021 01/26/2021 6/73/4193  Systolic BP 790 240 973 532 992 426 834  Diastolic BP 69 77 70 74 76 80 57  Wt. (Lbs) 133 132.03 134.8 134.12 133 136.12 136.91  BMI 22.83 22.66 23.14 23.02 22.83 23.36 23.5   No flowsheet data found.

## 2021-04-13 NOTE — Assessment & Plan Note (Signed)
Multiple somatic complaints, feels she is "falling apart", current paxil dose is 10 mg , will address more in the future and may increase dose  To 15 or 20 mg

## 2021-04-13 NOTE — Assessment & Plan Note (Signed)
Controlled, no change in medication DASH diet and commitment to daily physical activity for a minimum of 30 minutes discussed and encouraged, as a part of hypertension management. The importance of attaining a healthy weight is also discussed.  BP/Weight 04/11/2021 04/05/2021 03/30/2021 02/28/2021 02/14/2021 01/26/2021 2/58/5277  Systolic BP 824 235 361 443 154 008 676  Diastolic BP 69 77 70 74 76 80 57  Wt. (Lbs) 133 132.03 134.8 134.12 133 136.12 136.91  BMI 22.83 22.66 23.14 23.02 22.83 23.36 23.5

## 2021-04-13 NOTE — Progress Notes (Signed)
Tanya [name]     MRN: [id number]      DOB: 03-May-1951   HPI Tanya [name] is here with  c/o unilateral leg swelling x 2 weeks, following knee injection and c/o swelling/ lump on the affected leg. No cough, dyspnea, SOb or pai C/o abdominal pain, no change in VM , no N/V, no urinary symptoms. Feels likely related to stress and anxiety  C/o memory loss feels as though falling apart, repeatedly asks how is my health?  ROS Denies recent fever or chills. Denies sinus pressure, nasal congestion, ear pain or sore throat. Denies chest congestion, productive cough or wheezing. Denies chest pains, palpitations  Denies joint pain, swelling and limitation in mobility. Denies headaches, seizures, numbness, or tingling.  Denies skin break down or rash.   PE  BP 134/69 (BP Location: Right Arm, Patient Position: Sitting, Cuff Size: Large)   Pulse 68   Temp 97.8 F (36.6 C)   Resp 20   Ht 5\' 4"  (1.626 m)   Wt 133 lb (60.3 kg)   SpO2 98%   BMI 22.83 kg/m   Patient alert and oriented and in no cardiopulmonary distress.  HEENT: No facial asymmetry, EOMI,     Neck supple .  Chest: Clear to auscultation bilaterally.  CVS: S1, S2 no murmurs, no S3.Regular rate.  ABD: Soft non tender.   Ext: No edema  MS: Adequate ROM spine, shoulders, hips and knees.  Skin: Intact, no ulcerations or rash noted.  Psych: Good eye contact, normal affect. Memory intact not anxious or depressed appearing.  CNS: CN 2-12 intact, power,  normal throughout.no focal deficits noted.   Assessment & Plan  Left leg swelling 2 week history, reports noting this after left knee intrarticular injection, also palpable nodule on medical aspct of leg, max diameter 1.5 in, refer to vascular,   Anxiety about health Multiple somatic complaints, feels she is "falling apart", current paxil dose is 10 mg , will address more in the future and may increase dose  To 15 or 20 mg   Essential hypertension Controlled, no  change in medication DASH diet and commitment to daily physical activity for a minimum of 30 minutes discussed and encouraged, as a part of hypertension management. The importance of attaining a healthy weight is also discussed.  BP/Weight 04/11/2021 04/05/2021 03/30/2021 02/28/2021 02/14/2021 01/26/2021 9/56/3875  Systolic BP 643 329 518 841 660 630 160  Diastolic BP 69 77 70 74 76 80 57  Wt. (Lbs) 133 132.03 134.8 134.12 133 136.12 136.91  BMI 22.83 22.66 23.14 23.02 22.83 23.36 23.5       Hyperlipemia Hyperlipidemia:Low fat diet discussed and encouraged.  Currently at goal, continue meds   Lipid Panel  Lab Results  Component Value Date   CHOL 130 01/04/2021   HDL 67 01/04/2021   LDLCALC 50 01/04/2021   TRIG 63 01/04/2021   CHOLHDL 1.9 01/04/2021   Controlled, no change in medication     IGT (impaired glucose tolerance) Patient educated about the importance of limiting  Carbohydrate intake , the need to commit to daily physical activity for a minimum of 30 minutes , and to commit weight loss. The fact that changes in all these areas will reduce or eliminate all together the development of diabetes is stressed.   Diabetic Labs Latest Ref Rng & Units 02/15/2021 01/04/2021 03/28/2020 01/27/2020 12/22/2019  HbA1c 4.8 - 5.6 % - - - - -  Chol 100 - 199 mg/dL - 130 - 120 -  HDL >39 mg/dL - 67 - 60 -  Calc LDL 0 - 99 mg/dL - 50 - 44 -  Triglycerides 0 - 149 mg/dL - 63 - 80 84  Creatinine 0.44 - 1.00 mg/dL 0.60 0.75 0.57 - -   BP/Weight 04/11/2021 04/05/2021 03/30/2021 02/28/2021 02/14/2021 01/26/2021 2/57/4935  Systolic BP 521 747 159 539 672 897 915  Diastolic BP 69 77 70 74 76 80 57  Wt. (Lbs) 133 132.03 134.8 134.12 133 136.12 136.91  BMI 22.83 22.66 23.14 23.02 22.83 23.36 23.5   No flowsheet data found.

## 2021-04-13 NOTE — Assessment & Plan Note (Addendum)
Hyperlipidemia:Low fat diet discussed and encouraged.  Currently at goal, continue meds   Lipid Panel  Lab Results  Component Value Date   CHOL 130 01/04/2021   HDL 67 01/04/2021   LDLCALC 50 01/04/2021   TRIG 63 01/04/2021   CHOLHDL 1.9 01/04/2021   Controlled, no change in medication

## 2021-04-17 ENCOUNTER — Other Ambulatory Visit: Payer: Self-pay

## 2021-04-17 ENCOUNTER — Telehealth: Payer: Self-pay

## 2021-04-17 MED ORDER — PAROXETINE HCL 10 MG PO TABS: 10.0000 mg | ORAL_TABLET | Freq: Every day | ORAL | 1 refills | Status: DC

## 2021-04-17 NOTE — Telephone Encounter (Signed)
Med sent.

## 2021-04-17 NOTE — Telephone Encounter (Signed)
Patient need med refill Paroxetine 10 mg  Asking for 90 day supply  Pharmacy: [facility]

## 2021-04-19 ENCOUNTER — Ambulatory Visit (INDEPENDENT_AMBULATORY_CARE_PROVIDER_SITE_OTHER): Payer: Medicare Other | Admitting: Diagnostic Neuroimaging

## 2021-04-19 ENCOUNTER — Telehealth: Payer: Self-pay | Admitting: Diagnostic Neuroimaging

## 2021-04-19 ENCOUNTER — Other Ambulatory Visit: Payer: Self-pay

## 2021-04-19 ENCOUNTER — Encounter: Payer: Self-pay | Admitting: Diagnostic Neuroimaging

## 2021-04-19 VITALS — BP 164/75 | HR 55 | Ht 64.0 in | Wt 131.0 lb

## 2021-04-19 DIAGNOSIS — G43009 Migraine without aura, not intractable, without status migrainosus: Secondary | ICD-10-CM | POA: Diagnosis not present

## 2021-04-19 DIAGNOSIS — R413 Other amnesia: Secondary | ICD-10-CM

## 2021-04-19 MED ORDER — AIMOVIG 70 MG/ML ~~LOC~~ SOAJ: 70.0000 mg | SUBCUTANEOUS | 4 refills | Status: DC

## 2021-04-19 MED ORDER — NURTEC 75 MG PO TBDP: 75.0000 mg | ORAL_TABLET | Freq: Every day | ORAL | 6 refills | Status: DC | PRN

## 2021-04-19 NOTE — Progress Notes (Signed)
[facility]  PATIENT: Tanya [name] DOB: 1950/12/13  REFERRING CLINICIAN: Lindell Spar, MD HISTORY FROM: patient  REASON FOR VISIT: new consult    HISTORICAL  CHIEF COMPLAINT:  Chief Complaint  Patient presents with   Resistant headache, h/o cerebral aneurysm    Rm 6 New Pt  husband- Richard  "topiramate not good for my eyes in instructions, if I don't take it I have headache every day, throwing up; I am very forgetful"  MMSE  15    HISTORY OF PRESENT ILLNESS:   70 year old female here for evaluation of headaches.  Patient has had headaches since age 34 years old with global severe throbbing headaches, nausea vomiting, sensitive to light and sound.  No visual scotoma or aura.  Headaches were intermittent throughout her life.  Headaches have worsened in the last 1 to 2 years.  Patient now on topiramate which has slightly helped.  September 2020 patient had ER evaluation for chest pain, headache, numbness.  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  She was referred to neurologist Dr. Merlene Laughter.  He referred patient to Dr. Estanislado Pandy for aneurysm evaluation.  Although aneurysm was 3 mm in size and not felt to be symptomatic, this was treated with endovascular embolization.  Patient has been on topiramate for past 1 year for migraine treatment.  This is slightly helped symptoms but patient continues to have almost daily headaches.  Patient feeling more headaches on the right side.  Also complaining of memory loss issues.  Patient is very hard of hearing and does not use her hearing aids.  She maintains all her ADLs.  She still works in a [employer] certified [employer] and enjoys this work.  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  Patient has been having issues with anxiety and depression on medication.  Has also had problems with insomnia in the past.   REVIEW OF SYSTEMS: Full 14 system  review of systems performed and negative with exception of: as per HPI.  ALLERGIES: No Known Allergies  HOME MEDICATIONS: Outpatient Medications Prior to Visit  Medication Sig Dispense Refill   acetaminophen (TYLENOL) 500 MG tablet Take 1,000 mg by mouth every 6 (six) hours as needed for moderate pain.     amLODipine (NORVASC) 10 MG tablet Take 1 tablet (10 mg total) by mouth daily. 30 tablet 5   Ascorbic Acid (VITAMIN C) 1000 MG tablet Take 1,000 mg by mouth daily.     aspirin EC 81 MG tablet Take 81 mg by mouth daily. Swallow whole.     cholecalciferol (VITAMIN D3) 25 MCG (1000 UNIT) tablet Take 1,000 Units by mouth daily.     cloNIDine (CATAPRES) 0.2 MG tablet 0.2 mg at bedtime.     clopidogrel (PLAVIX) 75 MG tablet Take 1 tablet (75 mg total) by mouth daily. 90 tablet 3   metoprolol succinate (TOPROL-XL) 25 MG 24 hr tablet Take 0.5 tablets (12.5 mg total) by mouth daily. 45 tablet 3   nitroGLYCERIN (NITROSTAT) 0.4 MG SL tablet Place 1 tablet (0.4 mg total) under the tongue every 5 (five) minutes as needed. 25 tablet 3   omeprazole (PRILOSEC) 40 MG capsule Take 1 capsule (40 mg total) by mouth daily. 90 capsule 3   PARoxetine (PAXIL) 10 MG tablet Take 1 tablet (10 mg total) by mouth daily. 90 tablet 1   potassium chloride SA (KLOR-CON) 20 MEQ tablet Take  1 tablet (20 mEq total) by mouth daily. 30 tablet 5   prednisoLONE acetate (PRED FORTE) 1 % ophthalmic suspension prednisolone acetate 1 % eye drops,suspension  INSTILL 1 DROP INTO RIGHT EYE EVERY 8 HOURS SHAKE WELL     rosuvastatin (CRESTOR) 40 MG tablet TAKE 1 TABLET BY MOUTH  DAILY 90 tablet 3   Simethicone 80 MG TABS Take 1 tablet (80 mg total) by mouth 2 (two) times daily as needed. 60 tablet 1   temazepam (RESTORIL) 15 MG capsule TAKE 1 CAPSULE BY MOUTH AT BEDTIME AS NEEDED FOR SLEEP 30 capsule 5   topiramate (TOPAMAX) 25 MG capsule Take 50 mg by mouth daily.     No facility-administered medications prior to visit.    PAST  MEDICAL HISTORY: Past Medical History:  Diagnosis Date   Aneurysm ([place])    left ICA 09/2019   Anhedonia    Anxiety    Brain aneurysm    CAD (coronary artery disease)    a. s/p DESx2 to proximal LAD in 06/2018 b. cath in 04/2019 showing 40% mid-LAD ISR with no significant stenosis along LM, LCx, or RCA   Cystitis    Depression    Elevated liver enzymes    Fatty liver    GERD (gastroesophageal reflux disease)    Hard of hearing    Headache    migraine   Hyperlipidemia    Hypertension    Insomnia    Migraine    Osteoarthritis    Unspecified hypothyroidism    Vaginitis    Wears glasses    Wears hearing aid    B/L    PAST SURGICAL HISTORY: Past Surgical History:  Procedure Laterality Date   BIOPSY N/A 01/18/2021   Procedure: BIOPSY;  Surgeon: Harvel Quale, MD;  [facility];  Service: Gastroenterology;  Laterality: N/A;   CATARACT EXTRACTION W/PHACO Right 05/08/2019   Procedure: CATARACT EXTRACTION PHACO AND INTRAOCULAR LENS PLACEMENT (IOC);  Surgeon: Baruch Goldmann, MD;  Location: [facility];  Service: Ophthalmology;  Laterality: Right;  CDE: 10.02   COLONOSCOPY N/A 02/08/2016   Procedure: COLONOSCOPY;  Surgeon: [name], MD;  [facility]: [facility];  Service: Endoscopy;  Laterality: N/A;  1200 - moved to 11:45 - Ann to notify   CORONARY STENT INTERVENTION N/A 06/06/2018   Procedure: CORONARY STENT INTERVENTION;  Surgeon: [name], [name], MD;  Location: [place] [facility];  Service: Cardiovascular;  Laterality: N/A;   ESOPHAGOGASTRODUODENOSCOPY (EGD) WITH PROPOFOL N/A 01/18/2021   Procedure: ESOPHAGOGASTRODUODENOSCOPY (EGD) WITH PROPOFOL;  Surgeon: Harvel Quale, MD;  [facility]: [facility];  Service: Gastroenterology;  Laterality: N/A;  10:30AM   INTRAVASCULAR PRESSURE WIRE/FFR STUDY N/A 06/06/2018   Procedure: INTRAVASCULAR PRESSURE WIRE/FFR STUDY;  Surgeon: [name], [name], MD;  Location: [place] [facility];  Service: Cardiovascular;   Laterality: N/A;   IR ANGIO INTRA EXTRACRAN SEL COM CAROTID INNOMINATE BILAT MOD SED  09/01/2019   IR ANGIO INTRA EXTRACRAN SEL INTERNAL CAROTID UNI L MOD SED  12/21/2019   IR ANGIO VERTEBRAL SEL VERTEBRAL BILAT MOD SED  09/01/2019   IR ANGIOGRAM FOLLOW UP STUDY  12/21/2019   IR TRANSCATH/EMBOLIZ  12/21/2019   LEFT HEART CATH AND CORONARY ANGIOGRAPHY N/A 04/24/2019   Procedure: LEFT HEART CATH AND CORONARY ANGIOGRAPHY;  Surgeon: Belva Crome, MD;  Location: [place] [facility];  Service: Cardiovascular;  Laterality: N/A;   LEFT HEART CATHETERIZATION WITH CORONARY ANGIOGRAM N/A 11/19/2014   Procedure: LEFT HEART CATHETERIZATION WITH CORONARY ANGIOGRAM;  Surgeon: [name], MD;  Location: [facility];  Service: Cardiovascular;  Laterality: N/A;   RADIOLOGY WITH ANESTHESIA N/A 11/23/2019   Procedure: Drucilla Schmidt;  Surgeon: Luanne Bras, MD;  Location: [place];  Service: Radiology;  Laterality: N/A;   RADIOLOGY WITH ANESTHESIA N/A 12/21/2019   Procedure: EMBOLIZATION;  Surgeon: Luanne Bras, MD;  Location: [place];  Service: Radiology;  Laterality: N/A;   RIGHT/LEFT HEART CATH AND CORONARY ANGIOGRAPHY N/A 06/06/2018   Procedure: RIGHT/LEFT HEART CATH AND CORONARY ANGIOGRAPHY;  Surgeon: [name], [name], MD;  Location: [place] [facility];  Service: Cardiovascular;  Laterality: N/A;    FAMILY HISTORY: Family History  Problem Relation Age of Onset   Dementia Mother    Heart disease Mother        heart attack    Hypertension Mother    Heart attack Father    Heart disease Father        heart attack    Hypertension Father     SOCIAL HISTORY: Social History   Socioeconomic History   Marital status: Married    Spouse name: Richard   Number of children: 0   Years of education: 12   Highest education level: Not on file  Occupational History   Not on file  Tobacco Use   Smoking status: Never   Smokeless tobacco: Never  Vaping Use   Vaping Use: Never used  Substance and Sexual  Activity   Alcohol use: Never   Drug use: Never   Sexual activity: Not on file  Other Topics Concern   Not on file  Social History Narrative   Pt originally from the [place] been in [place] for 20 years  ,lives with husband   Caffeine coffee 1 c daily    Social Determinants of Health   Financial Resource Strain: Low Risk    Difficulty of Paying Living Expenses: Not hard at all  Food Insecurity: No Food Insecurity   Worried About Charity fundraiser in the Last Year: Never true   Arboriculturist in the Last Year: Never true  Transportation Needs: No Transportation Needs   Lack of Transportation (Medical): No   Lack of Transportation (Non-Medical): No  Physical Activity: Sufficiently Active   Days of Exercise per Week: 7 days   Minutes of Exercise per Session: 30 min  Stress: No Stress Concern Present   Feeling of Stress : Not at all  Social Connections: Moderately Isolated   Frequency of Communication with Friends and Family: Three times a week   Frequency of Social Gatherings with Friends and Family: Once a week   Attends Religious Services: Never   Marine scientist or Organizations: No   Attends Music therapist: Never   Marital Status: Married  Human resources officer Violence: Not At Risk   Fear of Current or Ex-Partner: No   Emotionally Abused: No   Physically Abused: No   Sexually Abused: No     PHYSICAL EXAM  GENERAL EXAM/CONSTITUTIONAL: Vitals:  Vitals:   04/19/21 1022  BP: (!) 164/75  Pulse: (!) 55  Weight: 131 lb (59.4 kg)  Height: 5\' 4"  (1.626 m)   Body mass index is 22.49 kg/m. Wt Readings from Last 3 Encounters:  04/19/21 131 lb (59.4 kg)  04/11/21 133 lb (60.3 kg)  04/05/21 132 lb 0.4 oz (59.9 kg)   Patient is in no distress; well developed, nourished and groomed; neck is supple  CARDIOVASCULAR: Examination of carotid arteries is normal; no  carotid bruits Regular rate and rhythm, no murmurs Examination of peripheral vascular  system by observation and palpation is normal  EYES: Ophthalmoscopic exam of optic discs and posterior segments is normal; no papilledema or hemorrhages No results found.  MUSCULOSKELETAL: Gait, strength, tone, movements noted in Neurologic exam below  NEUROLOGIC: MENTAL STATUS:  MMSE - Mini Mental State Exam 04/19/2021  Orientation to time 4  Orientation to Place 3  Registration 2  Attention/ Calculation 0  Recall 0  Language- name 2 objects 2  Language- repeat 0  Language- follow 3 step command 3  Language- read & follow direction 1  Write a sentence 1  Copy design 0  Total score 16   awake, alert, oriented to person, place and time recent and remote memory intact normal attention and concentration language fluent, comprehension intact, naming intact fund of knowledge appropriate  CRANIAL NERVE:  2nd - no papilledema on fundoscopic exam 2nd, 3rd, 4th, 6th - pupils equal and reactive to light, visual fields full to confrontation, extraocular muscles intact, no nystagmus 5th - facial sensation symmetric 7th - facial strength symmetric 8th - hearing intact 9th - palate elevates symmetrically, uvula midline 11th - shoulder shrug symmetric 12th - tongue protrusion midline  MOTOR:  normal bulk and tone, full strength in the BUE, BLE  SENSORY:  normal and symmetric to light touch, temperature, vibration  COORDINATION:  finger-nose-finger, fine finger movements normal  REFLEXES:  deep tendon reflexes present and symmetric  GAIT/STATION:  narrow based gait     DIAGNOSTIC DATA (LABS, IMAGING, TESTING) - I reviewed patient records, labs, notes, testing and imaging myself where available.  Lab Results  Component Value Date   WBC 3.9 01/04/2021   HGB 12.6 01/04/2021   HCT 38.1 01/04/2021   MCV 95 01/04/2021   PLT 287 01/04/2021      Component Value Date/Time   NA 143 01/04/2021 1123   K 4.6 01/04/2021 1123   CL 108 (H) 01/04/2021 1123   CO2 20 01/04/2021  1123   GLUCOSE 98 01/04/2021 1123   GLUCOSE 101 (H) 03/28/2020 1323   BUN 14 01/04/2021 1123   CREATININE 0.60 02/15/2021 0901   CREATININE 0.68 10/21/2019 1019   CALCIUM 9.8 01/04/2021 1123   PROT 7.2 01/04/2021 1123   ALBUMIN 4.7 01/04/2021 1123   AST 24 01/04/2021 1123   ALT 14 01/04/2021 1123   ALKPHOS 62 01/04/2021 1123   BILITOT 0.4 01/04/2021 1123   GFRNONAA >60 03/28/2020 1323   GFRNONAA 89 10/21/2019 1019   GFRAA >60 03/28/2020 1323   GFRAA 103 10/21/2019 1019   Lab Results  Component Value Date   CHOL 130 01/04/2021   HDL 67 01/04/2021   LDLCALC 50 01/04/2021   TRIG 63 01/04/2021   CHOLHDL 1.9 01/04/2021   Lab Results  Component Value Date   HGBA1C 6.1 (H) 06/29/2019   No results found for: VITAMINB12 Lab Results  Component Value Date   TSH 1.880 01/04/2021    12/21/19  Status post endovascular embolization of 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    ASSESSMENT AND PLAN  70 y.o. year old female here with:  Dx:  1. Migraine without aura and without status migrainosus, not intractable   2. 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 Estanislado Pandy  Meds  ordered this encounter  Medications   Erenumab-aooe (AIMOVIG) 45 MG/ML SOAJ    Sig: Inject 70 mg into the skin every 30 (thirty) days.    Dispense:  4.5 mL    Refill:  4   Rimegepant Sulfate (NURTEC) 75 MG TBDP    Sig: Take 75 mg by mouth daily as needed.    Dispense:  8 tablet    Refill:  6   Return in about 6 months (around 10/20/2021).    Penni Bombard, MD 11/22/9796, 92:11 AM Certified in Neurology, Neurophysiology and Neuroimaging  [facility] [address], [place], [place] [address] [phone]

## 2021-04-19 NOTE — Patient Instructions (Signed)
  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 Estanislado Pandy

## 2021-04-20 ENCOUNTER — Telehealth: Payer: Self-pay | Admitting: *Deleted

## 2021-04-20 NOTE — Telephone Encounter (Signed)
Aimovig PA, key: [id number], [id number], failed topiramate.   Your information has been sent to [facility].

## 2021-04-24 NOTE — Telephone Encounter (Signed)
[id number]. AIMOVIG INJ '70MG'$ /ML is approved through 09/30/2021. Your patient may now fill this prescription and it will be covered. Faxed approval to her pharmacy.

## 2021-05-03 ENCOUNTER — Other Ambulatory Visit: Payer: Self-pay

## 2021-05-03 DIAGNOSIS — M7989 Other specified soft tissue disorders: Secondary | ICD-10-CM

## 2021-05-11 NOTE — Telephone Encounter (Signed)
error 

## 2021-05-15 ENCOUNTER — Encounter (INDEPENDENT_AMBULATORY_CARE_PROVIDER_SITE_OTHER): Payer: Self-pay | Admitting: Gastroenterology

## 2021-05-15 ENCOUNTER — Ambulatory Visit (INDEPENDENT_AMBULATORY_CARE_PROVIDER_SITE_OTHER): Payer: Medicare Other | Admitting: Gastroenterology

## 2021-05-15 ENCOUNTER — Other Ambulatory Visit: Payer: Self-pay

## 2021-05-15 VITALS — BP 154/78 | HR 74 | Temp 98.3°F | Ht 63.0 in | Wt 132.5 lb

## 2021-05-15 DIAGNOSIS — K31A0 Gastric intestinal metaplasia, unspecified: Secondary | ICD-10-CM | POA: Insufficient documentation

## 2021-05-15 DIAGNOSIS — A048 Other specified bacterial intestinal infections: Secondary | ICD-10-CM | POA: Diagnosis not present

## 2021-05-15 DIAGNOSIS — K31A19 Gastric intestinal metaplasia without dysplasia, unspecified site: Secondary | ICD-10-CM | POA: Insufficient documentation

## 2021-05-15 NOTE — Patient Instructions (Signed)
Schedule urea breath test - needs to stop omeprazole 2 weeks before performing the test (can restart the omeprazole as usual once the test has been performed) Repeat EGD and colonoscopy in 2024

## 2021-05-15 NOTE — Progress Notes (Signed)
Tanya [name], M.D. Gastroenterology & Hepatology [facility] [address] [place], [place] [address]  Primary Care Physician: Tanya Helper, MD [address], [place]  [id number]  I will communicate my assessment and recommendations to the referring MD via EMR.  Problems: H. Pylori gastritis Gastric intestinal metaplasia GERD  History of Present Illness: Tanya [name] is a 70 y.o. female from [place] with [place] aneurysm surgery in 2020, CAD s/p stent, GERD, H. pylori gastritis and gastric intestinal metaplasia, who presents for follow up of abdominal pain.  The patient was last seen on 01/12/2021. At that time, the patient was scheduled for an EGD.  She was also advised to take MiraLAX on a daily basis for management of constipation.  She was started on omeprazole 40 mg every day to control her heartburn symptoms. The patient underwent the EGD on 01/18/2021 which showed presence of a small hiatal hernia and erythema in the gastric fundus.  Biopsies were positive for H. pylori.  There was also presence of gastrointestinal metaplasia but the location was un clear.  Duodenum was normal.  Due to this, patient was prescribed a quadruple bismuth regimen which she took compliantly.  Patient reports feeling well after finishing her antibiotic course.  Denies having any complaints but she only endorses very occasionally she has some pain in the R side of her abdomen.  However she reported she feels much better than in the past and reports that the pain is very infrequent and is very mild. The patient denies having any nausea, vomiting, fever, chills, hematochezia, melena, hematemesis, abdominal distention, diarrhea, jaundice, pruritus or weight loss.  Overall she feels in better spirits.  Reports that as long as she takes her omeprazole she does not have any heartburn episodes.  Notably, the patient also underwent a CT of the  abdomen and pelvis with IV contrast on 02/15/2021 which was within normal limits.  Has 1 BM per day  Last EGD: As above Last Colonoscopy: Last Colonoscopy:2017 -- One 4 mm polyp in the cecum, removed with a cold snare. Path TA. - External hemorrhoids. 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 s/p DESx2 to proximal LAD in 06/2018 b. cath in 04/2019 showing 40% mid-LAD ISR with no significant stenosis along LM, LCx, or RCA   Cystitis    Depression    Elevated liver enzymes    Fatty liver    GERD (gastroesophageal reflux disease)    Hard of hearing    Headache    migraine   Hyperlipidemia    Hypertension    Insomnia    Migraine    Osteoarthritis    Unspecified hypothyroidism    Vaginitis    Wears glasses    Wears hearing aid    B/L    Past Surgical History: Past Surgical History:  Procedure Laterality Date   BIOPSY N/A 01/18/2021   Procedure: BIOPSY;  Surgeon: Harvel Quale, MD;  [facility];  Service: Gastroenterology;  Laterality: N/A;   CATARACT EXTRACTION W/PHACO Right 05/08/2019   Procedure: CATARACT EXTRACTION PHACO AND INTRAOCULAR LENS PLACEMENT (IOC);  Surgeon: Baruch Goldmann, MD;  [facility]: [facility];  Service: Ophthalmology;  Laterality: Right;  CDE: 10.02  COLONOSCOPY N/A 02/08/2016   Procedure: COLONOSCOPY;  Surgeon: [name], MD;  [facility];  Service: Endoscopy;  Laterality: N/A;  1200 - moved to 11:45 - Ann to notify   CORONARY STENT INTERVENTION N/A 06/06/2018   Procedure: CORONARY STENT INTERVENTION;  Surgeon: [name], [name], MD;  Location: [place] [facility];  Service: Cardiovascular;  Laterality: N/A;   ESOPHAGOGASTRODUODENOSCOPY (EGD) WITH PROPOFOL N/A 01/18/2021   Procedure: ESOPHAGOGASTRODUODENOSCOPY (EGD) WITH PROPOFOL;  Surgeon: Harvel Quale, MD;  [facility];  Service: Gastroenterology;  Laterality: N/A;  10:30AM   INTRAVASCULAR PRESSURE WIRE/FFR STUDY N/A 06/06/2018   Procedure: INTRAVASCULAR PRESSURE WIRE/FFR STUDY;  Surgeon: [name], [name], MD;  Location: [place] [facility];  Service: Cardiovascular;  Laterality: N/A;   IR ANGIO INTRA EXTRACRAN SEL COM CAROTID INNOMINATE BILAT MOD SED  09/01/2019   IR ANGIO INTRA EXTRACRAN SEL INTERNAL CAROTID UNI L MOD SED  12/21/2019   IR ANGIO VERTEBRAL SEL VERTEBRAL BILAT MOD SED  09/01/2019   IR ANGIOGRAM FOLLOW UP STUDY  12/21/2019   IR TRANSCATH/EMBOLIZ  12/21/2019   LEFT HEART CATH AND CORONARY ANGIOGRAPHY N/A 04/24/2019   Procedure: LEFT HEART CATH AND CORONARY ANGIOGRAPHY;  Surgeon: Belva Crome, MD;  Location: [place] [facility];  Service: Cardiovascular;  Laterality: N/A;   LEFT HEART CATHETERIZATION WITH CORONARY ANGIOGRAM N/A 11/19/2014   Procedure: LEFT HEART CATHETERIZATION WITH CORONARY ANGIOGRAM;  Surgeon: [name], MD;  Location: [facility];  Service: Cardiovascular;  Laterality: N/A;   RADIOLOGY WITH ANESTHESIA N/A 11/23/2019   Procedure: Drucilla Schmidt;  Surgeon: Luanne Bras, MD;  Location: [place];  Service: Radiology;  Laterality: N/A;   RADIOLOGY WITH ANESTHESIA N/A 12/21/2019   Procedure: EMBOLIZATION;  Surgeon: Luanne Bras, MD;  Location: [place];  Service: Radiology;  Laterality: N/A;   RIGHT/LEFT HEART CATH AND CORONARY ANGIOGRAPHY N/A 06/06/2018   Procedure: RIGHT/LEFT HEART CATH AND CORONARY ANGIOGRAPHY;  Surgeon: [name], [name], MD;  Location: [place] [facility];  Service: Cardiovascular;  Laterality: N/A;    Family History: Family History  Problem Relation Age of Onset   Dementia Mother    Heart disease Mother        heart attack    Hypertension Mother    Heart attack Father    Heart disease Father        heart attack    Hypertension Father     Social History: Social History   Tobacco Use  Smoking Status Never   Smokeless Tobacco Never   Social History   Substance and Sexual Activity  Alcohol Use Never   Social History   Substance and Sexual Activity  Drug Use Never    Allergies: No Known Allergies  Medications: Current Outpatient Medications  Medication Sig Dispense Refill   acetaminophen (TYLENOL) 500 MG tablet Take 1,000 mg by mouth every 6 (six) hours as needed for moderate pain.     amLODipine (NORVASC) 10 MG tablet Take 1 tablet (10 mg total) by mouth daily. 30 tablet 5   Ascorbic Acid (VITAMIN C) 1000 MG tablet Take 1,000 mg by mouth daily.     aspirin EC 81 MG tablet Take 81 mg by mouth daily. Swallow whole.     cholecalciferol (VITAMIN D3) 25 MCG (1000 UNIT) tablet Take 1,000 Units by mouth daily.     cloNIDine (CATAPRES) 0.2 MG tablet 0.2 mg at bedtime.     clopidogrel (PLAVIX) 75 MG tablet Take 1 tablet (75 mg  total) by mouth daily. 90 tablet 3   Erenumab-aooe (AIMOVIG) 70 MG/ML SOAJ Inject 70 mg into the skin every 30 (thirty) days. 4.5 mL 4   metoprolol succinate (TOPROL-XL) 25 MG 24 hr tablet Take 0.5 tablets (12.5 mg total) by mouth daily. 45 tablet 3   nitroGLYCERIN (NITROSTAT) 0.4 MG SL tablet Place 1 tablet (0.4 mg total) under the tongue every 5 (five) minutes as needed. 25 tablet 3   omeprazole (PRILOSEC) 40 MG capsule Take 1 capsule (40 mg total) by mouth daily. 90 capsule 3   PARoxetine (PAXIL) 10 MG tablet Take 1 tablet (10 mg total) by mouth daily. 90 tablet 1   potassium chloride SA (KLOR-CON) 20 MEQ tablet Take 1 tablet (20 mEq total) by mouth daily. 30 tablet 5   prednisoLONE acetate (PRED FORTE) 1 % ophthalmic suspension prednisolone acetate 1 % eye drops,suspension  INSTILL 1 DROP INTO RIGHT EYE EVERY 8 HOURS SHAKE WELL     Rimegepant Sulfate (NURTEC) 75 MG TBDP Take 75 mg by mouth daily as needed. 8 tablet 6   rosuvastatin (CRESTOR) 40 MG tablet TAKE 1 TABLET BY MOUTH  DAILY 90 tablet 3   temazepam (RESTORIL) 15 MG capsule TAKE 1 CAPSULE BY MOUTH AT BEDTIME  AS NEEDED FOR SLEEP 30 capsule 5   topiramate (TOPAMAX) 25 MG capsule Take 50 mg by mouth daily.     Simethicone 80 MG TABS Take 1 tablet (80 mg total) by mouth 2 (two) times daily as needed. (Patient not taking: Reported on 05/15/2021) 60 tablet 1   No current facility-administered medications for this visit.    Review of Systems: GENERAL: negative for malaise, night sweats HEENT: No changes in hearing or vision, no nose bleeds or other nasal problems. NECK: Negative for lumps, goiter, pain and significant neck swelling RESPIRATORY: Negative for cough, wheezing CARDIOVASCULAR: Negative for chest pain, leg swelling, palpitations, orthopnea GI: SEE HPI MUSCULOSKELETAL: Negative for joint pain or swelling, back pain, and muscle pain. SKIN: Negative for lesions, rash PSYCH: Negative for sleep disturbance, mood disorder and recent psychosocial stressors. HEMATOLOGY Negative for prolonged bleeding, bruising easily, and swollen nodes. ENDOCRINE: Negative for cold or heat intolerance, polyuria, polydipsia and goiter. NEURO: negative for tremor, gait imbalance, syncope and seizures. The remainder of the review of systems is noncontributory.   Physical Exam: BP (!) 154/78 (BP Location: Left Arm, Patient Position: Sitting, Cuff Size: Normal)   Pulse 74   Temp 98.3 F (36.8 C) (Oral)   Ht '5\' 3"'$  (1.6 m)   Wt 132 lb 8 oz (60.1 kg)   BMI 23.47 kg/m  GENERAL: The patient is AO x3, in no acute distress. HEENT: Head is normocephalic and atraumatic. EOMI are intact. Mouth is well hydrated and without lesions. NECK: Supple. No masses LUNGS: Clear to auscultation. No presence of rhonchi/wheezing/rales. Adequate chest expansion HEART: RRR, normal s1 and s2. ABDOMEN: Soft, nontender, no guarding, no peritoneal signs, and nondistended. BS +. No masses. EXTREMITIES: Without any cyanosis, clubbing, rash, lesions or edema. NEUROLOGIC: AOx3, no focal motor deficit. SKIN: no jaundice, no  rashes  Imaging/Labs: as above  I personally reviewed and interpreted the available labs, imaging and endoscopic files.  Impression and Plan: Tanya [name] is a 70 y.o. female from [place] with [place] aneurysm surgery in 2020, CAD s/p stent, GERD, H. pylori gastritis and gastric intestinal metaplasia, who presents for follow up of abdominal pain.  The patient has presented major improvement of her symptoms after being treated for H. pylori.  Most likely her symptoms were related to this infection, which actually led to the presence of intestinal metaplasia in her most recent histologic samples.  We will need to verify the eradication of this infection with urea breath test, this will be ordered and the patient was asked to hold on her PPI for 2 weeks prior to the test.  She can restart taking her PPI after this test has been performed as it has adequately controlled her GERD symptoms.  The patient understood and agreed.  In terms of her gastrointestinal metaplasia and colorectal cancer screening, will need to repeat an EGD and colonoscopy in 2 years at the same time.  She agreed to proceed with this at that time.  - Schedule urea breath test - needs to stop omeprazole 2 weeks before performing the test (can restart the omeprazole as usual once the test has been performed) - Repeat EGD and colonoscopy in 2024  All questions were answered.      Harvel Quale, MD Gastroenterology and Hepatology [facility]

## 2021-05-19 ENCOUNTER — Ambulatory Visit: Payer: Medicare Other | Admitting: Family Medicine

## 2021-05-22 ENCOUNTER — Ambulatory Visit (HOSPITAL_COMMUNITY)
Admission: RE | Admit: 2021-05-22 | Discharge: 2021-05-22 | Disposition: A | Discharge status: Home / Self Care | Payer: Medicare Other | Source: Ambulatory Visit | Attending: Surgery | Admitting: Surgery

## 2021-05-22 ENCOUNTER — Ambulatory Visit (INDEPENDENT_AMBULATORY_CARE_PROVIDER_SITE_OTHER): Payer: Medicare Other | Admitting: Physician Assistant

## 2021-05-22 VITALS — BP 157/83 | HR 67 | Temp 97.2°F | Resp 14 | Ht 64.0 in | Wt 131.0 lb

## 2021-05-22 DIAGNOSIS — I872 Venous insufficiency (chronic) (peripheral): Secondary | ICD-10-CM | POA: Diagnosis not present

## 2021-05-22 DIAGNOSIS — M7989 Other specified soft tissue disorders: Secondary | ICD-10-CM | POA: Diagnosis not present

## 2021-05-22 NOTE — Progress Notes (Signed)
Requested by:  [name], MD [address], [place],  [place] [address]  Reason for consultation: Left calf discomfort   History of Present Illness   Tanya [name] is a 70 y.o. ([birth year]) female who presents for evaluation of left lower extremity discomfort.  Patient states she was recently evaluated for left lower extremity pain treated for osteoarthritis with resolution of her pain.  She states she has some mild left calf discomfort.  She denies prior history of DVT or vein intervention.  She denies claudication or rest pain.  She stands on her feet 8 hours a day 5 days a week.  She is employed as a Product/process development scientist.  She states she was told by her physician to exercise every day and she walks at least 30 minutes every morning.  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 s/p DESx2 to proximal LAD in 06/2018 b. cath in 04/2019 showing 40% mid-LAD ISR with no significant stenosis along LM, LCx, or RCA   Cystitis    Depression    Elevated liver enzymes    Fatty liver    GERD (gastroesophageal reflux disease)    Hard of hearing    Headache    migraine   Hyperlipidemia    Hypertension    Insomnia    Migraine    Osteoarthritis    Unspecified hypothyroidism    Vaginitis    Wears glasses    Wears hearing aid    B/L    Past Surgical History:  Procedure Laterality Date   BIOPSY N/A 01/18/2021   Procedure: BIOPSY;  Surgeon: Harvel Quale, MD;  [facility];  Service: Gastroenterology;  Laterality: N/A;   CATARACT EXTRACTION W/PHACO Right 05/08/2019   Procedure: CATARACT EXTRACTION PHACO AND INTRAOCULAR LENS PLACEMENT (IOC);  Surgeon: Baruch Goldmann, MD;  [facility];  Service: Ophthalmology;  Laterality: Right;  CDE: 10.02   COLONOSCOPY N/A 02/08/2016   Procedure: COLONOSCOPY;  Surgeon: [name], MD;  [facility];  Service: Endoscopy;  Laterality: N/A;  1200 - moved to 11:45 - Ann to notify   CORONARY STENT INTERVENTION N/A 06/06/2018   Procedure: CORONARY STENT INTERVENTION;  Surgeon: [name], [name], MD;  Location: [place] [facility];  Service: Cardiovascular;  Laterality: N/A;   ESOPHAGOGASTRODUODENOSCOPY (EGD) WITH PROPOFOL N/A 01/18/2021   Procedure: ESOPHAGOGASTRODUODENOSCOPY (EGD) WITH PROPOFOL;  Surgeon: Harvel Quale, MD;  [facility];  Service: Gastroenterology;  Laterality: N/A;  10:30AM   INTRAVASCULAR PRESSURE WIRE/FFR STUDY N/A 06/06/2018   Procedure: INTRAVASCULAR PRESSURE WIRE/FFR STUDY;  Surgeon: [name], [name], MD;  Location: [place] [facility];  Service: Cardiovascular;  Laterality: N/A;   IR ANGIO INTRA EXTRACRAN SEL COM CAROTID INNOMINATE BILAT MOD SED  09/01/2019   IR ANGIO INTRA EXTRACRAN SEL INTERNAL CAROTID UNI L MOD  SED  12/21/2019   IR ANGIO VERTEBRAL SEL VERTEBRAL BILAT MOD SED  09/01/2019   IR ANGIOGRAM FOLLOW UP STUDY  12/21/2019   IR TRANSCATH/EMBOLIZ  12/21/2019   LEFT HEART CATH AND CORONARY ANGIOGRAPHY N/A 04/24/2019   Procedure: LEFT HEART CATH AND CORONARY ANGIOGRAPHY;  Surgeon: Belva Crome, MD;  Location: [place] [facility];  Service: Cardiovascular;  Laterality: N/A;   LEFT HEART CATHETERIZATION WITH CORONARY ANGIOGRAM N/A 11/19/2014   Procedure: LEFT HEART CATHETERIZATION WITH CORONARY ANGIOGRAM;  Surgeon: [name], MD;  Location: [facility];  Service: Cardiovascular;  Laterality: N/A;   RADIOLOGY WITH ANESTHESIA N/A 11/23/2019   Procedure: Drucilla Schmidt;   Surgeon: Luanne Bras, MD;  Location: [place];  Service: Radiology;  Laterality: N/A;   RADIOLOGY WITH ANESTHESIA N/A 12/21/2019   Procedure: EMBOLIZATION;  Surgeon: Luanne Bras, MD;  Location: [place];  Service: Radiology;  Laterality: N/A;   RIGHT/LEFT HEART CATH AND CORONARY ANGIOGRAPHY N/A 06/06/2018   Procedure: RIGHT/LEFT HEART CATH AND CORONARY ANGIOGRAPHY;  Surgeon: [name], [name], MD;  Location: [place] [facility];  Service: Cardiovascular;  Laterality: N/A;    Social History   Socioeconomic History   Marital status: Married    Spouse name: Richard   Number of children: 0   Years of education: 12   Highest education level: Not on file  Occupational History   Not on file  Tobacco Use   Smoking status: Never   Smokeless tobacco: Never  Vaping Use   Vaping Use: Never used  Substance and Sexual Activity   Alcohol use: Never   Drug use: Never   Sexual activity: Not on file  Other Topics Concern   Not on file  Social History Narrative   Pt originally from the [place] been in [place] for 20 years  ,lives with husband   Caffeine coffee 1 c daily    Social Determinants of Health   Financial Resource Strain: Low Risk    Difficulty of Paying Living Expenses: Not hard at all  Food Insecurity: No Food Insecurity   Worried About Charity fundraiser in the Last Year: Never true   Arboriculturist in the Last Year: Never true  Transportation Needs: No Transportation Needs   Lack of Transportation (Medical): No   Lack of Transportation (Non-Medical): No  Physical Activity: Sufficiently Active   Days of Exercise per Week: 7 days   Minutes of Exercise per Session: 30 min  Stress: No Stress Concern Present   Feeling of Stress : Not at all  Social Connections: Moderately Isolated   Frequency of Communication with Friends and Family: Three times a week   Frequency of Social Gatherings with Friends and Family: Once a week   Attends Religious Services: Never   Museum/gallery conservator or Organizations: No   Attends Music therapist: Never   Marital Status: Married  Human resources officer Violence: Not At Risk   Fear of Current or Ex-Partner: No   Emotionally Abused: No   Physically Abused: No   Sexually Abused: No    Family History  Problem Relation Age of Onset   Dementia Mother    Heart disease Mother        heart attack    Hypertension Mother    Heart attack Father    Heart disease Father        heart attack    Hypertension Father     Current Outpatient Medications  Medication Sig Dispense  Refill   acetaminophen (TYLENOL) 500 MG tablet Take 1,000 mg by mouth every 6 (six) hours as needed for moderate pain.     amLODipine (NORVASC) 10 MG tablet Take 1 tablet (10 mg total) by mouth daily. 30 tablet 5   Ascorbic Acid (VITAMIN C) 1000 MG tablet Take 1,000 mg by mouth daily.     aspirin EC 81 MG tablet Take 81 mg by mouth daily. Swallow whole.     cholecalciferol (VITAMIN D3) 25 MCG (1000 UNIT) tablet Take 1,000 Units by mouth daily.     cloNIDine (CATAPRES) 0.2 MG tablet 0.2 mg at bedtime.     clopidogrel (PLAVIX) 75 MG tablet Take 1 tablet (75 mg total) by mouth daily. 90 tablet 3   Erenumab-aooe (AIMOVIG) 70 MG/ML SOAJ Inject 70 mg into the skin every 30 (thirty) days. 4.5 mL 4   metoprolol succinate (TOPROL-XL) 25 MG 24 hr tablet Take 0.5 tablets (12.5 mg total) by mouth daily. 45 tablet 3   nitroGLYCERIN (NITROSTAT) 0.4 MG SL tablet Place 1 tablet (0.4 mg total) under the tongue every 5 (five) minutes as needed. 25 tablet 3   omeprazole (PRILOSEC) 40 MG capsule Take 1 capsule (40 mg total) by mouth daily. 90 capsule 3   PARoxetine (PAXIL) 10 MG tablet Take 1 tablet (10 mg total) by mouth daily. 90 tablet 1   potassium chloride SA (KLOR-CON) 20 MEQ tablet Take 1 tablet (20 mEq total) by mouth daily. 30 tablet 5   prednisoLONE acetate (PRED FORTE) 1 % ophthalmic suspension prednisolone acetate 1 % eye drops,suspension  INSTILL 1 DROP INTO  RIGHT EYE EVERY 8 HOURS SHAKE WELL     Rimegepant Sulfate (NURTEC) 75 MG TBDP Take 75 mg by mouth daily as needed. 8 tablet 6   rosuvastatin (CRESTOR) 40 MG tablet TAKE 1 TABLET BY MOUTH  DAILY 90 tablet 3   Simethicone 80 MG TABS Take 1 tablet (80 mg total) by mouth 2 (two) times daily as needed. (Patient not taking: Reported on 05/15/2021) 60 tablet 1   temazepam (RESTORIL) 15 MG capsule TAKE 1 CAPSULE BY MOUTH AT BEDTIME AS NEEDED FOR SLEEP 30 capsule 5   topiramate (TOPAMAX) 25 MG capsule Take 50 mg by mouth daily.     No current facility-administered medications for this visit.    No Known Allergies  REVIEW OF SYSTEMS (negative unless checked):   Cardiac:  '[]'$  Chest pain or chest pressure? '[]'$  Shortness of breath upon activity? '[]'$  Shortness of breath when lying flat? '[]'$  Irregular heart rhythm?  Vascular:  '[]'$  Pain in calf, thigh, or hip brought on by walking? '[]'$  Pain in feet at night that wakes you up from your sleep? '[]'$  Blood clot in your veins? '[]'$  Leg swelling?  Pulmonary:  '[]'$  Oxygen at home? '[]'$  Productive cough? '[]'$  Wheezing?  Neurologic:  '[]'$  Sudden weakness in arms or legs? '[]'$  Sudden numbness in arms or legs? '[]'$  Sudden onset of difficult speaking or slurred speech? '[]'$  Temporary loss of vision in one eye? '[]'$  Problems with dizziness?  Gastrointestinal:  '[]'$  Blood in stool? '[]'$  Vomited blood?  Genitourinary:  '[]'$  Burning when urinating? '[]'$  Blood in urine?  Psychiatric:  '[]'$  Major depression  Hematologic:  '[]'$  Bleeding problems? '[]'$  Problems with blood clotting?  Dermatologic:  '[]'$  Rashes or ulcers?  Constitutional:  '[]'$  Fever or chills?  Ear/Nose/Throat:  '[]'$  Change in hearing? '[]'$  Nose bleeds? '[]'$  Sore throat?  Musculoskeletal:  '[]'$  Back pain? '[]'$  Joint pain? '[]'$  Muscle pain?  Physical Examination    There were no vitals filed for this visit. There is no height or weight on file to calculate BMI.  General:  WDWN in NAD; vital signs documented  above Gait: Not observed HENT: WNL, normocephalic Pulmonary: normal non-labored breathing , without Rales, rhonchi,  wheezing Cardiac: regular HR,  Skin: without rashes Vascular Exam/Pulses: 2+ dorsalis pedis, posterior tibial pulses bilaterally Extremities: without varicose veins, without reticular veins, without edema, without stasis pigmentation, without lipodermatosclerosis, without ulcers Musculoskeletal: no muscle wasting or atrophy  Neurologic: A&O X 3;  No focal weakness or paresthesias are detected Psychiatric:  The pt has Normal affect.  Non-invasive Vascular Imaging   05/22/2021 Left lower extremity venous reflux Focal area of the left greater saphenous vein reflux at the knee.  The left greater saphenous vein diameter ranges from 0.23 cm to 0.43 cm.  There is no evidence of deep venous thrombosis  Medical Decision Making   Asencion A Crumbaugh is a 70 y.o. female who presents with: Left lower extremity pain recently treated for osteoarthritis with good results.  She does complain of some mild left calf discomfort.  No evidence of arterial insufficiency.  Pedal pulses are palpable.  No symptoms referable to arterial disease.  No evidence of DVT or significant venous reflux. Based on the patient's history and examination, I recommend: Compression stockings when working and daily elevation.  Written information in this regard is given. I discussed with the patient the use of 15-20 mm knee-high compression stockings  Thank you for allowing [place] to participate in this patient's care.   Barbie Banner, PA-C [facility] Office: [phone]  05/22/2021, 3:24 PM  Clinic MD: Trula Slade

## 2021-05-23 ENCOUNTER — Other Ambulatory Visit: Payer: Self-pay

## 2021-05-31 DIAGNOSIS — A048 Other specified bacterial intestinal infections: Secondary | ICD-10-CM | POA: Diagnosis not present

## 2021-06-01 LAB — H. PYLORI BREATH TEST: H. pylori Breath Test: NOT DETECTED

## 2021-06-02 ENCOUNTER — Other Ambulatory Visit (INDEPENDENT_AMBULATORY_CARE_PROVIDER_SITE_OTHER): Payer: Self-pay

## 2021-06-27 ENCOUNTER — Telehealth: Payer: Self-pay

## 2021-06-27 ENCOUNTER — Other Ambulatory Visit: Payer: Self-pay | Admitting: *Deleted

## 2021-06-27 DIAGNOSIS — F5101 Primary insomnia: Secondary | ICD-10-CM

## 2021-06-27 MED ORDER — TEMAZEPAM 15 MG PO CAPS: ORAL_CAPSULE | ORAL | 5 refills | Status: DC

## 2021-06-27 NOTE — Telephone Encounter (Signed)
Patient spouse stopped by completely out of this medicine. Need refill  temazepam (RESTORIL) 15 MG capsule   Pharm[facility]: [facility]

## 2021-06-27 NOTE — Telephone Encounter (Signed)
Pt medication sent to pharmacy  

## 2021-07-21 ENCOUNTER — Ambulatory Visit: Payer: PRIVATE HEALTH INSURANCE | Admitting: Rheumatology

## 2021-07-22 ENCOUNTER — Other Ambulatory Visit: Payer: Self-pay | Admitting: Family Medicine

## 2021-08-02 DIAGNOSIS — L308 Other specified dermatitis: Secondary | ICD-10-CM | POA: Diagnosis not present

## 2021-08-02 NOTE — Progress Notes (Signed)
Cardiology Office Note  Date: 08/03/2021   ID: Tanya [name], DOB [birth year], MRN [id number]  PCP:  [name], MD  Cardiologist:  [name], MD Electrophysiologist:  None   Chief Complaint: 4 to 42-month follow-up  History of Present Illness: Tanya [name] is a 70 y.o. female with a history of HTN, CAD, carotid artery aneurysm, brain aneurysm, GERD, HLD.  She was last seen by Estella Husk PA on 02/14/2021.  She had some recent GI complaints per pulmonary and PCP notes and was started on pantoprazole.  On February 2022 she was complaining of epigastric pain around her right abdomen and indigestion.  Chronic shortness of breath seem to be improving.  She was walking on her treadmill 30 minutes daily without chest pain.  Blood pressure was high and she was anxious.  Clonidine was increased to 0.3 mg nightly and she was asked to monitor blood pressure at home.  She was referred to GI.  She underwent endoscopy on 01/18/2021 and found to have small hiatal hernia with erythematous mucosa in the gastric fundus which was biopsied and positive for H. pylori.  She had no further indigestion or GI pain after that visit.  Her chronic dyspnea on exertion was unchanged.  Blood pressure was doing much better.  She was running out of clonidine and pharmacy would not fill it until July.  Her DOE was followed by pulmonary.  Blood pressure was normal.  Doing better on increased clonidine dose of 0.3 mg nightly.  She had no chest pain.  She was continuing Crestor with LDL below 40 in April 2021.   She is here today for 4 to 28-month follow-up.  She has multiple complaints most most of which appear to be related to neurologic symptoms.  She is having some pain and discomfort in her neck and her right arm as well as numbness in her face and pain in her face along with sensation of burning.  She is also complaining of issues with her right mostly secondary to cataract surgery which she describes as  being unsuccessful.  She also describes some pain in her left knee which she has been getting injections for pain.  She states she knows she has arthritis.  Her blood pressure is better controlled since her clonidine was increased.  We discussed goal of 130/80 or less consistently.  She has an upcoming appointment with [facility] neurology in January for her neurologic symptoms.  She denies any anginal symptoms.  She does complain of some worsening shortness of breath.  She has been seeing pulmonary.  She states shortness of breath related to exertion.  Her last echocardiogram was 2 years ago.  We discussed possible repeat echocardiogram to reassess LV function, diastolic function and valvular function.   Past Medical History:  Diagnosis Date   Aneurysm ([place])    left ICA 09/2019   Anhedonia    Anxiety    Brain aneurysm    CAD (coronary artery disease)    a. s/p DESx2 to proximal LAD in 06/2018 b. cath in 04/2019 showing 40% mid-LAD ISR with no significant stenosis along LM, LCx, or RCA   Cystitis    Depression    Elevated liver enzymes    Fatty liver    GERD (gastroesophageal reflux disease)    Hard of hearing    Headache    migraine   Hyperlipidemia    Hypertension    Insomnia    Migraine    Osteoarthritis  Unspecified hypothyroidism    Vaginitis    Wears glasses    Wears hearing aid    B/L    Past Surgical History:  Procedure Laterality Date   BIOPSY N/A 01/18/2021   Procedure: BIOPSY;  Surgeon: Harvel Quale, MD;  [facility];  Service: Gastroenterology;  Laterality: N/A;   CATARACT EXTRACTION W/PHACO Right 05/08/2019   Procedure: CATARACT EXTRACTION PHACO AND INTRAOCULAR LENS PLACEMENT (IOC);  Surgeon: Baruch Goldmann, MD;  Location: [facility];  Service: Ophthalmology;  Laterality: Right;  CDE: 10.02   COLONOSCOPY N/A 02/08/2016   Procedure: COLONOSCOPY;  Surgeon: [name], MD;  [facility];  Service: Endoscopy;  Laterality: N/A;  1200 -  moved to 11:45 - Ann to notify   CORONARY STENT INTERVENTION N/A 06/06/2018   Procedure: CORONARY STENT INTERVENTION;  Surgeon: [name], [name], MD;  Location: [place] [facility];  Service: Cardiovascular;  Laterality: N/A;   ESOPHAGOGASTRODUODENOSCOPY (EGD) WITH PROPOFOL N/A 01/18/2021   Procedure: ESOPHAGOGASTRODUODENOSCOPY (EGD) WITH PROPOFOL;  Surgeon: Harvel Quale, MD;  [facility];  Service: Gastroenterology;  Laterality: N/A;  10:30AM   INTRAVASCULAR PRESSURE WIRE/FFR STUDY N/A 06/06/2018   Procedure: INTRAVASCULAR PRESSURE WIRE/FFR STUDY;  Surgeon: [name], [name], MD;  Location: [place] [facility];  Service: Cardiovascular;  Laterality: N/A;   IR ANGIO INTRA EXTRACRAN SEL COM CAROTID INNOMINATE BILAT MOD SED  09/01/2019   IR ANGIO INTRA EXTRACRAN SEL INTERNAL CAROTID UNI L MOD SED  12/21/2019   IR ANGIO VERTEBRAL SEL VERTEBRAL BILAT MOD SED  09/01/2019   IR ANGIOGRAM FOLLOW UP STUDY  12/21/2019   IR TRANSCATH/EMBOLIZ  12/21/2019   LEFT HEART CATH AND CORONARY ANGIOGRAPHY N/A 04/24/2019   Procedure: LEFT HEART CATH AND CORONARY ANGIOGRAPHY;  Surgeon: Belva Crome, MD;  Location: [place] [facility];  Service: Cardiovascular;  Laterality: N/A;   LEFT HEART CATHETERIZATION WITH CORONARY ANGIOGRAM N/A 11/19/2014   Procedure: LEFT HEART CATHETERIZATION WITH CORONARY ANGIOGRAM;  Surgeon: [name], MD;  Location: [facility] CATH LAB;  Service: Cardiovascular;  Laterality: N/A;   RADIOLOGY WITH ANESTHESIA N/A 11/23/2019   Procedure: Drucilla Schmidt;  Surgeon: Luanne Bras, MD;  Location: [place];  Service: Radiology;  Laterality: N/A;   RADIOLOGY WITH ANESTHESIA N/A 12/21/2019   Procedure: EMBOLIZATION;  Surgeon: Luanne Bras, MD;  Location: [place];  Service: Radiology;  Laterality: N/A;   RIGHT/LEFT HEART CATH AND CORONARY ANGIOGRAPHY N/A 06/06/2018   Procedure: RIGHT/LEFT HEART CATH AND CORONARY ANGIOGRAPHY;  Surgeon: [name], [name], MD;  Location: [place] [facility];   Service: Cardiovascular;  Laterality: N/A;    Current Outpatient Medications  Medication Sig Dispense Refill   acetaminophen (TYLENOL) 500 MG tablet Take 1,000 mg by mouth every 6 (six) hours as needed for moderate pain.     amLODipine (NORVASC) 10 MG tablet Take 1 tablet (10 mg total) by mouth daily. 30 tablet 5   Ascorbic Acid (VITAMIN C) 1000 MG tablet Take 1,000 mg by mouth daily.     aspirin EC 81 MG tablet Take 81 mg by mouth daily. Swallow whole.     cholecalciferol (VITAMIN D3) 25 MCG (1000 UNIT) tablet Take 1,000 Units by mouth daily.     cloNIDine (CATAPRES) 0.3 MG tablet Take 0.3 mg by mouth at bedtime.     clopidogrel (PLAVIX) 75 MG tablet Take 1 tablet (75 mg total) by mouth daily. 90 tablet 3   metoprolol succinate (TOPROL-XL) 25 MG 24 hr tablet Take 0.5  tablets (12.5 mg total) by mouth daily. 45 tablet 3   nitroGLYCERIN (NITROSTAT) 0.4 MG SL tablet Place 1 tablet (0.4 mg total) under the tongue every 5 (five) minutes as needed. 25 tablet 3   omeprazole (PRILOSEC) 40 MG capsule Take 1 capsule (40 mg total) by mouth daily. 90 capsule 3   PARoxetine (PAXIL) 10 MG tablet Take 1 tablet (10 mg total) by mouth daily. 90 tablet 1   Potassium Chloride ER 20 MEQ TBCR TAKE 1 TABLET BY MOUTH  DAILY 90 tablet 3   prednisoLONE acetate (PRED FORTE) 1 % ophthalmic suspension prednisolone acetate 1 % eye drops,suspension  INSTILL 1 DROP INTO RIGHT EYE EVERY 8 HOURS SHAKE WELL     rosuvastatin (CRESTOR) 40 MG tablet TAKE 1 TABLET BY MOUTH  DAILY 90 tablet 3   temazepam (RESTORIL) 15 MG capsule TAKE 1 CAPSULE BY MOUTH AT BEDTIME AS NEEDED FOR SLEEP 30 capsule 5   topiramate (TOPAMAX) 25 MG capsule Take 50 mg by mouth daily.     Erenumab-aooe (AIMOVIG) 70 MG/ML SOAJ Inject 70 mg into the skin every 30 (thirty) days. (Patient not taking: Reported on 08/03/2021) 4.5 mL 4   Rimegepant Sulfate (NURTEC) 75 MG TBDP Take 75 mg by mouth daily as needed. (Patient not taking: Reported on 08/03/2021) 8 tablet 6    Simethicone 80 MG TABS Take 1 tablet (80 mg total) by mouth 2 (two) times daily as needed. (Patient not taking: No sig reported) 60 tablet 1   No current facility-administered medications for this visit.   Allergies:  Patient has no known allergies.   Social History: The patient  reports that she has never smoked. She has never used smokeless tobacco. She reports that she does not drink alcohol and does not use drugs.   Family History: The patient's family history includes Dementia in her mother; Heart attack in her father; Heart disease in her father and mother; Hypertension in her father and mother.   ROS:  Please see the history of present illness. Otherwise, complete review of systems is positive for none.  All other systems are reviewed and negative.   Physical Exam: VS:  BP 138/80   Pulse 70   Ht 5\' 4"  (1.626 m)   Wt 137 lb 3.2 oz (62.2 kg)   SpO2 98%   BMI 23.55 kg/m , BMI Body mass index is 23.55 kg/m.  Wt Readings from Last 3 Encounters:  08/03/21 137 lb 3.2 oz (62.2 kg)  05/23/21 131 lb (59.4 kg)  05/15/21 132 lb 8 oz (60.1 kg)    General: Patient appears comfortable at rest. Neck: Supple, no elevated JVP or carotid bruits, no thyromegaly. Lungs: Clear to auscultation, nonlabored breathing at rest. Cardiac: Regular rate and rhythm, no S3 or significant systolic murmur, no pericardial rub. Extremities: No pitting edema, distal pulses 2+. Skin: Warm and dry. Musculoskeletal: No kyphosis. Neuropsychiatric: Alert and oriented x3, affect grossly appropriate.  ECG:    Recent Labwork: 01/04/2021: ALT 14; AST 24; BUN 14; Hemoglobin 12.6; Platelets 287; Potassium 4.6; Sodium 143; TSH 1.880 02/15/2021: Creatinine, Ser 0.60     Component Value Date/Time   CHOL 130 01/04/2021 1123   TRIG 63 01/04/2021 1123   HDL 67 01/04/2021 1123   CHOLHDL 1.9 01/04/2021 1123   CHOLHDL 2.0 01/27/2020 1022   VLDL 11 06/29/2019 0946   LDLCALC 50 01/04/2021 1123   LDLCALC 44 01/27/2020  1022    Other Studies Reviewed Today:  ABI's 08/18/20 Summary:  Right: Resting  right ankle-brachial index is within normal range. No  evidence of significant right lower extremity arterial disease. The right  toe-brachial index is normal.   Left: Resting left ankle-brachial index is within normal range. No  evidence of significant left lower extremity arterial disease. The left  toe-brachial index is normal.    Cardiac Catheterization: 04/2019 40% eccentric mid LAD in-stent restenosis.  At completion of stent procedure in September 2020, there was residual stent deformity in this region.  There is no high-grade obstruction in the LAD stent.  The LAD is otherwise normal. Normal left main Normal circumflex Normal RCA Normal LV systolic function and end-diastolic pressure   RECOMMENDATIONS:   Dyspnea cannot be explained by findings on today's study based on absence of significant coronary obstruction and normal LV hemodynamics. Continue dual antiplatelet therapy beyond 12 months due to overlapping stents in the LAD.   Echocardiogram: 06/2019 IMPRESSIONS     1. Left ventricular ejection fraction, by visual estimation, is 55 to  60%. The left ventricle has normal function. There is no left ventricular  hypertrophy.   2. Left ventricular diastolic Doppler parameters are consistent with  impaired relaxation pattern of LV diastolic filling.   3. Global right ventricle has normal systolic function.The right  ventricular size is normal. No increase in right ventricular wall  thickness.   4. Left atrial size was normal.   5. Right atrial size was normal.   6. The mitral valve is normal in structure. Trace mitral valve  regurgitation. No evidence of mitral stenosis.   7. The tricuspid valve is normal in structure. Tricuspid valve  regurgitation was not visualized by color flow Doppler.   8. The aortic valve is tricuspid Aortic valve regurgitation was not  visualized by color flow  Doppler. Mild aortic valve sclerosis without  stenosis.   9. The pulmonic valve was not well visualized. Pulmonic valve  regurgitation is not visualized by color flow Doppler.  10. Normal pulmonary artery systolic pressure.  11. The inferior vena cava IVC is small, suggesting low RA pressure and  hypovolemia.    Long-term Monitor: 01/2020 Sinus rhythm with rare PACs and PVCs and short bursts of SVT (possibly atrial tachycardia).     Assessment and Plan:  1. CAD in native artery   2. Dyspnea on exertion   3. Essential hypertension   4. Other hyperlipidemia   5. Gastroesophageal reflux disease, unspecified whether esophagitis present    1. CAD in native artery Denies any current anginal symptoms.  Denies any nitroglycerin use.  Continue sublingual nitroglycerin as needed, aspirin 81 mg daily,Plavix 75 mg daily,  2. Dyspnea on exertion Complaining of recent worsening of dyspnea on exertion.  We discussed a follow-up echocardiogram to reassess LV function, diastolic function and valvular function.  Please get a follow-up echocardiogram.  3. Essential hypertension Blood pressure improved since increasing clonidine at last visit.  BP today 138/80.  Continue clonidine 0.3 mg p.o. daily, amlodipine 10 mg daily.  Toprol-XL 12.5 mg p.o. daily.  4. Other hyperlipidemia Continue Crestor 40 mg p.o. daily.  Lipid panel on 01/04/2021 TC 130, TG 63, HDL 67, LDL 50.  5. Gastroesophageal reflux disease, unspecified whether esophagitis present History of GERD plus H. pylori.  She is continuing omeprazole 40 mg daily.   Medication Adjustments/Labs and Tests Ordered: Current medicines are reviewed at length with the patient today.  Concerns regarding medicines are outlined above.   Disposition: Follow-up with Dr. [name] or APP 6 months  Signed, Levell July,  NP 08/03/2021 10:06 AM    [facility], [place], [place] [address] Phone: [phone]; Fax:  [phone]

## 2021-08-03 ENCOUNTER — Encounter: Payer: Self-pay | Admitting: Family Medicine

## 2021-08-03 ENCOUNTER — Other Ambulatory Visit: Payer: Self-pay | Admitting: Family Medicine

## 2021-08-03 ENCOUNTER — Ambulatory Visit: Payer: Medicare Other | Admitting: Family Medicine

## 2021-08-03 VITALS — BP 138/80 | HR 70 | Ht 64.0 in | Wt 137.2 lb

## 2021-08-03 DIAGNOSIS — R0609 Other forms of dyspnea: Secondary | ICD-10-CM

## 2021-08-03 DIAGNOSIS — I10 Essential (primary) hypertension: Secondary | ICD-10-CM

## 2021-08-03 DIAGNOSIS — E7849 Other hyperlipidemia: Secondary | ICD-10-CM

## 2021-08-03 DIAGNOSIS — K219 Gastro-esophageal reflux disease without esophagitis: Secondary | ICD-10-CM

## 2021-08-03 DIAGNOSIS — I2510 Atherosclerotic heart disease of native coronary artery without angina pectoris: Secondary | ICD-10-CM | POA: Diagnosis not present

## 2021-08-03 NOTE — Patient Instructions (Signed)

## 2021-08-10 ENCOUNTER — Ambulatory Visit (HOSPITAL_COMMUNITY)
Admission: RE | Admit: 2021-08-10 | Discharge: 2021-08-10 | Disposition: A | Discharge status: Home / Self Care | Payer: Medicare Other | Source: Ambulatory Visit | Attending: Family Medicine | Admitting: Family Medicine

## 2021-08-10 ENCOUNTER — Encounter: Payer: Self-pay | Admitting: Family Medicine

## 2021-08-10 ENCOUNTER — Ambulatory Visit (INDEPENDENT_AMBULATORY_CARE_PROVIDER_SITE_OTHER): Payer: Medicare Other | Admitting: Family Medicine

## 2021-08-10 ENCOUNTER — Other Ambulatory Visit: Payer: Self-pay

## 2021-08-10 VITALS — BP 139/80 | HR 74 | Resp 17 | Ht 64.0 in | Wt 135.1 lb

## 2021-08-10 DIAGNOSIS — E559 Vitamin D deficiency, unspecified: Secondary | ICD-10-CM | POA: Diagnosis not present

## 2021-08-10 DIAGNOSIS — E7849 Other hyperlipidemia: Secondary | ICD-10-CM | POA: Diagnosis not present

## 2021-08-10 DIAGNOSIS — I10 Essential (primary) hypertension: Secondary | ICD-10-CM

## 2021-08-10 DIAGNOSIS — R7302 Impaired glucose tolerance (oral): Secondary | ICD-10-CM

## 2021-08-10 DIAGNOSIS — R4589 Other symptoms and signs involving emotional state: Secondary | ICD-10-CM

## 2021-08-10 DIAGNOSIS — F418 Other specified anxiety disorders: Secondary | ICD-10-CM

## 2021-08-10 DIAGNOSIS — D126 Benign neoplasm of colon, unspecified: Secondary | ICD-10-CM

## 2021-08-10 DIAGNOSIS — M542 Cervicalgia: Secondary | ICD-10-CM | POA: Insufficient documentation

## 2021-08-10 DIAGNOSIS — Z23 Encounter for immunization: Secondary | ICD-10-CM

## 2021-08-10 NOTE — Patient Instructions (Addendum)
Annual exam in office with mD in January early ( ensure due) call if you need me sooner. MMSE at this visit  Please get X ray of neck today, I believe you have arthritis  Flu vaccine today ' Please get covid vaccine at your pharmacy , past due   Please get 1 week before next  visit fasting CBC, lipid, cmp and EGFr, TSH and vit D  It is important that you exercise regularly at least 30 minutes 5 times a week. If you develop chest pain, have severe difficulty breathing, or feel very tired, stop exercising immediately and seek medical attention  Thanks for choosing [facility], we consider it a privelige to serve you.

## 2021-08-13 ENCOUNTER — Encounter: Payer: Self-pay | Admitting: Family Medicine

## 2021-08-13 DIAGNOSIS — M542 Cervicalgia: Secondary | ICD-10-CM | POA: Insufficient documentation

## 2021-08-13 NOTE — Assessment & Plan Note (Signed)
Still needs to follow through on GI f consult, will call to remind

## 2021-08-13 NOTE — Assessment & Plan Note (Signed)
Hyperlipidemia:Low fat diet discussed and encouraged.   Lipid Panel  Lab Results  Component Value Date   CHOL 130 01/04/2021   HDL 67 01/04/2021   LDLCALC 50 01/04/2021   TRIG 63 01/04/2021   CHOLHDL 1.9 01/04/2021    Controlled, no change in medication Updated lab needed at/ before next visit.

## 2021-08-13 NOTE — Assessment & Plan Note (Signed)
History and exam c/w arthritis, update imaging

## 2021-08-13 NOTE — Progress Notes (Signed)
   Tanya [name]     MRN: [id number]      DOB: [birth year]   HPI Tanya [name] is here for follow up and re-evaluation of chronic medical conditions, medication management and review of any available recent lab and radiology data.  Preventive health is updated, specifically  Cancer screening and Immunization.   Questions or concerns regarding consultations or procedures which the PT has had in the interim are  addressed. The PT denies any adverse reactions to current medications since the last visit.  C/o bruising, notes bruise on thigh, currnetly on plavix  ROS Denies recent fever or chills. Denies sinus pressure, nasal congestion, ear pain or sore throat. Denies chest congestion, productive cough or wheezing. Denies chest pains, palpitations and leg swelling Denies abdominal pain, nausea, vomiting,diarrhea or constipation.   Denies dysuria, frequency, hesitancy or incontinence. . Denies headaches, seizures, numbness, or tingling. Denies depression, anxiety or insomnia. Denies skin break down or rash.   PE  BP 139/80   Pulse 74   Resp 17   Ht 5\' 4"  (1.626 m)   Wt 135 lb 1.9 oz (61.3 kg)   SpO2 97%   BMI 23.19 kg/m   Patient alert and oriented and in no cardiopulmonary distress.  HEENT: No facial asymmetry, EOMI,     Neck decreased rOM.  Chest: Clear to auscultation bilaterally.  CVS: S1, S2 no murmurs, no S3.Regular rate.  ABD: Soft non tender.   Ext: No edema  MS: Adequate ROM spine, shoulders, hips and knees.  Skin: Intact, no ulcerations or rash noted.  Psych: Good eye contact, normal affect. Memory intact  anxious or depressed appearing.  CNS: CN 2-12 intact, power,  normal throughout.no focal deficits noted.   Assessment & Plan  Neck pain, bilateral History and exam c/w arthritis, update imaging  Tubular adenoma of colon Still needs to follow through on GI f consult, will call to remind  Hyperlipemia Hyperlipidemia:Low fat diet discussed and  encouraged.   Lipid Panel  Lab Results  Component Value Date   CHOL 130 01/04/2021   HDL 67 01/04/2021   LDLCALC 50 01/04/2021   TRIG 63 01/04/2021   CHOLHDL 1.9 01/04/2021    Controlled, no change in medication Updated lab needed at/ before next visit.    Anxiety about health Single bruise on thigh, CBC within normal and on plavix, reassured that bruise likely due to this

## 2021-08-13 NOTE — Assessment & Plan Note (Signed)
DASH diet and commitment to daily physical activity for a minimum of 30 minutes discussed and encouraged, as a part of hypertension management. The importance of attaining a healthy weight is also discussed.  BP/Weight 08/10/2021 08/03/2021 05/22/2021 05/15/2021 04/19/2021 04/11/1974 05/08/3253  Systolic BP 982 641 583 094 076 808 811  Diastolic BP 80 80 83 78 75 69 77  Wt. (Lbs) 135.12 137.2 131 132.5 131 133 132.03  BMI 23.19 23.55 22.49 23.47 22.49 22.83 22.66   Sub optimal control, no med change at visit

## 2021-08-13 NOTE — Assessment & Plan Note (Signed)
Single bruise on thigh, CBC within normal and on plavix, reassured that bruise likely due to this

## 2021-08-21 ENCOUNTER — Telehealth: Payer: Self-pay | Admitting: Family Medicine

## 2021-08-21 MED ORDER — METOPROLOL SUCCINATE ER 25 MG PO TB24: 12.5000 mg | ORAL_TABLET | Freq: Every day | ORAL | 3 refills | Status: DC

## 2021-08-21 NOTE — Telephone Encounter (Signed)
Patient needs refill on Metoprolol SUCC ER TAB 25MG  sent to [facility] in [place].

## 2021-08-31 ENCOUNTER — Other Ambulatory Visit: Payer: Self-pay | Admitting: Family Medicine

## 2021-09-11 ENCOUNTER — Telehealth: Payer: Self-pay | Admitting: *Deleted

## 2021-09-11 NOTE — Telephone Encounter (Signed)
Aimovig PA, key [id number], [id number]. Your information has been sent to [facility]

## 2021-09-11 NOTE — Telephone Encounter (Signed)
AIMOVIG INJ 70MG /ML, use as directed, is approved through 09/30/2022 under your Medicare Part D. Approval letter faxed to pharmacy.

## 2021-09-15 ENCOUNTER — Ambulatory Visit (HOSPITAL_COMMUNITY)
Admission: RE | Admit: 2021-09-15 | Discharge: 2021-09-15 | Disposition: A | Discharge status: Home / Self Care | Payer: Medicare Other | Source: Ambulatory Visit | Attending: Family Medicine | Admitting: Family Medicine

## 2021-09-15 DIAGNOSIS — R0609 Other forms of dyspnea: Secondary | ICD-10-CM | POA: Insufficient documentation

## 2021-09-15 DIAGNOSIS — I2510 Atherosclerotic heart disease of native coronary artery without angina pectoris: Secondary | ICD-10-CM | POA: Diagnosis not present

## 2021-09-15 LAB — ECHOCARDIOGRAM COMPLETE
Area-P 1/2: 2.99 cm2
S' Lateral: 2.6 cm

## 2021-09-15 NOTE — Progress Notes (Signed)
*  PRELIMINARY RESULTS* Echocardiogram 2D Echocardiogram has been performed.  Tanya [name] 09/15/2021, 10:09 AM

## 2021-09-20 ENCOUNTER — Other Ambulatory Visit: Payer: Self-pay

## 2021-09-20 ENCOUNTER — Telehealth: Payer: Self-pay | Admitting: Family Medicine

## 2021-09-20 MED ORDER — POTASSIUM CHLORIDE ER 20 MEQ PO TBCR: 1.0000 | EXTENDED_RELEASE_TABLET | Freq: Every day | ORAL | 3 refills | Status: DC

## 2021-09-20 NOTE — Telephone Encounter (Signed)
Pt needs refill on  POTASSIUM   [facility] [place]

## 2021-09-20 NOTE — Telephone Encounter (Signed)
Refill sent.

## 2021-10-11 ENCOUNTER — Other Ambulatory Visit: Payer: Self-pay | Admitting: Family Medicine

## 2021-10-22 ENCOUNTER — Other Ambulatory Visit: Payer: Self-pay | Admitting: Cardiology

## 2021-10-23 ENCOUNTER — Telehealth: Payer: Self-pay | Admitting: Family Medicine

## 2021-10-23 NOTE — Telephone Encounter (Signed)
Please call in Paroxetime she is requesting a 90 day supply   Called in to [facility] in [place]

## 2021-10-24 ENCOUNTER — Other Ambulatory Visit: Payer: Self-pay

## 2021-10-24 MED ORDER — PAROXETINE HCL 10 MG PO TABS: 10.0000 mg | ORAL_TABLET | Freq: Every day | ORAL | 1 refills | Status: DC

## 2021-10-24 NOTE — Telephone Encounter (Signed)
Sent it pharmacy

## 2021-10-30 DIAGNOSIS — E7849 Other hyperlipidemia: Secondary | ICD-10-CM | POA: Diagnosis not present

## 2021-10-30 DIAGNOSIS — E559 Vitamin D deficiency, unspecified: Secondary | ICD-10-CM | POA: Diagnosis not present

## 2021-10-30 DIAGNOSIS — I10 Essential (primary) hypertension: Secondary | ICD-10-CM | POA: Diagnosis not present

## 2021-10-31 ENCOUNTER — Encounter: Payer: Self-pay | Admitting: Diagnostic Neuroimaging

## 2021-10-31 ENCOUNTER — Other Ambulatory Visit (HOSPITAL_COMMUNITY): Payer: Self-pay | Admitting: Interventional Radiology

## 2021-10-31 ENCOUNTER — Other Ambulatory Visit: Payer: Self-pay

## 2021-10-31 ENCOUNTER — Ambulatory Visit (INDEPENDENT_AMBULATORY_CARE_PROVIDER_SITE_OTHER): Payer: Medicare Other | Admitting: Diagnostic Neuroimaging

## 2021-10-31 VITALS — BP 128/62 | HR 67 | Ht 64.0 in | Wt 136.8 lb

## 2021-10-31 DIAGNOSIS — I671 Cerebral aneurysm, nonruptured: Secondary | ICD-10-CM

## 2021-10-31 DIAGNOSIS — G43009 Migraine without aura, not intractable, without status migrainosus: Secondary | ICD-10-CM | POA: Diagnosis not present

## 2021-10-31 DIAGNOSIS — R413 Other amnesia: Secondary | ICD-10-CM | POA: Diagnosis not present

## 2021-10-31 LAB — CBC
Hematocrit: 38 % (ref 34.0–46.6)
Hemoglobin: 12.7 g/dL (ref 11.1–15.9)
MCH: 31.5 pg (ref 26.6–33.0)
MCHC: 33.4 g/dL (ref 31.5–35.7)
MCV: 94 fL (ref 79–97)
Platelets: 297 10*3/uL (ref 150–450)
RBC: 4.03 x10E6/uL (ref 3.77–5.28)
RDW: 12.8 % (ref 11.7–15.4)
WBC: 3.8 10*3/uL (ref 3.4–10.8)

## 2021-10-31 LAB — LIPID PANEL
Chol/HDL Ratio: 1.9 ratio (ref 0.0–4.4)
Cholesterol, Total: 120 mg/dL (ref 100–199)
HDL: 62 mg/dL (ref >39)
LDL Chol Calc (NIH): 46 mg/dL (ref 0–99)
Triglycerides: 49 mg/dL (ref 0–149)
VLDL Cholesterol Cal: 12 mg/dL (ref 5–40)

## 2021-10-31 LAB — TSH: TSH: 3.53 u[IU]/mL (ref 0.450–4.500)

## 2021-10-31 LAB — VITAMIN D 25 HYDROXY (VIT D DEFICIENCY, FRACTURES): Vit D, 25-Hydroxy: 38.4 ng/mL (ref 30.0–100.0)

## 2021-10-31 MED ORDER — TOPIRAMATE 50 MG PO TABS: 50.0000 mg | ORAL_TABLET | Freq: Every day | ORAL | 4 refills | Status: DC

## 2021-10-31 NOTE — Patient Instructions (Addendum)
Tanya Bras, MD - aneurysm follow up  Specialties: Interventional Radiology, Radiology Why: Please follow-up with Dr. Estanislado Pandy in clinic for aneurysm follow up. Contact information: [address] [place] [address] [phone]  MIGRAINE HEADACHES - continue topiramate 50mg  at bedtime; effective, only 1-2 headaches per year

## 2021-10-31 NOTE — Progress Notes (Signed)
[facility]  PATIENT: Tanya [name] DOB: [birth year]  REFERRING CLINICIAN: [name], MD HISTORY FROM: patient and husband REASON FOR VISIT: follow up   HISTORICAL  CHIEF COMPLAINT:  Chief Complaint  Patient presents with   Migraine    Rm 6, 6 month FU husband- Richard   "did not get AImovig or Nurtec, too expensive, wants topamax"    HISTORY OF PRESENT ILLNESS:   UPDATE (10/30/21, VRP): Since last visit, doing well. Only rare headaches while on topiramate 50mg  at bedtime. Tried aimovig and nurtec, but not that much better, and also expensive.   PRIOR HPI (04/19/21, VRP): 71 year old female here for evaluation of headaches.  Patient has had headaches since age 67 years old with global severe throbbing headaches, nausea vomiting, sensitive to light and sound.  No visual scotoma or aura.  Headaches were intermittent throughout her life.  Headaches have worsened in the last 1 to 2 years.  Patient now on topiramate which has slightly helped.  September 2020 patient had ER evaluation for chest pain, headache, numbness.  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  She was referred to neurologist Dr. Merlene Laughter.  He referred patient to Dr. Estanislado Pandy for aneurysm evaluation.  Although aneurysm was 3 mm in size and not felt to be symptomatic, this was treated with endovascular embolization.  Patient has been on topiramate for past 1 year for migraine treatment.  This is slightly helped symptoms but patient continues to have almost daily headaches.  Patient feeling more headaches on the right side.  Also complaining of memory loss issues.  Patient is very hard of hearing and does not use her hearing aids.  She maintains all her ADLs.  She still works in a [employer] certified [employer] and enjoys this work.  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  Patient has been having issues with anxiety  and depression on medication.  Has also had problems with insomnia in the past.    REVIEW OF SYSTEMS: Full 14 system review of systems performed and negative with exception of: as per HPI.  ALLERGIES: No Known Allergies  HOME MEDICATIONS: Outpatient Medications Prior to Visit  Medication Sig Dispense Refill   acetaminophen (TYLENOL) 500 MG tablet Take 1,000 mg by mouth every 6 (six) hours as needed for moderate pain.     amLODipine (NORVASC) 10 MG tablet Take 1 tablet (10 mg total) by mouth daily. 30 tablet 5   Ascorbic Acid (VITAMIN C) 1000 MG tablet Take 1,000 mg by mouth daily.     aspirin EC 81 MG tablet Take 81 mg by mouth daily. Swallow whole.     cholecalciferol (VITAMIN D3) 25 MCG (1000 UNIT) tablet Take 1,000 Units by mouth daily.     cloNIDine (CATAPRES) 0.3 MG tablet Take 0.3 mg by mouth at bedtime.     clopidogrel (PLAVIX) 75 MG tablet TAKE 1 TABLET BY MOUTH  DAILY 90 tablet 3   metoprolol succinate (TOPROL-XL) 25 MG 24 hr tablet TAKE ONE-HALF TABLET BY  MOUTH DAILY 45 tablet 3   nitroGLYCERIN (NITROSTAT) 0.4 MG SL tablet Place 1 tablet (0.4 mg total) under the tongue every 5 (five) minutes as needed. 25 tablet 3   PARoxetine (PAXIL) 10 MG tablet Take 1 tablet (10 mg total) by mouth daily. 90 tablet 1   Potassium Chloride ER 20 MEQ TBCR Take 1 tablet by mouth daily. Brooks  tablet 3   rosuvastatin (CRESTOR) 40 MG tablet TAKE 1 TABLET BY MOUTH  DAILY 90 tablet 3   temazepam (RESTORIL) 15 MG capsule TAKE 1 CAPSULE BY MOUTH AT BEDTIME AS NEEDED FOR SLEEP 30 capsule 5   topiramate (TOPAMAX) 25 MG capsule Take 50 mg by mouth daily.     omeprazole (PRILOSEC) 40 MG capsule Take 1 capsule (40 mg total) by mouth daily. (Patient not taking: Reported on 10/31/2021) 90 capsule 3   prednisoLONE acetate (PRED FORTE) 1 % ophthalmic suspension prednisolone acetate 1 % eye drops,suspension  INSTILL 1 DROP INTO RIGHT EYE EVERY 8 HOURS SHAKE WELL (Patient not taking: Reported on 10/31/2021)      Erenumab-aooe (AIMOVIG) 70 MG/ML SOAJ Inject 70 mg into the skin every 30 (thirty) days. (Patient not taking: Reported on 10/31/2021) 4.5 mL 4   Rimegepant Sulfate (NURTEC) 75 MG TBDP Take 75 mg by mouth daily as needed. (Patient not taking: Reported on 08/03/2021) 8 tablet 6   Simethicone 80 MG TABS Take 1 tablet (80 mg total) by mouth 2 (two) times daily as needed. (Patient not taking: No sig reported) 60 tablet 1   No facility-administered medications prior to visit.    PAST MEDICAL HISTORY: Past Medical History:  Diagnosis Date   Aneurysm ([place])    left ICA 09/2019   Anhedonia    Anxiety    Brain aneurysm    CAD (coronary artery disease)    a. s/p DESx2 to proximal LAD in 06/2018 b. cath in 04/2019 showing 40% mid-LAD ISR with no significant stenosis along LM, LCx, or RCA   Cystitis    Depression    Elevated liver enzymes    Fatty liver    GERD (gastroesophageal reflux disease)    Hard of hearing    Headache    migraine   Hyperlipidemia    Hypertension    Insomnia    Migraine    Osteoarthritis    Unspecified hypothyroidism    Vaginitis    Wears glasses    Wears hearing aid    B/L    PAST SURGICAL HISTORY: Past Surgical History:  Procedure Laterality Date   BIOPSY N/A 01/18/2021   Procedure: BIOPSY;  Surgeon: Harvel Quale, MD;  [facility];  Service: Gastroenterology;  Laterality: N/A;   CATARACT EXTRACTION W/PHACO Right 05/08/2019   Procedure: CATARACT EXTRACTION PHACO AND INTRAOCULAR LENS PLACEMENT (IOC);  Surgeon: Baruch Goldmann, MD;  Location: [facility];  Service: Ophthalmology;  Laterality: Right;  CDE: 10.02   COLONOSCOPY N/A 02/08/2016   Procedure: COLONOSCOPY;  Surgeon: [name], MD;  [facility]: [facility];  Service: Endoscopy;  Laterality: N/A;  1200 - moved to 11:45 - Ann to notify   CORONARY STENT INTERVENTION N/A 06/06/2018   Procedure: CORONARY STENT INTERVENTION;  Surgeon: [name], [name], MD;  Location: [place] [facility];   Service: Cardiovascular;  Laterality: N/A;   ESOPHAGOGASTRODUODENOSCOPY (EGD) WITH PROPOFOL N/A 01/18/2021   Procedure: ESOPHAGOGASTRODUODENOSCOPY (EGD) WITH PROPOFOL;  Surgeon: Harvel Quale, MD;  [facility];  Service: Gastroenterology;  Laterality: N/A;  10:30AM   INTRAVASCULAR PRESSURE WIRE/FFR STUDY N/A 06/06/2018   Procedure: INTRAVASCULAR PRESSURE WIRE/FFR STUDY;  Surgeon: [name], [name], MD;  Location: [place] [facility];  Service: Cardiovascular;  Laterality: N/A;   IR ANGIO INTRA EXTRACRAN SEL COM CAROTID INNOMINATE BILAT MOD SED  09/01/2019   IR ANGIO INTRA EXTRACRAN SEL INTERNAL CAROTID UNI L MOD SED  12/21/2019   IR ANGIO  VERTEBRAL SEL VERTEBRAL BILAT MOD SED  09/01/2019   IR ANGIOGRAM FOLLOW UP STUDY  12/21/2019   IR TRANSCATH/EMBOLIZ  12/21/2019   LEFT HEART CATH AND CORONARY ANGIOGRAPHY N/A 04/24/2019   Procedure: LEFT HEART CATH AND CORONARY ANGIOGRAPHY;  Surgeon: Belva Crome, MD;  Location: [place] [facility];  Service: Cardiovascular;  Laterality: N/A;   LEFT HEART CATHETERIZATION WITH CORONARY ANGIOGRAM N/A 11/19/2014   Procedure: LEFT HEART CATHETERIZATION WITH CORONARY ANGIOGRAM;  Surgeon: [name], MD;  Location: [facility];  Service: Cardiovascular;  Laterality: N/A;   RADIOLOGY WITH ANESTHESIA N/A 11/23/2019   Procedure: Drucilla Schmidt;  Surgeon: Luanne Bras, MD;  Location: [place];  Service: Radiology;  Laterality: N/A;   RADIOLOGY WITH ANESTHESIA N/A 12/21/2019   Procedure: EMBOLIZATION;  Surgeon: Luanne Bras, MD;  Location: [place];  Service: Radiology;  Laterality: N/A;   RIGHT/LEFT HEART CATH AND CORONARY ANGIOGRAPHY N/A 06/06/2018   Procedure: RIGHT/LEFT HEART CATH AND CORONARY ANGIOGRAPHY;  Surgeon: [name], [name], MD;  Location: [place] [facility];  Service: Cardiovascular;  Laterality: N/A;    FAMILY HISTORY: Family History  Problem Relation Age of Onset   Dementia Mother    Heart disease Mother        heart attack     Hypertension Mother    Heart attack Father    Heart disease Father        heart attack    Hypertension Father     SOCIAL HISTORY: Social History   Socioeconomic History   Marital status: Married    Spouse name: Richard   Number of children: 0   Years of education: 12   Highest education level: Not on file  Occupational History   Not on file  Tobacco Use   Smoking status: Never   Smokeless tobacco: Never  Vaping Use   Vaping Use: Never used  Substance and Sexual Activity   Alcohol use: Never   Drug use: Never   Sexual activity: Not on file  Other Topics Concern   Not on file  Social History Narrative   Pt originally from the [place] been in [place] for 20 years  ,lives with husband   Caffeine coffee 1 c daily    Social Determinants of Health   Financial Resource Strain: Low Risk    Difficulty of Paying Living Expenses: Not hard at all  Food Insecurity: No Food Insecurity   Worried About Charity fundraiser in the Last Year: Never true   Arboriculturist in the Last Year: Never true  Transportation Needs: No Transportation Needs   Lack of Transportation (Medical): No   Lack of Transportation (Non-Medical): No  Physical Activity: Sufficiently Active   Days of Exercise per Week: 7 days   Minutes of Exercise per Session: 30 min  Stress: No Stress Concern Present   Feeling of Stress : Not at all  Social Connections: Moderately Isolated   Frequency of Communication with Friends and Family: Three times a week   Frequency of Social Gatherings with Friends and Family: Once a week   Attends Religious Services: Never   Marine scientist or Organizations: No   Attends Music therapist: Never   Marital Status: Married  Human resources officer Violence: Not At Risk   Fear of Current or Ex-Partner: No   Emotionally Abused: No   Physically Abused: No   Sexually Abused: No     PHYSICAL EXAM  GENERAL EXAM/CONSTITUTIONAL: Vitals:  Vitals:  10/31/21 1020  BP:  128/62  Pulse: 67  Weight: 136 lb 12.8 oz (62.1 kg)  Height: 5\' 4"  (1.626 m)    Body mass index is 23.48 kg/m. Wt Readings from Last 3 Encounters:  10/31/21 136 lb 12.8 oz (62.1 kg)  08/10/21 135 lb 1.9 oz (61.3 kg)  08/03/21 137 lb 3.2 oz (62.2 kg)   Patient is in no distress; well developed, nourished and groomed; neck is supple  CARDIOVASCULAR: Examination of carotid arteries is normal; no carotid bruits Regular rate and rhythm, no murmurs Examination of peripheral vascular system by observation and palpation is normal  EYES: Ophthalmoscopic exam of optic discs and posterior segments is normal; no papilledema or hemorrhages No results found.  MUSCULOSKELETAL: Gait, strength, tone, movements noted in Neurologic exam below  NEUROLOGIC: MENTAL STATUS:  MMSE - Mini Mental State Exam 10/31/2021 04/19/2021  Not completed: Unable to complete -  Orientation to time 2 4  Orientation to Place 3 3  Registration - 2  Attention/ Calculation - 0  Recall - 0  Language- name 2 objects - 2  Language- repeat - 0  Language- follow 3 step command - 3  Language- read & follow direction - 1  Write a sentence - 1  Copy design - 0  Total score - 16   awake, alert, oriented to person, place and time recent and remote memory intact normal attention and concentration language fluent, comprehension intact, naming intact fund of knowledge appropriate  CRANIAL NERVE:  2nd - no papilledema on fundoscopic exam 2nd, 3rd, 4th, 6th - pupils equal and reactive to light, visual fields full to confrontation, extraocular muscles intact, no nystagmus 5th - facial sensation symmetric 7th - facial strength symmetric 8th - hearing intact 9th - palate elevates symmetrically, uvula midline 11th - shoulder shrug symmetric 12th - tongue protrusion midline  MOTOR:  normal bulk and tone, full strength in the BUE, BLE  SENSORY:  normal and symmetric to light touch, temperature,  vibration  COORDINATION:  finger-nose-finger, fine finger movements normal  REFLEXES:  deep tendon reflexes present and symmetric  GAIT/STATION:  narrow based gait     DIAGNOSTIC DATA (LABS, IMAGING, TESTING) - I reviewed patient records, labs, notes, testing and imaging myself where available.  Lab Results  Component Value Date   WBC 3.8 10/30/2021   HGB 12.7 10/30/2021   HCT 38.0 10/30/2021   MCV 94 10/30/2021   PLT 297 10/30/2021      Component Value Date/Time   NA 143 01/04/2021 1123   K 4.6 01/04/2021 1123   CL 108 (H) 01/04/2021 1123   CO2 20 01/04/2021 1123   GLUCOSE 98 01/04/2021 1123   GLUCOSE 101 (H) 03/28/2020 1323   BUN 14 01/04/2021 1123   CREATININE 0.60 02/15/2021 0901   CREATININE 0.68 10/21/2019 1019   CALCIUM 9.8 01/04/2021 1123   PROT 7.2 01/04/2021 1123   ALBUMIN 4.7 01/04/2021 1123   AST 24 01/04/2021 1123   ALT 14 01/04/2021 1123   ALKPHOS 62 01/04/2021 1123   BILITOT 0.4 01/04/2021 1123   GFRNONAA >60 03/28/2020 1323   GFRNONAA 89 10/21/2019 1019   GFRAA >60 03/28/2020 1323   GFRAA 103 10/21/2019 1019   Lab Results  Component Value Date   CHOL 120 10/30/2021   HDL 62 10/30/2021   LDLCALC 46 10/30/2021   TRIG 49 10/30/2021   CHOLHDL 1.9 10/30/2021   Lab Results  Component Value Date   HGBA1C 6.1 (H) 06/29/2019   No results found  for: [facility] Lab Results  Component Value Date   TSH 3.530 10/30/2021    12/21/19  Status post endovascular embolization of 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  02/10/21 MRI brain No evidence of acute intracranial abnormality.   Minimal chronic small-vessel ischemic changes within the cerebral white matter, slightly progressed from the brain MRI of 06/29/2019.   Nonspecific chronic microhemorrhage within the anterior left frontal lobe, new from the prior MRI.    ASSESSMENT AND PLAN  71 y.o. year old female here  with:  Dx:  1. Migraine without aura and without status migrainosus, not intractable   2. 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 Juliet Rude  Meds ordered this encounter  Medications   topiramate (TOPAMAX) 50 MG tablet    Sig: Take 1 tablet (50 mg total) by mouth at bedtime.    Dispense:  90 tablet    Refill:  4   Return for return to PCP, pending if symptoms worsen or fail to improve.    Penni Bombard, MD 5/36/6440, 34:74 AM Certified in Neurology, Neurophysiology and Neuroimaging  [facility] [address], [place], [place] [address] [phone]

## 2021-11-10 ENCOUNTER — Ambulatory Visit (INDEPENDENT_AMBULATORY_CARE_PROVIDER_SITE_OTHER): Payer: Medicare Other | Admitting: Family Medicine

## 2021-11-10 ENCOUNTER — Ambulatory Visit (HOSPITAL_COMMUNITY): Admission: RE | Admit: 2021-11-10 | Payer: Medicare Other | Source: Ambulatory Visit

## 2021-11-10 ENCOUNTER — Encounter: Payer: Self-pay | Admitting: Family Medicine

## 2021-11-10 ENCOUNTER — Other Ambulatory Visit: Payer: Self-pay

## 2021-11-10 ENCOUNTER — Telehealth (HOSPITAL_COMMUNITY): Payer: Self-pay

## 2021-11-10 VITALS — BP 120/69 | HR 68 | Ht 64.0 in | Wt 137.1 lb

## 2021-11-10 DIAGNOSIS — I10 Essential (primary) hypertension: Secondary | ICD-10-CM

## 2021-11-10 DIAGNOSIS — Z1231 Encounter for screening mammogram for malignant neoplasm of breast: Secondary | ICD-10-CM

## 2021-11-10 DIAGNOSIS — Z0000 Encounter for general adult medical examination without abnormal findings: Secondary | ICD-10-CM

## 2021-11-10 MED ORDER — TEMAZEPAM 15 MG PO CAPS: 15.0000 mg | ORAL_CAPSULE | Freq: Every evening | ORAL | 5 refills | Status: DC | PRN

## 2021-11-10 NOTE — Telephone Encounter (Signed)
Spoke with pt's husband, per Dr. Estanislado Pandy, we will do the mri first before scheduling a consult. He was fine with this plan. AW

## 2021-11-10 NOTE — Assessment & Plan Note (Signed)

## 2021-11-10 NOTE — Patient Instructions (Signed)
F/U in September, flu vaccine at visit , call if you need me sooner  Annual exam in 1 year  cMP and eGFR today  Recent labs are excellent  Please schedule mammogram at checkout  Colonoscopy due next year  Thanks for choosing [facility], we consider it a privelige to serve you.

## 2021-11-11 LAB — CMP14+EGFR
ALT: 16 IU/L (ref 0–32)
AST: 19 IU/L (ref 0–40)
Albumin/Globulin Ratio: 1.8 (ref 1.2–2.2)
Albumin: 4.4 g/dL (ref 3.7–4.7)
Alkaline Phosphatase: 71 IU/L (ref 44–121)
BUN/Creatinine Ratio: 26 (ref 12–28)
BUN: 15 mg/dL (ref 8–27)
Bilirubin Total: 0.3 mg/dL (ref 0.0–1.2)
CO2: 22 mmol/L (ref 20–29)
Calcium: 9.3 mg/dL (ref 8.7–10.3)
Chloride: 108 mmol/L — ABNORMAL HIGH (ref 96–106)
Creatinine, Ser: 0.58 mg/dL (ref 0.57–1.00)
Globulin, Total: 2.4 g/dL (ref 1.5–4.5)
Glucose: 71 mg/dL (ref 70–99)
Potassium: 4.1 mmol/L (ref 3.5–5.2)
Sodium: 142 mmol/L (ref 134–144)
Total Protein: 6.8 g/dL (ref 6.0–8.5)
eGFR: 96 mL/min/{1.73_m2} (ref >59)

## 2021-11-13 ENCOUNTER — Encounter: Payer: Self-pay | Admitting: Family Medicine

## 2021-11-13 ENCOUNTER — Other Ambulatory Visit (HOSPITAL_COMMUNITY): Payer: Self-pay | Admitting: Interventional Radiology

## 2021-11-13 DIAGNOSIS — I671 Cerebral aneurysm, nonruptured: Secondary | ICD-10-CM

## 2021-11-13 DIAGNOSIS — R519 Headache, unspecified: Secondary | ICD-10-CM

## 2021-11-13 NOTE — Progress Notes (Signed)
° ° °  Tanya [name]     MRN: [id number]      DOB: Nov 20, 1950  HPI: Patient is in for annual physical exam. No other health concerns are expressed or addressed at the visit. Recent labs,  are reviewed. Immunization is reviewed , and  updated if needed.   PE: BP 120/69    Pulse 68    Ht 5\' 4"  (1.626 m)    Wt 137 lb 1.9 oz (62.2 kg)    SpO2 98%    BMI 23.54 kg/m   Pleasant  female, alert and oriented x 3, in no cardio-pulmonary distress. Afebrile. HEENT No facial trauma or asymetry. Sinuses non tender.  Extra occullar muscles intact.. External ears normal, . Neck: supple, no adenopathy,JVD or thyromegaly.No bruits.  Chest: Clear to ascultation bilaterally.No crackles or wheezes. Non tender to palpation    Cardiovascular system; Heart sounds normal,  S1 and  S2 ,no S3.  No murmur, or thrill. Apical beat not displaced Peripheral pulses normal.  Abdomen: Soft, non tender, no organomegaly or masses. No bruits. Bowel sounds normal. No guarding, tenderness or rebound.     Musculoskeletal exam: Full ROM of spine, hips , shoulders and knees. No deformity ,swelling or crepitus noted. No muscle wasting or atrophy.   Neurologic: Cranial nerves 2 to 12 intact. Power, tone ,sensation and reflexes normal throughout. No disturbance in gait. No tremor.  Skin: Intact, no ulceration, erythema , scaling or rash noted. Pigmentation normal throughout  Psych; Normal mood and affect. Judgement and concentration normal   Assessment & Plan:  Annual physical exam Annual exam as documented. Counseling done  re healthy lifestyle involving commitment to 150 minutes exercise per week, heart healthy diet, and attaining healthy weight.The importance of adequate sleep also discussed. Regular seat belt use and home safety, is also discussed. Changes in health habits are decided on by the patient with goals and time frames  set for achieving them. Immunization and cancer screening needs  are specifically addressed at this visit.

## 2021-11-14 ENCOUNTER — Telehealth (HOSPITAL_COMMUNITY): Payer: Self-pay

## 2021-11-27 NOTE — Progress Notes (Signed)
Cardiology Office Note    Date:  12/05/2021   ID:  Tanya [name], DOB 1950-11-29, MRN [id number]   PCP:  Tanya Helper, MD   [place]  Cardiologist:  [name], MD   Advanced Practice Provider:  No care team member to display Electrophysiologist:  None   [phone]   Chief Complaint  Patient presents with   Follow-up    History of Present Illness:  Tanya [name] is a 71 y.o. female with history of  CAD s/p DESx2 to proximal LAD in 06/2018 with cath in 04/2019 showing 40% mid-LAD ISR with no significant stenosis along LM, LCx, or RCA. LVEDP 10, HTN, HLD, chronic DOE followed by Pulmonary   LOV Dr. Harl [name] 07/2020 she was having infrequent chest pains at times, right sided, chronic ongoing SOB unchanged and left calf pain with walking-ABI's normal. Has had recent GI complaints per pulm and PCP notes and started on pantoprazole.   She underwent endoscopy 01/18/21 and was found to have small hiatal hernia with erythematous mucosa in the gastric fundus that was biopsied and positive for H. Pylori.  F/U 08/03/21 DOE and echo ordered. LVEF 60-65% similar to before.  Patient has random sharp shooting chest pain all over her chest in different areas that last's a second and goes away. Occurs daily. Alks on treadmill 30 min daily without chest pain/dyspnea. Has 3 dogs and chickens. 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  Has heartburn daily. Not taking prilosec daily. She takes it prn. Encouraged her to take daily. Having MRI/MRA this month for headaches.    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 s/p DESx2 to proximal LAD in 06/2018 b. cath in 04/2019 showing 40% mid-LAD ISR with no significant stenosis along LM, LCx, or RCA   Cystitis    Depression    Elevated liver enzymes    Fatty liver    GERD (gastroesophageal reflux  disease)    Hard of hearing    Headache    migraine   Hyperlipidemia    Hypertension    Insomnia    Migraine    Osteoarthritis    Unspecified hypothyroidism    Vaginitis    Wears glasses    Wears hearing aid    B/L    Past Surgical History:  Procedure Laterality Date   BIOPSY N/A 01/18/2021   Procedure: BIOPSY;  Surgeon: Harvel Quale, MD;  [facility];  Service: Gastroenterology;  Laterality: N/A;   CATARACT EXTRACTION W/PHACO Right 05/08/2019   Procedure: CATARACT EXTRACTION PHACO AND INTRAOCULAR LENS PLACEMENT (IOC);  Surgeon: Baruch Goldmann, MD;  Location: [facility];  Service: Ophthalmology;  Laterality: Right;  CDE: 10.02   COLONOSCOPY N/A 02/08/2016   Procedure: COLONOSCOPY;  Surgeon: [name], MD;  [facility];  Service: Endoscopy;  Laterality: N/A;  1200 - moved to 11:45 - Ann to notify   CORONARY STENT INTERVENTION N/A 06/06/2018   Procedure: CORONARY STENT INTERVENTION;  Surgeon: [name], [name], MD;  Location: [facility];  Service: Cardiovascular;  Laterality: N/A;   ESOPHAGOGASTRODUODENOSCOPY (EGD) WITH PROPOFOL N/A 01/18/2021   Procedure: ESOPHAGOGASTRODUODENOSCOPY (EGD) WITH PROPOFOL;  Surgeon: Harvel Quale, MD;  [facility];  Service: Gastroenterology;  Laterality: N/A;  10:30AM   INTRAVASCULAR  PRESSURE WIRE/FFR STUDY N/A 06/06/2018   Procedure: INTRAVASCULAR PRESSURE WIRE/FFR STUDY;  Surgeon: [name], [name], MD;  Location: [facility];  Service: Cardiovascular;  Laterality: N/A;   IR ANGIO INTRA EXTRACRAN SEL COM CAROTID INNOMINATE BILAT MOD SED  09/01/2019   IR ANGIO INTRA EXTRACRAN SEL INTERNAL CAROTID UNI L MOD SED  12/21/2019   IR ANGIO VERTEBRAL SEL VERTEBRAL BILAT MOD SED  09/01/2019   IR ANGIOGRAM FOLLOW UP STUDY  12/21/2019   IR TRANSCATH/EMBOLIZ  12/21/2019   LEFT HEART CATH AND CORONARY ANGIOGRAPHY N/A 04/24/2019   Procedure: LEFT HEART CATH AND CORONARY ANGIOGRAPHY;  Surgeon: Belva Crome, MD;  Location: [facility];  Service: Cardiovascular;  Laterality: N/A;   LEFT HEART CATHETERIZATION WITH CORONARY ANGIOGRAM N/A 11/19/2014   Procedure: LEFT HEART CATHETERIZATION WITH CORONARY ANGIOGRAM;  Surgeon: [name], MD;  Location: [facility];  Service: Cardiovascular;  Laterality: N/A;   RADIOLOGY WITH ANESTHESIA N/A 11/23/2019   Procedure: Drucilla Schmidt;  Surgeon: Luanne Bras, MD;  Location: [facility];  Service: Radiology;  Laterality: N/A;   RADIOLOGY WITH ANESTHESIA N/A 12/21/2019   Procedure: EMBOLIZATION;  Surgeon: Luanne Bras, MD;  Location: [facility];  Service: Radiology;  Laterality: N/A;   RIGHT/LEFT HEART CATH AND CORONARY ANGIOGRAPHY N/A 06/06/2018   Procedure: RIGHT/LEFT HEART CATH AND CORONARY ANGIOGRAPHY;  Surgeon: [name], [name], MD;  Location: [facility];  Service: Cardiovascular;  Laterality: N/A;    Current Medications: Current Meds  Medication Sig   acetaminophen (TYLENOL) 500 MG tablet Take 1,000 mg by mouth every 6 (six) hours as needed for moderate pain.   amLODipine (NORVASC) 10 MG tablet Take 1 tablet (10 mg total) by mouth daily.   Ascorbic Acid (VITAMIN C) 1000 MG tablet Take 1,000 mg by mouth daily.   aspirin EC 81 MG tablet Take 81 mg by mouth daily. Swallow whole.   cholecalciferol (VITAMIN D3) 25 MCG (1000 UNIT) tablet Take 1,000 Units by mouth daily.   cloNIDine (CATAPRES) 0.3 MG tablet Take 0.3 mg by mouth at bedtime.   clopidogrel (PLAVIX) 75 MG tablet TAKE 1 TABLET BY MOUTH  DAILY   metoprolol succinate (TOPROL-XL) 25 MG 24 hr tablet TAKE ONE-HALF TABLET BY  MOUTH DAILY   nitroGLYCERIN (NITROSTAT) 0.4 MG SL tablet Place 1 tablet (0.4 mg total) under the tongue every 5 (five) minutes as needed.   omeprazole (PRILOSEC) 40 MG capsule Take 1 capsule (40 mg total) by mouth daily.   PARoxetine (PAXIL) 10 MG tablet Take 1 tablet (10 mg total) by mouth daily.   Potassium Chloride ER 20 MEQ TBCR Take 1 tablet by mouth daily.    prednisoLONE acetate (PRED FORTE) 1 % ophthalmic suspension    rosuvastatin (CRESTOR) 40 MG tablet TAKE 1 TABLET BY MOUTH  DAILY   temazepam (RESTORIL) 15 MG capsule Take 1 capsule (15 mg total) by mouth at bedtime as needed for sleep.   topiramate (TOPAMAX) 50 MG tablet Take 1 tablet (50 mg total) by mouth at bedtime.     Allergies:   Patient has no known allergies.   Social History   Socioeconomic History   Marital status: Married    Spouse name: Richard   Number of children: 0   Years of education: 12   Highest education level: Not on file  Occupational History   Not on file  Tobacco Use   Smoking status: Never   Smokeless tobacco: Never  Vaping Use   Vaping Use:  Never used  Substance and Sexual Activity   Alcohol use: Never   Drug use: Never   Sexual activity: Not on file  Other Topics Concern   Not on file  Social History Narrative   Pt originally from the [place] been in [place] for 20 years  ,lives with husband   Caffeine coffee 1 c daily    Social Determinants of Health   Financial Resource Strain: Low Risk    Difficulty of Paying Living Expenses: Not hard at all  Food Insecurity: No Food Insecurity   Worried About Charity fundraiser in the Last Year: Never true   Arboriculturist in the Last Year: Never true  Transportation Needs: No Transportation Needs   Lack of Transportation (Medical): No   Lack of Transportation (Non-Medical): No  Physical Activity: Sufficiently Active   Days of Exercise per Week: 7 days   Minutes of Exercise per Session: 30 min  Stress: No Stress Concern Present   Feeling of Stress : Not at all  Social Connections: Moderately Isolated   Frequency of Communication with Friends and Family: Three times a week   Frequency of Social Gatherings with Friends and Family: Once a week   Attends Religious Services: Never   Marine scientist or Organizations: No   Attends Music therapist: Never   Marital Status: Married      Family History:  The patient's   family history includes Dementia in her mother; Heart attack in her father; Heart disease in her father and mother; Hypertension in her father and mother.   ROS:   Please see the history of present illness.    ROS All other systems reviewed and are negative.   PHYSICAL EXAM:   VS:  BP 138/76    Pulse 74    Ht 5\' 4"  (1.626 m)    Wt 135 lb 12.8 oz (61.6 kg)    SpO2 100%    BMI 23.31 kg/m   Physical Exam  GEN: Thin, in no acute distress  Neck: no JVD, carotid bruits, or masses Cardiac:RRR; no murmurs, rubs, or gallops  Respiratory:  clear to auscultation bilaterally, normal work of breathing GI: soft, nontender, nondistended, + BS Ext: without cyanosis, clubbing, or edema, Good distal pulses bilaterally Neuro:  Alert and Oriented x 3 Psych: euthymic mood, full affect  Wt Readings from Last 3 Encounters:  12/05/21 135 lb 12.8 oz (61.6 kg)  11/10/21 137 lb 1.9 oz (62.2 kg)  10/31/21 136 lb 12.8 oz (62.1 kg)      Studies/Labs Reviewed:   EKG:  EKG is  ordered today.  The ekg ordered today demonstrates NSR, nonspecific ST changes Recent Labs: 10/30/2021: Hemoglobin 12.7; Platelets 297; TSH 3.530 11/10/2021: ALT 16; BUN 15; Creatinine, Ser 0.58; Potassium 4.1; Sodium 142   Lipid Panel    Component Value Date/Time   CHOL 120 10/30/2021 1025   TRIG 49 10/30/2021 1025   HDL 62 10/30/2021 1025   CHOLHDL 1.9 10/30/2021 1025   CHOLHDL 2.0 01/27/2020 1022   VLDL 11 06/29/2019 0946   LDLCALC 46 10/30/2021 1025   LDLCALC 44 01/27/2020 1022    Additional studies/ records that were reviewed today include:  Echo 09/15/21 IMPRESSIONS     1. Left ventricular ejection fraction, by estimation, is 60 to 65%. The  left ventricle has normal function. The left ventricle has no regional  wall motion abnormalities. Left ventricular diastolic parameters are  indeterminate.   2. Right ventricular systolic  function is normal. The right ventricular  size is  normal. Tricuspid regurgitation signal is inadequate for assessing  PA pressure.   3. The mitral valve is grossly normal. Mild mitral valve regurgitation.   4. The aortic valve is tricuspid. Aortic valve regurgitation is not  visualized.   Comparison(s): Prior images reviewed side by side. LVEF remains normal  range. ABI's 08/18/20 Summary:  Right: Resting right ankle-brachial index is within normal range. No  evidence of significant right lower extremity arterial disease. The right  toe-brachial index is normal.   Left: Resting left ankle-brachial index is within normal range. No  evidence of significant left lower extremity arterial disease. The left  toe-brachial index is normal.    Cardiac Catheterization: 04/2019 40% eccentric mid LAD in-stent restenosis.  At completion of stent procedure in September 2020, there was residual stent deformity in this region.  There is no high-grade obstruction in the LAD stent.  The LAD is otherwise normal. Normal left main Normal circumflex Normal RCA Normal LV systolic function and end-diastolic pressure   RECOMMENDATIONS:   Dyspnea cannot be explained by findings on today's study based on absence of significant coronary obstruction and normal LV hemodynamics. Continue dual antiplatelet therapy beyond 12 months due to overlapping stents in the LAD.   Echocardiogram: 06/2019 IMPRESSIONS     1. Left ventricular ejection fraction, by visual estimation, is 55 to  60%. The left ventricle has normal function. There is no left ventricular  hypertrophy.   2. Left ventricular diastolic Doppler parameters are consistent with  impaired relaxation pattern of LV diastolic filling.   3. Global right ventricle has normal systolic function.The right  ventricular size is normal. No increase in right ventricular wall  thickness.   4. Left atrial size was normal.   5. Right atrial size was normal.   6. The mitral valve is normal in structure. Trace  mitral valve  regurgitation. No evidence of mitral stenosis.   7. The tricuspid valve is normal in structure. Tricuspid valve  regurgitation was not visualized by color flow Doppler.   8. The aortic valve is tricuspid Aortic valve regurgitation was not  visualized by color flow Doppler. Mild aortic valve sclerosis without  stenosis.   9. The pulmonic valve was not well visualized. Pulmonic valve  regurgitation is not visualized by color flow Doppler.  10. Normal pulmonary artery systolic pressure.  11. The inferior vena cava IVC is small, suggesting low RA pressure and  hypovolemia.    Long-term Monitor: 01/2020 Sinus rhythm with rare PACs and PVCs and short bursts of SVT (possibly atrial tachycardia).         Risk Assessment/Calculations:         ASSESSMENT:    1. CAD in native artery   2. Dyspnea on exertion   3. Essential hypertension   4. Other hyperlipidemia      PLAN:  In order of problems listed above:  CAD s/p DESx2 to proximal LAD in 06/2018 with cath in 04/2019 showing 40% mid-LAD ISR with no significant stenosis along LM, LCx, or RCA. LVEDP 10. Atypical chest pain and normal EKG. 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    DOE Chronic followed by [employer] on treadmill 30 min daily. Echo 08/2021 normal LVEF   HTN BP normal today. Doing better on increased clonidine 0.3 mg qHS    HLD-on Crestor LDL 46 10/2021   GERD and abdominal pain-Endo with  biopsy confirmed small hiatal hernia with H. Pylori-much improved after treatment but now daily heartburn and only taking prilosec prn. Encouraged her to take daily.  Shared Decision Making/Informed Consent        Medication Adjustments/Labs and Tests Ordered: Current medicines are reviewed at length with the patient today.  Concerns regarding medicines are outlined above.  Medication changes, Labs and Tests ordered today are listed in the Patient Instructions  below. Patient Instructions  Medication Instructions:  Your physician recommends that you continue on your current medications as directed. Please refer to the Current Medication list given to you today.  *If you need a refill on your cardiac medications before your next appointment, please call your pharmacy*   Lab Work: NONE   If you have labs (blood work) drawn today and your tests are completely normal, you will receive your results only by: [place] (if you have MyChart) OR A paper copy in the mail If you have any lab test that is abnormal or we need to change your treatment, we will call you to review the results.   Testing/Procedures: NONE    Follow-Up: At [facility], you and your health needs are our priority.  As part of our continuing mission to provide you with exceptional heart care, we have created designated Provider Care Teams.  These Care Teams include your primary Cardiologist (physician) and Advanced Practice Providers (APPs -  Physician Assistants and Nurse Practitioners) who all work together to provide you with the care you need, when you need it.  We recommend signing up for the patient portal called "MyChart".  Sign up information is provided on this After Visit Summary.  MyChart is used to connect with patients for Virtual Visits (Telemedicine).  Patients are able to view lab/test results, encounter notes, upcoming appointments, etc.  Non-urgent messages can be sent to your provider as well.   To learn more about what you can do with MyChart, go to [email or web address].    Your next appointment:   6 month(s)  The format for your next appointment:   In Person  Provider:   [name], MD    Other Instructions Thank you for choosing [facility]!       Sumner Boast, PA-C  12/05/2021 11:27 AM    [facility] [place], [place], [place]  [address] Phone: [phone]; Fax: [phone]

## 2021-11-29 ENCOUNTER — Ambulatory Visit: Payer: Medicare Other | Admitting: Cardiology

## 2021-12-05 ENCOUNTER — Other Ambulatory Visit: Payer: Self-pay

## 2021-12-05 ENCOUNTER — Ambulatory Visit (INDEPENDENT_AMBULATORY_CARE_PROVIDER_SITE_OTHER): Payer: Medicare Other | Admitting: Physician Assistant

## 2021-12-05 ENCOUNTER — Encounter: Payer: Self-pay | Admitting: Physician Assistant

## 2021-12-05 VITALS — BP 138/76 | HR 74 | Ht 64.0 in | Wt 135.8 lb

## 2021-12-05 DIAGNOSIS — I2510 Atherosclerotic heart disease of native coronary artery without angina pectoris: Secondary | ICD-10-CM | POA: Diagnosis not present

## 2021-12-05 DIAGNOSIS — E7849 Other hyperlipidemia: Secondary | ICD-10-CM | POA: Diagnosis not present

## 2021-12-05 DIAGNOSIS — R0609 Other forms of dyspnea: Secondary | ICD-10-CM | POA: Diagnosis not present

## 2021-12-05 DIAGNOSIS — I10 Essential (primary) hypertension: Secondary | ICD-10-CM | POA: Diagnosis not present

## 2021-12-05 NOTE — Patient Instructions (Signed)
Medication Instructions:  Your physician recommends that you continue on your current medications as directed. Please refer to the Current Medication list given to you today.  *If you need a refill on your cardiac medications before your next appointment, please call your pharmacy*   Lab Work: NONE   If you have labs (blood work) drawn today and your tests are completely normal, you will receive your results only by: . MyChart Message (if you have MyChart) OR . A paper copy in the mail If you have any lab test that is abnormal or we need to change your treatment, we will call you to review the results.   Testing/Procedures: NONE    Follow-Up: At [facility], you and your health needs are our priority.  As part of our continuing mission to provide you with exceptional heart care, we have created designated Provider Care Teams.  These Care Teams include your primary Cardiologist (physician) and Advanced Practice Providers (APPs -  Physician Assistants and Nurse Practitioners) who all work together to provide you with the care you need, when you need it.  We recommend signing up for the patient portal called "MyChart".  Sign up information is provided on this After Visit Summary.  MyChart is used to connect with patients for Virtual Visits (Telemedicine).  Patients are able to view lab/test results, encounter notes, upcoming appointments, etc.  Non-urgent messages can be sent to your provider as well.   To learn more about what you can do with MyChart, go to [email or web address].    Your next appointment:   6 month(s)  The format for your next appointment:   In Person  Provider:   Jonathan Branch, MD   Other Instructions Thank you for choosing [facility]!    

## 2021-12-05 NOTE — Addendum Note (Signed)
Addended by: Barbarann Ehlers A on: 12/05/2021 03:41 PM ? ? Modules accepted: Orders ? ?

## 2021-12-11 ENCOUNTER — Ambulatory Visit (HOSPITAL_COMMUNITY): Payer: Medicare Other

## 2021-12-13 ENCOUNTER — Other Ambulatory Visit: Payer: Self-pay

## 2021-12-13 ENCOUNTER — Ambulatory Visit (HOSPITAL_COMMUNITY)
Admission: RE | Admit: 2021-12-13 | Discharge: 2021-12-13 | Disposition: A | Discharge status: Home / Self Care | Payer: Medicare Other | Source: Ambulatory Visit | Attending: Family Medicine | Admitting: Family Medicine

## 2021-12-13 DIAGNOSIS — Z1231 Encounter for screening mammogram for malignant neoplasm of breast: Secondary | ICD-10-CM | POA: Diagnosis not present

## 2021-12-25 ENCOUNTER — Ambulatory Visit (HOSPITAL_COMMUNITY)
Admission: RE | Admit: 2021-12-25 | Discharge: 2021-12-25 | Disposition: A | Discharge status: Home / Self Care | Payer: Medicare Other | Source: Ambulatory Visit | Attending: Interventional Radiology | Admitting: Interventional Radiology

## 2021-12-25 ENCOUNTER — Other Ambulatory Visit: Payer: Self-pay

## 2021-12-25 DIAGNOSIS — I671 Cerebral aneurysm, nonruptured: Secondary | ICD-10-CM

## 2021-12-25 DIAGNOSIS — R519 Headache, unspecified: Secondary | ICD-10-CM | POA: Diagnosis not present

## 2021-12-28 ENCOUNTER — Telehealth: Payer: Self-pay | Admitting: Family Medicine

## 2021-12-28 ENCOUNTER — Other Ambulatory Visit: Payer: Self-pay | Admitting: Internal Medicine

## 2021-12-28 DIAGNOSIS — F419 Anxiety disorder, unspecified: Secondary | ICD-10-CM

## 2021-12-28 DIAGNOSIS — F418 Other specified anxiety disorders: Secondary | ICD-10-CM

## 2021-12-28 MED ORDER — TEMAZEPAM 15 MG PO CAPS: 15.0000 mg | ORAL_CAPSULE | Freq: Every evening | ORAL | 0 refills | Status: DC | PRN

## 2021-12-28 NOTE — Telephone Encounter (Signed)
Patient needs refill ?temazepam (RESTORIL) 15 MG capsule ?

## 2022-01-26 ENCOUNTER — Other Ambulatory Visit: Payer: Self-pay

## 2022-01-26 ENCOUNTER — Telehealth: Payer: Self-pay | Admitting: Family Medicine

## 2022-01-26 DIAGNOSIS — F419 Anxiety disorder, unspecified: Secondary | ICD-10-CM

## 2022-01-26 DIAGNOSIS — F418 Other specified anxiety disorders: Secondary | ICD-10-CM

## 2022-01-26 NOTE — Telephone Encounter (Signed)
Patient needs refill on  ? ?temazepam (RESTORIL) 15 MG capsule  ?

## 2022-01-29 ENCOUNTER — Other Ambulatory Visit: Payer: Self-pay | Admitting: Family Medicine

## 2022-01-29 MED ORDER — TEMAZEPAM 15 MG PO CAPS: 15.0000 mg | ORAL_CAPSULE | Freq: Every day | ORAL | 3 refills | Status: DC

## 2022-02-13 ENCOUNTER — Other Ambulatory Visit: Payer: Self-pay

## 2022-02-13 MED ORDER — CLONIDINE HCL 0.3 MG PO TABS: 0.3000 mg | ORAL_TABLET | Freq: Every day | ORAL | 5 refills | Status: DC

## 2022-02-15 ENCOUNTER — Telehealth: Payer: Self-pay

## 2022-02-15 MED ORDER — CLONIDINE HCL 0.3 MG PO TABS: 0.3000 mg | ORAL_TABLET | Freq: Every day | ORAL | 5 refills | Status: DC

## 2022-02-15 NOTE — Telephone Encounter (Signed)
Medication refill request for Clonidine 0.3 mg tablets approved and sent to [facility] in [place].

## 2022-02-19 ENCOUNTER — Telehealth: Payer: Self-pay | Admitting: Cardiology

## 2022-02-19 MED ORDER — METOPROLOL SUCCINATE ER 25 MG PO TB24: 12.5000 mg | ORAL_TABLET | Freq: Every day | ORAL | 3 refills | Status: DC

## 2022-02-19 NOTE — Telephone Encounter (Signed)
Tanya [name] needs more refills for metoprolol '25MG'$  sent to [facility].

## 2022-02-19 NOTE — Telephone Encounter (Signed)
Completed.

## 2022-02-20 ENCOUNTER — Ambulatory Visit (INDEPENDENT_AMBULATORY_CARE_PROVIDER_SITE_OTHER): Payer: Medicare Other | Admitting: Family Medicine

## 2022-02-20 ENCOUNTER — Encounter: Payer: Self-pay | Admitting: Family Medicine

## 2022-02-20 VITALS — BP 130/69 | HR 64 | Ht 64.0 in | Wt 138.0 lb

## 2022-02-20 DIAGNOSIS — R0609 Other forms of dyspnea: Secondary | ICD-10-CM | POA: Diagnosis not present

## 2022-02-20 DIAGNOSIS — K219 Gastro-esophageal reflux disease without esophagitis: Secondary | ICD-10-CM

## 2022-02-20 DIAGNOSIS — I10 Essential (primary) hypertension: Secondary | ICD-10-CM | POA: Diagnosis not present

## 2022-02-20 DIAGNOSIS — E7849 Other hyperlipidemia: Secondary | ICD-10-CM | POA: Diagnosis not present

## 2022-02-20 DIAGNOSIS — M7989 Other specified soft tissue disorders: Secondary | ICD-10-CM

## 2022-02-20 DIAGNOSIS — G43019 Migraine without aura, intractable, without status migrainosus: Secondary | ICD-10-CM | POA: Diagnosis not present

## 2022-02-20 DIAGNOSIS — I2510 Atherosclerotic heart disease of native coronary artery without angina pectoris: Secondary | ICD-10-CM

## 2022-02-20 NOTE — Assessment & Plan Note (Signed)
6 month  history, has seen vascular in the past, right worse than left will send for re eval;, has 15 mmHG compression hose which she wears

## 2022-02-20 NOTE — Patient Instructions (Addendum)
F/u in 4 months, flu vaccine at visit, call if you need me sooner  You are referred to lung specialist and vein specialist as we discussed  No changes in medication   Fasting lipid, cmp and EGFR 1 week before next visit  It is important that you exercise regularly at least 30 minutes 5 times a week. If you develop chest pain, have severe difficulty breathing, or feel very tired, stop exercising immediately and seek medical attention   Thanks for choosing [facility], we consider it a privelige to serve you.

## 2022-02-25 ENCOUNTER — Encounter: Payer: Self-pay | Admitting: Family Medicine

## 2022-02-25 NOTE — Progress Notes (Signed)
   Tanya [name]     MRN: [id number]      DOB: September 18, 1951   HPI Tanya [name] is here for follow up and re-evaluation of chronic medical conditions, medication management and review of any available recent lab and radiology data.  Preventive health is updated, specifically  Cancer screening and Immunization.   Has recently been evaluated by Cardiology, no new abnormality, clinically stable from theoir standpoint C/o excessive exertional fatigue and poor exercise tolerance. Denies  cough, fever or sputum production C/o bilateral leg swelling , denies PND, orthopnea or new chest pain  ROS Denies recent fever or chills. Denies sinus pressure, nasal congestion, ear pain or sore throat. Denies chest congestion, productive cough or wheezing.  Denies abdominal pain, nausea, vomiting,diarrhea or constipation.   Denies dysuria, frequency, hesitancy or incontinence. Denies joint pain, swelling and limitation in mobility. Denies uncontrolled  headaches, seizures, numbness, or tingling. Denies depression, uncontrolled anxiety or insomnia. Denies skin break down or rash.   PE  BP 130/69   Pulse 64   Ht '5\' 4"'$  (1.626 m)   Wt 138 lb 0.6 oz (62.6 kg)   SpO2 98%   BMI 23.69 kg/m   Patient alert and oriented and in no cardiopulmonary distress.  HEENT: No facial asymmetry, EOMI,     Neck supple .  Chest: Clear to auscultation bilaterally.  CVS: S1, S2 no murmurs, no S3.Regular rate.  ABD: Soft non tender.   Ext: No edema  MS: Adequate ROM spine, shoulders, hips and knees.  Skin: Intact, no ulcerations or rash noted.  Psych: Good eye contact, normal affect. Memory intact not anxious or depressed appearing.  CNS: CN 2-12 intact, power,  normal throughout.no focal deficits noted.   Assessment & Plan  Leg swelling 6 month  history, has seen vascular in the past, right worse than left will send for re eval;, has 15 mmHG compression hose which she wears  Essential  hypertension Controlled, no change in medication DASH diet and commitment to daily physical activity for a minimum of 30 minutes discussed and encouraged, as a part of hypertension management. The importance of attaining a healthy weight is also discussed.     02/20/2022   10:47 AM 12/05/2021   10:57 AM 11/10/2021   10:53 AM 10/31/2021   10:20 AM 08/10/2021   11:27 AM 08/03/2021    9:55 AM 05/23/2021    3:52 PM  BP/Weight  Systolic BP 355 732 202 542 706 237 628  Diastolic BP 69 76 69 62 80 80 83  Wt. (Lbs) 138.04 135.8 137.12 136.8 135.12 137.2 131  BMI 23.69 kg/m2 23.31 kg/m2 23.54 kg/m2 23.48 kg/m2 23.19 kg/m2 23.55 kg/m2 22.49 kg/m2       Migraine without aura Controlled, no change in medication   Hyperlipemia Hyperlipidemia:Low fat diet discussed and encouraged.   Lipid Panel  Lab Results  Component Value Date   CHOL 120 10/30/2021   HDL 62 10/30/2021   LDLCALC 46 10/30/2021   TRIG 49 10/30/2021   CHOLHDL 1.9 10/30/2021    Controlled, no change in medication Updated lab needed at/ before next visit.    GERD (gastroesophageal reflux disease) Controlled, no change in medication   CAD (coronary artery disease), native coronary artery No re occlusion noted on study In 08/2021, denies exertional angina or dyspnea  Dyspnea on exertion approx 5 monht h/o progressive exertional dyspnea and poor exercise tolerance. RecentCardiology eval negative, refer to Pulmonary

## 2022-02-25 NOTE — Assessment & Plan Note (Signed)
approx 5 monht h/o progressive exertional dyspnea and poor exercise tolerance. RecentCardiology eval negative, refer to Pulmonary

## 2022-02-25 NOTE — Assessment & Plan Note (Signed)
Hyperlipidemia:Low fat diet discussed and encouraged.   Lipid Panel  Lab Results  Component Value Date   CHOL 120 10/30/2021   HDL 62 10/30/2021   LDLCALC 46 10/30/2021   TRIG 49 10/30/2021   CHOLHDL 1.9 10/30/2021    Controlled, no change in medication Updated lab needed at/ before next visit.

## 2022-02-25 NOTE — Assessment & Plan Note (Signed)
Controlled, no change in medication  

## 2022-02-25 NOTE — Assessment & Plan Note (Signed)
Controlled, no change in medication DASH diet and commitment to daily physical activity for a minimum of 30 minutes discussed and encouraged, as a part of hypertension management. The importance of attaining a healthy weight is also discussed.     02/20/2022   10:47 AM 12/05/2021   10:57 AM 11/10/2021   10:53 AM 10/31/2021   10:20 AM 08/10/2021   11:27 AM 08/03/2021    9:55 AM 05/23/2021    3:52 PM  BP/Weight  Systolic BP 329 191 660 600 459 977 414  Diastolic BP 69 76 69 62 80 80 83  Wt. (Lbs) 138.04 135.8 137.12 136.8 135.12 137.2 131  BMI 23.69 kg/m2 23.31 kg/m2 23.54 kg/m2 23.48 kg/m2 23.19 kg/m2 23.55 kg/m2 22.49 kg/m2

## 2022-02-25 NOTE — Assessment & Plan Note (Signed)
No re occlusion noted on study In 08/2021, denies exertional angina or dyspnea

## 2022-03-16 ENCOUNTER — Other Ambulatory Visit: Payer: Self-pay | Admitting: Family Medicine

## 2022-03-16 DIAGNOSIS — I10 Essential (primary) hypertension: Secondary | ICD-10-CM

## 2022-03-23 ENCOUNTER — Other Ambulatory Visit: Payer: Self-pay | Admitting: *Deleted

## 2022-03-23 DIAGNOSIS — I872 Venous insufficiency (chronic) (peripheral): Secondary | ICD-10-CM

## 2022-03-23 DIAGNOSIS — M7989 Other specified soft tissue disorders: Secondary | ICD-10-CM

## 2022-04-03 ENCOUNTER — Other Ambulatory Visit: Payer: Self-pay | Admitting: Family Medicine

## 2022-04-03 DIAGNOSIS — R12 Heartburn: Secondary | ICD-10-CM

## 2022-04-04 NOTE — Progress Notes (Unsigned)
[facility] [place]   Reason for referral: Swollen Left leg  History of Present Illness  Tanya [name] is a 71 y.o. female who presents with chief complaint: Left LE swelling leg.  She has known OA in the left knee.   She denies prior history of DVT or vein intervention.  She denies claudication, non healing wounds or rest pain.  She was seen in our office 6 months ago with the same complaint.  Her pedal pulses were palpable and her venous duplex showed not evidence of venous reflux or  DVT.  She was placed in mild compression 15-20 mm hg and elevation.    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 s/p DESx2 to proximal LAD in 06/2018 b. cath in 04/2019 showing 40% mid-LAD ISR with no significant stenosis along LM, LCx, or RCA   Cystitis    Depression    Elevated liver enzymes    Fatty liver    GERD (gastroesophageal reflux disease)    Hard of hearing    Headache    migraine   Hyperlipidemia    Hypertension    Insomnia    Migraine    Osteoarthritis    Unspecified hypothyroidism    Vaginitis    Wears glasses    Wears hearing aid    B/L    Past Surgical History:  Procedure Laterality Date   BIOPSY N/A 01/18/2021   Procedure: BIOPSY;  Surgeon: Harvel Quale, MD;  [facility];  Service: Gastroenterology;  Laterality: N/A;   CATARACT EXTRACTION W/PHACO Right 05/08/2019   Procedure: CATARACT EXTRACTION PHACO AND INTRAOCULAR LENS PLACEMENT (IOC);  Surgeon: Baruch Goldmann, MD;  Location: [facility];  Service: Ophthalmology;  Laterality: Right;  CDE: 10.02   COLONOSCOPY N/A 02/08/2016   Procedure: COLONOSCOPY;  Surgeon: [name], MD;  [facility];  Service: Endoscopy;  Laterality: N/A;  1200 - moved to 11:45 - Ann to notify   CORONARY STENT INTERVENTION N/A 06/06/2018   Procedure: CORONARY STENT INTERVENTION;   Surgeon: [name], [name], MD;  Location: [facility];  Service: Cardiovascular;  Laterality: N/A;   ESOPHAGOGASTRODUODENOSCOPY (EGD) WITH PROPOFOL N/A 01/18/2021   Procedure: ESOPHAGOGASTRODUODENOSCOPY (EGD) WITH PROPOFOL;  Surgeon: Harvel Quale, MD;  [facility];  Service: Gastroenterology;  Laterality: N/A;  10:30AM   INTRAVASCULAR PRESSURE WIRE/FFR STUDY N/A 06/06/2018   Procedure: INTRAVASCULAR PRESSURE WIRE/FFR STUDY;  Surgeon: [name], [name], MD;  Location: [facility];  Service: Cardiovascular;  Laterality: N/A;   IR ANGIO INTRA EXTRACRAN SEL COM CAROTID INNOMINATE BILAT MOD SED  09/01/2019   IR ANGIO INTRA EXTRACRAN SEL INTERNAL CAROTID UNI L MOD SED  12/21/2019   IR ANGIO VERTEBRAL SEL VERTEBRAL BILAT MOD SED  09/01/2019   IR ANGIOGRAM FOLLOW UP STUDY  12/21/2019   IR TRANSCATH/EMBOLIZ  12/21/2019   LEFT HEART CATH AND CORONARY ANGIOGRAPHY N/A 04/24/2019   Procedure: LEFT HEART CATH AND CORONARY ANGIOGRAPHY;  Surgeon: Belva Crome, MD;  Location: [facility];  Service: Cardiovascular;  Laterality: N/A;   LEFT HEART CATHETERIZATION WITH CORONARY ANGIOGRAM N/A 11/19/2014   Procedure: LEFT HEART CATHETERIZATION WITH CORONARY ANGIOGRAM;  Surgeon: [name], MD;  Location: [facility];  Service: Cardiovascular;  Laterality: N/A;   RADIOLOGY  WITH ANESTHESIA N/A 11/23/2019   Procedure: Drucilla Schmidt;  Surgeon: Luanne Bras, MD;  Location: [place];  Service: Radiology;  Laterality: N/A;   RADIOLOGY WITH ANESTHESIA N/A 12/21/2019   Procedure: EMBOLIZATION;  Surgeon: Luanne Bras, MD;  Location: [place];  Service: Radiology;  Laterality: N/A;   RIGHT/LEFT HEART CATH AND CORONARY ANGIOGRAPHY N/A 06/06/2018   Procedure: RIGHT/LEFT HEART CATH AND CORONARY ANGIOGRAPHY;  Surgeon: [name], [name], MD;  Location: [place] [facility];  Service: Cardiovascular;  Laterality: N/A;    Social History   Socioeconomic History   Marital status: Married     Spouse name: Richard   Number of children: 0   Years of education: 12   Highest education level: Not on file  Occupational History   Not on file  Tobacco Use   Smoking status: Never   Smokeless tobacco: Never  Vaping Use   Vaping Use: Never used  Substance and Sexual Activity   Alcohol use: Never   Drug use: Never   Sexual activity: Not on file  Other Topics Concern   Not on file  Social History Narrative   Pt originally from the [place] been in [place] for 20 years  ,lives with husband   Caffeine coffee 1 c daily    Social Determinants of Health   Financial Resource Strain: Low Risk  (04/05/2021)   Overall Financial Resource Strain (CARDIA)    Difficulty of Paying Living Expenses: Not hard at all  Food Insecurity: No Food Insecurity (04/05/2021)   Hunger Vital Sign    Worried About Running Out of Food in the Last Year: Never true    [place] in the Last Year: Never true  Transportation Needs: No Transportation Needs (04/05/2021)   PRAPARE - Hydrologist (Medical): No    Lack of Transportation (Non-Medical): No  Physical Activity: Sufficiently Active (04/05/2021)   Exercise Vital Sign    Days of Exercise per Week: 7 days    Minutes of Exercise per Session: 30 min  Stress: No Stress Concern Present (04/05/2021)   [place]    Feeling of Stress : Not at all  Social Connections: Moderately Isolated (04/05/2021)   Social Connection and Isolation Panel [NHANES]    Frequency of Communication with Friends and Family: Three times a week    Frequency of Social Gatherings with Friends and Family: Once a week    Attends Religious Services: Never    Marine scientist or Organizations: No    Attends Archivist Meetings: Never    Marital Status: Married  Human resources officer Violence: Not At Risk (04/05/2021)   Humiliation, Afraid, Rape, and Kick questionnaire    Fear of Current or  Ex-Partner: No    Emotionally Abused: No    Physically Abused: No    Sexually Abused: No    Family History  Problem Relation Age of Onset   Dementia Mother    Heart disease Mother        heart attack    Hypertension Mother    Heart attack Father    Heart disease Father        heart attack    Hypertension Father     Current Outpatient Medications on File Prior to Visit  Medication Sig Dispense Refill   acetaminophen (TYLENOL) 500 MG tablet Take 1,000 mg by mouth every 6 (six) hours as needed for moderate pain.     amLODipine (  NORVASC) 10 MG tablet TAKE 1 TABLET BY MOUTH  DAILY 90 tablet 3   Ascorbic Acid (VITAMIN C) 1000 MG tablet Take 1,000 mg by mouth daily.     aspirin EC 81 MG tablet Take 81 mg by mouth daily. [name] whole.     cholecalciferol (VITAMIN D3) 25 MCG (1000 UNIT) tablet Take 1,000 Units by mouth daily.     cloNIDine (CATAPRES) 0.3 MG tablet Take 1 tablet (0.3 mg total) by mouth at bedtime. 30 tablet 5   clopidogrel (PLAVIX) 75 MG tablet TAKE 1 TABLET BY MOUTH  DAILY 90 tablet 3   metoprolol succinate (TOPROL-XL) 25 MG 24 hr tablet Take 0.5 tablets (12.5 mg total) by mouth daily. 45 tablet 3   nitroGLYCERIN (NITROSTAT) 0.4 MG SL tablet Place 1 tablet (0.4 mg total) under the tongue every 5 (five) minutes as needed. 25 tablet 3   omeprazole (PRILOSEC) 40 MG capsule TAKE 1 CAPSULE BY MOUTH  DAILY 90 capsule 3   Potassium Chloride ER 20 MEQ TBCR Take 1 tablet by mouth daily. 90 tablet 3   prednisoLONE acetate (PRED FORTE) 1 % ophthalmic suspension      rosuvastatin (CRESTOR) 40 MG tablet TAKE 1 TABLET BY MOUTH  DAILY 90 tablet 3   temazepam (RESTORIL) 15 MG capsule Take 1 capsule (15 mg total) by mouth at bedtime. 30 capsule 3   topiramate (TOPAMAX) 50 MG tablet Take 1 tablet (50 mg total) by mouth at bedtime. 90 tablet 4   No current facility-administered medications on file prior to visit.    Allergies as of 04/05/2022   (No Known Allergies)     ROS:    General:  No weight loss, Fever, chills  HEENT: No recent headaches, no nasal bleeding, no visual changes, no sore throat  Neurologic: No dizziness, blackouts, seizures. No recent symptoms of stroke or mini- stroke. No recent episodes of slurred speech, or temporary blindness.  Cardiac: No recent episodes of chest pain/pressure, no shortness of breath at rest.  No shortness of breath with exertion.  Denies history of atrial fibrillation or irregular heartbeat  Vascular: No history of rest pain in feet.  No history of claudication.  No history of non-healing ulcer, No history of DVT   Pulmonary: No home oxygen, no productive cough, no hemoptysis,  No asthma or wheezing  Musculoskeletal:  '[ ]'$  Arthritis, '[ ]'$  Low back pain,  '[ ]'$  Joint pain  Hematologic:No history of hypercoagulable state.  No history of easy bleeding.  No history of anemia  Gastrointestinal: No hematochezia or melena,  No gastroesophageal reflux, no trouble swallowing  Urinary: '[ ]'$  chronic Kidney disease, '[ ]'$  on HD - '[ ]'$  MWF or '[ ]'$  TTHS, '[ ]'$  Burning with urination, '[ ]'$  Frequent urination, '[ ]'$  Difficulty urinating;   Skin: No rashes  Psychological: No history of anxiety,  No history of depression  Physical Examination  There were no vitals filed for this visit.  There is no height or weight on file to calculate BMI.  General:  Alert and oriented, no acute distress HEENT: Normal Neck: No bruit or JVD Pulmonary: Clear to auscultation bilaterally Cardiac: Regular Rate and Rhythm without murmur Abdomen: Soft, non-tender, non-distended, no mass, no scars Skin: No rash Extremity Pulses:  2+ radial, brachial, femoral, dorsalis pedis, posterior tibial pulses bilaterally Musculoskeletal: No deformity or edema  Neurologic: Upper and lower extremity motor 5/5 and symmetric  DATA:  Assessment:  Plan: Roxy Horseman PA-C [facility]  [place] Office: [phone]  MD in clinic

## 2022-04-05 ENCOUNTER — Ambulatory Visit (HOSPITAL_COMMUNITY)
Admission: RE | Admit: 2022-04-05 | Discharge: 2022-04-05 | Disposition: A | Discharge status: Home / Self Care | Payer: Medicare Other | Source: Ambulatory Visit | Attending: Vascular Surgery | Admitting: Vascular Surgery

## 2022-04-05 ENCOUNTER — Ambulatory Visit (INDEPENDENT_AMBULATORY_CARE_PROVIDER_SITE_OTHER): Payer: Medicare Other | Admitting: Physician Assistant

## 2022-04-05 VITALS — BP 124/68 | HR 52 | Temp 97.8°F | Resp 20 | Ht 64.0 in | Wt 139.4 lb

## 2022-04-05 DIAGNOSIS — I872 Venous insufficiency (chronic) (peripheral): Secondary | ICD-10-CM | POA: Insufficient documentation

## 2022-04-05 DIAGNOSIS — M7989 Other specified soft tissue disorders: Secondary | ICD-10-CM | POA: Insufficient documentation

## 2022-04-06 ENCOUNTER — Encounter: Payer: Self-pay | Admitting: Internal Medicine

## 2022-04-06 ENCOUNTER — Ambulatory Visit: Payer: Medicare Other | Admitting: Internal Medicine

## 2022-04-06 ENCOUNTER — Ambulatory Visit (INDEPENDENT_AMBULATORY_CARE_PROVIDER_SITE_OTHER): Payer: Medicare Other | Admitting: Internal Medicine

## 2022-04-06 VITALS — BP 128/80 | HR 68 | Temp 98.7°F | Ht 64.0 in | Wt 141.2 lb

## 2022-04-06 DIAGNOSIS — R0609 Other forms of dyspnea: Secondary | ICD-10-CM

## 2022-04-06 NOTE — Progress Notes (Unsigned)
Tanya [name], female    DOB: [birth year],   MRN: [id number]  Brief patient profile:  71yo [place]illipino never smoker in [place] since 1980 and new onset doe x 2015  and progressed since to point where can't even even walk 50 ft "sometimes"    History of Present Illness  07/05/2020  Pulmonary/ 1st office eval/ Mystery Schrupp / [place] 50 ft to office on day of 1st eval  Chief Complaint  Patient presents with   Pulmonary Consult    Referred by [name], PA.  Pt c/o SOB x 2 years. She states she was winded walking from lobby to nearest exam room today.   Dyspnea:  Sometimes a  Little sob at rest and worse "when walk anywhere" s assoc cp or sweating / nausea  Uses treadmill once or twice a week using x 30 min s stopping at slt tilt (can't pace higher due to headache)  Cough: sometimes but not often and no assoc with sob Sleep: ok flat/ one pillow  SABA use: none  rec To get the most out of exercise, you need to be continuously aware that you are short of breath, but never out of breath, for 30 minutes daily.     If not improving what you will need is a cpst - please call in 6 weeks to schedule if not impoving.  Late ADD:   Chart review suggests also dx of possible gerd/lpr so rec give diet: GERD diet   And be sure to take protonix 40 Take 30-60 min before first meal of the day including the day of the cpst.   09/28/2020  f/u ov/[place] office/Labrina Lines re: unexplained sob/ fleeting cp and "really bad hb" not really sure what she takes for it. Chief Complaint  Patient presents with   Follow-up    Shortness of breath with exertion  Dyspnea:  No change Cough: none  Sleeping: not disturbing sleep  SABA use: none  02: none  CP on R ant comes and goes  "when is the last time you had it"  ?   A  Can't remember  How long does it last "maybe a second"  Rec Be sure you are taking Pantoprazole (protonix) 40 mg   Take  30-60 min before first meal of the day and add  Pepcid (famotidine)  20  mg one after supper  until return to office - this is the best way to tell whether stomach acid is contributing to your problem. GERD  diet  If still having problems with breathing  after you finish your stomach problem evaluation then I will need to see you back with all your medications in hand to order to consider what the next step should be.     04/06/2022 Re-establish  ov/[place] office/Laurin Paulo re: unexplained sob/ rest and ex  maint on ppi/ no asthma meds  Chief Complaint  Patient presents with   Follow-up    SOB is getting worse.   Dyspnea:  walking across the parking lot  Cough: variable, dry  Sleeping: no resp cc / bed flat one pillow SABA use: none  02: none  Gives variable answers to all questions except that she never has chest/ resp cc supine  Subxiphoid pain comes and goes for years, sometimes at rest, sometimes with exertion      No obvious day to day or daytime variability or assoc excess/ purulent sputum or mucus plugs or hemoptysis or chest tightness, subjective wheeze or overt sinus  or hb symptoms.   sleeping without nocturnal  or early am exacerbation  of respiratory  c/o's or need for noct saba. Also denies any obvious fluctuation of symptoms with weather or environmental changes or other aggravating or alleviating factors except as outlined above   No unusual exposure hx or h/o childhood pna/ asthma or knowledge of premature birth.  Current Allergies, Complete Past Medical History, Past Surgical History, Family History, and Social History were reviewed in [employer] record.  ROS  The following are not active complaints unless bolded Hoarseness, sore throat, dysphagia, dental problems, itching, sneezing,  nasal congestion or discharge of excess mucus or purulent secretions, ear ache,   fever, chills, sweats, unintended wt loss or wt gain, classically pleuritic   cp,  orthopnea pnd or arm/hand swelling  or leg swelling, presyncope,  palpitations, abdominal pain, anorexia, nausea, vomiting, diarrhea  or change in bowel habits or change in bladder habits, change in stools or change in urine, dysuria, hematuria,  rash, arthralgias, visual complaints, headache, numbness, weakness or ataxia or problems with walking or coordination,  change in mood or  memory.        Current Meds  Medication Sig   acetaminophen (TYLENOL) 500 MG tablet Take 1,000 mg by mouth every 6 (six) hours as needed for moderate pain.   amLODipine (NORVASC) 10 MG tablet TAKE 1 TABLET BY MOUTH  DAILY   Ascorbic Acid (VITAMIN C) 1000 MG tablet Take 1,000 mg by mouth daily.   aspirin EC 81 MG tablet Take 81 mg by mouth daily. Swallow whole.   cholecalciferol (VITAMIN D3) 25 MCG (1000 UNIT) tablet Take 1,000 Units by mouth daily.   cloNIDine (CATAPRES) 0.3 MG tablet Take 1 tablet (0.3 mg total) by mouth at bedtime.   clopidogrel (PLAVIX) 75 MG tablet TAKE 1 TABLET BY MOUTH  DAILY   metoprolol succinate (TOPROL-XL) 25 MG 24 hr tablet Take 0.5 tablets (12.5 mg total) by mouth daily.   nitroGLYCERIN (NITROSTAT) 0.4 MG SL tablet Place 1 tablet (0.4 mg total) under the tongue every 5 (five) minutes as needed.   omeprazole (PRILOSEC) 40 MG capsule TAKE 1 CAPSULE BY MOUTH  DAILY   Potassium Chloride ER 20 MEQ TBCR Take 1 tablet by mouth daily.   prednisoLONE acetate (PRED FORTE) 1 % ophthalmic suspension    rosuvastatin (CRESTOR) 40 MG tablet TAKE 1 TABLET BY MOUTH  DAILY   topiramate (TOPAMAX) 50 MG tablet Take 1 tablet (50 mg total) by mouth at bedtime.                    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 s/p DESx2 to proximal LAD in 06/2018 b. cath in 04/2019 showing 40% mid-LAD ISR with no significant stenosis along LM, LCx, or RCA   Cystitis    Elevated liver enzymes    Fatty liver    GERD (gastroesophageal reflux disease)    Headache    migraine    Hyperlipidemia    Hypertension    Insomnia    Unspecified hypothyroidism    Vaginitis    Wears glasses    Wears hearing aid    B/L        Objective:      04/06/2022         141   09/28/20 138 lb (  62.6 kg)  07/22/20 136 lb 12.8 oz (62.1 kg)  07/21/20 135 lb (61.2 kg)     Vital signs reviewed  04/06/2022  - Note at rest 02 sats  98% on RA   General appearance:    amb somber phillipno female extremely challenging historian   HEENT : Oropharynx  clear     Nasal turbinates nl    NECK :  without  apparent JVD/ palpable Nodes/TM    LUNGS: no acc muscle use,  Nl contour chest which is clear to A and P bilaterally without cough on insp or exp maneuvers   CV:  RRR  no s3  2/6 HSM s increase in P2, and no edema   ABD:  soft and nontender with nl inspiratory excursion in the supine position. No bruits or organomegaly appreciated   MS:  Nl gait/ ext warm without deformities Or obvious joint restrictions  calf tenderness, cyanosis or clubbing    SKIN: warm and dry without lesions    NEURO:  alert, approp, nl sensorium with  no motor or cerebellar deficits apparent.         Labs ordered/ reviewed:      Chemistry      Component Value Date/Time   NA 144 04/06/2022 0936   K 4.1 04/06/2022 0936   CL 110 (H) 04/06/2022 0936   CO2 21 04/06/2022 0936   BUN 13 04/06/2022 0936   CREATININE 0.78 04/06/2022 0936   CREATININE 0.68 10/21/2019 1019      Component Value Date/Time   CALCIUM 9.6 04/06/2022 0936   ALKPHOS 71 11/10/2021 1142   AST 19 11/10/2021 1142   ALT 16 11/10/2021 1142   BILITOT 0.3 11/10/2021 1142        Lab Results  Component Value Date   WBC 3.9 04/06/2022   HGB 12.9 04/06/2022   HCT 38.8 04/06/2022   MCV 94 04/06/2022   PLT 303 04/06/2022       EOS                                                             0.2                                     04/06/2022    Lab Results  Component Value Date   TSH 3.530 10/30/2021     Trop 1  04/06/2022   =  7  with CP off and on daily x years  BNP  04/06/2022      =  49  CXR 04/06/2022 > did not go for cxr as requested      Assessment

## 2022-04-06 NOTE — Progress Notes (Deleted)
Tanya [name], female    DOB: [birth year]    MRN: [id number]  Brief patient profile:  71  yo [place] in [place] since 1980 and new onset doe x 2015  and progressed since to point where can't even even walk 50 ft "sometimes"    History of Present Illness  07/05/2020  Pulmonary/ 1st office eval/ Tanya [name] / [name] 50 ft to office on day of 1st eval  Chief Complaint  Patient presents with   Pulmonary Consult    Referred by Tanya Pho, PA.  Pt c/o SOB x 2 years. She states she was winded walking from lobby to nearest exam room today.   Dyspnea:  Sometimes a  Little sob at rest and worse "when walk anywhere" s assoc cp or sweating / nausea  Uses treadmill once or twice a week using x 30 min s stopping at slt tilt (can't pace higher due to headache)  Cough: sometimes but not often and no assoc with sob Sleep: ok flat/ one pillow  SABA use: none  rec To get the most out of exercise, you need to be continuously aware that you are short of breath, but never out of breath, for 30 minutes daily.     If not improving what you will need is a cpst - please call in 6 weeks to schedule if not impoving.  Late ADD:   Chart review suggests also dx of possible gerd/lpr so rec give diet: GERD diet   And be sure to take protonix 40 Take 30-60 min before first meal of the day including the day of the cpst.   09/28/2020  f/u ov/Tanya [name] office/Tanya [name] re: unexplained sob/ fleeting cp and "really bad hb" not really sure what she takes for it. Chief Complaint  Patient presents with   Follow-up    Shortness of breath with exertion  Dyspnea:  No change Cough: none  Sleeping: not disturbing sleep  SABA use: none  02: none  CP on R ant comes and goes  "when is the last time you had it"  ?   A  Can't remember  How long does it last "maybe a second"  Rec Be sure you are taking Pantoprazole (protonix) 40 mg   Take  30-60 min before first meal of the day and add  Pepcid (famotidine)  20 mg one  after supper  until return to office  GERD diet/bed blocks I will let Tanya [name] know that you will need to be referred to a stomach doctor of her choice since you still have bad heartburn despite saying you are taking the right medications for it. If still having problems with breathing  after you finish your stomach problem evaluation then I will need to see you back with all your medications in hand to order to consider what the next step should be.    04/06/2022  f/u ov/Tanya [name] office/Tanya [name] re: *** maint on ***  No chief complaint on file.   Dyspnea:  *** Cough: *** Sleeping: *** SABA use: *** 02: *** Covid status: *** Lung cancer screening: ***   No obvious day to day or daytime variability or assoc excess/ purulent sputum or mucus plugs or hemoptysis or cp or chest tightness, subjective wheeze or overt sinus or hb symptoms.   *** without nocturnal  or early am exacerbation  of respiratory  c/o's or need for noct saba. Also denies any obvious fluctuation of symptoms with weather or environmental changes or other aggravating  or alleviating factors except as outlined above   No unusual exposure hx or h/o childhood pna/ asthma or knowledge of premature birth.  Current Allergies, Complete Past Medical History, Past Surgical History, Family History, and Social History were reviewed in [employer] record.  ROS  The following are not active complaints unless bolded Hoarseness, sore throat, dysphagia, dental problems, itching, sneezing,  nasal congestion or discharge of excess mucus or purulent secretions, ear ache,   fever, chills, sweats, unintended wt loss or wt gain, classically pleuritic or exertional cp,  orthopnea pnd or arm/hand swelling  or leg swelling, presyncope, palpitations, abdominal pain, anorexia, nausea, vomiting, diarrhea  or change in bowel habits or change in bladder habits, change in stools or change in urine, dysuria, hematuria,  rash,  arthralgias, visual complaints, headache, numbness, weakness or ataxia or problems with walking or coordination,  change in mood or  memory.        No outpatient medications have been marked as taking for the 04/06/22 encounter (Appointment) with Tanya Rockers, MD.                       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 s/p DESx2 to proximal LAD in 06/2018 b. cath in 04/2019 showing 40% mid-LAD ISR with no significant stenosis along LM, LCx, or RCA   Cystitis    Elevated liver enzymes    Fatty liver    GERD (gastroesophageal reflux disease)    Headache    migraine   Hyperlipidemia    Hypertension    Insomnia    Unspecified hypothyroidism    Vaginitis    Wears glasses    Wears hearing aid    B/L        Objective:    04/06/2022          ***  09/28/20 138 lb (62.6 kg)  07/22/20 136 lb 12.8 oz (62.1 kg)  07/21/20 135 lb (61.2 kg)    Vital signs reviewed  04/06/2022  - Note at rest 02 sats  ***% on ***   General appearance:    ***                      Assessment

## 2022-04-06 NOTE — Patient Instructions (Addendum)
Please remember to go to the lab department   for your tests - we will call you with the results when they are available.      Please remember to go to the  x-ray department  for your tests - we will call you with the results when they are available    No pulmonary follow needed unless directed by studies above that indicate a lung problem

## 2022-04-09 ENCOUNTER — Ambulatory Visit (INDEPENDENT_AMBULATORY_CARE_PROVIDER_SITE_OTHER): Payer: Medicare Other

## 2022-04-09 ENCOUNTER — Ambulatory Visit (HOSPITAL_COMMUNITY)
Admission: RE | Admit: 2022-04-09 | Discharge: 2022-04-09 | Disposition: A | Discharge status: Home / Self Care | Payer: Medicare Other | Source: Ambulatory Visit | Attending: Internal Medicine | Admitting: Internal Medicine

## 2022-04-09 ENCOUNTER — Encounter: Payer: Self-pay | Admitting: Internal Medicine

## 2022-04-09 VITALS — BP 117/64

## 2022-04-09 DIAGNOSIS — R0609 Other forms of dyspnea: Secondary | ICD-10-CM | POA: Diagnosis not present

## 2022-04-09 DIAGNOSIS — Z0000 Encounter for general adult medical examination without abnormal findings: Secondary | ICD-10-CM | POA: Diagnosis not present

## 2022-04-09 DIAGNOSIS — R0602 Shortness of breath: Secondary | ICD-10-CM | POA: Diagnosis not present

## 2022-04-09 LAB — BRAIN NATRIURETIC PEPTIDE: BNP: 49.1 pg/mL (ref 0.0–100.0)

## 2022-04-09 LAB — CBC WITH DIFFERENTIAL/PLATELET
Basophils Absolute: 0.1 10*3/uL (ref 0.0–0.2)
Basos: 2 %
EOS (ABSOLUTE): 0.2 10*3/uL (ref 0.0–0.4)
Eos: 4 %
Hematocrit: 38.8 % (ref 34.0–46.6)
Hemoglobin: 12.9 g/dL (ref 11.1–15.9)
Immature Grans (Abs): 0 10*3/uL (ref 0.0–0.1)
Immature Granulocytes: 0 %
Lymphocytes Absolute: 1.2 10*3/uL (ref 0.7–3.1)
Lymphs: 31 %
MCH: 31.2 pg (ref 26.6–33.0)
MCHC: 33.2 g/dL (ref 31.5–35.7)
MCV: 94 fL (ref 79–97)
Monocytes Absolute: 0.5 10*3/uL (ref 0.1–0.9)
Monocytes: 12 %
Neutrophils Absolute: 2 10*3/uL (ref 1.4–7.0)
Neutrophils: 51 %
Platelets: 303 10*3/uL (ref 150–450)
RBC: 4.14 x10E6/uL (ref 3.77–5.28)
RDW: 12.6 % (ref 11.7–15.4)
WBC: 3.9 10*3/uL (ref 3.4–10.8)

## 2022-04-09 LAB — TROPONIN T: Troponin T (Highly Sensitive): 7 ng/L (ref 0–14)

## 2022-04-09 LAB — BASIC METABOLIC PANEL
BUN/Creatinine Ratio: 17 (ref 12–28)
BUN: 13 mg/dL (ref 8–27)
CO2: 21 mmol/L (ref 20–29)
Calcium: 9.6 mg/dL (ref 8.7–10.3)
Chloride: 110 mmol/L — ABNORMAL HIGH (ref 96–106)
Creatinine, Ser: 0.78 mg/dL (ref 0.57–1.00)
Glucose: 95 mg/dL (ref 70–99)
Potassium: 4.1 mmol/L (ref 3.5–5.2)
Sodium: 144 mmol/L (ref 134–144)
eGFR: 81 mL/min/{1.73_m2} (ref >59)

## 2022-04-09 LAB — IGE: IgE (Immunoglobulin E), Serum: 564 IU/mL — ABNORMAL HIGH (ref 6–495)

## 2022-04-09 NOTE — Patient Instructions (Signed)
  Tanya [name] , Thank you for taking time to come for your Medicare Wellness Visit. I appreciate your ongoing commitment to your health goals. Please review the following plan we discussed and let me know if I can assist you in the future.   These are the goals we discussed:  Goals      Prevent falls        This is a list of the screening recommended for you and due dates:  Health Maintenance  Topic Date Due   Zoster (Shingles) Vaccine (2 of 2) 05/20/2019   COVID-19 Vaccine (4 - Booster for Moderna series) 11/23/2020   Flu Shot  05/01/2022   Colon Cancer Screening  02/08/2023   Tetanus Vaccine  07/18/2023   Mammogram  12/14/2023   Pneumonia Vaccine  Completed   DEXA scan (bone density measurement)  Completed   Hepatitis C Screening: USPSTF Recommendation to screen - Ages 73-79 yo.  Completed   HPV Vaccine  Aged Out

## 2022-04-09 NOTE — Progress Notes (Signed)
Called the pt and there was no answer- LMTCB    

## 2022-04-09 NOTE — Assessment & Plan Note (Signed)
Onset 2015 with variable CP  - PFT's  09/22/14   FEV1 2.17 (92 % ) ratio 0.83  p 9 % improvement from saba p 0 prior to study with DLCO  12.40 (54%) corrects to 3.95 (84%)  for alv volume and FV curve nl exp/ severe truncation insp loop   - LHC  04/24/2019  . 40% eccentric mid LAD in-stent restenosis. At completion of stent procedure in September 2020, there was residual stent deformity in this region. There is no high-grade obstruction in the LAD stent. The LAD is otherwise normal. . Normal left main . Normal circumflex . Normal RCA . Normal LV systolic function and end-diastolic pressure - Echo 3/76/2831 nl x for   diastolic dysfunction  -  51/04/6159   Walked RA  approx   500 ft  @ very fast pace  stopped due to  End of study , min  sob ,  sats still 96%   - 04/06/2022   Walked on RA   x  3   lap(s) =  approx 450  ft  @ very fast  pace, stopped due to end of study  with lowest 02 sats 98% but did c/o upper abd discomfort ? Worse with ex   - Allergy screen 04/06/2022 >  Eos 0. /  IgE  564  Symptoms are markedly disproportionate to objective findings and not clear to what extent this is actually a pulmonary  problem but pt does appear to have difficult to sort out respiratory symptoms of unknown origin for which  DDX  = almost all start with A and  include Adherence, Ace Inhibitors, Acid Reflux, Active Sinus Disease, Alpha 1 Antitripsin deficiency, Anxiety masquerading as Airways dz,  ABPA,  Allergy(esp in young), Aspiration (esp in elderly), Adverse effects of meds,  Active smoking or Vaping, A bunch of PE's/clot burden (a few small clots can't cause this syndrome unless there is already severe underlying pulm or vascular dz with poor reserve),  Anemia or thyroid disorder, plus two Bs  = Bronchiectasis and Beta blocker use..and one C= CHF     Adherence is always the initial "prime suspect" and is a multilayered concern that requires a "trust but verify" approach in every patient - starting with  knowing how to use medications, especially inhalers, correctly, keeping up with refills and understanding the fundamental difference between maintenance and prns vs those medications only taken for a very short course and then stopped and not refilled.  - difficult to be sure about this with language/cultural barriers apparent > rec return prn with all meds in hand using a trust but verify approach to confirm accurate Medication  Reconciliation The principal here is that until we are certain that the  patients are doing what we've asked, it makes no sense to ask them to do more.   ? Acid (or non-acid) GERD > always difficult to exclude as up to 75% of pts in some series report no assoc GI/ Heartburn symptoms> rec max (24h)  acid suppression and diet restrictions/ reviewed and instructions given in writing.  -  Other possibility for discomfort and variable sense of sob  = IBS esp since never reports pain when supine  but rec max rx for gerd first   ? Allergy/ asthma > lack of noct symptoms against but igE impressive and might explain variability of sob> refer to allergy   ? Anxiety/deconditioning/ > usually at the bottom of this list of usual suspects but should be much higher  on this pt's based on H and P and may interfere with adherence and also interpretation of response or lack thereof to symptom management which can be quite subjective.   Anemia/ thryoid disorders > ruled out   ? chf > nl bnp, cards w/u neg in past with last echo 12/16 /2022  Only mild MR/ no further w/u indicated based on my eval    Pulmonary f/u is prn p allergy eval complete   Each maintenance medication was reviewed in detail including emphasizing most importantly the difference between maintenance and prns and under what circumstances the prns are to be triggered using an action plan format where appropriate.  Total time for H and P, chart review, counseling,  directly observing portions of ambulatory 02 saturation  study/ and generating customized AVS unique to this office visit / same day charting = 36 min

## 2022-04-09 NOTE — Progress Notes (Signed)
Subjective:   Tanya [name] is a 71 y.o. female who presents for Medicare Annual (Subsequent) preventive examination.  Review of Systems     Cardiac Risk Factors include: advanced age (>32mn, >>54women);dyslipidemia;hypertension     Objective:    Today's Vitals   04/09/22 0903  BP: 117/64   There is no height or weight on file to calculate BMI.     04/09/2022    8:48 AM 04/05/2021   10:00 AM 01/18/2021    8:33 AM 03/28/2020   12:01 PM 12/21/2019    6:50 AM 11/23/2019    6:46 AM 09/01/2019    6:52 AM  Advanced Directives  Does Patient Have a Medical Advance Directive? No Yes No No No No Yes  Type of ASocial research officer, governmentLiving will     [place] will  Does patient want to make changes to medical advance directive?  No - Patient declined     No - Patient declined  Copy of [place] Chart?  No - copy requested     No - copy requested  Would patient like information on creating a medical advance directive? Yes (ED - Information included in AVS)  No - Patient declined No - Patient declined  No - Patient declined     Current Medications (verified) Outpatient Encounter Medications as of 04/09/2022  Medication Sig   acetaminophen (TYLENOL) 500 MG tablet Take 1,000 mg by mouth every 6 (six) hours as needed for moderate pain.   amLODipine (NORVASC) 10 MG tablet TAKE 1 TABLET BY MOUTH  DAILY   Ascorbic Acid (VITAMIN C) 1000 MG tablet Take 1,000 mg by mouth daily.   aspirin EC 81 MG tablet Take 81 mg by mouth daily. Swallow whole.   cholecalciferol (VITAMIN D3) 25 MCG (1000 UNIT) tablet Take 1,000 Units by mouth daily.   cloNIDine (CATAPRES) 0.3 MG tablet Take 1 tablet (0.3 mg total) by mouth at bedtime.   clopidogrel (PLAVIX) 75 MG tablet TAKE 1 TABLET BY MOUTH  DAILY   metoprolol succinate (TOPROL-XL) 25 MG 24 hr tablet Take 0.5 tablets (12.5 mg total) by mouth daily.   nitroGLYCERIN (NITROSTAT) 0.4 MG SL  tablet Place 1 tablet (0.4 mg total) under the tongue every 5 (five) minutes as needed.   omeprazole (PRILOSEC) 40 MG capsule TAKE 1 CAPSULE BY MOUTH  DAILY   Potassium Chloride ER 20 MEQ TBCR Take 1 tablet by mouth daily.   prednisoLONE acetate (PRED FORTE) 1 % ophthalmic suspension    rosuvastatin (CRESTOR) 40 MG tablet TAKE 1 TABLET BY MOUTH  DAILY   temazepam (RESTORIL) 15 MG capsule Take 1 capsule (15 mg total) by mouth at bedtime. (Patient not taking: Reported on 04/06/2022)   topiramate (TOPAMAX) 50 MG tablet Take 1 tablet (50 mg total) by mouth at bedtime.   No facility-administered encounter medications on file as of 04/09/2022.    Allergies (verified) Patient has no known allergies.   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 s/p DESx2 to proximal LAD in 06/2018 b. cath in 04/2019 showing 40% mid-LAD ISR with no significant stenosis along LM, LCx, or RCA   Cystitis    Depression    Elevated liver enzymes    Fatty liver    GERD (  gastroesophageal reflux disease)    Hard of hearing    Headache    migraine   Hyperlipidemia    Hypertension    Insomnia    Migraine    Osteoarthritis    Unspecified hypothyroidism    Vaginitis    Wears glasses    Wears hearing aid    B/L   Past Surgical History:  Procedure Laterality Date   BIOPSY N/A 01/18/2021   Procedure: BIOPSY;  Surgeon: Harvel Quale, MD;  [facility];  Service: Gastroenterology;  Laterality: N/A;   CATARACT EXTRACTION W/PHACO Right 05/08/2019   Procedure: CATARACT EXTRACTION PHACO AND INTRAOCULAR LENS PLACEMENT (IOC);  Surgeon: Baruch Goldmann, MD;  Location: [facility];  Service: Ophthalmology;  Laterality: Right;  CDE: 10.02   COLONOSCOPY N/A 02/08/2016   Procedure: COLONOSCOPY;  Surgeon: [name], MD;  [facility];  Service: Endoscopy;  Laterality: N/A;   1200 - moved to 11:45 - Ann to notify   CORONARY STENT INTERVENTION N/A 06/06/2018   Procedure: CORONARY STENT INTERVENTION;  Surgeon: [name], [name], MD;  Location: [facility];  Service: Cardiovascular;  Laterality: N/A;   ESOPHAGOGASTRODUODENOSCOPY (EGD) WITH PROPOFOL N/A 01/18/2021   Procedure: ESOPHAGOGASTRODUODENOSCOPY (EGD) WITH PROPOFOL;  Surgeon: Harvel Quale, MD;  [facility];  Service: Gastroenterology;  Laterality: N/A;  10:30AM   INTRAVASCULAR PRESSURE WIRE/FFR STUDY N/A 06/06/2018   Procedure: INTRAVASCULAR PRESSURE WIRE/FFR STUDY;  Surgeon: [name], [name], MD;  Location: [place] [facility];  Service: Cardiovascular;  Laterality: N/A;   IR ANGIO INTRA EXTRACRAN SEL COM CAROTID INNOMINATE BILAT MOD SED  09/01/2019   IR ANGIO INTRA EXTRACRAN SEL INTERNAL CAROTID UNI L MOD SED  12/21/2019   IR ANGIO VERTEBRAL SEL VERTEBRAL BILAT MOD SED  09/01/2019   IR ANGIOGRAM FOLLOW UP STUDY  12/21/2019   IR TRANSCATH/EMBOLIZ  12/21/2019   LEFT HEART CATH AND CORONARY ANGIOGRAPHY N/A 04/24/2019   Procedure: LEFT HEART CATH AND CORONARY ANGIOGRAPHY;  Surgeon: Belva Crome, MD;  Location: [facility];  Service: Cardiovascular;  Laterality: N/A;   LEFT HEART CATHETERIZATION WITH CORONARY ANGIOGRAM N/A 11/19/2014   Procedure: LEFT HEART CATHETERIZATION WITH CORONARY ANGIOGRAM;  Surgeon: [name], MD;  Location: [facility];  Service: Cardiovascular;  Laterality: N/A;   RADIOLOGY WITH ANESTHESIA N/A 11/23/2019   Procedure: Drucilla Schmidt;  Surgeon: Luanne Bras, MD;  Location: [place];  Service: Radiology;  Laterality: N/A;   RADIOLOGY WITH ANESTHESIA N/A 12/21/2019   Procedure: EMBOLIZATION;  Surgeon: Luanne Bras, MD;  Location: [place];  Service: Radiology;  Laterality: N/A;   RIGHT/LEFT HEART CATH AND CORONARY ANGIOGRAPHY N/A 06/06/2018   Procedure: RIGHT/LEFT HEART CATH AND CORONARY ANGIOGRAPHY;  Surgeon: [name], [name], MD;  Location: [place] [facility];   Service: Cardiovascular;  Laterality: N/A;   Family History  Problem Relation Age of Onset   Dementia Mother    Heart disease Mother        heart attack    Hypertension Mother    Heart attack Father    Heart disease Father        heart attack    Hypertension Father    Social History   Socioeconomic History   Marital status: Married    Spouse name: Richard   Number of children: 0   Years of education: 12   Highest education level: Not on file  Occupational History   Not on file  Tobacco Use   Smoking  status: Never    Passive exposure: Never   Smokeless tobacco: Never  Vaping Use   Vaping Use: Never used  Substance and Sexual Activity   Alcohol use: Never   Drug use: Never   Sexual activity: Not on file  Other Topics Concern   Not on file  Social History Narrative   Pt originally from the [place] been in [place] for 20 years  ,lives with husband   Caffeine coffee 1 c daily    Social Determinants of Health   Financial Resource Strain: Low Risk  (04/09/2022)   Overall Financial Resource Strain (CARDIA)    Difficulty of Paying Living Expenses: Not hard at all  Food Insecurity: No Food Insecurity (04/09/2022)   Hunger Vital Sign    Worried About Running Out of Food in the Last Year: Never true    Ran Out of Food in the Last Year: Never true  Transportation Needs: No Transportation Needs (04/09/2022)   PRAPARE - Hydrologist (Medical): No    Lack of Transportation (Non-Medical): No  Physical Activity: Insufficiently Active (04/09/2022)   Exercise Vital Sign    Days of Exercise per Week: 3 days    Minutes of Exercise per Session: 40 min  Stress: No Stress Concern Present (04/09/2022)   [place]    Feeling of Stress : Not at all  Social Connections: Moderately Integrated (04/09/2022)   Social Connection and Isolation Panel [NHANES]    Frequency of Communication with Friends and  Family: More than three times a week    Frequency of Social Gatherings with Friends and Family: Twice a week    Attends Religious Services: Never    Marine scientist or Organizations: Yes    Attends Archivist Meetings: Never    Marital Status: Married    Tobacco Counseling Counseling given: Not Answered   Clinical Intake:  Pre-visit preparation completed: Yes        Nutritional Status: BMI of 19-24  Normal  How often do you need to have someone help you when you read instructions, pamphlets, or other written materials from your doctor or pharmacy?: 1 - Never  Diabetic?no  Interpreter Needed?: No      Activities of Daily Living    04/09/2022    9:07 AM  In your present state of health, do you have any difficulty performing the following activities:  Hearing? 0  Vision? 0  Difficulty concentrating or making decisions? 0  Walking or climbing stairs? 0  Dressing or bathing? 0  Doing errands, shopping? 0  Preparing Food and eating ? N  Using the Toilet? N  In the past six months, have you accidently leaked urine? N  Do you have problems with loss of bowel control? N  Managing your Medications? N  Managing your Finances? N  Housekeeping or managing your Housekeeping? N    Patient Care Team: [name], MD as PCP - General (Family Medicine) [name] Alphonse Guild, MD as PCP - Cardiology (Cardiology)  Indicate any recent Medical Services you may have received from other than [facility] providers in the past year (date may be approximate).     Assessment:   This is a routine wellness examination for Nely.  Hearing/Vision screen No results found.  Dietary issues and exercise activities discussed: Current Exercise Habits: Home exercise routine, Time (Minutes): 30, Frequency (Times/Week): 3, Weekly Exercise (Minutes/Week): 90, Intensity: Moderate   Goals Addressed  None    Depression Screen    02/20/2022   10:49 AM 11/10/2021   10:54 AM  08/10/2021   11:30 AM 04/11/2021   10:57 AM 04/05/2021   10:03 AM 01/26/2021   10:10 AM 01/02/2021    9:49 AM  PHQ 2/9 Scores  PHQ - 2 Score 0 0 0 0 0 0 0  PHQ- 9 Score   0        Fall Risk    04/09/2022    9:07 AM 02/20/2022   10:48 AM 11/10/2021   10:54 AM 08/10/2021   11:30 AM 04/11/2021   10:57 AM  Fall Risk   Falls in the past year? 0 0 0 0 0  Number falls in past yr: 0 0 0  0  Injury with Fall? 0 0 0  0  Risk for fall due to :  No Fall Risks No Fall Risks  No Fall Risks  Follow up  Falls evaluation completed Falls evaluation completed  Falls evaluation completed    [place]:  Any stairs in or around the home? No  If so, are there any without handrails? No  Home free of loose throw rugs in walkways, pet beds, electrical cords, etc? Yes  Adequate lighting in your home to reduce risk of falls? Yes   ASSISTIVE DEVICES UTILIZED TO PREVENT FALLS:  Life alert? No  Use of a cane, walker or w/c? No  Grab bars in the bathroom? Yes  Shower chair or bench in shower? No  Elevated toilet seat or a handicapped toilet? No   TIMED UP AND GO:  Was the test performed? Yes .  Length of time to ambulate 10 feet: 6 sec.   Gait steady and fast without use of assistive device  Cognitive Function:    10/31/2021   10:22 AM 04/19/2021   10:34 AM  MMSE - Mini Mental State Exam  Not completed: Unable to complete   Orientation to time 2 4  Orientation to Place 3 3  Registration  2  Attention/ Calculation  0  Recall  0  Language- name 2 objects  2  Language- repeat  0  Language- follow 3 step command  3  Language- read & follow direction  1  Write a sentence  1  Copy design  0  Total score  16        04/09/2022    9:24 AM 04/05/2021   10:05 AM  6CIT Screen  What Year? 0 points 0 points  What month? 0 points 0 points  What time? 0 points 3 points  Count back from 20  0 points  Months in reverse 4 points 4 points  Repeat phrase 2 points 4 points   Total Score  11 points    Immunizations Immunization History  Administered Date(s) Administered   Fluad Quad(high Dose 65+) 06/16/2019, 08/10/2021   Influenza Whole 07/05/2008   Influenza,inj,Quad PF,6+ Mos 07/17/2013, 08/01/2014, 05/24/2016, 05/19/2020   Influenza-Unspecified 06/10/2017   Moderna Sars-Covid-2 Vaccination 12/07/2019, 01/05/2020, 09/28/2020   Pneumococcal Conjugate-13 11/08/2014   Pneumococcal Polysaccharide-23 09/06/2004, 03/30/2016   Td 01/21/2003   Tdap 07/17/2013   Zoster Recombinat (Shingrix) 03/25/2019   Zoster, Live 01/12/2011    TDAP status: Up to date  Flu Vaccine status: Up to date  Pneumococcal vaccine status: Up to date  Covid-19 vaccine status: Completed vaccines needs one more   Qualifies for Shingles Vaccine? Yes   Zostavax completed No   Shingrix Completed?:  Yes needs one more vaccine   Screening Tests Health Maintenance  Topic Date Due   Zoster Vaccines- Shingrix (2 of 2) 05/20/2019   COVID-19 Vaccine (4 - Booster for Moderna series) 11/23/2020   INFLUENZA VACCINE  05/01/2022   COLONOSCOPY (Pts 45-51yr Insurance coverage will need to be confirmed)  02/08/2023   TETANUS/TDAP  07/18/2023   MAMMOGRAM  12/14/2023   Pneumonia Vaccine 72 Years old  Completed   DEXA SCAN  Completed   Hepatitis C Screening  Completed   HPV VACCINES  Aged Out    Health Maintenance  Health Maintenance Due  Topic Date Due   Zoster Vaccines- Shingrix (2 of 2) 05/20/2019   COVID-19 Vaccine (4 - Booster for Moderna series) 11/23/2020    Colorectal cancer screening: Type of screening: Colonoscopy. Completed 2017. Repeat every 7 years  Mammogram status: Completed 2023. Repeat every year  Bone Density status: Completed yes. Results reflect: Bone density results: OSTEOPENIA. Repeat every 2 years.  Lung Cancer Screening: (Low Dose CT Chest recommended if Age 684-80years, 30 pack-year currently smoking OR have quit w/in 15years.) does not qualify.   Lung  Cancer Screening Referral: na  Additional Screening:  Hepatitis C Screening: does qualify; Completed yes  Vision Screening: Recommended annual ophthalmology exams for early detection of glaucoma and other disorders of the eye. Is the patient up to date with their annual eye exam?  No  Who is the provider or what is the name of the office in which the patient attends annual eye exams? [facility] [place] If pt is not established with a provider, would they like to be referred to a provider to establish care? No .   Dental Screening: Recommended annual dental exams for proper oral hygiene  Community Resource Referral / Chronic Care Management: CRR required this visit?  No   CCM required this visit?  No      Plan:     I have personally reviewed and noted the following in the patient's chart:   Medical and social history Use of alcohol, tobacco or illicit drugs  Current medications and supplements including opioid prescriptions.  Functional ability and status Nutritional status Physical activity Advanced directives List of other physicians Hospitalizations, surgeries, and ER visits in previous 12 months Vitals Screenings to include cognitive, depression, and falls Referrals and appointments  In addition, I have reviewed and discussed with patient certain preventive protocols, quality metrics, and best practice recommendations. A written personalized care plan for preventive services as well as general preventive health recommendations were provided to patient.     BEual Fines LPN   70/96/2836  Nurse Notes:  Ms. SBlann, Thank you for taking time to come for your Medicare Wellness Visit. I appreciate your ongoing commitment to your health goals. Please review the following plan we discussed and let me know if I can assist you in the future.   These are the goals we discussed:  Goals      Prevent falls        This is a list of the screening recommended for you and due dates:   Health Maintenance  Topic Date Due   Zoster (Shingles) Vaccine (2 of 2) 05/20/2019   COVID-19 Vaccine (4 - Booster for Moderna series) 11/23/2020   Flu Shot  05/01/2022   Colon Cancer Screening  02/08/2023   Tetanus Vaccine  07/18/2023   Mammogram  12/14/2023   Pneumonia Vaccine  Completed   DEXA scan (bone density measurement)  Completed  Hepatitis C Screening: USPSTF Recommendation to screen - Ages 15-79 yo.  Completed   HPV Vaccine  Aged Out

## 2022-04-10 ENCOUNTER — Ambulatory Visit: Payer: Medicare Other

## 2022-04-13 ENCOUNTER — Other Ambulatory Visit: Payer: Self-pay | Admitting: Internal Medicine

## 2022-04-13 DIAGNOSIS — R768 Other specified abnormal immunological findings in serum: Secondary | ICD-10-CM

## 2022-04-13 NOTE — Progress Notes (Signed)
LMTCB

## 2022-04-13 NOTE — Progress Notes (Signed)
Spoke with pt and notified of results per Dr. Melvyn Novas. Pt verbalized understanding and denied any questions. Pt agreeable to allergy referral and this was placed.

## 2022-04-26 ENCOUNTER — Other Ambulatory Visit: Payer: Self-pay | Admitting: Family Medicine

## 2022-04-26 ENCOUNTER — Telehealth: Payer: Self-pay | Admitting: Family Medicine

## 2022-04-26 ENCOUNTER — Telehealth: Payer: Self-pay

## 2022-04-26 MED ORDER — TEMAZEPAM 15 MG PO CAPS: 15.0000 mg | ORAL_CAPSULE | Freq: Every evening | ORAL | 2 refills | Status: DC | PRN

## 2022-04-26 NOTE — Progress Notes (Signed)
Sent to [facility], [address]

## 2022-04-26 NOTE — Telephone Encounter (Signed)
Patient spouse came by office.  Patient needs refill on temazepam (RESTORIL) 15 MG capsule   Patient is completely out of med  Suzie Portela is currently out of med can send to [facility] on [address]

## 2022-04-26 NOTE — Telephone Encounter (Signed)
Patient called need refill,  temazepam (RESTORIL) 15 MG capsule  [facility] is out of this medicine, ask to send to [facility] on [address]

## 2022-05-08 ENCOUNTER — Other Ambulatory Visit: Payer: Self-pay

## 2022-05-08 ENCOUNTER — Telehealth: Payer: Self-pay

## 2022-05-08 MED ORDER — PAROXETINE HCL 10 MG PO TABS: 10.0000 mg | ORAL_TABLET | Freq: Every day | ORAL | 1 refills | Status: DC

## 2022-05-08 NOTE — Telephone Encounter (Signed)
Refill sent. It had dropped off the list incidentally due to the end date approaching

## 2022-05-08 NOTE — Telephone Encounter (Signed)
Patient spouse walk in said needs refill medicine. Did not see on med list but was listed on bottle given by Dr Moshe Cipro.   Paroxetine 10 mg   Pharmacy: [facility]

## 2022-06-08 ENCOUNTER — Encounter: Payer: Self-pay | Admitting: Cardiology

## 2022-06-08 ENCOUNTER — Ambulatory Visit: Payer: Medicare Other | Attending: Cardiology | Admitting: Cardiology

## 2022-06-08 VITALS — BP 122/58 | HR 68 | Ht 64.0 in | Wt 138.0 lb

## 2022-06-08 DIAGNOSIS — I10 Essential (primary) hypertension: Secondary | ICD-10-CM | POA: Diagnosis not present

## 2022-06-08 DIAGNOSIS — I2510 Atherosclerotic heart disease of native coronary artery without angina pectoris: Secondary | ICD-10-CM | POA: Diagnosis not present

## 2022-06-08 DIAGNOSIS — M7989 Other specified soft tissue disorders: Secondary | ICD-10-CM

## 2022-06-08 DIAGNOSIS — E782 Mixed hyperlipidemia: Secondary | ICD-10-CM

## 2022-06-08 DIAGNOSIS — E7849 Other hyperlipidemia: Secondary | ICD-10-CM | POA: Diagnosis not present

## 2022-06-08 MED ORDER — AMLODIPINE BESYLATE 5 MG PO TABS: 5.0000 mg | ORAL_TABLET | Freq: Every day | ORAL | 3 refills | Status: DC

## 2022-06-08 NOTE — Progress Notes (Signed)
Clinical Summary Ms. Koone is a 71 y.o.female seen today for follow up of the following medical problems.    1. CAD - s/p DESx2 to proximal LAD in 06/2018 with cath in 04/2019 showing 40% mid-LAD ISR with no significant stenosis along LM, LCx, or RCA. LVEDP 10    - no recent chest pains - compliant with meds   2. HTN -  Compliant with meds   3. Hyperlipidemia - complant with statin  Jan 2023 TC 120 TG 49 HDL 62 LDL 50   4. DOE - followed by pulmonary - working on regular exercise to improve conditioning.    5. 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 s/p DESx2 to proximal LAD in 06/2018 b. cath in 04/2019 showing 40% mid-LAD ISR with no significant stenosis along LM, LCx, or RCA   Cystitis    Depression    Elevated liver enzymes    Fatty liver    GERD (gastroesophageal reflux disease)    Hard of hearing    Headache    migraine   Hyperlipidemia    Hypertension    Insomnia    Migraine    Osteoarthritis    Unspecified hypothyroidism    Vaginitis    Wears glasses    Wears hearing aid    B/L     No Known Allergies   Current Outpatient Medications  Medication Sig Dispense Refill   acetaminophen (TYLENOL) 500 MG tablet Take 1,000 mg by mouth every 6 (six) hours as needed for moderate pain.     amLODipine (NORVASC) 10 MG tablet TAKE 1 TABLET BY MOUTH  DAILY 90 tablet 3   Ascorbic Acid (VITAMIN C) 1000 MG tablet Take 1,000 mg by mouth daily.     aspirin EC 81 MG tablet Take 81 mg by mouth daily. Swallow whole.     cholecalciferol (VITAMIN D3) 25 MCG (1000 UNIT) tablet Take 1,000 Units by mouth daily.     cloNIDine (CATAPRES) 0.3 MG tablet Take 1  tablet (0.3 mg total) by mouth at bedtime. 30 tablet 5   clopidogrel (PLAVIX) 75 MG tablet TAKE 1 TABLET BY MOUTH  DAILY 90 tablet 3   metoprolol succinate (TOPROL-XL) 25 MG 24 hr tablet Take 0.5 tablets (12.5 mg total) by mouth daily. 45 tablet 3   nitroGLYCERIN (NITROSTAT) 0.4 MG SL tablet Place 1 tablet (0.4 mg total) under the tongue every 5 (five) minutes as needed. 25 tablet 3   omeprazole (PRILOSEC) 40 MG capsule TAKE 1 CAPSULE BY MOUTH  DAILY 90 capsule 3   PARoxetine (PAXIL) 10 MG tablet Take 1 tablet (10 mg total) by mouth daily. 90 tablet 1   Potassium Chloride ER 20 MEQ TBCR Take 1 tablet by mouth daily. 90 tablet 3   prednisoLONE acetate (PRED FORTE) 1 % ophthalmic suspension      rosuvastatin (CRESTOR) 40 MG tablet TAKE 1 TABLET BY MOUTH  DAILY 90 tablet 3   temazepam (RESTORIL) 15 MG capsule Take 1 capsule (15 mg total) by mouth at bedtime as needed for sleep. 30 capsule 2  topiramate (TOPAMAX) 50 MG tablet Take 1 tablet (50 mg total) by mouth at bedtime. 90 tablet 4   No current facility-administered medications for this visit.     Past Surgical History:  Procedure Laterality Date   BIOPSY N/A 01/18/2021   Procedure: BIOPSY;  Surgeon: Harvel Quale, MD;  [facility];  Service: Gastroenterology;  Laterality: N/A;   CATARACT EXTRACTION W/PHACO Right 05/08/2019   Procedure: CATARACT EXTRACTION PHACO AND INTRAOCULAR LENS PLACEMENT (IOC);  Surgeon: Baruch Goldmann, MD;  Location: [facility];  Service: Ophthalmology;  Laterality: Right;  CDE: 10.02   COLONOSCOPY N/A 02/08/2016   Procedure: COLONOSCOPY;  Surgeon: [name], MD;  [facility];  Service: Endoscopy;  Laterality: N/A;  1200 - moved to 11:45 - Ann to notify   CORONARY STENT INTERVENTION N/A 06/06/2018   Procedure: CORONARY STENT INTERVENTION;  Surgeon: [name], [name], MD;  Location: [place] [facility];  Service: Cardiovascular;  Laterality: N/A;   ESOPHAGOGASTRODUODENOSCOPY (EGD) WITH  PROPOFOL N/A 01/18/2021   Procedure: ESOPHAGOGASTRODUODENOSCOPY (EGD) WITH PROPOFOL;  Surgeon: Harvel Quale, MD;  [facility];  Service: Gastroenterology;  Laterality: N/A;  10:30AM   INTRAVASCULAR PRESSURE WIRE/FFR STUDY N/A 06/06/2018   Procedure: INTRAVASCULAR PRESSURE WIRE/FFR STUDY;  Surgeon: [name], [name], MD;  Location: [place] [facility];  Service: Cardiovascular;  Laterality: N/A;   IR ANGIO INTRA EXTRACRAN SEL COM CAROTID INNOMINATE BILAT MOD SED  09/01/2019   IR ANGIO INTRA EXTRACRAN SEL INTERNAL CAROTID UNI L MOD SED  12/21/2019   IR ANGIO VERTEBRAL SEL VERTEBRAL BILAT MOD SED  09/01/2019   IR ANGIOGRAM FOLLOW UP STUDY  12/21/2019   IR TRANSCATH/EMBOLIZ  12/21/2019   LEFT HEART CATH AND CORONARY ANGIOGRAPHY N/A 04/24/2019   Procedure: LEFT HEART CATH AND CORONARY ANGIOGRAPHY;  Surgeon: Belva Crome, MD;  Location: [facility];  Service: Cardiovascular;  Laterality: N/A;   LEFT HEART CATHETERIZATION WITH CORONARY ANGIOGRAM N/A 11/19/2014   Procedure: LEFT HEART CATHETERIZATION WITH CORONARY ANGIOGRAM;  Surgeon: [name], MD;  Location: [facility];  Service: Cardiovascular;  Laterality: N/A;   RADIOLOGY WITH ANESTHESIA N/A 11/23/2019   Procedure: Drucilla Schmidt;  Surgeon: Luanne Bras, MD;  Location: [place];  Service: Radiology;  Laterality: N/A;   RADIOLOGY WITH ANESTHESIA N/A 12/21/2019   Procedure: EMBOLIZATION;  Surgeon: Luanne Bras, MD;  Location: [facility];  Service: Radiology;  Laterality: N/A;   RIGHT/LEFT HEART CATH AND CORONARY ANGIOGRAPHY N/A 06/06/2018   Procedure: RIGHT/LEFT HEART CATH AND CORONARY ANGIOGRAPHY;  Surgeon: [name], [name], MD;  Location: [place] [facility];  Service: Cardiovascular;  Laterality: N/A;     No Known Allergies    Family History  Problem Relation Age of Onset   Dementia Mother    Heart disease Mother        heart attack    Hypertension Mother    Heart attack Father    Heart disease Father         heart attack    Hypertension Father      Social History Ms. Dolloff reports that she has never smoked. She has never been exposed to tobacco smoke. She has never used smokeless tobacco. Ms. First reports no history of alcohol use.   Review of Systems CONSTITUTIONAL: No weight loss, fever, chills, weakness or fatigue.  HEENT: Eyes: No visual loss, blurred vision, double vision or yellow sclerae.No hearing loss, sneezing, congestion, runny nose or sore throat.  SKIN: No rash  or itching.  CARDIOVASCULAR: per hpi RESPIRATORY: No shortness of breath, cough or sputum.  GASTROINTESTINAL: No anorexia, nausea, vomiting or diarrhea. No abdominal pain or blood.  GENITOURINARY: No burning on urination, no polyuria NEUROLOGICAL: No headache, dizziness, syncope, paralysis, ataxia, numbness or tingling in the extremities. No change in bowel or bladder control.  MUSCULOSKELETAL: No muscle, back pain, joint pain or stiffness.  LYMPHATICS: No enlarged nodes. No history of splenectomy.  PSYCHIATRIC: No history of depression or anxiety.  ENDOCRINOLOGIC: No reports of sweating, cold or heat intolerance. No polyuria or polydipsia.  Marland Kitchen   Physical Examination Today's Vitals   06/08/22 1128  BP: (!) 122/58  Pulse: 68  SpO2: 97%  Weight: 138 lb (62.6 kg)  Height: '5\' 4"'$  (1.626 m)   Body mass index is 23.69 kg/m.  Gen: resting comfortably, no acute distress HEENT: no scleral icterus, pupils equal round and reactive, no palptable cervical adenopathy,  CV: RRR, no m/r/g no jvd Resp: Clear to auscultation bilaterally GI: abdomen is soft, non-tender, non-distended, normal bowel sounds, no hepatosplenomegaly MSK: extremities are warm, no edema.  Skin: warm, no rash Neuro:  no focal deficits Psych: appropriate affect   Diagnostic Studies Cardiac Catheterization: 05-04-19 40% eccentric mid LAD in-stent restenosis.  At completion of stent procedure in September 2020, there was residual stent  deformity in this region.  There is no high-grade obstruction in the LAD stent.  The LAD is otherwise normal. Normal left main Normal circumflex Normal RCA Normal LV systolic function and end-diastolic pressure   RECOMMENDATIONS:   Dyspnea cannot be explained by findings on today's study based on absence of significant coronary obstruction and normal LV hemodynamics. Continue dual antiplatelet therapy beyond 12 months due to overlapping stents in the LAD.   Echocardiogram: 06/2019 IMPRESSIONS     1. Left ventricular ejection fraction, by visual estimation, is 55 to  60%. The left ventricle has normal function. There is no left ventricular  hypertrophy.   2. Left ventricular diastolic Doppler parameters are consistent with  impaired relaxation pattern of LV diastolic filling.   3. Global right ventricle has normal systolic function.The right  ventricular size is normal. No increase in right ventricular wall  thickness.   4. Left atrial size was normal.   5. Right atrial size was normal.   6. The mitral valve is normal in structure. Trace mitral valve  regurgitation. No evidence of mitral stenosis.   7. The tricuspid valve is normal in structure. Tricuspid valve  regurgitation was not visualized by color flow Doppler.   8. The aortic valve is tricuspid Aortic valve regurgitation was not  visualized by color flow Doppler. Mild aortic valve sclerosis without  stenosis.   9. The pulmonic valve was not well visualized. Pulmonic valve  regurgitation is not visualized by color flow Doppler.  10. Normal pulmonary artery systolic pressure.  11. The inferior vena cava IVC is small, suggesting low RA pressure and  hypovolemia.    Long-term Monitor: 01/2020 Sinus rhythm with rare PACs and PVCs and short bursts of SVT (possibly atrial tachycardia).    Assessment and Plan  1. CAD - no significant symptoms, continue current meds       2. HTN - at goal. With chronic leg swelling  without clear cause try lowering norvasc to '5mg'$  daily, could try stopping at f/u if ongoing.      3. Hyperlipidemia - at goal, continue current meds   5. Left leg swelling - seen by vascular - no clear  etiology, try weaning and potentially stopping norvasc      Arnoldo Lenis, M.D.

## 2022-06-08 NOTE — Patient Instructions (Signed)
Medication Instructions:  Your physician has recommended you make the following change in your medication:   -Decrease Norvasc (Amlodipine) to 5 mg tablets daily   Labwork: None  Testing/Procedures: None  Follow-Up: Follow up with Dr. [name] in 6 months.   Any Other Special Instructions Will Be Listed Below (If Applicable).     If you need a refill on your cardiac medications before your next appointment, please call your pharmacy.

## 2022-06-09 LAB — CMP14+EGFR
ALT: 14 IU/L (ref 0–32)
AST: 20 IU/L (ref 0–40)
Albumin/Globulin Ratio: 1.6 (ref 1.2–2.2)
Albumin: 4.1 g/dL (ref 3.8–4.8)
Alkaline Phosphatase: 62 IU/L (ref 44–121)
BUN/Creatinine Ratio: 18 (ref 12–28)
BUN: 12 mg/dL (ref 8–27)
Bilirubin Total: 0.4 mg/dL (ref 0.0–1.2)
CO2: 20 mmol/L (ref 20–29)
Calcium: 9.4 mg/dL (ref 8.7–10.3)
Chloride: 109 mmol/L — ABNORMAL HIGH (ref 96–106)
Creatinine, Ser: 0.68 mg/dL (ref 0.57–1.00)
Globulin, Total: 2.5 g/dL (ref 1.5–4.5)
Glucose: 130 mg/dL — ABNORMAL HIGH (ref 70–99)
Potassium: 3.9 mmol/L (ref 3.5–5.2)
Sodium: 143 mmol/L (ref 134–144)
Total Protein: 6.6 g/dL (ref 6.0–8.5)
eGFR: 92 mL/min/{1.73_m2} (ref >59)

## 2022-06-09 LAB — LIPID PANEL
Chol/HDL Ratio: 2 ratio (ref 0.0–4.4)
Cholesterol, Total: 120 mg/dL (ref 100–199)
HDL: 61 mg/dL (ref >39)
LDL Chol Calc (NIH): 42 mg/dL (ref 0–99)
Triglycerides: 87 mg/dL (ref 0–149)
VLDL Cholesterol Cal: 17 mg/dL (ref 5–40)

## 2022-06-12 ENCOUNTER — Encounter: Payer: Self-pay | Admitting: Family Medicine

## 2022-06-12 ENCOUNTER — Ambulatory Visit (INDEPENDENT_AMBULATORY_CARE_PROVIDER_SITE_OTHER): Payer: Medicare Other | Admitting: Family Medicine

## 2022-06-12 VITALS — BP 119/68 | HR 57 | Ht 64.0 in | Wt 138.1 lb

## 2022-06-12 DIAGNOSIS — R7302 Impaired glucose tolerance (oral): Secondary | ICD-10-CM | POA: Diagnosis not present

## 2022-06-12 DIAGNOSIS — W19XXXA Unspecified fall, initial encounter: Secondary | ICD-10-CM | POA: Insufficient documentation

## 2022-06-12 DIAGNOSIS — Y92009 Unspecified place in unspecified non-institutional (private) residence as the place of occurrence of the external cause: Secondary | ICD-10-CM

## 2022-06-12 DIAGNOSIS — E559 Vitamin D deficiency, unspecified: Secondary | ICD-10-CM | POA: Diagnosis not present

## 2022-06-12 DIAGNOSIS — E7849 Other hyperlipidemia: Secondary | ICD-10-CM

## 2022-06-12 DIAGNOSIS — I10 Essential (primary) hypertension: Secondary | ICD-10-CM

## 2022-06-12 DIAGNOSIS — Z23 Encounter for immunization: Secondary | ICD-10-CM | POA: Diagnosis not present

## 2022-06-12 LAB — POCT GLYCOSYLATED HEMOGLOBIN (HGB A1C): HbA1c, POC (prediabetic range): 5.8 % (ref 5.7–6.4)

## 2022-06-12 NOTE — Progress Notes (Signed)
Tanya [name]     MRN: [id number]      DOB: 02-Mar-1951   HPI Tanya [name] is here for follow up and re-evaluation of chronic medical conditions, medication management and review of any available recent lab and radiology data.  Preventive health is updated, specifically  Cancer screening and Immunization.   Questions or concerns regarding consultations or procedures which the PT has had in the interim are  addressed. The PT denies any adverse reactions to current medications since the last visit.  Fell 2 wqeeks ago and bruised her left thigh, no open wound, has been walking with no difficulty since that time C/o intermittent neck pain  has both arthritis and disc disease in he neck ROS Denies recent fever or chills. Denies sinus pressure, nasal congestion, ear pain or sore throat. Denies chest congestion, productive cough or wheezing. Denies chest pains, palpitations and leg swelling Denies abdominal pain, nausea, vomiting,diarrhea or constipation.   Denies dysuria, frequency, hesitancy or incontinence. . Denies headaches, seizures, numbness, or tingling. Denies depression, anxiety or insomnia. Denies skin break down or rash.   PE  BP 119/68 (BP Location: Right Arm, Patient Position: Sitting, Cuff Size: Normal)   Pulse (!) 57   Ht '5\' 4"'$  (1.626 m)   Wt 138 lb 1.9 oz (62.7 kg)   SpO2 98%   BMI 23.71 kg/m   Patient alert and oriented and in no cardiopulmonary distress.  HEENT: No facial asymmetry, EOMI,     Neck supple .  Chest: Clear to auscultation bilaterally.  CVS: S1, S2 no murmurs, no S3.Regular rate.  ABD: Soft non tender.   Ext: No edema  MS: Adequate ROM spine, shoulders, hips and knees.  Skin: Intact, bruise to lateral left thigh, extensive, no skin breakdown  Psych: Good eye contact, normal affect. Memory intact not anxious or depressed appearing.  CNS: CN 2-12 intact, power,  normal throughout.no focal deficits noted.   Assessment & Plan  Fall as  cause of accidental injury in home as place of occurrence Extensive bruise to left thigh, lateral aspect , pt on plavix which will cause bruise to persist for a longer period, no bony injury as weight bearing wih no pain, no palpable hematoma in area of bruise  Essential hypertension Controlled, no change in medication DASH diet and commitment to daily physical activity for a minimum of 30 minutes discussed and encouraged, as a part of hypertension management. The importance of attaining a healthy weight is also discussed.     06/12/2022   10:07 AM 06/08/2022   11:28 AM 04/09/2022    9:03 AM 04/06/2022    8:59 AM 04/05/2022    8:43 AM 02/20/2022   10:47 AM 12/05/2021   10:57 AM  BP/Weight  Systolic BP 735 329 924 268 341 962 229  Diastolic BP 68 58 64 80 68 69 76  Wt. (Lbs) 138.12 138  141.2 139.4 138.04 135.8  BMI 23.71 kg/m2 23.69 kg/m2  24.24 kg/m2 23.93 kg/m2 23.69 kg/m2 23.31 kg/m2       Hyperlipemia Hyperlipidemia:Low fat diet discussed and encouraged.   Lipid Panel  Lab Results  Component Value Date   CHOL 120 06/08/2022   HDL 61 06/08/2022   LDLCALC 42 06/08/2022   TRIG 87 06/08/2022   CHOLHDL 2.0 06/08/2022     Controlled, no change in medication   IGT (impaired glucose tolerance) Patient educated about the importance of limiting  Carbohydrate intake , the need to commit to daily physical activity for  a minimum of 30 minutes , and to commit weight loss. The fact that changes in all these areas will reduce or eliminate all together the development of diabetes is stressed.      Latest Ref Rng & Units 06/12/2022   10:44 AM 06/08/2022   10:23 AM 04/06/2022    9:36 AM 11/10/2021   11:42 AM 10/30/2021   10:25 AM  Diabetic Labs  HbA1c 5.7 - 6.4 % 5.8       Chol 100 - 199 mg/dL  120    120   HDL >39 mg/dL  61    62   Calc LDL 0 - 99 mg/dL  42    46   Triglycerides 0 - 149 mg/dL  87    49   Creatinine 0.57 - 1.00 mg/dL  0.68  0.78  0.58        06/12/2022   10:07 AM  06/08/2022   11:28 AM 04/09/2022    9:03 AM 04/06/2022    8:59 AM 04/05/2022    8:43 AM 02/20/2022   10:47 AM 12/05/2021   10:57 AM  BP/Weight  Systolic BP 779 390 300 923 300 762 263  Diastolic BP 68 58 64 80 68 69 76  Wt. (Lbs) 138.12 138  141.2 139.4 138.04 135.8  BMI 23.71 kg/m2 23.69 kg/m2  24.24 kg/m2 23.93 kg/m2 23.69 kg/m2 23.31 kg/m2       No data to display

## 2022-06-12 NOTE — Assessment & Plan Note (Signed)
Extensive bruise to left thigh, lateral aspect , pt on plavix which will cause bruise to persist for a longer period, no bony injury as weight bearing wih no pain, no palpable hematoma in area of bruise

## 2022-06-12 NOTE — Patient Instructions (Addendum)
Annual exam 2 /11 or after, call if you need me sooner  Flu vaccine today  GlycoHb in office today, expalin result if less tha 6.5 she is not diabetic, still needs to watch her sweets and starches  PLease be careful, no more falls!  Your left thigh is bruised where you fell 2 weeks ago, you have no broken bones , and no blood clot. Bruise will be present for next 4 to 6 weeks, since you are  on a blood thinner this is making  the bruise last longer  Nurse pls send to [facility]  , [place] for shingrix info, has ahd one vaccine this year states next due in Jan  Please get coovid booster and shingrix #2  Fasting cBC, lipid, cmp and EGFR, tSH, vit D  first week in Feb and HBA1c  It is important that you exercise regularly at least 30 minutes 5 times a week. If you develop chest pain, have severe difficulty breathing, or feel very tired, stop exercising immediately and seek medical attention  Thanks for choosing [facility], we consider it a privelige to serve you.

## 2022-06-12 NOTE — Assessment & Plan Note (Signed)
Hyperlipidemia:Low fat diet discussed and encouraged.   Lipid Panel  Lab Results  Component Value Date   CHOL 120 06/08/2022   HDL 61 06/08/2022   LDLCALC 42 06/08/2022   TRIG 87 06/08/2022   CHOLHDL 2.0 06/08/2022     Controlled, no change in medication

## 2022-06-12 NOTE — Assessment & Plan Note (Signed)
Patient educated about the importance of limiting  Carbohydrate intake , the need to commit to daily physical activity for a minimum of 30 minutes , and to commit weight loss. The fact that changes in all these areas will reduce or eliminate all together the development of diabetes is stressed.      Latest Ref Rng & Units 06/12/2022   10:44 AM 06/08/2022   10:23 AM 04/06/2022    9:36 AM 11/10/2021   11:42 AM 10/30/2021   10:25 AM  Diabetic Labs  HbA1c 5.7 - 6.4 % 5.8       Chol 100 - 199 mg/dL  120    120   HDL >39 mg/dL  61    62   Calc LDL 0 - 99 mg/dL  42    46   Triglycerides 0 - 149 mg/dL  87    49   Creatinine 0.57 - 1.00 mg/dL  0.68  0.78  0.58        06/12/2022   10:07 AM 06/08/2022   11:28 AM 04/09/2022    9:03 AM 04/06/2022    8:59 AM 04/05/2022    8:43 AM 02/20/2022   10:47 AM 12/05/2021   10:57 AM  BP/Weight  Systolic BP 643 329 518 841 660 630 160  Diastolic BP 68 58 64 80 68 69 76  Wt. (Lbs) 138.12 138  141.2 139.4 138.04 135.8  BMI 23.71 kg/m2 23.69 kg/m2  24.24 kg/m2 23.93 kg/m2 23.69 kg/m2 23.31 kg/m2       No data to display

## 2022-06-12 NOTE — Assessment & Plan Note (Signed)
Controlled, no change in medication DASH diet and commitment to daily physical activity for a minimum of 30 minutes discussed and encouraged, as a part of hypertension management. The importance of attaining a healthy weight is also discussed.     06/12/2022   10:07 AM 06/08/2022   11:28 AM 04/09/2022    9:03 AM 04/06/2022    8:59 AM 04/05/2022    8:43 AM 02/20/2022   10:47 AM 12/05/2021   10:57 AM  BP/Weight  Systolic BP 432 003 794 446 190 122 241  Diastolic BP 68 58 64 80 68 69 76  Wt. (Lbs) 138.12 138  141.2 139.4 138.04 135.8  BMI 23.71 kg/m2 23.69 kg/m2  24.24 kg/m2 23.93 kg/m2 23.69 kg/m2 23.31 kg/m2

## 2022-07-10 ENCOUNTER — Other Ambulatory Visit: Payer: Self-pay

## 2022-07-10 ENCOUNTER — Telehealth: Payer: Self-pay

## 2022-07-10 MED ORDER — POTASSIUM CHLORIDE ER 20 MEQ PO TBCR: 1.0000 | EXTENDED_RELEASE_TABLET | Freq: Every day | ORAL | 3 refills | Status: DC

## 2022-07-10 NOTE — Telephone Encounter (Signed)
Refills sent

## 2022-07-10 NOTE — Telephone Encounter (Signed)
Patient need med refill Potassium Chloride ER [place], [place] - [address]  [address] [place], [place] [address]  Phone:  [phone]  Fax:  [phone]

## 2022-07-12 ENCOUNTER — Other Ambulatory Visit: Payer: Self-pay | Admitting: Family Medicine

## 2022-08-27 ENCOUNTER — Telehealth: Payer: Self-pay | Admitting: Family Medicine

## 2022-08-27 ENCOUNTER — Other Ambulatory Visit: Payer: Self-pay | Admitting: Family Medicine

## 2022-08-27 NOTE — Telephone Encounter (Signed)
Please advise 

## 2022-08-27 NOTE — Telephone Encounter (Signed)
Need med refill   temazepam (RESTORIL) 15 MG capsule [[id number]]   Pharmacy: [facility]

## 2022-08-28 MED ORDER — TEMAZEPAM 15 MG PO CAPS: 15.0000 mg | ORAL_CAPSULE | Freq: Every evening | ORAL | 3 refills | Status: DC | PRN

## 2022-08-28 NOTE — Telephone Encounter (Signed)
Called pt husband answered not on dpr advised pt to check with pharmacy

## 2022-08-28 NOTE — Addendum Note (Signed)
Addended by: Tula Nakayama E on: 08/28/2022 12:19 PM   Modules accepted: Orders

## 2022-08-28 NOTE — Telephone Encounter (Signed)
Spouse came by office again in regard to previous tele.

## 2022-09-18 ENCOUNTER — Encounter: Payer: Self-pay | Admitting: Internal Medicine

## 2022-09-18 ENCOUNTER — Ambulatory Visit (INDEPENDENT_AMBULATORY_CARE_PROVIDER_SITE_OTHER): Payer: Medicare Other | Admitting: Internal Medicine

## 2022-09-18 VITALS — BP 110/72 | HR 60 | Ht 64.0 in | Wt 138.6 lb

## 2022-09-18 DIAGNOSIS — M79605 Pain in left leg: Secondary | ICD-10-CM | POA: Insufficient documentation

## 2022-09-18 NOTE — Assessment & Plan Note (Signed)
Presenting today for evaluation of a 1 day history of left leg pain.  She endorses a knot on her left shin.  On exam there is an area of bruising over the distal left shin with slight calcification of her knee.  She is concerned for DVT, however her exam today is not concerning for DVT.  Negative Homans.  There is no erythema, edema, or induration of the left lower extremity.  She additionally endorses pain in the lateral aspect of her left hip.  There is no tenderness palpation over the left greater trochanteric bursa.  ROM of the left hip is intact.  Negative FABER/FADIR and logroll.  No pain was elicited with resisted abduction of the left hip.  Unclear etiology.  I have recommended that she take Tylenol and apply Voltaren gel as needed for pain relief.  Gait is normal.  She was instructed to return to care if her pain worsens or does not improve with conservative treatment measures.  Otherwise she has follow-up with Dr. Moshe Cipro in February.

## 2022-09-18 NOTE — Patient Instructions (Signed)
It was a pleasure to see you today.  Thank you for giving [place] the opportunity to be involved in your care.  Below is a brief recap of your visit and next steps.  We will plan to see you again in February.  Summary I recommend taking tylenol as needed for pain relief and using Voltaren gel over the site of pain. This can be purchased at your pharmacy without a prescription. Return to care if your pain significantly worsens or spreads to other areas.

## 2022-09-18 NOTE — Progress Notes (Signed)
   Acute Office Visit  Subjective:     Patient ID: Kristine Garbe, female    DOB: 03/12/1951, 71 y.o.   MRN: [id number]  Chief Complaint  Patient presents with   Cyst    On left leg and noticed pain in the left hip/lower back area as well. Pain started 09/17/2022, couldn't hardly walk   Ms. Lyter presents for an acute visit today for a 1 day history of left lower extremity pain.  She reports a non-tender knot on her shin that she noticed yesterday.  She has a long distance trip planned for May and wants to ensure that this is not a DVT.  She also endorses pain along the lateral aspect of her left hip that radiates distally to the knee.  There was no inciting event or trauma at the onset of her pain.  She has not taken any medication for pain relief.  She endorses pain with ambulation.  Review of Systems  Musculoskeletal:  Positive for joint pain (Left hip and knee).  Skin:        knot on left shin  All other systems reviewed and are negative.     Objective:    BP 110/72   Pulse 60   Ht '5\' 4"'$  (1.626 m)   Wt 138 lb 9.6 oz (62.9 kg)   SpO2 97%   BMI 23.79 kg/m   Physical Exam Musculoskeletal:        General: Deformity (There is nontender ecchymoses on the distal left shin) present. No swelling or tenderness (No TTP over greater trochanter). Normal range of motion.     Left lower leg: No edema.     Comments: FABER/FADIR, negative logroll no pain elicited with resisted abduction of the left hip       Assessment & Plan:   Problem List Items Addressed This Visit       Left leg pain - Primary    Presenting today for evaluation of a 1 day history of left leg pain.  She endorses a knot on her left shin.  On exam there is an area of bruising over the distal left shin with slight calcification of her knee.  She is concerned for DVT, however her exam today is not concerning for DVT.  Negative Homans.  There is no erythema, edema, or induration of the left lower extremity.  She  additionally endorses pain in the lateral aspect of her left hip.  There is no tenderness palpation over the left greater trochanteric bursa.  ROM of the left hip is intact.  Negative FABER/FADIR and logroll.  No pain was elicited with resisted abduction of the left hip.  Unclear etiology.  I have recommended that she take Tylenol and apply Voltaren gel as needed for pain relief.  Gait is normal.  She was instructed to return to care if her pain worsens or does not improve with conservative treatment measures.  Otherwise she has follow-up with Dr. Moshe Cipro in February.       Return if symptoms worsen or fail to improve.  Johnette Abraham, MD

## 2022-09-19 ENCOUNTER — Other Ambulatory Visit: Payer: Self-pay | Admitting: Family Medicine

## 2022-09-25 ENCOUNTER — Other Ambulatory Visit: Payer: Self-pay | Admitting: Family Medicine

## 2022-10-11 ENCOUNTER — Other Ambulatory Visit: Payer: Self-pay | Admitting: Cardiology

## 2022-10-16 ENCOUNTER — Other Ambulatory Visit: Payer: Self-pay | Admitting: Diagnostic Neuroimaging

## 2022-11-13 ENCOUNTER — Encounter: Payer: Self-pay | Admitting: Family Medicine

## 2022-11-13 ENCOUNTER — Encounter: Payer: Medicare Other | Admitting: Family Medicine

## 2022-11-13 ENCOUNTER — Ambulatory Visit (INDEPENDENT_AMBULATORY_CARE_PROVIDER_SITE_OTHER): Payer: Medicare Other | Admitting: Family Medicine

## 2022-11-13 VITALS — BP 133/80 | HR 63 | Resp 16 | Ht 64.0 in | Wt 139.4 lb

## 2022-11-13 DIAGNOSIS — E559 Vitamin D deficiency, unspecified: Secondary | ICD-10-CM

## 2022-11-13 DIAGNOSIS — E7849 Other hyperlipidemia: Secondary | ICD-10-CM

## 2022-11-13 DIAGNOSIS — I10 Essential (primary) hypertension: Secondary | ICD-10-CM

## 2022-11-13 DIAGNOSIS — Z0001 Encounter for general adult medical examination with abnormal findings: Secondary | ICD-10-CM

## 2022-11-13 DIAGNOSIS — Z1231 Encounter for screening mammogram for malignant neoplasm of breast: Secondary | ICD-10-CM | POA: Diagnosis not present

## 2022-11-13 DIAGNOSIS — M7989 Other specified soft tissue disorders: Secondary | ICD-10-CM | POA: Diagnosis not present

## 2022-11-13 MED ORDER — UNABLE TO FIND: 0 refills | Status: AC

## 2022-11-13 MED ORDER — PAROXETINE HCL 10 MG PO TABS: 10.0000 mg | ORAL_TABLET | Freq: Every day | ORAL | 1 refills | Status: DC

## 2022-11-13 NOTE — Patient Instructions (Addendum)
F/U in 6 months, call if you need me sooner  Fasting CBC, lipid, cmp  and EGFr, TSH and vit D this week  You need the RSV vaccine, you can get this at your pharmacy  Please schedule mammogram at checkout [facility])  I will send a message to GI regarding your June holiday periods , 3 to 9 and 10 to 16, and request they work with you regarding colonoscopy. Please also contact them to schedule your colonoscopy  Nurse please send a script for knee high compression hose 15 to 19 mm Hg to [facility], and let her know, dx is leg swelling  Thanks for choosing [facility], we consider it a privelige to serve you.

## 2022-11-13 NOTE — Progress Notes (Unsigned)
    Tanya [name]     MRN: [id number]      DOB: 10/07/1950  HPI: Patient is in for annual physical exam. No other health concerns are expressed or addressed at the visit. Recent labs,  are reviewed. Immunization is reviewed , and  updated if needed.   PE: Pleasant  female, alert and oriented x 3, in no cardio-pulmonary distress. Afebrile. HEENT No facial trauma or asymetry. Sinuses non tender.  Extra occullar muscles intact.. External ears normal, . Neck: supple, no adenopathy,JVD or thyromegaly.No bruits.  Chest: Clear to ascultation bilaterally.No crackles or wheezes. Non tender to palpation  Breast: No asymetry,no masses or lumps. No tenderness. No nipple discharge or inversion. No axillary or supraclavicular adenopathy  Cardiovascular system; Heart sounds normal,  S1 and  S2 ,no S3.  No murmur, or thrill. Apical beat not displaced Peripheral pulses normal.  Abdomen: Soft, non tender, no organomegaly or masses. No bruits. Bowel sounds normal. No guarding, tenderness or rebound.   GU: External genitalia normal female genitalia , normal female distribution of hair. No lesions. Urethral meatus normal in size, no  Prolapse, no lesions visibly  Present. Bladder non tender. Vagina pink and moist , with no visible lesions , discharge present . Adequate pelvic support no  cystocele or rectocele noted Cervix pink and appears healthy, no lesions or ulcerations noted, no discharge noted from os Uterus normal size, no adnexal masses, no cervical motion or adnexal tenderness.   Musculoskeletal exam: Full ROM of spine, hips , shoulders and knees. No deformity ,swelling or crepitus noted. No muscle wasting or atrophy.   Neurologic: Cranial nerves 2 to 12 intact. Power, tone ,sensation and reflexes normal throughout. No disturbance in gait. No tremor.  Skin: Intact, no ulceration, erythema , scaling or rash noted. Pigmentation normal throughout  Psych; Normal mood  and affect. Judgement and concentration normal   Assessment & Plan:  No problem-specific Assessment & Plan notes found for this encounter.

## 2022-11-14 ENCOUNTER — Encounter: Payer: Self-pay | Admitting: Family Medicine

## 2022-11-14 DIAGNOSIS — R600 Localized edema: Secondary | ICD-10-CM | POA: Insufficient documentation

## 2022-11-14 DIAGNOSIS — Z0001 Encounter for general adult medical examination with abnormal findings: Secondary | ICD-10-CM | POA: Insufficient documentation

## 2022-11-14 DIAGNOSIS — E559 Vitamin D deficiency, unspecified: Secondary | ICD-10-CM | POA: Insufficient documentation

## 2022-11-14 NOTE — Assessment & Plan Note (Signed)
Mild, compression hose 15 to 19 mm  prescribed

## 2022-11-14 NOTE — Assessment & Plan Note (Signed)
Trace bilateral leg swelling aggravated by standing or sitting with feet hanging down, 15 to 189 mm compression hose prescribed

## 2022-11-14 NOTE — Assessment & Plan Note (Signed)
Hyperlipidemia:Low fat diet discussed and encouraged.   Lipid Panel  Lab Results  Component Value Date   CHOL 120 06/08/2022   HDL 61 06/08/2022   LDLCALC 42 06/08/2022   TRIG 87 06/08/2022   CHOLHDL 2.0 06/08/2022     Updated lab needed at/ before next visit.

## 2022-11-14 NOTE — Assessment & Plan Note (Signed)
Updated lab needed at/ before next visit.   

## 2022-11-14 NOTE — Assessment & Plan Note (Signed)

## 2022-11-16 DIAGNOSIS — E559 Vitamin D deficiency, unspecified: Secondary | ICD-10-CM | POA: Diagnosis not present

## 2022-11-16 DIAGNOSIS — I10 Essential (primary) hypertension: Secondary | ICD-10-CM | POA: Diagnosis not present

## 2022-11-16 DIAGNOSIS — E7849 Other hyperlipidemia: Secondary | ICD-10-CM | POA: Diagnosis not present

## 2022-11-17 LAB — LIPID PANEL
Chol/HDL Ratio: 1.8 ratio (ref 0.0–4.4)
Cholesterol, Total: 114 mg/dL (ref 100–199)
HDL: 64 mg/dL (ref >39)
LDL Chol Calc (NIH): 37 mg/dL (ref 0–99)
Triglycerides: 59 mg/dL (ref 0–149)
VLDL Cholesterol Cal: 13 mg/dL (ref 5–40)

## 2022-11-17 LAB — CBC WITH DIFFERENTIAL/PLATELET
Basophils Absolute: 0.1 10*3/uL (ref 0.0–0.2)
Basos: 2 %
EOS (ABSOLUTE): 0.1 10*3/uL (ref 0.0–0.4)
Eos: 3 %
Hematocrit: 36.4 % (ref 34.0–46.6)
Hemoglobin: 12.2 g/dL (ref 11.1–15.9)
Immature Grans (Abs): 0 10*3/uL (ref 0.0–0.1)
Immature Granulocytes: 0 %
Lymphocytes Absolute: 1.9 10*3/uL (ref 0.7–3.1)
Lymphs: 42 %
MCH: 32 pg (ref 26.6–33.0)
MCHC: 33.5 g/dL (ref 31.5–35.7)
MCV: 96 fL (ref 79–97)
Monocytes Absolute: 0.5 10*3/uL (ref 0.1–0.9)
Monocytes: 10 %
Neutrophils Absolute: 1.9 10*3/uL (ref 1.4–7.0)
Neutrophils: 43 %
Platelets: 294 10*3/uL (ref 150–450)
RBC: 3.81 x10E6/uL (ref 3.77–5.28)
RDW: 14 % (ref 11.7–15.4)
WBC: 4.5 10*3/uL (ref 3.4–10.8)

## 2022-11-17 LAB — CMP14+EGFR
ALT: 23 IU/L (ref 0–32)
AST: 24 IU/L (ref 0–40)
Albumin/Globulin Ratio: 1.8 (ref 1.2–2.2)
Albumin: 4.2 g/dL (ref 3.8–4.8)
Alkaline Phosphatase: 69 IU/L (ref 44–121)
BUN/Creatinine Ratio: 14 (ref 12–28)
BUN: 10 mg/dL (ref 8–27)
Bilirubin Total: 0.2 mg/dL (ref 0.0–1.2)
CO2: 20 mmol/L (ref 20–29)
Calcium: 9.4 mg/dL (ref 8.7–10.3)
Chloride: 106 mmol/L (ref 96–106)
Creatinine, Ser: 0.69 mg/dL (ref 0.57–1.00)
Globulin, Total: 2.3 g/dL (ref 1.5–4.5)
Glucose: 95 mg/dL (ref 70–99)
Potassium: 4.2 mmol/L (ref 3.5–5.2)
Sodium: 140 mmol/L (ref 134–144)
Total Protein: 6.5 g/dL (ref 6.0–8.5)
eGFR: 92 mL/min/{1.73_m2} (ref >59)

## 2022-11-17 LAB — TSH: TSH: 3 u[IU]/mL (ref 0.450–4.500)

## 2022-11-17 LAB — VITAMIN D 25 HYDROXY (VIT D DEFICIENCY, FRACTURES): Vit D, 25-Hydroxy: 34.3 ng/mL (ref 30.0–100.0)

## 2022-12-17 ENCOUNTER — Ambulatory Visit (HOSPITAL_COMMUNITY)
Admission: RE | Admit: 2022-12-17 | Discharge: 2022-12-17 | Disposition: A | Discharge status: Home / Self Care | Payer: Medicare Other | Source: Ambulatory Visit | Attending: Family Medicine | Admitting: Family Medicine

## 2022-12-17 ENCOUNTER — Encounter (HOSPITAL_COMMUNITY): Payer: Self-pay

## 2022-12-17 DIAGNOSIS — Z1231 Encounter for screening mammogram for malignant neoplasm of breast: Secondary | ICD-10-CM | POA: Insufficient documentation

## 2022-12-18 ENCOUNTER — Other Ambulatory Visit: Payer: Self-pay | Admitting: Diagnostic Neuroimaging

## 2022-12-19 ENCOUNTER — Other Ambulatory Visit: Payer: Self-pay

## 2022-12-19 ENCOUNTER — Telehealth: Payer: Self-pay | Admitting: Family Medicine

## 2022-12-19 MED ORDER — CLONIDINE HCL 0.3 MG PO TABS: 0.3000 mg | ORAL_TABLET | Freq: Every day | ORAL | 0 refills | Status: DC

## 2022-12-19 NOTE — Telephone Encounter (Signed)
Pt requesting small amount be refilled to [facility] in order has not come and patient is out of med.     cloNIDine (CATAPRES) 0.3 MG tablet

## 2022-12-19 NOTE — Telephone Encounter (Signed)
5 day supply sent to [facility]

## 2022-12-24 ENCOUNTER — Ambulatory Visit: Payer: Medicare Other | Attending: Cardiology | Admitting: Cardiology

## 2022-12-24 NOTE — Telephone Encounter (Signed)
Pt has been schedule for an appt in April.

## 2022-12-24 NOTE — Progress Notes (Deleted)
Clinical Summary Tanya [name] is a 72 y.o.female  seen today for follow up of the following medical problems.    1. CAD - s/p DESx2 to proximal LAD in 06/2018 with cath in 04/2019 showing 40% mid-LAD ISR with no significant stenosis along LM, LCx, or RCA. LVEDP 10     - no recent chest pains - compliant with meds   2. HTN -  Compliant with meds  - last visit we lowered norvasc to 5mg  daily, unclear if contributing to leg swelling     3. Hyperlipidemia - complant with statin   Jan 2023 TC 120 TG 49 HDL 62 LDL 50   4. DOE - followed by pulmonary - working on regular exercise to improve conditioning.    5. 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 s/p DESx2 to proximal LAD in 06/2018 b. cath in 04/2019 showing 40% mid-LAD ISR with no significant stenosis along LM, LCx, or RCA   Cystitis    Depression    Elevated liver enzymes    Fatty liver    GERD (gastroesophageal reflux disease)    Hard of hearing    Headache    migraine   Hyperlipidemia    Hypertension    Insomnia    Migraine    Osteoarthritis    Unspecified hypothyroidism    Vaginitis    Wears glasses    Wears hearing aid    B/L     No Known Allergies   Current Outpatient Medications  Medication Sig Dispense Refill   acetaminophen (TYLENOL) 500 MG tablet Take 1,000 mg by mouth every 6 (six) hours as needed for moderate pain.     amLODipine (NORVASC) 5 MG tablet Take 1 tablet (5 mg total) by mouth daily. 90 tablet 3   Ascorbic Acid (VITAMIN C) 1000 MG tablet Take 1,000 mg by mouth daily.     aspirin EC 81 MG tablet Take 81 mg by mouth  daily. Swallow whole.     cholecalciferol (VITAMIN D3) 25 MCG (1000 UNIT) tablet Take 1,000 Units by mouth daily.     cloNIDine (CATAPRES) 0.3 MG tablet Take 1 tablet (0.3 mg total) by mouth at bedtime. 5 tablet 0   clopidogrel (PLAVIX) 75 MG tablet TAKE 1 TABLET BY MOUTH DAILY 90 tablet 3   metoprolol succinate (TOPROL-XL) 25 MG 24 hr tablet Take 0.5 tablets (12.5 mg total) by mouth daily. 45 tablet 3   nitroGLYCERIN (NITROSTAT) 0.4 MG SL tablet Place 1 tablet (0.4 mg total) under the tongue every 5 (five) minutes as needed. 25 tablet 3   omeprazole (PRILOSEC) 40 MG capsule TAKE 1 CAPSULE BY MOUTH  DAILY 90 capsule 3   PARoxetine (PAXIL) 10 MG tablet Take 1 tablet (10 mg total) by mouth daily. 90 tablet 1   Potassium Chloride ER 20 MEQ TBCR TAKE 1 TABLET BY MOUTH DAILY 90 tablet 3   prednisoLONE acetate (PRED FORTE) 1 % ophthalmic suspension      rosuvastatin (CRESTOR) 40 MG tablet TAKE 1  TABLET BY MOUTH DAILY 90 tablet 3   temazepam (RESTORIL) 15 MG capsule Take 1 capsule (15 mg total) by mouth at bedtime as needed for sleep. 30 capsule 2   temazepam (RESTORIL) 15 MG capsule Take 1 capsule (15 mg total) by mouth at bedtime as needed for sleep. 30 capsule 3   topiramate (TOPAMAX) 50 MG tablet TAKE 1 TABLET BY MOUTH AT  BEDTIME 30 tablet 0   UNABLE TO FIND Compression hose 15-63mmhg knee high Dx leg swelling 1 each 0   No current facility-administered medications for this visit.     Past Surgical History:  Procedure Laterality Date   BIOPSY N/A 01/18/2021   Procedure: BIOPSY;  Surgeon: Harvel Quale, MD;  [facility];  Service: Gastroenterology;  Laterality: N/A;   BREAST BIOPSY Right    unsure when/benign   CATARACT EXTRACTION W/PHACO Right 05/08/2019   Procedure: CATARACT EXTRACTION PHACO AND INTRAOCULAR LENS PLACEMENT (IOC);  Surgeon: Baruch Goldmann, MD;  Location: [facility];  Service: Ophthalmology;  Laterality: Right;  CDE: 10.02   COLONOSCOPY N/A 02/08/2016    Procedure: COLONOSCOPY;  Surgeon: [name], MD;  [facility];  Service: Endoscopy;  Laterality: N/A;  1200 - moved to 11:45 - Ann to notify   CORONARY STENT INTERVENTION N/A 06/06/2018   Procedure: CORONARY STENT INTERVENTION;  Surgeon: [name], [name], MD;  Location: [place] [facility];  Service: Cardiovascular;  Laterality: N/A;   ESOPHAGOGASTRODUODENOSCOPY (EGD) WITH PROPOFOL N/A 01/18/2021   Procedure: ESOPHAGOGASTRODUODENOSCOPY (EGD) WITH PROPOFOL;  Surgeon: Harvel Quale, MD;  [facility];  Service: Gastroenterology;  Laterality: N/A;  10:30AM   INTRAVASCULAR PRESSURE WIRE/FFR STUDY N/A 06/06/2018   Procedure: INTRAVASCULAR PRESSURE WIRE/FFR STUDY;  Surgeon: [name], [name], MD;  Location: [place] [facility];  Service: Cardiovascular;  Laterality: N/A;   IR ANGIO INTRA EXTRACRAN SEL COM CAROTID INNOMINATE BILAT MOD SED  09/01/2019   IR ANGIO INTRA EXTRACRAN SEL INTERNAL CAROTID UNI L MOD SED  12/21/2019   IR ANGIO VERTEBRAL SEL VERTEBRAL BILAT MOD SED  09/01/2019   IR ANGIOGRAM FOLLOW UP STUDY  12/21/2019   IR TRANSCATH/EMBOLIZ  12/21/2019   LEFT HEART CATH AND CORONARY ANGIOGRAPHY N/A 04/24/2019   Procedure: LEFT HEART CATH AND CORONARY ANGIOGRAPHY;  Surgeon: Belva Crome, MD;  Location: [place] [facility];  Service: Cardiovascular;  Laterality: N/A;   LEFT HEART CATHETERIZATION WITH CORONARY ANGIOGRAM N/A 11/19/2014   Procedure: LEFT HEART CATHETERIZATION WITH CORONARY ANGIOGRAM;  Surgeon: [name], MD;  [facility];  Service: Cardiovascular;  Laterality: N/A;   RADIOLOGY WITH ANESTHESIA N/A 11/23/2019   Procedure: Drucilla Schmidt;  Surgeon: Luanne Bras, MD;  Location: [place];  Service: Radiology;  Laterality: N/A;   RADIOLOGY WITH ANESTHESIA N/A 12/21/2019   Procedure: EMBOLIZATION;  Surgeon: Luanne Bras, MD;  Location: [place];  Service: Radiology;  Laterality: N/A;   RIGHT/LEFT HEART CATH AND CORONARY ANGIOGRAPHY N/A  06/06/2018   Procedure: RIGHT/LEFT HEART CATH AND CORONARY ANGIOGRAPHY;  Surgeon: [name], [name], MD;  Location: [place] [facility];  Service: Cardiovascular;  Laterality: N/A;     No Known Allergies    Family History  Problem Relation Age of Onset   Dementia Mother    Heart disease Mother        heart attack    Hypertension Mother    Heart attack Father    Heart disease Father        heart attack  Hypertension Father      Social History Ms. Bodner reports that she has never smoked. She has never been exposed to tobacco smoke. She has never used smokeless tobacco. Ms. Rajchel reports no history of alcohol use.   Review of Systems CONSTITUTIONAL: No weight loss, fever, chills, weakness or fatigue.  HEENT: Eyes: No visual loss, blurred vision, double vision or yellow sclerae.No hearing loss, sneezing, congestion, runny nose or sore throat.  SKIN: No rash or itching.  CARDIOVASCULAR:  RESPIRATORY: No shortness of breath, cough or sputum.  GASTROINTESTINAL: No anorexia, nausea, vomiting or diarrhea. No abdominal pain or blood.  GENITOURINARY: No burning on urination, no polyuria NEUROLOGICAL: No headache, dizziness, syncope, paralysis, ataxia, numbness or tingling in the extremities. No change in bowel or bladder control.  MUSCULOSKELETAL: No muscle, back pain, joint pain or stiffness.  LYMPHATICS: No enlarged nodes. No history of splenectomy.  PSYCHIATRIC: No history of depression or anxiety.  ENDOCRINOLOGIC: No reports of sweating, cold or heat intolerance. No polyuria or polydipsia.  Marland Kitchen   Physical Examination There were no vitals filed for this visit. There were no vitals filed for this visit.  Gen: resting comfortably, no acute distress HEENT: no scleral icterus, pupils equal round and reactive, no palptable cervical adenopathy,  CV Resp: Clear to auscultation bilaterally GI: abdomen is soft, non-tender, non-distended, normal bowel sounds, no  hepatosplenomegaly MSK: extremities are warm, no edema.  Skin: warm, no rash Neuro:  no focal deficits Psych: appropriate affect   Diagnostic Studies  Cardiac Catheterization: 04/2019 40% eccentric mid LAD in-stent restenosis.  At completion of stent procedure in September 2020, there was residual stent deformity in this region.  There is no high-grade obstruction in the LAD stent.  The LAD is otherwise normal. Normal left main Normal circumflex Normal RCA Normal LV systolic function and end-diastolic pressure   RECOMMENDATIONS:   Dyspnea cannot be explained by findings on today's study based on absence of significant coronary obstruction and normal LV hemodynamics. Continue dual antiplatelet therapy beyond 12 months due to overlapping stents in the LAD.   Echocardiogram: 06/2019 IMPRESSIONS     1. Left ventricular ejection fraction, by visual estimation, is 55 to  60%. The left ventricle has normal function. There is no left ventricular  hypertrophy.   2. Left ventricular diastolic Doppler parameters are consistent with  impaired relaxation pattern of LV diastolic filling.   3. Global right ventricle has normal systolic function.The right  ventricular size is normal. No increase in right ventricular wall  thickness.   4. Left atrial size was normal.   5. Right atrial size was normal.   6. The mitral valve is normal in structure. Trace mitral valve  regurgitation. No evidence of mitral stenosis.   7. The tricuspid valve is normal in structure. Tricuspid valve  regurgitation was not visualized by color flow Doppler.   8. The aortic valve is tricuspid Aortic valve regurgitation was not  visualized by color flow Doppler. Mild aortic valve sclerosis without  stenosis.   9. The pulmonic valve was not well visualized. Pulmonic valve  regurgitation is not visualized by color flow Doppler.  10. Normal pulmonary artery systolic pressure.  11. The inferior vena cava IVC is small,  suggesting low RA pressure and  hypovolemia.    Long-term Monitor: 01/2020 Sinus rhythm with rare PACs and PVCs and short bursts of SVT (possibly atrial tachycardia).       Assessment and Plan  1. CAD - no significant symptoms, continue current meds  2. HTN - at goal. With chronic leg swelling without clear cause try lowering norvasc to 5mg  daily, could try stopping at f/u if ongoing.      3. Hyperlipidemia - at goal, continue current meds   5. Left leg swelling - seen by vascular - no clear etiology, try weaning and potentially stopping norvasc      Arnoldo Lenis, M.D., F.A.C.C.

## 2022-12-26 ENCOUNTER — Other Ambulatory Visit: Payer: Self-pay | Admitting: Internal Medicine

## 2022-12-26 ENCOUNTER — Telehealth: Payer: Self-pay | Admitting: Family Medicine

## 2022-12-26 DIAGNOSIS — F419 Anxiety disorder, unspecified: Secondary | ICD-10-CM

## 2022-12-26 MED ORDER — TEMAZEPAM 15 MG PO CAPS: 15.0000 mg | ORAL_CAPSULE | Freq: Every evening | ORAL | 0 refills | Status: DC | PRN

## 2022-12-26 NOTE — Telephone Encounter (Signed)
Prescription Request  12/26/2022  LOV: 11/13/2022  What is the name of the medication or equipment? temazepam (RESTORIL) 15 MG capsule   Have you contacted your pharmacy to request a refill? No   Which pharmacy would you like this sent to?  [place], [place] - [id number] [place] [address] Phone: [phone] Fax: [phone]  [facility] ([place], [place] [facility] [address] [place] [address] [place] [address] Phone: [phone] Fax: [phone], [facility] (New Address) - [place], [place] AT Previously: [place] [place], [place] [address] [place] [address] Phone: [phone] Fax: [phone]  [place], [place] [address] [place] [address] Phone: [phone] Fax: [phone]  [facility] [phone] - [place], [place] - [place] [address] [place] [id number] [place] [address] Phone: [phone] Fax: [phone]    Patient notified that their request is being sent to the clinical staff for review and that they should receive a response within 2 business days.   Please advise at [facility] [phone]

## 2022-12-27 ENCOUNTER — Other Ambulatory Visit (HOSPITAL_COMMUNITY): Payer: Self-pay | Admitting: Interventional Radiology

## 2022-12-27 ENCOUNTER — Telehealth (HOSPITAL_COMMUNITY): Payer: Self-pay

## 2022-12-27 DIAGNOSIS — I671 Cerebral aneurysm, nonruptured: Secondary | ICD-10-CM

## 2022-12-27 NOTE — Telephone Encounter (Signed)
Called to schedule mra, no answer, vm full. AB

## 2023-01-06 ENCOUNTER — Other Ambulatory Visit: Payer: Self-pay | Admitting: Family Medicine

## 2023-01-06 DIAGNOSIS — R12 Heartburn: Secondary | ICD-10-CM

## 2023-01-08 ENCOUNTER — Ambulatory Visit: Payer: PRIVATE HEALTH INSURANCE | Admitting: Adult Health

## 2023-01-09 ENCOUNTER — Other Ambulatory Visit: Payer: Self-pay | Admitting: Diagnostic Neuroimaging

## 2023-01-10 ENCOUNTER — Telehealth: Payer: Self-pay | Admitting: Family Medicine

## 2023-01-10 NOTE — Telephone Encounter (Signed)
[facility] Called in on patient behalf.  Needs health history.  Med list already faxed over   Fax # [phone] Phone # [phone].

## 2023-01-11 NOTE — Telephone Encounter (Signed)
Faxed notes to office

## 2023-01-14 ENCOUNTER — Telehealth: Payer: Self-pay | Admitting: Family Medicine

## 2023-01-14 NOTE — Telephone Encounter (Signed)
The office will need to fax over a request with what she is having done and what she will need to stop and how long before procedure and Dr will respond and fax back

## 2023-01-14 NOTE — Telephone Encounter (Signed)
Pt needs tooth removed, wants to know if she can stop bleed thinner. [facility], Dr Jasmine Awe

## 2023-01-15 ENCOUNTER — Telehealth: Payer: Self-pay | Admitting: *Deleted

## 2023-01-15 NOTE — Telephone Encounter (Signed)
I s/w Shantelle and have confirmed procedure to be done.

## 2023-01-15 NOTE — Telephone Encounter (Signed)
Left message for the requesting they will need to send over a new fax with the procedure to be done at this time. The request we were faxed has multiple Tx's listed as being done. We will not be able to provide a blanket type clearance. We cannot proceed until we have a new clearance. Left message for any extractions we will need to know how many teeth being extracted and if simple or surgical extractions.

## 2023-01-15 NOTE — Telephone Encounter (Signed)
Called the dentist will fax over forms

## 2023-01-15 NOTE — Telephone Encounter (Signed)
Caller returned Pre-op's call.

## 2023-01-15 NOTE — Telephone Encounter (Signed)
   Pre-operative Risk Assessment    Patient Name: Tanya [name]  DOB: 07-11-1951 MRN: [id number]     Request for Surgical Clearance    Procedure:  Dental Extraction - Amount of Teeth to be Pulled:  1 TOOTH FOR SIMPLE EXTRACTION  Date of Surgery:  Clearance 01/29/23                                 Surgeon:  DR. Jasmine Awe, DDS Surgeon's Group or Practice Name:  [facility] Phone number:  [phone] Fax number:  [phone]   Type of Clearance Requested:   - Medical  - Pharmacy:  Hold Clopidogrel (Plavix)     Type of Anesthesia:  Local    Additional requests/questions:    [name]   01/15/2023, 4:40 PM

## 2023-01-16 NOTE — Telephone Encounter (Signed)
    Primary Cardiologist: Dina Rich, MD  Chart reviewed as part of pre-operative protocol coverage. Simple dental extractions are considered low risk procedures per guidelines and generally do not require any specific cardiac clearance. It is also generally accepted that for simple extractions and dental cleanings, there is no need to interrupt blood thinner therapy.   SBE prophylaxis is not required for the patient.  I will route this recommendation to the requesting party via Epic fax function and remove from pre-op pool.  Please call with questions.  Sharlene Dory, PA-C 01/16/2023, 7:43 AM

## 2023-01-22 NOTE — Telephone Encounter (Signed)
Same clearance applies if procedure is surgical and only one or two teeth. No need to hold antiplatelet or anticoagulant medications.  Extractions of 3 or more teeth clearance to hold anticoagulant and/or antiplatelet medications.   Levi Aland, NP-C  01/22/2023, 3:16 PM [address] Office [phone] Fax [phone]

## 2023-01-22 NOTE — Telephone Encounter (Signed)
Shantell from [facility], has been made aware of the clearance below.  Another copy has been faxed.

## 2023-01-22 NOTE — Telephone Encounter (Signed)
Received another clearance from the Dentist office.  I called to confirm what they were asking.  Dentist has the clearance for a simple extraction, but wants to know if it turns surgical, does the same clearance apply?

## 2023-01-25 ENCOUNTER — Encounter (INDEPENDENT_AMBULATORY_CARE_PROVIDER_SITE_OTHER): Payer: Self-pay | Admitting: *Deleted

## 2023-01-28 ENCOUNTER — Encounter: Payer: Self-pay | Admitting: Nurse Practitioner

## 2023-01-28 ENCOUNTER — Ambulatory Visit: Payer: Medicare Other | Attending: Nurse Practitioner | Admitting: Nurse Practitioner

## 2023-01-28 ENCOUNTER — Telehealth: Payer: Self-pay | Admitting: Family Medicine

## 2023-01-28 VITALS — BP 122/70 | HR 62 | Ht 64.0 in | Wt 138.8 lb

## 2023-01-28 DIAGNOSIS — E785 Hyperlipidemia, unspecified: Secondary | ICD-10-CM

## 2023-01-28 DIAGNOSIS — I779 Disorder of arteries and arterioles, unspecified: Secondary | ICD-10-CM | POA: Diagnosis not present

## 2023-01-28 DIAGNOSIS — R600 Localized edema: Secondary | ICD-10-CM

## 2023-01-28 DIAGNOSIS — M542 Cervicalgia: Secondary | ICD-10-CM

## 2023-01-28 DIAGNOSIS — I25118 Atherosclerotic heart disease of native coronary artery with other forms of angina pectoris: Secondary | ICD-10-CM | POA: Diagnosis not present

## 2023-01-28 DIAGNOSIS — I10 Essential (primary) hypertension: Secondary | ICD-10-CM | POA: Diagnosis not present

## 2023-01-28 DIAGNOSIS — R42 Dizziness and giddiness: Secondary | ICD-10-CM | POA: Diagnosis not present

## 2023-01-28 MED ORDER — TEMAZEPAM 15 MG PO CAPS: 15.0000 mg | ORAL_CAPSULE | Freq: Every day | ORAL | 5 refills | Status: DC

## 2023-01-28 NOTE — Telephone Encounter (Signed)
Came by office need med refill temazepam (RESTORIL) 15 MG capsule [[id number]]  Pharmacy  [facility] [address], [place] - [address], [place] [address] Phone: [phone]  Fax: [phone]

## 2023-01-28 NOTE — Patient Instructions (Signed)
Medication Instructions:  Your physician recommends that you continue on your current medications as directed. Please refer to the Current Medication list given to you today.   Labwork: none  Testing/Procedures: Your physician has requested that you have a carotid duplex. This test is an ultrasound of the carotid arteries in your neck. It looks at blood flow through these arteries that supply the brain with blood. Allow one hour for this exam. There are no restrictions or special instructions.   Follow-Up:  Your physician recommends that you schedule a follow-up appointment in: 4-6 weeks  Any Other Special Instructions Will Be Listed Below (If Applicable).  If you need a refill on your cardiac medications before your next appointment, please call your pharmacy.

## 2023-01-28 NOTE — Progress Notes (Addendum)
Office Visit    Patient Name: Tanya [name] Date of Encounter: 01/28/2023  PCP:  Kerri Perches, MD   [facility]  Cardiologist:  Dina Rich, MD  Advanced Practice Provider:  No care team member to display Electrophysiologist:  None   Chief Complaint    Tanya [name] is a 72 y.o. female with a hx of CAD, hypertension, hyperlipidemia, DOE, leg edema, who presents today for chest pain and neck pain evaluation.  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 s/p DESx2 to proximal LAD in 06/2018 b. cath in 04/2019 showing 40% mid-LAD ISR with no significant stenosis along LM, LCx, or RCA   Cystitis    Depression    Elevated liver enzymes    Fatty liver    GERD (gastroesophageal reflux disease)    Hard of hearing    Headache    migraine   Hyperlipidemia    Hypertension    Insomnia    Migraine    Osteoarthritis    Unspecified hypothyroidism    Vaginitis    Wears glasses    Wears hearing aid    B/L   Past Surgical History:  Procedure Laterality Date   BIOPSY N/A 01/18/2021   Procedure: BIOPSY;  Surgeon: Dolores Frame, MD;  Location: [facility];  Service: Gastroenterology;  Laterality: N/A;   BREAST BIOPSY Right    unsure when/benign   CATARACT EXTRACTION W/PHACO Right 05/08/2019   Procedure: CATARACT EXTRACTION PHACO AND INTRAOCULAR LENS PLACEMENT (IOC);  Surgeon: Fabio Pierce, MD;  Location: [facility];  Service: Ophthalmology;  Laterality: Right;  CDE: 10.02   COLONOSCOPY N/A 02/08/2016   Procedure: COLONOSCOPY;  Surgeon: Malissa Hippo, MD;  Location: [facility];  Service: Endoscopy;  Laterality: N/A;  1200 - moved to 11:45 - Ann to notify   CORONARY PRESSURE/FFR STUDY N/A 06/06/2018   Procedure: INTRAVASCULAR PRESSURE WIRE/FFR STUDY;  Surgeon: Swaziland, [name], MD;   Location: [facility] INVASIVE CV LAB;  Service: Cardiovascular;  Laterality: N/A;   CORONARY STENT INTERVENTION N/A 06/06/2018   Procedure: CORONARY STENT INTERVENTION;  Surgeon: Swaziland, [name], MD;  Location: [facility] INVASIVE CV LAB;  Service: Cardiovascular;  Laterality: N/A;   ESOPHAGOGASTRODUODENOSCOPY (EGD) WITH PROPOFOL N/A 01/18/2021   Procedure: ESOPHAGOGASTRODUODENOSCOPY (EGD) WITH PROPOFOL;  Surgeon: Dolores Frame, MD;  Location: [facility];  Service: Gastroenterology;  Laterality: N/A;  10:30AM   IR ANGIO INTRA EXTRACRAN SEL COM CAROTID INNOMINATE BILAT MOD SED  09/01/2019   IR ANGIO INTRA EXTRACRAN SEL INTERNAL CAROTID UNI L MOD SED  12/21/2019   IR ANGIO VERTEBRAL SEL VERTEBRAL BILAT MOD SED  09/01/2019   IR ANGIOGRAM FOLLOW UP STUDY  12/21/2019   IR TRANSCATH/EMBOLIZ  12/21/2019   LEFT HEART CATH AND CORONARY ANGIOGRAPHY N/A 04/24/2019   Procedure: LEFT HEART CATH AND CORONARY ANGIOGRAPHY;  Surgeon: Lyn Records, MD;  Location: [facility] INVASIVE CV LAB;  Service: Cardiovascular;  Laterality: N/A;   LEFT HEART CATHETERIZATION WITH CORONARY ANGIOGRAM N/A 11/19/2014   Procedure: LEFT HEART CATHETERIZATION WITH CORONARY ANGIOGRAM;  Surgeon: Marykay Lex, MD;  Location: [facility] CATH LAB;  Service: Cardiovascular;  Laterality: N/A;   RADIOLOGY WITH ANESTHESIA N/A 11/23/2019   Procedure: Otho Bellows;  Surgeon: Julieanne Cotton, MD;  Location: [facility]  OR;  Service: Radiology;  Laterality: N/A;   RADIOLOGY WITH ANESTHESIA N/A 12/21/2019   Procedure: EMBOLIZATION;  Surgeon: Julieanne Cotton, MD;  Location: [facility] OR;  Service: Radiology;  Laterality: N/A;   RIGHT/LEFT HEART CATH AND CORONARY ANGIOGRAPHY N/A 06/06/2018   Procedure: RIGHT/LEFT HEART CATH AND CORONARY ANGIOGRAPHY;  Surgeon: Swaziland, [name], MD;  Location: [facility] INVASIVE CV LAB;  Service: Cardiovascular;  Laterality: N/A;    Allergies  No Known Allergies  History of Present Illness    Tanya [name] is a 72 y.o. female with a PMH  as mentioned above.  Previous cardiovascular history includes 2 drug-eluting stents placed to proximal LAD in 2019.  Cardiac catheterization in 2020 revealed 40% mid LAD ISR, no significant stenosis along left circumflex, RCA, left main.  Last seen by Dr. Dina Rich on June 08, 2022.  Was overall doing well at that time.  It was suggested to try lowering Norvasc to 5 mg daily to help improve left leg swelling, recommended that this medication could possibly be stopped in the future.  She presents today for chest pain evaluation.  She reports neck pain, occurs daily, has been ongoing for over several months, and causes her great concern.  Rarely feels dizzy, noticed with bending over, not bothersome per her report.  Recently, has noticed rare episodes of shooting chest pain on mid to right side of her chest, lasts 1 second or less, denies any aggravating or alleviating factors, no exertional symptoms, difficult to describe. Denies any shortness of breath, palpitations, syncope, presyncope, dizziness, orthopnea, PND, significant weight changes, acute bleeding, or claudication.  Left leg swelling is stable.   EKGs/Labs/Other Studies Reviewed:   The following studies were reviewed today:   EKG:  EKG is ordered today.  The ekg ordered today demonstrates SB, 55 bpm, nonspecific, ST/T wave abnormality, no acute ischemic changes.   Echocardiogram 08/2021: 1. Left ventricular ejection fraction, by estimation, is 60 to 65%. The  left ventricle has normal function. The left ventricle has no regional  wall motion abnormalities. Left ventricular diastolic parameters are  indeterminate.   2. Right ventricular systolic function is normal. The right ventricular  size is normal. Tricuspid regurgitation signal is inadequate for assessing  PA pressure.   3. The mitral valve is grossly normal. Mild mitral valve regurgitation.   4. The aortic valve is tricuspid. Aortic valve regurgitation is not   visualized.   Comparison(s): Prior images reviewed side by side. LVEF remains normal  range.  Carotid Doppler 12/2020: IMPRESSION: Moderate amount of bilateral atherosclerotic plaque, progressed compared to the 2013 examination though not resulting in a hemodynamically significant stenosis within either internal carotid artery.  Cardiac monitor 2021: Sinus rhythm with rare PACs and PVCs and short bursts of SVT (possibly atrial tachycardia).  Left heart cath 04/2019: 40% eccentric mid LAD in-stent restenosis.  At completion of stent procedure in September 2020, there was residual stent deformity in this region.  There is no high-grade obstruction in the LAD stent.  The LAD is otherwise normal. Normal left main Normal circumflex Normal RCA Normal LV systolic function and end-diastolic pressure   RECOMMENDATIONS:   Dyspnea cannot be explained by findings on today's study based on absence of significant coronary obstruction and normal LV hemodynamics. Continue dual antiplatelet therapy beyond 12 months due to overlapping stents in the LAD.  Right and left heart cath 06/2018: Prox LAD lesion is 80% stenosed. Post intervention, there is a 0% residual stenosis. A drug-eluting stent was successfully placed  using a STENT SYNERGY DES 3.5X20. A drug-eluting stent was successfully placed using a STENT SYNERGY DES 3X12. The left ventricular systolic function is normal. LV end diastolic pressure is normal. The left ventricular ejection fraction is 55-65% by visual estimate.   1. Single vessel obstructive CAD involving the mid LAD immediately after a large diagonal. Abnormal FFR of 0.68. 2. Normal LV function 3. Low LV filling pressures 4. Normal right heart pressures 5. Normal cardiac output 6. Successful PCI of the proximal to mid LAD with DES x 2.    Plan: patient is a candidate for same day DC. Will switch Prilosec to Protonix. Will changed to high dose statin therapy.   Recommend  uninterrupted dual antiplatelet therapy with Aspirin 81mg  daily and Clopidogrel 75mg  daily for a minimum of 6 months (stable ischemic heart disease - Class I recommendation).  Myoview 03/2018: Blood pressure demonstrated a hypertensive response to exercise. There was no ST segment deviation noted during stress. The study is normal. There are no perfusion defects. This is a low risk study. Duke treadmill score of 11 consistent with low risk for major cardiac events, excellent exercise function capacity (11 minutes, 11 METs) The left ventricular ejection fraction is hyperdynamic (>65%).  Recent Labs: 04/06/2022: BNP 49.1 11/16/2022: ALT 23; BUN 10; Creatinine, Ser 0.69; Hemoglobin 12.2; Platelets 294; Potassium 4.2; Sodium 140; TSH 3.000  Recent Lipid Panel    Component Value Date/Time   CHOL 114 11/16/2022 1031   TRIG 59 11/16/2022 1031   HDL 64 11/16/2022 1031   CHOLHDL 1.8 11/16/2022 1031   CHOLHDL 2.0 01/27/2020 1022   VLDL 11 06/29/2019 0946   LDLCALC 37 11/16/2022 1031   LDLCALC 44 01/27/2020 1022     Home Medications   Current Meds  Medication Sig   acetaminophen (TYLENOL) 500 MG tablet Take 1,000 mg by mouth every 6 (six) hours as needed for moderate pain.   amLODipine (NORVASC) 5 MG tablet Take 1 tablet (5 mg total) by mouth daily.   Ascorbic Acid (VITAMIN C) 1000 MG tablet Take 1,000 mg by mouth daily.   aspirin EC 81 MG tablet Take 81 mg by mouth daily. Swallow whole.   cholecalciferol (VITAMIN D3) 25 MCG (1000 UNIT) tablet Take 1,000 Units by mouth daily.   cloNIDine (CATAPRES) 0.3 MG tablet Take 1 tablet (0.3 mg total) by mouth at bedtime.   clopidogrel (PLAVIX) 75 MG tablet TAKE 1 TABLET BY MOUTH DAILY   metoprolol succinate (TOPROL-XL) 25 MG 24 hr tablet Take 0.5 tablets (12.5 mg total) by mouth daily.   nitroGLYCERIN (NITROSTAT) 0.4 MG SL tablet Place 1 tablet (0.4 mg total) under the tongue every 5 (five) minutes as needed.   omeprazole (PRILOSEC) 40 MG capsule TAKE  1 CAPSULE BY MOUTH DAILY   PARoxetine (PAXIL) 10 MG tablet Take 1 tablet (10 mg total) by mouth daily.   Potassium Chloride ER 20 MEQ TBCR TAKE 1 TABLET BY MOUTH DAILY   rosuvastatin (CRESTOR) 40 MG tablet TAKE 1 TABLET BY MOUTH DAILY   temazepam (RESTORIL) 15 MG capsule Take 1 capsule (15 mg total) by mouth at bedtime as needed for sleep.   topiramate (TOPAMAX) 50 MG tablet TAKE 1 TABLET BY MOUTH AT  BEDTIME   UNABLE TO FIND Compression hose 15-34mmhg knee high Dx leg swelling     Review of Systems    All other systems reviewed and are otherwise negative except as noted above.  Physical Exam    VS:  BP 122/70 (BP Location:  Left Arm, Patient Position: Sitting, Cuff Size: Normal)   Pulse 62   Ht 5\' 4"  (1.626 m)   Wt 138 lb 12.8 oz (63 kg)   SpO2 97%   BMI 23.82 kg/m  , BMI Body mass index is 23.82 kg/m.  Wt Readings from Last 3 Encounters:  01/28/23 138 lb 12.8 oz (63 kg)  11/13/22 139 lb 6.4 oz (63.2 kg)  09/18/22 138 lb 9.6 oz (62.9 kg)     GEN: Well nourished, well developed, in no acute distress. HEENT: normal. Neck: Supple, no JVD, carotid bruits, or masses. Cardiac: S1/S2, RRR, no murmurs, rubs, or gallops. No clubbing, cyanosis.  Stable, nonpitting edema along BLE.  Radials/PT 2+ and equal bilaterally.  Respiratory:  Respirations regular and unlabored, clear to auscultation bilaterally. MS: No deformity or atrophy. Skin: Warm and dry, no rash. Neuro:  Strength and sensation are intact. Psych: Normal affect.  Assessment & Plan    Neck pain, carotid artery disease, HLD Neck pain is patient's chief concern today, happens nearly every day and causes her great concern.  Carotid Doppler in 2022 revealed moderate amount of bilateral plaque, progressed since 2013 examination, not hemodynamically significant stenosis in either carotid artery.  Will update carotid artery duplex at this time.  LDL 37 from 11/2022.  Continue current medication regimen. ED precautions discussed.    CAD Recent atypical chest pain, brief/transient and rare in occurrence.  Will continue to monitor at this time.  EKG is reassuring today.  If no relief/improvement in symptoms by next follow-up, plan to arrange Myoview.  Continue current medication regimen.  ED precautions discussed.  HTN Blood pressure stable. Discussed to monitor BP at home at least 2 hours after medications and sitting for 5-10 minutes.  No medication changes at this time. Heart healthy diet and regular cardiovascular exercise encouraged.   Leg edema Reports stable left lower extremity swelling, appears this is due to chronic venous insufficiency.  Appears equal on both sides.  Recommended/discussed compression stockings.  No medication changes at this time. Heart healthy diet and regular cardiovascular exercise encouraged.   Orthostatic dizziness Rare in occurrence, not bothersome per her report.  Arranging carotid Doppler as mentioned above.  Discussed conservative measures, and she verbalized understanding. Reviewed labs from February 2024, WNL.  If no improvement by next follow-up, plan to arrange orthostatics.     Disposition: Follow up in 4-6 week(s) with Dina Rich, MD or APP.  Signed, Sharlene Dory, NP 01/28/2023, 10:41 AM  [facility]

## 2023-02-01 ENCOUNTER — Ambulatory Visit (HOSPITAL_COMMUNITY)
Admission: RE | Admit: 2023-02-01 | Discharge: 2023-02-01 | Disposition: A | Discharge status: Home / Self Care | Payer: Medicare Other | Source: Ambulatory Visit | Attending: Nurse Practitioner | Admitting: Nurse Practitioner

## 2023-02-01 DIAGNOSIS — I6523 Occlusion and stenosis of bilateral carotid arteries: Secondary | ICD-10-CM | POA: Diagnosis not present

## 2023-02-01 DIAGNOSIS — I25118 Atherosclerotic heart disease of native coronary artery with other forms of angina pectoris: Secondary | ICD-10-CM | POA: Diagnosis not present

## 2023-02-05 ENCOUNTER — Telehealth: Payer: Self-pay | Admitting: Family Medicine

## 2023-02-05 ENCOUNTER — Other Ambulatory Visit: Payer: Self-pay

## 2023-02-05 MED ORDER — METOPROLOL SUCCINATE ER 25 MG PO TB24: 12.5000 mg | ORAL_TABLET | Freq: Every day | ORAL | 3 refills | Status: DC

## 2023-02-05 NOTE — Telephone Encounter (Signed)
Prescription Request  02/05/2023  LOV: 11/13/2022  What is the name of the medication or equipment? metoprolol succinate (TOPROL-XL) 25 MG 24 hr tablet     Have you contacted your pharmacy to request a refill? No   Which pharmacy would you like this sent to?  [facility] [address], [place] - [address] [place] [address] Phone: [phone] Fax: [phone]     Patient notified that their request is being sent to the clinical staff for review and that they should receive a response within 2 business days.   Please advise at [facility] [phone]    Patient will be leaving to go out of the country Saturday, will run out of medicine before she comes back wants to know if pcp can up the quantity so that patient will have enough to last until she gets back.

## 2023-02-05 NOTE — Telephone Encounter (Signed)
Refills sent

## 2023-02-07 ENCOUNTER — Telehealth (INDEPENDENT_AMBULATORY_CARE_PROVIDER_SITE_OTHER): Payer: Self-pay | Admitting: Gastroenterology

## 2023-02-07 NOTE — Telephone Encounter (Signed)
Who is your primary care physician: Dr.Margaret Lodema Hong  Reasons for the colonoscopy: 7 year recall; 2 year recall for EGD  Have you had a colonoscopy before?  yes  Do you have family history of colon cancer? no  Previous colonoscopy with polyps removed? no  Do you have a history colorectal cancer?   no  Are you diabetic? If yes, Type 1 or Type 2?    no  Do you have a prosthetic or mechanical heart valve? no  Do you have a pacemaker/defibrillator?   no  Have you had endocarditis/atrial fibrillation? no  Have you had joint replacement within the last 12 months?  no  Do you tend to be constipated or have to use laxatives? no  Do you have any history of drugs or alchohol?  no  Do you use supplemental oxygen?  no  Have you had a stroke or heart attack within the last 6 months? no  Do you take weight loss medication?  no  For female patients: have you had a hysterectomy?  no                                     are you post menopausal?       no                                            do you still have your menstrual cycle? no      Do you take any blood-thinning medications such as: (aspirin, warfarin, Plavix, Aggrenox)  Bayer Aspirin 81 mg one a day  If yes we need the name, milligram, dosage and who is prescribing doctor  Current Outpatient Medications on File Prior to Visit  Medication Sig Dispense Refill   acetaminophen (TYLENOL) 500 MG tablet Take 1,000 mg by mouth every 6 (six) hours as needed for moderate pain.     amLODipine (NORVASC) 5 MG tablet Take 1 tablet (5 mg total) by mouth daily. 90 tablet 3   Ascorbic Acid (VITAMIN C) 1000 MG tablet Take 1,000 mg by mouth daily.     aspirin EC 81 MG tablet Take 81 mg by mouth daily. Swallow whole.     cholecalciferol (VITAMIN D3) 25 MCG (1000 UNIT) tablet Take 1,000 Units by mouth daily.     cloNIDine (CATAPRES) 0.3 MG tablet Take 1 tablet (0.3 mg total) by mouth at bedtime. 5 tablet 0   clopidogrel (PLAVIX) 75 MG tablet  TAKE 1 TABLET BY MOUTH DAILY 90 tablet 3   metoprolol succinate (TOPROL-XL) 25 MG 24 hr tablet Take 0.5 tablets (12.5 mg total) by mouth daily. 45 tablet 3   nitroGLYCERIN (NITROSTAT) 0.4 MG SL tablet Place 1 tablet (0.4 mg total) under the tongue every 5 (five) minutes as needed. 25 tablet 3   omeprazole (PRILOSEC) 40 MG capsule TAKE 1 CAPSULE BY MOUTH DAILY 100 capsule 2   PARoxetine (PAXIL) 10 MG tablet Take 1 tablet (10 mg total) by mouth daily. 90 tablet 1   Potassium Chloride ER 20 MEQ TBCR TAKE 1 TABLET BY MOUTH DAILY 90 tablet 3   prednisoLONE acetate (PRED FORTE) 1 % ophthalmic suspension      rosuvastatin (CRESTOR) 40 MG tablet TAKE 1 TABLET BY MOUTH DAILY 90 tablet 3   temazepam (RESTORIL) 15 MG capsule Take  1 capsule (15 mg total) by mouth at bedtime as needed for sleep. 30 capsule 0   temazepam (RESTORIL) 15 MG capsule Take 1 capsule (15 mg total) by mouth at bedtime. 30 capsule 5   topiramate (TOPAMAX) 50 MG tablet TAKE 1 TABLET BY MOUTH AT  BEDTIME 60 tablet 1   UNABLE TO FIND Compression hose 15-30mmhg knee high Dx leg swelling 1 each 0   No current facility-administered medications on file prior to visit.    No Known Allergies   Pharmacy: [facility]  Primary Insurance Name: Medicare, [facility]  Best number where you can be reached: [phone]

## 2023-02-07 NOTE — Telephone Encounter (Signed)
Any room, clearance to hold Plavix Thanks 

## 2023-02-08 ENCOUNTER — Telehealth (INDEPENDENT_AMBULATORY_CARE_PROVIDER_SITE_OTHER): Payer: Self-pay | Admitting: Gastroenterology

## 2023-02-08 NOTE — Telephone Encounter (Signed)
Medication clearance sent to PCP 

## 2023-02-08 NOTE — Telephone Encounter (Signed)
    02/08/23  Frankie A Dahmen [birth year]  What type of surgery is being performed? Colonoscopy/EGD  When is surgery scheduled? TBD  Clearance to hold Plavix  Name of physician performing surgery?  Dr. Katrinka Blazing [facility] at [facility] [place] [facility] Phone: [phone] Fax: [phone]  Anethesia type (none, local, MAC, general)? MAC

## 2023-02-11 NOTE — Telephone Encounter (Signed)
Per Dr.Simpson;YES, MAY HOLD PLAVIX FOR 5 DAYS PRIOR TO SURGERY

## 2023-02-13 NOTE — Telephone Encounter (Signed)
Left message with husband to return call. (Husband states pt is in [place] at this time)

## 2023-02-19 ENCOUNTER — Other Ambulatory Visit: Payer: Self-pay

## 2023-02-19 MED ORDER — METOPROLOL SUCCINATE ER 25 MG PO TB24: 12.5000 mg | ORAL_TABLET | Freq: Every day | ORAL | 3 refills | Status: DC

## 2023-02-27 ENCOUNTER — Other Ambulatory Visit: Payer: Self-pay | Admitting: Family Medicine

## 2023-02-27 DIAGNOSIS — I10 Essential (primary) hypertension: Secondary | ICD-10-CM

## 2023-03-01 ENCOUNTER — Other Ambulatory Visit: Payer: Self-pay | Admitting: Family Medicine

## 2023-03-14 ENCOUNTER — Ambulatory Visit: Payer: Medicare Other | Attending: Nurse Practitioner | Admitting: Nurse Practitioner

## 2023-03-14 ENCOUNTER — Encounter: Payer: Self-pay | Admitting: Nurse Practitioner

## 2023-03-14 VITALS — BP 144/80 | HR 60 | Ht 64.0 in | Wt 139.2 lb

## 2023-03-14 DIAGNOSIS — I779 Disorder of arteries and arterioles, unspecified: Secondary | ICD-10-CM

## 2023-03-14 DIAGNOSIS — M1990 Unspecified osteoarthritis, unspecified site: Secondary | ICD-10-CM

## 2023-03-14 DIAGNOSIS — E785 Hyperlipidemia, unspecified: Secondary | ICD-10-CM | POA: Diagnosis not present

## 2023-03-14 DIAGNOSIS — R42 Dizziness and giddiness: Secondary | ICD-10-CM

## 2023-03-14 DIAGNOSIS — I2510 Atherosclerotic heart disease of native coronary artery without angina pectoris: Secondary | ICD-10-CM

## 2023-03-14 DIAGNOSIS — R600 Localized edema: Secondary | ICD-10-CM

## 2023-03-14 DIAGNOSIS — I10 Essential (primary) hypertension: Secondary | ICD-10-CM

## 2023-03-14 DIAGNOSIS — M542 Cervicalgia: Secondary | ICD-10-CM | POA: Diagnosis not present

## 2023-03-14 NOTE — Patient Instructions (Signed)
Medication Instructions:   Continue all current medications.    Labwork:  none  Testing/Procedures:  none  Follow-Up:  3-4 months   Any Other Special Instructions Will Be Listed Below (If Applicable).  You have been referred to Rheumatology   If you need a refill on your cardiac medications before your next appointment, please call your pharmacy.

## 2023-03-14 NOTE — Progress Notes (Signed)
Office Visit    Patient Name: Tanya [name] Date of Encounter: 03/14/2023  PCP:  Tanya Perches, MD   [facility]  Cardiologist:  Tanya Rich, MD  Advanced Practice Provider:  No care team member to display Electrophysiologist:  None   Chief Complaint    Karianne A [name] is a 72 y.o. female with a hx of CAD, hypertension, hyperlipidemia, DOE, leg edema, and joint pain/arthritis, who presents today for follow-up.   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 s/p DESx2 to proximal LAD in 06/2018 b. cath in 04/2019 showing 40% mid-LAD ISR with no significant stenosis along LM, LCx, or RCA   Cystitis    Depression    Elevated liver enzymes    Fatty liver    GERD (gastroesophageal reflux disease)    Hard of hearing    Headache    migraine   Hyperlipidemia    Hypertension    Insomnia    Migraine    Osteoarthritis    Unspecified hypothyroidism    Vaginitis    Wears glasses    Wears hearing aid    B/L   Past Surgical History:  Procedure Laterality Date   BIOPSY N/A 01/18/2021   Procedure: BIOPSY;  Surgeon: Dolores Frame, MD;  Location: [facility];  Service: Gastroenterology;  Laterality: N/A;   BREAST BIOPSY Right    unsure when/benign   CATARACT EXTRACTION W/PHACO Right 05/08/2019   Procedure: CATARACT EXTRACTION PHACO AND INTRAOCULAR LENS PLACEMENT (IOC);  Surgeon: Fabio Pierce, MD;  Location: [facility];  Service: Ophthalmology;  Laterality: Right;  CDE: 10.02   COLONOSCOPY N/A 02/08/2016   Procedure: COLONOSCOPY;  Surgeon: Malissa Hippo, MD;  Location: [facility];  Service: Endoscopy;  Laterality: N/A;  1200 - moved to 11:45 - Ann to notify   CORONARY PRESSURE/FFR STUDY N/A 06/06/2018   Procedure: INTRAVASCULAR PRESSURE WIRE/FFR STUDY;  Surgeon: Swaziland, [name], MD;   Location: [facility] INVASIVE CV LAB;  Service: Cardiovascular;  Laterality: N/A;   CORONARY STENT INTERVENTION N/A 06/06/2018   Procedure: CORONARY STENT INTERVENTION;  Surgeon: Swaziland, [name], MD;  Location: [facility] INVASIVE CV LAB;  Service: Cardiovascular;  Laterality: N/A;   ESOPHAGOGASTRODUODENOSCOPY (EGD) WITH PROPOFOL N/A 01/18/2021   Procedure: ESOPHAGOGASTRODUODENOSCOPY (EGD) WITH PROPOFOL;  Surgeon: Dolores Frame, MD;  Location: [facility];  Service: Gastroenterology;  Laterality: N/A;  10:30AM   IR ANGIO INTRA EXTRACRAN SEL COM CAROTID INNOMINATE BILAT MOD SED  09/01/2019   IR ANGIO INTRA EXTRACRAN SEL INTERNAL CAROTID UNI L MOD SED  12/21/2019   IR ANGIO VERTEBRAL SEL VERTEBRAL BILAT MOD SED  09/01/2019   IR ANGIOGRAM FOLLOW UP STUDY  12/21/2019   IR TRANSCATH/EMBOLIZ  12/21/2019   LEFT HEART CATH AND CORONARY ANGIOGRAPHY N/A 04/24/2019   Procedure: LEFT HEART CATH AND CORONARY ANGIOGRAPHY;  Surgeon: Lyn Records, MD;  Location: [facility] INVASIVE CV LAB;  Service: Cardiovascular;  Laterality: N/A;   LEFT HEART CATHETERIZATION WITH CORONARY ANGIOGRAM N/A 11/19/2014   Procedure: LEFT HEART CATHETERIZATION WITH CORONARY ANGIOGRAM;  Surgeon: Marykay Lex, MD;  Location: [facility] C[facility] LAB;  Service: Cardiovascular;  Laterality: N/A;   RADIOLOGY WITH ANESTHESIA N/A 11/23/2019   Procedure: Otho Bellows;  Surgeon: Julieanne Cotton, MD;  Location: [facility] OR;  Service: Radiology;  Laterality: N/A;   RADIOLOGY WITH ANESTHESIA N/A 12/21/2019   Procedure: EMBOLIZATION;  Surgeon: Julieanne Cotton, MD;  Location: [facility] OR;  Service: Radiology;  Laterality: N/A;   RIGHT/LEFT HEART CATH AND CORONARY ANGIOGRAPHY N/A 06/06/2018   Procedure: RIGHT/LEFT HEART CATH AND CORONARY ANGIOGRAPHY;  Surgeon: Swaziland, [name], MD;  Location: [facility] INVASIVE CV LAB;  Service: Cardiovascular;  Laterality: N/A;    Allergies  No Known Allergies  History of Present Illness    Tanya [name] is a 72 y.o. female with a PMH  as mentioned above.  Previous cardiovascular history includes 2 drug-eluting stents placed to proximal LAD in 2019.  Cardiac catheterization in 2020 revealed 40% mid LAD ISR, no significant stenosis along left circumflex, RCA, left main.  Last seen by Dr. Dina [name] on June 08, 2022.  Was overall doing well at that time.  It was suggested to try lowering Norvasc to 5 mg daily to help improve left leg swelling, recommended that this medication could possibly be stopped in the future.  Last saw her for chest pain evaluation on January 28, 2023. Reported neck pain, x several months, noticed rare episodes of shooting chest pain on mid to right side of her chest, brief, no exertional symptoms, difficult to describe. Denied associated symptoms. Carotid doppler result noted below.   Today she presents for follow-up. Patient is a difficult historian d/t language barrier. Reports neck/shoulder pain and pain in digits, says she sees a doctor for arthritis. Denies any chest pain, shortness of breath, palpitations, syncope, presyncope, orthopnea, PND, swelling or significant weight changes, acute bleeding, or claudication. Notes dizziness when standing up at times.   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   EKGs/Labs/Other Studies Reviewed:   The following studies were reviewed today:   EKG:  EKG is not ordered today.    Carotid doppler 01/2023:  IMPRESSION: 1. Atherosclerotic plaque involving bilateral carotid arteries. Estimated degree of stenosis in the internal carotid arteries is less than 50% bilaterally. 2. Patent vertebral arteries with antegrade flow.  Echocardiogram 08/2021: 1. Left ventricular ejection fraction, by estimation, is 60 to 65%. The  left ventricle has normal function. The left ventricle has no regional  wall motion abnormalities. Left ventricular diastolic parameters are  indeterminate.   2. Right ventricular systolic  function is normal. The right ventricular  size is normal. Tricuspid regurgitation signal is inadequate for assessing  PA pressure.   3. The mitral valve is grossly normal. Mild mitral valve regurgitation.   4. The aortic valve is tricuspid. Aortic valve regurgitation is not  visualized.   Comparison(s): Prior images reviewed side by side. LVEF remains normal  range.  Carotid Doppler 12/2020: IMPRESSION: Moderate amount of bilateral atherosclerotic plaque, progressed compared to the 2013 examination though not resulting in a hemodynamically significant stenosis within either internal carotid artery.  Cardiac monitor 2021: Sinus rhythm with rare PACs and PVCs and short bursts of SVT (possibly atrial tachycardia).  Left heart cath 04/2019: 40% eccentric mid LAD in-stent restenosis.  At completion of stent procedure in September 2020, there was residual stent deformity in this region.  There is no high-grade obstruction in the LAD stent.  The LAD is otherwise normal. Normal left main Normal circumflex Normal RCA Normal LV systolic function and end-diastolic pressure   RECOMMENDATIONS:   Dyspnea cannot be explained by findings on today's study based on absence of significant coronary obstruction and normal LV hemodynamics. Continue  dual antiplatelet therapy beyond 12 months due to overlapping stents in the LAD.  Right and left heart cath 06/2018: Prox LAD lesion is 80% stenosed. Post intervention, there is a 0% residual stenosis. A drug-eluting stent was successfully placed using a STENT SYNERGY DES 3.5X20. A drug-eluting stent was successfully placed using a STENT SYNERGY DES 3X12. The left ventricular systolic function is normal. LV end diastolic pressure is normal. The left ventricular ejection fraction is 55-65% by visual estimate.   1. Single vessel obstructive CAD involving the mid LAD immediately after a large diagonal. Abnormal FFR of 0.68. 2. Normal LV function 3. Low LV  filling pressures 4. Normal right heart pressures 5. Normal cardiac output 6. Successful PCI of the proximal to mid LAD with DES x 2.    Plan: patient is a candidate for same day DC. Will switch Prilosec to Protonix. Will changed to high dose statin therapy.   Recommend uninterrupted dual antiplatelet therapy with Aspirin 81mg  daily and Clopidogrel 75mg  daily for a minimum of 6 months (stable ischemic heart disease - Class I recommendation).  Myoview 03/2018: Blood pressure demonstrated a hypertensive response to exercise. There was no ST segment deviation noted during stress. The study is normal. There are no perfusion defects. This is a low risk study. Duke treadmill score of 11 consistent with low risk for major cardiac events, excellent exercise function capacity (11 minutes, 11 METs) The left ventricular ejection fraction is hyperdynamic (>65%).  Recent Labs: 04/06/2022: BNP 49.1 11/16/2022: ALT 23; BUN 10; Creatinine, Ser 0.69; Hemoglobin 12.2; Platelets 294; Potassium 4.2; Sodium 140; TSH 3.000  Recent Lipid Panel    Component Value Date/Time   CHOL 114 11/16/2022 1031   TRIG 59 11/16/2022 1031   HDL 64 11/16/2022 1031   CHOLHDL 1.8 11/16/2022 1031   CHOLHDL 2.0 01/27/2020 1022   VLDL 11 06/29/2019 0946   LDLCALC 37 11/16/2022 1031   LDLCALC 44 01/27/2020 1022     Home Medications   Current Meds  Medication Sig   acetaminophen (TYLENOL) 500 MG tablet Take 1,000 mg by mouth every 6 (six) hours as needed for moderate pain.   amLODipine (NORVASC) 5 MG tablet Take 1 tablet (5 mg total) by mouth daily.   Ascorbic Acid (VITAMIN C) 1000 MG tablet Take 1,000 mg by mouth daily.   aspirin EC 81 MG tablet Take 81 mg by mouth daily. Swallow whole.   cholecalciferol (VITAMIN D3) 25 MCG (1000 UNIT) tablet Take 1,000 Units by mouth daily.   cloNIDine (CATAPRES) 0.3 MG tablet Take 1 tablet (0.3 mg total) by mouth at bedtime.   clopidogrel (PLAVIX) 75 MG tablet TAKE 1 TABLET BY MOUTH  DAILY   metoprolol succinate (TOPROL-XL) 25 MG 24 hr tablet Take 0.5 tablets (12.5 mg total) by mouth daily.   nitroGLYCERIN (NITROSTAT) 0.4 MG SL tablet Place 1 tablet (0.4 mg total) under the tongue every 5 (five) minutes as needed.   omeprazole (PRILOSEC) 40 MG capsule TAKE 1 CAPSULE BY MOUTH DAILY   PARoxetine (PAXIL) 10 MG tablet TAKE 1 TABLET BY MOUTH ONCE  DAILY   Potassium Chloride ER 20 MEQ TBCR TAKE 1 TABLET BY MOUTH DAILY   temazepam (RESTORIL) 15 MG capsule Take 1 capsule (15 mg total) by mouth at bedtime as needed for sleep.   topiramate (TOPAMAX) 50 MG tablet TAKE 1 TABLET BY MOUTH AT  BEDTIME   UNABLE TO FIND Compression hose 15-101mmhg knee high Dx leg swelling     Review of Systems  All other systems reviewed and are otherwise negative except as noted above.  Physical Exam    VS:  BP (!) 144/80   Pulse 60   Ht 5\' 4"  (1.626 m)   Wt 139 lb 3.2 oz (63.1 kg)   SpO2 95%   BMI 23.89 kg/m  , BMI Body mass index is 23.89 kg/m.  Wt Readings from Last 3 Encounters:  03/14/23 139 lb 3.2 oz (63.1 kg)  01/28/23 138 lb 12.8 oz (63 kg)  11/13/22 139 lb 6.4 oz (63.2 kg)    Repeat BP: 130/57  GEN: Well nourished, well developed, in no acute distress. HEENT: normal. Neck: Supple, no JVD, carotid bruits, or masses. Cardiac: S1/S2, RRR, no murmurs, rubs, or gallops. No clubbing, cyanosis.  Stable, nonpitting edema along BLE.  Radials/PT 2+ and equal bilaterally.  Respiratory:  Respirations regular and unlabored, clear to auscultation bilaterally. 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 Skin: Warm and dry, no rash. Neuro:  Strength and sensation are intact. Psych: Normal affect.  Assessment & Plan   CAD Hx of atypical chest pain, denies any recent symptoms. Past EKG was reassuring today. Continue current medication regimen.  ED precautions discussed.  HTN Blood pressure mildly elevated.  Discussed to monitor BP at home at least 2 hours after medications and sitting for 5-10 minutes.  No medication changes at this time. Heart healthy diet and regular cardiovascular exercise encouraged.   Leg edema Reports stable left lower extremity swelling, appears this is due to chronic venous insufficiency.  Appears equal on both sides.  Recommended/discussed compression stockings.  No medication changes at this time. Heart healthy diet and regular cardiovascular exercise encouraged.   Orthostatic dizziness Rare in occurrence, not bothersome per her report. Discussed conservative measures, and she verbalized understanding. Reviewed labs from February 2024, WNL. If no improvement by next follow-up, plan to arrange orthostatics.   5. Neck pain, carotid artery disease, HLD Continues to note neck pain. Carotid Doppler 01/2023 revealed < 50% stenosis along IC's.  LDL 37 from 11/2022.  Continue current medication regimen. Heart healthy diet and regular cardiovascular exercise encouraged.   5. Arthritis, Rheumatoid arthritis? Pt exhibits signs and symptoms of early stages of RA. Chart reviewed and PCP also has mentioned high suspicion of RA in previous notes. 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 Will refer to Rheumatology for further workup and evaluation of RA.      Disposition: Follow up in 3-4 months with Tanya Rich, MD or APP.  Signed, Sharlene Dory, NP 03/14/2023, 10:52 AM [facility]

## 2023-03-22 ENCOUNTER — Other Ambulatory Visit: Payer: Self-pay | Admitting: Diagnostic Neuroimaging

## 2023-04-26 ENCOUNTER — Other Ambulatory Visit: Payer: Self-pay | Admitting: Diagnostic Neuroimaging

## 2023-05-08 NOTE — Progress Notes (Deleted)
[facility]  PATIENT: Tanya [name] DOB: 11-Jan-1951  REFERRING CLINICIAN: Kerri Perches, MD HISTORY FROM: patient and husband REASON FOR VISIT: Headache follow up   HISTORICAL  CHIEF COMPLAINT:  No chief complaint on file.   HISTORY OF PRESENT ILLNESS:   Update 05/09/2023 JM: Patient returns for ***.   Continues on topiramate 50 mg nightly, reports headaches ***    UPDATE (10/30/21, VRP): Since last visit, doing well. Only rare headaches while on topiramate 50mg  at bedtime. Tried aimovig and nurtec, but not that much better, and also expensive.   PRIOR HPI (04/19/21, VRP): 72 year old female here for evaluation of headaches.  Patient has had headaches since age 50 years old with global severe throbbing headaches, nausea vomiting, sensitive to light and sound.  No visual scotoma or aura.  Headaches were intermittent throughout her life.  Headaches have worsened in the last 1 to 2 years.  Patient now on topiramate which has slightly helped.  September 2020 patient had ER evaluation for chest pain, headache, numbness.  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  She was referred to neurologist Dr. Gerilyn Pilgrim.  He referred patient to Dr. Corliss Skains for aneurysm evaluation.  Although aneurysm was 3 mm in size and not felt to be symptomatic, this was treated with endovascular embolization.  Patient has been on topiramate for past 1 year for migraine treatment.  This is slightly helped symptoms but patient continues to have almost daily headaches.  Patient feeling more headaches on the right side.  Also complaining of memory loss issues.  Patient is very hard of hearing and does not use her hearing aids.  She maintains all her ADLs.  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  Patient has been having issues with anxiety  and depression on medication.  Has also had problems with insomnia in the past.    REVIEW OF SYSTEMS: Full 14 system review of systems performed and negative with exception of: as per HPI.  ALLERGIES: No Known Allergies  HOME MEDICATIONS: Outpatient Medications Prior to Visit  Medication Sig Dispense Refill   acetaminophen (TYLENOL) 500 MG tablet Take 1,000 mg by mouth every 6 (six) hours as needed for moderate pain.     amLODipine (NORVASC) 5 MG tablet Take 1 tablet (5 mg total) by mouth daily. 90 tablet 3   Ascorbic Acid (VITAMIN C) 1000 MG tablet Take 1,000 mg by mouth daily.     aspirin EC 81 MG tablet Take 81 mg by mouth daily. Swallow whole.     cholecalciferol (VITAMIN D3) 25 MCG (1000 UNIT) tablet Take 1,000 Units by mouth daily.     cloNIDine (CATAPRES) 0.3 MG tablet Take 1 tablet (0.3 mg total) by mouth at bedtime. 5 tablet 0   clopidogrel (PLAVIX) 75 MG tablet TAKE 1 TABLET BY MOUTH DAILY 100 tablet 2   metoprolol succinate (TOPROL-XL) 25 MG 24 hr tablet Take 0.5 tablets (12.5 mg total) by mouth daily. 45 tablet 3   nitroGLYCERIN (NITROSTAT) 0.4 MG SL tablet Place 1 tablet (0.4 mg total) under the tongue every 5 (five) minutes as needed. 25 tablet 3   omeprazole (PRILOSEC) 40 MG capsule TAKE 1 CAPSULE BY MOUTH DAILY 100 capsule 2   PARoxetine (PAXIL) 10 MG tablet TAKE 1 TABLET BY MOUTH ONCE  DAILY 90 tablet 3  Potassium Chloride ER 20 MEQ TBCR TAKE 1 TABLET BY MOUTH DAILY 90 tablet 3   prednisoLONE acetate (PRED FORTE) 1 % ophthalmic suspension  (Patient not taking: Reported on 03/14/2023)     rosuvastatin (CRESTOR) 40 MG tablet TAKE 1 TABLET BY MOUTH DAILY (Patient not taking: Reported on 03/14/2023) 90 tablet 3   temazepam (RESTORIL) 15 MG capsule Take 1 capsule (15 mg total) by mouth at bedtime as needed for sleep. 30 capsule 0   topiramate (TOPAMAX) 50 MG tablet TAKE 1 TABLET BY MOUTH AT  BEDTIME 20 tablet 1   UNABLE TO FIND Compression hose 15-46mmhg knee high Dx leg  swelling 1 each 0   No facility-administered medications prior to visit.    PAST MEDICAL HISTORY: Past Medical History:  Diagnosis Date   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 s/p DESx2 to proximal LAD in 06/2018 b. cath in 04/2019 showing 40% mid-LAD ISR with no significant stenosis along LM, LCx, or RCA   Cystitis    Depression    Elevated liver enzymes    Fatty liver    GERD (gastroesophageal reflux disease)    Hard of hearing    Headache    migraine   Hyperlipidemia    Hypertension    Insomnia    Migraine    Osteoarthritis    Unspecified hypothyroidism    Vaginitis    Wears glasses    Wears hearing aid    B/L    PAST SURGICAL HISTORY: Past Surgical History:  Procedure Laterality Date   BIOPSY N/A 01/18/2021   Procedure: BIOPSY;  Surgeon: Dolores Frame, MD;  Location: [facility];  Service: Gastroenterology;  Laterality: N/A;   BREAST BIOPSY Right    unsure when/benign   CATARACT EXTRACTION W/PHACO Right 05/08/2019   Procedure: CATARACT EXTRACTION PHACO AND INTRAOCULAR LENS PLACEMENT (IOC);  Surgeon: Fabio Pierce, MD;  Location: [facility];  Service: Ophthalmology;  Laterality: Right;  CDE: 10.02   COLONOSCOPY N/A 02/08/2016   Procedure: COLONOSCOPY;  Surgeon: Malissa Hippo, MD;  Location: [facility];  Service: Endoscopy;  Laterality: N/A;  1200 - moved to 11:45 - Ann to notify   CORONARY PRESSURE/FFR STUDY N/A 06/06/2018   Procedure: INTRAVASCULAR PRESSURE WIRE/FFR STUDY;  Surgeon: Swaziland, [name], MD;  Location: [facility] INVASIVE CV LAB;  Service: Cardiovascular;  Laterality: N/A;   CORONARY STENT INTERVENTION N/A 06/06/2018   Procedure: CORONARY STENT INTERVENTION;  Surgeon: Swaziland, [name], MD;  Location: [facility] INVASIVE CV LAB;  Service: Cardiovascular;  Laterality: N/A;   ESOPHAGOGASTRODUODENOSCOPY (EGD) WITH PROPOFOL N/A 01/18/2021   Procedure: ESOPHAGOGASTRODUODENOSCOPY  (EGD) WITH PROPOFOL;  Surgeon: Dolores Frame, MD;  Location: [facility];  Service: Gastroenterology;  Laterality: N/A;  10:30AM   IR ANGIO INTRA EXTRACRAN SEL COM CAROTID INNOMINATE BILAT MOD SED  09/01/2019   IR ANGIO INTRA EXTRACRAN SEL INTERNAL CAROTID UNI L MOD SED  12/21/2019   IR ANGIO VERTEBRAL SEL VERTEBRAL BILAT MOD SED  09/01/2019   IR ANGIOGRAM FOLLOW UP STUDY  12/21/2019   IR TRANSCATH/EMBOLIZ  12/21/2019   LEFT HEART CATH AND CORONARY ANGIOGRAPHY N/A 04/24/2019   Procedure: LEFT HEART CATH AND CORONARY ANGIOGRAPHY;  Surgeon: Lyn Records, MD;  Location: [facility] INVASIVE CV LAB;  Service: Cardiovascular;  Laterality: N/A;   LEFT HEART CATHETERIZATION WITH CORONARY ANGIOGRAM N/A 11/19/2014   Procedure:  LEFT HEART CATHETERIZATION WITH CORONARY ANGIOGRAM;  Surgeon: Marykay Lex, MD;  Location: [facility] CATH LAB;  Service: Cardiovascular;  Laterality: N/A;   RADIOLOGY WITH ANESTHESIA N/A 11/23/2019   Procedure: Otho Bellows;  Surgeon: Julieanne Cotton, MD;  Location: [facility] OR;  Service: Radiology;  Laterality: N/A;   RADIOLOGY WITH ANESTHESIA N/A 12/21/2019   Procedure: EMBOLIZATION;  Surgeon: Julieanne Cotton, MD;  Location: [facility] OR;  Service: Radiology;  Laterality: N/A;   RIGHT/LEFT HEART CATH AND CORONARY ANGIOGRAPHY N/A 06/06/2018   Procedure: RIGHT/LEFT HEART CATH AND CORONARY ANGIOGRAPHY;  Surgeon: Swaziland, [name], MD;  Location: [facility] INVASIVE CV LAB;  Service: Cardiovascular;  Laterality: N/A;    FAMILY HISTORY: Family History  Problem Relation Age of Onset   Dementia Mother    Heart disease Mother        heart attack    Hypertension Mother    Heart attack Father    Heart disease Father        heart attack    Hypertension Father     SOCIAL HISTORY: Social History   Socioeconomic History   Marital status: Married    Spouse name: Richard   Number of children: 0   Years of education: 12   Highest education level: Not on file  Occupational History   Not on  file  Tobacco Use   Smoking status: Never    Passive exposure: Never   Smokeless tobacco: Never  Vaping Use   Vaping status: Never Used  Substance and Sexual Activity   Alcohol use: Never   Drug use: Never   Sexual activity: Not on file  Other Topics Concern   Not on file  Social History Narrative   [place] been in [place] 20 years  ,lives with husband   Caffeine coffee 1 c daily    Social Determinants of Health   Financial Resource Strain: Low Risk  (04/09/2022)   Overall Financial Resource Strain (CARDIA)    Difficulty of Paying Living Expenses: Not hard at all  Food Insecurity: No Food Insecurity (04/09/2022)   Hunger Vital Sign    Worried About Running Out of Food in the Last Year: Never true    Ran Out of Food in the Last Year: Never true  Transportation Needs: No Transportation Needs (04/09/2022)   PRAPARE - Administrator, Civil Service (Medical): No    Lack of Transportation (Non-Medical): No  Physical Activity: Insufficiently Active (04/09/2022)   Exercise Vital Sign    Days of Exercise per Week: 3 days    Minutes of Exercise per Session: 40 min  Stress: No Stress Concern Present (04/09/2022)   [employer] of Occupational Health - Occupational Stress Questionnaire    Feeling of Stress : Not at all  Social Connections: Moderately Integrated (04/09/2022)   Social Connection and Isolation Panel [NHANES]    Frequency of Communication with Friends and Family: More than three times a week    Frequency of Social Gatherings with Friends and Family: Twice a week    Attends Religious Services: Never    Database administrator or Organizations: Yes    Attends Banker Meetings: Never    Marital Status: Married  Catering manager Violence: Not At Risk (04/05/2021)   Humiliation, Afraid, Rape, and Kick questionnaire    Fear of Current or Ex-Partner: No    Emotionally Abused: No    Physically Abused: No    Sexually Abused: No  PHYSICAL EXAM  GENERAL EXAM/CONSTITUTIONAL: Vitals:  There were no vitals filed for this visit.   There is no height or weight on file to calculate BMI. Wt Readings from Last 3 Encounters:  03/14/23 139 lb 3.2 oz (63.1 kg)  01/28/23 138 lb 12.8 oz (63 kg)  11/13/22 139 lb 6.4 oz (63.2 kg)   Patient is in no distress; well developed, nourished and groomed; neck is supple  CARDIOVASCULAR: Examination of carotid arteries is normal; no carotid bruits Regular rate and rhythm, no murmurs Examination of peripheral vascular system by observation and palpation is normal  EYES: Ophthalmoscopic exam of optic discs and posterior segments is normal; no papilledema or hemorrhages No results found.  MUSCULOSKELETAL: Gait, strength, tone, movements noted in Neurologic exam below  NEUROLOGIC: MENTAL STATUS:     10/31/2021   10:22 AM 04/19/2021   10:34 AM  MMSE - Mini Mental State Exam  Not completed: Unable to complete   Orientation to time 2 4  Orientation to Place 3 3  Registration  2  Attention/ Calculation  0  Recall  0  Language- name 2 objects  2  Language- repeat  0  Language- follow 3 step command  3  Language- read & follow direction  1  Write a sentence  1  Copy design  0  Total score  16   awake, alert, oriented to person, place and time recent and remote memory intact normal attention and concentration language fluent, comprehension intact, naming intact fund of knowledge appropriate  CRANIAL NERVE:  2nd - no papilledema on fundoscopic exam 2nd, 3rd, 4th, 6th - pupils equal and reactive to light, visual fields full to confrontation, extraocular muscles intact, no nystagmus 5th - facial sensation symmetric 7th - facial strength symmetric 8th - hearing intact 9th - palate elevates symmetrically, uvula midline 11th - shoulder shrug symmetric 12th - tongue protrusion midline  MOTOR:  normal bulk and tone, full strength in the BUE, BLE  SENSORY:  normal  and symmetric to light touch, temperature, vibration  COORDINATION:  finger-nose-finger, fine finger movements normal  REFLEXES:  deep tendon reflexes present and symmetric  GAIT/STATION:  narrow based gait     DIAGNOSTIC DATA (LABS, IMAGING, TESTING) - I reviewed patient records, labs, notes, testing and imaging myself where available.  Lab Results  Component Value Date   WBC 4.5 11/16/2022   HGB 12.2 11/16/2022   HCT 36.4 11/16/2022   MCV 96 11/16/2022   PLT 294 11/16/2022      Component Value Date/Time   NA 140 11/16/2022 1031   K 4.2 11/16/2022 1031   CL 106 11/16/2022 1031   CO2 20 11/16/2022 1031   GLUCOSE 95 11/16/2022 1031   GLUCOSE 101 (H) 03/28/2020 1323   BUN 10 11/16/2022 1031   CREATININE 0.69 11/16/2022 1031   CREATININE 0.68 10/21/2019 1019   CALCIUM 9.4 11/16/2022 1031   PROT 6.5 11/16/2022 1031   ALBUMIN 4.2 11/16/2022 1031   AST 24 11/16/2022 1031   ALT 23 11/16/2022 1031   ALKPHOS 69 11/16/2022 1031   BILITOT 0.2 11/16/2022 1031   GFRNONAA >60 03/28/2020 1323   GFRNONAA 89 10/21/2019 1019   GFRAA >60 03/28/2020 1323   GFRAA 103 10/21/2019 1019   Lab Results  Component Value Date   CHOL 114 11/16/2022   HDL 64 11/16/2022   LDLCALC 37 11/16/2022   TRIG 59 11/16/2022   CHOLHDL 1.8 11/16/2022   Lab Results  Component Value Date   HGBA1C 5.8  06/12/2022   No results found for: "VITAMINB12" Lab Results  Component Value Date   TSH 3.000 11/16/2022    12/21/19  Status post endovascular embolization of 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  02/10/21 MRI brain No evidence of acute intracranial abnormality.   Minimal chronic small-vessel ischemic changes within the cerebral white matter, slightly progressed from the brain MRI of 06/29/2019.   Nonspecific chronic microhemorrhage within the anterior left frontal lobe, new from the prior MRI.    ASSESSMENT AND PLAN  72  y.o. year old female here with:  Dx:  No diagnosis found.    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 Kerby Nora  No orders of the defined types were placed in this encounter.  No follow-ups on file.   I spent *** minutes of face-to-face and non-face-to-face time with patient.  This included previsit chart review, lab review, study review, order entry, electronic health record documentation, patient education and discussion regarding above diagnoses and treatment plan and answered all other questions to patient's satisfaction  Ihor Austin, [facility]  [facility] [address] [place], [place] [address]  Phone [phone] Fax [phone] Note: This document was prepared with digital dictation and possible smart phrase technology. Any transcriptional errors that result from this process are unintentional.

## 2023-05-09 ENCOUNTER — Ambulatory Visit: Payer: PRIVATE HEALTH INSURANCE | Admitting: Adult Health

## 2023-05-15 ENCOUNTER — Ambulatory Visit: Payer: Medicare Other | Admitting: Family Medicine

## 2023-05-16 ENCOUNTER — Encounter: Payer: Self-pay | Admitting: Family Medicine

## 2023-05-17 ENCOUNTER — Other Ambulatory Visit: Payer: Self-pay | Admitting: Diagnostic Neuroimaging

## 2023-06-13 ENCOUNTER — Other Ambulatory Visit: Payer: Self-pay | Admitting: Family Medicine

## 2023-06-16 ENCOUNTER — Other Ambulatory Visit: Payer: Self-pay | Admitting: Diagnostic Neuroimaging

## 2023-06-25 ENCOUNTER — Ambulatory Visit: Payer: Medicare Other | Attending: Nurse Practitioner | Admitting: Nurse Practitioner

## 2023-06-25 ENCOUNTER — Encounter: Payer: Self-pay | Admitting: Nurse Practitioner

## 2023-06-25 VITALS — BP 128/68 | HR 62 | Ht 64.0 in | Wt 140.0 lb

## 2023-06-25 DIAGNOSIS — R0781 Pleurodynia: Secondary | ICD-10-CM | POA: Diagnosis not present

## 2023-06-25 DIAGNOSIS — R0602 Shortness of breath: Secondary | ICD-10-CM | POA: Diagnosis not present

## 2023-06-25 DIAGNOSIS — R600 Localized edema: Secondary | ICD-10-CM

## 2023-06-25 DIAGNOSIS — R0609 Other forms of dyspnea: Secondary | ICD-10-CM | POA: Diagnosis not present

## 2023-06-25 DIAGNOSIS — I779 Disorder of arteries and arterioles, unspecified: Secondary | ICD-10-CM | POA: Diagnosis not present

## 2023-06-25 DIAGNOSIS — I10 Essential (primary) hypertension: Secondary | ICD-10-CM | POA: Diagnosis not present

## 2023-06-25 DIAGNOSIS — M1990 Unspecified osteoarthritis, unspecified site: Secondary | ICD-10-CM | POA: Diagnosis not present

## 2023-06-25 DIAGNOSIS — I25119 Atherosclerotic heart disease of native coronary artery with unspecified angina pectoris: Secondary | ICD-10-CM | POA: Diagnosis not present

## 2023-06-25 DIAGNOSIS — E785 Hyperlipidemia, unspecified: Secondary | ICD-10-CM | POA: Diagnosis not present

## 2023-06-25 DIAGNOSIS — M542 Cervicalgia: Secondary | ICD-10-CM

## 2023-06-25 MED ORDER — COMPRESSION BANDAGES KIT: 1.0000 | PACK | Freq: Every day | 0 refills | Status: DC

## 2023-06-25 NOTE — Patient Instructions (Addendum)
Medication Instructions:  Your physician recommends that you continue on your current medications as directed. Please refer to the Current Medication list given to you today.  Labwork: CBC, BMET, BNP, D-Dimer, In 1 week  Testing/Procedures: Your physician has requested that you have an echocardiogram. Echocardiography is a painless test that uses sound waves to create images of your heart. It provides your doctor with information about the size and shape of your heart and how well your heart's chambers and valves are working. This procedure takes approximately one hour. There are no restrictions for this procedure. Please do NOT wear cologne, perfume, aftershave, or lotions (deodorant is allowed). Please arrive 15 minutes prior to your appointment time.  A chest x-ray takes a picture of the organs and structures inside the chest, including the heart, lungs, and blood vessels. This test can show several things, including, whether the heart is enlarges; whether fluid is building up in the lungs; and whether pacemaker / defibrillator leads are still in place.  Follow-Up: Your physician recommends that you schedule a follow-up appointment in: 4-6 weeks   Any Other Special Instructions Will Be Listed Below (If Applicable).   If you need a refill on your cardiac medications before your next appointment, please call your pharmacy.

## 2023-06-25 NOTE — Progress Notes (Signed)
Office Visit    Patient Name: Tanya [name] Date of Encounter: 06/25/2023 PCP:  Kerri Perches, MD [facility]  Cardiologist:  Dina Rich, MD  Advanced Practice Provider:  No care team member to display Electrophysiologist:  None   Chief Complaint    Tanya [name] is a 72 y.o. female with a hx of CAD, hypertension, hyperlipidemia, DOE, leg edema, and joint pain/arthritis, who presents today for follow-up.   Previous cardiovascular history includes 2 drug-eluting stents placed to proximal LAD in 2019.  Cardiac catheterization in 2020 revealed 40% mid LAD ISR, no significant stenosis along left circumflex, RCA, left main.  Last seen by Dr. Dina Rich on June 08, 2022.  Was overall doing well at that time.  It was suggested to try lowering Norvasc to 5 mg daily to help improve left leg swelling, recommended that this medication could possibly be stopped in the future.  Last seen for follow-up on March 14, 2023.  Reported neck/shoulder pain and pain in digits, reported seeing a doctor for her arthritis.  Denied any chest pain, shortness of breath, palpitations, syncope, presyncope, orthopnea, PND, swelling or significant weight changes, acute bleeding, or claudication. Noted dizziness when standing up at times.  Was referred to rheumatology for workup for RA.  Today she presents for follow-up.  Chief concern is shortness of breath that has been going on for a long time, has been getting worse recently, typically noticed with exertion and noticed with lifting something or with exercising.  Ongoing for 1 year.  Says she almost went to the emergency department due to this.  Does admit to leg swelling, worse in the evening and better in the morning.  Feels as though she has a fever, denies any actual fever, admits to cold intolerance.  Admits to chest heaviness, but points along epigastric area and says this wraps around her back.  Says she believes  this is radiating from her shoulder pain.  Neck pain appears the same. Denies any palpitations, syncope, presyncope, dizziness, orthopnea, PND, significant weight changes, acute bleeding, or claudication.  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   EKGs/Labs/Other Studies Reviewed:   The following studies were reviewed today:   EKG:  EKG is not ordered today.    Carotid doppler 01/2023:  IMPRESSION: 1. Atherosclerotic plaque involving bilateral carotid arteries. Estimated degree of stenosis in the internal carotid arteries is less than 50% bilaterally. 2. Patent vertebral arteries with antegrade flow.  Echocardiogram 08/2021: 1. Left ventricular ejection fraction, by estimation, is 60 to 65%. The  left ventricle has normal function. The left ventricle has no regional  wall motion abnormalities. Left ventricular diastolic parameters are  indeterminate.   2. Right ventricular systolic function is normal. The right ventricular  size is normal. Tricuspid regurgitation signal is inadequate for assessing  PA pressure.   3. The mitral valve is grossly normal. Mild mitral valve regurgitation.   4. The aortic valve is tricuspid. Aortic valve regurgitation is not  visualized.   Comparison(s): Prior images reviewed side by side. LVEF remains normal  range.  Carotid Doppler 12/2020: IMPRESSION: Moderate amount of bilateral atherosclerotic plaque, progressed compared to the 2013 examination though not resulting in a hemodynamically significant stenosis within either internal carotid artery.  Cardiac monitor 2021: Sinus rhythm with rare PACs and PVCs and short bursts of SVT (possibly atrial tachycardia).  Left heart cath 04/2019: 40% eccentric mid LAD in-stent restenosis.  At completion of stent procedure in September 2020, there was residual stent deformity in this region.  There is no high-grade obstruction in the LAD stent.  The LAD is otherwise  normal. Normal left main Normal circumflex Normal RCA Normal LV systolic function and end-diastolic pressure   RECOMMENDATIONS:   Dyspnea cannot be explained by findings on today's study based on absence of significant coronary obstruction and normal LV hemodynamics. Continue dual antiplatelet therapy beyond 12 months due to overlapping stents in the LAD.  Right and left heart cath 06/2018: Prox LAD lesion is 80% stenosed. Post intervention, there is a 0% residual stenosis. A drug-eluting stent was successfully placed using a STENT SYNERGY DES 3.5X20. A drug-eluting stent was successfully placed using a STENT SYNERGY DES 3X12. The left ventricular systolic function is normal. LV end diastolic pressure is normal. The left ventricular ejection fraction is 55-65% by visual estimate.   1. Single vessel obstructive CAD involving the mid LAD immediately after a large diagonal. Abnormal FFR of 0.68. 2. Normal LV function 3. Low LV filling pressures 4. Normal right heart pressures 5. Normal cardiac output 6. Successful PCI of the proximal to mid LAD with DES x 2.    Plan: patient is a candidate for same day DC. Will switch Prilosec to Protonix. Will changed to high dose statin therapy.   Recommend uninterrupted dual antiplatelet therapy with Aspirin 81mg  daily and Clopidogrel 75mg  daily for a minimum of 6 months (stable ischemic heart disease - Class I recommendation).  Myoview 03/2018: Blood pressure demonstrated a hypertensive response to exercise. There was no ST segment deviation noted during stress. The study is normal. There are no perfusion defects. This is a low risk study. Duke treadmill score of 11 consistent with low risk for major cardiac events, excellent exercise function capacity (11 minutes, 11 METs) The left ventricular ejection fraction is hyperdynamic (>65%).  Review of Systems    All other systems reviewed and are otherwise negative except as noted above.  Physical  Exam    VS:  BP 128/68   Pulse 62   Ht 5\' 4"  (1.626 m)   Wt 140 lb (63.5 kg)   SpO2 97%   BMI 24.03 kg/m  , BMI Body mass index is 24.03 kg/m.  Wt Readings from Last 3 Encounters:  06/25/23 140 lb (63.5 kg)  03/14/23 139 lb 3.2 oz (63.1 kg)  01/28/23 138 lb 12.8 oz (63 kg)    GEN: Well nourished, well developed, in no acute distress. HEENT: normal. Neck: Supple, no JVD, carotid bruits, or masses. Cardiac: S1/S2, RRR, no murmurs, rubs, or gallops. No clubbing, cyanosis.  Stable, nonpitting edema along BLE.  Radials/PT 2+ and equal bilaterally.  Respiratory:  Respirations regular and unlabored, clear to auscultation bilaterally. 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 Skin: Warm and dry, no rash. Neuro:  Strength and sensation are intact. Psych: Normal affect.  Assessment & Plan   CAD Hx of atypical chest pain, denies any recent symptoms. Past EKG was reassuring. Continue current medication regimen.  ED precautions discussed.  HTN Blood pressure stable today. Discussed to monitor BP at home at least 2 hours after medications and sitting for 5-10 minutes.  No medication changes at this time. Heart healthy diet and regular cardiovascular exercise encouraged.   Leg edema Reports stable leg edema. Will write Rx for compression stockings.  No medication changes at this time. Heart healthy  diet and regular cardiovascular exercise encouraged.   5. Neck pain, carotid artery disease, HLD Continues to note neck pain. Carotid Doppler 01/2023 revealed < 50% stenosis along IC's.  LDL 37 from 11/2022.  Continue current medication regimen. Heart healthy diet and regular cardiovascular exercise encouraged.   5. Arthritis, Rheumatoid arthritis? Pt exhibits signs and symptoms of early stages of RA, believe multiple of her symptoms are due to this. Chart reviewed and PCP also has mentioned  high suspicion of RA in previous notes. 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  Previously referred to Rheumatology for further workup and evaluation of RA.   6. Shortness of breath/DOE, pleuritic pain Etiology unclear. Low suspicion for ACS - believe possible RA is contributing. Will obtain CBC, BMET, proBNP, and D-dimer. Will obtain Echo and 2 view CXR to r/o CAP. Heart healthy diet encouraged. Care and ED precautions discussed.     Disposition: Follow up in 4-6 weeks with Dina Rich, MD or APP.  Signed, Sharlene Dory, NP 06/25/2023, 4:59 PM [facility]

## 2023-07-04 DIAGNOSIS — R0602 Shortness of breath: Secondary | ICD-10-CM | POA: Diagnosis not present

## 2023-07-04 DIAGNOSIS — R0609 Other forms of dyspnea: Secondary | ICD-10-CM | POA: Diagnosis not present

## 2023-07-04 DIAGNOSIS — I2510 Atherosclerotic heart disease of native coronary artery without angina pectoris: Secondary | ICD-10-CM | POA: Diagnosis not present

## 2023-07-04 DIAGNOSIS — E559 Vitamin D deficiency, unspecified: Secondary | ICD-10-CM | POA: Diagnosis not present

## 2023-07-05 LAB — CBC
Hematocrit: 39.4 % (ref 34.0–46.6)
Hemoglobin: 13 g/dL (ref 11.1–15.9)
MCH: 31.8 pg (ref 26.6–33.0)
MCHC: 33 g/dL (ref 31.5–35.7)
MCV: 96 fL (ref 79–97)
Platelets: 306 10*3/uL (ref 150–450)
RBC: 4.09 x10E6/uL (ref 3.77–5.28)
RDW: 13 % (ref 11.7–15.4)
WBC: 4.8 10*3/uL (ref 3.4–10.8)

## 2023-07-05 LAB — D-DIMER, QUANTITATIVE: D-DIMER: 0.77 mg{FEU}/L — ABNORMAL HIGH (ref 0.00–0.49)

## 2023-07-06 LAB — BRAIN NATRIURETIC PEPTIDE: BNP: 45.5 pg/mL (ref 0.0–100.0)

## 2023-07-09 ENCOUNTER — Ambulatory Visit: Payer: Medicare Other | Attending: Nurse Practitioner

## 2023-07-09 DIAGNOSIS — R0609 Other forms of dyspnea: Secondary | ICD-10-CM | POA: Diagnosis not present

## 2023-07-09 DIAGNOSIS — R0602 Shortness of breath: Secondary | ICD-10-CM | POA: Diagnosis not present

## 2023-07-09 DIAGNOSIS — I25119 Atherosclerotic heart disease of native coronary artery with unspecified angina pectoris: Secondary | ICD-10-CM | POA: Diagnosis not present

## 2023-07-10 LAB — ECHOCARDIOGRAM COMPLETE
AR max vel: 2.39 cm2
AV Area VTI: 2.28 cm2
AV Area mean vel: 2.48 cm2
AV Mean grad: 5 mm[Hg]
AV Peak grad: 10.1 mm[Hg]
Ao pk vel: 1.59 m/s
Area-P 1/2: 3.2 cm2
Calc EF: 64.2 %
MV VTI: 3.09 cm2
S' Lateral: 2.1 cm
Single Plane A2C EF: 67.6 %
Single Plane A4C EF: 63.8 %

## 2023-07-13 ENCOUNTER — Other Ambulatory Visit: Payer: Self-pay | Admitting: Diagnostic Neuroimaging

## 2023-07-18 ENCOUNTER — Encounter: Payer: Self-pay | Admitting: Family Medicine

## 2023-07-18 ENCOUNTER — Other Ambulatory Visit (HOSPITAL_COMMUNITY): Payer: Self-pay | Admitting: Family Medicine

## 2023-07-18 ENCOUNTER — Encounter (INDEPENDENT_AMBULATORY_CARE_PROVIDER_SITE_OTHER): Payer: Self-pay | Admitting: *Deleted

## 2023-07-18 ENCOUNTER — Ambulatory Visit (INDEPENDENT_AMBULATORY_CARE_PROVIDER_SITE_OTHER): Payer: Medicare Other | Admitting: Family Medicine

## 2023-07-18 VITALS — BP 117/70 | HR 68 | Ht 64.0 in | Wt 143.0 lb

## 2023-07-18 DIAGNOSIS — G8929 Other chronic pain: Secondary | ICD-10-CM | POA: Diagnosis not present

## 2023-07-18 DIAGNOSIS — M5442 Lumbago with sciatica, left side: Secondary | ICD-10-CM | POA: Diagnosis not present

## 2023-07-18 DIAGNOSIS — K219 Gastro-esophageal reflux disease without esophagitis: Secondary | ICD-10-CM | POA: Diagnosis not present

## 2023-07-18 DIAGNOSIS — Z23 Encounter for immunization: Secondary | ICD-10-CM

## 2023-07-18 DIAGNOSIS — I10 Essential (primary) hypertension: Secondary | ICD-10-CM

## 2023-07-18 DIAGNOSIS — Z1231 Encounter for screening mammogram for malignant neoplasm of breast: Secondary | ICD-10-CM

## 2023-07-18 DIAGNOSIS — Z1211 Encounter for screening for malignant neoplasm of colon: Secondary | ICD-10-CM

## 2023-07-18 DIAGNOSIS — F5101 Primary insomnia: Secondary | ICD-10-CM

## 2023-07-18 DIAGNOSIS — F419 Anxiety disorder, unspecified: Secondary | ICD-10-CM

## 2023-07-18 DIAGNOSIS — E7849 Other hyperlipidemia: Secondary | ICD-10-CM

## 2023-07-18 DIAGNOSIS — Z780 Asymptomatic menopausal state: Secondary | ICD-10-CM | POA: Diagnosis not present

## 2023-07-18 DIAGNOSIS — D126 Benign neoplasm of colon, unspecified: Secondary | ICD-10-CM

## 2023-07-18 DIAGNOSIS — I2510 Atherosclerotic heart disease of native coronary artery without angina pectoris: Secondary | ICD-10-CM

## 2023-07-18 MED ORDER — TEMAZEPAM 15 MG PO CAPS
15.0000 mg | ORAL_CAPSULE | Freq: Every evening | ORAL | 5 refills | Status: DC | PRN
Start: 2023-07-18 — End: 2024-01-22

## 2023-07-18 NOTE — Patient Instructions (Addendum)
Annual exam in  Feb , call if you need me sooner  Flu vaccine today  Please schedule dexa and mammogram at checkout  You Need Covid vaccine, RSV vaccine and TdAP vaccine , all 3 you get at your pharmacy  You are referred for your colonoscopy, this is past due , you MUST fill out paperwork that will be mailed to you to get it done    You need a bone density test to check for thinning of the bones, we are scheduling this foer you  Please get fasting lipid, cmp and eGFRv in next 1 week  Thanks for choosing [facility], we consider it a privelige to serve you.

## 2023-07-22 DIAGNOSIS — Z23 Encounter for immunization: Secondary | ICD-10-CM | POA: Insufficient documentation

## 2023-07-22 NOTE — Assessment & Plan Note (Signed)
After obtaining informed consent, the vaccine is  administered , with no adverse effect noted at the time of administration.  

## 2023-07-22 NOTE — Assessment & Plan Note (Signed)
No current or recent flare

## 2023-07-22 NOTE — Assessment & Plan Note (Signed)
Hyperlipidemia:Low fat diet discussed and encouraged.   Lipid Panel  Lab Results  Component Value Date   CHOL 114 11/16/2022   HDL 64 11/16/2022   LDLCALC 37 11/16/2022   TRIG 59 11/16/2022   CHOLHDL 1.8 11/16/2022     Updated lab needed at/ before next visit.

## 2023-07-22 NOTE — Assessment & Plan Note (Signed)
Controlled, no change in medication  

## 2023-07-22 NOTE — Assessment & Plan Note (Signed)
Stable on plavix, followed by Cardiology

## 2023-07-22 NOTE — Assessment & Plan Note (Signed)
Colonoscopy past due referred

## 2023-07-22 NOTE — Progress Notes (Signed)
ENNIS CRITE     MRN: [id number]      DOB: 05-Jul-1951  Chief Complaint  Patient presents with   Follow-up    Follow up, needs flu covid and tdap     HPI Ms. Gutknecht is here for follow up and re-evaluation of chronic medical conditions, medication management and review of any available recent lab and radiology data.  Preventive health is updated, specifically  Cancer screening and Immunization.   Questions or concerns regarding consultations or procedures which the PT has had in the interim are  addressed. The PT denies any adverse reactions to current medications since the last visit.  There are no new concerns.  There are no specific complaints   ROS Denies recent fever or chills. Denies sinus pressure, nasal congestion, ear pain or sore throat. Denies chest congestion, productive cough or wheezing. Denies chest pains, palpitations and leg swelling Denies abdominal pain, nausea, vomiting,diarrhea or constipation.   Denies dysuria, frequency, hesitancy or incontinence. Denies joint pain, swelling and limitation in mobility. Denies headaches, seizures, numbness, or tingling. Denies depression, anxiety or insomnia. Denies skin break down or rash.   PE  BP 117/70 (BP Location: Right Arm, Patient Position: Sitting, Cuff Size: Normal)   Pulse 68   Ht 5\' 4"  (1.626 m)   Wt 143 lb (64.9 kg)   SpO2 98%   BMI 24.55 kg/m   Patient alert and oriented and in no cardiopulmonary distress.  HEENT: No facial asymmetry, EOMI,     Neck supple .  Chest: Clear to auscultation bilaterally.  CVS: S1, S2 no murmurs, no S3.Regular rate.  ABD: Soft non tender.   Ext: No edema  MS: Adequate ROM spine, shoulders, hips and knees.  Skin: Intact, no ulcerations or rash noted.  Psych: Good eye contact, normal affect. Memory intact not anxious or depressed appearing.  CNS: CN 2-12 intact, power,  normal throughout.no focal deficits noted.   Assessment & Plan  Essential  hypertension .comn1 DASH diet and commitment to daily physical activity for a minimum of 30 minutes discussed and encouraged, as a part of hypertension management. The importance of attaining a healthy weight is also discussed.     07/18/2023    8:46 AM 06/25/2023    9:42 AM 03/14/2023    9:12 AM 01/28/2023    9:07 AM 11/13/2022   10:25 AM 09/18/2022    8:47 AM 06/12/2022   10:07 AM  BP/Weight  Systolic BP 117 128 144 122 133 110 119  Diastolic BP 70 68 80 70 80 72 68  Wt. (Lbs) 143 140 139.2 138.8 139.4 138.6 138.12  BMI 24.55 kg/m2 24.03 kg/m2 23.89 kg/m2 23.82 kg/m2 23.93 kg/m2 23.79 kg/m2 23.71 kg/m2       Hyperlipemia Hyperlipidemia:Low fat diet discussed and encouraged.   Lipid Panel  Lab Results  Component Value Date   CHOL 114 11/16/2022   HDL 64 11/16/2022   LDLCALC 37 11/16/2022   TRIG 59 11/16/2022   CHOLHDL 1.8 11/16/2022     Updated lab needed at/ before next visit.   GERD (gastroesophageal reflux disease) Controlled, no change in medication \  CAD (coronary artery disease), native coronary artery Stable on plavix, followed by Cardiology  Low back pain with left-sided sciatica No current or recent flare  Tubular adenoma of colon Colonoscopy past due referred  Insomnia Sleep hygiene reviewed and written information offered also. Prescription sent for  medication needed. Controlled, no change in medication   Anxiety Controlled, no change in  medication   Encounter for immunization After obtaining informed consent, the vaccine is  administered , with no adverse effect noted at the time of administration.

## 2023-07-22 NOTE — Assessment & Plan Note (Signed)
.  comn1 DASH diet and commitment to daily physical activity for a minimum of 30 minutes discussed and encouraged, as a part of hypertension management. The importance of attaining a healthy weight is also discussed.     07/18/2023    8:46 AM 06/25/2023    9:42 AM 03/14/2023    9:12 AM 01/28/2023    9:07 AM 11/13/2022   10:25 AM 09/18/2022    8:47 AM 06/12/2022   10:07 AM  BP/Weight  Systolic BP 117 128 144 122 133 110 119  Diastolic BP 70 68 80 70 80 72 68  Wt. (Lbs) 143 140 139.2 138.8 139.4 138.6 138.12  BMI 24.55 kg/m2 24.03 kg/m2 23.89 kg/m2 23.82 kg/m2 23.93 kg/m2 23.79 kg/m2 23.71 kg/m2

## 2023-07-22 NOTE — Assessment & Plan Note (Signed)
Sleep hygiene reviewed and written information offered also. Prescription sent for  medication needed. Controlled, no change in medication  

## 2023-07-24 ENCOUNTER — Telehealth: Payer: Self-pay | Admitting: Family Medicine

## 2023-07-24 NOTE — Telephone Encounter (Signed)
Not due to fill till 10/28, may fill 07/28/2023

## 2023-07-24 NOTE — Telephone Encounter (Signed)
Pharm need provider ok to release medications early  temazepam (RESTORIL) 15 MG capsule [[id number]]

## 2023-07-25 ENCOUNTER — Telehealth: Payer: Self-pay | Admitting: Nurse Practitioner

## 2023-07-25 ENCOUNTER — Ambulatory Visit: Payer: Medicare Other | Attending: Nurse Practitioner | Admitting: Nurse Practitioner

## 2023-07-25 ENCOUNTER — Encounter: Payer: Self-pay | Admitting: Nurse Practitioner

## 2023-07-25 VITALS — BP 130/76 | HR 75 | Ht 64.0 in | Wt 141.6 lb

## 2023-07-25 DIAGNOSIS — R0609 Other forms of dyspnea: Secondary | ICD-10-CM

## 2023-07-25 DIAGNOSIS — I2699 Other pulmonary embolism without acute cor pulmonale: Secondary | ICD-10-CM

## 2023-07-25 DIAGNOSIS — I10 Essential (primary) hypertension: Secondary | ICD-10-CM | POA: Diagnosis not present

## 2023-07-25 DIAGNOSIS — I779 Disorder of arteries and arterioles, unspecified: Secondary | ICD-10-CM | POA: Diagnosis not present

## 2023-07-25 DIAGNOSIS — M542 Cervicalgia: Secondary | ICD-10-CM | POA: Diagnosis not present

## 2023-07-25 DIAGNOSIS — E785 Hyperlipidemia, unspecified: Secondary | ICD-10-CM

## 2023-07-25 DIAGNOSIS — R0602 Shortness of breath: Secondary | ICD-10-CM

## 2023-07-25 DIAGNOSIS — R7989 Other specified abnormal findings of blood chemistry: Secondary | ICD-10-CM

## 2023-07-25 DIAGNOSIS — M1990 Unspecified osteoarthritis, unspecified site: Secondary | ICD-10-CM

## 2023-07-25 DIAGNOSIS — I2510 Atherosclerotic heart disease of native coronary artery without angina pectoris: Secondary | ICD-10-CM

## 2023-07-25 NOTE — Progress Notes (Unsigned)
Office Visit    Patient Name: Tanya [name] Date of Encounter: 07/25/2023 PCP:  Kerri Perches, MD [facility]  Cardiologist:  Dina Rich, MD  Advanced Practice Provider:  No care team member to display Electrophysiologist:  None   Chief Complaint    Tanya [name] is a 72 y.o. female with a hx of CAD, hypertension, hyperlipidemia, DOE, leg edema, and joint pain/arthritis, who presents today for follow-up.   Previous cardiovascular history includes 2 drug-eluting stents placed to proximal LAD in 2019.  Cardiac catheterization in 2020 revealed 40% mid LAD ISR, no significant stenosis along left circumflex, RCA, left main.  Last seen by Dr. Dina Rich on June 08, 2022.  Was overall doing well at that time.  It was suggested to try lowering Norvasc to 5 mg daily to help improve left leg swelling, recommended that this medication could possibly be stopped in the future.  Last seen for follow-up on March 14, 2023.  Reported neck/shoulder pain and pain in digits, reported seeing a doctor for her arthritis.  Denied any chest pain, shortness of breath, palpitations, syncope, presyncope, orthopnea, PND, swelling or significant weight changes, acute bleeding, or claudication. Noted dizziness when standing up at times.  Was referred to rheumatology for workup for RA.  06/25/2023 - Today she presents for follow-up.  Chief concern is shortness of breath that has been going on for a long time, has been getting worse recently, typically noticed with exertion and noticed with lifting something or with exercising.  Ongoing for 1 year.  Says she almost went to the emergency department due to this.  Does admit to leg swelling, worse in the evening and better in the morning.  Feels as though she has a fever, denies any actual fever, admits to cold intolerance.  Admits to chest heaviness, but points along epigastric area and says this wraps around her back.  Says she  believes this is radiating from her shoulder pain.  Neck pain appears the same. Denies any palpitations, syncope, presyncope, dizziness, orthopnea, PND, significant weight changes, acute bleeding, or claudication.  07/25/2023 -today she presents for follow-up.  Says her breathing seems to be doing a little bit better.  Patient is a poor historian due to language barrier.  Continues to note generalized musculoskeletal soreness. Denies any chest pain, palpitations, syncope, presyncope, dizziness, orthopnea, PND, swelling or significant weight changes, acute bleeding, or claudication.  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   EKGs/Labs/Other Studies Reviewed:   The following studies were reviewed today:   EKG:  EKG is not ordered today.    Echo 07/2023: 1. Left ventricular ejection fraction, by estimation, is 65 to 70%. The  left ventricle has normal function. The left ventricle has no regional  wall motion abnormalities. There is mild left ventricular hypertrophy.  Left ventricular diastolic parameters  are consistent with Grade I diastolic dysfunction (impaired relaxation).  The average left ventricular global longitudinal strain is -23.3 %. The  global longitudinal strain is normal.   2. Right ventricular systolic function is normal. The right ventricular  size is normal. Tricuspid regurgitation signal is inadequate for assessing  PA pressure.   3. The mitral valve is normal in structure. Mild mitral valve  regurgitation. No evidence of mitral stenosis.   4. The aortic valve is tricuspid. Aortic valve regurgitation is not  visualized. No aortic stenosis is present.   Comparison(s): EF 60-65%. Mild mitral  regurgitation.   Carotid doppler 01/2023:  IMPRESSION: 1. Atherosclerotic plaque involving bilateral carotid arteries. Estimated degree of stenosis in the internal carotid arteries is less than 50% bilaterally. 2. Patent vertebral  arteries with antegrade flow.  Echocardiogram 08/2021: 1. Left ventricular ejection fraction, by estimation, is 60 to 65%. The  left ventricle has normal function. The left ventricle has no regional  wall motion abnormalities. Left ventricular diastolic parameters are  indeterminate.   2. Right ventricular systolic function is normal. The right ventricular  size is normal. Tricuspid regurgitation signal is inadequate for assessing  PA pressure.   3. The mitral valve is grossly normal. Mild mitral valve regurgitation.   4. The aortic valve is tricuspid. Aortic valve regurgitation is not  visualized.   Comparison(s): Prior images reviewed side by side. LVEF remains normal  range.  Carotid Doppler 12/2020: IMPRESSION: Moderate amount of bilateral atherosclerotic plaque, progressed compared to the 2013 examination though not resulting in a hemodynamically significant stenosis within either internal carotid artery.  Cardiac monitor 2021: Sinus rhythm with rare PACs and PVCs and short bursts of SVT (possibly atrial tachycardia).  Left heart cath 04/2019: 40% eccentric mid LAD in-stent restenosis.  At completion of stent procedure in September 2020, there was residual stent deformity in this region.  There is no high-grade obstruction in the LAD stent.  The LAD is otherwise normal. Normal left main Normal circumflex Normal RCA Normal LV systolic function and end-diastolic pressure   RECOMMENDATIONS:   Dyspnea cannot be explained by findings on today's study based on absence of significant coronary obstruction and normal LV hemodynamics. Continue dual antiplatelet therapy beyond 12 months due to overlapping stents in the LAD.  Right and left heart cath 06/2018: Prox LAD lesion is 80% stenosed. Post intervention, there is a 0% residual stenosis. A drug-eluting stent was successfully placed using a STENT SYNERGY DES 3.5X20. A drug-eluting stent was successfully placed using a STENT  SYNERGY DES 3X12. The left ventricular systolic function is normal. LV end diastolic pressure is normal. The left ventricular ejection fraction is 55-65% by visual estimate.   1. Single vessel obstructive CAD involving the mid LAD immediately after a large diagonal. Abnormal FFR of 0.68. 2. Normal LV function 3. Low LV filling pressures 4. Normal right heart pressures 5. Normal cardiac output 6. Successful PCI of the proximal to mid LAD with DES x 2.    Plan: patient is a candidate for same day DC. Will switch Prilosec to Protonix. Will changed to high dose statin therapy.   Recommend uninterrupted dual antiplatelet therapy with Aspirin 81mg  daily and Clopidogrel 75mg  daily for a minimum of 6 months (stable ischemic heart disease - Class I recommendation).  Myoview 03/2018: Blood pressure demonstrated a hypertensive response to exercise. There was no ST segment deviation noted during stress. The study is normal. There are no perfusion defects. This is a low risk study. Duke treadmill score of 11 consistent with low risk for major cardiac events, excellent exercise function capacity (11 minutes, 11 METs) The left ventricular ejection fraction is hyperdynamic (>65%).  Review of Systems    All other systems reviewed and are otherwise negative except as noted above.  Physical Exam    VS:  BP 130/76   Pulse 75   Ht 5\' 4"  (1.626 m)   Wt 141 lb 9.6 oz (64.2 kg)   SpO2 100%   BMI 24.31 kg/m  , BMI Body mass index is 24.31 kg/m.  Wt Readings from Last 3 Encounters:  07/25/23 141 lb 9.6 oz (64.2 kg)  07/18/23 143 lb (64.9 kg)  06/25/23 140 lb (63.5 kg)    GEN: Well nourished, well developed, in no acute distress. HEENT: normal. Neck: Supple, no JVD, carotid bruits, or masses. Cardiac: S1/S2, RRR, no murmurs, rubs, or gallops. No clubbing, cyanosis.  No edema along BLE.  Radials/PT 2+ and equal bilaterally.  Respiratory:  Respirations regular and unlabored, clear to auscultation  bilaterally. 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 Skin: Warm and dry, no rash. Neuro:  Strength and sensation are intact. Psych: Normal affect.  Assessment & Plan   Elevated D-dimer Etiology multifactorial.  Patient has previously admitted to worsening shortness of breath, labwork revealed elevated D-dimer. Did discuss ruling out for PE and what test involves.  She is agreeable to undergo CT angio of chest, confirms she does not have allergy to contrast dye.  Will arrange testing for her to rule this out.  No other medication changes at this time.  CAD Hx of atypical chest pain, denies any recent symptoms. Past EKG was reassuring. Continue current medication regimen.  ED precautions discussed.  HTN Blood pressure stable today. Discussed to monitor BP at home at least 2 hours after medications and sitting for 5-10 minutes.  No medication changes at this time. Heart healthy diet and regular cardiovascular exercise encouraged.   5. Neck pain, carotid artery disease, HLD Continues to note neck pain. Carotid Doppler 01/2023 revealed < 50% stenosis along IC's.  LDL 37 from 11/2022.  Continue current medication regimen. Heart healthy diet and regular cardiovascular exercise encouraged.   5. Arthritis, Rheumatoid arthritis? Pt exhibits signs and symptoms of early stages of RA, believe multiple of her symptoms are due to this. Chart reviewed and PCP also has mentioned high suspicion of RA in previous notes. 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  Previously referred to Rheumatology for further workup and evaluation of RA.   6. Shortness of breath/DOE Low suspicion for ACS - believe possible RA is contributing, pt notes some improvement in symptoms.  Recent benign echocardiogram.  D-dimer came back elevated, arranging CT angio  chest to rule out PE-see above. Heart healthy diet encouraged. Care and ED precautions discussed.     Disposition: Follow up in 6-8 weeks with Dina Rich, MD or APP.  Signed, Sharlene Dory, NP

## 2023-07-25 NOTE — Patient Instructions (Addendum)
Medication Instructions:  Your physician recommends that you continue on your current medications as directed. Please refer to the Current Medication list given to you today.  Labwork: Today   Testing/Procedures: Non-Cardiac CT Angiography (CTA), is a special type of CT scan that uses a computer to produce multi-dimensional views of major blood vessels throughout the body. In CT angiography, a contrast material is injected through an IV to help visualize the blood vessels  Follow-Up: Your physician recommends that you schedule a follow-up appointment in: 6-8 weeks Tanya [name]   Any Other Special Instructions Will Be Listed Below (If Applicable).  If you need a refill on your cardiac medications before your next appointment, please call your pharmacy.

## 2023-07-25 NOTE — Telephone Encounter (Signed)
Checking percert on the following patient for testing scheduled at [facility]   CT ANGIO CHEST - 07/26/2023

## 2023-07-26 ENCOUNTER — Other Ambulatory Visit: Payer: Self-pay

## 2023-07-26 ENCOUNTER — Ambulatory Visit (HOSPITAL_COMMUNITY)
Admission: RE | Admit: 2023-07-26 | Discharge: 2023-07-26 | Disposition: A | Discharge status: Home / Self Care | Payer: Medicare Other | Source: Ambulatory Visit | Attending: Nurse Practitioner | Admitting: Nurse Practitioner

## 2023-07-26 DIAGNOSIS — I2699 Other pulmonary embolism without acute cor pulmonale: Secondary | ICD-10-CM | POA: Diagnosis not present

## 2023-07-26 DIAGNOSIS — I700 Atherosclerosis of aorta: Secondary | ICD-10-CM | POA: Insufficient documentation

## 2023-07-26 DIAGNOSIS — R0602 Shortness of breath: Secondary | ICD-10-CM | POA: Diagnosis not present

## 2023-07-26 DIAGNOSIS — K449 Diaphragmatic hernia without obstruction or gangrene: Secondary | ICD-10-CM | POA: Diagnosis not present

## 2023-07-26 DIAGNOSIS — R918 Other nonspecific abnormal finding of lung field: Secondary | ICD-10-CM | POA: Diagnosis not present

## 2023-07-26 DIAGNOSIS — I10 Essential (primary) hypertension: Secondary | ICD-10-CM

## 2023-07-26 DIAGNOSIS — J479 Bronchiectasis, uncomplicated: Secondary | ICD-10-CM | POA: Insufficient documentation

## 2023-07-26 DIAGNOSIS — I2510 Atherosclerotic heart disease of native coronary artery without angina pectoris: Secondary | ICD-10-CM | POA: Diagnosis not present

## 2023-07-26 LAB — POCT I-STAT CREATININE: Creatinine, Ser: 0.7 mg/dL (ref 0.44–1.00)

## 2023-07-26 MED ORDER — IOHEXOL 350 MG/ML SOLN
75.0000 mL | Freq: Once | INTRAVENOUS | Status: AC | PRN
  Administered 2023-07-26: 75 mL via INTRAVENOUS

## 2023-07-27 LAB — BMP8+EGFR
BUN/Creatinine Ratio: 17 (ref 12–28)
BUN: 12 mg/dL (ref 8–27)
CO2: 21 mmol/L (ref 20–29)
Calcium: 9.5 mg/dL (ref 8.7–10.3)
Chloride: 111 mmol/L — ABNORMAL HIGH (ref 96–106)
Creatinine, Ser: 0.71 mg/dL (ref 0.57–1.00)
Glucose: 110 mg/dL — ABNORMAL HIGH (ref 70–99)
Potassium: 4.9 mmol/L (ref 3.5–5.2)
Sodium: 144 mmol/L (ref 134–144)
eGFR: 90 mL/min/{1.73_m2} (ref >59)

## 2023-08-01 NOTE — Progress Notes (Addendum)
Office Visit Note  Patient: Tanya [name]             Date of Birth: Feb 27, 1951           MRN: [id number]             PCP: Kerri Perches, MD Referring: Kerri Perches, MD Visit Date: 08/15/2023 Occupation: @GUAROCC @  Subjective:  Pain in both shoulders  History of Present Illness: Tanya [name] is a 72 y.o. female with osteoarthritis.  She was accompanied by her husband Gerlene Burdock today.  She states she has been having increased pain and discomfort in her both shoulders for the last 1 year.  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  She also does a lot of yard work at home.  She does all the chores at home.  She has been having difficulty raising her arms.  She also has pain from her left shoulder radiating into her chest wall.  She continues to have pain and discomfort in her bilateral hands.  She has intermittent pain in her knees.    Activities of Daily Living:  Patient reports morning stiffness for 0 minutes.   Patient Reports nocturnal pain.  Difficulty dressing/grooming: Reports Difficulty climbing stairs: Denies Difficulty getting out of chair: Denies Difficulty using hands for taps, buttons, cutlery, and/or writing: Reports  Review of Systems  Constitutional:  Negative for fatigue.  HENT:  Negative for mouth sores and mouth dryness.   Eyes:  Negative for dryness.  Respiratory:  Positive for cough and shortness of breath. Negative for wheezing.   Cardiovascular:  Negative for chest pain and palpitations.  Gastrointestinal:  Negative for blood in stool, constipation and diarrhea.  Endocrine: Positive for increased urination.  Genitourinary:  Positive for involuntary urination.  Musculoskeletal:  Positive for joint pain, joint pain, joint swelling, myalgias, muscle weakness, muscle tenderness and myalgias. Negative for gait problem and morning stiffness.  Skin:  Positive for rash. Negative for color change, hair loss and sensitivity to sunlight.   Allergic/Immunologic: Negative for susceptible to infections.  Neurological:  Positive for dizziness and headaches.  Hematological:  Positive for bruising/bleeding tendency. Negative for swollen glands.  Psychiatric/Behavioral:  Positive for sleep disturbance. Negative for depressed mood. The patient is not nervous/anxious.     PMFS History:  Patient Active Problem List   Diagnosis Date Noted   Acute bronchitis due to Mycoplasma pneumoniae 08/07/2023   Bronchiectasis with acute lower respiratory infection (HCC) 08/07/2023   Encounter for immunization 07/22/2023   Encounter for Medicare annual examination with abnormal findings 11/14/2022   Vitamin D deficiency 11/14/2022   Fall as cause of accidental injury in home as place of occurrence 06/12/2022   Neck pain, bilateral 08/13/2021   Intestinal metaplasia of stomach 05/15/2021   Anxiety about health 04/13/2021   Low back pain with left-sided sciatica 03/07/2021   Hand pain 02/28/2021   H. pylori infection 01/26/2021   IBS (irritable bowel syndrome) 01/12/2021   Right trigeminal neuralgia 12/06/2020   History of cerebral aneurysm 10/03/2020   Brain aneurysm 12/21/2019   Aneurysm, carotid artery, internal 10/25/2019   Sensory disturbance 06/29/2019   CAD (coronary artery disease), native coronary artery 06/06/2018   Migraine without aura 03/08/2018   Primary osteoarthritis of both hands 02/13/2018   Primary osteoarthritis of both knees 02/13/2018   Anxiety 02/06/2017   Arthritis 07/20/2016   Tubular adenoma of colon 02/11/2016   Dyspnea on exertion 11/19/2014   Coronary artery calcification seen on CAT  scan - Moderate LAD lesion noted 11/16/2014   Abnormal CT scan, chest 11/08/2014   IGT (impaired glucose tolerance) 06/11/2014   Osteopenia 01/27/2014   Microscopic hematuria 01/05/2013   Urinary incontinence, mixed 01/05/2013   GERD (gastroesophageal reflux disease) 12/16/2009   Hyperlipemia 04/09/2008   Essential  hypertension 04/09/2008   Insomnia 04/09/2008    Past Medical History:  Diagnosis Date   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 s/p DESx2 to proximal LAD in 06/2018 b. cath in 04/2019 showing 40% mid-LAD ISR with no significant stenosis along LM, LCx, or RCA   Cystitis    Depression    Elevated liver enzymes    Fatty liver    GERD (gastroesophageal reflux disease)    Hard of hearing    Headache    migraine   Hyperlipidemia    Hypertension    Insomnia    Migraine    Osteoarthritis    Unspecified hypothyroidism    Vaginitis    Wears glasses    Wears hearing aid    B/L    Family History  Problem Relation Age of Onset   Dementia Mother    Heart disease Mother        heart attack    Hypertension Mother    Heart attack Father    Heart disease Father        heart attack    Hypertension Father    Past Surgical History:  Procedure Laterality Date   BIOPSY N/A 01/18/2021   Procedure: BIOPSY;  Surgeon: Dolores Frame, MD;  Location: [facility];  Service: Gastroenterology;  Laterality: N/A;   BREAST BIOPSY Right    unsure when/benign   CATARACT EXTRACTION W/PHACO Right 05/08/2019   Procedure: CATARACT EXTRACTION PHACO AND INTRAOCULAR LENS PLACEMENT (IOC);  Surgeon: Fabio Pierce, MD;  Location: [facility];  Service: Ophthalmology;  Laterality: Right;  CDE: 10.02   COLONOSCOPY N/A 02/08/2016   Procedure: COLONOSCOPY;  Surgeon: Malissa Hippo, MD;  Location: [facility];  Service: Endoscopy;  Laterality: N/A;  1200 - moved to 11:45 - Ann to notify   CORONARY PRESSURE/FFR STUDY N/A 06/06/2018   Procedure: INTRAVASCULAR PRESSURE WIRE/FFR STUDY;  Surgeon: Swaziland, [name], MD;  Location: [facility] INVASIVE CV LAB;  Service: Cardiovascular;  Laterality: N/A;   CORONARY STENT INTERVENTION N/A 06/06/2018   Procedure: CORONARY STENT INTERVENTION;  Surgeon: Swaziland, [name], MD;  Location: [facility] INVASIVE CV  LAB;  Service: Cardiovascular;  Laterality: N/A;   ESOPHAGOGASTRODUODENOSCOPY (EGD) WITH PROPOFOL N/A 01/18/2021   Procedure: ESOPHAGOGASTRODUODENOSCOPY (EGD) WITH PROPOFOL;  Surgeon: Dolores Frame, MD;  Location: [facility];  Service: Gastroenterology;  Laterality: N/A;  10:30AM   IR ANGIO INTRA EXTRACRAN SEL COM CAROTID INNOMINATE BILAT MOD SED  09/01/2019   IR ANGIO INTRA EXTRACRAN SEL INTERNAL CAROTID UNI L MOD SED  12/21/2019   IR ANGIO VERTEBRAL SEL VERTEBRAL BILAT MOD SED  09/01/2019   IR ANGIOGRAM FOLLOW UP STUDY  12/21/2019   IR TRANSCATH/EMBOLIZ  12/21/2019   LEFT HEART CATH AND CORONARY ANGIOGRAPHY N/A 04/24/2019   Procedure: LEFT HEART CATH AND CORONARY ANGIOGRAPHY;  Surgeon: Lyn Records, MD;  Location: [facility] INVASIVE CV LAB;  Service: Cardiovascular;  Laterality: N/A;   LEFT HEART CATHETERIZATION WITH CORONARY ANGIOGRAM N/A 11/19/2014   Procedure: LEFT HEART CATHETERIZATION WITH CORONARY ANGIOGRAM;  Surgeon: Marykay Lex,  MD;  Location: [facility] CATH LAB;  Service: Cardiovascular;  Laterality: N/A;   RADIOLOGY WITH ANESTHESIA N/A 11/23/2019   Procedure: Otho Bellows;  Surgeon: Julieanne Cotton, MD;  Location: [facility] OR;  Service: Radiology;  Laterality: N/A;   RADIOLOGY WITH ANESTHESIA N/A 12/21/2019   Procedure: EMBOLIZATION;  Surgeon: Julieanne Cotton, MD;  Location: [facility] OR;  Service: Radiology;  Laterality: N/A;   RIGHT/LEFT HEART CATH AND CORONARY ANGIOGRAPHY N/A 06/06/2018   Procedure: RIGHT/LEFT HEART CATH AND CORONARY ANGIOGRAPHY;  Surgeon: Swaziland, [name], MD;  Location: [facility] INVASIVE CV LAB;  Service: Cardiovascular;  Laterality: N/A;   Social History   Social History Narrative   [place] been in [place] 20 years  ,lives with husband   Caffeine coffee 1 c daily    Immunization History  Administered Date(s) Administered   Fluad Quad(high Dose 65+) 06/16/2019, 08/10/2021, 06/12/2022   Fluad Trivalent(High Dose 65+) 07/18/2023    Influenza Whole 07/05/2008   Influenza,inj,Quad PF,6+ Mos 07/17/2013, 08/01/2014, 05/24/2016, 05/19/2020   Influenza-Unspecified 06/10/2017   Moderna Sars-Covid-2 Vaccination 12/07/2019, 01/05/2020, 09/28/2020   Pfizer Covid-19 Vaccine Bivalent Booster 27yrs & up 08/04/2022   Pneumococcal Conjugate-13 11/08/2014   Pneumococcal Polysaccharide-23 09/06/2004, 03/30/2016   Td 01/21/2003   Tdap 07/17/2013   Zoster Recombinant(Shingrix) 03/25/2019, 04/06/2022   Zoster, Live 01/12/2011     Objective: Vital Signs: BP 125/74 (BP Location: Left Arm, Patient Position: Sitting, Cuff Size: Normal)   Pulse (!) 55   Resp 14   Ht 5\' 2"  (1.575 m)   Wt 142 lb 9.6 oz (64.7 kg)   BMI 26.08 kg/m    Physical Exam Vitals and nursing note reviewed.  Constitutional:      Appearance: She is well-developed.  HENT:     Head: Normocephalic and atraumatic.  Eyes:     Conjunctiva/sclera: Conjunctivae normal.  Cardiovascular:     Rate and Rhythm: Normal rate and regular rhythm.     Heart sounds: Normal heart sounds.  Pulmonary:     Effort: Pulmonary effort is normal.     Breath sounds: Normal breath sounds.  Abdominal:     General: Bowel sounds are normal.     Palpations: Abdomen is soft.  Musculoskeletal:     Cervical back: Normal range of motion.  Lymphadenopathy:     Cervical: No cervical adenopathy.  Skin:    General: Skin is warm and dry.     Capillary Refill: Capillary refill takes less than 2 seconds.  Neurological:     Mental Status: She is alert and oriented to person, place, and time.  Psychiatric:        Behavior: Behavior normal.      Musculoskeletal Exam: She had range motion of cervical, thoracic and lumbar spine.  Right shoulder joint was good range of motion with some discomfort.  Left shoulder joint had painful abduction up to 140 degrees.  She had painful internal rotation.  Elbow joints with good range of motion.  Wrist joints with good range of motion with no synovitis.  She  had bilateral PIP and DIP thickening.  Some inflammation was noted in the right third PIP and left fourth PIP joints.  Hip joints and knee joints with good range of motion without any warmth swelling or effusion.  There was no tenderness over ankles or MTPs.  CDAI Exam: CDAI Score: -- Patient Global: --; Provider Global: -- Swollen: --; Tender: -- Joint Exam 08/15/2023   No joint exam has been documented for this visit  There is currently no information documented on the homunculus. Go to the Rheumatology activity and complete the homunculus joint exam.  Investigation: No additional findings.  Imaging: CT Angio Chest Pulmonary Embolism (PE) W or WO Contrast  Result Date: 07/26/2023 CLINICAL DATA:  Shortness of breath with exertion EXAM: CT ANGIOGRAPHY CHEST WITH CONTRAST TECHNIQUE: Multidetector CT imaging of the chest was performed using the standard protocol during bolus administration of intravenous contrast. Multiplanar CT image reconstructions and MIPs were obtained to evaluate the vascular anatomy. RADIATION DOSE REDUCTION: This exam was performed according to the departmental dose-optimization program which includes automated exposure control, adjustment of the mA and/or kV according to patient size and/or use of iterative reconstruction technique. CONTRAST:  75mL OMNIPAQUE IOHEXOL 350 MG/ML SOLN COMPARISON:  Chest CT dated November 05, 2014 FINDINGS: Cardiovascular: No evidence of pulmonary embolus. Normal heart size. No pericardial effusion. Normal caliber thoracic aorta with moderate atherosclerotic disease. Severe coronary artery calcifications. Mediastinum/Nodes: Small hiatal hernia. Thyroid is unremarkable. No enlarged lymph nodes seen in the chest. Lungs/Pleura: Central airways are patent. Similar mild right middle lobe bronchiectasis with new mucous plugging and clustered small solid nodules. Reference new nodule measuring 3 mm on series 6, image 98. Stable subpleural solid nodule  of the right middle lobe measuring 4 mm on series 2, image 92. No pleural effusion or pneumothorax. Upper Abdomen: No acute abnormality. Musculoskeletal: No chest wall abnormality. No acute or significant osseous findings. Review of the MIP images confirms the above findings. IMPRESSION: 1. No evidence of pulmonary embolus. 2. Similar mild right middle lobe bronchiectasis with new mucous plugging and clustered small solid nodules, likely due to chronic atypical infection or aspiration. Largest new solid nodule measures 3 mm. No follow-up needed if patient is low-risk.This recommendation follows the consensus statement: Guidelines for Management of Incidental Pulmonary Nodules Detected on CT Images: From the Fleischner Society 2017; Radiology 2017; 284:228-243. 3. Aortic Atherosclerosis (ICD10-I70.0). Electronically Signed   By: Allegra Lai M.D.   On: 07/26/2023 15:15    Recent Labs: Lab Results  Component Value Date   WBC 4.8 07/04/2023   HGB 13.0 07/04/2023   PLT 306 07/04/2023   NA 144 07/26/2023   K 4.9 07/26/2023   CL 111 (H) 07/26/2023   CO2 21 07/26/2023   GLUCOSE 110 (H) 07/26/2023   BUN 12 07/26/2023   CREATININE 0.70 07/26/2023   BILITOT 0.2 11/16/2022   ALKPHOS 69 11/16/2022   AST 24 11/16/2022   ALT 23 11/16/2022   PROT 6.5 11/16/2022   ALBUMIN 4.2 11/16/2022   CALCIUM 9.5 07/26/2023   GFRAA >60 03/28/2020    Speciality Comments: No specialty comments available.  Procedures:  Large Joint Inj: L subacromial bursa on 08/15/2023 9:00 AM Indications: pain Details: 27 G 1.5 in needle, lateral approach  Arthrogram: No  Medications: 1 mL lidocaine 1 %; 40 mg triamcinolone acetonide 40 MG/ML Aspirate: 0 mL Outcome: tolerated well, no immediate complications Procedure, treatment alternatives, risks and benefits explained, specific risks discussed. Consent was given by the patient. Immediately prior to procedure a time out was called to verify the correct patient,  procedure, equipment, support staff and site/side marked as required. Patient was prepped and draped in the usual sterile fashion.     Allergies: Patient has no known allergies.   Assessment / Plan:     Visit Diagnoses: Chronic pain of both shoulders -patient states she has been having progressively increasing pain over the last 1 year.  She had limited abduction and internal  rotation of her left shoulder joint.  She had painful range of motion of bilateral shoulders.  No warmth swelling or effusion was noted.  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  She also does a lot of yard work at home.  Her husband states that she has been blowing leaves.  Plan: XR Shoulder Left, XR Shoulder Right.  X-rays of bilateral shoulder joints were unremarkable.  X-ray findings were discussed with the patient.  Patient requested left shoulder joint injection.  After informed consent was obtained and side effects were discussed left shoulder joint subacromial region was injected with lidocaine and Kenalog as described above.  Patient tolerated the procedure well.  Postprocedure instructions were given.  A handout on shoulder exercises was given.  Patient has been experiencing some discomfort in her right shoulder joint.  If her symptoms get worse she will contact us for a cortisone injection.  A work excuse use was given for today per patient's request.  Primary osteoarthritis of both knees-she has intermittent discomfort in her knee joints.  No warmth swelling or effusion was noted.  Primary osteoarthritis of both hands-she complaints of discomfort in the bilateral hands.  She had bilateral PIP and DIP thickening with some inflammatory changes.  Joint protection muscle strengthening was discussed.  Other medical problems are listed as follows:  Coronary artery calcification seen on CAT scan - Moderate LAD lesion noted  Essential hypertension-blood pressure was normal at 125/74.  Intractable migraine without  aura and without status migrainosus  History of gastroesophageal reflux (GERD)  History of IBS  Aneurysm, carotid artery, internal  Intestinal metaplasia of stomach  Tubular adenoma of colon  Right trigeminal neuralgia  Microscopic hematuria  Anxiety  H. pylori infection  History of cerebral aneurysm  Other hyperlipidemia  Vitamin D deficiency  Orders: Orders Placed This Encounter  Procedures   Large Joint Inj   XR Shoulder Left   XR Shoulder Right   No orders of the defined types were placed in this encounter.   Follow-Up Instructions: Return for Osteoarthritis.   Pollyann Savoy, MD  Note - This record has been created using Animal nutritionist.  Chart creation errors have been sought, but may not always  have been located. Such creation errors do not reflect on  the standard of medical care.

## 2023-08-07 ENCOUNTER — Encounter: Payer: Self-pay | Admitting: Internal Medicine

## 2023-08-07 ENCOUNTER — Ambulatory Visit (INDEPENDENT_AMBULATORY_CARE_PROVIDER_SITE_OTHER): Payer: Medicare Other | Admitting: Internal Medicine

## 2023-08-07 VITALS — BP 127/57 | HR 69 | Ht 64.0 in | Wt 142.0 lb

## 2023-08-07 DIAGNOSIS — J200 Acute bronchitis due to Mycoplasma pneumoniae: Secondary | ICD-10-CM | POA: Insufficient documentation

## 2023-08-07 DIAGNOSIS — J470 Bronchiectasis with acute lower respiratory infection: Secondary | ICD-10-CM | POA: Insufficient documentation

## 2023-08-07 MED ORDER — METHYLPREDNISOLONE 4 MG PO TBPK: ORAL_TABLET | ORAL | 0 refills | Status: DC

## 2023-08-07 MED ORDER — ALBUTEROL SULFATE HFA 108 (90 BASE) MCG/ACT IN AERS: 2.0000 | INHALATION_SPRAY | Freq: Four times a day (QID) | RESPIRATORY_TRACT | 0 refills | Status: AC | PRN

## 2023-08-07 MED ORDER — AZITHROMYCIN 250 MG PO TABS: ORAL_TABLET | ORAL | 0 refills | Status: DC

## 2023-08-07 MED ORDER — AZITHROMYCIN 250 MG PO TABS: ORAL_TABLET | ORAL | 0 refills | Status: AC

## 2023-08-07 NOTE — Patient Instructions (Addendum)
Please start taking Azithromycin - antibiotic as prescribed.  Please start taking Prednisone as prescribed.  Please use Albuterol inhaler as needed for shortness of breath or wheezing.  Please use Incentive spirometer for breathing exercises.

## 2023-08-07 NOTE — Progress Notes (Signed)
Acute Office Visit  Subjective:    Patient ID: Tanya [name], female    DOB: 1951-03-24, 72 y.o.   MRN: [id number]  Chief Complaint  Patient presents with   Bronchitis    Bronchitis per heart doctor    HPI Patient is in today for complaint of dry cough and exertional dyspnea for the last 2 months.  She was seen by cardiologist, had elevated D-dimer and later CT chest PE protocol, which showed right middle lobe bronchiectasis with mucous plugging, but no PE.  She has had intermittent chills.  Denies any nasal congestion, postnasal drip or sore throat currently.  Does not have smoking history.  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 s/p DESx2 to proximal LAD in 06/2018 b. cath in 04/2019 showing 40% mid-LAD ISR with no significant stenosis along LM, LCx, or RCA   Cystitis    Depression    Elevated liver enzymes    Fatty liver    GERD (gastroesophageal reflux disease)    Hard of hearing    Headache    migraine   Hyperlipidemia    Hypertension    Insomnia    Migraine    Osteoarthritis    Unspecified hypothyroidism    Vaginitis    Wears glasses    Wears hearing aid    B/L    Past Surgical History:  Procedure Laterality Date   BIOPSY N/A 01/18/2021   Procedure: BIOPSY;  Surgeon: Dolores Frame, MD;  Location: [facility];  Service: Gastroenterology;  Laterality: N/A;   BREAST BIOPSY Right    unsure when/benign   CATARACT EXTRACTION W/PHACO Right 05/08/2019   Procedure: CATARACT EXTRACTION PHACO AND INTRAOCULAR LENS PLACEMENT (IOC);  Surgeon: Fabio Pierce, MD;  Location: [facility];  Service: Ophthalmology;  Laterality: Right;  CDE: 10.02   COLONOSCOPY N/A 02/08/2016   Procedure: COLONOSCOPY;  Surgeon: Malissa Hippo, MD;  Location: [facility];  Service: Endoscopy;  Laterality: N/A;  1200 - moved to 11:45 - Ann to notify   CORONARY  PRESSURE/FFR STUDY N/A 06/06/2018   Procedure: INTRAVASCULAR PRESSURE WIRE/FFR STUDY;  Surgeon: Swaziland, [name], MD;  Location: [facility] INVASIVE CV LAB;  Service: Cardiovascular;  Laterality: N/A;   CORONARY STENT INTERVENTION N/A 06/06/2018   Procedure: CORONARY STENT INTERVENTION;  Surgeon: Swaziland, [name], MD;  Location: [facility] INVASIVE CV LAB;  Service: Cardiovascular;  Laterality: N/A;   ESOPHAGOGASTRODUODENOSCOPY (EGD) WITH PROPOFOL N/A 01/18/2021   Procedure: ESOPHAGOGASTRODUODENOSCOPY (EGD) WITH PROPOFOL;  Surgeon: Dolores Frame, MD;  Location: [facility];  Service: Gastroenterology;  Laterality: N/A;  10:30AM   IR ANGIO INTRA EXTRACRAN SEL COM CAROTID INNOMINATE BILAT MOD SED  09/01/2019   IR ANGIO INTRA EXTRACRAN SEL INTERNAL CAROTID UNI L MOD SED  12/21/2019   IR ANGIO VERTEBRAL SEL VERTEBRAL BILAT MOD SED  09/01/2019   IR ANGIOGRAM FOLLOW UP STUDY  12/21/2019   IR TRANSCATH/EMBOLIZ  12/21/2019   LEFT HEART CATH AND CORONARY ANGIOGRAPHY N/A 04/24/2019   Procedure: LEFT HEART CATH AND CORONARY ANGIOGRAPHY;  Surgeon: Lyn Records, MD;  Location: [facility] INVASIVE CV LAB;  Service: Cardiovascular;  Laterality: N/A;   LEFT HEART CATHETERIZATION WITH CORONARY ANGIOGRAM N/A 11/19/2014   Procedure: LEFT HEART CATHETERIZATION WITH CORONARY ANGIOGRAM;  Surgeon: Marykay Lex, MD;  Location: [facility] CATH LAB;  Service: Cardiovascular;  Laterality: N/A;   RADIOLOGY WITH ANESTHESIA N/A 11/23/2019   Procedure: Otho Bellows;  Surgeon: Julieanne Cotton, MD;  Location: [facility] OR;  Service: Radiology;  Laterality: N/A;   RADIOLOGY WITH ANESTHESIA N/A 12/21/2019   Procedure: EMBOLIZATION;  Surgeon: Julieanne Cotton, MD;  Location: [facility] OR;  Service: Radiology;  Laterality: N/A;   RIGHT/LEFT HEART CATH AND CORONARY ANGIOGRAPHY N/A 06/06/2018   Procedure: RIGHT/LEFT HEART CATH AND CORONARY ANGIOGRAPHY;  Surgeon: Swaziland, [name], MD;  Location: [facility] INVASIVE CV LAB;  Service: Cardiovascular;  Laterality: N/A;     Family History  Problem Relation Age of Onset   Dementia Mother    Heart disease Mother        heart attack    Hypertension Mother    Heart attack Father    Heart disease Father        heart attack    Hypertension Father     Social History   Socioeconomic History   Marital status: Married    Spouse name: Richard   Number of children: 0   Years of education: 12   Highest education level: Not on file  Occupational History   Not on file  Tobacco Use   Smoking status: Never    Passive exposure: Never   Smokeless tobacco: Never  Vaping Use   Vaping status: Never Used  Substance and Sexual Activity   Alcohol use: Never   Drug use: Never   Sexual activity: Not on file  Other Topics Concern   Not on file  Social History Narrative   [place] originally [place] been in [place] 20 years  ,lives with husband   Caffeine coffee 1 c daily    Social Determinants of Health   Financial Resource Strain: Low Risk  (04/09/2022)   Overall Financial Resource Strain (CARDIA)    Difficulty of Paying Living Expenses: Not hard at all  Food Insecurity: No Food Insecurity (04/09/2022)   Hunger Vital Sign    Worried About Running Out of Food in the Last Year: Never true    Ran Out of Food in the Last Year: Never true  Transportation Needs: No Transportation Needs (04/09/2022)   PRAPARE - Administrator, Civil Service (Medical): No    Lack of Transportation (Non-Medical): No  Physical Activity: Insufficiently Active (04/09/2022)   Exercise Vital Sign    Days of Exercise per Week: 3 days    Minutes of Exercise per Session: 40 min  Stress: No Stress Concern Present (04/09/2022)   [employer] of Occupational Health - Occupational Stress Questionnaire    Feeling of Stress : Not at all  Social Connections: Moderately Integrated (04/09/2022)   Social Connection and Isolation Panel [NHANES]    Frequency of Communication with Friends and Family: More than three times a  week    Frequency of Social Gatherings with Friends and Family: Twice a week    Attends Religious Services: Never    Database administrator or Organizations: Yes    Attends Banker Meetings: Never    Marital Status: Married  Catering manager Violence: Not At Risk (04/05/2021)   Humiliation, Afraid, Rape, and Kick questionnaire    Fear of Current or Ex-Partner: No    Emotionally Abused: No    Physically Abused: No    Sexually Abused: No    Outpatient Medications Prior to Visit  Medication Sig Dispense Refill   acetaminophen (TYLENOL) 500 MG tablet Take 1,000 mg by mouth every  6 (six) hours as needed for moderate pain.     amLODipine (NORVASC) 5 MG tablet Take 1 tablet (5 mg total) by mouth daily. 90 tablet 3   Ascorbic Acid (VITAMIN C) 1000 MG tablet Take 1,000 mg by mouth daily.     aspirin EC 81 MG tablet Take 81 mg by mouth daily. Swallow whole.     cholecalciferol (VITAMIN D3) 25 MCG (1000 UNIT) tablet Take 1,000 Units by mouth daily.     cloNIDine (CATAPRES) 0.3 MG tablet Take 1 tablet (0.3 mg total) by mouth at bedtime. 5 tablet 0   clopidogrel (PLAVIX) 75 MG tablet TAKE 1 TABLET BY MOUTH DAILY 100 tablet 2   Compression Bandages KIT 1 each by Does not apply route daily. 1 kit 0   metoprolol succinate (TOPROL-XL) 25 MG 24 hr tablet Take 0.5 tablets (12.5 mg total) by mouth daily. 45 tablet 3   nitroGLYCERIN (NITROSTAT) 0.4 MG SL tablet Place 1 tablet (0.4 mg total) under the tongue every 5 (five) minutes as needed. 25 tablet 3   omeprazole (PRILOSEC) 40 MG capsule TAKE 1 CAPSULE BY MOUTH DAILY 100 capsule 2   PARoxetine (PAXIL) 10 MG tablet TAKE 1 TABLET BY MOUTH ONCE  DAILY 90 tablet 3   Potassium Chloride ER 20 MEQ TBCR TAKE 1 TABLET BY MOUTH DAILY 100 tablet 2   prednisoLONE acetate (PRED FORTE) 1 % ophthalmic suspension      rosuvastatin (CRESTOR) 40 MG tablet TAKE 1 TABLET BY MOUTH DAILY 100 tablet 2   temazepam (RESTORIL) 15 MG capsule Take 1 capsule (15 mg total)  by mouth at bedtime as needed for sleep. 30 capsule 5   topiramate (TOPAMAX) 50 MG tablet TAKE 1 TABLET BY MOUTH AT  BEDTIME 9 tablet 2   UNABLE TO FIND Compression hose 15-41mmhg knee high Dx leg swelling 1 each 0   No facility-administered medications prior to visit.    No Known Allergies  Review of Systems  Constitutional:  Negative for chills and fever.  HENT:  Positive for hearing loss. Negative for congestion, sinus pressure, sinus pain and sore throat.   Eyes:  Negative for pain and discharge.  Respiratory:  Positive for cough and shortness of breath.   Cardiovascular:  Negative for chest pain and palpitations.  Gastrointestinal:  Negative for abdominal pain, diarrhea, nausea and vomiting.  Endocrine: Negative for polydipsia and polyuria.  Genitourinary:  Negative for dysuria and hematuria.  Musculoskeletal:  Negative for neck pain and neck stiffness.  Skin:  Negative for rash.  Neurological:  Negative for dizziness and weakness.  Psychiatric/Behavioral:  Negative for agitation and behavioral problems.        Objective:    Physical Exam Vitals reviewed.  Constitutional:      General: She is not in acute distress.    Appearance: She is not diaphoretic.  HENT:     Head: Normocephalic and atraumatic.     Nose: Nose normal.     Mouth/Throat:     Mouth: Mucous membranes are moist.  Eyes:     General: No scleral icterus.    Extraocular Movements: Extraocular movements intact.     Pupils: Pupils are equal, round, and reactive to light.  Cardiovascular:     Rate and Rhythm: Normal rate and regular rhythm.     Heart sounds: Normal heart sounds. No murmur heard. Pulmonary:     Breath sounds: Rales (Mild, right lower lung field) present. No wheezing.  Abdominal:     Palpations: Abdomen  is soft.     Tenderness: There is no abdominal tenderness.  Musculoskeletal:     Cervical back: Neck supple. No tenderness.     Right lower leg: No edema.     Left lower leg: No edema.   Skin:    General: Skin is warm.     Findings: No rash.  Neurological:     General: No focal deficit present.     Mental Status: She is alert and oriented to person, place, and time.  Psychiatric:        Mood and Affect: Mood normal.        Behavior: Behavior normal.     BP (!) 127/57 (BP Location: Right Arm, Patient Position: Sitting, Cuff Size: Normal)   Pulse 69   Ht 5\' 4"  (1.626 m)   Wt 142 lb (64.4 kg)   SpO2 96%   BMI 24.37 kg/m  Wt Readings from Last 3 Encounters:  08/07/23 142 lb (64.4 kg)  07/25/23 141 lb 9.6 oz (64.2 kg)  07/18/23 143 lb (64.9 kg)        Assessment & Plan:   Problem List Items Addressed This Visit       Respiratory   Acute bronchitis due to Mycoplasma pneumoniae - Primary    Started empiric azithromycin Medrol Dosepak Albuterol as needed for dyspnea or wheezing Reevaluate in 3 to 4 weeks       Relevant Medications   methylPREDNISolone (MEDROL DOSEPAK) 4 MG TBPK tablet   albuterol (VENTOLIN HFA) 108 (90 Base) MCG/ACT inhaler   azithromycin (ZITHROMAX) 250 MG tablet   Bronchiectasis with acute lower respiratory infection (HCC)    Recent CT chest PE protocol reviewed-showed bronchiectasis No smoking history Advised to use incentive spirometer If persistent dyspnea, will need pulmonology evaluation      Relevant Medications   azithromycin (ZITHROMAX) 250 MG tablet     Meds ordered this encounter  Medications   DISCONTD: azithromycin (ZITHROMAX) 250 MG tablet    Sig: Take 2 tablets on day 1, then 1 tablet daily on days 2 through 5    Dispense:  6 tablet    Refill:  0   methylPREDNISolone (MEDROL DOSEPAK) 4 MG TBPK tablet    Sig: Take as package instructions.    Dispense:  1 each    Refill:  0   albuterol (VENTOLIN HFA) 108 (90 Base) MCG/ACT inhaler    Sig: Inhale 2 puffs into the lungs every 6 (six) hours as needed for wheezing or shortness of breath.    Dispense:  8 g    Refill:  0    Okay to substitute to  generic/formulary Albuterol.   azithromycin (ZITHROMAX) 250 MG tablet    Sig: Take 2 tablets on day 1, then 1 tablet daily on days 2 through 5    Dispense:  6 tablet    Refill:  0     Jaycie Kregel Concha Se, MD

## 2023-08-07 NOTE — Assessment & Plan Note (Signed)
Recent CT chest PE protocol reviewed-showed bronchiectasis No smoking history Advised to use incentive spirometer If persistent dyspnea, will need pulmonology evaluation

## 2023-08-07 NOTE — Assessment & Plan Note (Signed)
Started empiric azithromycin Medrol Dosepak Albuterol as needed for dyspnea or wheezing Reevaluate in 3 to 4 weeks

## 2023-08-12 ENCOUNTER — Telehealth (INDEPENDENT_AMBULATORY_CARE_PROVIDER_SITE_OTHER): Payer: Self-pay | Admitting: *Deleted

## 2023-08-12 NOTE — Telephone Encounter (Signed)
Who is your primary care physician: Dr. Lodema Hong  Reasons for the colonoscopy: screening  Have you had a colonoscopy before?  ? 10 yrs or more  Do you have family history of colon cancer? no  Previous colonoscopy with polyps removed? no  Do you have a history colorectal cancer?   no  Are you diabetic? If yes, Type 1 or Type 2?    no  Do you have a prosthetic or mechanical heart valve? no  Do you have a pacemaker/defibrillator?   no  Have you had endocarditis/atrial fibrillation? no  Have you had joint replacement within the last 12 months?  no  Do you tend to be constipated or have to use laxatives? yes  Do you have any history of drugs or alchohol?  no  Do you use supplemental oxygen?  no  Have you had a stroke or heart attack within the last 6 months? no  Do you take weight loss medication?  no  For female patients: have you had a hysterectomy?  no                                     are you post menopausal?       no                                            do you still have your menstrual cycle? no      Do you take any blood-thinning medications such as: (aspirin, warfarin, Plavix, Aggrenox) aspirin 81mg , PLAVIX  If yes we need the name, milligram, dosage and who is prescribing doctor  Current Outpatient Medications on File Prior to Visit  Medication Sig Dispense Refill   acetaminophen (TYLENOL) 500 MG tablet Take 1,000 mg by mouth every 6 (six) hours as needed for moderate pain.     albuterol (VENTOLIN HFA) 108 (90 Base) MCG/ACT inhaler Inhale 2 puffs into the lungs every 6 (six) hours as needed for wheezing or shortness of breath. 8 g 0   amLODipine (NORVASC) 5 MG tablet Take 1 tablet (5 mg total) by mouth daily. 90 tablet 3   Ascorbic Acid (VITAMIN C) 1000 MG tablet Take 1,000 mg by mouth daily.     aspirin EC 81 MG tablet Take 81 mg by mouth daily. Swallow whole.     azithromycin (ZITHROMAX) 250 MG tablet Take 2 tablets on day 1, then 1 tablet daily on days 2  through 5 6 tablet 0   cholecalciferol (VITAMIN D3) 25 MCG (1000 UNIT) tablet Take 1,000 Units by mouth daily.     cloNIDine (CATAPRES) 0.3 MG tablet Take 1 tablet (0.3 mg total) by mouth at bedtime. 5 tablet 0   clopidogrel (PLAVIX) 75 MG tablet TAKE 1 TABLET BY MOUTH DAILY 100 tablet 2   Compression Bandages KIT 1 each by Does not apply route daily. 1 kit 0   methylPREDNISolone (MEDROL DOSEPAK) 4 MG TBPK tablet Take as package instructions. 1 each 0   metoprolol succinate (TOPROL-XL) 25 MG 24 hr tablet Take 0.5 tablets (12.5 mg total) by mouth daily. 45 tablet 3   nitroGLYCERIN (NITROSTAT) 0.4 MG SL tablet Place 1 tablet (0.4 mg total) under the tongue every 5 (five) minutes as needed. 25 tablet 3   omeprazole (PRILOSEC) 40 MG capsule TAKE 1  CAPSULE BY MOUTH DAILY 100 capsule 2   PARoxetine (PAXIL) 10 MG tablet TAKE 1 TABLET BY MOUTH ONCE  DAILY 90 tablet 3   Potassium Chloride ER 20 MEQ TBCR TAKE 1 TABLET BY MOUTH DAILY 100 tablet 2   prednisoLONE acetate (PRED FORTE) 1 % ophthalmic suspension      rosuvastatin (CRESTOR) 40 MG tablet TAKE 1 TABLET BY MOUTH DAILY 100 tablet 2   temazepam (RESTORIL) 15 MG capsule Take 1 capsule (15 mg total) by mouth at bedtime as needed for sleep. 30 capsule 5   topiramate (TOPAMAX) 50 MG tablet TAKE 1 TABLET BY MOUTH AT  BEDTIME 9 tablet 2   UNABLE TO FIND Compression hose 15-47mmhg knee high Dx leg swelling 1 each 0   No current facility-administered medications on file prior to visit.    No Known Allergies   Pharmacy: [facility]  Best number where you can be reached: [phone]

## 2023-08-12 NOTE — Telephone Encounter (Signed)
Room 1 Thanks

## 2023-08-12 NOTE — Telephone Encounter (Signed)
Please see thread. Thanks

## 2023-08-14 ENCOUNTER — Encounter: Payer: Self-pay | Admitting: *Deleted

## 2023-08-14 ENCOUNTER — Other Ambulatory Visit: Payer: Self-pay | Admitting: *Deleted

## 2023-08-14 ENCOUNTER — Telehealth: Payer: Self-pay | Admitting: *Deleted

## 2023-08-14 MED ORDER — PEG 3350-KCL-NA BICARB-NACL 420 G PO SOLR: 4000.0000 mL | Freq: Once | ORAL | 0 refills | Status: AC

## 2023-08-14 NOTE — Telephone Encounter (Signed)
  Request for patient to stop medication prior to procedure or is needing cleareance  08/14/23  Tanya [name] August 28, 1951  What type of surgery is being performed? colonoscopy  When is surgery scheduled? 09/27/23  What type of clearance is required (medical or pharmacy to hold medication or both? Medication   Are there any medications that need to be held prior to surgery and how long? Plavix x 5 days  Name of physician performing surgery?  [facility] Phone: [phone] Fax: [phone]  Anethesia type (none, local, MAC, general)? MAC

## 2023-08-14 NOTE — Telephone Encounter (Signed)
Thanks Tammy, please obtain clearance to hold Plavix for 5 days

## 2023-08-14 NOTE — Telephone Encounter (Signed)
Spoke to pt's husband. He says he will get her to call back to schedule

## 2023-08-14 NOTE — Telephone Encounter (Signed)
Pt has been scheduled for 09/27/23. She is currently on Plavix. Please advise. Thank you

## 2023-08-14 NOTE — Telephone Encounter (Signed)
Clearance sent 

## 2023-08-15 ENCOUNTER — Ambulatory Visit: Payer: Medicare Other | Attending: Rheumatology | Admitting: Rheumatology

## 2023-08-15 ENCOUNTER — Encounter: Payer: Self-pay | Admitting: Rheumatology

## 2023-08-15 ENCOUNTER — Ambulatory Visit: Payer: Medicare Other

## 2023-08-15 VITALS — BP 125/74 | HR 55 | Resp 14 | Ht 62.0 in | Wt 142.6 lb

## 2023-08-15 DIAGNOSIS — M25511 Pain in right shoulder: Secondary | ICD-10-CM | POA: Diagnosis not present

## 2023-08-15 DIAGNOSIS — I10 Essential (primary) hypertension: Secondary | ICD-10-CM | POA: Diagnosis not present

## 2023-08-15 DIAGNOSIS — G500 Trigeminal neuralgia: Secondary | ICD-10-CM | POA: Diagnosis not present

## 2023-08-15 DIAGNOSIS — I671 Cerebral aneurysm, nonruptured: Secondary | ICD-10-CM

## 2023-08-15 DIAGNOSIS — D126 Benign neoplasm of colon, unspecified: Secondary | ICD-10-CM | POA: Diagnosis not present

## 2023-08-15 DIAGNOSIS — Z8719 Personal history of other diseases of the digestive system: Secondary | ICD-10-CM

## 2023-08-15 DIAGNOSIS — I2510 Atherosclerotic heart disease of native coronary artery without angina pectoris: Secondary | ICD-10-CM | POA: Diagnosis not present

## 2023-08-15 DIAGNOSIS — M19041 Primary osteoarthritis, right hand: Secondary | ICD-10-CM | POA: Diagnosis not present

## 2023-08-15 DIAGNOSIS — G8929 Other chronic pain: Secondary | ICD-10-CM | POA: Diagnosis not present

## 2023-08-15 DIAGNOSIS — M1990 Unspecified osteoarthritis, unspecified site: Secondary | ICD-10-CM

## 2023-08-15 DIAGNOSIS — F419 Anxiety disorder, unspecified: Secondary | ICD-10-CM

## 2023-08-15 DIAGNOSIS — M25512 Pain in left shoulder: Secondary | ICD-10-CM

## 2023-08-15 DIAGNOSIS — G43019 Migraine without aura, intractable, without status migrainosus: Secondary | ICD-10-CM

## 2023-08-15 DIAGNOSIS — Z8679 Personal history of other diseases of the circulatory system: Secondary | ICD-10-CM

## 2023-08-15 DIAGNOSIS — E7849 Other hyperlipidemia: Secondary | ICD-10-CM

## 2023-08-15 DIAGNOSIS — K31A0 Gastric intestinal metaplasia, unspecified: Secondary | ICD-10-CM | POA: Diagnosis not present

## 2023-08-15 DIAGNOSIS — M170 Bilateral primary osteoarthritis of knee: Secondary | ICD-10-CM | POA: Diagnosis not present

## 2023-08-15 DIAGNOSIS — A048 Other specified bacterial intestinal infections: Secondary | ICD-10-CM

## 2023-08-15 DIAGNOSIS — R3129 Other microscopic hematuria: Secondary | ICD-10-CM | POA: Diagnosis not present

## 2023-08-15 DIAGNOSIS — M19042 Primary osteoarthritis, left hand: Secondary | ICD-10-CM

## 2023-08-15 DIAGNOSIS — E559 Vitamin D deficiency, unspecified: Secondary | ICD-10-CM

## 2023-08-15 MED ORDER — TRIAMCINOLONE ACETONIDE 40 MG/ML IJ SUSP
40.0000 mg | INTRAMUSCULAR | Status: AC | PRN
  Administered 2023-08-15: 40 mg via INTRA_ARTICULAR

## 2023-08-15 MED ORDER — LIDOCAINE HCL 1 % IJ SOLN
1.0000 mL | INTRAMUSCULAR | Status: AC | PRN
  Administered 2023-08-15: 1 mL

## 2023-08-15 NOTE — Telephone Encounter (Signed)
See clearance TE

## 2023-08-15 NOTE — Telephone Encounter (Signed)
Thanks Tammy, please provide this recommendation Thanks

## 2023-08-15 NOTE — Patient Instructions (Signed)

## 2023-08-15 NOTE — Telephone Encounter (Signed)
Pt has been mailed instructions with the recommendations of holding plavix x 5 days prior to procedure.

## 2023-08-27 ENCOUNTER — Telehealth: Payer: Self-pay

## 2023-08-27 NOTE — Telephone Encounter (Signed)
Spoke to the husband advised for him to go to pharmacy to check on medications filled.

## 2023-08-27 NOTE — Telephone Encounter (Signed)
Copied from CRM [id number]. Topic: Clinical - Medication Question >> Aug 26, 2023  8:54 AM Desma Mcgregor wrote: Reason for CRM: Pt's husband Richard calling in to get some assistance with the clopidogrel (PLAVIX) 75 MG tablet. The pt has a procedure in a month and supposed to stop taking this medication, but not sure what it looks like or what it comes in because the pills bottles are not labeled. Please f/u @ [phone]

## 2023-09-03 NOTE — Progress Notes (Deleted)
Office Visit Note  Patient: Tanya [name]             Date of Birth: May 23, 1951           MRN: [id number]             PCP: Kerri Perches, MD Referring: Kerri Perches, MD Visit Date: 09/17/2023 Occupation: @GUAROCC @  Subjective:  No chief complaint on file.   History of Present Illness: Tanya [name] is a 72 y.o. female ***     Activities of Daily Living:  Patient reports morning stiffness for *** {minute/hour:19697}.   Patient {ACTIONS;DENIES/REPORTS:21021675::"Denies"} nocturnal pain.  Difficulty dressing/grooming: {ACTIONS;DENIES/REPORTS:21021675::"Denies"} Difficulty climbing stairs: {ACTIONS;DENIES/REPORTS:21021675::"Denies"} Difficulty getting out of chair: {ACTIONS;DENIES/REPORTS:21021675::"Denies"} Difficulty using hands for taps, buttons, cutlery, and/or writing: {ACTIONS;DENIES/REPORTS:21021675::"Denies"}  No Rheumatology ROS completed.   PMFS History:  Patient Active Problem List   Diagnosis Date Noted   Acute bronchitis due to Mycoplasma pneumoniae 08/07/2023   Bronchiectasis with acute lower respiratory infection (HCC) 08/07/2023   Encounter for immunization 07/22/2023   Encounter for Medicare annual examination with abnormal findings 11/14/2022   Vitamin D deficiency 11/14/2022   Fall as cause of accidental injury in home as place of occurrence 06/12/2022   Neck pain, bilateral 08/13/2021   Intestinal metaplasia of stomach 05/15/2021   Anxiety about health 04/13/2021   Low back pain with left-sided sciatica 03/07/2021   Hand pain 02/28/2021   H. pylori infection 01/26/2021   IBS (irritable bowel syndrome) 01/12/2021   Right trigeminal neuralgia 12/06/2020   History of cerebral aneurysm 10/03/2020   Brain aneurysm 12/21/2019   Aneurysm, carotid artery, internal 10/25/2019   Sensory disturbance 06/29/2019   CAD (coronary artery disease), native coronary artery 06/06/2018   Migraine without aura 03/08/2018   Primary osteoarthritis of  both hands 02/13/2018   Primary osteoarthritis of both knees 02/13/2018   Anxiety 02/06/2017   Arthritis 07/20/2016   Tubular adenoma of colon 02/11/2016   Dyspnea on exertion 11/19/2014   Coronary artery calcification seen on CAT scan - Moderate LAD lesion noted 11/16/2014   Abnormal CT scan, chest 11/08/2014   IGT (impaired glucose tolerance) 06/11/2014   Osteopenia 01/27/2014   Microscopic hematuria 01/05/2013   Urinary incontinence, mixed 01/05/2013   GERD (gastroesophageal reflux disease) 12/16/2009   Hyperlipemia 04/09/2008   Essential hypertension 04/09/2008   Insomnia 04/09/2008    Past Medical History:  Diagnosis Date   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 s/p DESx2 to proximal LAD in 06/2018 b. cath in 04/2019 showing 40% mid-LAD ISR with no significant stenosis along LM, LCx, or RCA   Cystitis    Depression    Elevated liver enzymes    Fatty liver    GERD (gastroesophageal reflux disease)    Hard of hearing    Headache    migraine   Hyperlipidemia    Hypertension    Insomnia    Migraine    Osteoarthritis    Unspecified hypothyroidism    Vaginitis    Wears glasses    Wears hearing aid    B/L    Family History  Problem Relation Age of Onset   Dementia Mother    Heart disease Mother        heart attack    Hypertension Mother    Heart attack Father    Heart disease Father  heart attack    Hypertension Father    Past Surgical History:  Procedure Laterality Date   BIOPSY N/A 01/18/2021   Procedure: BIOPSY;  Surgeon: Dolores Frame, MD;  Location: [facility];  Service: Gastroenterology;  Laterality: N/A;   BREAST BIOPSY Right    unsure when/benign   CATARACT EXTRACTION W/PHACO Right 05/08/2019   Procedure: CATARACT EXTRACTION PHACO AND INTRAOCULAR LENS PLACEMENT (IOC);  Surgeon: Fabio Pierce, MD;  Location: [facility];  Service: Ophthalmology;  Laterality:  Right;  CDE: 10.02   COLONOSCOPY N/A 02/08/2016   Procedure: COLONOSCOPY;  Surgeon: Malissa Hippo, MD;  Location: [facility];  Service: Endoscopy;  Laterality: N/A;  1200 - moved to 11:45 - Ann to notify   CORONARY PRESSURE/FFR STUDY N/A 06/06/2018   Procedure: INTRAVASCULAR PRESSURE WIRE/FFR STUDY;  Surgeon: Swaziland, [name], MD;  Location: [facility] INVASIVE CV LAB;  Service: Cardiovascular;  Laterality: N/A;   CORONARY STENT INTERVENTION N/A 06/06/2018   Procedure: CORONARY STENT INTERVENTION;  Surgeon: Swaziland, [name], MD;  Location: [facility] INVASIVE CV LAB;  Service: Cardiovascular;  Laterality: N/A;   ESOPHAGOGASTRODUODENOSCOPY (EGD) WITH PROPOFOL N/A 01/18/2021   Procedure: ESOPHAGOGASTRODUODENOSCOPY (EGD) WITH PROPOFOL;  Surgeon: Dolores Frame, MD;  Location: [facility];  Service: Gastroenterology;  Laterality: N/A;  10:30AM   IR ANGIO INTRA EXTRACRAN SEL COM CAROTID INNOMINATE BILAT MOD SED  09/01/2019   IR ANGIO INTRA EXTRACRAN SEL INTERNAL CAROTID UNI L MOD SED  12/21/2019   IR ANGIO VERTEBRAL SEL VERTEBRAL BILAT MOD SED  09/01/2019   IR ANGIOGRAM FOLLOW UP STUDY  12/21/2019   IR TRANSCATH/EMBOLIZ  12/21/2019   LEFT HEART CATH AND CORONARY ANGIOGRAPHY N/A 04/24/2019   Procedure: LEFT HEART CATH AND CORONARY ANGIOGRAPHY;  Surgeon: Lyn Records, MD;  Location: [facility] INVASIVE CV LAB;  Service: Cardiovascular;  Laterality: N/A;   LEFT HEART CATHETERIZATION WITH CORONARY ANGIOGRAM N/A 11/19/2014   Procedure: LEFT HEART CATHETERIZATION WITH CORONARY ANGIOGRAM;  Surgeon: Marykay Lex, MD;  Location: [facility] CATH LAB;  Service: Cardiovascular;  Laterality: N/A;   RADIOLOGY WITH ANESTHESIA N/A 11/23/2019   Procedure: Otho Bellows;  Surgeon: Julieanne Cotton, MD;  Location: [facility] OR;  Service: Radiology;  Laterality: N/A;   RADIOLOGY WITH ANESTHESIA N/A 12/21/2019   Procedure: EMBOLIZATION;  Surgeon: Julieanne Cotton, MD;  Location: [facility] OR;  Service: Radiology;  Laterality: N/A;    RIGHT/LEFT HEART CATH AND CORONARY ANGIOGRAPHY N/A 06/06/2018   Procedure: RIGHT/LEFT HEART CATH AND CORONARY ANGIOGRAPHY;  Surgeon: Swaziland, [name], MD;  Location: [facility] INVASIVE CV LAB;  Service: Cardiovascular;  Laterality: N/A;   Social History   Social History Narrative   [place] been in [place] 20 years  ,lives with husband   Caffeine coffee 1 c daily    Immunization History  Administered Date(s) Administered   Fluad Quad(high Dose 65+) 06/16/2019, 08/10/2021, 06/12/2022   Fluad Trivalent(High Dose 65+) 07/18/2023   Influenza Whole 07/05/2008   Influenza,inj,Quad PF,6+ Mos 07/17/2013, 08/01/2014, 05/24/2016, 05/19/2020   Influenza-Unspecified 06/10/2017   Moderna Sars-Covid-2 Vaccination 12/07/2019, 01/05/2020, 09/28/2020   Pfizer Covid-19 Vaccine Bivalent Booster 37yrs & up 08/04/2022   Pneumococcal Conjugate-13 11/08/2014   Pneumococcal Polysaccharide-23 09/06/2004, 03/30/2016   Td 01/21/2003   Tdap 07/17/2013   Zoster Recombinant(Shingrix) 03/25/2019, 04/06/2022   Zoster, Live 01/12/2011     Objective: Vital Signs: There were no vitals taken for this visit.   Physical Exam   Musculoskeletal Exam: ***  CDAI Exam: CDAI Score: -- Patient Global: --;  Provider Global: -- Swollen: --; Tender: -- Joint Exam 09/17/2023   No joint exam has been documented for this visit   There is currently no information documented on the homunculus. Go to the Rheumatology activity and complete the homunculus joint exam.  Investigation: No additional findings.  Imaging: XR Shoulder Left  Result Date: 08/15/2023 No glenohumeral or acromioclavicular joint space narrowing was noted.  No chondrocalcinosis was noted. Impression: Unremarkable x-rays of the shoulder.  XR Shoulder Right  Result Date: 08/15/2023 No glenohumeral or acromioclavicular joint space narrowing was noted.  No chondrocalcinosis was noted. Impression: Unremarkable x-rays of the shoulder.    Recent Labs: Lab Results  Component Value Date   WBC 4.8 07/04/2023   HGB 13.0 07/04/2023   PLT 306 07/04/2023   NA 144 07/26/2023   K 4.9 07/26/2023   CL 111 (H) 07/26/2023   CO2 21 07/26/2023   GLUCOSE 110 (H) 07/26/2023   BUN 12 07/26/2023   CREATININE 0.70 07/26/2023   BILITOT 0.2 11/16/2022   ALKPHOS 69 11/16/2022   AST 24 11/16/2022   ALT 23 11/16/2022   PROT 6.5 11/16/2022   ALBUMIN 4.2 11/16/2022   CALCIUM 9.5 07/26/2023   GFRAA >60 03/28/2020    Speciality Comments: No specialty comments available.  Procedures:  No procedures performed Allergies: Patient has no known allergies.   Assessment / Plan:     Visit Diagnoses: No diagnosis found.  Orders: No orders of the defined types were placed in this encounter.  No orders of the defined types were placed in this encounter.   Face-to-face time spent with patient was *** minutes. Greater than 50% of time was spent in counseling and coordination of care.  Follow-Up Instructions: No follow-ups on file.   Pollyann Savoy, MD  Note - This record has been created using Animal nutritionist.  Chart creation errors have been sought, but may not always  have been located. Such creation errors do not reflect on  the standard of medical care.

## 2023-09-05 ENCOUNTER — Encounter: Payer: Self-pay | Admitting: Nurse Practitioner

## 2023-09-05 ENCOUNTER — Ambulatory Visit: Payer: Medicare Other | Attending: Nurse Practitioner | Admitting: Nurse Practitioner

## 2023-09-05 VITALS — BP 114/80 | HR 54 | Ht 64.0 in | Wt 141.2 lb

## 2023-09-05 DIAGNOSIS — I10 Essential (primary) hypertension: Secondary | ICD-10-CM | POA: Diagnosis not present

## 2023-09-05 DIAGNOSIS — R0609 Other forms of dyspnea: Secondary | ICD-10-CM | POA: Diagnosis not present

## 2023-09-05 DIAGNOSIS — I779 Disorder of arteries and arterioles, unspecified: Secondary | ICD-10-CM | POA: Diagnosis not present

## 2023-09-05 DIAGNOSIS — R1310 Dysphagia, unspecified: Secondary | ICD-10-CM | POA: Diagnosis not present

## 2023-09-05 DIAGNOSIS — E785 Hyperlipidemia, unspecified: Secondary | ICD-10-CM

## 2023-09-05 DIAGNOSIS — Z01810 Encounter for preprocedural cardiovascular examination: Secondary | ICD-10-CM | POA: Diagnosis not present

## 2023-09-05 DIAGNOSIS — R918 Other nonspecific abnormal finding of lung field: Secondary | ICD-10-CM | POA: Diagnosis not present

## 2023-09-05 DIAGNOSIS — I2510 Atherosclerotic heart disease of native coronary artery without angina pectoris: Secondary | ICD-10-CM | POA: Diagnosis not present

## 2023-09-05 NOTE — Patient Instructions (Signed)
Medication Instructions:  Your physician recommends that you continue on your current medications as directed. Please refer to the Current Medication list given to you today.   Labwork: None today  Testing/Procedures: None today  Follow-Up: 6 months Dr.Branch  Any Other Special Instructions Will Be Listed Below (If Applicable).  If you need a refill on your cardiac medications before your next appointment, please call your pharmacy.

## 2023-09-05 NOTE — Progress Notes (Addendum)
Office Visit    Patient Name: Tanya [name] Date of Encounter: 09/05/2023 PCP:  Kerri Perches, MD [facility]  Cardiologist:  Dina Rich, MD  Advanced Practice Provider:  No care team member to display Electrophysiologist:  None   Chief Complaint    Tanya [name] is a 72 y.o. female with a hx of CAD, hypertension, hyperlipidemia, DOE, leg edema, and joint pain/arthritis, who presents today for follow-up.   Previous cardiovascular history includes 2 drug-eluting stents placed to proximal LAD in 2019.  Cardiac catheterization in 2020 revealed 40% mid LAD ISR, no significant stenosis along left circumflex, RCA, left main.  Last seen by Dr. Dina Rich on June 08, 2022.  Was overall doing well at that time.  It was suggested to try lowering Norvasc to 5 mg daily to help improve left leg swelling, recommended that this medication could possibly be stopped in the future.  Last seen for follow-up on March 14, 2023.  Reported neck/shoulder pain and pain in digits, reported seeing a doctor for her arthritis.  Denied any chest pain, shortness of breath, palpitations, syncope, presyncope, orthopnea, PND, swelling or significant weight changes, acute bleeding, or claudication. Noted dizziness when standing up at times.  Was referred to rheumatology for workup for RA.  06/25/2023 - Today she presents for follow-up.  Chief concern is shortness of breath that has been going on for a long time, has been getting worse recently, typically noticed with exertion and noticed with lifting something or with exercising.  Ongoing for 1 year.  Says she almost went to the emergency department due to this.  Does admit to leg swelling, worse in the evening and better in the morning.  Feels as though she has a fever, denies any actual fever, admits to cold intolerance.  Admits to chest heaviness, but points along epigastric area and says this wraps around her back.  Says she  believes this is radiating from her shoulder pain.  Neck pain appears the same. Denies any palpitations, syncope, presyncope, dizziness, orthopnea, PND, significant weight changes, acute bleeding, or claudication.  07/25/2023 -today she presents for follow-up.  Says her breathing seems to be doing a little bit better.  Patient is a poor historian due to language barrier.  Continues to note generalized musculoskeletal soreness. Denies any chest pain, palpitations, syncope, presyncope, dizziness, orthopnea, PND, swelling or significant weight changes, acute bleeding, or claudication.  09/05/2023 - today she presents for follow-up.  Doing better since I last saw her in the office.  She says her joint pain has improved and leg swelling has gone. Denies any chest pain, shortness of breath, palpitations, syncope, presyncope, dizziness, orthopnea, PND, swelling or significant weight changes, acute bleeding, or claudication.  Scheduled for upcoming colonoscopy later this month, September 27, 2023.  Does endorse some difficulty swallowing at times.  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   EKGs/Labs/Other Studies Reviewed:   The following studies were reviewed today:   EKG:  EKG is not ordered today.    Echo 07/2023: 1. Left ventricular ejection fraction, by estimation, is 65 to 70%. The  left ventricle has normal function. The left ventricle has no regional  wall motion abnormalities. There is mild left ventricular hypertrophy.  Left ventricular diastolic parameters  are consistent with Grade I diastolic dysfunction (impaired relaxation).  The average left ventricular global longitudinal strain is -23.3 %. The  global longitudinal strain is normal.  2. Right ventricular systolic function is normal. The right ventricular  size is normal. Tricuspid regurgitation signal is inadequate for assessing  PA pressure.   3. The mitral valve is normal in structure.  Mild mitral valve  regurgitation. No evidence of mitral stenosis.   4. The aortic valve is tricuspid. Aortic valve regurgitation is not  visualized. No aortic stenosis is present.   Comparison(s): EF 60-65%. Mild mitral regurgitation.   Carotid doppler 01/2023:  IMPRESSION: 1. Atherosclerotic plaque involving bilateral carotid arteries. Estimated degree of stenosis in the internal carotid arteries is less than 50% bilaterally. 2. Patent vertebral arteries with antegrade flow.  Echocardiogram 08/2021: 1. Left ventricular ejection fraction, by estimation, is 60 to 65%. The  left ventricle has normal function. The left ventricle has no regional  wall motion abnormalities. Left ventricular diastolic parameters are  indeterminate.   2. Right ventricular systolic function is normal. The right ventricular  size is normal. Tricuspid regurgitation signal is inadequate for assessing  PA pressure.   3. The mitral valve is grossly normal. Mild mitral valve regurgitation.   4. The aortic valve is tricuspid. Aortic valve regurgitation is not  visualized.   Comparison(s): Prior images reviewed side by side. LVEF remains normal  range.  Carotid Doppler 12/2020: IMPRESSION: Moderate amount of bilateral atherosclerotic plaque, progressed compared to the 2013 examination though not resulting in a hemodynamically significant stenosis within either internal carotid artery.  Cardiac monitor 2021: Sinus rhythm with rare PACs and PVCs and short bursts of SVT (possibly atrial tachycardia).  Left heart cath 04/2019: 40% eccentric mid LAD in-stent restenosis.  At completion of stent procedure in September 2020, there was residual stent deformity in this region.  There is no high-grade obstruction in the LAD stent.  The LAD is otherwise normal. Normal left main Normal circumflex Normal RCA Normal LV systolic function and end-diastolic pressure   RECOMMENDATIONS:   Dyspnea cannot be explained by  findings on today's study based on absence of significant coronary obstruction and normal LV hemodynamics. Continue dual antiplatelet therapy beyond 12 months due to overlapping stents in the LAD.  Right and left heart cath 06/2018: Prox LAD lesion is 80% stenosed. Post intervention, there is a 0% residual stenosis. A drug-eluting stent was successfully placed using a STENT SYNERGY DES 3.5X20. A drug-eluting stent was successfully placed using a STENT SYNERGY DES 3X12. The left ventricular systolic function is normal. LV end diastolic pressure is normal. The left ventricular ejection fraction is 55-65% by visual estimate.   1. Single vessel obstructive CAD involving the mid LAD immediately after a large diagonal. Abnormal FFR of 0.68. 2. Normal LV function 3. Low LV filling pressures 4. Normal right heart pressures 5. Normal cardiac output 6. Successful PCI of the proximal to mid LAD with DES x 2.    Plan: patient is a candidate for same day DC. Will switch Prilosec to Protonix. Will changed to high dose statin therapy.   Recommend uninterrupted dual antiplatelet therapy with Aspirin 81mg  daily and Clopidogrel 75mg  daily for a minimum of 6 months (stable ischemic heart disease - Class I recommendation).  Myoview 03/2018: Blood pressure demonstrated a hypertensive response to exercise. There was no ST segment deviation noted during stress. The study is normal. There are no perfusion defects. This is a low risk study. Duke treadmill score of 11 consistent with low risk for major cardiac events, excellent exercise function capacity (11 minutes, 11 METs) The left ventricular ejection fraction is hyperdynamic (>65%).  Review of  Systems    All other systems reviewed and are otherwise negative except as noted above.  Physical Exam    VS:  BP 114/80   Pulse (!) 54   Ht 5\' 4"  (1.626 m)   Wt 141 lb 3.2 oz (64 kg)   SpO2 99%   BMI 24.24 kg/m  , BMI Body mass index is 24.24 kg/m.  Wt  Readings from Last 3 Encounters:  09/05/23 141 lb 3.2 oz (64 kg)  08/15/23 142 lb 9.6 oz (64.7 kg)  08/07/23 142 lb (64.4 kg)    GEN: Well nourished, well developed, in no acute distress. HEENT: normal. Neck: Supple, no JVD, carotid bruits, or masses. Cardiac: S1/S2, RRR, no murmurs, rubs, or gallops. No clubbing, cyanosis.  No edema along BLE.  Radials/PT 2+ and equal bilaterally.  Respiratory:  Respirations regular and unlabored, clear to auscultation bilaterally. 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 Skin: Warm and dry, no rash. Neuro:  Strength and sensation are intact. Psych: Normal affect.  Assessment & Plan   CAD Hx of atypical chest pain, denies any recent symptoms. CT angio of chest 07/2023 was negative for PE. Past EKG was reassuring. Continue current medication regimen.  ED precautions discussed.  HTN Blood pressure stable today. Discussed to monitor BP at home at least 2 hours after medications and sitting for 5-10 minutes.  No medication changes at this time. Heart healthy diet and regular cardiovascular exercise encouraged.   5. Carotid artery disease, HLD Carotid Doppler 01/2023 revealed < 50% stenosis along IC's.  LDL 37 from 11/2022.  Continue current medication regimen. Heart healthy diet and regular cardiovascular exercise encouraged.   6. DOE, pulmonary nodules, difficulty swallowing CT chest 07/2023 revealed mild right middle lobe bronchiectasis with new mucous plugging and clustered small solid nodules, likely due to chronic atypical infection/aspiration.  Largest new solid nodule measured 3 mm.  She does endorse some symptoms of difficulty swallowing, recommended to further discuss this with PCP, may benefit from speech therapy evaluation. Heart healthy diet encouraged. Care and ED precautions discussed.  7.  Preoperative cardiovascular risk assessment Ms.  Longman's perioperative risk of a major cardiac event is 0.4% according to the Revised Cardiac Risk Index (RCRI).  Therefore, she is at low risk for perioperative complications.   Her functional capacity is good at > 4 METs according to the Duke Activity Status Index (DASI). Recommendations: According to ACC/AHA guidelines, no further cardiovascular testing needed.  The patient may proceed to surgery at acceptable risk.   Antiplatelet and/or Anticoagulation Recommendations: The patient should remain on Aspirin without interruption.    Addendum: Consulted Dr. Dina Rich via Epic and stated, "Ok to hold as long as no recent interventions within the last 6 months." May hold Plavix for 5 days prior to procedure and resume when felt safe to do so. Will route note to requesting party.   Disposition: Follow up in 6 months with Dina Rich, MD or APP.  Signed, Sharlene Dory, NP

## 2023-09-11 DIAGNOSIS — H2512 Age-related nuclear cataract, left eye: Secondary | ICD-10-CM | POA: Diagnosis not present

## 2023-09-11 DIAGNOSIS — Z961 Presence of intraocular lens: Secondary | ICD-10-CM | POA: Diagnosis not present

## 2023-09-11 DIAGNOSIS — L03213 Periorbital cellulitis: Secondary | ICD-10-CM | POA: Diagnosis not present

## 2023-09-13 ENCOUNTER — Ambulatory Visit (INDEPENDENT_AMBULATORY_CARE_PROVIDER_SITE_OTHER): Payer: Medicare Other | Admitting: Family Medicine

## 2023-09-13 ENCOUNTER — Encounter: Payer: Self-pay | Admitting: Family Medicine

## 2023-09-13 VITALS — BP 119/73 | HR 57 | Ht 64.0 in | Wt 140.1 lb

## 2023-09-13 DIAGNOSIS — R9389 Abnormal findings on diagnostic imaging of other specified body structures: Secondary | ICD-10-CM | POA: Diagnosis not present

## 2023-09-13 DIAGNOSIS — R053 Chronic cough: Secondary | ICD-10-CM | POA: Insufficient documentation

## 2023-09-13 DIAGNOSIS — I10 Essential (primary) hypertension: Secondary | ICD-10-CM | POA: Diagnosis not present

## 2023-09-13 DIAGNOSIS — Z23 Encounter for immunization: Secondary | ICD-10-CM

## 2023-09-13 MED ORDER — UNABLE TO FIND: 0 refills | Status: DC

## 2023-09-13 NOTE — Patient Instructions (Signed)
Keep February appointment as before.,  Call if you need me sooner.  You are referred to lung specialist Dr. Sherene Sires for evaluation of your chronic cough is an abnormal chest scan, please get appointment scheduled at the time of checkout if possible.  Please get fasting blood work ordered next week Monday as we discussed.  We will reach out for appointment date and time information for your colonoscopy referral as you have not yet heard from the office.Needs to fill out paperwork mailed from GI  You do need the Tdap vaccine we will give you a prescription to take to your pharmacy.  Best wishes for 2025.  Thanks for choosing [facility], we consider it a privelige to serve you.

## 2023-09-13 NOTE — Progress Notes (Unsigned)
   Tanya [name]     MRN: [id number]      DOB: 06/11/1951  Chief Complaint  Patient presents with   Follow-up    Follow up    HPI Tanya [name] is here for follow up and re-evaluation of chronic medical conditions, medication management and review of any available recent lab and radiology data.  Cough is unchanged still has chronic dry cough with an abnormal chest scan, will refer to pulmonary pulmonary.  Needs appointment scheduled for colonoscopy referral has been sent 2 months ago.  Needs tetanus shot prescription will be given for her to take to her pharmacy.  Will obtain fasting labs next week since she has eaten this morning.   ROS Denies recent fever or chills. Denies sinus pressure, nasal congestion, ear pain or sore throat. Denies chest congestion, productive cough or wheezing. Denies chest pains, palpitations and leg swelling Denies abdominal pain, nausea, vomiting,diarrhea or constipation.   Denies dysuria, frequency, hesitancy or incontinence. Denies joint pain, swelling and limitation in mobility. Denies headaches, seizures, numbness, or tingling. Denies depression, anxiety or insomnia. Denies skin break down or rash.   PE  BP 119/73 (BP Location: Right Arm, Patient Position: Sitting, Cuff Size: Large)   Pulse (!) 57   Ht 5\' 4"  (1.626 m)   Wt 140 lb 1.3 oz (63.5 kg)   SpO2 97%   BMI 24.04 kg/m   Patient alert and oriented and in no cardiopulmonary distress.  HEENT: No facial asymmetry, EOMI,     Neck supple .  Chest: Clear to auscultation bilaterally.  CVS: S1, S2 no murmurs, no S3.Regular rate.  ABD: Soft non tender.   Ext: No edema  MS: Adequate ROM spine, shoulders, hips and knees.  Skin: Intact, no ulcerations or rash noted.  Psych: Good eye contact, normal affect. Memory intact not anxious or depressed appearing.  CNS: CN 2-12 intact, power,  normal throughout.no focal deficits noted.   Assessment & Plan  No problem-specific  Assessment & Plan notes found for this encounter.

## 2023-09-13 NOTE — Assessment & Plan Note (Signed)
Chronic dry cough for over 1 month with abnormal chest scan, will refer to pulmonary.

## 2023-09-13 NOTE — Assessment & Plan Note (Signed)
4 week history, unchanged despite treatment and abn CXR , refer pulmonary

## 2023-09-15 NOTE — Assessment & Plan Note (Signed)
Controlled, no change in medication DASH diet and commitment to daily physical activity for a minimum of 30 minutes discussed and encouraged, as a part of hypertension management. The importance of attaining a healthy weight is also discussed.     09/13/2023    9:43 AM 09/05/2023   10:31 AM 08/15/2023    8:30 AM 08/07/2023   10:55 AM 07/25/2023    9:58 AM 07/18/2023    8:46 AM 06/25/2023    9:42 AM  BP/Weight  Systolic BP 119 114 125 127 130 117 128  Diastolic BP 73 80 74 57 76 70 68  Wt. (Lbs) 140.08 141.2 142.6 142 141.6 143 140  BMI 24.04 kg/m2 24.24 kg/m2 26.08 kg/m2 24.37 kg/m2 24.31 kg/m2 24.55 kg/m2 24.03 kg/m2

## 2023-09-16 DIAGNOSIS — E7849 Other hyperlipidemia: Secondary | ICD-10-CM | POA: Diagnosis not present

## 2023-09-17 ENCOUNTER — Ambulatory Visit: Payer: Medicare Other | Admitting: Rheumatology

## 2023-09-17 DIAGNOSIS — I671 Cerebral aneurysm, nonruptured: Secondary | ICD-10-CM

## 2023-09-17 DIAGNOSIS — D126 Benign neoplasm of colon, unspecified: Secondary | ICD-10-CM

## 2023-09-17 DIAGNOSIS — R3129 Other microscopic hematuria: Secondary | ICD-10-CM

## 2023-09-17 DIAGNOSIS — I10 Essential (primary) hypertension: Secondary | ICD-10-CM

## 2023-09-17 DIAGNOSIS — G8929 Other chronic pain: Secondary | ICD-10-CM

## 2023-09-17 DIAGNOSIS — G500 Trigeminal neuralgia: Secondary | ICD-10-CM

## 2023-09-17 DIAGNOSIS — G43019 Migraine without aura, intractable, without status migrainosus: Secondary | ICD-10-CM

## 2023-09-17 DIAGNOSIS — E559 Vitamin D deficiency, unspecified: Secondary | ICD-10-CM

## 2023-09-17 DIAGNOSIS — F419 Anxiety disorder, unspecified: Secondary | ICD-10-CM

## 2023-09-17 DIAGNOSIS — I2510 Atherosclerotic heart disease of native coronary artery without angina pectoris: Secondary | ICD-10-CM

## 2023-09-17 DIAGNOSIS — M170 Bilateral primary osteoarthritis of knee: Secondary | ICD-10-CM

## 2023-09-17 DIAGNOSIS — E7849 Other hyperlipidemia: Secondary | ICD-10-CM

## 2023-09-17 DIAGNOSIS — K31A0 Gastric intestinal metaplasia, unspecified: Secondary | ICD-10-CM

## 2023-09-17 DIAGNOSIS — A048 Other specified bacterial intestinal infections: Secondary | ICD-10-CM

## 2023-09-17 DIAGNOSIS — M19041 Primary osteoarthritis, right hand: Secondary | ICD-10-CM

## 2023-09-17 DIAGNOSIS — Z8719 Personal history of other diseases of the digestive system: Secondary | ICD-10-CM

## 2023-09-17 LAB — LIPID PANEL
Chol/HDL Ratio: 1.9 {ratio} (ref 0.0–4.4)
Cholesterol, Total: 126 mg/dL (ref 100–199)
HDL: 68 mg/dL (ref >39)
LDL Chol Calc (NIH): 46 mg/dL (ref 0–99)
Triglycerides: 55 mg/dL (ref 0–149)
VLDL Cholesterol Cal: 12 mg/dL (ref 5–40)

## 2023-09-17 LAB — CMP14+EGFR
ALT: 21 [IU]/L (ref 0–32)
AST: 30 [IU]/L (ref 0–40)
Albumin: 4.2 g/dL (ref 3.8–4.8)
Alkaline Phosphatase: 69 [IU]/L (ref 44–121)
BUN/Creatinine Ratio: 11 — ABNORMAL LOW (ref 12–28)
BUN: 8 mg/dL (ref 8–27)
Bilirubin Total: <0.2 mg/dL (ref 0.0–1.2)
CO2: 18 mmol/L — ABNORMAL LOW (ref 20–29)
Calcium: 9.2 mg/dL (ref 8.7–10.3)
Chloride: 110 mmol/L — ABNORMAL HIGH (ref 96–106)
Creatinine, Ser: 0.73 mg/dL (ref 0.57–1.00)
Globulin, Total: 2.3 g/dL (ref 1.5–4.5)
Glucose: 104 mg/dL — ABNORMAL HIGH (ref 70–99)
Potassium: 4.3 mmol/L (ref 3.5–5.2)
Sodium: 142 mmol/L (ref 134–144)
Total Protein: 6.5 g/dL (ref 6.0–8.5)
eGFR: 87 mL/min/{1.73_m2} (ref >59)

## 2023-09-18 ENCOUNTER — Encounter: Payer: Self-pay | Admitting: *Deleted

## 2023-09-18 NOTE — Telephone Encounter (Signed)
Pt has been rescheduled for 10/29/23. Updated instructions mailed to pt   Message Received: Today Tanya [name], Torrie Mayers, Diego Ulbricht L, LPN This patient's husband LM that she wants to reschedule.  I will put it in the depot and hold until the end of the day.  Thanks,

## 2023-09-23 ENCOUNTER — Other Ambulatory Visit (HOSPITAL_COMMUNITY): Payer: Medicare Other

## 2023-09-30 NOTE — Progress Notes (Signed)
Pt has been scheduled for 10/29/23. Instructions mailed and prep sent to the pharmacy

## 2023-10-16 ENCOUNTER — Other Ambulatory Visit: Payer: Self-pay | Admitting: Family Medicine

## 2023-10-16 DIAGNOSIS — R12 Heartburn: Secondary | ICD-10-CM

## 2023-10-25 ENCOUNTER — Encounter (HOSPITAL_COMMUNITY)
Admission: RE | Admit: 2023-10-25 | Discharge: 2023-10-25 | Disposition: A | Discharge status: Home / Self Care | Payer: Medicare Other | Source: Ambulatory Visit | Attending: Gastroenterology

## 2023-10-25 ENCOUNTER — Encounter (HOSPITAL_COMMUNITY): Payer: Self-pay

## 2023-10-25 ENCOUNTER — Other Ambulatory Visit: Payer: Self-pay

## 2023-10-29 ENCOUNTER — Encounter (HOSPITAL_COMMUNITY)
Admission: RE | Disposition: A | Discharge status: Home / Self Care | Payer: Self-pay | Source: Ambulatory Visit | Attending: Gastroenterology

## 2023-10-29 ENCOUNTER — Ambulatory Visit (HOSPITAL_COMMUNITY)
Admission: RE | Admit: 2023-10-29 | Discharge: 2023-10-29 | Disposition: A | Discharge status: Home / Self Care | Payer: Medicare Other | Source: Ambulatory Visit | Attending: Gastroenterology | Admitting: Gastroenterology

## 2023-10-29 ENCOUNTER — Ambulatory Visit (HOSPITAL_BASED_OUTPATIENT_CLINIC_OR_DEPARTMENT_OTHER): Payer: Medicare Other | Admitting: Certified Registered"

## 2023-10-29 ENCOUNTER — Ambulatory Visit (HOSPITAL_COMMUNITY): Payer: Medicare Other | Admitting: Certified Registered"

## 2023-10-29 ENCOUNTER — Other Ambulatory Visit: Payer: Self-pay

## 2023-10-29 ENCOUNTER — Telehealth (INDEPENDENT_AMBULATORY_CARE_PROVIDER_SITE_OTHER): Payer: Self-pay | Admitting: Gastroenterology

## 2023-10-29 ENCOUNTER — Encounter (HOSPITAL_COMMUNITY): Payer: Self-pay | Admitting: Gastroenterology

## 2023-10-29 DIAGNOSIS — K219 Gastro-esophageal reflux disease without esophagitis: Secondary | ICD-10-CM | POA: Diagnosis not present

## 2023-10-29 DIAGNOSIS — Z8249 Family history of ischemic heart disease and other diseases of the circulatory system: Secondary | ICD-10-CM | POA: Diagnosis not present

## 2023-10-29 DIAGNOSIS — Z955 Presence of coronary angioplasty implant and graft: Secondary | ICD-10-CM | POA: Insufficient documentation

## 2023-10-29 DIAGNOSIS — K648 Other hemorrhoids: Secondary | ICD-10-CM | POA: Insufficient documentation

## 2023-10-29 DIAGNOSIS — Z860100 Personal history of colon polyps, unspecified: Secondary | ICD-10-CM

## 2023-10-29 DIAGNOSIS — Z8679 Personal history of other diseases of the circulatory system: Secondary | ICD-10-CM | POA: Diagnosis not present

## 2023-10-29 DIAGNOSIS — I10 Essential (primary) hypertension: Secondary | ICD-10-CM | POA: Insufficient documentation

## 2023-10-29 DIAGNOSIS — I2510 Atherosclerotic heart disease of native coronary artery without angina pectoris: Secondary | ICD-10-CM | POA: Insufficient documentation

## 2023-10-29 DIAGNOSIS — Z1211 Encounter for screening for malignant neoplasm of colon: Secondary | ICD-10-CM | POA: Diagnosis not present

## 2023-10-29 DIAGNOSIS — K5730 Diverticulosis of large intestine without perforation or abscess without bleeding: Secondary | ICD-10-CM

## 2023-10-29 DIAGNOSIS — K635 Polyp of colon: Secondary | ICD-10-CM | POA: Diagnosis not present

## 2023-10-29 DIAGNOSIS — Z8719 Personal history of other diseases of the digestive system: Secondary | ICD-10-CM | POA: Diagnosis not present

## 2023-10-29 DIAGNOSIS — D122 Benign neoplasm of ascending colon: Secondary | ICD-10-CM | POA: Diagnosis not present

## 2023-10-29 DIAGNOSIS — E039 Hypothyroidism, unspecified: Secondary | ICD-10-CM | POA: Diagnosis not present

## 2023-10-29 DIAGNOSIS — Z139 Encounter for screening, unspecified: Secondary | ICD-10-CM | POA: Diagnosis not present

## 2023-10-29 HISTORY — PX: COLONOSCOPY WITH PROPOFOL: SHX5780

## 2023-10-29 HISTORY — PX: POLYPECTOMY: SHX5525

## 2023-10-29 LAB — HM COLONOSCOPY

## 2023-10-29 SURGERY — COLONOSCOPY WITH PROPOFOL
Anesthesia: General

## 2023-10-29 MED ORDER — GLYCOPYRROLATE 0.2 MG/ML IJ SOLN
INTRAMUSCULAR | Status: DC | PRN
  Administered 2023-10-29: .2 mg via INTRAVENOUS

## 2023-10-29 MED ORDER — LIDOCAINE HCL (PF) 2 % IJ SOLN
INTRAMUSCULAR | Status: DC | PRN
  Administered 2023-10-29: 60 mg via INTRADERMAL

## 2023-10-29 MED ORDER — LACTATED RINGERS IV SOLN: INTRAVENOUS | Status: DC | PRN

## 2023-10-29 MED ORDER — PROPOFOL 10 MG/ML IV BOLUS
INTRAVENOUS | Status: DC | PRN
  Administered 2023-10-29: 10 mg via INTRAVENOUS
  Administered 2023-10-29: 20 mg via INTRAVENOUS
  Administered 2023-10-29: 10 mg via INTRAVENOUS
  Administered 2023-10-29: 80 mg via INTRAVENOUS
  Administered 2023-10-29: 20 mg via INTRAVENOUS

## 2023-10-29 MED ORDER — LACTATED RINGERS IV SOLN: INTRAVENOUS | Status: DC

## 2023-10-29 MED ORDER — EPHEDRINE SULFATE (PRESSORS) 50 MG/ML IJ SOLN
INTRAMUSCULAR | Status: DC | PRN
  Administered 2023-10-29: 5 mg via INTRAVENOUS

## 2023-10-29 NOTE — Telephone Encounter (Signed)
Kara Dies, MD; Marlowe Shores, LPN She was mailed the TCS/EGD recall letter 01/25/23, she returned questionnaire, Kenney Houseman reached out to patient in May and patient's husband told Kenney Houseman Ms Stingley was in the [place] We rec'd referral from Dr Lodema Hong in October for TCS, quesitonniare was mailed       Previous Messages    ----- Message ----- From: Dolores Frame, MD Sent: 10/29/2023  11:31 AM EST To: Simone Curia; Marlowe Shores, LPN  Hi Ann/Tamar Lipscomb, Did this patient get the EGD recall questionnaire? She was due for repeat EGD in 2024.  Can you please schedule a EGD in next available? Dx: gastric intestinal metaplasia. Room: any  Thanks,  Katrinka Blazing, MD Gastroenterology and Hepatology [facility] Gastroenterology

## 2023-10-29 NOTE — Discharge Instructions (Signed)
You are being discharged to home.  Resume your previous diet.  We are waiting for your pathology results.  Your physician has recommended a repeat colonoscopy for surveillance based on pathology results.

## 2023-10-29 NOTE — Anesthesia Preprocedure Evaluation (Signed)
Anesthesia Evaluation  Patient identified by MRN, date of birth, ID band Patient awake    Reviewed: Allergy & Precautions, H&P , NPO status , Patient's Chart, lab work & pertinent test results, reviewed documented beta blocker date and time   Airway Mallampati: II  TM Distance: >3 FB Neck ROM: full    Dental no notable dental hx.    Pulmonary neg pulmonary ROS   Pulmonary exam normal breath sounds clear to auscultation       Cardiovascular Exercise Tolerance: Good hypertension, + CAD and + Cardiac Stents   Rhythm:regular Rate:Normal     Neuro/Psych  Headaches PSYCHIATRIC DISORDERS Anxiety Depression     Neuromuscular disease negative neurological ROS  negative psych ROS   GI/Hepatic negative GI ROS, Neg liver ROS,GERD  ,,  Endo/Other  negative endocrine ROSHypothyroidism    Renal/GU negative Renal ROS  negative genitourinary   Musculoskeletal   Abdominal   Peds  Hematology negative hematology ROS (+)   Anesthesia Other Findings   Reproductive/Obstetrics negative OB ROS                             Anesthesia Physical Anesthesia Plan  ASA: 3  Anesthesia Plan: General   Post-op Pain Management:    Induction:   PONV Risk Score and Plan: Propofol infusion  Airway Management Planned:   Additional Equipment:   Intra-op Plan:   Post-operative Plan:   Informed Consent: I have reviewed the patients History and Physical, chart, labs and discussed the procedure including the risks, benefits and alternatives for the proposed anesthesia with the patient or authorized representative who has indicated his/her understanding and acceptance.     Dental Advisory Given  Plan Discussed with: CRNA  Anesthesia Plan Comments:        Anesthesia Quick Evaluation

## 2023-10-29 NOTE — Op Note (Signed)
[facility] Patient Name: Tanya [name] Procedure Date: 10/29/2023 7:17 AM MRN: [id number] Date of Birth: 27-May-1951 Attending MD: Katrinka Blazing , , [id number] CSN: [id number] Age: 73 Admit Type: Outpatient Procedure:                Colonoscopy Indications:              Surveillance: Personal history of adenomatous                            polyps on last colonoscopy > 5 years ago Providers:                Katrinka Blazing, Nena Polio, RN, Elinor Parkinson Referring MD:              Medicines:                Monitored Anesthesia Care Complications:            No immediate complications. Estimated Blood Loss:     Estimated blood loss: none. Procedure:                Pre-Anesthesia Assessment:                           - Prior to the procedure, a History and Physical                            was performed, and patient medications, allergies                            and sensitivities were reviewed. The patient's                            tolerance of previous anesthesia was reviewed.                           - The risks and benefits of the procedure and the                            sedation options and risks were discussed with the                            patient. All questions were answered and informed                            consent was obtained.                           - ASA Grade Assessment: II - A patient with mild                            systemic disease.                           After obtaining informed consent, the colonoscope                            was passed under  direct vision. Throughout the                            procedure, the patient's blood pressure, pulse, and                            oxygen saturations were monitored continuously. The                            [id number]-HQ190L ([id number]) scope was introduced through                            the anus and advanced to the the cecum, identified                            by appendiceal  orifice and ileocecal valve. The                            colonoscopy was performed without difficulty. The                            patient tolerated the procedure well. The quality                            of the bowel preparation was adequate. Scope In: 7:37:36 AM Scope Out: 7:56:38 AM Scope Withdrawal Time: 0 hours 12 minutes 8 seconds  Total Procedure Duration: 0 hours 19 minutes 2 seconds  Findings:      The perianal and digital rectal examinations were normal.      A 2 mm polyp was found in the ascending colon. The polyp was sessile.       The polyp was removed with a cold snare. Resection and retrieval were       complete.      Scattered medium-mouthed diverticula were found in the sigmoid colon,       descending colon and ascending colon.      Non-bleeding internal hemorrhoids were found during retroflexion. The       hemorrhoids were medium-sized. Impression:               - One 2 mm polyp in the ascending colon, removed                            with a cold snare. Resected and retrieved.                           - Diverticulosis in the sigmoid colon, in the                            descending colon and in the ascending colon.                           - Non-bleeding internal hemorrhoids. Moderate Sedation:      Per Anesthesia Care Recommendation:           - Discharge patient to home (ambulatory).                           -  Resume previous diet.                           - Await pathology results.                           - Repeat colonoscopy for surveillance based on                            pathology results. Procedure Code(s):        --- Professional ---                           [id number], Colonoscopy, flexible; with removal of                            tumor(s), polyp(s), or other lesion(s) by snare                            technique Diagnosis Code(s):        --- Professional ---                           Z86.010, Personal history of colonic polyps                            D12.2, Benign neoplasm of ascending colon                           K64.8, Other hemorrhoids                           K57.30, Diverticulosis of large intestine without                            perforation or abscess without bleeding CPT copyright 2022 American Medical Association. All rights reserved. The codes documented in this report are preliminary and upon coder review may  be revised to meet current compliance requirements. Katrinka Blazing, MD Katrinka Blazing,  10/29/2023 8:03:14 AM This report has been signed electronically. Number of Addenda: 0

## 2023-10-29 NOTE — H&P (Signed)
Tanya [name] is an 73 y.o. female.   Chief Complaint: history of colon polyps. HPI: Tanya [name] is a 73 y.o. female from [place] with PMH aneurysm surgery in 2020, CAD s/p stent, GERD, H. pylori gastritis and gastric intestinal metaplasia, coming for history of colon polyps.  Last colonoscopy in 2017, had one tubular adenoma removed. The patient denies having any complaints such as melena, hematochezia, abdominal pain or distention, change in her bowel movement consistency or frequency, no changes in weight recently.  No family history of colorectal cancer.   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 s/p DESx2 to proximal LAD in 06/2018 b. cath in 04/2019 showing 40% mid-LAD ISR with no significant stenosis along LM, LCx, or RCA   Cystitis    Depression    Elevated liver enzymes    Fatty liver    GERD (gastroesophageal reflux disease)    Hard of hearing    Headache    migraine   Hyperlipidemia    Hypertension    Insomnia    Migraine    Osteoarthritis    Unspecified hypothyroidism    Vaginitis    Wears glasses    Wears hearing aid    B/L    Past Surgical History:  Procedure Laterality Date   BIOPSY N/A 01/18/2021   Procedure: BIOPSY;  Surgeon: Dolores Frame, MD;  [facility]: [facility];  Service: Gastroenterology;  Laterality: N/A;   BREAST BIOPSY Right    unsure when/benign   CATARACT EXTRACTION W/PHACO Right 05/08/2019   Procedure: CATARACT EXTRACTION PHACO AND INTRAOCULAR LENS PLACEMENT (IOC);  Surgeon: Fabio Pierce, MD;  Location: [facility] ORS;  Service: Ophthalmology;  Laterality: Right;  CDE: 10.02   COLONOSCOPY N/A 02/08/2016   Procedure: COLONOSCOPY;  Surgeon: Malissa Hippo, MD;  Location: [facility];  Service: Endoscopy;  Laterality: N/A;  1200 - moved to 11:45 - Ann to notify   CORONARY PRESSURE/FFR STUDY N/A 06/06/2018    Procedure: INTRAVASCULAR PRESSURE WIRE/FFR STUDY;  Surgeon: Swaziland, [name], MD;  Location: [facility] INVASIVE CV LAB;  Service: Cardiovascular;  Laterality: N/A;   CORONARY STENT INTERVENTION N/A 06/06/2018   Procedure: CORONARY STENT INTERVENTION;  Surgeon: Swaziland, [name], MD;  Location: [facility] INVASIVE CV LAB;  Service: Cardiovascular;  Laterality: N/A;   ESOPHAGOGASTRODUODENOSCOPY (EGD) WITH PROPOFOL N/A 01/18/2021   Procedure: ESOPHAGOGASTRODUODENOSCOPY (EGD) WITH PROPOFOL;  Surgeon: Dolores Frame, MD;  Location: [facility];  Service: Gastroenterology;  Laterality: N/A;  10:30AM   IR ANGIO INTRA EXTRACRAN SEL COM CAROTID INNOMINATE BILAT MOD SED  09/01/2019   IR ANGIO INTRA EXTRACRAN SEL INTERNAL CAROTID UNI L MOD SED  12/21/2019   IR ANGIO VERTEBRAL SEL VERTEBRAL BILAT MOD SED  09/01/2019   IR ANGIOGRAM FOLLOW UP STUDY  12/21/2019   IR TRANSCATH/EMBOLIZ  12/21/2019   LEFT HEART CATH AND CORONARY ANGIOGRAPHY N/A 04/24/2019   Procedure: LEFT HEART CATH AND CORONARY ANGIOGRAPHY;  Surgeon: Lyn Records, MD;  Location: [facility] INVASIVE CV LAB;  Service: Cardiovascular;  Laterality: N/A;   LEFT HEART CATHETERIZATION WITH CORONARY ANGIOGRAM N/A 11/19/2014   Procedure: LEFT HEART CATHETERIZATION WITH CORONARY ANGIOGRAM;  Surgeon: Marykay Lex, MD;  Location: [facility] CATH LAB;  Service: Cardiovascular;  Laterality: N/A;   RADIOLOGY WITH ANESTHESIA N/A 11/23/2019   Procedure: Otho Bellows;  Surgeon:  Julieanne Cotton, MD;  Location: [facility] OR;  Service: Radiology;  Laterality: N/A;   RADIOLOGY WITH ANESTHESIA N/A 12/21/2019   Procedure: EMBOLIZATION;  Surgeon: Julieanne Cotton, MD;  Location: [facility] OR;  Service: Radiology;  Laterality: N/A;   RIGHT/LEFT HEART CATH AND CORONARY ANGIOGRAPHY N/A 06/06/2018   Procedure: RIGHT/LEFT HEART CATH AND CORONARY ANGIOGRAPHY;  Surgeon: Swaziland, [name], MD;  Location: [facility] INVASIVE CV LAB;  Service: Cardiovascular;  Laterality: N/A;    Family History  Problem  Relation Age of Onset   Dementia Mother    Heart disease Mother        heart attack    Hypertension Mother    Heart attack Father    Heart disease Father        heart attack    Hypertension Father    Social History:  reports that she has never smoked. She has never been exposed to tobacco smoke. She has never used smokeless tobacco. She reports that she does not drink alcohol and does not use drugs.  Allergies: No Known Allergies  Medications Prior to Admission  Medication Sig Dispense Refill   albuterol (VENTOLIN HFA) 108 (90 Base) MCG/ACT inhaler Inhale 2 puffs into the lungs every 6 (six) hours as needed for wheezing or shortness of breath. 8 g 0   amLODipine (NORVASC) 5 MG tablet Take 1 tablet (5 mg total) by mouth daily. 90 tablet 3   Ascorbic Acid (VITAMIN C) 1000 MG tablet Take 1,000 mg by mouth daily.     cloNIDine (CATAPRES) 0.3 MG tablet Take 1 tablet (0.3 mg total) by mouth at bedtime. 5 tablet 0   metoprolol succinate (TOPROL-XL) 25 MG 24 hr tablet Take 0.5 tablets (12.5 mg total) by mouth daily. 45 tablet 3   omeprazole (PRILOSEC) 40 MG capsule TAKE 1 CAPSULE BY MOUTH DAILY 100 capsule 2   PARoxetine (PAXIL) 10 MG tablet TAKE 1 TABLET BY MOUTH ONCE  DAILY 90 tablet 3   Potassium Chloride ER 20 MEQ TBCR TAKE 1 TABLET BY MOUTH DAILY 100 tablet 2   prednisoLONE acetate (PRED FORTE) 1 % ophthalmic suspension Place 1 drop into both eyes daily.     rosuvastatin (CRESTOR) 40 MG tablet TAKE 1 TABLET BY MOUTH DAILY 100 tablet 2   temazepam (RESTORIL) 15 MG capsule Take 1 capsule (15 mg total) by mouth at bedtime as needed for sleep. 30 capsule 5   topiramate (TOPAMAX) 50 MG tablet TAKE 1 TABLET BY MOUTH AT  BEDTIME 9 tablet 2   acetaminophen (TYLENOL) 500 MG tablet Take 1,000 mg by mouth every 6 (six) hours as needed for moderate pain.     aspirin EC 81 MG tablet Take 81 mg by mouth daily. Swallow whole.     cholecalciferol (VITAMIN D3) 25 MCG (1000 UNIT) tablet Take 1,000 Units by  mouth daily.     clopidogrel (PLAVIX) 75 MG tablet TAKE 1 TABLET BY MOUTH DAILY 100 tablet 2   nitroGLYCERIN (NITROSTAT) 0.4 MG SL tablet Place 1 tablet (0.4 mg total) under the tongue every 5 (five) minutes as needed. 25 tablet 3   UNABLE TO FIND Compression hose 15-53mmhg knee high Dx leg swelling 1 each 0   UNABLE TO FIND Med Name: TDAP VACCINE 1 each 0    No results found for this or any previous visit (from the past 48 hours). No results found.  Review of Systems  All other systems reviewed and are negative.   Blood pressure 126/64, pulse (!) 51, temperature 98  F (36.7 C), temperature source Oral, resp. rate 17, height 5\' 4"  (1.626 m), weight 63.4 kg, SpO2 98%. Physical Exam  GENERAL: The patient is AO x3, in no acute distress. HEENT: Head is normocephalic and atraumatic. EOMI are intact. Mouth is well hydrated and without lesions. NECK: Supple. No masses LUNGS: Clear to auscultation. No presence of rhonchi/wheezing/rales. Adequate chest expansion HEART: RRR, normal s1 and s2. ABDOMEN: Soft, nontender, no guarding, no peritoneal signs, and nondistended. BS +. No masses. EXTREMITIES: Without any cyanosis, clubbing, rash, lesions or edema. NEUROLOGIC: AOx3, no focal motor deficit. SKIN: no jaundice, no rashes  Assessment/Plan Tanya [name] is a 73 y.o. female from [place] with PMH aneurysm surgery in 2020, CAD s/p stent, GERD, H. pylori gastritis and gastric intestinal metaplasia, coming for history of colon polyps.  Will proceed with colonoscopy.  Dolores Frame, MD 10/29/2023, 7:27 AM

## 2023-10-29 NOTE — Transfer of Care (Signed)
Immediate Anesthesia Transfer of Care Note  Patient: Tanya [name]  Procedure(s) Performed: COLONOSCOPY WITH PROPOFOL POLYPECTOMY  Patient Location: PACU  Anesthesia Type:General  Level of Consciousness: awake, alert , oriented, and patient cooperative  Airway & Oxygen Therapy: Patient Spontanous Breathing  Post-op Assessment: Report given to RN, Post -op Vital signs reviewed and stable, and Patient moving all extremities X 4  Post vital signs: Reviewed and stable  Last Vitals:  Vitals Value Taken Time  BP 121/59 10/29/23 0800  Temp 36.4 C 10/29/23 0800  Pulse 93 10/29/23 0800  Resp 17 10/29/23 0800  SpO2 100 % 10/29/23 0800    Last Pain:  Vitals:   10/29/23 0800  TempSrc: Oral  PainSc: 0-No pain         Complications: No notable events documented.

## 2023-10-30 ENCOUNTER — Encounter (INDEPENDENT_AMBULATORY_CARE_PROVIDER_SITE_OTHER): Payer: Self-pay | Admitting: *Deleted

## 2023-10-30 ENCOUNTER — Encounter (HOSPITAL_COMMUNITY): Payer: Self-pay | Admitting: Gastroenterology

## 2023-10-30 LAB — SURGICAL PATHOLOGY

## 2023-10-30 NOTE — Telephone Encounter (Signed)
Pt husband contacted. Pt scheduled. Instructions mailed to pt. No PA needed.

## 2023-10-30 NOTE — Anesthesia Postprocedure Evaluation (Signed)
Anesthesia Post Note  Patient: Tanya [name]  Procedure(s) Performed: COLONOSCOPY WITH PROPOFOL POLYPECTOMY  Patient location during evaluation: Phase II Anesthesia Type: General Level of consciousness: awake Pain management: pain level controlled Vital Signs Assessment: post-procedure vital signs reviewed and stable Respiratory status: spontaneous breathing and respiratory function stable Cardiovascular status: blood pressure returned to baseline and stable Postop Assessment: no headache and no apparent nausea or vomiting Anesthetic complications: no Comments: Late entry   No notable events documented.   Last Vitals:  Vitals:   10/29/23 0700 10/29/23 0800  BP: 126/64 (!) 121/59  Pulse: (!) 51 93  Resp: 17 17  Temp: 36.7 C (!) 36.4 C  SpO2: 98% 100%    Last Pain:  Vitals:   10/29/23 0800  TempSrc: Oral  PainSc: 0-No pain                 Windell Norfolk

## 2023-10-31 ENCOUNTER — Encounter (INDEPENDENT_AMBULATORY_CARE_PROVIDER_SITE_OTHER): Payer: Self-pay | Admitting: *Deleted

## 2023-11-01 NOTE — Progress Notes (Deleted)
 Tanya [name], female    DOB: December 12, 1950,   MRN: [id number]  Brief patient profile:  73 yo [place] never smoker in [place] since 1980 and new onset doe x 2015  and progressed since to point where can't even even walk 50 ft "sometimes"    History of Present Illness  07/05/2020  Pulmonary/ 1st [name] eval/ Tanya [name] / [place] [name]  - sob 50 ft to [name] on day of 1st eval  Chief Complaint  Patient presents with   Pulmonary Consult    Referred by Randall An, PA.  Pt c/o SOB x 2 years. She states she was winded walking from lobby to nearest exam room today.   Dyspnea:  Sometimes a  Little sob at rest and worse "when walk anywhere" s assoc cp or sweating / nausea  Uses treadmill once or twice a week using x 30 min s stopping at slt tilt (can't pace higher due to headache)  Cough: sometimes but not often and no assoc with sob Sleep: ok flat/ one pillow  SABA use: none  rec To get the most out of exercise, you need to be continuously aware that you are short of breath, but never out of breath, for 30 minutes daily.     If not improving what you will need is a cpst - please call in 6 weeks to schedule if not impoving.  Late ADD:   Chart review suggests also dx of possible gerd/lpr so rec give diet: GERD diet   And be sure to take protonix 40 Take 30-60 min before first meal of the day including the day of the cpst.   09/28/2020  f/u ov/Tanya [name]/Tanya [name] re: unexplained sob/ fleeting cp and "really bad hb" not really sure what she takes for it. Chief Complaint  Patient presents with   Follow-up    Shortness of breath with exertion  Dyspnea:  No change Cough: none  Sleeping: not disturbing sleep  SABA use: none  02: none  CP on R ant comes and goes  "when is the last time you had it"  ?   A  Can't remember  How long does it last "maybe a second"  Rec Be sure you are taking Pantoprazole (protonix) 40 mg   Take  30-60 min before first meal of the day and add  Pepcid  (famotidine)  20 mg one after supper  until return to [name] - this is the best way to tell whether stomach acid is contributing to your problem. GERD  diet  If still having problems with breathing  after you finish your stomach problem evaluation then I will need to see you back with all your medications in hand to order to consider what the next step should be.     04/06/2022 Re-establish  ov/[place] [name]/Tanya [name] re: unexplained sob/ rest and ex  maint on ppi/ no asthma meds  Chief Complaint  Patient presents with   Follow-up    SOB is getting worse.   Dyspnea:  walking across the parking lot  Cough: variable, dry  Sleeping: no resp cc / bed flat one pillow SABA use: none  02: none  Gives variable answers to all questions except that she never has chest/ resp cc supine  Subxiphoid pain comes and goes for years, sometimes at rest, sometimes with exertion  Rec   Allergy eval for IgE  564>>>  ? Never went ?     11/04/2023  Re-establish  ov/[place] [name]/Tanya [name] re: *** maint on ***  No chief complaint on file.   Dyspnea:  *** Cough: *** Sleeping: ***   resp cc  SABA use: *** 02: ***  Lung cancer screening: ***   No obvious day to day or daytime variability or assoc excess/ purulent sputum or mucus plugs or hemoptysis or cp or chest tightness, subjective wheeze or overt sinus or hb symptoms.    Also denies any obvious fluctuation of symptoms with weather or environmental changes or other aggravating or alleviating factors except as outlined above   No unusual exposure hx or h/o childhood pna/ asthma or knowledge of premature birth.  Current Allergies, Complete Past Medical History, Past Surgical History, Family History, and Social History were reviewed in [employer] record.  ROS  The following are not active complaints unless bolded Hoarseness, sore throat, dysphagia, dental problems, itching, sneezing,  nasal congestion or discharge of excess mucus or  purulent secretions, ear ache,   fever, chills, sweats, unintended wt loss or wt gain, classically pleuritic or exertional cp,  orthopnea pnd or arm/hand swelling  or leg swelling, presyncope, palpitations, abdominal pain, anorexia, nausea, vomiting, diarrhea  or change in bowel habits or change in bladder habits, change in stools or change in urine, dysuria, hematuria,  rash, arthralgias, visual complaints, headache, numbness, weakness or ataxia or problems with walking or coordination,  change in mood or  memory.        No outpatient medications have been marked as taking for the 11/04/23 encounter (Appointment) with Nyoka Cowden, MD.             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 s/p DESx2 to proximal LAD in 06/2018 b. cath in 04/2019 showing 40% mid-LAD ISR with no significant stenosis along LM, LCx, or RCA   Cystitis    Elevated liver enzymes    Fatty liver    GERD (gastroesophageal reflux disease)    Headache    migraine   Hyperlipidemia    Hypertension    Insomnia    Unspecified hypothyroidism    Vaginitis    Wears glasses    Wears hearing aid    B/L        Objective:     Wts  11/04/2023          ***  04/06/2022         141   09/28/20 138 lb (62.6 kg)  07/22/20 136 lb 12.8 oz (62.1 kg)  07/21/20 135 lb (61.2 kg)      2/6 HSM ***  Trop 1  04/06/2022   =  6 with CP off and on daily x years  BNP  04/06/2022      =  48  CXR 04/06/2022 > did not go for cxr as requested      Assessment

## 2023-11-04 ENCOUNTER — Telehealth (INDEPENDENT_AMBULATORY_CARE_PROVIDER_SITE_OTHER): Payer: Self-pay | Admitting: Gastroenterology

## 2023-11-04 ENCOUNTER — Telehealth: Payer: Self-pay | Admitting: Family Medicine

## 2023-11-04 ENCOUNTER — Encounter: Payer: Self-pay | Admitting: Internal Medicine

## 2023-11-04 ENCOUNTER — Institutional Professional Consult (permissible substitution): Payer: Medicare Other | Admitting: Internal Medicine

## 2023-11-04 DIAGNOSIS — R0609 Other forms of dyspnea: Secondary | ICD-10-CM

## 2023-11-04 NOTE — Telephone Encounter (Signed)
Pt husband contacted and states pt needs to move EGD appt to April. Advised husband I do not have April schedule at this time but I would call once I have that schedule.

## 2023-11-04 NOTE — Telephone Encounter (Signed)
Patient spouse came into office she has been out for a week now this medication needs refill asap  cloNIDine (CATAPRES) 0.3 MG tablet [[id number]]    Pharmacy:  [facility]

## 2023-11-05 ENCOUNTER — Other Ambulatory Visit: Payer: Self-pay

## 2023-11-05 MED ORDER — CLONIDINE HCL 0.3 MG PO TABS: 0.3000 mg | ORAL_TABLET | Freq: Every day | ORAL | 0 refills | Status: DC

## 2023-11-05 NOTE — Telephone Encounter (Signed)
 Refills sent

## 2023-11-11 ENCOUNTER — Other Ambulatory Visit: Payer: Self-pay | Admitting: Family Medicine

## 2023-11-11 NOTE — Telephone Encounter (Signed)
 Copied from CRM [id number]. Topic: Clinical - Medication Refill >> Nov 11, 2023  1:07 PM Pattie Borders B wrote: Most Recent Primary Care Visit:  Provider: Towanda Fret  Department: [facility] CARE  Visit Type: OFFICE VISIT  Date: 09/13/2023  Medication: cloNIDine  (CATAPRES ) 0.3 MG tablet [[id number]] (REQUESTING 90 DAY SUPPLY)  Has the patient contacted their pharmacy? Yes-several times and needs to be sent (Agent: If no, request that the patient contact the pharmacy for the refill. If patient does not wish to contact the pharmacy document the reason why and proceed with request.) (Agent: If yes, when and what did the pharmacy advise?)  Is this the correct pharmacy for this prescription? yes If no, delete pharmacy and type the correct one.  This is the patient's preferred pharmacy:  [facility] [address] [place] - [address] [place] [address] Phone: [phone] Fax: [phone]   Has the prescription been filled recently? no  Is the patient out of the medication? YES  Has the patient been seen for an appointment in the last year OR does the patient have an upcoming appointment? yes  Can we respond through MyChart? yes  Agent: Please be advised that Rx refills may take up to 3 business days. We ask that you follow-up with your pharmacy.

## 2023-11-11 NOTE — Telephone Encounter (Signed)
 Patient spouse came by office needs a refill for cloNIDine  (CATAPRES ) 0.3 MG tablet [[id number]]  Does not have no refills on this medication. Patient is completely out of this medication.  [facility]  Needs automatic refills setup would be easier for spouse to pick it up.

## 2023-11-11 NOTE — Telephone Encounter (Signed)
 Last Fill: 11/05/23  Last OV: 09/13/23 Next OV: 11/21/23  Routing to provider for review/authorization.

## 2023-11-12 ENCOUNTER — Other Ambulatory Visit: Payer: Self-pay

## 2023-11-12 MED ORDER — CLONIDINE HCL 0.3 MG PO TABS: 0.3000 mg | ORAL_TABLET | Freq: Every day | ORAL | 1 refills | Status: DC

## 2023-11-12 NOTE — Telephone Encounter (Signed)
Refills sent

## 2023-11-19 ENCOUNTER — Other Ambulatory Visit: Payer: Self-pay | Admitting: Family Medicine

## 2023-11-21 ENCOUNTER — Encounter: Payer: Medicare Other | Admitting: Family Medicine

## 2023-12-04 ENCOUNTER — Ambulatory Visit (HOSPITAL_COMMUNITY): Admit: 2023-12-04 | Payer: Medicare Other | Admitting: Gastroenterology

## 2023-12-05 NOTE — Telephone Encounter (Signed)
 Pt husband Richard left message in regards to appt they had. Returned call to [employer] and he states pt is needing appt for EGD. Scheduled pt for 01/01/24 at 7:30am. Will mail instructions. No PA needed per insurance.

## 2023-12-06 ENCOUNTER — Telehealth (INDEPENDENT_AMBULATORY_CARE_PROVIDER_SITE_OTHER): Payer: Self-pay | Admitting: Gastroenterology

## 2023-12-06 NOTE — Telephone Encounter (Signed)
 Pt husband called in and needed to reschedule pt EGD for 4/2. Pt has been rescheduled to 01/15/24 at 11:00am. Will mail updated instructions to pt.

## 2023-12-09 ENCOUNTER — Other Ambulatory Visit: Payer: Self-pay | Admitting: Family Medicine

## 2023-12-09 DIAGNOSIS — I10 Essential (primary) hypertension: Secondary | ICD-10-CM

## 2023-12-19 ENCOUNTER — Ambulatory Visit (HOSPITAL_COMMUNITY)
Admission: RE | Admit: 2023-12-19 | Discharge: 2023-12-19 | Disposition: A | Discharge status: Home / Self Care | Payer: Medicare Other | Source: Ambulatory Visit | Attending: Family Medicine | Admitting: Family Medicine

## 2023-12-19 ENCOUNTER — Encounter (HOSPITAL_COMMUNITY): Payer: Self-pay

## 2023-12-19 DIAGNOSIS — Z780 Asymptomatic menopausal state: Secondary | ICD-10-CM | POA: Insufficient documentation

## 2023-12-19 DIAGNOSIS — Z1231 Encounter for screening mammogram for malignant neoplasm of breast: Secondary | ICD-10-CM | POA: Diagnosis not present

## 2023-12-19 DIAGNOSIS — M810 Age-related osteoporosis without current pathological fracture: Secondary | ICD-10-CM | POA: Diagnosis not present

## 2024-01-01 ENCOUNTER — Encounter (HOSPITAL_COMMUNITY): Payer: Self-pay

## 2024-01-01 SURGERY — ESOPHAGOGASTRODUODENOSCOPY (EGD) WITH PROPOFOL
Anesthesia: Monitor Anesthesia Care

## 2024-01-13 ENCOUNTER — Other Ambulatory Visit (HOSPITAL_COMMUNITY)

## 2024-01-15 ENCOUNTER — Other Ambulatory Visit: Payer: Self-pay

## 2024-01-15 ENCOUNTER — Ambulatory Visit (HOSPITAL_COMMUNITY)
Admission: RE | Admit: 2024-01-15 | Discharge: 2024-01-15 | Disposition: A | Discharge status: Home / Self Care | Source: Ambulatory Visit | Attending: Gastroenterology | Admitting: Gastroenterology

## 2024-01-15 ENCOUNTER — Encounter (HOSPITAL_COMMUNITY): Payer: Self-pay | Admitting: Gastroenterology

## 2024-01-15 ENCOUNTER — Ambulatory Visit (HOSPITAL_COMMUNITY): Admitting: Anesthesiology

## 2024-01-15 ENCOUNTER — Ambulatory Visit (HOSPITAL_BASED_OUTPATIENT_CLINIC_OR_DEPARTMENT_OTHER): Admitting: Anesthesiology

## 2024-01-15 ENCOUNTER — Encounter (HOSPITAL_COMMUNITY)
Admission: RE | Disposition: A | Discharge status: Home / Self Care | Payer: Self-pay | Source: Ambulatory Visit | Attending: Gastroenterology

## 2024-01-15 DIAGNOSIS — K31A19 Gastric intestinal metaplasia without dysplasia, unspecified site: Secondary | ICD-10-CM | POA: Diagnosis present

## 2024-01-15 DIAGNOSIS — I10 Essential (primary) hypertension: Secondary | ICD-10-CM | POA: Insufficient documentation

## 2024-01-15 DIAGNOSIS — K3189 Other diseases of stomach and duodenum: Secondary | ICD-10-CM

## 2024-01-15 DIAGNOSIS — Z955 Presence of coronary angioplasty implant and graft: Secondary | ICD-10-CM | POA: Diagnosis not present

## 2024-01-15 DIAGNOSIS — E039 Hypothyroidism, unspecified: Secondary | ICD-10-CM | POA: Diagnosis not present

## 2024-01-15 DIAGNOSIS — K2950 Unspecified chronic gastritis without bleeding: Secondary | ICD-10-CM | POA: Diagnosis not present

## 2024-01-15 DIAGNOSIS — K219 Gastro-esophageal reflux disease without esophagitis: Secondary | ICD-10-CM | POA: Insufficient documentation

## 2024-01-15 DIAGNOSIS — I2510 Atherosclerotic heart disease of native coronary artery without angina pectoris: Secondary | ICD-10-CM

## 2024-01-15 SURGERY — EGD (ESOPHAGOGASTRODUODENOSCOPY)
Anesthesia: General

## 2024-01-15 MED ORDER — PROPOFOL 10 MG/ML IV BOLUS
INTRAVENOUS | Status: DC | PRN
Start: 2024-01-15 — End: 2024-01-15
  Administered 2024-01-15: 160 mg via INTRAVENOUS
  Administered 2024-01-15 (×2): 50 mg via INTRAVENOUS

## 2024-01-15 MED ORDER — STERILE WATER FOR IRRIGATION IR SOLN
Status: DC | PRN
  Administered 2024-01-15: 120 mL

## 2024-01-15 MED ORDER — EPHEDRINE SULFATE-NACL 50-0.9 MG/10ML-% IV SOSY
PREFILLED_SYRINGE | INTRAVENOUS | Status: DC | PRN
  Administered 2024-01-15: 5 mg via INTRAVENOUS
  Administered 2024-01-15 (×2): 10 mg via INTRAVENOUS

## 2024-01-15 MED ORDER — LACTATED RINGERS IV SOLN
INTRAVENOUS | Status: DC | PRN
Start: 2024-01-15 — End: 2024-01-15

## 2024-01-15 NOTE — Transfer of Care (Signed)
 Immediate Anesthesia Transfer of Care Note  Patient: Tanya [name]  Procedure(s) Performed: EGD (ESOPHAGOGASTRODUODENOSCOPY)  Patient Location: Endoscopy Unit  Anesthesia Type:General  Level of Consciousness: drowsy and patient cooperative  Airway & Oxygen Therapy: Patient Spontanous Breathing  Post-op Assessment: Report given to RN and Post -op Vital signs reviewed and stable  Post vital signs: Reviewed and stable  Last Vitals:  Vitals Value Taken Time  BP 120/42 01/15/24 1028  Temp 36.3 C 01/15/24 1028  Pulse 80 01/15/24 1028  Resp 17 01/15/24 1028  SpO2 97 % 01/15/24 1024    Last Pain:  Vitals:   01/15/24 1028  TempSrc: Oral  PainSc: 0-No pain      Patients Stated Pain Goal: 9 (01/15/24 0935)  Complications: No notable events documented.

## 2024-01-15 NOTE — Discharge Instructions (Signed)
 You are being discharged to home.  Resume your previous diet.  We are waiting for your pathology results.  Your physician has recommended a repeat upper endoscopy for surveillance based on pathology results.

## 2024-01-15 NOTE — Anesthesia Postprocedure Evaluation (Signed)
 Anesthesia Post Note  Patient: Tanya [name]  Procedure(s) Performed: EGD (ESOPHAGOGASTRODUODENOSCOPY)  Patient location during evaluation: PACU Anesthesia Type: General Level of consciousness: awake and alert Pain management: pain level controlled Vital Signs Assessment: post-procedure vital signs reviewed and stable Respiratory status: spontaneous breathing, nonlabored ventilation, respiratory function stable and patient connected to nasal cannula oxygen Cardiovascular status: stable and blood pressure returned to baseline Postop Assessment: no apparent nausea or vomiting Anesthetic complications: no  No notable events documented.   Last Vitals:  Vitals:   01/15/24 1028 01/15/24 1032  BP: (!) 120/42 (!) 105/58  Pulse: 80   Resp: 17   Temp: (!) 36.3 C   SpO2:  97%    Last Pain:  Vitals:   01/15/24 1028  TempSrc: Oral  PainSc: 0-No pain                 Beacher Limerick

## 2024-01-15 NOTE — Op Note (Signed)
 [facility] Patient Name: Tanya [name] Procedure Date: 01/15/2024 9:49 AM MRN: [id number] Date of Birth: 09/17/1951 Attending MD: Katrinka Blazing , , [id number] CSN: [id number] Age: 73 Admit Type: Outpatient Procedure:                Upper GI endoscopy Indications:              Surveillance procedure - gastric intestinal                            metaplasia Providers:                Katrinka Blazing, Edrick Kins, RN, [place] Wilson,                            Technician, Lennice Sites Technician, Technician Referring MD:              Medicines:                Monitored Anesthesia Care Complications:            No immediate complications. Estimated Blood Loss:     Estimated blood loss: none. Procedure:                Pre-Anesthesia Assessment:                           - Prior to the procedure, a History and Physical                            was performed, and patient medications, allergies                            and sensitivities were reviewed. The patient's                            tolerance of previous anesthesia was reviewed.                           - The risks and benefits of the procedure and the                            sedation options and risks were discussed with the                            patient. All questions were answered and informed                            consent was obtained.                           - ASA Grade Assessment: II - A patient with mild                            systemic disease.                           After obtaining informed consent, the endoscope was  passed under direct vision. Throughout the                            procedure, the patient's blood pressure, pulse, and                            oxygen saturations were monitored continuously. The                            [id number]-[id number] ([id number]) scope was introduced through the                            mouth, and advanced to the second part of  duodenum.                            The upper GI endoscopy was accomplished without                            difficulty. The patient tolerated the procedure                            well. Scope In: 10:06:10 AM Scope Out: 10:13:44 AM Total Procedure Duration: 0 hours 7 minutes 34 seconds  Findings:      The esophagus was normal.      The Z-line was regular and was found 37 cm from the incisors.      Diffuse atrophic mucosa was found in the gastric antrum. Imaging was       performed using white light and narrow band imaging to visualize the       mucosa. Biopsies were taken with a cold forceps for histology following       [place] protocol.      The examined duodenum was normal. Impression:               - Normal esophagus.                           - Z-line regular, 37 cm from the incisors.                           - Gastric mucosal atrophy. Biopsied.                           - Normal examined duodenum. Moderate Sedation:      Per Anesthesia Care Recommendation:           - Discharge patient to home (ambulatory).                           - Resume previous diet.                           - Await pathology results.                           - Repeat upper endoscopy for surveillance based on  pathology results. Procedure Code(s):        --- Professional ---                           [phone], Esophagogastroduodenoscopy, flexible,                            transoral; with biopsy, single or multiple Diagnosis Code(s):        --- Professional ---                           K31.89, Other diseases of stomach and duodenum CPT copyright 2022 American Medical Association. All rights reserved. The codes documented in this report are preliminary and upon coder review may  be revised to meet current compliance requirements. Samantha Cress, MD Samantha Cress,  01/15/2024 10:22:25 AM This report has been signed electronically. Number of Addenda: 0

## 2024-01-15 NOTE — H&P (Signed)
 Tanya [name] is an 73 y.o. female.   Chief Complaint: gastric intestinal metaplasia. HPI: Tanya [name] is a 73 y.o. female from [place] with PMH aneurysm surgery in 2020, CAD s/p stent, GERD, H. pylori gastritis and gastric intestinal metaplasia, coming for surveillance of gastric intestinal metaplasia.  Patient has presented some intermittent episodes of nausea without vomiting.  She otherwise denies having any  fever, chills, hematochezia, melena, hematemesis, abdominal distention, abdominal pain, diarrhea, jaundice, pruritus or weight loss.   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 s/p DESx2 to proximal LAD in 06/2018 b. cath in 04/2019 showing 40% mid-LAD ISR with no significant stenosis along LM, LCx, or RCA   Cystitis    Depression    Elevated liver enzymes    Fatty liver    GERD (gastroesophageal reflux disease)    Hard of hearing    Headache    migraine   Hyperlipidemia    Hypertension    Insomnia    Migraine    Osteoarthritis    Unspecified hypothyroidism    Vaginitis    Wears glasses    Wears hearing aid    B/L    Past Surgical History:  Procedure Laterality Date   BIOPSY N/A 01/18/2021   Procedure: BIOPSY;  Surgeon: Dolores Frame, MD;  Location: [facility] END[facility];  Service: Gastroenterology;  Laterality: N/A;   BREAST BIOPSY Right    unsure when/benign   CATARACT EXTRACTION W/PHACO Right 05/08/2019   Procedure: CATARACT EXTRACTION PHACO AND INTRAOCULAR LENS PLACEMENT (IOC);  Surgeon: Fabio Pierce, MD;  Location: [facility] ORS;  Service: Ophthalmology;  Laterality: Right;  CDE: 10.02   COLONOSCOPY N/A 02/08/2016   Procedure: COLONOSCOPY;  Surgeon: Malissa Hippo, MD;  Location: [facility] END[facility];  Service: Endoscopy;  Laterality: N/A;  1200 - moved to 11:45 - Ann to notify   COLONOSCOPY WITH PROPOFOL N/A 10/29/2023   Procedure:  COLONOSCOPY WITH PROPOFOL;  Surgeon: Dolores Frame, MD;  Location: [facility];  Service: Gastroenterology;  Laterality: N/A;  10:45 AM, ASA 1   CORONARY PRESSURE/FFR STUDY N/A 06/06/2018   Procedure: INTRAVASCULAR PRESSURE WIRE/FFR STUDY;  Surgeon: Swaziland, [name], MD;  Location: [facility] INVASIVE CV LAB;  Service: Cardiovascular;  Laterality: N/A;   CORONARY STENT INTERVENTION N/A 06/06/2018   Procedure: CORONARY STENT INTERVENTION;  Surgeon: Swaziland, [name], MD;  Location: [facility] INVASIVE CV LAB;  Service: Cardiovascular;  Laterality: N/A;   ESOPHAGOGASTRODUODENOSCOPY (EGD) WITH PROPOFOL N/A 01/18/2021   Procedure: ESOPHAGOGASTRODUODENOSCOPY (EGD) WITH PROPOFOL;  Surgeon: Dolores Frame, MD;  Location: [facility];  Service: Gastroenterology;  Laterality: N/A;  10:30AM   IR ANGIO INTRA EXTRACRAN SEL COM CAROTID INNOMINATE BILAT MOD SED  09/01/2019   IR ANGIO INTRA EXTRACRAN SEL INTERNAL CAROTID UNI L MOD SED  12/21/2019   IR ANGIO VERTEBRAL SEL VERTEBRAL BILAT MOD SED  09/01/2019   IR ANGIOGRAM FOLLOW UP STUDY  12/21/2019   IR TRANSCATH/EMBOLIZ  12/21/2019   LEFT HEART CATH AND CORONARY ANGIOGRAPHY N/A 04/24/2019   Procedure: LEFT HEART CATH AND CORONARY ANGIOGRAPHY;  Surgeon: Lyn Records, MD;  Location: [facility] INVASIVE CV LAB;  Service: Cardiovascular;  Laterality: N/A;   LEFT HEART CATHETERIZATION WITH CORONARY ANGIOGRAM N/A 11/19/2014   Procedure: LEFT HEART CATHETERIZATION WITH CORONARY ANGIOGRAM;  Surgeon: Marykay Lex, MD;  Location: [facility] CATH LAB;  Service: Cardiovascular;  Laterality: N/A;   POLYPECTOMY  10/29/2023   Procedure: POLYPECTOMY;  Surgeon: Dolores Frame, MD;  Location: [facility];  Service: Gastroenterology;;   RADIOLOGY WITH ANESTHESIA N/A 11/23/2019   Procedure: Otho Bellows;  Surgeon: Julieanne Cotton, MD;  Location: [facility] OR;  Service: Radiology;  Laterality: N/A;   RADIOLOGY WITH ANESTHESIA N/A 12/21/2019   Procedure: EMBOLIZATION;   Surgeon: Julieanne Cotton, MD;  Location: [facility] OR;  Service: Radiology;  Laterality: N/A;   RIGHT/LEFT HEART CATH AND CORONARY ANGIOGRAPHY N/A 06/06/2018   Procedure: RIGHT/LEFT HEART CATH AND CORONARY ANGIOGRAPHY;  Surgeon: Swaziland, [name], MD;  Location: [facility] INVASIVE CV LAB;  Service: Cardiovascular;  Laterality: N/A;    Family History  Problem Relation Age of Onset   Dementia Mother    Heart disease Mother        heart attack    Hypertension Mother    Heart attack Father    Heart disease Father        heart attack    Hypertension Father    Social History:  reports that she has never smoked. She has never been exposed to tobacco smoke. She has never used smokeless tobacco. She reports that she does not drink alcohol and does not use drugs.  Allergies: No Known Allergies  Medications Prior to Admission  Medication Sig Dispense Refill   albuterol (VENTOLIN HFA) 108 (90 Base) MCG/ACT inhaler Inhale 2 puffs into the lungs every 6 (six) hours as needed for wheezing or shortness of breath. 8 g 0   amLODipine (NORVASC) 5 MG tablet Take 1 tablet (5 mg total) by mouth daily. 90 tablet 3   Ascorbic Acid (VITAMIN C) 1000 MG tablet Take 1,000 mg by mouth daily.     aspirin EC 81 MG tablet Take 81 mg by mouth daily. Swallow whole.     cloNIDine (CATAPRES) 0.3 MG tablet Take 1 tablet (0.3 mg total) by mouth at bedtime. 90 tablet 1   metoprolol succinate (TOPROL-XL) 25 MG 24 hr tablet Take 0.5 tablets (12.5 mg total) by mouth daily. 45 tablet 3   omeprazole (PRILOSEC) 40 MG capsule TAKE 1 CAPSULE BY MOUTH DAILY 100 capsule 2   PARoxetine (PAXIL) 10 MG tablet TAKE 1 TABLET BY MOUTH ONCE  DAILY 90 tablet 3   Potassium Chloride ER 20 MEQ TBCR TAKE 1 TABLET BY MOUTH DAILY 100 tablet 2   rosuvastatin (CRESTOR) 40 MG tablet TAKE 1 TABLET BY MOUTH DAILY 100 tablet 2   temazepam (RESTORIL) 15 MG capsule Take 1 capsule (15 mg total) by mouth at bedtime as needed for sleep. 30 capsule 5   topiramate  (TOPAMAX) 50 MG tablet TAKE 1 TABLET BY MOUTH AT  BEDTIME 9 tablet 2   acetaminophen (TYLENOL) 500 MG tablet Take 1,000 mg by mouth every 6 (six) hours as needed for moderate pain.     cholecalciferol (VITAMIN D3) 25 MCG (1000 UNIT) tablet Take 1,000 Units by mouth daily.     nitroGLYCERIN (NITROSTAT) 0.4 MG SL tablet Place 1 tablet (0.4 mg total) under the tongue every 5 (five) minutes as needed. 25 tablet 3   prednisoLONE acetate (PRED FORTE) 1 % ophthalmic suspension Place 1 drop into both eyes daily.     UNABLE TO FIND Compression hose 15-21mmhg knee high Dx leg swelling 1 each 0   UNABLE TO FIND Med Name: TDAP VACCINE 1 each 0    No results found for this or any previous visit (  from the past 48 hours). No results found.  Review of Systems  Gastrointestinal:  Positive for nausea.  All other systems reviewed and are negative.   Blood pressure 129/62, pulse (!) 51, temperature 97.8 F (36.6 C), temperature source Oral, resp. rate 15, height 5\' 4"  (1.626 m), weight 59 kg, SpO2 99%. Physical Exam  GENERAL: The patient is AO x3, in no acute distress. HEENT: Head is normocephalic and atraumatic. EOMI are intact. Mouth is well hydrated and without lesions. NECK: Supple. No masses LUNGS: Clear to auscultation. No presence of rhonchi/wheezing/rales. Adequate chest expansion HEART: RRR, normal s1 and s2. ABDOMEN: Soft, nontender, no guarding, no peritoneal signs, and nondistended. BS +. No masses. EXTREMITIES: Without any cyanosis, clubbing, rash, lesions or edema. NEUROLOGIC: AOx3, no focal motor deficit. SKIN: no jaundice, no rashes  Assessment/Plan Shakari A Evitts is a 73 y.o. female from [place] with PMH aneurysm surgery in 2020, CAD s/p stent, GERD, H. pylori gastritis and gastric intestinal metaplasia, coming for surveillance of gastric intestinal metaplasia.  Will proceed with EGD.  Urban Garden, MD 01/15/2024, 9:48 AM

## 2024-01-15 NOTE — Anesthesia Preprocedure Evaluation (Signed)
 Anesthesia Evaluation  Patient identified by MRN, date of birth, ID band Patient awake    Airway Mallampati: II  TM Distance: >3 FB Neck ROM: Full    Dental no notable dental hx.    Pulmonary    Pulmonary exam normal breath sounds clear to auscultation       Cardiovascular Exercise Tolerance: Good hypertension, + CAD  Normal cardiovascular exam Rhythm:Regular Rate:Normal  stent   Neuro/Psych  Headaches PSYCHIATRIC DISORDERS Anxiety Depression    Hx cerebral aneurysm s/p repair 5 years ago  Neuromuscular disease    GI/Hepatic Neg liver ROS,GERD  ,,  Endo/Other  Hypothyroidism    Renal/GU negative Renal ROS  negative genitourinary   Musculoskeletal  (+) Arthritis ,    Abdominal Normal abdominal exam  (+)   Peds  Hematology negative hematology ROS (+)   Anesthesia Other Findings   Reproductive/Obstetrics                             Anesthesia Physical Anesthesia Plan  ASA: 3  Anesthesia Plan: General   Post-op Pain Management:    Induction:   PONV Risk Score and Plan: Propofol infusion  Airway Management Planned: Nasal Cannula  Additional Equipment:   Intra-op Plan:   Post-operative Plan:   Informed Consent: I have reviewed the patients History and Physical, chart, labs and discussed the procedure including the risks, benefits and alternatives for the proposed anesthesia with the patient or authorized representative who has indicated his/her understanding and acceptance.     Dental advisory given  Plan Discussed with: CRNA  Anesthesia Plan Comments:        Anesthesia Quick Evaluation

## 2024-01-15 NOTE — Anesthesia Procedure Notes (Signed)
 Date/Time: 01/15/2024 9:59 AM  Performed by: Verline Glow, CRNAPre-anesthesia Checklist: Patient identified, Emergency Drugs available, Suction available, Timeout performed and Patient being monitored Patient Re-evaluated:Patient Re-evaluated prior to induction Oxygen Delivery Method: Nasal Cannula

## 2024-01-16 ENCOUNTER — Other Ambulatory Visit: Payer: Self-pay | Admitting: Family Medicine

## 2024-01-16 ENCOUNTER — Encounter (HOSPITAL_COMMUNITY): Payer: Self-pay | Admitting: Gastroenterology

## 2024-01-16 LAB — SURGICAL PATHOLOGY

## 2024-01-20 ENCOUNTER — Encounter (INDEPENDENT_AMBULATORY_CARE_PROVIDER_SITE_OTHER): Payer: Self-pay | Admitting: *Deleted

## 2024-01-20 ENCOUNTER — Ambulatory Visit: Payer: Medicare Other | Admitting: Internal Medicine

## 2024-01-21 ENCOUNTER — Telehealth: Payer: Self-pay | Admitting: Family Medicine

## 2024-01-21 NOTE — Telephone Encounter (Signed)
 Patient has 1 left  Prescription Request  01/21/2024  LOV: 09/13/2023  What is the name of the medication or equipment? temazepam  (RESTORIL ) 15 MG capsule [[id number]]   Have you contacted your pharmacy to request a refill? Yes   Which pharmacy would you like this sent to?  [facility] [place] [facility]  Patient notified that their request is being sent to the clinical staff for review and that they should receive a response within 2 business days.   Please advise at  walked into office

## 2024-01-22 ENCOUNTER — Other Ambulatory Visit: Payer: Self-pay | Admitting: Family Medicine

## 2024-01-22 ENCOUNTER — Other Ambulatory Visit: Payer: Self-pay | Admitting: Internal Medicine

## 2024-01-22 DIAGNOSIS — F419 Anxiety disorder, unspecified: Secondary | ICD-10-CM

## 2024-01-22 MED ORDER — TEMAZEPAM 15 MG PO CAPS: 15.0000 mg | ORAL_CAPSULE | Freq: Every evening | ORAL | 0 refills | Status: DC | PRN

## 2024-01-22 NOTE — Telephone Encounter (Signed)
 Last refill 09/13/23. Asking for Temazepam  refill. Pls advise

## 2024-01-22 NOTE — Telephone Encounter (Signed)
 Husband calling requesting a call.  Thanks

## 2024-01-23 ENCOUNTER — Telehealth: Payer: Self-pay

## 2024-01-23 NOTE — Telephone Encounter (Signed)
 Husband notified and he states he already picked up the med and was very appreciative.

## 2024-01-23 NOTE — Telephone Encounter (Signed)
 Copied from CRM [id number]. Topic: Clinical - Prescription Issue >> Jan 22, 2024  3:20 PM Lizabeth Riggs wrote: Reason for CRM:  Spouse is at pharmacy to have sleeping medication refilled. It is for medications temazepam  (RESTORIL ) 15 MG capsule. He came into the office yesterday and was told it would be sent in. She is out of the medication and will not sleep without it. I call the clinic and Dr. Rodolph Clap is out until next week and she will check with her nurse and either the nurse or her will call husband back. Call back number is [phone]. Thanks

## 2024-01-23 NOTE — Telephone Encounter (Signed)
Med refilled and pt aware  

## 2024-02-04 ENCOUNTER — Other Ambulatory Visit: Payer: Self-pay | Admitting: Family Medicine

## 2024-02-04 DIAGNOSIS — I10 Essential (primary) hypertension: Secondary | ICD-10-CM

## 2024-02-20 ENCOUNTER — Telehealth: Payer: Self-pay | Admitting: Family Medicine

## 2024-02-20 MED ORDER — TEMAZEPAM 15 MG PO CAPS
15.0000 mg | ORAL_CAPSULE | Freq: Every day | ORAL | 0 refills | Status: DC
Start: 2024-02-20 — End: 2024-03-04

## 2024-02-20 NOTE — Telephone Encounter (Signed)
 Patient completely out, asking for refills.  Prescription Request  02/20/2024  LOV: 09/13/2023  What is the name of the medication or equipment? temazepam  (RESTORIL ) 15 MG capsule [[id number]]   Have you contacted your pharmacy to request a refill? Yes   Which pharmacy would you like this sent to?   [facility]  Patient notified that their request is being sent to the clinical staff for review and that they should receive a response within 2 business days.   Please advise at spouse walked into office

## 2024-02-20 NOTE — Telephone Encounter (Signed)
 Refill sent x 1 month needs to keep ov

## 2024-02-22 ENCOUNTER — Other Ambulatory Visit: Payer: Self-pay | Admitting: Family Medicine

## 2024-03-04 ENCOUNTER — Ambulatory Visit (INDEPENDENT_AMBULATORY_CARE_PROVIDER_SITE_OTHER): Admitting: Family Medicine

## 2024-03-04 ENCOUNTER — Encounter: Payer: Self-pay | Admitting: Family Medicine

## 2024-03-04 VITALS — BP 118/67 | HR 58 | Resp 16 | Ht 64.0 in | Wt 134.1 lb

## 2024-03-04 DIAGNOSIS — M4712 Other spondylosis with myelopathy, cervical region: Secondary | ICD-10-CM | POA: Diagnosis not present

## 2024-03-04 DIAGNOSIS — R9389 Abnormal findings on diagnostic imaging of other specified body structures: Secondary | ICD-10-CM

## 2024-03-04 DIAGNOSIS — R053 Chronic cough: Secondary | ICD-10-CM

## 2024-03-04 DIAGNOSIS — G43019 Migraine without aura, intractable, without status migrainosus: Secondary | ICD-10-CM

## 2024-03-04 DIAGNOSIS — I10 Essential (primary) hypertension: Secondary | ICD-10-CM

## 2024-03-04 DIAGNOSIS — N3946 Mixed incontinence: Secondary | ICD-10-CM

## 2024-03-04 DIAGNOSIS — R8290 Unspecified abnormal findings in urine: Secondary | ICD-10-CM | POA: Diagnosis not present

## 2024-03-04 DIAGNOSIS — E7849 Other hyperlipidemia: Secondary | ICD-10-CM | POA: Diagnosis not present

## 2024-03-04 DIAGNOSIS — Z0001 Encounter for general adult medical examination with abnormal findings: Secondary | ICD-10-CM

## 2024-03-04 DIAGNOSIS — E559 Vitamin D deficiency, unspecified: Secondary | ICD-10-CM | POA: Diagnosis not present

## 2024-03-04 MED ORDER — TEMAZEPAM 15 MG PO CAPS: 15.0000 mg | ORAL_CAPSULE | Freq: Every day | ORAL | 5 refills | Status: DC

## 2024-03-04 MED ORDER — TOPIRAMATE 25 MG PO TABS: 25.0000 mg | ORAL_TABLET | Freq: Every day | ORAL | 3 refills | Status: DC

## 2024-03-04 NOTE — Patient Instructions (Addendum)
 Please schedule AWV at check out    F/u with medications in 8 weeks, call if you need me sooner  CCUA and reflex c/s today  Lipid, cmp and eGFR, CBC, TSH and vit d today  Use robitussin dM for cough  You are being referred back to [facility]    Start topamax  one at bedtime to reduce headaches  Neck pain is from arthritis  Thanks for choosing [facility], we consider it a privelige to serve you.

## 2024-03-05 ENCOUNTER — Ambulatory Visit: Payer: Self-pay | Admitting: Family Medicine

## 2024-03-05 ENCOUNTER — Encounter: Payer: Self-pay | Admitting: Family Medicine

## 2024-03-05 DIAGNOSIS — M47812 Spondylosis without myelopathy or radiculopathy, cervical region: Secondary | ICD-10-CM | POA: Insufficient documentation

## 2024-03-05 DIAGNOSIS — R8290 Unspecified abnormal findings in urine: Secondary | ICD-10-CM | POA: Insufficient documentation

## 2024-03-05 LAB — CBC WITH DIFFERENTIAL/PLATELET
Basophils Absolute: 0.1 10*3/uL (ref 0.0–0.2)
Basos: 2 %
EOS (ABSOLUTE): 0.2 10*3/uL (ref 0.0–0.4)
Eos: 6 %
Hematocrit: 41.1 % (ref 34.0–46.6)
Hemoglobin: 13.2 g/dL (ref 11.1–15.9)
Immature Grans (Abs): 0 10*3/uL (ref 0.0–0.1)
Immature Granulocytes: 0 %
Lymphocytes Absolute: 2 10*3/uL (ref 0.7–3.1)
Lymphs: 45 %
MCH: 31.6 pg (ref 26.6–33.0)
MCHC: 32.1 g/dL (ref 31.5–35.7)
MCV: 98 fL — ABNORMAL HIGH (ref 79–97)
Monocytes Absolute: 0.7 10*3/uL (ref 0.1–0.9)
Monocytes: 16 %
Neutrophils Absolute: 1.3 10*3/uL — ABNORMAL LOW (ref 1.4–7.0)
Neutrophils: 31 %
Platelets: 261 10*3/uL (ref 150–450)
RBC: 4.18 x10E6/uL (ref 3.77–5.28)
RDW: 13.2 % (ref 11.7–15.4)
WBC: 4.3 10*3/uL (ref 3.4–10.8)

## 2024-03-05 LAB — LIPID PANEL
Chol/HDL Ratio: 1.8 ratio (ref 0.0–4.4)
Cholesterol, Total: 124 mg/dL (ref 100–199)
HDL: 70 mg/dL (ref >39)
LDL Chol Calc (NIH): 41 mg/dL (ref 0–99)
Triglycerides: 57 mg/dL (ref 0–149)
VLDL Cholesterol Cal: 13 mg/dL (ref 5–40)

## 2024-03-05 LAB — CMP14+EGFR
ALT: 16 IU/L (ref 0–32)
AST: 24 IU/L (ref 0–40)
Albumin: 4.7 g/dL (ref 3.8–4.8)
Alkaline Phosphatase: 81 IU/L (ref 44–121)
BUN/Creatinine Ratio: 16 (ref 12–28)
BUN: 9 mg/dL (ref 8–27)
Bilirubin Total: <0.2 mg/dL (ref 0.0–1.2)
CO2: 20 mmol/L (ref 20–29)
Calcium: 9.5 mg/dL (ref 8.7–10.3)
Chloride: 106 mmol/L (ref 96–106)
Creatinine, Ser: 0.58 mg/dL (ref 0.57–1.00)
Globulin, Total: 2.5 g/dL (ref 1.5–4.5)
Glucose: 104 mg/dL — ABNORMAL HIGH (ref 70–99)
Potassium: 4.2 mmol/L (ref 3.5–5.2)
Sodium: 141 mmol/L (ref 134–144)
Total Protein: 7.2 g/dL (ref 6.0–8.5)
eGFR: 95 mL/min/{1.73_m2} (ref >59)

## 2024-03-05 LAB — UA/M W/RFLX CULTURE, ROUTINE
Bilirubin, UA: NEGATIVE
Glucose, UA: NEGATIVE
Ketones, UA: NEGATIVE
Leukocytes,UA: NEGATIVE
Nitrite, UA: NEGATIVE
Protein,UA: NEGATIVE
RBC, UA: NEGATIVE
Specific Gravity, UA: 1.007 (ref 1.005–1.030)
Urobilinogen, Ur: 0.2 mg/dL (ref 0.2–1.0)
pH, UA: 7.5 (ref 5.0–7.5)

## 2024-03-05 LAB — MICROSCOPIC EXAMINATION
Bacteria, UA: NONE SEEN
Casts: NONE SEEN /LPF
Epithelial Cells (non renal): NONE SEEN /HPF (ref 0–10)
RBC, Urine: NONE SEEN /HPF (ref 0–2)
WBC, UA: NONE SEEN /HPF (ref 0–5)

## 2024-03-05 LAB — TSH: TSH: 2.76 u[IU]/mL (ref 0.450–4.500)

## 2024-03-05 LAB — VITAMIN D 25 HYDROXY (VIT D DEFICIENCY, FRACTURES): Vit D, 25-Hydroxy: 32.1 ng/mL (ref 30.0–100.0)

## 2024-03-05 NOTE — Assessment & Plan Note (Signed)
 Annual exam as documented. . Immunization and cancer screening needs are specifically addressed at this visit.

## 2024-03-05 NOTE — Progress Notes (Signed)
    Tanya [name]     MRN: [id number]      DOB: [birth year]  Chief Complaint  Patient presents with   Annual Exam    cpe   Headache    Pt complains of intermittent headache and neck pain "for a while". Pt describes it as a throbbing pain      HPI: Patient is in for annual physical exam. C/o frequent throbbing headaches, is not taking topamax  as prescribed for prevewntion and will start and re address C/o neck pain and stiffness has established arthritis C/o chronic dry cough and shortness of breath , was referred to Pulmonary but did not follow through C/o malodorous urine x 1 week, no flank pain , fever or chills Immunization is reviewed , and  updated if needed.   PE: BP 118/67   Pulse (!) 58   Resp 16   Ht 5\' 4"  (1.626 m)   Wt 134 lb 1.9 oz (60.8 kg)   SpO2 98%   BMI 23.02 kg/m   Pleasant  female, alert and oriented x 3, in no cardio-pulmonary distress. Afebrile. HEENT No facial trauma or asymetry. Sinuses non tender.  Extra occullar muscles intact.. External ears normal, . Neck: decreased ROM wih spasm, no adenopathy,JVD or thyromegaly.No bruits.  Chest: Clear to ascultation bilaterally.No crackles or wheezes. Non tender to palpation  Breast: Asymptomatic, not examined , mammogram is normal and is up to date,  Cardiovascular system; Heart sounds normal,  S1 and  S2 ,no S3.  No murmur, or thrill. Apical beat not displaced Peripheral pulses normal.  Abdomen: Soft, non tender, no organomegaly or masses. No bruits. Bowel sounds normal. No guarding, tenderness or rebound.   .   Musculoskeletal exam: Full ROM of spine, hips , shoulders and knees. No deformity ,swelling or crepitus noted. No muscle wasting or atrophy.   Neurologic: Cranial nerves 2 to 12 intact. Power, tone ,sensation  normal throughout. No disturbance in gait. No tremor.  Skin: Intact, no ulceration, erythema , scaling or rash noted. Pigmentation normal  throughout  Psych; Normal mood and affect. Judgement and concentration normal   Assessment & Plan:  Encounter for Medicare annual examination with abnormal findings Annual exam as documented. . Immunization and cancer screening needs are specifically addressed at this visit.   Malodorous urine CCUA and c/s if abn  Migraine without aura Uncontrolled , frequent , neds to take topamax  as prescribe and re address no neurologic deficit on exam and by history  Chronic cough Over 1 year h/o chronic dry cough , dyspnea and an chest scan pulmoary to eval, will refer again  Abnormal CT scan, chest Pulmonary to eval and manage, pt needs to follow through  DJD (degenerative joint disease) of cervical spine Established arthritis in C spine with c/o neck pain, recommend stretching exercises and topical rubs

## 2024-03-05 NOTE — Assessment & Plan Note (Signed)
 Uncontrolled , frequent , neds to take topamax  as prescribe and re address no neurologic deficit on exam and by history

## 2024-03-05 NOTE — Assessment & Plan Note (Signed)
 Pulmonary to eval and manage, pt needs to follow through

## 2024-03-05 NOTE — Assessment & Plan Note (Signed)
CCUA and c/s if abn 

## 2024-03-05 NOTE — Assessment & Plan Note (Signed)
 Over 1 year h/o chronic dry cough , dyspnea and an chest scan pulmoary to eval, will refer again

## 2024-03-05 NOTE — Assessment & Plan Note (Signed)
 Established arthritis in C spine with c/o neck pain, recommend stretching exercises and topical rubs

## 2024-03-10 ENCOUNTER — Other Ambulatory Visit (HOSPITAL_COMMUNITY)
Admission: RE | Admit: 2024-03-10 | Discharge: 2024-03-10 | Disposition: A | Discharge status: Home / Self Care | Source: Ambulatory Visit | Attending: Cardiology | Admitting: Cardiology

## 2024-03-10 ENCOUNTER — Other Ambulatory Visit: Payer: Self-pay | Admitting: Cardiology

## 2024-03-10 ENCOUNTER — Ambulatory Visit: Payer: Medicare Other | Attending: Cardiology | Admitting: Cardiology

## 2024-03-10 ENCOUNTER — Encounter: Payer: Self-pay | Admitting: Cardiology

## 2024-03-10 VITALS — BP 138/80 | HR 66 | Ht 64.0 in | Wt 135.4 lb

## 2024-03-10 DIAGNOSIS — I2510 Atherosclerotic heart disease of native coronary artery without angina pectoris: Secondary | ICD-10-CM

## 2024-03-10 DIAGNOSIS — I10 Essential (primary) hypertension: Secondary | ICD-10-CM | POA: Diagnosis not present

## 2024-03-10 DIAGNOSIS — R0602 Shortness of breath: Secondary | ICD-10-CM

## 2024-03-10 DIAGNOSIS — R0600 Dyspnea, unspecified: Secondary | ICD-10-CM

## 2024-03-10 LAB — BASIC METABOLIC PANEL WITH GFR
Anion gap: 6 (ref 5–15)
BUN: 14 mg/dL (ref 8–23)
CO2: 23 mmol/L (ref 22–32)
Calcium: 9.4 mg/dL (ref 8.9–10.3)
Chloride: 110 mmol/L (ref 98–111)
Creatinine, Ser: 0.49 mg/dL (ref 0.44–1.00)
GFR, Estimated: >60 mL/min (ref >60)
Glucose, Bld: 93 mg/dL (ref 70–99)
Potassium: 4.4 mmol/L (ref 3.5–5.1)
Sodium: 139 mmol/L (ref 135–145)

## 2024-03-10 LAB — CBC
HCT: 41 % (ref 36.0–46.0)
Hemoglobin: 13.3 g/dL (ref 12.0–15.0)
MCH: 30.7 pg (ref 26.0–34.0)
MCHC: 32.4 g/dL (ref 30.0–36.0)
MCV: 94.7 fL (ref 80.0–100.0)
Platelets: 327 10*3/uL (ref 150–400)
RBC: 4.33 MIL/uL (ref 3.87–5.11)
RDW: 13.2 % (ref 11.5–15.5)
WBC: 4.7 10*3/uL (ref 4.0–10.5)
nRBC: 0 % (ref 0.0–0.2)

## 2024-03-10 NOTE — Progress Notes (Signed)
 Clinical Summary Ms. Brier is a 73 y.o.female seen today for follow up of the following medical problems.    1. CAD - s/p DESx2 to proximal LAD in 06/2018  - cath in 04/2019 showing 40% mid-LAD ISR with no significant stenosis along LM, LCx, or RCA. LVEDP 10     - no recent chest pains -progressing DOE.    2. HTN -  Compliant with meds   3. Hyperlipidemia - complant with statin     4. DOE - followed by pulmonary - working on regular exercise to improve conditioning.   - progressing SOB/DOE. DOE with walking from parking lot into office. DOE walking room to room at home - no long term cough, wheezing - some LE edema at times - no orthopnea, no PND. - no chest pain.  07/2023 echo: LVEF 65-70%, grade I dd, mild MR     5. Left leg swelling - left leg pains, left calf with walking - seen by vascular - has been chronic, prior US  was neg for DVT, reflux study with reflux of small GSV not thought to be significant  6. 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 s/p DESx2 to proximal LAD in 06/2018 b. cath in 04/2019 showing 40% mid-LAD ISR with no significant stenosis along LM, LCx, or RCA   Cystitis    Depression    Elevated liver enzymes    Fatty liver    GERD (gastroesophageal reflux disease)    Hard of hearing    Headache    migraine   Hyperlipidemia    Hypertension    Insomnia    Migraine    Osteoarthritis    Unspecified hypothyroidism    Vaginitis    Wears glasses    Wears hearing aid    B/L     No Known Allergies   Current Outpatient Medications  Medication Sig Dispense Refill   acetaminophen  (TYLENOL ) 500 MG tablet Take 1,000 mg by mouth every 6 (six) hours as needed for moderate pain.     albuterol  (VENTOLIN  HFA) 108 (90 Base) MCG/ACT inhaler Inhale 2 puffs into the lungs every 6 (six) hours as needed for  wheezing or shortness of breath. 8 g 0   amLODipine  (NORVASC ) 5 MG tablet Take 1 tablet (5 mg total) by mouth daily. 90 tablet 3   Ascorbic Acid  (VITAMIN C ) 1000 MG tablet Take 1,000 mg by mouth daily.     aspirin  EC 81 MG tablet Take 81 mg by mouth daily. Swallow whole.     cholecalciferol (VITAMIN D3) 25 MCG (1000 UNIT) tablet Take 1,000 Units by mouth daily.     cloNIDine  (CATAPRES ) 0.3 MG tablet Take 1 tablet (0.3 mg total) by mouth at bedtime. 90 tablet 1   metoprolol  succinate (TOPROL -XL) 25 MG 24 hr tablet TAKE ONE-HALF TABLET BY MOUTH  DAILY 50 tablet 2   nitroGLYCERIN  (NITROSTAT ) 0.4 MG SL tablet Place 1 tablet (0.4 mg total) under the tongue every 5 (five) minutes as needed. 25 tablet 3   omeprazole  (PRILOSEC) 40 MG capsule TAKE 1 CAPSULE BY MOUTH DAILY 100 capsule 2   PARoxetine  (PAXIL ) 10 MG tablet TAKE 1 TABLET BY MOUTH ONCE  DAILY 90 tablet 3   Potassium Chloride  ER 20 MEQ TBCR TAKE 1 TABLET BY MOUTH  DAILY 100 tablet 2   rosuvastatin  (CRESTOR ) 40 MG tablet TAKE 1 TABLET BY MOUTH DAILY 100 tablet 2   [START ON 03/20/2024] temazepam  (RESTORIL ) 15 MG capsule Take 1 capsule (15 mg total) by mouth at bedtime. 30 capsule 5   topiramate  (TOPAMAX ) 25 MG tablet Take 1 tablet (25 mg total) by mouth at bedtime. 30 tablet 3   UNABLE TO FIND Compression hose 15-7mmhg knee high Dx leg swelling 1 each 0   No current facility-administered medications for this visit.     Past Surgical History:  Procedure Laterality Date   BIOPSY N/A 01/18/2021   Procedure: BIOPSY;  Surgeon: Urban Garden, MD;  [facility]: [facility];  Service: Gastroenterology;  Laterality: N/A;   BREAST BIOPSY Right    unsure when/benign   CATARACT EXTRACTION W/PHACO Right 05/08/2019   Procedure: CATARACT EXTRACTION PHACO AND INTRAOCULAR LENS PLACEMENT (IOC);  Surgeon: Tarri Farm, MD;  Location: [facility];  Service: Ophthalmology;  Laterality: Right;  CDE: 10.02   COLONOSCOPY N/A 02/08/2016   Procedure:  COLONOSCOPY;  Surgeon: Ruby Corporal, MD;  [facility]: [facility];  Service: Endoscopy;  Laterality: N/A;  1200 - moved to 11:45 - Ann to notify   COLONOSCOPY WITH PROPOFOL  N/A 10/29/2023   Procedure: COLONOSCOPY WITH PROPOFOL ;  Surgeon: Urban Garden, MD;  [facility]: [facility];  Service: Gastroenterology;  Laterality: N/A;  10:45 AM, ASA 1   CORONARY PRESSURE/FFR STUDY N/A 06/06/2018   Procedure: INTRAVASCULAR PRESSURE WIRE/FFR STUDY;  Surgeon: Swaziland, [name], MD;  Location: [facility] INVASIVE CV LAB;  Service: Cardiovascular;  Laterality: N/A;   CORONARY STENT INTERVENTION N/A 06/06/2018   Procedure: CORONARY STENT INTERVENTION;  Surgeon: Swaziland, [name], MD;  Location: [facility] INVASIVE CV LAB;  Service: Cardiovascular;  Laterality: N/A;   ESOPHAGOGASTRODUODENOSCOPY N/A 01/15/2024   Procedure: EGD (ESOPHAGOGASTRODUODENOSCOPY);  Surgeon: Umberto Ganong, Bearl Limes, MD;  [facility]: [facility];  Service: Gastroenterology;  Laterality: N/A;  7:30AM;ASA1-2   ESOPHAGOGASTRODUODENOSCOPY (EGD) WITH PROPOFOL  N/A 01/18/2021   Procedure: ESOPHAGOGASTRODUODENOSCOPY (EGD) WITH PROPOFOL ;  Surgeon: Urban Garden, MD;  [facility];  Service: Gastroenterology;  Laterality: N/A;  10:30AM   IR ANGIO INTRA EXTRACRAN SEL COM CAROTID INNOMINATE BILAT MOD SED  09/01/2019   IR ANGIO INTRA EXTRACRAN SEL INTERNAL CAROTID UNI L MOD SED  12/21/2019   IR ANGIO VERTEBRAL SEL VERTEBRAL BILAT MOD SED  09/01/2019   IR ANGIOGRAM FOLLOW UP STUDY  12/21/2019   IR TRANSCATH/EMBOLIZ  12/21/2019   LEFT HEART CATH AND CORONARY ANGIOGRAPHY N/A 04/24/2019   Procedure: LEFT HEART CATH AND CORONARY ANGIOGRAPHY;  Surgeon: Arty Binning, MD;  Location: [facility]AS[facility] CV LAB;  Service: Cardiovascular;  Laterality: N/A;   LEFT HEART CATHETERIZATION WITH CORONARY ANGIOGRAM N/A 11/19/2014   Procedure: LEFT HEART CATHETERIZATION WITH CORONARY ANGIOGRAM;  Surgeon: Arleen Lacer, MD;  Location: [facility] CATH LAB;  Service:  Cardiovascular;  Laterality: N/A;   POLYPECTOMY  10/29/2023   Procedure: POLYPECTOMY;  Surgeon: Urban Garden, MD;  Location: [facility];  Service: Gastroenterology;;   RADIOLOGY WITH ANESTHESIA N/A 11/23/2019   Procedure: Alfrieda Antes;  Surgeon: Luellen Sages, MD;  Location: [facility];  Service: Radiology;  Laterality: N/A;   RADIOLOGY WITH ANESTHESIA N/A 12/21/2019   Procedure: EMBOLIZATION;  Surgeon: Luellen Sages, MD;  Location: [facility];  Service: Radiology;  Laterality: N/A;   RIGHT/LEFT HEART CATH AND CORONARY ANGIOGRAPHY N/A 06/06/2018   Procedure: RIGHT/LEFT HEART CATH AND CORONARY ANGIOGRAPHY;  Surgeon: Swaziland, [name], MD;  Location: [facility] INVAS[facility] CV LAB;  Service: Cardiovascular;  Laterality: N/A;     No Known Allergies    Family History  Problem Relation Age of Onset   Dementia Mother    Heart disease Mother        heart attack    Hypertension Mother    Heart attack Father    Heart disease Father        heart attack    Hypertension Father      Social History Ms. Stocks reports that she has never smoked. She has never been exposed to tobacco smoke. She has never used smokeless tobacco. Ms. Sova reports no history of alcohol  use.    Physical Examination Today's Vitals   03/10/24 1057  BP: 138/80  Pulse: 66  SpO2: 97%  Weight: 135 lb 6.4 oz (61.4 kg)  Height: 5\' 4"  (1.626 m)   Body mass index is 23.24 kg/m.  Gen: resting comfortably, no acute distress HEENT: no scleral icterus, pupils equal round and reactive, no palptable cervical adenopathy,  CV: RRR, no mrg, no jvd Resp: Clear to auscultation bilaterally GI: abdomen is soft, non-tender, non-distended, normal bowel sounds, no hepatosplenomegaly MSK: extremities are warm, no edema.  Skin: warm, no rash Neuro:  no focal deficits Psych: appropriate affect   Diagnostic Studies  Cardiac Catheterization: 04/2019 40% eccentric mid LAD in-stent restenosis.  At completion of stent  procedure in September 2020, there was residual stent deformity in this region.  There is no high-grade obstruction in the LAD stent.  The LAD is otherwise normal. Normal left main Normal circumflex Normal RCA Normal LV systolic function and end-diastolic pressure   RECOMMENDATIONS:   Dyspnea cannot be explained by findings on today's study based on absence of significant coronary obstruction and normal LV hemodynamics. Continue dual antiplatelet therapy beyond 12 months due to overlapping stents in the LAD.   Echocardiogram: 06/2019 IMPRESSIONS     1. Left ventricular ejection fraction, by visual estimation, is 55 to  60%. The left ventricle has normal function. There is no left ventricular  hypertrophy.   2. Left ventricular diastolic Doppler parameters are consistent with  impaired relaxation pattern of LV diastolic filling.   3. Global right ventricle has normal systolic function.The right  ventricular size is normal. No increase in right ventricular wall  thickness.   4. Left atrial size was normal.   5. Right atrial size was normal.   6. The mitral valve is normal in structure. Trace mitral valve  regurgitation. No evidence of mitral stenosis.   7. The tricuspid valve is normal in structure. Tricuspid valve  regurgitation was not visualized by color flow Doppler.   8. The aortic valve is tricuspid Aortic valve regurgitation was not  visualized by color flow Doppler. Mild aortic valve sclerosis without  stenosis.   9. The pulmonic valve was not well visualized. Pulmonic valve  regurgitation is not visualized by color flow Doppler.  10. Normal pulmonary artery systolic pressure.  11. The inferior vena cava IVC is small, suggesting low RA pressure and  hypovolemia.    Long-term Monitor: 01/2020 Sinus rhythm with rare PACs and PVCs and short bursts of SVT (possibly atrial tachycardia).   Assessment and Plan  1. CAD - no chest pains but progressive DOE. She is frusterated  a cause has not been found from extensive prior testing. We will plan for a definitivie RHC/LHC to evaluate her coronaries and hemodynamics to more definitively assess any cardiac cause of her symptoms.  -  EKG shows NSR, no ischemic changes   2. HTN - at goal, continue current meds     Informed Consent   Shared Decision Making/Informed Consent The risks [stroke (1 in 1000), death (1 in 1000), kidney failure [usually temporary] (1 in 500), bleeding (1 in 200), allergic reaction [possibly serious] (1 in 200)], benefits (diagnostic support and management of coronary artery disease) and alternatives of a cardiac catheterization were discussed in detail with Ms. Lebo and she is willing to proceed.         Laurann Pollock, M.D.

## 2024-03-10 NOTE — H&P (View-Only) (Signed)
 Clinical Summary Tanya [name] is a 73 y.o.female seen today for follow up of the following medical problems.    1. CAD - s/p DESx2 to proximal LAD in 06/2018  - cath in 04/2019 showing 40% mid-LAD ISR with no significant stenosis along LM, LCx, or RCA. LVEDP 10     - no recent chest pains -progressing DOE.    2. HTN -  Compliant with meds   3. Hyperlipidemia - complant with statin     4. DOE - followed by pulmonary - working on regular exercise to improve conditioning.   - progressing SOB/DOE. DOE with walking from parking lot into office. DOE walking room to room at home - no long term cough, wheezing - some LE edema at times - no orthopnea, no PND. - no chest pain.  07/2023 echo: LVEF 65-70%, grade I dd, mild MR     5. Left leg swelling - left leg pains, left calf with walking - seen by vascular - has been chronic, prior US  was neg for DVT, reflux study with reflux of small GSV not thought to be significant  6. 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 s/p DESx2 to proximal LAD in 06/2018 b. cath in 04/2019 showing 40% mid-LAD ISR with no significant stenosis along LM, LCx, or RCA   Cystitis    Depression    Elevated liver enzymes    Fatty liver    GERD (gastroesophageal reflux disease)    Hard of hearing    Headache    migraine   Hyperlipidemia    Hypertension    Insomnia    Migraine    Osteoarthritis    Unspecified hypothyroidism    Vaginitis    Wears glasses    Wears hearing aid    B/L     No Known Allergies   Current Outpatient Medications  Medication Sig Dispense Refill   acetaminophen  (TYLENOL ) 500 MG tablet Take 1,000 mg by mouth every 6 (six) hours as needed for moderate pain.     albuterol  (VENTOLIN  HFA) 108 (90 Base) MCG/ACT inhaler Inhale 2 puffs into the lungs every 6 (six) hours as needed for  wheezing or shortness of breath. 8 g 0   amLODipine  (NORVASC ) 5 MG tablet Take 1 tablet (5 mg total) by mouth daily. 90 tablet 3   Ascorbic Acid  (VITAMIN C ) 1000 MG tablet Take 1,000 mg by mouth daily.     aspirin  EC 81 MG tablet Take 81 mg by mouth daily. Swallow whole.     cholecalciferol (VITAMIN D3) 25 MCG (1000 UNIT) tablet Take 1,000 Units by mouth daily.     cloNIDine  (CATAPRES ) 0.3 MG tablet Take 1 tablet (0.3 mg total) by mouth at bedtime. 90 tablet 1   metoprolol  succinate (TOPROL -XL) 25 MG 24 hr tablet TAKE ONE-HALF TABLET BY MOUTH  DAILY 50 tablet 2   nitroGLYCERIN  (NITROSTAT ) 0.4 MG SL tablet Place 1 tablet (0.4 mg total) under the tongue every 5 (five) minutes as needed. 25 tablet 3   omeprazole  (PRILOSEC) 40 MG capsule TAKE 1 CAPSULE BY MOUTH DAILY 100 capsule 2   PARoxetine  (PAXIL ) 10 MG tablet TAKE 1 TABLET BY MOUTH ONCE  DAILY 90 tablet 3   Potassium Chloride  ER 20 MEQ TBCR TAKE 1 TABLET BY MOUTH  DAILY 100 tablet 2   rosuvastatin  (CRESTOR ) 40 MG tablet TAKE 1 TABLET BY MOUTH DAILY 100 tablet 2   [START ON 03/20/2024] temazepam  (RESTORIL ) 15 MG capsule Take 1 capsule (15 mg total) by mouth at bedtime. 30 capsule 5   topiramate  (TOPAMAX ) 25 MG tablet Take 1 tablet (25 mg total) by mouth at bedtime. 30 tablet 3   UNABLE TO FIND Compression hose 15-7mmhg knee high Dx leg swelling 1 each 0   No current facility-administered medications for this visit.     Past Surgical History:  Procedure Laterality Date   BIOPSY N/A 01/18/2021   Procedure: BIOPSY;  Surgeon: Urban Garden, MD;  [facility]: [facility];  Service: Gastroenterology;  Laterality: N/A;   BREAST BIOPSY Right    unsure when/benign   CATARACT EXTRACTION W/PHACO Right 05/08/2019   Procedure: CATARACT EXTRACTION PHACO AND INTRAOCULAR LENS PLACEMENT (IOC);  Surgeon: Tarri Farm, MD;  Location: [facility];  Service: Ophthalmology;  Laterality: Right;  CDE: 10.02   COLONOSCOPY N/A 02/08/2016   Procedure:  COLONOSCOPY;  Surgeon: Ruby Corporal, MD;  [facility]: [facility];  Service: Endoscopy;  Laterality: N/A;  1200 - moved to 11:45 - Ann to notify   COLONOSCOPY WITH PROPOFOL  N/A 10/29/2023   Procedure: COLONOSCOPY WITH PROPOFOL ;  Surgeon: Urban Garden, MD;  Location: [facility];  Service: Gastroenterology;  Laterality: N/A;  10:45 AM, ASA 1   CORONARY PRESSURE/FFR STUDY N/A 06/06/2018   Procedure: INTRAVASCULAR PRESSURE WIRE/FFR STUDY;  Surgeon: Swaziland, [name], MD;  Location: [facility] INVASIVE CV LAB;  Service: Cardiovascular;  Laterality: N/A;   CORONARY STENT INTERVENTION N/A 06/06/2018   Procedure: CORONARY STENT INTERVENTION;  Surgeon: Swaziland, [name], MD;  Location: [facility] INVASIVE CV LAB;  Service: Cardiovascular;  Laterality: N/A;   ESOPHAGOGASTRODUODENOSCOPY N/A 01/15/2024   Procedure: EGD (ESOPHAGOGASTRODUODENOSCOPY);  Surgeon: Umberto Ganong, Bearl Limes, MD;  Location: [facility];  Service: Gastroenterology;  Laterality: N/A;  7:30AM;ASA1-2   ESOPHAGOGASTRODUODENOSCOPY (EGD) WITH PROPOFOL  N/A 01/18/2021   Procedure: ESOPHAGOGASTRODUODENOSCOPY (EGD) WITH PROPOFOL ;  Surgeon: Urban Garden, MD;  [facility]: [facility];  Service: Gastroenterology;  Laterality: N/A;  10:30AM   IR ANGIO INTRA EXTRACRAN SEL COM CAROTID INNOMINATE BILAT MOD SED  09/01/2019   IR ANGIO INTRA EXTRACRAN SEL INTERNAL CAROTID UNI L MOD SED  12/21/2019   IR ANGIO VERTEBRAL SEL VERTEBRAL BILAT MOD SED  09/01/2019   IR ANGIOGRAM FOLLOW UP STUDY  12/21/2019   IR TRANSCATH/EMBOLIZ  12/21/2019   LEFT HEART CATH AND CORONARY ANGIOGRAPHY N/A 04/24/2019   Procedure: LEFT HEART CATH AND CORONARY ANGIOGRAPHY;  Surgeon: Arty Binning, MD;  Location: [facility];  Service: Cardiovascular;  Laterality: N/A;   LEFT HEART CATHETERIZATION WITH CORONARY ANGIOGRAM N/A 11/19/2014   Procedure: LEFT HEART CATHETERIZATION WITH CORONARY ANGIOGRAM;  Surgeon: Arleen Lacer, MD;  Location: [facility] CATH LAB;  Service:  Cardiovascular;  Laterality: N/A;   POLYPECTOMY  10/29/2023   Procedure: POLYPECTOMY;  Surgeon: Urban Garden, MD;  Location: [facility];  Service: Gastroenterology;;   RADIOLOGY WITH ANESTHESIA N/A 11/23/2019   Procedure: Alfrieda Antes;  Surgeon: Luellen Sages, MD;  Location: [facility];  Service: Radiology;  Laterality: N/A;   RADIOLOGY WITH ANESTHESIA N/A 12/21/2019   Procedure: EMBOLIZATION;  Surgeon: Luellen Sages, MD;  Location: [facility];  Service: Radiology;  Laterality: N/A;   RIGHT/LEFT HEART CATH AND CORONARY ANGIOGRAPHY N/A 06/06/2018   Procedure: RIGHT/LEFT HEART CATH AND CORONARY ANGIOGRAPHY;  Surgeon: Swaziland, [name], MD;  Location: [facility] INVAS[facility] CV LAB;  Service: Cardiovascular;  Laterality: N/A;     No Known Allergies    Family History  Problem Relation Age of Onset   Dementia Mother    Heart disease Mother        heart attack    Hypertension Mother    Heart attack Father    Heart disease Father        heart attack    Hypertension Father      Social History Ms. Stocks reports that she has never smoked. She has never been exposed to tobacco smoke. She has never used smokeless tobacco. Ms. Sova reports no history of alcohol  use.    Physical Examination Today's Vitals   03/10/24 1057  BP: 138/80  Pulse: 66  SpO2: 97%  Weight: 135 lb 6.4 oz (61.4 kg)  Height: 5\' 4"  (1.626 m)   Body mass index is 23.24 kg/m.  Gen: resting comfortably, no acute distress HEENT: no scleral icterus, pupils equal round and reactive, no palptable cervical adenopathy,  CV: RRR, no mrg, no jvd Resp: Clear to auscultation bilaterally GI: abdomen is soft, non-tender, non-distended, normal bowel sounds, no hepatosplenomegaly MSK: extremities are warm, no edema.  Skin: warm, no rash Neuro:  no focal deficits Psych: appropriate affect   Diagnostic Studies  Cardiac Catheterization: 04/2019 40% eccentric mid LAD in-stent restenosis.  At completion of stent  procedure in September 2020, there was residual stent deformity in this region.  There is no high-grade obstruction in the LAD stent.  The LAD is otherwise normal. Normal left main Normal circumflex Normal RCA Normal LV systolic function and end-diastolic pressure   RECOMMENDATIONS:   Dyspnea cannot be explained by findings on today's study based on absence of significant coronary obstruction and normal LV hemodynamics. Continue dual antiplatelet therapy beyond 12 months due to overlapping stents in the LAD.   Echocardiogram: 06/2019 IMPRESSIONS     1. Left ventricular ejection fraction, by visual estimation, is 55 to  60%. The left ventricle has normal function. There is no left ventricular  hypertrophy.   2. Left ventricular diastolic Doppler parameters are consistent with  impaired relaxation pattern of LV diastolic filling.   3. Global right ventricle has normal systolic function.The right  ventricular size is normal. No increase in right ventricular wall  thickness.   4. Left atrial size was normal.   5. Right atrial size was normal.   6. The mitral valve is normal in structure. Trace mitral valve  regurgitation. No evidence of mitral stenosis.   7. The tricuspid valve is normal in structure. Tricuspid valve  regurgitation was not visualized by color flow Doppler.   8. The aortic valve is tricuspid Aortic valve regurgitation was not  visualized by color flow Doppler. Mild aortic valve sclerosis without  stenosis.   9. The pulmonic valve was not well visualized. Pulmonic valve  regurgitation is not visualized by color flow Doppler.  10. Normal pulmonary artery systolic pressure.  11. The inferior vena cava IVC is small, suggesting low RA pressure and  hypovolemia.    Long-term Monitor: 01/2020 Sinus rhythm with rare PACs and PVCs and short bursts of SVT (possibly atrial tachycardia).   Assessment and Plan  1. CAD - no chest pains but progressive DOE. She is frusterated  a cause has not been found from extensive prior testing. We will plan for a definitivie RHC/LHC to evaluate her coronaries and hemodynamics to more definitively assess any cardiac cause of her symptoms.  -  EKG shows NSR, no ischemic changes   2. HTN - at goal, continue current meds     Informed Consent   Shared Decision Making/Informed Consent The risks [stroke (1 in 1000), death (1 in 1000), kidney failure [usually temporary] (1 in 500), bleeding (1 in 200), allergic reaction [possibly serious] (1 in 200)], benefits (diagnostic support and management of coronary artery disease) and alternatives of a cardiac catheterization were discussed in detail with Ms. Lebo and she is willing to proceed.         Laurann Pollock, M.D.

## 2024-03-10 NOTE — Patient Instructions (Signed)
 Medication Instructions:  Your physician recommends that you continue on your current medications as directed. Please refer to the Current Medication list given to you today.   *If you need a refill on your cardiac medications before your next appointment, please call your pharmacy*  Lab Work: Your physician recommends that you return for lab work today. ( CBC, BMET)   If you have labs (blood work) drawn today and your tests are completely normal, you will receive your results only by: MyChart Message (if you have MyChart) OR A paper copy in the mail If you have any lab test that is abnormal or we need to change your treatment, we will call you to review the results.  Testing/Procedures: Your physician has requested that you have a cardiac catheterization. Cardiac catheterization is used to diagnose and/or treat various heart conditions. Doctors may recommend this procedure for a number of different reasons. The most common reason is to evaluate chest pain. Chest pain can be a symptom of coronary artery disease (CAD), and cardiac catheterization can show whether plaque is narrowing or blocking your heart's arteries. This procedure is also used to evaluate the valves, as well as measure the blood flow and oxygen  levels in different parts of your heart. For further information please visit [email or web address] Please follow instruction sheet, as given.   Follow-Up: At [facility], you and your health needs are our priority.  As part of our continuing mission to provide you with exceptional heart care, our providers are all part of one team.  This team includes your primary Cardiologist (physician) and Advanced Practice Providers or APPs (Physician Assistants and Nurse Practitioners) who all work together to provide you with the care you need, when you need it.  Your next appointment:   4 week(s)  Provider:   You will see one of the following Advanced Practice Providers on your  designated Care Team:   [place], PA-C  [place], [place] Theotis Flake, [place]       We recommend signing up for the patient portal called "MyChart".  Sign up information is provided on this After Visit Summary.  MyChart is used to connect with patients for Virtual Visits (Telemedicine).  Patients are able to view lab/test results, encounter notes, upcoming appointments, etc.  Non-urgent messages can be sent to your provider as well.   To learn more about what you can do with MyChart, go to [email or web address].   Other Instructions Thank you for choosing [facility]!    [facility] A DEPT OF [facility] [place] [facility] [address]  [place] [address] Dept: [phone] Loc: [phone]  Livia A Sur  03/10/2024  You are scheduled for a Cardiac Catheterization on Friday, June 13 with Dr. Arnoldo Lapping.  1. Please arrive at the [facility] (Main Entrance A) at [facility]: [name] [place], [place] [address] at 8:30 AM (This time is 2 hour(s) before your procedure to ensure your preparation).   Free valet parking service is available. You will check in at ADMITTING. The support person will be asked to wait in the waiting room.  It is OK to have someone drop you off and come back when you are ready to be discharged.    Special note: Every effort is made to have your procedure done on time. Please understand that emergencies sometimes delay scheduled procedures.  2. Diet: Do not eat solid foods after midnight.  The patient  may have clear liquids until 5am upon the day of the procedure.  3. Labs: You will need to have blood drawn on Tuesday, June 10 at [facility].  You do not need to be fasting.  4. Medication instructions in preparation for your procedure:   Contrast Allergy: No  On the morning of your procedure, take your Aspirin  81 mg and any morning medicines NOT listed  above.  You may use sips of water .  5. Plan to go home the same day, you will only stay overnight if medically necessary. 6. Bring a current list of your medications and current insurance cards. 7. You MUST have a responsible person to drive you home. 8. Someone MUST be with you the first 24 hours after you arrive home or your discharge will be delayed. 9. Please wear clothes that are easy to get on and off and wear slip-on shoes.  Thank you for allowing us  to care for you!   -- [facility]

## 2024-03-11 ENCOUNTER — Telehealth: Payer: Self-pay | Admitting: *Deleted

## 2024-03-11 NOTE — Telephone Encounter (Signed)
 Cardiac Catheterization scheduled at [facility] for: Friday March 13, 2024 10:30 AM Arrival time [facility] Main Entrance A at: 8:30 AM  Nothing to eat after midnight prior to procedure, clear liquids until 5 AM day of procedure.  Medication instructions: -Usual morning medications can be taken with sips of water  including aspirin  81 mg.  Plan to go home the same day, you will only stay overnight if medically necessary.  You must have responsible adult to drive you home.  Someone must be with you the first 24 hours after you arrive home.  Reviewed procedure instructions with patient's husband (DPR).

## 2024-03-12 NOTE — Telephone Encounter (Signed)
 Copied from 03/12/24 Secure Chat Mylinda Asa pre-cert: Good morning, [employer] has denied this patient's heart cath. There is no option for P2P at this time. I will have to appeal with an urgent status and should have a response within 72 hours. We will need to r/s this patient at this time.   Patient's husband (DPR), Ellin Gutter, aware need to reschedule cath due to no insurance authorization at this time.  At Mr. Angela Barban request for Friday, cath has been rescheduled to Friday March 20, 2024, instructions reviewed with Mr Levora Reas. Mr Levora Reas is aware cath now scheduled with Dr Alyssa Backbone.  Cardiac Catheterization scheduled at [facility] for: Friday March 20, 2024 10:30 AM Arrival time [facility] Main Entrance A at: 8:30 AM  Nothing to eat after midnight prior to procedure, clear liquids until 5 AM day of procedure.  Medication instructions: -Usual morning medications can be taken with sips of water  including aspirin  81 mg.  Plan to go home the same day, you will only stay overnight if medically necessary.  You must have responsible adult to drive you home.  Someone must be with you the first 24 hours after you arrive home.

## 2024-03-17 ENCOUNTER — Telehealth: Payer: Self-pay | Admitting: Cardiology

## 2024-03-17 NOTE — Telephone Encounter (Signed)
 Per Pedro Bourgeois: Oh it was denied but is now approved after I appealed it.  AH Auth is on file.     Spouse made aware.

## 2024-03-17 NOTE — Telephone Encounter (Signed)
         Computer kicked out before I could complete previous note.    Please cancel her Cath on 03-20-24. Her insurance will not pay for it.c

## 2024-03-17 NOTE — Telephone Encounter (Signed)
 New Message :     Husband called to cancel her Cath on

## 2024-03-18 NOTE — Telephone Encounter (Signed)
 Reviewed 03/20/24 procedure instructions with patient's husband (DPR).

## 2024-03-20 ENCOUNTER — Encounter (HOSPITAL_COMMUNITY)
Admission: RE | Disposition: A | Discharge status: Home / Self Care | Source: Ambulatory Visit | Attending: Internal Medicine

## 2024-03-20 ENCOUNTER — Ambulatory Visit (HOSPITAL_COMMUNITY)
Admission: RE | Admit: 2024-03-20 | Discharge: 2024-03-20 | Disposition: A | Discharge status: Home / Self Care | Source: Ambulatory Visit | Attending: Internal Medicine | Admitting: Internal Medicine

## 2024-03-20 DIAGNOSIS — E785 Hyperlipidemia, unspecified: Secondary | ICD-10-CM | POA: Insufficient documentation

## 2024-03-20 DIAGNOSIS — I10 Essential (primary) hypertension: Secondary | ICD-10-CM | POA: Insufficient documentation

## 2024-03-20 DIAGNOSIS — Z955 Presence of coronary angioplasty implant and graft: Secondary | ICD-10-CM | POA: Diagnosis not present

## 2024-03-20 DIAGNOSIS — I2510 Atherosclerotic heart disease of native coronary artery without angina pectoris: Secondary | ICD-10-CM | POA: Insufficient documentation

## 2024-03-20 DIAGNOSIS — R079 Chest pain, unspecified: Secondary | ICD-10-CM

## 2024-03-20 LAB — POCT I-STAT EG7
Acid-base deficit: 6 mmol/L — ABNORMAL HIGH (ref 0.0–2.0)
Acid-base deficit: 6 mmol/L — ABNORMAL HIGH (ref 0.0–2.0)
Bicarbonate: 20.4 mmol/L (ref 20.0–28.0)
Bicarbonate: 20.2 mmol/L (ref 20.0–28.0)
Calcium, Ion: 1.2 mmol/L (ref 1.15–1.40)
Calcium, Ion: 1.22 mmol/L (ref 1.15–1.40)
HCT: 35 % — ABNORMAL LOW (ref 36.0–46.0)
HCT: 35 % — ABNORMAL LOW (ref 36.0–46.0)
Hemoglobin: 11.9 g/dL — ABNORMAL LOW (ref 12.0–15.0)
Hemoglobin: 11.9 g/dL — ABNORMAL LOW (ref 12.0–15.0)
O2 Saturation: 81 %
O2 Saturation: 78 %
Potassium: 3.3 mmol/L — ABNORMAL LOW (ref 3.5–5.1)
Potassium: 3.3 mmol/L — ABNORMAL LOW (ref 3.5–5.1)
Sodium: 145 mmol/L (ref 135–145)
Sodium: 144 mmol/L (ref 135–145)
TCO2: 22 mmol/L (ref 22–32)
TCO2: 21 mmol/L — ABNORMAL LOW (ref 22–32)
pCO2, Ven: 40.6 mmHg — ABNORMAL LOW (ref 44–60)
pCO2, Ven: 40.4 mmHg — ABNORMAL LOW (ref 44–60)
pH, Ven: 7.308 (ref 7.25–7.43)
pH, Ven: 7.307 (ref 7.25–7.43)
pO2, Ven: 49 mmHg — ABNORMAL HIGH (ref 32–45)
pO2, Ven: 46 mmHg — ABNORMAL HIGH (ref 32–45)

## 2024-03-20 LAB — POCT I-STAT 7, (LYTES, BLD GAS, ICA,H+H)
Acid-base deficit: 6 mmol/L — ABNORMAL HIGH (ref 0.0–2.0)
Bicarbonate: 18.7 mmol/L — ABNORMAL LOW (ref 20.0–28.0)
Calcium, Ion: 1.27 mmol/L (ref 1.15–1.40)
HCT: 36 % (ref 36.0–46.0)
Hemoglobin: 12.2 g/dL (ref 12.0–15.0)
O2 Saturation: 99 %
Potassium: 3.5 mmol/L (ref 3.5–5.1)
Sodium: 143 mmol/L (ref 135–145)
TCO2: 20 mmol/L — ABNORMAL LOW (ref 22–32)
pCO2 arterial: 35.1 mmHg (ref 32–48)
pH, Arterial: 7.334 — ABNORMAL LOW (ref 7.35–7.45)
pO2, Arterial: 128 mmHg — ABNORMAL HIGH (ref 83–108)

## 2024-03-20 LAB — POCT ACTIVATED CLOTTING TIME: Activated Clotting Time: 256 s

## 2024-03-20 SURGERY — RIGHT/LEFT HEART CATH AND CORONARY ANGIOGRAPHY
Anesthesia: LOCAL

## 2024-03-20 MED ORDER — ADENOSINE 12 MG/4ML IV SOLN
INTRAVENOUS | Status: AC
  Filled 2024-03-20: qty 16

## 2024-03-20 MED ORDER — LABETALOL HCL 5 MG/ML IV SOLN: 10.0000 mg | INTRAVENOUS | Status: DC | PRN

## 2024-03-20 MED ORDER — VERAPAMIL HCL 2.5 MG/ML IV SOLN
INTRAVENOUS | Status: AC
  Filled 2024-03-20: qty 2

## 2024-03-20 MED ORDER — HEPARIN SODIUM (PORCINE) 1000 UNIT/ML IJ SOLN
INTRAMUSCULAR | Status: DC | PRN
  Administered 2024-03-20: 5000 [IU] via INTRAVENOUS
  Administered 2024-03-20: 1000 [IU] via INTRAVENOUS

## 2024-03-20 MED ORDER — SODIUM CHLORIDE 0.9 % IV SOLN: 250.0000 mL | INTRAVENOUS | Status: DC | PRN

## 2024-03-20 MED ORDER — ADENOSINE (DIAGNOSTIC) 140MCG/KG/MIN
INTRAVENOUS | Status: DC | PRN
  Administered 2024-03-20: 140 ug/kg/min via INTRAVENOUS

## 2024-03-20 MED ORDER — LIDOCAINE HCL (PF) 1 % IJ SOLN
INTRAMUSCULAR | Status: AC
Start: 2024-03-20 — End: 2024-03-20
  Filled 2024-03-20: qty 30

## 2024-03-20 MED ORDER — SODIUM CHLORIDE 0.9 % WEIGHT BASED INFUSION: 1.0000 mL/kg/h | INTRAVENOUS | Status: DC

## 2024-03-20 MED ORDER — FENTANYL CITRATE (PF) 100 MCG/2ML IJ SOLN
INTRAMUSCULAR | Status: AC
  Filled 2024-03-20: qty 2

## 2024-03-20 MED ORDER — ONDANSETRON HCL 4 MG/2ML IJ SOLN: 4.0000 mg | Freq: Four times a day (QID) | INTRAMUSCULAR | Status: DC | PRN

## 2024-03-20 MED ORDER — HEPARIN (PORCINE) IN NACL 2-0.9 UNITS/ML
INTRAMUSCULAR | Status: DC | PRN
  Administered 2024-03-20: 10 mL via INTRA_ARTERIAL

## 2024-03-20 MED ORDER — MIDAZOLAM HCL 2 MG/2ML IJ SOLN
INTRAMUSCULAR | Status: AC
Start: 2024-03-20 — End: 2024-03-20
  Filled 2024-03-20: qty 2

## 2024-03-20 MED ORDER — IOHEXOL 350 MG/ML SOLN
INTRAVENOUS | Status: DC | PRN
  Administered 2024-03-20: 50 mL via INTRA_ARTERIAL

## 2024-03-20 MED ORDER — SODIUM CHLORIDE 0.9% FLUSH: 3.0000 mL | INTRAVENOUS | Status: DC | PRN

## 2024-03-20 MED ORDER — LIDOCAINE HCL (PF) 1 % IJ SOLN
INTRAMUSCULAR | Status: DC | PRN
  Administered 2024-03-20 (×2): 2 mL

## 2024-03-20 MED ORDER — ASPIRIN 81 MG PO CHEW: 81.0000 mg | CHEWABLE_TABLET | ORAL | Status: DC

## 2024-03-20 MED ORDER — SODIUM CHLORIDE 0.9 % WEIGHT BASED INFUSION: 3.0000 mL/kg/h | INTRAVENOUS | Status: DC

## 2024-03-20 MED ORDER — MIDAZOLAM HCL 2 MG/2ML IJ SOLN
INTRAMUSCULAR | Status: DC | PRN
  Administered 2024-03-20 (×2): 1 mg via INTRAVENOUS

## 2024-03-20 MED ORDER — HYDRALAZINE HCL 20 MG/ML IJ SOLN: 10.0000 mg | INTRAMUSCULAR | Status: DC | PRN

## 2024-03-20 MED ORDER — HEPARIN (PORCINE) IN NACL 1000-0.9 UT/500ML-% IV SOLN
INTRAVENOUS | Status: DC | PRN
Start: 2024-03-20 — End: 2024-03-20
  Administered 2024-03-20 (×2): 500 mL

## 2024-03-20 MED ORDER — ACETAMINOPHEN 325 MG PO TABS: 650.0000 mg | ORAL_TABLET | ORAL | Status: DC | PRN

## 2024-03-20 MED ORDER — HEPARIN SODIUM (PORCINE) 1000 UNIT/ML IJ SOLN
INTRAMUSCULAR | Status: AC
  Filled 2024-03-20: qty 10

## 2024-03-20 MED ORDER — SODIUM CHLORIDE 0.9% FLUSH: 3.0000 mL | Freq: Two times a day (BID) | INTRAVENOUS | Status: DC

## 2024-03-20 MED ORDER — FENTANYL CITRATE (PF) 100 MCG/2ML IJ SOLN
INTRAMUSCULAR | Status: DC | PRN
  Administered 2024-03-20: 25 ug via INTRAVENOUS

## 2024-03-20 SURGICAL SUPPLY — 15 items
CATH BALLN WEDGE 5F 110CM (CATHETERS) IMPLANT
CATH INFINITI 5FR ANG PIGTAIL (CATHETERS) IMPLANT
CATH INFINITI AMBI 6FR TG (CATHETERS) IMPLANT
CATH LAUNCHER 6FR EBU3.5 (CATHETERS) IMPLANT
DEVICE RAD COMP TR BAND LRG (VASCULAR PRODUCTS) IMPLANT
GLIDESHEATH SLEND SS 6F .021 (SHEATH) IMPLANT
GUIDEWIRE PRESSURE X 175 (WIRE) IMPLANT
GUIDEWIRE TIGER .035X300 (WIRE) IMPLANT
KIT MICROPUNCTURE NIT STIFF (SHEATH) IMPLANT
PACK CARDIAC CATHETERIZATION (CUSTOM PROCEDURE TRAY) ×1 IMPLANT
SET ATX-X65L (MISCELLANEOUS) IMPLANT
SHEATH GLIDE SLENDER 4/5FR (SHEATH) IMPLANT
SHEATH PROBE COVER 6X72 (BAG) IMPLANT
WIRE EMERALD 3MM-J .035X260CM (WIRE) IMPLANT
WIRE MICRO SET SILHO 5FR 7 (SHEATH) IMPLANT

## 2024-03-20 NOTE — Discharge Instructions (Signed)
 Radial Site Care The following information offers guidance on how to care for yourself after your procedure. Your health care provider may also give you more specific instructions. If you have problems or questions, contact your health care provider. What can I expect after the procedure? After the procedure, it is common to have bruising and tenderness in the incision area. Follow these instructions at home: Incision site care  Follow instructions from your health care provider about how to take care of your incision site. Make sure you: Wash your hands with soap and water for at least 20 seconds before and after you change your bandage (dressing). If soap and water are not available, use hand sanitizer. Remove your dressing in 24 hours. Leave stitches (sutures), skin glue, or adhesive strips in place. These skin closures may need to stay in place for 2 weeks or longer. If adhesive strip edges start to loosen and curl up, you may trim the loose edges. Do not remove adhesive strips completely unless your health care provider tells you to do that. Do not take baths, swim, or use a hot tub for at least 1 week. You may shower 24 hours after the procedure or as told by your health care provider. Remove the dressing and gently wash the incision area with plain soap and water. Pat the area dry with a clean towel. Do not rub the site. That could cause bleeding. Do not apply powder or lotion to the site. Check your incision site every day for signs of infection. Check for: Redness, swelling, or pain. Fluid or blood. Warmth. Pus or a bad smell. Activity For 24 hours after the procedure, or as directed by your health care provider: Do not flex or bend the affected arm. Do not push or pull heavy objects with the affected arm. Do not operate machinery or power tools. Do not drive. You should not drive yourself home from the hospital or clinic if you go home during that time period. You may drive 24  hours after the procedure unless your health care provider tells you not to. Do not lift anything that is heavier than 10 lb (4.5 kg), or the limit that you are told, until your health care provider says that it is safe. Return to your normal activities as told by your health care provider. Ask your health care provider what activities are safe for you and when you can return to work. If you were given a sedative during the procedure, it can affect you for several hours. Do not drive or operate machinery until your health care provider says that it is safe. General instructions Take over-the-counter and prescription medicines only as told by your health care provider. If you will be going home right after the procedure, plan to have a responsible adult care for you for the time you are told. This is important. Keep all follow-up visits. This is important. Contact a health care provider if: You have a fever or chills. You have any of these signs of infection at your incision site: Redness, swelling, or pain. Fluid or blood. Warmth. Pus or a bad smell. Get help right away if: The incision area swells very fast. The incision area is bleeding, and the bleeding does not stop when you hold steady pressure on the area. Your arm or hand becomes pale, cool, tingly, or numb. These symptoms may represent a serious problem that is an emergency. Do not wait to see if the symptoms will go away. Get medical  help right away. Call your local emergency services (911 in the [place].S[place] Do not drive yourself to the hospital. Summary After the procedure, it is common to have bruising and tenderness at the incision site. Follow instructions from your health care provider about how to take care of your radial site incision. Check the incision every day for signs of infection. Do not lift anything that is heavier than 10 lb (4.5 kg), or the limit that you are told, until your health care provider says that it is  safe. Get help right away if the incision area swells very fast, you have bleeding at the incision site that will not stop, or your arm or hand becomes pale, cool, or numb. This information is not intended to replace advice given to you by your health care provider. Make sure you discuss any questions you have with your health care provider. Document Revised: 11/06/2020 Document Reviewed: 11/06/2020 Elsevier Patient Education  2024 [employer].

## 2024-03-20 NOTE — Interval H&P Note (Signed)
 History and Physical Interval Note:  03/20/2024 10:03 AM  Tanya [name]  has presented today for surgery, with the diagnosis of dyspnea.  The various methods of treatment have been discussed with the patient and family. After consideration of risks, benefits and other options for treatment, the patient has consented to  Procedure(s): RIGHT/LEFT HEART CATH AND CORONARY ANGIOGRAPHY (N/A) as a surgical intervention.  The patient's history has been reviewed, patient examined, no change in status, stable for surgery.  I have reviewed the patient's chart and labs.  Questions were answered to the patient's satisfaction.     Suki Crockett K Gila Lauf

## 2024-03-21 ENCOUNTER — Encounter (HOSPITAL_COMMUNITY): Payer: Self-pay | Admitting: Internal Medicine

## 2024-03-24 ENCOUNTER — Telehealth: Payer: Self-pay | Admitting: Family Medicine

## 2024-03-24 NOTE — Telephone Encounter (Signed)
 This rx was sent in to [facility] on 03/20/24 and confirmed by pharmacy

## 2024-03-24 NOTE — Telephone Encounter (Signed)
 Prescription Request  03/24/2024  LOV: 03/04/2024  What is the name of the medication or equipment? temazepam  (RESTORIL ) 15 MG capsule [[id number]]   Have you contacted your pharmacy to request a refill? Yes   Which pharmacy would you like this sent to?  [facility] [place]   Patient notified that their request is being sent to the clinical staff for review and that they should receive a response within 2 business days.   Please advise at patient spouse walked in

## 2024-03-28 ENCOUNTER — Encounter (HOSPITAL_COMMUNITY): Payer: Self-pay | Admitting: Interventional Radiology

## 2024-04-10 ENCOUNTER — Ambulatory Visit: Attending: Student | Admitting: Student

## 2024-04-10 NOTE — Progress Notes (Deleted)
 Cardiology Office Note    Date:  04/10/2024  ID:  Tanya [name], DOB January 06, 1951, MRN [id number] Cardiologist: Alvan Carrier, MD    History of Present Illness:    Tanya [name] is a 73 y.o. 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  She was last examined by Dr. Alvan in 03/2024 and reported worsening dyspnea on exertion with walking from room to room at home and denied any associated chest pain or palpitations.  Did note occasional lower extremity edema.  Definitive valuation with a R/LHC was recommended for further assessment.  This was performed by Dr.Thukkani on 03/20/2024 and showed widely patent stents along her LAD with only 10 to 20% ISR.  Cardiac output was at 6.7 L/min and wedge pressure was at 14 mmHg with LVEDP elevated at 23 mmHg.  There was no evidence of coronary microvascular dysfunction with CFR at 4 and IMR at 11.  - diuretic? - Pulm  ROS: ***  Studies Reviewed:   EKG: EKG is*** ordered today and demonstrates ***   EKG Interpretation Date/Time:    Ventricular Rate:    PR Interval:    QRS Duration:    QT Interval:    QTC Calculation:   R Axis:      Text Interpretation:         Echocardiogram: 07/2023 IMPRESSIONS     1. Left ventricular ejection fraction, by estimation, is 65 to 70%. The  left ventricle has normal function. The left ventricle has no regional  wall motion abnormalities. There is mild left ventricular hypertrophy.  Left ventricular diastolic parameters  are consistent with Grade I diastolic dysfunction (impaired relaxation).  The average left ventricular global longitudinal strain is -23.3 %. The  global longitudinal strain is normal.    2. Right ventricular systolic function is normal. The right ventricular  size is normal. Tricuspid regurgitation signal is inadequate for assessing  PA pressure.   3. The mitral valve is normal in structure. Mild mitral valve  regurgitation. No evidence of mitral stenosis.   4. The aortic valve is tricuspid. Aortic valve regurgitation is not  visualized. No aortic stenosis is present.   Comparison(s): EF 60-65%. Mild mitral regurgitation.   R/LHC: 03/2024   Prox LAD lesion is 20% stenosed.   Mid LAD lesion is 10% stenosed.   1.  Widely patent proximal LAD stents with no high-grade obstructions elsewhere. 2.  Fick cardiac output of 6.7 L/min and Fick cardiac index of 4.2 L/min/m with the following hemodynamics:            Right atrial pressure mean of 8 mmHg            Right ventricular pressure 32/7 with a with an end-diastolic pressure of 13 mmHg            Wedge pressure mean of 14 mmHg with V waves to 17 mmHg            PA pressure 33/14 with a mean of 23 mmHg            PVR of 1.3 Woods units            PA pulsatility index of 2.4 3.  LVEDP of 23 mmHg 4.  No evidence of coronary microvascular dysfunction with CFR of 4 and IMR of 11.   Recommendation: Medical therapy   Risk Assessment/Calculations:   {Does this patient have ATRIAL FIBRILLATION?[phone]} No BP recorded.  {Refresh Note OR Click here to enter BP  :1}***         Physical Exam:   VS:  There were no vitals taken for this visit.   Wt Readings from Last 3 Encounters:  03/20/24 130 lb (59 kg)  03/10/24 135 lb 6.4 oz (61.4 kg)  03/04/24 134 lb 1.9 oz (60.8 kg)     GEN: Well nourished, well developed in no acute distress NECK: No JVD; No carotid bruits CARDIAC: ***RRR, no murmurs, rubs, gallops RESPIRATORY:  Clear to auscultation without rales, wheezing or rhonchi  ABDOMEN: Appears non-distended. No obvious abdominal masses. EXTREMITIES: No clubbing or cyanosis. No edema.  Distal pedal pulses are 2+  bilaterally.   Assessment and Plan:   1. Dyspnea on exertion ***  2. Coronary artery disease involving native heart without angina pectoris, unspecified vessel or lesion type - She previously underwent DES x 2 to the proximal LAD in 2019 and recent cardiac catheterization last month showed patent LAD stents with no obstructive CAD elsewhere.  3. Bilateral carotid artery disease, unspecified type (HCC) - Carotid dopplers in 01/2023 showed plaque along the carotid arteries with less than 50% stenosis bilaterally. Continue ASA 81 mg daily and Crestor  40 mg daily.  4. Essential hypertension - BP is at *** during today's visit.  Continue current medical therapy with amlodipine  10 mg daily, Clonidine  0.3 mg at bedtime and Toprol -XL 12.5 mg daily.  5. Hyperlipidemia LDL goal <70 - LDL was at 41 when checked in 03/2024.  Continue current medical therapy with Crestor  40 mg daily.   Signed, Laymon CHRISTELLA Qua, PA-C

## 2024-04-16 ENCOUNTER — Other Ambulatory Visit: Payer: Self-pay | Admitting: Family Medicine

## 2024-04-16 DIAGNOSIS — I10 Essential (primary) hypertension: Secondary | ICD-10-CM

## 2024-04-20 ENCOUNTER — Telehealth: Payer: Self-pay | Admitting: Family Medicine

## 2024-04-20 NOTE — Telephone Encounter (Signed)
 Patient needing a refill on amLODipine  (NORVASC ) 5 MG tablet [[id number]] sent to [facility] in [place] says she wants 90 supply and to call her with any questions. Thank you

## 2024-04-21 ENCOUNTER — Ambulatory Visit: Payer: Self-pay | Admitting: Cardiology

## 2024-04-21 NOTE — Telephone Encounter (Signed)
 Pt husband informed

## 2024-04-22 ENCOUNTER — Telehealth: Payer: Self-pay | Admitting: Cardiology

## 2024-04-22 MED ORDER — AMLODIPINE BESYLATE 5 MG PO TABS: 5.0000 mg | ORAL_TABLET | Freq: Every day | ORAL | 3 refills | Status: DC

## 2024-04-22 NOTE — Telephone Encounter (Signed)
*  STAT* If patient is at the pharmacy, call can be transferred to refill team.   1. Which medications need to be refilled? (please list name of each medication and dose if known) amLODipine  (NORVASC ) 5 MG tablet     4. Which pharmacy/location (including street and city if local pharmacy) is medication to be sent to? [id number] - [place], [place] - [address]     5. Do they need a 30 day or 90 day supply? 90  Pt spouse states she is completely out

## 2024-04-22 NOTE — Telephone Encounter (Signed)
 Refills has been sent to the pharmacy.

## 2024-04-23 ENCOUNTER — Telehealth: Payer: Self-pay

## 2024-04-23 DIAGNOSIS — H4323 Crystalline deposits in vitreous body, bilateral: Secondary | ICD-10-CM | POA: Diagnosis not present

## 2024-04-23 DIAGNOSIS — Z961 Presence of intraocular lens: Secondary | ICD-10-CM | POA: Diagnosis not present

## 2024-04-23 DIAGNOSIS — H2512 Age-related nuclear cataract, left eye: Secondary | ICD-10-CM | POA: Diagnosis not present

## 2024-04-23 NOTE — Telephone Encounter (Signed)
 Short term disability from [employer]  Form in provider box Copy at front desk

## 2024-04-24 ENCOUNTER — Telehealth: Payer: Self-pay | Admitting: Family Medicine

## 2024-04-24 NOTE — Telephone Encounter (Signed)
 Currently in process to be given to provider

## 2024-04-24 NOTE — Telephone Encounter (Signed)
 Disability paperwork from [place] is returned to your area.  As far as I am aware this patient is employed full-time.  I do not have information to complete the form and asked that you call and get some information from her.  I have not taken her out of work  Than k you

## 2024-04-28 NOTE — Telephone Encounter (Signed)
 Pt husband informed, he states he will have her discuss it at her visit this week

## 2024-04-30 ENCOUNTER — Ambulatory Visit (INDEPENDENT_AMBULATORY_CARE_PROVIDER_SITE_OTHER): Admitting: Family Medicine

## 2024-04-30 ENCOUNTER — Encounter: Payer: Self-pay | Admitting: Family Medicine

## 2024-04-30 VITALS — BP 127/61 | HR 56 | Resp 16 | Ht 64.0 in | Wt 135.1 lb

## 2024-04-30 DIAGNOSIS — E7849 Other hyperlipidemia: Secondary | ICD-10-CM

## 2024-04-30 DIAGNOSIS — F5104 Psychophysiologic insomnia: Secondary | ICD-10-CM | POA: Diagnosis not present

## 2024-04-30 DIAGNOSIS — I10 Essential (primary) hypertension: Secondary | ICD-10-CM | POA: Diagnosis not present

## 2024-04-30 MED ORDER — AMLODIPINE BESYLATE 10 MG PO TABS: 10.0000 mg | ORAL_TABLET | Freq: Every day | ORAL | 3 refills | Status: DC

## 2024-04-30 NOTE — Assessment & Plan Note (Signed)
 Controlled, no change in medication DASH diet and commitment to daily physical activity for a minimum of 30 minutes discussed and encouraged, as a part of hypertension management. The importance of attaining a healthy weight is also discussed.     04/30/2024   10:43 AM 03/20/2024    3:00 PM 03/20/2024    2:30 PM 03/20/2024    2:15 PM 03/20/2024    2:00 PM 03/20/2024    1:45 PM 03/20/2024    1:30 PM  BP/Weight  Systolic BP 127 132 127 136 138 138 140  Diastolic BP 61 56 78 55 64 85 63  Wt. (Lbs) 135.08        BMI 23.19 kg/m2

## 2024-04-30 NOTE — Progress Notes (Signed)
   Tanya [name]     MRN: [id number]      DOB: 05-09-1951  Chief Complaint  Patient presents with   Medical Management of Chronic Issues    8 week follow up. Has disability papers, unable to thoroughly explain why she is needing them, it is from when she was admitted to hospital in june    HPI Tanya [name] is here for follow up and re-evaluation of chronic medical conditions, medication management in particular dose of her amlodipine  tablet, reports taking 10 mg daily and has brought in bottles, explaining most recent rx was 5 mg but this needs too be corrected Crying states spouse walked out approx 3 weeks ago and will not return Only goes from work to home, thankful she hasa job, does not need disability paperwork completed and I have put them in the trash.  Cardiac cath report reviewed and is excellent ROS Denies recent fever or chills. Denies sinus pressure, nasal congestion, ear pain or sore throat. Denies chest congestion, productive cough or wheezing. Denies chest pains, palpitations and leg swelling Denies abdominal pain, nausea, vomiting,diarrhea or constipation.   Denies dysuria, frequency, hesitancy or incontinence. Denies joint pain, swelling and limitation in mobility. Denies headaches, seizures, numbness, or tingling. Anxious and tearful due to separation . Denies skin break down or rash.   PE  BP 127/61   Pulse (!) 56   Resp 16   Ht 5' 4 (1.626 m)   Wt 135 lb 1.3 oz (61.3 kg)   SpO2 98%   BMI 23.19 kg/m   Patient alert and oriented and in no cardiopulmonary distress.  HEENT: No facial asymmetry, EOMI,     Neck supple .  Chest: Clear to auscultation bilaterally.  CVS: S1, S2 no murmurs, no S3.Regular rate.  ABD: Soft non tender.   Ext: No edema  MS: Adequate ROM spine, shoulders, hips and knees.  Skin: Intact, no ulcerations or rash noted.  Psych: Good eye contact, normal affect. Memory intact , cryingCNS: CN 2-12 intact, power,  normal  throughout.no focal deficits noted.   Assessment & Plan  Essential hypertension Controlled, no change in medication DASH diet and commitment to daily physical activity for a minimum of 30 minutes discussed and encouraged, as a part of hypertension management. The importance of attaining a healthy weight is also discussed.     04/30/2024   10:43 AM 03/20/2024    3:00 PM 03/20/2024    2:30 PM 03/20/2024    2:15 PM 03/20/2024    2:00 PM 03/20/2024    1:45 PM 03/20/2024    1:30 PM  BP/Weight  Systolic BP 127 132 127 136 138 138 140  Diastolic BP 61 56 78 55 64 85 63  Wt. (Lbs) 135.08        BMI 23.19 kg/m2             Hyperlipemia Hyperlipidemia:Low fat diet discussed and encouraged.   Lipid Panel  Lab Results  Component Value Date   CHOL 124 03/04/2024   HDL 70 03/04/2024   LDLCALC 41 03/04/2024   TRIG 57 03/04/2024   CHOLHDL 1.8 03/04/2024     Updated lab needed at/ before next visit.   Insomnia Controlled, no change in medication

## 2024-04-30 NOTE — Patient Instructions (Addendum)
 F/U in 4 months  Current and correct dose of amlodipine  is 10 mg ONE TABLET ONCE  daily. I have sent this in   OK to take TWO 5mg  amlodipine  tablets every day as you have been doing, but PLEASE fill as soon as you can the amlodipine  10 mg tablet and take one daily ( Nurse pls speak with pharmacist and also explain the situation, thanks)  Fasting lipid, cmp and EGFr  3 to 7 days before next visit   Thanks for choosing [facility], we consider it a privelige to serve you.

## 2024-04-30 NOTE — Assessment & Plan Note (Signed)
 Hyperlipidemia:Low fat diet discussed and encouraged.   Lipid Panel  Lab Results  Component Value Date   CHOL 124 03/04/2024   HDL 70 03/04/2024   LDLCALC 41 03/04/2024   TRIG 57 03/04/2024   CHOLHDL 1.8 03/04/2024     Updated lab needed at/ before next visit.

## 2024-04-30 NOTE — Assessment & Plan Note (Signed)
 Controlled, no change in medication

## 2024-05-09 NOTE — Progress Notes (Addendum)
 Tanya [name], female    DOB: 1950-12-31,   MRN: [id number]  Brief patient profile:  73 yo [place] never smoker in [place]  since 1980 and new onset doe x 2015  and progressed since to point where can't even even walk 50 ft sometimes    History of Present Illness  07/05/2020  Pulmonary/ 1st office eval/ Jye Fariss / [place] [facility]  - sob 50 ft to office on day of 1st eval  Chief Complaint  Patient presents with   Pulmonary Consult    Referred by Laymon Qua, PA.  Pt c/o SOB x 2 years. She states she was winded walking from lobby to nearest exam room today.   Dyspnea:  Sometimes a  Little sob at rest and worse when walk anywhere s assoc cp or sweating / nausea  Uses treadmill once or twice a week using x 30 min s stopping at slt tilt (can't pace higher due to headache)  Cough: sometimes but not often and no assoc with sob Sleep: ok flat/ one pillow  SABA use: none  rec To get the most out of exercise, you need to be continuously aware that you are short of breath, but never out of breath, for 30 minutes daily.     If not improving what you will need is a cpst - please call in 6 weeks to schedule if not impoving.  Late ADD:   Chart review suggests also dx of possible gerd/lpr so rec give diet: GERD diet   And be sure to take protonix  40 Take 30-60 min before first meal of the day including the day of the cpst.   09/28/2020  f/u ov/[place] [facility]/Macio Kissoon re: unexplained sob/ fleeting cp and really bad hb not really sure what she takes for it. Chief Complaint  Patient presents with   Follow-up    Shortness of breath with exertion  Dyspnea:  No change Cough: none  Sleeping: not disturbing sleep  SABA use: none  02: none  CP on R ant comes and goes  when is the last time you had it  ?   A  Can't remember  How long does it last maybe a second  Rec Be sure you are taking Pantoprazole  (protonix ) 40 mg   Take  30-60 min before first meal of the day and add  Pepcid   (famotidine )  20 mg one after supper  until return to office - this is the best way to tell whether stomach acid is contributing to your problem. GERD  diet  If still having problems with breathing  after you finish your stomach problem evaluation then I will need to see you back with all your medications in hand to order to consider what the next step should be.     04/06/2022 Re-establish  ov/[place] [facility]/Kweli Grassel re: unexplained sob/ rest and ex  maint on ppi/ not on asthma meds  Chief Complaint  Patient presents with   Follow-up    SOB is getting worse.   Dyspnea:  walking across the parking lot  Cough: variable, dry  daytime only  Sleeping: no resp cc / bed flat one pillow SABA use: none  02: none  Gives variable answers to all questions except that she never has chest/ resp cc supine  Subxiphoid pain comes and goes for years, sometimes at rest, sometimes with exertion  Rec - Allergy screen 04/06/2022 >  Eos 0.2 /  IgE  564 >>> allergy referral > refused  - f/u prn  03/20/24  RHC/ LHC ok x LVEDP  24    05/11/2024  f/u ov/[place] office/Amelita Risinger re: unexplained sob/ rest and ex   Chief Complaint  Patient presents with   Shortness of Breath   Dyspnea:  not limiting her most of the time with desired activities  Cough: none  Sleeping: flat bed / wedge pillow s    resp cc  SABA use: not helpful 02: none   No obvious day to day or daytime variability or assoc excess/ purulent sputum or mucus plugs or hemoptysis or cp or chest tightness, subjective wheeze or overt sinus or hb symptoms.    Also denies any obvious fluctuation of symptoms with weather or environmental changes or other aggravating or alleviating factors except as outlined above   No unusual exposure hx or h/o childhood pna/ asthma or knowledge of premature birth.  Current Allergies, Complete Past Medical History, Past Surgical History, Family History, and Social History were reviewed in [employer]  record.  ROS  The following are not active complaints unless bolded Hoarseness, sore throat, dysphagia, dental problems, itching, sneezing,  nasal congestion or discharge of excess mucus or purulent secretions, ear ache,   fever, chills, sweats, unintended wt loss or wt gain, classically pleuritic or exertional cp,  orthopnea pnd or arm/hand swelling  or leg swelling, presyncope, palpitations, abdominal pain, anorexia, nausea, vomiting, diarrhea  or change in bowel habits or change in bladder habits, change in stools or change in urine, dysuria, hematuria,  rash, arthralgias, visual complaints, headache, numbness, weakness or ataxia or problems with walking or coordination,  change in mood or  memory.        Current Meds  Medication Sig   acetaminophen  (TYLENOL ) 500 MG tablet Take 1,000 mg by mouth every 6 (six) hours as needed for moderate pain.   albuterol  (VENTOLIN  HFA) 108 (90 Base) MCG/ACT inhaler Inhale 2 puffs into the lungs every 6 (six) hours as needed for wheezing or shortness of breath.   amLODipine  (NORVASC ) 10 MG tablet Take 1 tablet (10 mg total) by mouth daily.   Ascorbic Acid  (VITAMIN C ) 1000 MG tablet Take 1,000 mg by mouth 2 (two) times a week.   aspirin  EC 81 MG tablet Take 81 mg by mouth at bedtime. Swallow whole.   carboxymethylcellulose (REFRESH TEARS) 0.5 % SOLN Place 1 drop into both eyes 2 (two) times daily.   cholecalciferol (VITAMIN D3) 25 MCG (1000 UNIT) tablet Take 1,000 Units by mouth 2 (two) times a week.   CINNAMON PO Take 2 mLs by mouth daily.   cloNIDine  (CATAPRES ) 0.3 MG tablet Take 1 tablet (0.3 mg total) by mouth at bedtime.   clopidogrel  (PLAVIX ) 75 MG tablet Take 75 mg by mouth daily.   cyanocobalamin  (VITAMIN B12) 1000 MCG tablet Take 3,000 mcg by mouth daily. Gummies   famotidine  (PEPCID ) 20 MG tablet One after supper   metoprolol  succinate (TOPROL -XL) 25 MG 24 hr tablet TAKE ONE-HALF TABLET BY MOUTH  DAILY   nitroGLYCERIN  (NITROSTAT ) 0.4 MG SL tablet  Place 1 tablet (0.4 mg total) under the tongue every 5 (five) minutes as needed.   Nutritional Supplements (FRUIT & VEGETABLE DAILY PO) Take 6 tablets by mouth daily.   omeprazole  (PRILOSEC) 40 MG capsule TAKE 1 CAPSULE BY MOUTH DAILY   PARoxetine  (PAXIL ) 10 MG tablet TAKE 1 TABLET BY MOUTH ONCE  DAILY   Potassium Chloride  ER 20 MEQ TBCR TAKE 1 TABLET BY MOUTH DAILY   rosuvastatin  (CRESTOR ) 40 MG tablet  TAKE 1 TABLET BY MOUTH DAILY   temazepam  (RESTORIL ) 15 MG capsule Take 1 capsule (15 mg total) by mouth at bedtime.   topiramate  (TOPAMAX ) 25 MG tablet Take 1 tablet (25 mg total) by mouth at bedtime.   Turmeric, Curcuma Longa, POWD Take 15 mLs by mouth daily.   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 s/p DESx2 to proximal LAD in 06/2018 b. cath in 04/2019 showing 40% mid-LAD ISR with no significant stenosis along LM, LCx, or RCA   Cystitis    Elevated liver enzymes    Fatty liver    GERD (gastroesophageal reflux disease)    Headache    migraine   Hyperlipidemia    Hypertension    Insomnia    Unspecified hypothyroidism    Vaginitis    Wears glasses    Wears hearing aid    B/L        Objective:    Wts  05/11/2024        134  04/06/2022         141   09/28/20 138 lb (62.6 kg)  07/22/20 136 lb 12.8 oz (62.1 kg)  07/21/20 135 lb (61.2 kg)    Vital signs reviewed  05/11/2024  - Note at rest 02 sats  97% on RA   General appearance:    somber philipino female/ very poor hearing - has hearing aides doesn't wear them not helping   / has ENT but wants second opinion     HEENT : Oropharynx  clear     Nasal turbinates nl / ear canals clear    NECK :  without  apparent JVD/ palpable Nodes/TM / minimal pseudowheeze    LUNGS: no acc muscle use,  Nl contour chest which is clear to A and  P bilaterally without cough on insp or exp maneuvers   CV:  RRR  no s3 or murmur or increase in P2, and no edema   ABD:  soft and nontender   MS:  Gait nl   ext warm without deformities Or obvious joint restrictions  calf tenderness, cyanosis or clubbing    SKIN: warm and dry without lesions    NEURO:  alert, approp, nl sensorium with  no motor or cerebellar deficits apparent.      Assessment       Assessment & Plan Dyspnea on exertion Onset 2015 with variable CP  - PFT's  09/22/14   FEV1 2.17 (92 % ) ratio 0.83  p 9 % improvement from saba p 0 prior to study with DLCO  12.40 (54%) corrects to 3.95 (84%)  for alv volume and FV curve nl exp/ severe truncation insp loop   - LHC  04/24/2019  40% eccentric mid LAD in-stent restenosis.  At completion of stent procedure in September 2020, there was residual stent deformity in this region.  There is no high-grade obstruction in the LAD stent.  The LAD is otherwise normal. Normal left main Normal circumflex Normal RCA Normal LV systolic function and end-diastolic pressure - Echo 06/29/2019 nl x for   diastolic dysfunction  -  07/05/2020   Walked RA  approx  500 ft  @ very fast pace  stopped due to  End of study , min  sob ,  sats still 96%     - 04/06/2022   Walked on RA   x  3   lap(s) =  approx 450  ft  @ very fast  pace, stopped due to end of study  with lowest 02 sats 98% but did c/o upper abd discomfort ? Worse with ex   - Allergy screen 04/06/2022 >  Eos 0.2 /  IgE  564 >>> allergy referral > declined -03/20/24  RHC/ LHC ok x LVEDP  24    - 05/11/2024   Walked on RA  x  3  lap(s) =  approx 450  ft  @ fast pace, stopped due to end of study  with lowest 02 sats 99% s sob   - 05/11/2024 max gerd rx (no more mint) and ENT eval per [facility] for hearing and globus sensation   Each maintenance medication was reviewed in detail including emphasizing most importantly the difference between maintenance and prns and under what circumstances the prns  are to be triggered using an action plan format where appropriate.  Total time for H and P, chart review, counseling,  directly observing portions of ambulatory 02 saturation study/ and generating customized AVS unique to this office visit / same day charting = 32 final summary f/u ov >   for multiple chronic  refractory respiratory  symptoms of uncertain etiology                    Patient Instructions  Omeprazole   40 mg   Take  30-60 min before first meal of the day and Pepcid  (famotidine )  20 mg after supper until return to office - this is the best way to tell whether stomach acid is contributing to your problem.    GERD (REFLUX)  is an extremely common cause of respiratory symptoms just like yours , many times with no obvious heartburn at all.    It can be treated with medication, but also with lifestyle changes including elevation of the head of your bed (ideally with 6 -8inch blocks under the headboard of your bed),  Smoking cessation, avoidance of late meals, excessive alcohol , and avoid fatty foods, chocolate, peppermint, colas, red wine, and acidic juices such as orange juice.  NO MINT OR MENTHOL or Chocolate  PRODUCTS SO -  COUGH DROPS  USE SUGARLESS CANDY INSTEAD (Jolley ranchers or Stover's or Life Savers) or even ice chips will also do - the key is to swallow to prevent all throat clearing. NO OIL BASED VITAMINS - use powdered substitutes.  Avoid fish oil when coughing.    My office will be contacting you by phone for referral to [facility] for your hearing and throat problems   - if you don't hear back from my office within one week please call us  back or notify us  thru MyChart and we'll address it right away.   Pulmonary follow up is as needed       Ozell America, MD 05/12/2024

## 2024-05-11 ENCOUNTER — Ambulatory Visit (INDEPENDENT_AMBULATORY_CARE_PROVIDER_SITE_OTHER): Admitting: Internal Medicine

## 2024-05-11 ENCOUNTER — Encounter: Payer: Self-pay | Admitting: Internal Medicine

## 2024-05-11 VITALS — BP 145/69 | HR 54 | Temp 96.8°F | Ht 64.0 in | Wt 134.6 lb

## 2024-05-11 DIAGNOSIS — R0609 Other forms of dyspnea: Secondary | ICD-10-CM | POA: Diagnosis not present

## 2024-05-11 MED ORDER — FAMOTIDINE 20 MG PO TABS: ORAL_TABLET | ORAL | 11 refills | Status: AC

## 2024-05-11 NOTE — Patient Instructions (Addendum)
 Omeprazole   40 mg   Take  30-60 min before first meal of the day and Pepcid  (famotidine )  20 mg after supper until return to office - this is the best way to tell whether stomach acid is contributing to your problem.    GERD (REFLUX)  is an extremely common cause of respiratory symptoms just like yours , many times with no obvious heartburn at all.    It can be treated with medication, but also with lifestyle changes including elevation of the head of your bed (ideally with 6 -8inch blocks under the headboard of your bed),  Smoking cessation, avoidance of late meals, excessive alcohol , and avoid fatty foods, chocolate, peppermint, colas, red wine, and acidic juices such as orange juice.  NO MINT OR MENTHOL or Chocolate  PRODUCTS SO -  COUGH DROPS  USE SUGARLESS CANDY INSTEAD (Jolley ranchers or Stover's or Life Savers) or even ice chips will also do - the key is to swallow to prevent all throat clearing. NO OIL BASED VITAMINS - use powdered substitutes.  Avoid fish oil when coughing.    My office will be contacting you by phone for referral to [facility] for your hearing and throat problems   - if you don't hear back from my office within one week please call us  back or notify us  thru MyChart and we'll address it right away.   Pulmonary follow up is as needed

## 2024-05-12 ENCOUNTER — Other Ambulatory Visit: Payer: Self-pay

## 2024-05-12 ENCOUNTER — Telehealth: Payer: Self-pay | Admitting: Family Medicine

## 2024-05-12 DIAGNOSIS — H2512 Age-related nuclear cataract, left eye: Secondary | ICD-10-CM | POA: Diagnosis not present

## 2024-05-12 MED ORDER — CLONIDINE HCL 0.3 MG PO TABS: 0.3000 mg | ORAL_TABLET | Freq: Every day | ORAL | 1 refills | Status: AC

## 2024-05-12 NOTE — Telephone Encounter (Signed)
 Refills sent to pharmacy.

## 2024-05-12 NOTE — Telephone Encounter (Signed)
 Prescription Request  05/12/2024  LOV: 04/30/2024  What is the name of the medication or equipment? cloNIDine  (CATAPRES ) 0.3 MG tablet [[id number]]   Have you contacted your pharmacy to request a refill? Yes   Which pharmacy would you like this sent to?   [facility]   Patient notified that their request is being sent to the clinical staff for review and that they should receive a response within 2 business days.   Please advise at spouse walked into office

## 2024-05-12 NOTE — Assessment & Plan Note (Addendum)
 Onset 2015 with variable CP  - PFT's  09/22/14   FEV1 2.17 (92 % ) ratio 0.83  p 9 % improvement from saba p 0 prior to study with DLCO  12.40 (54%) corrects to 3.95 (84%)  for alv volume and FV curve nl exp/ severe truncation insp loop   - LHC  04/24/2019  40% eccentric mid LAD in-stent restenosis.  At completion of stent procedure in September 2020, there was residual stent deformity in this region.  There is no high-grade obstruction in the LAD stent.  The LAD is otherwise normal. Normal left main Normal circumflex Normal RCA Normal LV systolic function and end-diastolic pressure - Echo 06/29/2019 nl x for   diastolic dysfunction  -  07/05/2020   Walked RA  approx   500 ft  @ very fast pace  stopped due to  End of study , min  sob ,  sats still 96%     - 04/06/2022   Walked on RA   x  3   lap(s) =  approx 450  ft  @ very fast  pace, stopped due to end of study  with lowest 02 sats 98% but did c/o upper abd discomfort ? Worse with ex   - Allergy screen 04/06/2022 >  Eos 0.2 /  IgE  564 >>> allergy referral > declined -03/20/24  RHC/ LHC ok x LVEDP  24    - 05/11/2024   Walked on RA  x  3  lap(s) =  approx 450  ft  @ fast pace, stopped due to end of study  with lowest 02 sats 99% s sob   - 05/11/2024 max gerd rx (no more mint) and ENT eval per [facility] for hearing and globus sensation   Each maintenance medication was reviewed in detail including emphasizing most importantly the difference between maintenance and prns and under what circumstances the prns are to be triggered using an action plan format where appropriate.  Total time for H and P, chart review, counseling,  directly observing portions of ambulatory 02 saturation study/ and generating customized AVS unique to this office visit / same day charting = 32 final summary f/u ov >   for multiple chronic  refractory respiratory  symptoms of uncertain etiology

## 2024-06-08 ENCOUNTER — Other Ambulatory Visit: Payer: Self-pay | Admitting: Family Medicine

## 2024-06-12 ENCOUNTER — Telehealth: Payer: Self-pay | Admitting: Family Medicine

## 2024-06-12 NOTE — Telephone Encounter (Signed)
 Patient husband here says [facility] has told them they opted out which he did not mean to do and he is not tech savvy enough to reach out to them. Wants to know if we can do it on our end. Please advise Thank you

## 2024-06-15 ENCOUNTER — Other Ambulatory Visit: Payer: Self-pay

## 2024-06-15 DIAGNOSIS — G43019 Migraine without aura, intractable, without status migrainosus: Secondary | ICD-10-CM

## 2024-06-15 MED ORDER — TOPIRAMATE 25 MG PO TABS: 25.0000 mg | ORAL_TABLET | Freq: Every day | ORAL | 3 refills | Status: DC

## 2024-06-16 NOTE — Telephone Encounter (Signed)
 Patient advised to contact pharmacy.

## 2024-07-06 NOTE — Progress Notes (Unsigned)
 Cardiology Office Note    Date:  07/08/2024  ID:  Cozy, Veale [birth year], MRN [id number] Cardiologist: Alvan Carrier, MD { :  History of Present Illness:    Tanya [name] is a 73 y.o. female with past medical history of CAD (s/p DESx2 to proximal LAD in 06/2018 with cath in 04/2019 showing 40% mid-LAD ISR with no significant stenosis along LM, LCx, or RCA), carotid artery disease, dyspnea on exertion (air-trapping and decreased DLCO by prior PFT's), HTN, HLD, and GERD who presents to the office today for follow-up.    She was last examined by Dr. Alvan in 03/2024 and reported worsening dyspnea on exertion with walking from room to room at home and denied any associated chest pain or palpitations. Did note occasional lower extremity edema. Definitive evaluation with a R/LHC was recommended for further assessment. This was performed by Dr. Wendel on 03/20/2024 and showed widely patent stents along her LAD with only 10 to 20% ISR. Cardiac output was at 6.7 L/min and wedge pressure was at 14 mmHg with LVEDP elevated at 23 mmHg. There was no evidence of coronary microvascular dysfunction with CFR at 4 and IMR at 11.  In talking the patient today, she reports having chronic shortness of breath with exertion which has been occurring for the past few years. Was evaluated by Pulmonology as well and was referred to ENT with her consultation being tomorrow. She denies any exertional chest pain, orthopnea, PND or pitting edema. Does report having occasional lower extremity swelling after working for a full shift and was recently prescribed compression stockings to utilize. She also notes occasional palpitations which only last for a few seconds and spontaneously resolve. Consumes 1 cup of coffee a day with no other caffeine  intake and she denies any alcohol  use.  Studies Reviewed:   EKG: EKG is not ordered today.  Echocardiogram: 07/2023 IMPRESSIONS     1. Left ventricular ejection  fraction, by estimation, is 65 to 70%. The  left ventricle has normal function. The left ventricle has no regional  wall motion abnormalities. There is mild left ventricular hypertrophy.  Left ventricular diastolic parameters  are consistent with Grade I diastolic dysfunction (impaired relaxation).  The average left ventricular global longitudinal strain is -23.3 %. The  global longitudinal strain is normal.   2. Right ventricular systolic function is normal. The right ventricular  size is normal. Tricuspid regurgitation signal is inadequate for assessing  PA pressure.   3. The mitral valve is normal in structure. Mild mitral valve  regurgitation. No evidence of mitral stenosis.   4. The aortic valve is tricuspid. Aortic valve regurgitation is not  visualized. No aortic stenosis is present.   Comparison(s): EF 60-65%. Mild mitral regurgitation.    R/LHC: 03/2024   Prox LAD lesion is 20% stenosed.   Mid LAD lesion is 10% stenosed.   1.  Widely patent proximal LAD stents with no high-grade obstructions elsewhere. 2.  Fick cardiac output of 6.7 L/min and Fick cardiac index of 4.2 L/min/m with the following hemodynamics:            Right atrial pressure mean of 8 mmHg            Right ventricular pressure 32/7 with a with an end-diastolic pressure of 13 mmHg            Wedge pressure mean of 14 mmHg with V waves to 17 mmHg  PA pressure 33/14 with a mean of 23 mmHg            PVR of 1.3 Woods units            PA pulsatility index of 2.4 3.  LVEDP of 23 mmHg 4.  No evidence of coronary microvascular dysfunction with CFR of 4 and IMR of 11.   Recommendation: Medical therapy  Physical Exam:   VS:  BP 130/62   Pulse (!) 58   Ht 5' 4 (1.626 m)   Wt 136 lb 6.4 oz (61.9 kg)   SpO2 98%   BMI 23.41 kg/m    Wt Readings from Last 3 Encounters:  07/08/24 136 lb 6.4 oz (61.9 kg)  05/11/24 134 lb 9.6 oz (61.1 kg)  04/30/24 135 lb 1.3 oz (61.3 kg)     GEN: Well nourished,  well developed female appearing in no acute distress NECK: No JVD; No carotid bruits CARDIAC: RRR, no murmurs, rubs, gallops RESPIRATORY:  Clear to auscultation without rales, wheezing or rhonchi  ABDOMEN: Appears non-distended. No obvious abdominal masses. EXTREMITIES: No clubbing or cyanosis. No pitting edema.  Distal pedal pulses are 2+ bilaterally.   Assessment and Plan:   1. Dyspnea on exertion - This continues to be an issue for the patient and has been occurring for several years with no acute changes. Recent cardiac catheterization showed patent proximal LAD stents as outlined above and no evidence of microvascular dysfunction. LVEDP was elevated at 23 mmHg but she appears euvolemic by examination today. Of note, her CTA in 07/2023 showed right middle lobe bronchiectasis with mucous plugging and nodules noted. Encouraged her to keep scheduled follow-up with Pulmonology and ENT for evaluation of other etiologies.   2. Coronary artery disease involving native heart without angina pectoris, unspecified vessel or lesion type - She previously underwent DES x 2 to the proximal LAD in 2019 and recent cardiac catheterization in 03/2024  showed patent LAD stents with no obstructive CAD elsewhere. - She denies any recent chest pain. Continue ASA 81 mg daily, Plavix  75 mg daily (has been continued on both per her primary cardiologist), Toprol -XL 12.5 mg daily and Crestor  40 mg daily.   3. Bilateral carotid artery disease, unspecified type (HCC) - Carotid dopplers in 01/2023 showed plaque along the carotid arteries with less than 50% stenosis bilaterally. Continue ASA 81 mg daily and Crestor  40 mg daily.   4. Essential hypertension - BP is at 130/62 during today's visit. Continue current medical therapy with Amlodipine  10 mg daily, Clonidine  0.3 mg at bedtime and Toprol -XL 12.5 mg daily.   5. Hyperlipidemia LDL goal <70 - LDL was at 41 when checked in 03/2024. Continue current medical therapy  with Crestor  40 mg daily.  6. Palpitations - Her prior monitor in 2021 showed predominantly normal sinus rhythm with short bursts of SVT which was possibly due to an atrial tachycardia. Reviewed options with the patient in regards to a follow-up monitor and she wishes to hold off on this for now given her infrequent and short-lived symptoms. I encouraged her to make us  aware if symptoms increase in frequency or severity as we can arrange for a follow-up Zio patch. Continue on Toprol -XL 12.5 mg daily for now  Would not further titrate given her baseline heart rate in the 50's.  Signed, Laymon CHRISTELLA Qua, PA-C

## 2024-07-08 ENCOUNTER — Ambulatory Visit: Attending: Student | Admitting: Student

## 2024-07-08 ENCOUNTER — Encounter: Payer: Self-pay | Admitting: Student

## 2024-07-08 VITALS — BP 130/62 | HR 58 | Ht 64.0 in | Wt 136.4 lb

## 2024-07-08 DIAGNOSIS — I779 Disorder of arteries and arterioles, unspecified: Secondary | ICD-10-CM | POA: Diagnosis not present

## 2024-07-08 DIAGNOSIS — R0609 Other forms of dyspnea: Secondary | ICD-10-CM | POA: Diagnosis not present

## 2024-07-08 DIAGNOSIS — I2510 Atherosclerotic heart disease of native coronary artery without angina pectoris: Secondary | ICD-10-CM | POA: Diagnosis not present

## 2024-07-08 DIAGNOSIS — R002 Palpitations: Secondary | ICD-10-CM

## 2024-07-08 DIAGNOSIS — I10 Essential (primary) hypertension: Secondary | ICD-10-CM

## 2024-07-08 DIAGNOSIS — E785 Hyperlipidemia, unspecified: Secondary | ICD-10-CM

## 2024-07-08 NOTE — Patient Instructions (Signed)

## 2024-07-09 ENCOUNTER — Ambulatory Visit (INDEPENDENT_AMBULATORY_CARE_PROVIDER_SITE_OTHER): Payer: PRIVATE HEALTH INSURANCE | Admitting: Otolaryngology

## 2024-07-09 ENCOUNTER — Encounter (INDEPENDENT_AMBULATORY_CARE_PROVIDER_SITE_OTHER): Payer: Self-pay | Admitting: Otolaryngology

## 2024-07-09 VITALS — BP 139/69 | HR 54 | Temp 98.0°F

## 2024-07-09 DIAGNOSIS — R0609 Other forms of dyspnea: Secondary | ICD-10-CM | POA: Diagnosis not present

## 2024-07-09 DIAGNOSIS — R0602 Shortness of breath: Secondary | ICD-10-CM | POA: Diagnosis not present

## 2024-07-09 DIAGNOSIS — H905 Unspecified sensorineural hearing loss: Secondary | ICD-10-CM

## 2024-07-09 DIAGNOSIS — H903 Sensorineural hearing loss, bilateral: Secondary | ICD-10-CM

## 2024-07-09 NOTE — Progress Notes (Signed)
 ENT CONSULT:  Reason for Consult: dyspnea on exertion    HPI: Discussed the use of AI scribe software for clinical note transcription with the patient, who gave verbal consent to proceed.  History of Present Illness Tanya [name] is a 73 year old female who presents with shortness of breath with exertion.   She experiences long-standing shortness of breath with exertion and has pressure in her chest at times. She is unsure if it is related to exercise or other activities. She has previously consulted a cardiologist who indicated that her heart is fine.  She has difficulty with hearing and understanding conversations. She owns hearing aids but is unsure if they are functioning properly. She has not had a hearing test before and was advised to use hearing aids.   Records indicate she had flattening of inspiratory curve on flow-volume look during PFTs.    Records Reviewed:  Dr Darlean  73 yo [place] never smoker in [place]  since 1980 and new onset doe x 2015  and progressed since to point where can't even even walk 50 ft sometimes  Onset 2015 with variable CP  - PFT's  09/22/14   FEV1 2.17 (92 % ) ratio 0.83  p 9 % improvement from saba p 0 prior to study with DLCO  12.40 (54%) corrects to 3.95 (84%)  for alv volume and FV curve nl exp/ severe truncation insp loop   - LHC  04/24/2019  40% eccentric mid LAD in-stent restenosis.  At completion of stent procedure in September 2020, there was residual stent deformity in this region.  There is no high-grade obstruction in the LAD stent.  The LAD is otherwise normal. Normal left main Normal circumflex Normal RCA Normal LV systolic function and end-diastolic pressure - Echo 06/29/2019 nl x for   diastolic dysfunction  -  07/05/2020   Walked RA  approx   500 ft  @ very fast pace  stopped due to  End of study , min  sob ,  sats still 96%     - 04/06/2022   Walked on RA   x  3   lap(s) =  approx 450  ft  @ very fast  pace, stopped due to end of study   with lowest 02 sats 98% but did c/o upper abd discomfort ? Worse with ex   - Allergy screen 04/06/2022 >  Eos 0.2 /  IgE  564 >>> allergy referral > declined -03/20/24  RHC/ LHC ok x LVEDP  24    - 05/11/2024   Walked on RA  x  3  lap(s) =  approx 450  ft  @ fast pace, stopped due to end of study  with lowest 02 sats 99% s sob   - 05/11/2024 max gerd rx (no more mint) and ENT eval per [facility] for hearing and globus sensation       Past Medical History:  Diagnosis Date   Aneurysm    left ICA 09/2019   Anhedonia    Anxiety    Brain aneurysm    CAD (coronary artery disease)    a. s/p DESx2 to proximal LAD in 06/2018 b. cath in 04/2019 showing 40% mid-LAD ISR with no significant stenosis along LM, LCx, or RCA   Cystitis    Depression    Elevated liver enzymes    Fatty liver    GERD (gastroesophageal reflux disease)    Hard of hearing    Headache    migraine  Hyperlipidemia    Hypertension    Insomnia    Migraine    Osteoarthritis    Unspecified hypothyroidism    Vaginitis    Wears glasses    Wears hearing aid    B/L    Past Surgical History:  Procedure Laterality Date   BIOPSY N/A 01/18/2021   Procedure: BIOPSY;  Surgeon: Eartha Angelia Sieving, MD;  [facility];  Service: Gastroenterology;  Laterality: N/A;   BREAST BIOPSY Right    unsure when/benign   CATARACT EXTRACTION W/PHACO Right 05/08/2019   Procedure: CATARACT EXTRACTION PHACO AND INTRAOCULAR LENS PLACEMENT (IOC);  Surgeon: Harrie Agent, MD;  Location: [facility];  Service: Ophthalmology;  Laterality: Right;  CDE: 10.02   COLONOSCOPY N/A 02/08/2016   Procedure: COLONOSCOPY;  Surgeon: Claudis RAYMOND Rivet, MD;  Location: [facility];  Service: Endoscopy;  Laterality: N/A;  1200 - moved to 11:45 - Ann to notify   COLONOSCOPY WITH PROPOFOL  N/A 10/29/2023   Procedure: COLONOSCOPY WITH PROPOFOL ;  Surgeon: Eartha Angelia Sieving, MD;  [facility]: [facility];  Service: Gastroenterology;  Laterality: N/A;   10:45 AM, ASA 1   CORONARY PRESSURE/FFR STUDY N/A 06/06/2018   Procedure: INTRAVASCULAR PRESSURE WIRE/FFR STUDY;  Surgeon: Swaziland, [name], MD;  Location: [facility] INVASIVE CV LAB;  Service: Cardiovascular;  Laterality: N/A;   CORONARY PRESSURE/FFR STUDY N/A 03/20/2024   Procedure: CORONARY PRESSURE/FFR STUDY;  Surgeon: Wendel Lurena POUR, MD;  Location: [facility]VAS[facility] CV LAB;  Service: Cardiovascular;  Laterality: N/A;   CORONARY STENT INTERVENTION N/A 06/06/2018   Procedure: CORONARY STENT INTERVENTION;  Surgeon: Swaziland, [name], MD;  Location: [facility] INVASIVE CV LAB;  Service: Cardiovascular;  Laterality: N/A;   ESOPHAGOGASTRODUODENOSCOPY N/A 01/15/2024   Procedure: EGD (ESOPHAGOGASTRODUODENOSCOPY);  Surgeon: Eartha Angelia, Sieving, MD;  [facility];  Service: Gastroenterology;  Laterality: N/A;  7:30AM;ASA1-2   ESOPHAGOGASTRODUODENOSCOPY (EGD) WITH PROPOFOL  N/A 01/18/2021   Procedure: ESOPHAGOGASTRODUODENOSCOPY (EGD) WITH PROPOFOL ;  Surgeon: Eartha Angelia Sieving, MD;  [facility];  Service: Gastroenterology;  Laterality: N/A;  10:30AM   IR ANGIO INTRA EXTRACRAN SEL COM CAROTID INNOMINATE BILAT MOD SED  09/01/2019   IR ANGIO INTRA EXTRACRAN SEL INTERNAL CAROTID UNI L MOD SED  12/21/2019   IR ANGIO VERTEBRAL SEL VERTEBRAL BILAT MOD SED  09/01/2019   IR ANGIOGRAM FOLLOW UP STUDY  12/21/2019   IR TRANSCATH/EMBOLIZ  12/21/2019   LEFT HEART CATH AND CORONARY ANGIOGRAPHY N/A 04/24/2019   Procedure: LEFT HEART CATH AND CORONARY ANGIOGRAPHY;  Surgeon: Claudene Victory ORN, MD;  Location: [facility];  Service: Cardiovascular;  Laterality: N/A;   LEFT HEART CATHETERIZATION WITH CORONARY ANGIOGRAM N/A 11/19/2014   Procedure: LEFT HEART CATHETERIZATION WITH CORONARY ANGIOGRAM;  Surgeon: Alm ORN Clay, MD;  Location: [facility] CATH LAB;  Service: Cardiovascular;  Laterality: N/A;   POLYPECTOMY  10/29/2023   Procedure: POLYPECTOMY;  Surgeon: Eartha Angelia Sieving, MD;  [facility];  Service: Gastroenterology;;   RADIOLOGY WITH ANESTHESIA N/A 11/23/2019   Procedure: EATHER;  Surgeon: Dolphus Carrion, MD;  Location: [facility] OR;  Service: Radiology;  Laterality: N/A;   RADIOLOGY WITH ANESTHESIA N/A 12/21/2019   Procedure: EMBOLIZATION;  Surgeon: Dolphus Carrion, MD;  Location: [facility];  Service: Radiology;  Laterality: N/A;   RIGHT/LEFT HEART CATH AND CORONARY ANGIOGRAPHY N/A 06/06/2018   Procedure: RIGHT/LEFT HEART CATH AND CORONARY ANGIOGRAPHY;  Surgeon: Swaziland, [name], MD;  Location: [facility] INVASIVE CV LAB;  Service: Cardiovascular;  Laterality: N/A;   RIGHT/LEFT HEART CATH  AND CORONARY ANGIOGRAPHY N/A 03/20/2024   Procedure: RIGHT/LEFT HEART CATH AND CORONARY ANGIOGRAPHY;  Surgeon: Wendel Lurena POUR, MD;  Location: [facility]VAS[facility] CV LAB;  Service: Cardiovascular;  Laterality: N/A;    Family History  Problem Relation Age of Onset   Dementia Mother    Heart disease Mother        heart attack    Hypertension Mother    Heart attack Father    Heart disease Father        heart attack    Hypertension Father     Social History:  reports that she has never smoked. She has never been exposed to tobacco smoke. She has never used smokeless tobacco. She reports that she does not drink alcohol  and does not use drugs.  Allergies: No Known Allergies  Medications: I have reviewed the patient's current medications.  The PMH, PSH, Medications, Allergies, and SH were reviewed and updated.  ROS: Constitutional: Negative for fever, weight loss and weight gain. Cardiovascular: Negative for chest pain and dyspnea on exertion. Respiratory: Is not experiencing shortness of breath at rest. Gastrointestinal: Negative for nausea and vomiting. Neurological: Negative for headaches. Psychiatric: The patient is not nervous/anxious  Blood pressure 139/69, pulse (!) 54, temperature 98 F (36.7 C), SpO2 96%. There is no height or weight on file to calculate BMI.  PHYSICAL  EXAM:  Exam: General: Well-developed, well-nourished Respiratory Respiratory effort: Equal inspiration and expiration without stridor Cardiovascular Peripheral Vascular: Warm extremities with equal color/perfusion Eyes: No nystagmus with equal extraocular motion bilaterally Neuro/Psych/Balance: Patient oriented to person, place, and time; Appropriate mood and affect; Gait is intact with no imbalance; Cranial nerves I-XII are intact Head and Face Inspection: Normocephalic and atraumatic without mass or lesion Palpation: Facial skeleton intact without bony stepoffs Salivary Glands: No mass or tenderness Facial Strength: Facial motility symmetric and full bilaterally ENT Pinna: External ear intact and fully developed External canal: Canal is patent with intact skin Tympanic Membrane: Clear and mobile External Nose: No scar or anatomic deformity Internal Nose: Septum is straight. No polyp, or purulence. Mucosal edema and erythema present.  Bilateral inferior turbinate hypertrophy.  Lips, Teeth, and gums: Mucosa and teeth intact and viable TMJ: No pain to palpation with full mobility Oral cavity/oropharynx: No erythema or exudate, no lesions present Nasopharynx: No mass or lesion with intact mucosa Hypopharynx: Intact mucosa without pooling of secretions Larynx Glottic: Full true vocal cord mobility without lesion or mass Supraglottic: Normal appearing epiglottis and AE folds Interarytenoid Space: Moderate pachydermia&edema Subglottic Space: Patent without lesion or edema Neck Neck and Trachea: Midline trachea without mass or lesion Thyroid : No mass or nodularity Lymphatics: No lymphadenopathy  Procedure: Preoperative diagnosis: dyspnea on exertion upper airway evaluation required  Postoperative diagnosis:   Same  Procedure: Flexible fiberoptic laryngoscopy  Surgeon: Elena Larry, MD  Anesthesia: Topical lidocaine  and Afrin Complications: None Condition is stable  throughout exam  Indications and consent:  The patient presents to the clinic with above symptoms. Indirect laryngoscopy view was incomplete. Thus it was recommended that they undergo a flexible fiberoptic laryngoscopy. All of the risks, benefits, and potential complications were reviewed with the patient preoperatively and verbal informed consent was obtained.  Procedure: The patient was seated upright in the clinic. Topical lidocaine  and Afrin were applied to the nasal cavity. After adequate anesthesia had occurred, I then proceeded to pass the flexible telescope into the nasal cavity. The nasal cavity was patent without rhinorrhea or polyp. The nasopharynx was also patent without mass or  lesion. The base of tongue was visualized and was normal. There were no signs of pooling of secretions in the piriform sinuses. The true vocal folds were mobile bilaterally. There were no signs of glottic or supraglottic mucosal lesion or mass. There was moderate interarytenoid pachydermia and post cricoid edema. The telescope was then slowly withdrawn and the patient tolerated the procedure throughout.   Studies Reviewed: CT PE chest 07/26/23 1. No evidence of pulmonary embolus. 2. Similar mild right middle lobe bronchiectasis with new mucous plugging and clustered small solid nodules, likely due to chronic atypical infection or aspiration. Largest new solid nodule measures 3 mm. No follow-up needed if patient is low-risk  Assessment/Plan: Encounter Diagnoses  Name Primary?   Dyspnea on exertion Yes   Sensorineural hearing loss (SNHL) of both ears     Assessment and Plan Assessment & Plan Sensorineural hearing loss Hearing aids non-functional, not turned on. No recent audiometric evaluation. - Advised to see her Audiologist for repeat testing and inspection of her hearing aids.   Shortness of breath, chronic Long-standing shortness of breath  Flat inspiratory curve on flow-volume loop testing with  Pulm. Flexible scope exam without upper airway narrowing b/l VF were mobile and subglottis was patent.  - Recommend follow-up with primary care physician for further evaluation and management of shortness of breath and chest discomfort.      Thank you for allowing me to participate in the care of this patient. Please do not hesitate to contact me with any questions or concerns.   Elena Larry, MD Otolaryngology [facility] Phone: [phone] Fax: [phone]    07/09/2024, 10:26 AM

## 2024-08-06 ENCOUNTER — Encounter (INDEPENDENT_AMBULATORY_CARE_PROVIDER_SITE_OTHER): Payer: Self-pay | Admitting: Gastroenterology

## 2024-08-10 ENCOUNTER — Telehealth: Payer: Self-pay

## 2024-08-10 ENCOUNTER — Other Ambulatory Visit: Payer: Self-pay | Admitting: Family Medicine

## 2024-08-10 MED ORDER — CLOPIDOGREL BISULFATE 75 MG PO TABS: 75.0000 mg | ORAL_TABLET | Freq: Every day | ORAL | 0 refills | Status: DC

## 2024-08-10 NOTE — Telephone Encounter (Signed)
 Prescription Request  08/10/2024  LOV: Visit date not found  What is the name of the medication or equipment? clopidogrel  (PLAVIX ) 75 MG tablet wants 90 days supply   Have you contacted your pharmacy to request a refill? Yes   Which pharmacy would you like this sent to?   [facility] not mail order     Patient notified that their request is being sent to the clinical staff for review and that they should receive a response within 2 business days.   Please advise at Mobile [phone] (mobile)

## 2024-08-10 NOTE — Telephone Encounter (Signed)
 One monht only refilled based on review of most recent Cardiology note, she needs to reach out to cardiology office for plavix  prescription/ refills pls let her know

## 2024-08-10 NOTE — Telephone Encounter (Signed)
 Is asking for plavix  to be refilled but I can't tell if she should still be on it. Its listed as historical on her med list. Pls advise if ok to refill

## 2024-08-11 NOTE — Telephone Encounter (Signed)
 N/a, unable to lvm

## 2024-08-26 LAB — CMP14+EGFR
ALT: 16 IU/L (ref 0–32)
AST: 21 IU/L (ref 0–40)
Albumin: 4.4 g/dL (ref 3.8–4.8)
Alkaline Phosphatase: 68 IU/L (ref 49–135)
BUN/Creatinine Ratio: 21 (ref 12–28)
BUN: 15 mg/dL (ref 8–27)
Bilirubin Total: 0.3 mg/dL (ref 0.0–1.2)
CO2: 22 mmol/L (ref 20–29)
Calcium: 9.4 mg/dL (ref 8.7–10.3)
Chloride: 108 mmol/L — ABNORMAL HIGH (ref 96–106)
Creatinine, Ser: 0.7 mg/dL (ref 0.57–1.00)
Globulin, Total: 2.1 g/dL (ref 1.5–4.5)
Glucose: 102 mg/dL — ABNORMAL HIGH (ref 70–99)
Potassium: 4.2 mmol/L (ref 3.5–5.2)
Sodium: 142 mmol/L (ref 134–144)
Total Protein: 6.5 g/dL (ref 6.0–8.5)
eGFR: 91 mL/min/1.73 (ref >59)

## 2024-08-26 LAB — LIPID PANEL
Chol/HDL Ratio: 2 ratio (ref 0.0–4.4)
Cholesterol, Total: 127 mg/dL (ref 100–199)
HDL: 63 mg/dL (ref >39)
LDL Chol Calc (NIH): 53 mg/dL (ref 0–99)
Triglycerides: 45 mg/dL (ref 0–149)
VLDL Cholesterol Cal: 11 mg/dL (ref 5–40)

## 2024-08-30 ENCOUNTER — Other Ambulatory Visit: Payer: Self-pay | Admitting: Family Medicine

## 2024-08-30 DIAGNOSIS — G43019 Migraine without aura, intractable, without status migrainosus: Secondary | ICD-10-CM

## 2024-08-31 ENCOUNTER — Ambulatory Visit: Payer: Self-pay | Admitting: Family Medicine

## 2024-09-03 ENCOUNTER — Ambulatory Visit: Admitting: Family Medicine

## 2024-09-03 ENCOUNTER — Encounter: Payer: Self-pay | Admitting: Family Medicine

## 2024-09-03 ENCOUNTER — Other Ambulatory Visit (HOSPITAL_COMMUNITY): Payer: Self-pay | Admitting: Family Medicine

## 2024-09-03 VITALS — BP 129/75 | HR 62 | Resp 18 | Ht 64.0 in | Wt 144.0 lb

## 2024-09-03 DIAGNOSIS — F5104 Psychophysiologic insomnia: Secondary | ICD-10-CM

## 2024-09-03 DIAGNOSIS — R7302 Impaired glucose tolerance (oral): Secondary | ICD-10-CM

## 2024-09-03 DIAGNOSIS — E7849 Other hyperlipidemia: Secondary | ICD-10-CM

## 2024-09-03 DIAGNOSIS — E559 Vitamin D deficiency, unspecified: Secondary | ICD-10-CM

## 2024-09-03 DIAGNOSIS — Z1231 Encounter for screening mammogram for malignant neoplasm of breast: Secondary | ICD-10-CM

## 2024-09-03 DIAGNOSIS — I10 Essential (primary) hypertension: Secondary | ICD-10-CM

## 2024-09-03 MED ORDER — CLOPIDOGREL BISULFATE 75 MG PO TABS: 75.0000 mg | ORAL_TABLET | Freq: Every day | ORAL | 3 refills | Status: DC

## 2024-09-03 NOTE — Patient Instructions (Addendum)
 Annual exam June 5 or after  Please schedule mammogram due in March  at checkout   Excellent labs, cut back on candy please   Nurse please verify that she does need covid vaccine  and advise her   Fasting CBC, lipid, cmp and EGFr, TSH and Vit D  1 week before next c visit  It is important that you exercise regularly at least 30 minutes 5 times a week. If you develop chest pain, have severe difficulty breathing, or feel very tired, stop exercising immediately and seek medical attention    Think about what you will eat, plan ahead. Choose  clean, green, fresh or frozen over canned, processed or packaged foods which are more sugary, salty and fatty. 70 to 75% of food eaten should be vegetables and fruit. Three meals at set times with snacks allowed between meals, but they must be fruit or vegetables. Aim to eat over a 12 hour period , example 7 am to 7 pm, and STOP after  your last meal of the day. Drink water ,generally about 64 ounces per day, no other drink is as healthy. Fruit juice is best enjoyed in a healthy way, by EATING the fruit.  Thanks for choosing [facility], we consider it a privelige to serve you.

## 2024-09-06 ENCOUNTER — Encounter: Payer: Self-pay | Admitting: Family Medicine

## 2024-09-06 MED ORDER — TEMAZEPAM 15 MG PO CAPS: 15.0000 mg | ORAL_CAPSULE | Freq: Every day | ORAL | 5 refills | Status: DC

## 2024-09-06 NOTE — Assessment & Plan Note (Signed)
 Patient educated about the importance of limiting  Carbohydrate intake , the need to commit to daily physical activity for a minimum of 30 minutes , and to commit weight loss. The fact that changes in all these areas will reduce or eliminate all together the development of diabetes is stressed.      Latest Ref Rng & Units 08/25/2024   10:28 AM 03/10/2024   12:29 PM 03/04/2024    9:26 AM 09/16/2023   10:50 AM 07/26/2023    1:17 PM  Diabetic Labs  Chol 100 - 199 mg/dL 872   875  873    HDL >60 mg/dL 63   70  68    Calc LDL 0 - 99 mg/dL 53   41  46    Triglycerides 0 - 149 mg/dL 45   57  55    Creatinine 0.57 - 1.00 mg/dL 9.29  9.50  9.41  9.26  0.70       09/03/2024   10:44 AM 07/09/2024    9:42 AM 07/08/2024   10:08 AM 05/11/2024    8:49 AM 04/30/2024   10:43 AM 03/20/2024    3:00 PM 03/20/2024    2:30 PM  BP/Weight  Systolic BP 129 139 130 145 127 132 127  Diastolic BP 75 69 62 69 61 56 78  Wt. (Lbs) 144  136.4 134.6 135.08    BMI 24.72 kg/m2  23.41 kg/m2 23.1 kg/m2 23.19 kg/m2         No data to display          Updated lab needed at/ before next visit.

## 2024-09-06 NOTE — Assessment & Plan Note (Signed)
 Updated lab needed at/ before next visit.

## 2024-09-06 NOTE — Assessment & Plan Note (Signed)
 Controlled, no change in medication DASH diet and commitment to daily physical activity for a minimum of 30 minutes discussed and encouraged, as a part of hypertension management. The importance of attaining a healthy weight is also discussed.     09/03/2024   10:44 AM 07/09/2024    9:42 AM 07/08/2024   10:08 AM 05/11/2024    8:49 AM 04/30/2024   10:43 AM 03/20/2024    3:00 PM 03/20/2024    2:30 PM  BP/Weight  Systolic BP 129 139 130 145 127 132 127  Diastolic BP 75 69 62 69 61 56 78  Wt. (Lbs) 144  136.4 134.6 135.08    BMI 24.72 kg/m2  23.41 kg/m2 23.1 kg/m2 23.19 kg/m2

## 2024-09-06 NOTE — Assessment & Plan Note (Signed)
 Controlled, no change in medication

## 2024-09-06 NOTE — Progress Notes (Signed)
 Tanya [name]     MRN: [id number]      DOB: 02-05-1951  Chief Complaint  Patient presents with   Hypertension    4 month follow up     HPI Tanya [name] is here for follow up and re-evaluation of chronic medical conditions, medication management and review of any available recent lab and radiology data.  Preventive health is updated, specifically  Cancer screening and Immunization.   Questions or concerns regarding consultations or procedures which the PT has had in the interim are  addressed. The PT denies any adverse reactions to current medications since the last visit.  There are no new concerns.  There are no specific complaints   ROS Denies recent fever or chills. Denies sinus pressure, nasal congestion, ear pain or sore throat. Denies chest congestion, productive cough or wheezing. Denies chest pains, palpitations and leg swelling Denies abdominal pain, nausea, vomiting,diarrhea or constipation.   Denies dysuria, frequency, hesitancy or incontinence. Denies joint pain, swelling and limitation in mobility. Denies headaches, seizures, numbness, or tingling. Denies depression, anxiety or insomnia. Denies skin break down or rash.   PE  BP 129/75   Pulse 62   Resp 18   Ht 5' 4 (1.626 m)   Wt 144 lb (65.3 kg)   SpO2 98%   BMI 24.72 kg/m   Patient alert and oriented and in no cardiopulmonary distress.  HEENT: No facial asymmetry, EOMI,     Neck supple .  Chest: Clear to auscultation bilaterally.  CVS: S1, S2 no murmurs, no S3.Regular rate.  ABD: Soft non tender.   Ext: No edema  MS: Adequate ROM spine, shoulders, hips and knees.  Skin: Intact, no ulcerations or rash noted.  Psych: Good eye contact, normal affect. Memory intact not anxious or depressed appearing.  CNS: CN 2-12 intact, power,  normal throughout.no focal deficits noted.   Assessment & Plan  Essential hypertension Controlled, no change in medication DASH diet and commitment to daily  physical activity for a minimum of 30 minutes discussed and encouraged, as a part of hypertension management. The importance of attaining a healthy weight is also discussed.     09/03/2024   10:44 AM 07/09/2024    9:42 AM 07/08/2024   10:08 AM 05/11/2024    8:49 AM 04/30/2024   10:43 AM 03/20/2024    3:00 PM 03/20/2024    2:30 PM  BP/Weight  Systolic BP 129 139 130 145 127 132 127  Diastolic BP 75 69 62 69 61 56 78  Wt. (Lbs) 144  136.4 134.6 135.08    BMI 24.72 kg/m2  23.41 kg/m2 23.1 kg/m2 23.19 kg/m2         Insomnia Controlled, no change in medication   IGT (impaired glucose tolerance) Patient educated about the importance of limiting  Carbohydrate intake , the need to commit to daily physical activity for a minimum of 30 minutes , and to commit weight loss. The fact that changes in all these areas will reduce or eliminate all together the development of diabetes is stressed.      Latest Ref Rng & Units 08/25/2024   10:28 AM 03/10/2024   12:29 PM 03/04/2024    9:26 AM 09/16/2023   10:50 AM 07/26/2023    1:17 PM  Diabetic Labs  Chol 100 - 199 mg/dL 872   875  873    HDL >60 mg/dL 63   70  68    Calc LDL 0 - 99 mg/dL 53  41  46    Triglycerides 0 - 149 mg/dL 45   57  55    Creatinine 0.57 - 1.00 mg/dL 9.29  9.50  9.41  9.26  0.70       09/03/2024   10:44 AM 07/09/2024    9:42 AM 07/08/2024   10:08 AM 05/11/2024    8:49 AM 04/30/2024   10:43 AM 03/20/2024    3:00 PM 03/20/2024    2:30 PM  BP/Weight  Systolic BP 129 139 130 145 127 132 127  Diastolic BP 75 69 62 69 61 56 78  Wt. (Lbs) 144  136.4 134.6 135.08    BMI 24.72 kg/m2  23.41 kg/m2 23.1 kg/m2 23.19 kg/m2         No data to display          Updated lab needed at/ before next visit.   Vitamin D  deficiency Updated lab needed at/ before next visit.   Hyperlipemia Hyperlipidemia:Low fat diet discussed and encouraged.   Lipid Panel  Lab Results  Component Value Date   CHOL 127 08/25/2024   HDL 63  08/25/2024   LDLCALC 53 08/25/2024   TRIG 45 08/25/2024   CHOLHDL 2.0 08/25/2024    Controlled, no change in medication  Updated lab needed at/ before next visit.

## 2024-09-06 NOTE — Assessment & Plan Note (Signed)
 Hyperlipidemia:Low fat diet discussed and encouraged.   Lipid Panel  Lab Results  Component Value Date   CHOL 127 08/25/2024   HDL 63 08/25/2024   LDLCALC 53 08/25/2024   TRIG 45 08/25/2024   CHOLHDL 2.0 08/25/2024    Controlled, no change in medication  Updated lab needed at/ before next visit.

## 2024-09-30 ENCOUNTER — Other Ambulatory Visit: Payer: Self-pay

## 2024-09-30 MED ORDER — CLOPIDOGREL BISULFATE 75 MG PO TABS: 75.0000 mg | ORAL_TABLET | Freq: Every day | ORAL | 3 refills | Status: AC

## 2024-10-05 ENCOUNTER — Other Ambulatory Visit: Payer: Self-pay | Admitting: Family Medicine

## 2024-10-05 ENCOUNTER — Telehealth: Payer: Self-pay | Admitting: Family Medicine

## 2024-10-05 NOTE — Telephone Encounter (Signed)
 Patient needing temazepam  (RESTORIL ) 15 MG capsule [[id number]] sent to [facility] in [place]. Please advise Thank you

## 2024-10-06 MED ORDER — TEMAZEPAM 15 MG PO CAPS: 15.0000 mg | ORAL_CAPSULE | Freq: Every day | ORAL | 5 refills | Status: AC

## 2024-10-06 NOTE — Telephone Encounter (Signed)
 Medication is refilled.Marland KitchenMarland Kitchen

## 2024-12-02 ENCOUNTER — Ambulatory Visit: Payer: Self-pay

## 2024-12-21 ENCOUNTER — Ambulatory Visit (HOSPITAL_COMMUNITY)

## 2024-12-29 ENCOUNTER — Ambulatory Visit: Admitting: Cardiology

## 2025-03-05 ENCOUNTER — Encounter: Admitting: Family Medicine
